# Patient Record
Sex: Female | Born: 1975 | Race: White | Hispanic: No | Marital: Married | Smoking: Current every day smoker
Health system: Southern US, Community
[De-identification: ages and names within clinical notes are randomized; demographics above are authoritative.]

## PROBLEM LIST (undated history)

## (undated) DIAGNOSIS — J449 Chronic obstructive pulmonary disease, unspecified: Secondary | ICD-10-CM

## (undated) DIAGNOSIS — J189 Pneumonia, unspecified organism: Secondary | ICD-10-CM

## (undated) DIAGNOSIS — K7460 Unspecified cirrhosis of liver: Secondary | ICD-10-CM

## (undated) DIAGNOSIS — S8990XA Unspecified injury of unspecified lower leg, initial encounter: Secondary | ICD-10-CM

## (undated) DIAGNOSIS — M1990 Unspecified osteoarthritis, unspecified site: Secondary | ICD-10-CM

## (undated) DIAGNOSIS — F419 Anxiety disorder, unspecified: Secondary | ICD-10-CM

## (undated) DIAGNOSIS — D509 Iron deficiency anemia, unspecified: Secondary | ICD-10-CM

## (undated) DIAGNOSIS — G600 Hereditary motor and sensory neuropathy: Secondary | ICD-10-CM

## (undated) DIAGNOSIS — K3182 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: Secondary | ICD-10-CM

## (undated) DIAGNOSIS — K759 Inflammatory liver disease, unspecified: Secondary | ICD-10-CM

## (undated) DIAGNOSIS — Z972 Presence of dental prosthetic device (complete) (partial): Secondary | ICD-10-CM

## (undated) DIAGNOSIS — K219 Gastro-esophageal reflux disease without esophagitis: Secondary | ICD-10-CM

## (undated) DIAGNOSIS — F1010 Alcohol abuse, uncomplicated: Secondary | ICD-10-CM

## (undated) DIAGNOSIS — D696 Thrombocytopenia, unspecified: Secondary | ICD-10-CM

## (undated) DIAGNOSIS — S3992XA Unspecified injury of lower back, initial encounter: Secondary | ICD-10-CM

## (undated) DIAGNOSIS — C539 Malignant neoplasm of cervix uteri, unspecified: Secondary | ICD-10-CM

## (undated) DIAGNOSIS — S88919A Complete traumatic amputation of unspecified lower leg, level unspecified, initial encounter: Secondary | ICD-10-CM

## (undated) DIAGNOSIS — Z8489 Family history of other specified conditions: Secondary | ICD-10-CM

## (undated) DIAGNOSIS — E876 Hypokalemia: Secondary | ICD-10-CM

## (undated) DIAGNOSIS — I10 Essential (primary) hypertension: Secondary | ICD-10-CM

## (undated) HISTORY — PX: BACK SURGERY: SHX140

## (undated) HISTORY — DX: 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: K31.82

## (undated) HISTORY — PX: LEG SURGERY: SHX1003

## (undated) HISTORY — DX: Unspecified injury of lower back, initial encounter: S39.92XA

## (undated) HISTORY — DX: Hereditary motor and sensory neuropathy: G60.0

## (undated) HISTORY — PX: JOINT REPLACEMENT: SHX530

## (undated) HISTORY — DX: Malignant neoplasm of cervix uteri, unspecified: C53.9

## (undated) HISTORY — DX: Unspecified injury of unspecified lower leg, initial encounter: S89.90XA

---

## 2008-11-21 DIAGNOSIS — Z823 Family history of stroke: Secondary | ICD-10-CM | POA: Insufficient documentation

## 2008-11-21 DIAGNOSIS — Z833 Family history of diabetes mellitus: Secondary | ICD-10-CM | POA: Insufficient documentation

## 2012-10-20 DIAGNOSIS — G988 Other disorders of nervous system: Secondary | ICD-10-CM | POA: Insufficient documentation

## 2013-05-10 DIAGNOSIS — R269 Unspecified abnormalities of gait and mobility: Secondary | ICD-10-CM | POA: Insufficient documentation

## 2013-06-08 HISTORY — PX: BACK SURGERY: SHX140

## 2013-08-28 DIAGNOSIS — M179 Osteoarthritis of knee, unspecified: Secondary | ICD-10-CM | POA: Insufficient documentation

## 2013-08-28 DIAGNOSIS — M1710 Unilateral primary osteoarthritis, unspecified knee: Secondary | ICD-10-CM | POA: Insufficient documentation

## 2014-04-28 DIAGNOSIS — S2241XA Multiple fractures of ribs, right side, initial encounter for closed fracture: Secondary | ICD-10-CM | POA: Insufficient documentation

## 2014-04-28 DIAGNOSIS — S32001A Stable burst fracture of unspecified lumbar vertebra, initial encounter for closed fracture: Secondary | ICD-10-CM | POA: Insufficient documentation

## 2014-04-28 DIAGNOSIS — S82201B Unspecified fracture of shaft of right tibia, initial encounter for open fracture type I or II: Secondary | ICD-10-CM | POA: Insufficient documentation

## 2014-04-28 DIAGNOSIS — F1210 Cannabis abuse, uncomplicated: Secondary | ICD-10-CM | POA: Insufficient documentation

## 2014-04-28 DIAGNOSIS — S2220XA Unspecified fracture of sternum, initial encounter for closed fracture: Secondary | ICD-10-CM | POA: Insufficient documentation

## 2014-05-07 DIAGNOSIS — T07XXXA Unspecified multiple injuries, initial encounter: Secondary | ICD-10-CM | POA: Insufficient documentation

## 2016-03-20 DIAGNOSIS — T847XXA Infection and inflammatory reaction due to other internal orthopedic prosthetic devices, implants and grafts, initial encounter: Secondary | ICD-10-CM | POA: Insufficient documentation

## 2016-05-01 DIAGNOSIS — T8484XA Pain due to internal orthopedic prosthetic devices, implants and grafts, initial encounter: Secondary | ICD-10-CM | POA: Insufficient documentation

## 2016-06-08 HISTORY — PX: BACK SURGERY: SHX140

## 2018-10-02 ENCOUNTER — Other Ambulatory Visit: Payer: Self-pay

## 2018-10-02 ENCOUNTER — Emergency Department
Admission: EM | Admit: 2018-10-02 | Discharge: 2018-10-02 | Disposition: A | Discharge status: Home / Self Care | Payer: Medicaid Other | Attending: Emergency Medicine | Admitting: Emergency Medicine

## 2018-10-02 ENCOUNTER — Encounter: Payer: Self-pay | Admitting: Emergency Medicine

## 2018-10-02 DIAGNOSIS — F1010 Alcohol abuse, uncomplicated: Secondary | ICD-10-CM | POA: Diagnosis not present

## 2018-10-02 DIAGNOSIS — Z789 Other specified health status: Secondary | ICD-10-CM

## 2018-10-02 DIAGNOSIS — Z7289 Other problems related to lifestyle: Secondary | ICD-10-CM

## 2018-10-02 DIAGNOSIS — F1090 Alcohol use, unspecified, uncomplicated: Secondary | ICD-10-CM

## 2018-10-02 DIAGNOSIS — F17200 Nicotine dependence, unspecified, uncomplicated: Secondary | ICD-10-CM | POA: Diagnosis not present

## 2018-10-02 DIAGNOSIS — R748 Abnormal levels of other serum enzymes: Secondary | ICD-10-CM | POA: Diagnosis not present

## 2018-10-02 DIAGNOSIS — I10 Essential (primary) hypertension: Secondary | ICD-10-CM | POA: Diagnosis not present

## 2018-10-02 DIAGNOSIS — R251 Tremor, unspecified: Secondary | ICD-10-CM | POA: Diagnosis present

## 2018-10-02 HISTORY — DX: Essential (primary) hypertension: I10

## 2018-10-02 LAB — CBC WITH DIFFERENTIAL/PLATELET
Abs Immature Granulocytes: 0.02 10*3/uL (ref 0.00–0.07)
Basophils Absolute: 0.1 10*3/uL (ref 0.0–0.1)
Basophils Relative: 1 %
Eosinophils Absolute: 0 10*3/uL (ref 0.0–0.5)
Eosinophils Relative: 0 %
HCT: 38.2 % (ref 36.0–46.0)
Hemoglobin: 13.2 g/dL (ref 12.0–15.0)
Immature Granulocytes: 0 %
Lymphocytes Relative: 33 %
Lymphs Abs: 1.6 10*3/uL (ref 0.7–4.0)
MCH: 33.1 pg (ref 26.0–34.0)
MCHC: 34.6 g/dL (ref 30.0–36.0)
MCV: 95.7 fL (ref 80.0–100.0)
Monocytes Absolute: 0.2 10*3/uL (ref 0.1–1.0)
Monocytes Relative: 5 %
Neutro Abs: 2.9 10*3/uL (ref 1.7–7.7)
Neutrophils Relative %: 61 %
Platelets: 93 10*3/uL — ABNORMAL LOW (ref 150–400)
RBC: 3.99 MIL/uL (ref 3.87–5.11)
RDW: 18.9 % — ABNORMAL HIGH (ref 11.5–15.5)
WBC: 4.9 10*3/uL (ref 4.0–10.5)
nRBC: 0 % (ref 0.0–0.2)

## 2018-10-02 LAB — URINALYSIS, COMPLETE (UACMP) WITH MICROSCOPIC
Bacteria, UA: NONE SEEN
Bilirubin Urine: NEGATIVE
Glucose, UA: NEGATIVE mg/dL
Ketones, ur: 5 mg/dL — AB
Leukocytes,Ua: NEGATIVE
Nitrite: NEGATIVE
Protein, ur: 100 mg/dL — AB
Specific Gravity, Urine: 1.017 (ref 1.005–1.030)
pH: 6 (ref 5.0–8.0)

## 2018-10-02 LAB — COMPREHENSIVE METABOLIC PANEL
ALT: 92 U/L — ABNORMAL HIGH (ref 0–44)
AST: 405 U/L — ABNORMAL HIGH (ref 15–41)
Albumin: 3.6 g/dL (ref 3.5–5.0)
Alkaline Phosphatase: 222 U/L — ABNORMAL HIGH (ref 38–126)
Anion gap: 16 — ABNORMAL HIGH (ref 5–15)
BUN: 6 mg/dL (ref 6–20)
CO2: 22 mmol/L (ref 22–32)
Calcium: 7.5 mg/dL — ABNORMAL LOW (ref 8.9–10.3)
Chloride: 97 mmol/L — ABNORMAL LOW (ref 98–111)
Creatinine, Ser: 0.33 mg/dL — ABNORMAL LOW (ref 0.44–1.00)
GFR calc Af Amer: >60 mL/min (ref >60)
GFR calc non Af Amer: >60 mL/min (ref >60)
Glucose, Bld: 117 mg/dL — ABNORMAL HIGH (ref 70–99)
Potassium: 3.7 mmol/L (ref 3.5–5.1)
Sodium: 135 mmol/L (ref 135–145)
Total Bilirubin: 1.9 mg/dL — ABNORMAL HIGH (ref 0.3–1.2)
Total Protein: 7.9 g/dL (ref 6.5–8.1)

## 2018-10-02 LAB — ETHANOL: Alcohol, Ethyl (B): 219 mg/dL — ABNORMAL HIGH (ref <10)

## 2018-10-02 LAB — URINE DRUG SCREEN, QUALITATIVE (ARMC ONLY)
Amphetamines, Ur Screen: NOT DETECTED
Barbiturates, Ur Screen: NOT DETECTED
Benzodiazepine, Ur Scrn: NOT DETECTED
Cannabinoid 50 Ng, Ur ~~LOC~~: NOT DETECTED
Cocaine Metabolite,Ur ~~LOC~~: NOT DETECTED
MDMA (Ecstasy)Ur Screen: NOT DETECTED
Methadone Scn, Ur: NOT DETECTED
Opiate, Ur Screen: POSITIVE — AB
Phencyclidine (PCP) Ur S: NOT DETECTED
Tricyclic, Ur Screen: NOT DETECTED

## 2018-10-02 LAB — POCT PREGNANCY, URINE: Preg Test, Ur: NEGATIVE

## 2018-10-02 LAB — GLUCOSE, CAPILLARY: Glucose-Capillary: 127 mg/dL — ABNORMAL HIGH (ref 70–99)

## 2018-10-02 MED ORDER — SODIUM CHLORIDE 0.9 % IV BOLUS
1000.0000 mL | Freq: Once | INTRAVENOUS | Status: AC
  Administered 2018-10-02: 1000 mL via INTRAVENOUS

## 2018-10-02 NOTE — ED Notes (Signed)
Lab called and stated red top had hemolyzed- redrew it and sent to lab

## 2018-10-02 NOTE — ED Notes (Signed)
Pt tremors have minimized

## 2018-10-02 NOTE — ED Triage Notes (Signed)
Pt to ED via POV c/o tremors that started this morning around 1130. Pt denies drug use. Pt states that she just "feels horrible". Pt states that she has hx/o HTN.

## 2018-10-02 NOTE — ED Provider Notes (Signed)
[facility] Emergency Department Provider Note  ____________________________________________   I have reviewed the triage vital signs and the nursing notes.   HISTORY  Chief Complaint Shaking   History limited by: Not Limited   HPI Rhonda Martin is a 43 y.o. female who presents to the emergency department today because of concern for uncontrollable shaking. The patient states that the shaking has happened a couple of times in the past but today was severe. She is unsure if it is related to anxiety. At one point she had been on a benzo for anxiety but took herself off of it given concern for addiction. She also states that she has been drinking roughly a pint of alcohol a day. She denies any recent illness or fever. At the time of my exam the patient feels like the shaking has improved.   Records reviewed. Per medical record review patient has a history of hypertension.   Past Medical History:  Diagnosis Date  . Hypertension     There are no active problems to display for this patient.   Past Surgical History:  Procedure Laterality Date  . BACK SURGERY      Prior to Admission medications   Not on File    Allergies Patient has no known allergies.  No family history on file.  Social History Social History   Tobacco Use  . Smoking status: Current Every Day Smoker  . Smokeless tobacco: Never Used  Substance Use Topics  . Alcohol use: Yes    Comment: 1 pint per day drinker. Last drink around 0300  . Drug use: Not Currently    Review of Systems Constitutional: No fever/chills Eyes: No visual changes. ENT: No sore throat. Cardiovascular: Denies chest pain. Respiratory: Denies shortness of breath. Gastrointestinal: No abdominal pain.  No nausea, no vomiting.  No diarrhea.  Genitourinary: Negative for dysuria. Musculoskeletal: Negative for back pain. Skin: Negative for rash. Neurological: Positive for generalized shaking.   ____________________________________________   PHYSICAL EXAM:  VITAL SIGNS: ED Triage Vitals  Enc Vitals Group     BP 10/02/18 1625 (!) 160/99     Pulse Rate 10/02/18 1625 (!) 122     Resp 10/02/18 1625 (!) 21     Temp --      Temp src --      SpO2 10/02/18 1625 98 %     Weight --      Height --      Head Circumference --      Peak Flow --      Pain Score 10/02/18 1622 0   Constitutional: Alert and oriented.  Eyes: Conjunctivae are normal.  ENT      Head: Normocephalic and atraumatic.      Nose: No congestion/rhinnorhea.      Mouth/Throat: Mucous membranes are moist.      Neck: No stridor. Hematological/Lymphatic/Immunilogical: No cervical lymphadenopathy. Cardiovascular: Normal rate, regular rhythm.  No murmurs, rubs, or gallops.  Respiratory: Normal respiratory effort without tachypnea nor retractions. Breath sounds are clear and equal bilaterally. No wheezes/rales/rhonchi. Gastrointestinal: Soft and non tender. No rebound. No guarding.  Genitourinary: Deferred Musculoskeletal: Normal range of motion in all extremities. No lower extremity edema. Neurologic:  Normal speech and language. No gross focal neurologic deficits are appreciated.  Skin:  Skin is warm, dry and intact. No rash noted. Psychiatric: Mood and affect are normal. Speech and behavior are normal. Patient exhibits appropriate insight and judgment.  ____________________________________________    LABS (pertinent positives/negatives)  POCT  urine negative CMP na 135, k 3.7, cl 97, cr 0.33, ca 7.5, ast 405, alt 92, alk phos 222, t bili 1.9 UDS positive opiate Upreg negative Ethanol 219 UA small hgb dipstick, 0-5 rbc and wbc CBC wbc 4.9, hgb 13.2, plt 93 ____________________________________________   EKG  None  ____________________________________________     RADIOLOGY  None  ____________________________________________   PROCEDURES  Procedures  ____________________________________________   INITIAL IMPRESSION / ASSESSMENT AND PLAN / ED COURSE  Pertinent labs & imaging results that were available during my care of the patient were reviewed by me and considered in my medical decision making (see chart for details).   Patient presented to the emergency department today because of concerns for full body shaking that started today although she has had similar episodes in the past.  By the time my exam there is no tremors or shaking.  She does state that she thinks might be related to anxiety and also states she has had a fairly significant alcohol intake recently.  Blood work does show elevated liver enzymes.  I did discussion with the patient about this.  I do have concern this is secondary to her alcohol use.  Did encourage cessation.  Discussed importance of GI follow-up.  Patient's heart rate initially was tachycardic although that improved during her stay emergency department and with IV fluids.  At this point I doubt significant neurologic process.  Will discharge.  ____________________________________________   FINAL CLINICAL IMPRESSION(S) / ED DIAGNOSES  Final diagnoses:  Shaking  Elevated liver enzymes     Note: This dictation was prepared with Dragon dictation. Any transcriptional errors that result from this process are unintentional     Nance Pear, MD 10/02/18 [id number]

## 2018-10-02 NOTE — Discharge Instructions (Addendum)
As we discussed it is important that you see the GI doctor to discuss your elevated liver values. Please stop using alcohol. Please seek medical attention for any high fevers, chest pain, shortness of breath, change in behavior, persistent vomiting, bloody stool or any other new or concerning symptoms.
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
## 2018-10-02 NOTE — ED Notes (Signed)
Assisted pt to void

## 2018-11-23 ENCOUNTER — Ambulatory Visit: Payer: Self-pay | Admitting: *Deleted

## 2018-11-23 NOTE — Telephone Encounter (Signed)
New pt appt scheduled.

## 2018-11-23 NOTE — Telephone Encounter (Signed)
New to the area, not established. Noticed rectal bleeding last night. Normal stool yesterday with no abdominal pain/N/V/fever. Hx diverticulitis and ulcers. Was under the care of Gastroenterologist in [place]. Endoscopy and Colonoscopy in 2011. Seeking to establish in this area for future healthcare. Transferred to [facility] for appointment. Denies any travels/no known exposures.  Reason for Disposition . MILD rectal bleeding (more than just a few drops or streaks)  Answer Assessment - Initial Assessment Questions 1. APPEARANCE of BLOOD: "What color is it?" "Is it passed separately, on the surface of the stool, or mixed in with the stool?"      Bright red blood on tissue after BM last night.  2. AMOUNT: "How much blood was passed?"      Fair amount on nice 3. FREQUENCY: "How many times has blood been passed with the stools?"      Once last night. Several times before over the last few months. 4. ONSET: "When was the blood first seen in the stools?" (Days or weeks)       5. DIARRHEA: "Is there also some diarrhea?" If so, ask: "How many diarrhea stools were passed in past 24 hours?"      Hard stool last night 6. CONSTIPATION: "Do you have constipation?" If so, "How bad is it?"    Mild constipation 7. RECURRENT SYMPTOMS: "Have you had blood in your stools before?" If so, ask: "When was the last time?" and "What happened that time?"      Yes, about one month ago 8. BLOOD THINNERS: "Do you take any blood thinners?" (e.g., Coumadin/warfarin, Pradaxa/dabigatran, aspirin)     no 9. OTHER SYMPTOMS: "Do you have any other symptoms?"  (e.g., abdominal pain, vomiting, dizziness, fever)     No abdominal pain, no n/v 10. PREGNANCY: "Is there any chance you are pregnant?" "When was your last menstrual period?"       no  Protocols used: RECTAL BLEEDING-A-AH

## 2018-11-30 ENCOUNTER — Other Ambulatory Visit: Payer: Self-pay

## 2018-11-30 ENCOUNTER — Encounter: Payer: Self-pay | Admitting: Family Medicine

## 2018-11-30 ENCOUNTER — Ambulatory Visit: Payer: Medicaid Other | Admitting: Family Medicine

## 2018-11-30 VITALS — BP 127/83 | HR 99 | Temp 98.5°F | Ht 61.5 in | Wt 118.0 lb

## 2018-11-30 DIAGNOSIS — Z8719 Personal history of other diseases of the digestive system: Secondary | ICD-10-CM

## 2018-11-30 DIAGNOSIS — Z7689 Persons encountering health services in other specified circumstances: Secondary | ICD-10-CM | POA: Diagnosis not present

## 2018-11-30 DIAGNOSIS — G600 Hereditary motor and sensory neuropathy: Secondary | ICD-10-CM

## 2018-11-30 DIAGNOSIS — R7989 Other specified abnormal findings of blood chemistry: Secondary | ICD-10-CM

## 2018-11-30 DIAGNOSIS — K921 Melena: Secondary | ICD-10-CM

## 2018-11-30 DIAGNOSIS — D696 Thrombocytopenia, unspecified: Secondary | ICD-10-CM

## 2018-11-30 DIAGNOSIS — Z8711 Personal history of peptic ulcer disease: Secondary | ICD-10-CM

## 2018-11-30 DIAGNOSIS — M25561 Pain in right knee: Secondary | ICD-10-CM

## 2018-11-30 DIAGNOSIS — R1030 Lower abdominal pain, unspecified: Secondary | ICD-10-CM

## 2018-11-30 DIAGNOSIS — G8929 Other chronic pain: Secondary | ICD-10-CM

## 2018-11-30 DIAGNOSIS — R945 Abnormal results of liver function studies: Secondary | ICD-10-CM

## 2018-11-30 MED ORDER — GABAPENTIN 300 MG PO CAPS: ORAL_CAPSULE | ORAL | 0 refills | Status: DC

## 2018-11-30 MED ORDER — PANTOPRAZOLE SODIUM 40 MG PO TBEC: 40.0000 mg | DELAYED_RELEASE_TABLET | Freq: Every day | ORAL | 3 refills | Status: DC

## 2018-11-30 MED ORDER — SUCRALFATE 1 G PO TABS: 1.0000 g | ORAL_TABLET | Freq: Three times a day (TID) | ORAL | 1 refills | Status: DC

## 2018-11-30 NOTE — Progress Notes (Signed)
BP 127/83   Pulse 99   Temp 98.5 F (36.9 C) (Oral)   Ht 5' 1.5" (1.562 m)   Wt 118 lb (53.5 kg)   SpO2 98%   BMI 21.93 kg/m    Subjective:    [name], female    DOB: 20-Jul-1975, 43 y.o.   MRN: [id number]  HPI: Rhonda Martin is a 43 y.o. female  Chief Complaint  Patient presents with  . Establish Care  . Abdominal Pain    lower abdomen. states has seen blood in stool    Here today to establish care.   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 Was followed by [facility] in [facility] her entire childhood into early adulthood. Takes aleve for pain relief daily. Has also broken her back in the past and has had surgery for this, seems to be fairly stable at this time.   States she needs a right knee replacement, unrepaired torn ACL in the past and severe OA. Has seen [facility] for this in the past but life events kept pushing surgery back. Currently in severe pain and falling often because of this.   Brings with her today some recent labs from provider in [place] which show that her platelets have been in the 70s for the past 6-12 months with last read being 10/2018. Also has elevated LFTs through that time. No known hx of liver disease. Denies hx of IV drug use. Used to drink about a 5th of liquor daily x about 2 years due to pain, still drinks but not quite that much.   Has been on gabapentin in the past which seemed to not help much. Willing to try it again as that was years ago.   Has been noticing bloody stools and lower abdominal throbbing pain worst when laying down at night x several days. This tends to happen episodically for her more often than not. States she frequently has N/V. Has had diverticulitis and gastric ulcers in the past. Also has chronic low platelets which has not been worked up. Denies rectal pain, fevers, known hemorrhoids, diarrhea, constipation. Has not been eating much but keeps down most of what she  does eat.   Relevant past medical, surgical, family and social history reviewed and updated as indicated. Interim medical history since our last visit reviewed. Allergies and medications reviewed and updated.  Review of Systems  Per HPI unless specifically indicated above     Objective:    BP 127/83   Pulse 99   Temp 98.5 F (36.9 C) (Oral)   Ht 5' 1.5" (1.562 m)   Wt 118 lb (53.5 kg)   SpO2 98%   BMI 21.93 kg/m   Wt Readings from Last 3 Encounters:  11/30/18 118 lb (53.5 kg)    Physical Exam Vitals signs and nursing note reviewed.  Constitutional:      Appearance: Normal appearance. She is not ill-appearing.  HENT:     Head: Atraumatic.  Eyes:     Extraocular Movements: Extraocular movements intact.     Conjunctiva/sclera: Conjunctivae normal.  Neck:     Musculoskeletal: Normal range of motion and neck supple.  Cardiovascular:     Rate and Rhythm: Normal rate and regular rhythm.     Heart sounds: Normal heart sounds.  Pulmonary:     Effort: Pulmonary effort is normal.     Breath sounds: Normal breath sounds.  Abdominal:     General: Bowel sounds are  normal. There is no distension.     Palpations: Abdomen is soft. There is no mass.     Tenderness: There is abdominal tenderness (mild sporadic ttp). There is no right CVA tenderness, left CVA tenderness, guarding or rebound.  Genitourinary:    Comments: Declines rectal exam today Musculoskeletal: Normal range of motion.  Skin:    General: Skin is warm and dry.  Neurological:     Mental Status: She is alert and oriented to person, place, and time.  Psychiatric:        Mood and Affect: Mood normal.        Thought Content: Thought content normal.        Judgment: Judgment normal.     Results for orders placed or performed in visit on 11/30/18  Microscopic Examination   URINE  Result Value Ref Range   WBC, UA 0-5 0 - 5 /hpf   RBC 0-2 0 - 2 /hpf   Epithelial Cells (non renal) 0-10 0 - 10 /hpf   Casts Present  None seen /lpf   Cast Type Granular casts (A) N/A   Mucus, UA Present Not Estab.   Bacteria, UA Few (A) None seen/Few  UA/M w/rflx Culture, Routine   Specimen: Urine   URINE  Result Value Ref Range   Specific Gravity, UA 1.025 1.005 - 1.030   pH, UA 6.0 5.0 - 7.5   Color, UA Yellow Yellow   Appearance Ur Clear Clear   Leukocytes,UA Negative Negative   Protein,UA 3+ (A) Negative/Trace   Glucose, UA Negative Negative   Ketones, UA 1+ (A) Negative   RBC, UA 1+ (A) Negative   Bilirubin, UA Negative Negative   Urobilinogen, Ur >8.0 (H) 0.2 - 1.0 mg/dL   Nitrite, UA Negative Negative   Microscopic Examination See below:   Comprehensive metabolic panel  Result Value Ref Range   Glucose 82 65 - 99 mg/dL   BUN 7 6 - 24 mg/dL   Creatinine, Ser 0.46 (L) 0.57 - 1.00 mg/dL   GFR calc non Af Amer 123 >59 mL/min/1.73   GFR calc Af Amer 142 >59 mL/min/1.73   BUN/Creatinine Ratio 15 9 - 23   Sodium 141 134 - 144 mmol/L   Potassium 3.6 3.5 - 5.2 mmol/L   Chloride 99 96 - 106 mmol/L   CO2 19 (L) 20 - 29 mmol/L   Calcium 8.0 (L) 8.7 - 10.2 mg/dL   Total Protein 7.6 6.0 - 8.5 g/dL   Albumin 4.1 3.8 - 4.8 g/dL   Globulin, Total 3.5 1.5 - 4.5 g/dL   Albumin/Globulin Ratio 1.2 1.2 - 2.2   Bilirubin Total 0.8 0.0 - 1.2 mg/dL   Alkaline Phosphatase 187 (H) 39 - 117 IU/L   AST 283 (H) 0 - 40 IU/L   ALT 61 (H) 0 - 32 IU/L  CBC with Differential/Platelet  Result Value Ref Range   WBC 4.6 3.4 - 10.8 x10E3/uL   RBC 3.72 (L) 3.77 - 5.28 x10E6/uL   Hemoglobin 12.6 11.1 - 15.9 g/dL   Hematocrit 35.8 34.0 - 46.6 %   MCV 96 79 - 97 fL   MCH 33.9 (H) 26.6 - 33.0 pg   MCHC 35.2 31.5 - 35.7 g/dL   RDW 16.2 (H) 11.7 - 15.4 %   Platelets 82 (LL) 150 - 450 x10E3/uL   Neutrophils 58 Not Estab. %   Lymphs 32 Not Estab. %   Monocytes 6 Not Estab. %   Eos 1 Not Estab. %  Basos 3 Not Estab. %   Neutrophils Absolute 2.7 1.4 - 7.0 x10E3/uL   Lymphocytes Absolute 1.5 0.7 - 3.1 x10E3/uL   Monocytes  Absolute 0.3 0.1 - 0.9 x10E3/uL   EOS (ABSOLUTE) 0.0 0.0 - 0.4 x10E3/uL   Basophils Absolute 0.1 0.0 - 0.2 x10E3/uL   Immature Granulocytes 0 Not Estab. %   Immature Grans (Abs) 0.0 0.0 - 0.1 x10E3/uL   Hematology Comments: Note:       Assessment & Plan:   Problem List Items Addressed This Visit      Nervous and Auditory   Charcot-Marie-Tooth disease    Will restart gabapentin, particularly as she is unable to take NSAIDs or tylenol currently d/t GI bleeding of unknown origin and significantly elevated LFTs currently. Declines Neurology referral      Relevant Medications   gabapentin (NEURONTIN) 300 MG capsule    Other Visit Diagnoses    Thrombocytopenia ([place])    -  Primary   Referral placed to hematology for ongoing thrombocytopenia   Relevant Orders   Ambulatory referral to Hematology   CBC With Differential/Platelet   Ambulatory referral to Gastroenterology   Encounter to establish care       Chronic pain of right knee       Referral placed to orthopedics to discuss moving forward with knee surgery due to her severe pain and limitations related to this. D/c aleve   Relevant Medications   gabapentin (NEURONTIN) 300 MG capsule   Other Relevant Orders   AMB referral to orthopedics   Elevated LFTs       Will recheck levels, avoid alcohol, tylenol, etc.    Relevant Orders   Comprehensive metabolic panel (Completed)   Ambulatory referral to Gastroenterology   Lower abdominal pain       Afebrile, exam fairly benign today. Will obtain labs and refer to GI given episodic ongoing nature. Strict ER precautions given if worsening   Relevant Orders   UA/M w/rflx Culture, Routine (Completed)   Hematochezia       D/c NSAIDs, refer urgently to GI for this and several other reasons   Relevant Orders   Ambulatory referral to Gastroenterology   History of gastric ulcer       Given pain and bleeding, will start carafate and protonix in case dealing with ulcers though sxs are seemingly  lower abdominal in nature       Follow up plan: Return for CPE.

## 2018-12-01 ENCOUNTER — Telehealth: Payer: Self-pay | Admitting: Family Medicine

## 2018-12-01 DIAGNOSIS — G600 Hereditary motor and sensory neuropathy: Secondary | ICD-10-CM | POA: Insufficient documentation

## 2018-12-01 LAB — UA/M W/RFLX CULTURE, ROUTINE
Bilirubin, UA: NEGATIVE
Glucose, UA: NEGATIVE
Leukocytes,UA: NEGATIVE
Nitrite, UA: NEGATIVE
Specific Gravity, UA: 1.025 (ref 1.005–1.030)
Urobilinogen, Ur: >8 mg/dL — ABNORMAL HIGH (ref 0.2–1.0)
pH, UA: 6 (ref 5.0–7.5)

## 2018-12-01 LAB — CBC WITH DIFFERENTIAL/PLATELET
Basophils Absolute: 0.1 10*3/uL (ref 0.0–0.2)
Basos: 3 %
EOS (ABSOLUTE): 0 10*3/uL (ref 0.0–0.4)
Eos: 1 %
Hematocrit: 35.8 % (ref 34.0–46.6)
Hemoglobin: 12.6 g/dL (ref 11.1–15.9)
Immature Grans (Abs): 0 10*3/uL (ref 0.0–0.1)
Immature Granulocytes: 0 %
Lymphocytes Absolute: 1.5 10*3/uL (ref 0.7–3.1)
Lymphs: 32 %
MCH: 33.9 pg — ABNORMAL HIGH (ref 26.6–33.0)
MCHC: 35.2 g/dL (ref 31.5–35.7)
MCV: 96 fL (ref 79–97)
Monocytes Absolute: 0.3 10*3/uL (ref 0.1–0.9)
Monocytes: 6 %
Neutrophils Absolute: 2.7 10*3/uL (ref 1.4–7.0)
Neutrophils: 58 %
Platelets: 82 10*3/uL — CL (ref 150–450)
RBC: 3.72 x10E6/uL — ABNORMAL LOW (ref 3.77–5.28)
RDW: 16.2 % — ABNORMAL HIGH (ref 11.7–15.4)
WBC: 4.6 10*3/uL (ref 3.4–10.8)

## 2018-12-01 LAB — MICROSCOPIC EXAMINATION

## 2018-12-01 LAB — COMPREHENSIVE METABOLIC PANEL
ALT: 61 IU/L — ABNORMAL HIGH (ref 0–32)
AST: 283 IU/L — ABNORMAL HIGH (ref 0–40)
Albumin/Globulin Ratio: 1.2 (ref 1.2–2.2)
Albumin: 4.1 g/dL (ref 3.8–4.8)
Alkaline Phosphatase: 187 IU/L — ABNORMAL HIGH (ref 39–117)
BUN/Creatinine Ratio: 15 (ref 9–23)
BUN: 7 mg/dL (ref 6–24)
Bilirubin Total: 0.8 mg/dL (ref 0.0–1.2)
CO2: 19 mmol/L — ABNORMAL LOW (ref 20–29)
Calcium: 8 mg/dL — ABNORMAL LOW (ref 8.7–10.2)
Chloride: 99 mmol/L (ref 96–106)
Creatinine, Ser: 0.46 mg/dL — ABNORMAL LOW (ref 0.57–1.00)
GFR calc Af Amer: 142 mL/min/{1.73_m2} (ref >59)
GFR calc non Af Amer: 123 mL/min/{1.73_m2} (ref >59)
Globulin, Total: 3.5 g/dL (ref 1.5–4.5)
Glucose: 82 mg/dL (ref 65–99)
Potassium: 3.6 mmol/L (ref 3.5–5.2)
Sodium: 141 mmol/L (ref 134–144)
Total Protein: 7.6 g/dL (ref 6.0–8.5)

## 2018-12-01 NOTE — Telephone Encounter (Signed)
Tried calling patient multiple times this morning, doesn't ring and voicemail box not set up. She has multiple significant lab abnormalities that are consistent with other recent labs (low platelets, elevated LFTs) and I've placed an urgent GI referral to evaluate her LFTs and her abdominal pain/bloody stools

## 2018-12-01 NOTE — Assessment & Plan Note (Addendum)
Will restart gabapentin, particularly as she is unable to take NSAIDs or tylenol currently d/t GI bleeding of unknown origin and significantly elevated LFTs currently. Declines Neurology referral

## 2018-12-01 NOTE — Telephone Encounter (Signed)
Attempted to reach pt and emergency contact again. Was able to leave VM on emergency contacts number this time.

## 2018-12-01 NOTE — Telephone Encounter (Signed)
Attempted to reach emergency contact as well. Someone answered line and d/c w/o speaking. Will attempt to reach again shortly.

## 2018-12-02 NOTE — Telephone Encounter (Signed)
Patient notified of results and notified of Gastro Appointment with Tammi Klippel at [facility] 06/30 at 3:00. Address and phone number given. Told patient to arrive around 2:30 for her appointment.  She verbalized understanding.  Routing to provider as FYI only.

## 2018-12-07 ENCOUNTER — Other Ambulatory Visit: Payer: Self-pay

## 2018-12-07 ENCOUNTER — Telehealth: Payer: Self-pay

## 2018-12-07 NOTE — Telephone Encounter (Signed)
[facility] called - referral was declined by provider. Please let me know if you'd like to send this elsewhere

## 2018-12-08 ENCOUNTER — Inpatient Hospital Stay: Payer: Medicaid Other | Attending: Oncology | Admitting: Oncology

## 2018-12-08 ENCOUNTER — Inpatient Hospital Stay: Payer: Medicaid Other

## 2018-12-08 ENCOUNTER — Other Ambulatory Visit: Payer: Self-pay

## 2018-12-08 ENCOUNTER — Encounter: Payer: Self-pay | Admitting: Oncology

## 2018-12-08 ENCOUNTER — Other Ambulatory Visit
Admission: RE | Admit: 2018-12-08 | Discharge: 2018-12-08 | Disposition: A | Discharge status: Home / Self Care | Payer: Medicaid Other | Source: Ambulatory Visit | Attending: Oncology | Admitting: Oncology

## 2018-12-08 DIAGNOSIS — R768 Other specified abnormal immunological findings in serum: Secondary | ICD-10-CM | POA: Insufficient documentation

## 2018-12-08 DIAGNOSIS — R740 Nonspecific elevation of levels of transaminase and lactic acid dehydrogenase [LDH]: Secondary | ICD-10-CM | POA: Diagnosis present

## 2018-12-08 DIAGNOSIS — R7401 Elevation of levels of liver transaminase levels: Secondary | ICD-10-CM

## 2018-12-08 DIAGNOSIS — R233 Spontaneous ecchymoses: Secondary | ICD-10-CM

## 2018-12-08 DIAGNOSIS — F17210 Nicotine dependence, cigarettes, uncomplicated: Secondary | ICD-10-CM | POA: Insufficient documentation

## 2018-12-08 DIAGNOSIS — B182 Chronic viral hepatitis C: Secondary | ICD-10-CM | POA: Insufficient documentation

## 2018-12-08 DIAGNOSIS — G600 Hereditary motor and sensory neuropathy: Secondary | ICD-10-CM | POA: Insufficient documentation

## 2018-12-08 DIAGNOSIS — K921 Melena: Secondary | ICD-10-CM | POA: Insufficient documentation

## 2018-12-08 DIAGNOSIS — D696 Thrombocytopenia, unspecified: Secondary | ICD-10-CM | POA: Diagnosis not present

## 2018-12-08 DIAGNOSIS — F1010 Alcohol abuse, uncomplicated: Secondary | ICD-10-CM | POA: Diagnosis not present

## 2018-12-08 DIAGNOSIS — R238 Other skin changes: Secondary | ICD-10-CM | POA: Insufficient documentation

## 2018-12-08 DIAGNOSIS — R5383 Other fatigue: Secondary | ICD-10-CM | POA: Insufficient documentation

## 2018-12-08 DIAGNOSIS — R1013 Epigastric pain: Secondary | ICD-10-CM

## 2018-12-08 DIAGNOSIS — E538 Deficiency of other specified B group vitamins: Secondary | ICD-10-CM | POA: Insufficient documentation

## 2018-12-08 DIAGNOSIS — Z720 Tobacco use: Secondary | ICD-10-CM

## 2018-12-08 LAB — CBC WITH DIFFERENTIAL/PLATELET
Abs Immature Granulocytes: 0.01 10*3/uL (ref 0.00–0.07)
Basophils Absolute: 0.1 10*3/uL (ref 0.0–0.1)
Basophils Relative: 2 %
Eosinophils Absolute: 0 10*3/uL (ref 0.0–0.5)
Eosinophils Relative: 1 %
HCT: 33 % — ABNORMAL LOW (ref 36.0–46.0)
Hemoglobin: 11.5 g/dL — ABNORMAL LOW (ref 12.0–15.0)
Immature Granulocytes: 0 %
Lymphocytes Relative: 36 %
Lymphs Abs: 1.2 10*3/uL (ref 0.7–4.0)
MCH: 34 pg (ref 26.0–34.0)
MCHC: 34.8 g/dL (ref 30.0–36.0)
MCV: 97.6 fL (ref 80.0–100.0)
Monocytes Absolute: 0.3 10*3/uL (ref 0.1–1.0)
Monocytes Relative: 10 %
Neutro Abs: 1.7 10*3/uL (ref 1.7–7.7)
Neutrophils Relative %: 51 %
Platelets: 62 10*3/uL — ABNORMAL LOW (ref 150–400)
RBC: 3.38 MIL/uL — ABNORMAL LOW (ref 3.87–5.11)
RDW: 19.4 % — ABNORMAL HIGH (ref 11.5–15.5)
WBC: 3.3 10*3/uL — ABNORMAL LOW (ref 4.0–10.5)
nRBC: 0 % (ref 0.0–0.2)

## 2018-12-08 LAB — PROTIME-INR
INR: 1 (ref 0.8–1.2)
Prothrombin Time: 13.5 seconds (ref 11.4–15.2)

## 2018-12-08 LAB — APTT: aPTT: 33 seconds (ref 24–36)

## 2018-12-08 LAB — FOLATE: Folate: 3.1 ng/mL — ABNORMAL LOW (ref >5.9)

## 2018-12-08 LAB — TSH: TSH: 1.947 u[IU]/mL (ref 0.350–4.500)

## 2018-12-08 LAB — TECHNOLOGIST SMEAR REVIEW

## 2018-12-08 LAB — VITAMIN B12: Vitamin B-12: 497 pg/mL (ref 180–914)

## 2018-12-08 LAB — IMMATURE PLATELET FRACTION: Immature Platelet Fraction: 8.4 % (ref 1.2–8.6)

## 2018-12-08 LAB — LACTATE DEHYDROGENASE: LDH: 313 U/L — ABNORMAL HIGH (ref 98–192)

## 2018-12-08 NOTE — Telephone Encounter (Signed)
Yes, please.

## 2018-12-08 NOTE — Progress Notes (Signed)
Called patient for Telehealth visit via [place].  Patient c/o diarrhea with blood in stoll from diverticulitis.

## 2018-12-08 NOTE — Progress Notes (Addendum)
Dear [name]  I had the pleasure seeing Rhonda Martin  for evaluation of thrombocytopenia. Attached is a copy of today's clinic visit note.    Thank you for this kind referral and the opportunity to involve me in participating in the care of this patient. Please do not hesitate to call me if any questions or concerns.    Earlie Server MD PhD    HEMATOLOGY-ONCOLOGY TeleHEALTH VISIT INITIAL CONSULTATION  I connected with Rhonda Martin on 12/08/18 at  9:30 AM EDT by video enabled telemedicine visit and verified that I am speaking with the correct person using two identifiers. I discussed the limitations, risks, security and privacy concerns of performing an evaluation and management service by telemedicine and the availability of in-person appointments. I also discussed with the patient that there may be a patient responsible charge related to this service. The patient expressed understanding and agreed to proceed.   Other persons participating in the visit and their role in the encounter:  Geraldine Solar, [place], check in patient     Patient's location: Home  Provider's location: Home office  Referring provider: Volney American,*  Chief Complaint: Initial consultation for thrombocytopenia evaluation  HISTORY OF PRESENT ILLNESS Rhonda Martin is a 43 y.o. female who was seen in consultation at the request of Volney American,* for evaluation of thrombocytopenia Patient recently moved from [place] to [facility].  SHe has a history of Charcot-Marie-Tooth disease She informs me that she was previously told that she low platelet counts before.  reports easy bruising.  Showed me a bruise on her thigh. Today she feels very tired and fatigued, feels like she is almost going to pass out.  Multiple complaints today.  Occasionally she sees bright red blood in the stool.  No fever, chills,.  Chronic pain, she takes Aleve or naproxen with some relief.  Also reports stomach pain, associated  with.  Acid reflux symptoms in the morning.  No exacerbating or alleviating factors. She drinks alcohol every day.  Hard liquor. Current every day smoker.  Extensive medical record review was performed  Review of Systems  Constitutional: Positive for fatigue. Negative for chills and fever.  HENT:   Negative for hearing loss and voice change.   Eyes: Negative for eye problems.  Respiratory: Negative for chest tightness and cough.   Cardiovascular: Negative for chest pain.  Gastrointestinal: Positive for abdominal pain. Negative for abdominal distention and blood in stool.  Endocrine: Negative for hot flashes.  Genitourinary: Negative for difficulty urinating and frequency.   Musculoskeletal: Negative for arthralgias.       Chronic diffuse body pain  Skin: Negative for itching and rash.  Neurological: Negative for extremity weakness.  Hematological: Negative for adenopathy. Bruises/bleeds easily.  Psychiatric/Behavioral: Negative for confusion.    Past Medical History:  Diagnosis Date  . Back injury   . Cervical cancer ([place])   . Charcot-Marie-Tooth disease   . Hypertension   . Leg injury    Past Surgical History:  Procedure Laterality Date  . BACK SURGERY    . LEG SURGERY      Family History  Problem Relation Age of Onset  . Diabetes Mother   . Hypertension Mother   . Cancer Father        unknown what kind of cancer   . Hypertension Sister   . Hypertension Brother   . Heart attack Brother     Social History   Socioeconomic History  . Marital status: Divorced  Spouse name: Not on file  . Number of children: Not on file  . Years of education: Not on file  . Highest education level: Not on file  Occupational History  . Not on file  Social Needs  . Financial resource strain: Not on file  . Food insecurity    Worry: Not on file    Inability: Not on file  . Transportation needs    Medical: Not on file    Non-medical: Not on file  Tobacco Use  . Smoking  status: Current Every Day Smoker    Packs/day: 0.25    Years: 30.00    Pack years: 7.50    Types: Cigarettes  . Smokeless tobacco: Never Used  Substance and Sexual Activity  . Alcohol use: Yes    Alcohol/week: 5.0 standard drinks    Types: 5 Shots of liquor per week    Comment: 1 pint per day drinker. Last drink around 0300  . Drug use: Not Currently  . Sexual activity: Not Currently    Birth control/protection: I.U.D.  Lifestyle  . Physical activity    Days per week: Not on file    Minutes per session: Not on file  . Stress: Not on file  Relationships  . Social Herbalist on phone: Not on file    Gets together: Not on file    Attends religious service: Not on file    Active member of club or organization: Not on file    Attends meetings of clubs or organizations: Not on file    Relationship status: Not on file  . Intimate partner violence    Fear of current or ex partner: Not on file    Emotionally abused: Not on file    Physically abused: Not on file    Forced sexual activity: Not on file  Other Topics Concern  . Not on file  Social History Narrative  . Not on file    Current Outpatient Medications on File Prior to Visit  Medication Sig Dispense Refill  . naproxen sodium (ALEVE) 220 MG tablet Take 220 mg by mouth.    . sucralfate (CARAFATE) 1 g tablet Take 1 tablet (1 g total) by mouth 4 (four) times daily -  with meals and at bedtime. 120 tablet 1  . pantoprazole (PROTONIX) 40 MG tablet Take 1 tablet (40 mg total) by mouth daily. (Patient not taking: Reported on 12/08/2018) 30 tablet 3   No current facility-administered medications on file prior to visit.     No Known Allergies     Observations/Objective: There were no vitals filed for this visit. There is no height or weight on file to calculate BMI.  Physical Exam  Constitutional: She is oriented to person, place, and time. No distress.  Neurological: She is alert and oriented to person, place, and  time.  Skin:  4 cm bruising right thigh.  Psychiatric: Affect normal.    I have personally reviewed below laboratory results.  CBC    Component Value Date/Time   WBC 4.6 11/30/2018 1651   WBC 4.9 10/02/2018 1636   RBC 3.72 (L) 11/30/2018 1651   RBC 3.99 10/02/2018 1636   HGB 12.6 11/30/2018 1651   HCT 35.8 11/30/2018 1651   PLT 82 (LL) 11/30/2018 1651   MCV 96 11/30/2018 1651   MCH 33.9 (H) 11/30/2018 1651   MCH 33.1 10/02/2018 1636   MCHC 35.2 11/30/2018 1651   MCHC 34.6 10/02/2018 1636   RDW 16.2 (H)  11/30/2018 1651   LYMPHSABS 1.5 11/30/2018 1651   MONOABS 0.2 10/02/2018 1636   EOSABS 0.0 11/30/2018 1651   BASOSABS 0.1 11/30/2018 1651    CMP     Component Value Date/Time   NA 141 11/30/2018 1651   K 3.6 11/30/2018 1651   CL 99 11/30/2018 1651   CO2 19 (L) 11/30/2018 1651   GLUCOSE 82 11/30/2018 1651   GLUCOSE 117 (H) 10/02/2018 1636   BUN 7 11/30/2018 1651   CREATININE 0.46 (L) 11/30/2018 1651   CALCIUM 8.0 (L) 11/30/2018 1651   PROT 7.6 11/30/2018 1651   ALBUMIN 4.1 11/30/2018 1651   AST 283 (H) 11/30/2018 1651   ALT 61 (H) 11/30/2018 1651   ALKPHOS 187 (H) 11/30/2018 1651   BILITOT 0.8 11/30/2018 1651   GFRNONAA 123 11/30/2018 1651   GFRAA 142 11/30/2018 1651     RADIOGRAPHIC STUDIES: I have personally reviewed the radiological images as listed and agreed with the findings in the report.  No results found.  Assessment and Plan: 1. Thrombocytopenia (HCC)   2. Epigastric pain   3. Tobacco abuse   4. Alcohol abuse   5. Transaminitis   6. Other fatigue   7. Easy bruising     Labs are reviewed and discussed with patient.   Also reviewed previous CT scanning done in February 2020 at outside facility. CBC reviewed thrombocytopenia For the work up of patient's thrombocytopenia, I recommend checking CBC;CMP, LDH; smear review, folate, Vitamin B12, hepatitis, HIV, flowcytometry   GERD/epigastric pain, suspect GERD/gastropathy secondary to NSAID use.   Advised patient to use over-the-counter Pepcid or Prilosec for symptoms.  She will need to establish care with GI in the future. Fatigue, check TSH Transaminitis, previous CT shows severe fatty liver disease.  Suspect underlying liver disease. Check hepatitis panel.  Future GI evaluation. Easy bruising, check coags. I had a lengthy discussion with patient that virtual visit evaluation is limited.  I am concerned that she is feeling very tired and fatigue,  easy bruising, complicated past medical history, alcohol abuse.  Recommend patient to go to emergency room for further evaluation. Patient declines.  And prefers to have outpatient lab work-up first. Alcohol cessation was also discussed with patient. Will schedule patient to have lab encounter and see me in office next week. # Patient follow-up with me in approximately 2 weeks to review the above results. Follow Up Instructions: 1 week to go over blood work results I discussed the assessment and treatment plan with the patient. The patient was provided an opportunity to ask questions and all were answered. The patient agreed with the plan and demonstrated an understanding of the instructions.  The patient was advised to call back or seek an in-person evaluation if the symptoms worsen or if the condition fails to improve as anticipated.  Cc Merrie Roof Vilma Meckel, MD 12/08/2018 10:39 AM

## 2018-12-10 LAB — HEPATITIS PANEL, ACUTE
HCV Ab: >11 s/co ratio — ABNORMAL HIGH (ref 0.0–0.9)
Hep A IgM: NEGATIVE
Hep B C IgM: POSITIVE — AB
Hepatitis B Surface Ag: NEGATIVE

## 2018-12-10 LAB — HIV ANTIBODY (ROUTINE TESTING W REFLEX): HIV Screen 4th Generation wRfx: NONREACTIVE

## 2018-12-13 LAB — COMP PANEL: LEUKEMIA/LYMPHOMA

## 2018-12-20 ENCOUNTER — Encounter: Payer: Self-pay | Admitting: Oncology

## 2018-12-20 ENCOUNTER — Other Ambulatory Visit: Payer: Self-pay

## 2018-12-20 ENCOUNTER — Inpatient Hospital Stay (HOSPITAL_BASED_OUTPATIENT_CLINIC_OR_DEPARTMENT_OTHER): Payer: Medicaid Other | Admitting: Oncology

## 2018-12-20 VITALS — BP 122/84 | HR 117 | Temp 99.2°F

## 2018-12-20 DIAGNOSIS — R768 Other specified abnormal immunological findings in serum: Secondary | ICD-10-CM

## 2018-12-20 DIAGNOSIS — E538 Deficiency of other specified B group vitamins: Secondary | ICD-10-CM | POA: Insufficient documentation

## 2018-12-20 DIAGNOSIS — F17210 Nicotine dependence, cigarettes, uncomplicated: Secondary | ICD-10-CM | POA: Diagnosis not present

## 2018-12-20 DIAGNOSIS — R238 Other skin changes: Secondary | ICD-10-CM

## 2018-12-20 DIAGNOSIS — B182 Chronic viral hepatitis C: Secondary | ICD-10-CM | POA: Diagnosis not present

## 2018-12-20 DIAGNOSIS — G600 Hereditary motor and sensory neuropathy: Secondary | ICD-10-CM

## 2018-12-20 DIAGNOSIS — R7401 Elevation of levels of liver transaminase levels: Secondary | ICD-10-CM | POA: Insufficient documentation

## 2018-12-20 DIAGNOSIS — K921 Melena: Secondary | ICD-10-CM

## 2018-12-20 DIAGNOSIS — R233 Spontaneous ecchymoses: Secondary | ICD-10-CM | POA: Insufficient documentation

## 2018-12-20 DIAGNOSIS — R531 Weakness: Secondary | ICD-10-CM | POA: Insufficient documentation

## 2018-12-20 DIAGNOSIS — F1010 Alcohol abuse, uncomplicated: Secondary | ICD-10-CM | POA: Diagnosis not present

## 2018-12-20 DIAGNOSIS — Z720 Tobacco use: Secondary | ICD-10-CM | POA: Insufficient documentation

## 2018-12-20 DIAGNOSIS — R740 Nonspecific elevation of levels of transaminase and lactic acid dehydrogenase [LDH]: Secondary | ICD-10-CM

## 2018-12-20 DIAGNOSIS — D696 Thrombocytopenia, unspecified: Secondary | ICD-10-CM | POA: Diagnosis present

## 2018-12-20 DIAGNOSIS — R5383 Other fatigue: Secondary | ICD-10-CM | POA: Diagnosis not present

## 2018-12-20 DIAGNOSIS — Z789 Other specified health status: Secondary | ICD-10-CM | POA: Insufficient documentation

## 2018-12-20 MED ORDER — FOLIC ACID 1 MG PO TABS: 1.0000 mg | ORAL_TABLET | Freq: Every day | ORAL | 0 refills | Status: DC

## 2018-12-20 NOTE — Progress Notes (Signed)
Patient reports feeling very lethargic and is bloody stools on occasion.  The last bloody stool was 2 days ago.  Explains that she now feels the worse she has ever felt her whole life.

## 2018-12-20 NOTE — Progress Notes (Signed)
Hematology/Oncology Follow Up Note [facility]  [phone] [phone]  Patient Care Team: Venita Lick, NP as PCP - General (Nurse Practitioner)   Name of the patient: Rhonda Martin  [id number]  1975/07/27   REASON FOR VISIT Follow up for thrombocytopenia  PERTINENT HEMATOLOGY HISTORY  # Patient  moved from [place] to [facility].  SHe has a history of Charcot-Marie-Tooth disease She informs me that she was previously told that she low platelet counts before.  reports easy bruising.  Showed me a bruise on her thigh. Today she feels very tired and fatigued, feels like she is almost going to pass out.  Multiple complaints today.  Occasionally she sees bright red blood in the stool.  No fever, chills,.  Chronic pain, she takes Aleve or naproxen with some relief.  Also reports stomach pain, associated with.  Acid reflux symptoms in the morning.  No exacerbating or alleviating factors. She drinks alcohol every day.  Hard liquor. Current every day smoker.  INTERVAL HISTORY 43 y.o. female presents for follow up of thrombocytopenia.  Fatigue: reports worsening fatigue. Chronic onset, perisistent, no aggravating or improving factors, no associated symptoms.  + easy bruising, no aggravating or alleviating factor.  Also seeing blood in stool, occasionally,  Also intermittent left upper quadrant pain,  Per patient's request, fiance Eustaquio Maize was called.  Feels lethargic, has been ongoing for weeks, not aggravating or alleviating factors. Sleeps all the time.  Continues to drink alcohol daily and smokes daily.   Chronic pain  And muscle weakness due to Charcot-Marie-Tooth disease Review of Systems  Constitutional: Positive for fatigue. Negative for appetite change, chills and fever.  HENT:   Negative for hearing loss and voice change.   Eyes: Negative for eye problems.  Respiratory: Negative for chest tightness and cough.    Cardiovascular: Negative for chest pain.  Gastrointestinal: Positive for blood in stool. Negative for abdominal distention and abdominal pain.  Endocrine: Negative for hot flashes.  Genitourinary: Negative for difficulty urinating and frequency.   Musculoskeletal: Negative for arthralgias.  Skin: Negative for itching and rash.  Neurological: Negative for extremity weakness.  Hematological: Negative for adenopathy. Bruises/bleeds easily.  Psychiatric/Behavioral: Negative for confusion.     No Known Allergies   Past Medical History:  Diagnosis Date  . Back injury   . Cervical cancer ([place])   . Charcot-Marie-Tooth disease   . Hypertension   . Leg injury      Past Surgical History:  Procedure Laterality Date  . BACK SURGERY    . LEG SURGERY      Social History   Socioeconomic History  . Marital status: Divorced    Spouse name: Not on file  . Number of children: Not on file  . Years of education: Not on file  . Highest education level: Not on file  Occupational History  . Not on file  Social Needs  . Financial resource strain: Not on file  . Food insecurity    Worry: Not on file    Inability: Not on file  . Transportation needs    Medical: Not on file    Non-medical: Not on file  Tobacco Use  . Smoking status: Current Every Day Smoker    Packs/day: 0.25    Years: 30.00    Pack years: 7.50    Types: Cigarettes  . Smokeless tobacco: Never Used  Substance and Sexual Activity  . Alcohol use: Yes    Alcohol/week: 5.0 standard drinks  Types: 5 Shots of liquor per week    Comment: 1 pint per day drinker. Last drink around 0300  . Drug use: Not Currently  . Sexual activity: Not Currently    Birth control/protection: I.U.D.  Lifestyle  . Physical activity    Days per week: Not on file    Minutes per session: Not on file  . Stress: Not on file  Relationships  . Social Herbalist on phone: Not on file    Gets together: Not on file    Attends  religious service: Not on file    Active member of club or organization: Not on file    Attends meetings of clubs or organizations: Not on file    Relationship status: Not on file  . Intimate partner violence    Fear of current or ex partner: Not on file    Emotionally abused: Not on file    Physically abused: Not on file    Forced sexual activity: Not on file  Other Topics Concern  . Not on file  Social History Narrative  . Not on file    Family History  Problem Relation Age of Onset  . Diabetes Mother   . Hypertension Mother   . Cancer Father        unknown what kind of cancer   . Hypertension Sister   . Hypertension Brother   . Heart attack Brother      Current Outpatient Medications:  .  pantoprazole (PROTONIX) 40 MG tablet, Take 1 tablet (40 mg total) by mouth daily., Disp: 30 tablet, Rfl: 3 .  sucralfate (CARAFATE) 1 g tablet, Take 1 tablet (1 g total) by mouth 4 (four) times daily -  with meals and at bedtime., Disp: 120 tablet, Rfl: 1 .  folic acid (FOLVITE) 1 MG tablet, Take 1 tablet (1 mg total) by mouth daily., Disp: 90 tablet, Rfl: 0 .  naproxen sodium (ALEVE) 220 MG tablet, Take 220 mg by mouth., Disp: , Rfl:   Physical exam:  Vitals:   12/20/18 1340  BP: 122/84  Pulse: (!) 117  Temp: 99.2 F (37.3 C)   Physical Exam Constitutional:      General: She is not in acute distress.    Appearance: She is ill-appearing.  HENT:     Head: Normocephalic and atraumatic.  Eyes:     General: No scleral icterus.    Pupils: Pupils are equal, round, and reactive to light.  Neck:     Musculoskeletal: Normal range of motion and neck supple.  Cardiovascular:     Rate and Rhythm: Normal rate and regular rhythm.     Heart sounds: Normal heart sounds.  Pulmonary:     Effort: Pulmonary effort is normal. No respiratory distress.     Breath sounds: No wheezing.  Abdominal:     General: Bowel sounds are normal. There is no distension.     Palpations: Abdomen is soft.  There is no mass.     Tenderness: There is no abdominal tenderness.  Musculoskeletal: Normal range of motion.        General: No deformity.  Skin:    General: Skin is warm and dry.     Findings: Bruising present. No erythema or rash.  Neurological:     General: No focal deficit present.     Mental Status: She is alert and oriented to person, place, and time.     Cranial Nerves: No cranial nerve deficit.  Coordination: Coordination normal.  Psychiatric:     Comments: Depressed. No suicidal ideation.      CMP Latest Ref Rng & Units 11/30/2018  Glucose 65 - 99 mg/dL 82  BUN 6 - 24 mg/dL 7  Creatinine 0.57 - 1.00 mg/dL 0.46(L)  Sodium 134 - 144 mmol/L 141  Potassium 3.5 - 5.2 mmol/L 3.6  Chloride 96 - 106 mmol/L 99  CO2 20 - 29 mmol/L 19(L)  Calcium 8.7 - 10.2 mg/dL 8.0(L)  Total Protein 6.0 - 8.5 g/dL 7.6  Total Bilirubin 0.0 - 1.2 mg/dL 0.8  Alkaline Phos 39 - 117 IU/L 187(H)  AST 0 - 40 IU/L 283(H)  ALT 0 - 32 IU/L 61(H)   CBC Latest Ref Rng & Units 12/08/2018  WBC 4.0 - 10.5 K/uL 3.3(L)  Hemoglobin 12.0 - 15.0 g/dL 11.5(L)  Hematocrit 36.0 - 46.0 % 33.0(L)  Platelets 150 - 400 K/uL 62(L)    No results found.   Assessment and plan Patient is a 43 y.o. female presents to discuss lab results.  1. Chronic hepatitis C without hepatic coma ([place])   2. Thrombocytopenia ([place])   3. Alcohol abuse   4. Transaminitis   5. Tobacco abuse   6. Easy bruising   7. Other fatigue   8. Folate deficiency   9. Hepatitis B core antibody positive   10. Blood in stool    Labs are reviewed and discussed with patient in the clinic, and also discussed with fiance Doug over the phone.  Thrombocytopenia may be due to chronic liver disease.  Hepatitis C antibody positive, Hepatitis B core antibody IgM positive.  Tansaminitis. Obtain Abdominal US.  Refer to GI for further evaluation. Discussed about avoiding NSAIDs  Easy bruising, Blood in stool due to thrombocytopenia, underlying liver  disease, possible cirrhosis/portal hypertension.  Hemoglobin 11.5. continue monitor.   Smoking cessation and alcohol cessation were discussed with patient.   Folate deficiency, recommend patient to start folic acid 1mg  daily. Rx sent to pharmacy.   Orders Placed This Encounter  Procedures  . US Abdomen Complete    Standing Status:   Future    Standing Expiration Date:   12/20/2019    Order Specific Question:   Reason for Exam (SYMPTOM  OR DIAGNOSIS REQUIRED)    Answer:   Hep C; Alcohol abuse    Order Specific Question:   Preferred imaging location?    Answer:   [facility]  . CBC with Differential/Platelet    Standing Status:   Future    Standing Expiration Date:   12/20/2019  . Ambulatory referral to Gastroenterology    Referral Priority:   Routine    Referral Type:   Consultation    Referral Reason:   Specialty Services Required    Referred to Provider:   Jonathon Bellows, MD    Number of Visits Requested:   1     follow up in 4 weeks.   We spent sufficient time to discuss many aspect of care, questions were answered to patient's satisfaction. Total face to face encounter time for this patient visit was 25 min. >50% of the time was  spent in counseling and coordination of care.    Earlie Server, MD, PhD Hematology Oncology [facility] 12/20/2018

## 2018-12-28 ENCOUNTER — Ambulatory Visit
Admission: RE | Admit: 2018-12-28 | Discharge: 2018-12-28 | Disposition: A | Discharge status: Home / Self Care | Payer: Medicaid Other | Source: Ambulatory Visit | Attending: Oncology | Admitting: Oncology

## 2018-12-28 ENCOUNTER — Other Ambulatory Visit: Payer: Self-pay

## 2018-12-28 DIAGNOSIS — B182 Chronic viral hepatitis C: Secondary | ICD-10-CM | POA: Diagnosis present

## 2019-01-12 ENCOUNTER — Other Ambulatory Visit: Payer: Self-pay

## 2019-01-12 ENCOUNTER — Emergency Department: Payer: Medicaid Other

## 2019-01-12 ENCOUNTER — Emergency Department
Admission: EM | Admit: 2019-01-12 | Discharge: 2019-01-12 | Disposition: A | Discharge status: Home / Self Care | Payer: Medicaid Other | Attending: Emergency Medicine | Admitting: Emergency Medicine

## 2019-01-12 DIAGNOSIS — I10 Essential (primary) hypertension: Secondary | ICD-10-CM | POA: Insufficient documentation

## 2019-01-12 DIAGNOSIS — Z8541 Personal history of malignant neoplasm of cervix uteri: Secondary | ICD-10-CM | POA: Diagnosis not present

## 2019-01-12 DIAGNOSIS — Y999 Unspecified external cause status: Secondary | ICD-10-CM | POA: Insufficient documentation

## 2019-01-12 DIAGNOSIS — F17210 Nicotine dependence, cigarettes, uncomplicated: Secondary | ICD-10-CM | POA: Insufficient documentation

## 2019-01-12 DIAGNOSIS — W010XXA Fall on same level from slipping, tripping and stumbling without subsequent striking against object, initial encounter: Secondary | ICD-10-CM | POA: Diagnosis not present

## 2019-01-12 DIAGNOSIS — S2241XA Multiple fractures of ribs, right side, initial encounter for closed fracture: Secondary | ICD-10-CM | POA: Diagnosis not present

## 2019-01-12 DIAGNOSIS — S4991XA Unspecified injury of right shoulder and upper arm, initial encounter: Secondary | ICD-10-CM | POA: Diagnosis present

## 2019-01-12 DIAGNOSIS — Y92013 Bedroom of single-family (private) house as the place of occurrence of the external cause: Secondary | ICD-10-CM | POA: Insufficient documentation

## 2019-01-12 DIAGNOSIS — Y9301 Activity, walking, marching and hiking: Secondary | ICD-10-CM | POA: Insufficient documentation

## 2019-01-12 MED ORDER — HYDROCODONE-ACETAMINOPHEN 5-325 MG PO TABS: 1.0000 | ORAL_TABLET | Freq: Four times a day (QID) | ORAL | 0 refills | Status: DC | PRN

## 2019-01-12 MED ORDER — HYDROCODONE-ACETAMINOPHEN 5-325 MG PO TABS
1.0000 | ORAL_TABLET | Freq: Once | ORAL | Status: AC
  Administered 2019-01-12: 1 via ORAL
  Filled 2019-01-12: qty 1
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
## 2019-01-12 NOTE — ED Triage Notes (Signed)
Right shoulder injury and pain after slip and fall x 2 nights ago. Pain with movement. Pt tearful, appears to be in pain. Pt alert and oriented X4, active, cooperative, pt in NAD. RR even and unlabored, color WNL.

## 2019-01-12 NOTE — ED Notes (Signed)
Right shoulder injury 2 nights ago when she fell at home.  She thought it was getting better,  But then she rotated it yest and then pain increased.  She is unable to move shoulder joint without severe pain now.  Good circulation distally.  Says only other injuty was bruise on her butt, but better now.  She says she does fall often as she is disabled.  She denies passing out.

## 2019-01-12 NOTE — Discharge Instructions (Signed)
Follow-up with your primary care provider if any continued problems or continued pain medication as needed.  Pain medication was sent to your pharmacy.  You cannot drive or operate machinery while taking this medication.  You can continue using ice to the area as needed for discomfort.  Continue taking deep breaths to avoid getting pneumonia.  Return to the emergency department if any severe worsening of your symptoms or urgent concerns.

## 2019-01-18 ENCOUNTER — Inpatient Hospital Stay: Payer: Medicaid Other

## 2019-01-18 ENCOUNTER — Inpatient Hospital Stay: Payer: Medicaid Other | Admitting: Oncology

## 2019-01-18 ENCOUNTER — Other Ambulatory Visit
Admission: RE | Admit: 2019-01-18 | Discharge: 2019-01-18 | Disposition: A | Discharge status: Home / Self Care | Payer: Medicaid Other | Source: Ambulatory Visit | Attending: Oncology | Admitting: Oncology

## 2019-01-18 DIAGNOSIS — K921 Melena: Secondary | ICD-10-CM | POA: Diagnosis present

## 2019-01-18 DIAGNOSIS — D696 Thrombocytopenia, unspecified: Secondary | ICD-10-CM | POA: Insufficient documentation

## 2019-01-18 DIAGNOSIS — B182 Chronic viral hepatitis C: Secondary | ICD-10-CM

## 2019-01-18 LAB — CBC WITH DIFFERENTIAL/PLATELET
Abs Immature Granulocytes: 0.01 10*3/uL (ref 0.00–0.07)
Basophils Absolute: 0.1 10*3/uL (ref 0.0–0.1)
Basophils Relative: 1 %
Eosinophils Absolute: 0.1 10*3/uL (ref 0.0–0.5)
Eosinophils Relative: 1 %
HCT: 33.1 % — ABNORMAL LOW (ref 36.0–46.0)
Hemoglobin: 10.7 g/dL — ABNORMAL LOW (ref 12.0–15.0)
Immature Granulocytes: 0 %
Lymphocytes Relative: 17 %
Lymphs Abs: 1.1 10*3/uL (ref 0.7–4.0)
MCH: 35.8 pg — ABNORMAL HIGH (ref 26.0–34.0)
MCHC: 32.3 g/dL (ref 30.0–36.0)
MCV: 110.7 fL — ABNORMAL HIGH (ref 80.0–100.0)
Monocytes Absolute: 0.5 10*3/uL (ref 0.1–1.0)
Monocytes Relative: 9 %
Neutro Abs: 4.4 10*3/uL (ref 1.7–7.7)
Neutrophils Relative %: 72 %
Platelets: 95 10*3/uL — ABNORMAL LOW (ref 150–400)
RBC: 2.99 MIL/uL — ABNORMAL LOW (ref 3.87–5.11)
RDW: 12.9 % (ref 11.5–15.5)
WBC: 6.2 10*3/uL (ref 4.0–10.5)
nRBC: 0 % (ref 0.0–0.2)

## 2019-01-18 LAB — IRON AND TIBC
Iron: 28 ug/dL (ref 28–170)
Saturation Ratios: 12 % (ref 10.4–31.8)
TIBC: 234 ug/dL — ABNORMAL LOW (ref 250–450)
UIBC: 206 ug/dL

## 2019-01-18 LAB — FERRITIN: Ferritin: 151 ng/mL (ref 11–307)

## 2019-01-19 ENCOUNTER — Other Ambulatory Visit: Payer: Self-pay

## 2019-01-19 ENCOUNTER — Ambulatory Visit (INDEPENDENT_AMBULATORY_CARE_PROVIDER_SITE_OTHER): Payer: Medicaid Other | Admitting: Gastroenterology

## 2019-01-19 VITALS — BP 112/78 | HR 142 | Temp 99.1°F | Ht 61.5 in | Wt 120.0 lb

## 2019-01-19 DIAGNOSIS — R945 Abnormal results of liver function studies: Secondary | ICD-10-CM

## 2019-01-19 DIAGNOSIS — R768 Other specified abnormal immunological findings in serum: Secondary | ICD-10-CM | POA: Diagnosis not present

## 2019-01-19 DIAGNOSIS — B182 Chronic viral hepatitis C: Secondary | ICD-10-CM

## 2019-01-19 DIAGNOSIS — R7989 Other specified abnormal findings of blood chemistry: Secondary | ICD-10-CM

## 2019-01-19 DIAGNOSIS — E46 Unspecified protein-calorie malnutrition: Secondary | ICD-10-CM

## 2019-01-19 DIAGNOSIS — K625 Hemorrhage of anus and rectum: Secondary | ICD-10-CM | POA: Diagnosis not present

## 2019-01-19 DIAGNOSIS — D696 Thrombocytopenia, unspecified: Secondary | ICD-10-CM

## 2019-01-19 DIAGNOSIS — F1029 Alcohol dependence with unspecified alcohol-induced disorder: Secondary | ICD-10-CM

## 2019-01-19 NOTE — Patient Instructions (Signed)
Liver Elastography Scheduled for Tuesday 03/26/19 Rhonda Martin arrive at 10:15am for 10:30 appt.  Do Not Eat or Drink anything after Midnight prior to imaging.  Please contact AA at [phone] or "help@[employer] .org" for help to quit alcohol.  Follow up with Dr. Vicente Males in 4 weeks.  Thank you for allowing [place] to help serve your healthcare needs.  [facility]

## 2019-01-19 NOTE — Progress Notes (Signed)
Jonathon Bellows MD, MRCP(U.K) [address]  [place]  [place], [place] [address]  Main: [phone]  Fax: [phone]   Gastroenterology Consultation  Referring Provider:     Earlie Server, MD Primary Care Physician:  Volney American, PA-C Primary Gastroenterologist:  Dr. Jonathon Bellows  Reason for Consultation:     Chronic hepatitis C        HPI:   Rhonda Martin is a 43 y.o. y/o female referred for consultation & management  By Dr. Tasia Catchings.  She has been seen by Dr. Tasia Catchings in the past for thrombocytopenia.  She has a history of Charcot-Marie-Tooth disease. History of alcohol consumption on a daily basis.  Review of recent labs demonstrates an elevated alkaline phosphatase, AST, ALT.platelets low at 62.  Ultrasound abdomen on 12/28/2018 demonstrates fatty infiltration of the liver.  Iron studies normal, B12 levels normal.  Folate levels are low.  INR 1.  Hepatitis B core IgM positive hepatitis C virus antibody positive  She says that she drinks 1 L of vodka every day.  She wakes up in the morning and have a drink.  She denies any recent illegal drug use but have done so in the past.  Denies any tattoos.  Denies any Armed forces logistics/support/administrative officer.  Denies any family history of liver disease.  Denies any herbal supplements.  No new medications.  Never been treated for hepatitis B or C.  Losing weight.  Does not eat much.  Recent rib fracture.  She has some rectal bleeding on and off when she has a bowel movement.  Last colonoscopy was in 2012.  And she says that she had no polyps at that point of time.  Past Medical History:  Diagnosis Date  . Back injury   . Cervical cancer ([place])   . Charcot-Marie-Tooth disease   . Hypertension   . Leg injury     Past Surgical History:  Procedure Laterality Date  . BACK SURGERY    . LEG SURGERY      Prior to Admission medications   Medication Sig Start Date End Date Taking? Authorizing Provider  folic acid (FOLVITE) 1 MG tablet Take 1 tablet (1 mg total) by mouth  daily. 12/20/18  Yes Earlie Server, MD  HYDROcodone-acetaminophen (NORCO/VICODIN) 5-325 MG tablet Take 1 tablet by mouth every 6 (six) hours as needed for moderate pain. 01/12/19  Yes Letitia Neri L, PA-C  pantoprazole (PROTONIX) 40 MG tablet Take 1 tablet (40 mg total) by mouth daily. 11/30/18  Yes Volney American, PA-C  sucralfate (CARAFATE) 1 g tablet Take 1 tablet (1 g total) by mouth 4 (four) times daily -  with meals and at bedtime. 11/30/18  Yes Volney American, PA-C  naproxen sodium (ALEVE) 220 MG tablet Take 220 mg by mouth.    [provider]    Family History  Problem Relation Age of Onset  . Diabetes Mother   . Hypertension Mother   . Cancer Father        unknown what kind of cancer   . Hypertension Sister   . Hypertension Brother   . Heart attack Brother      Social History   Tobacco Use  . Smoking status: Current Every Day Smoker    Packs/day: 0.25    Years: 30.00    Pack years: 7.50    Types: Cigarettes  . Smokeless tobacco: Never Used  Substance Use Topics  . Alcohol use: Yes    Alcohol/week: 5.0 standard drinks  Types: 5 Shots of liquor per week    Comment: 1 pint per day drinker. Last drink around 0300  . Drug use: Not Currently    Allergies as of 01/19/2019  . (No Known Allergies)    Review of Systems:    All systems reviewed and negative except where noted in HPI.   Physical Exam:  BP 112/78   Pulse (!) 142   Temp 99.1 F (37.3 C)   Ht 5' 1.5" (1.562 m)   Wt 120 lb (54.4 kg)   BMI 22.31 kg/m  No LMP recorded. (Menstrual status: IUD). Psych:  Alert and cooperative. Normal mood and affect.  Sitting in a wheelchair. General:   Alert, very thin and cachectic.  Pleasant and cooperative in NAD Head:  Normocephalic and atraumatic. Eyes:  Sclera clear, no icterus.   Conjunctiva pink. Ears:  Normal auditory acuity. Nose:  No deformity, discharge, or lesions. Mouth:  No deformity or lesions,oropharynx pink & moist. Neck:  Supple;  no masses or thyromegaly. Lungs: Multiple spider angiomas over the chest.  Respirations even and unlabored.  Clear throughout to auscultation.   No wheezes, crackles, or rhonchi. No acute distress. Heart:  Regular rate and rhythm; no murmurs, clicks, rubs, or gallops. Abdomen:  Normal bowel sounds.  No bruits.  Soft, non-tender and non-distended without masses, hepatosplenomegaly or hernias noted.  No guarding or rebound tenderness.    Neurologic:  Alert and oriented x3;  grossly normal neurologically. Skin: Multiple bruises over the skin. Lymph Nodes:  No significant cervical adenopathy. Psych:  Alert and cooperative. Normal mood and affect.  Imaging Studies: Dg Shoulder Right  Result Date: 01/12/2019 CLINICAL DATA:  RIGHT shoulder pain and injury after slipping in shower and falling 2 nights ago, pain with movement EXAM: RIGHT SHOULDER - 2+ VIEW COMPARISON:  None FINDINGS: Osseous demineralization. AC joint alignment normal. No glenohumeral fracture or dislocation is identified. Clavicle and scapula appear intact. Minimally displaced fractures of the lateral RIGHT third and fourth ribs. Age-indeterminate fractures of the lateral RIGHT fifth and questionably sixth ribs. IMPRESSION: Acute fractures of the lateral RIGHT third and fourth ribs. Age-indeterminate fractures of the lateral RIGHT fifth and sixth ribs. Electronically Signed   By: Lavonia Dana M.D.   On: 01/12/2019 10:33   US Abdomen Complete  Result Date: 12/28/2018 CLINICAL DATA:  Chronic hepatitis-C EXAM: ABDOMEN ULTRASOUND COMPLETE COMPARISON:  None. FINDINGS: Gallbladder: 5 mm mobile stone within the gallbladder. No wall thickening. Common bile duct: Diameter: Normal caliber, 4 mm Liver: Enlarged. Diffusely increased echotexture. No focal hepatic abnormality. Portal vein is patent on color Doppler imaging with normal direction of blood flow towards the liver. IVC: No abnormality visualized. Pancreas: Visualized portion unremarkable.  Spleen: Size and appearance within normal limits. Right Kidney: Length: 11.0 cm. Echogenicity within normal limits. No mass or hydronephrosis visualized. Left Kidney: Length: 11.2 cm. Echogenicity within normal limits. No mass or hydronephrosis visualized. Abdominal aorta: No aneurysm visualized. Other findings: None. IMPRESSION: Hepatomegaly. Diffusely increased echotexture compatible with fatty infiltration or intrinsic liver disease. No focal hepatic abnormality. Electronically Signed   By: Rolm Baptise M.D.   On: 12/28/2018 14:33    Assessment and Plan:   ALYSIANA ETHRIDGE is a 43 y.o. y/o female has been referred for chronic hepatitis C.  Recently being evaluated by Dr. Tasia Catchings for thrombocytopenia.  She has a very longstanding history of excess alcohol use including consuming alcohol first thing in the morning as an eye opener drink.  She has hepatitis C  antibody that is positive and hepatitis B core IgM that is also positive.  She has a lot of muscle wasting and mild nourishment probably due to excess alcohol use and poor nutritional intake.  She has features of chronic liver disease on examination.  It is possible that the thrombocytopenia might be secondary to to alcohol excess versus liver disease versus folic acid deficiency or a combination.  I was very straight with her that she needs to stop alcohol consumption today and seek help from alcoholic anonymous or inpatient rehab.  I also suggested her that she needs to drink some boost every day.  I informed her that we will check her labs for hepatitis B C, immune status to hepatitis A.  Autoimmune liver work-up.  The abnormal liver function tests are likely due to combination of excess alcohol consumption along with effects of hepatitis C and possibly even B.  I also suggested she get a colonoscopy for rectal bleeding but she is not ready at this point of time.  Advised to continue folic acid supplementation.  Follow up in 4 weeks  Dr Jonathon Bellows  MD,MRCP(U.K)

## 2019-01-20 ENCOUNTER — Other Ambulatory Visit: Payer: Self-pay

## 2019-01-20 ENCOUNTER — Encounter: Payer: Self-pay | Admitting: Oncology

## 2019-01-20 ENCOUNTER — Inpatient Hospital Stay (HOSPITAL_BASED_OUTPATIENT_CLINIC_OR_DEPARTMENT_OTHER): Payer: Medicaid Other | Admitting: Oncology

## 2019-01-20 ENCOUNTER — Inpatient Hospital Stay
Admission: EM | Admit: 2019-01-20 | Discharge: 2019-01-27 | DRG: 897 | Disposition: A | Discharge status: Home Health | Payer: Medicaid Other | Attending: Internal Medicine | Admitting: Internal Medicine

## 2019-01-20 ENCOUNTER — Inpatient Hospital Stay: Payer: Medicaid Other | Attending: Oncology

## 2019-01-20 VITALS — BP 112/81 | HR 153 | Temp 99.1°F | Resp 20 | Wt 120.0 lb

## 2019-01-20 DIAGNOSIS — R7401 Elevation of levels of liver transaminase levels: Secondary | ICD-10-CM

## 2019-01-20 DIAGNOSIS — F411 Generalized anxiety disorder: Secondary | ICD-10-CM | POA: Diagnosis present

## 2019-01-20 DIAGNOSIS — Z79899 Other long term (current) drug therapy: Secondary | ICD-10-CM

## 2019-01-20 DIAGNOSIS — D696 Thrombocytopenia, unspecified: Secondary | ICD-10-CM

## 2019-01-20 DIAGNOSIS — F10931 Alcohol use, unspecified with withdrawal delirium: Secondary | ICD-10-CM | POA: Diagnosis present

## 2019-01-20 DIAGNOSIS — Z8541 Personal history of malignant neoplasm of cervix uteri: Secondary | ICD-10-CM

## 2019-01-20 DIAGNOSIS — B182 Chronic viral hepatitis C: Secondary | ICD-10-CM

## 2019-01-20 DIAGNOSIS — W19XXXA Unspecified fall, initial encounter: Secondary | ICD-10-CM | POA: Diagnosis present

## 2019-01-20 DIAGNOSIS — Z20828 Contact with and (suspected) exposure to other viral communicable diseases: Secondary | ICD-10-CM | POA: Diagnosis present

## 2019-01-20 DIAGNOSIS — F418 Other specified anxiety disorders: Secondary | ICD-10-CM | POA: Diagnosis present

## 2019-01-20 DIAGNOSIS — F17210 Nicotine dependence, cigarettes, uncomplicated: Secondary | ICD-10-CM | POA: Diagnosis present

## 2019-01-20 DIAGNOSIS — G600 Hereditary motor and sensory neuropathy: Secondary | ICD-10-CM | POA: Diagnosis present

## 2019-01-20 DIAGNOSIS — I959 Hypotension, unspecified: Secondary | ICD-10-CM | POA: Diagnosis not present

## 2019-01-20 DIAGNOSIS — F10232 Alcohol dependence with withdrawal with perceptual disturbance: Principal | ICD-10-CM | POA: Diagnosis present

## 2019-01-20 DIAGNOSIS — E538 Deficiency of other specified B group vitamins: Secondary | ICD-10-CM | POA: Diagnosis present

## 2019-01-20 DIAGNOSIS — F1010 Alcohol abuse, uncomplicated: Secondary | ICD-10-CM

## 2019-01-20 DIAGNOSIS — Z720 Tobacco use: Secondary | ICD-10-CM

## 2019-01-20 DIAGNOSIS — F1024 Alcohol dependence with alcohol-induced mood disorder: Secondary | ICD-10-CM | POA: Diagnosis present

## 2019-01-20 DIAGNOSIS — R238 Other skin changes: Secondary | ICD-10-CM

## 2019-01-20 DIAGNOSIS — I10 Essential (primary) hypertension: Secondary | ICD-10-CM | POA: Diagnosis present

## 2019-01-20 DIAGNOSIS — R740 Nonspecific elevation of levels of transaminase and lactic acid dehydrogenase [LDH]: Secondary | ICD-10-CM

## 2019-01-20 DIAGNOSIS — K625 Hemorrhage of anus and rectum: Secondary | ICD-10-CM | POA: Diagnosis present

## 2019-01-20 DIAGNOSIS — D6959 Other secondary thrombocytopenia: Secondary | ICD-10-CM | POA: Diagnosis present

## 2019-01-20 DIAGNOSIS — F10239 Alcohol dependence with withdrawal, unspecified: Secondary | ICD-10-CM | POA: Diagnosis present

## 2019-01-20 DIAGNOSIS — Z765 Malingerer [conscious simulation]: Secondary | ICD-10-CM

## 2019-01-20 DIAGNOSIS — S2241XA Multiple fractures of ribs, right side, initial encounter for closed fracture: Secondary | ICD-10-CM | POA: Diagnosis present

## 2019-01-20 DIAGNOSIS — R233 Spontaneous ecchymoses: Secondary | ICD-10-CM

## 2019-01-20 DIAGNOSIS — K2920 Alcoholic gastritis without bleeding: Secondary | ICD-10-CM | POA: Diagnosis present

## 2019-01-20 DIAGNOSIS — Z833 Family history of diabetes mellitus: Secondary | ICD-10-CM | POA: Diagnosis not present

## 2019-01-20 DIAGNOSIS — F10932 Alcohol use, unspecified with withdrawal with perceptual disturbance: Secondary | ICD-10-CM

## 2019-01-20 DIAGNOSIS — G4700 Insomnia, unspecified: Secondary | ICD-10-CM | POA: Diagnosis present

## 2019-01-20 DIAGNOSIS — R000 Tachycardia, unspecified: Secondary | ICD-10-CM | POA: Diagnosis present

## 2019-01-20 DIAGNOSIS — E876 Hypokalemia: Secondary | ICD-10-CM | POA: Diagnosis present

## 2019-01-20 DIAGNOSIS — R17 Unspecified jaundice: Secondary | ICD-10-CM

## 2019-01-20 HISTORY — DX: Alcohol use, unspecified with withdrawal delirium: F10.931

## 2019-01-20 LAB — CBC WITH DIFFERENTIAL/PLATELET
Abs Immature Granulocytes: 0.01 10*3/uL (ref 0.00–0.07)
Basophils Absolute: 0.1 10*3/uL (ref 0.0–0.1)
Basophils Relative: 1 %
Eosinophils Absolute: 0 10*3/uL (ref 0.0–0.5)
Eosinophils Relative: 0 %
HCT: 30.1 % — ABNORMAL LOW (ref 36.0–46.0)
Hemoglobin: 9.9 g/dL — ABNORMAL LOW (ref 12.0–15.0)
Immature Granulocytes: 0 %
Lymphocytes Relative: 11 %
Lymphs Abs: 0.5 10*3/uL — ABNORMAL LOW (ref 0.7–4.0)
MCH: 36.1 pg — ABNORMAL HIGH (ref 26.0–34.0)
MCHC: 32.9 g/dL (ref 30.0–36.0)
MCV: 109.9 fL — ABNORMAL HIGH (ref 80.0–100.0)
Monocytes Absolute: 0.4 10*3/uL (ref 0.1–1.0)
Monocytes Relative: 8 %
Neutro Abs: 3.5 10*3/uL (ref 1.7–7.7)
Neutrophils Relative %: 80 %
Platelets: 57 10*3/uL — ABNORMAL LOW (ref 150–400)
RBC: 2.74 MIL/uL — ABNORMAL LOW (ref 3.87–5.11)
RDW: 12.9 % (ref 11.5–15.5)
WBC: 4.4 10*3/uL (ref 4.0–10.5)
nRBC: 0 % (ref 0.0–0.2)

## 2019-01-20 LAB — SARS CORONAVIRUS 2 (TAT 6-24 HRS): SARS Coronavirus 2: NEGATIVE

## 2019-01-20 LAB — CBC
HCT: 30.4 % — ABNORMAL LOW (ref 36.0–46.0)
Hemoglobin: 9.8 g/dL — ABNORMAL LOW (ref 12.0–15.0)
MCH: 35.9 pg — ABNORMAL HIGH (ref 26.0–34.0)
MCHC: 32.2 g/dL (ref 30.0–36.0)
MCV: 111.4 fL — ABNORMAL HIGH (ref 80.0–100.0)
Platelets: 67 10*3/uL — ABNORMAL LOW (ref 150–400)
RBC: 2.73 MIL/uL — ABNORMAL LOW (ref 3.87–5.11)
RDW: 12.8 % (ref 11.5–15.5)
WBC: 5.4 10*3/uL (ref 4.0–10.5)
nRBC: 0 % (ref 0.0–0.2)

## 2019-01-20 LAB — COMPREHENSIVE METABOLIC PANEL
ALT: 44 U/L (ref 0–44)
AST: 291 U/L — ABNORMAL HIGH (ref 15–41)
Albumin: 2.4 g/dL — ABNORMAL LOW (ref 3.5–5.0)
Alkaline Phosphatase: 220 U/L — ABNORMAL HIGH (ref 38–126)
Anion gap: 15 (ref 5–15)
BUN: <5 mg/dL — ABNORMAL LOW (ref 6–20)
CO2: 19 mmol/L — ABNORMAL LOW (ref 22–32)
Calcium: 7.7 mg/dL — ABNORMAL LOW (ref 8.9–10.3)
Chloride: 102 mmol/L (ref 98–111)
Creatinine, Ser: 0.33 mg/dL — ABNORMAL LOW (ref 0.44–1.00)
GFR calc Af Amer: >60 mL/min (ref >60)
GFR calc non Af Amer: >60 mL/min (ref >60)
Glucose, Bld: 198 mg/dL — ABNORMAL HIGH (ref 70–99)
Potassium: 3.3 mmol/L — ABNORMAL LOW (ref 3.5–5.1)
Sodium: 136 mmol/L (ref 135–145)
Total Bilirubin: 5.9 mg/dL — ABNORMAL HIGH (ref 0.3–1.2)
Total Protein: 7.1 g/dL (ref 6.5–8.1)

## 2019-01-20 LAB — PROTIME-INR
INR: 1.3 — ABNORMAL HIGH (ref 0.8–1.2)
Prothrombin Time: 16.2 seconds — ABNORMAL HIGH (ref 11.4–15.2)

## 2019-01-20 LAB — LIPASE, BLOOD: Lipase: 42 U/L (ref 11–51)

## 2019-01-20 MED ORDER — LORAZEPAM 2 MG/ML IJ SOLN
0.0000 mg | Freq: Two times a day (BID) | INTRAMUSCULAR | Status: AC
  Administered 2019-01-22 – 2019-01-23 (×3): 1 mg via INTRAVENOUS
  Filled 2019-01-20 (×3): qty 1

## 2019-01-20 MED ORDER — MAGNESIUM SULFATE IN D5W 1-5 GM/100ML-% IV SOLN
1.0000 g | Freq: Once | INTRAVENOUS | Status: AC
  Administered 2019-01-20: 1 g via INTRAVENOUS
  Filled 2019-01-20: qty 100

## 2019-01-20 MED ORDER — PANTOPRAZOLE SODIUM 40 MG PO TBEC
40.0000 mg | DELAYED_RELEASE_TABLET | Freq: Every day | ORAL | Status: DC
  Administered 2019-01-20 – 2019-01-27 (×8): 40 mg via ORAL
  Filled 2019-01-20 (×8): qty 1

## 2019-01-20 MED ORDER — POTASSIUM CHLORIDE 10 MEQ/100ML IV SOLN
10.0000 meq | Freq: Once | INTRAVENOUS | Status: AC
  Administered 2019-01-20: 10 meq via INTRAVENOUS
  Filled 2019-01-20: qty 100

## 2019-01-20 MED ORDER — THIAMINE HCL 100 MG/ML IJ SOLN
100.0000 mg | Freq: Every day | INTRAMUSCULAR | Status: DC
  Administered 2019-01-20: 100 mg via INTRAVENOUS
  Filled 2019-01-20: qty 2

## 2019-01-20 MED ORDER — LORAZEPAM 2 MG/ML IJ SOLN
1.0000 mg | Freq: Once | INTRAMUSCULAR | Status: AC
  Administered 2019-01-20: 1 mg via INTRAVENOUS
  Filled 2019-01-20: qty 1

## 2019-01-20 MED ORDER — VITAMIN B-1 100 MG PO TABS: 100.0000 mg | ORAL_TABLET | Freq: Every day | ORAL | Status: DC

## 2019-01-20 MED ORDER — SODIUM CHLORIDE 0.9% FLUSH: 3.0000 mL | INTRAVENOUS | Status: DC | PRN

## 2019-01-20 MED ORDER — POTASSIUM CHLORIDE CRYS ER 20 MEQ PO TBCR
20.0000 meq | EXTENDED_RELEASE_TABLET | Freq: Two times a day (BID) | ORAL | Status: DC
  Administered 2019-01-20 – 2019-01-27 (×15): 20 meq via ORAL
  Filled 2019-01-20 (×15): qty 1

## 2019-01-20 MED ORDER — KETOROLAC TROMETHAMINE 15 MG/ML IJ SOLN
15.0000 mg | Freq: Once | INTRAMUSCULAR | Status: AC
  Administered 2019-01-20: 15 mg via INTRAVENOUS
  Filled 2019-01-20: qty 1

## 2019-01-20 MED ORDER — LORAZEPAM 2 MG/ML IJ SOLN
0.0000 mg | Freq: Four times a day (QID) | INTRAMUSCULAR | Status: AC
  Administered 2019-01-20 (×2): 2 mg via INTRAVENOUS
  Administered 2019-01-21 – 2019-01-22 (×6): 1 mg via INTRAVENOUS
  Filled 2019-01-20 (×8): qty 1

## 2019-01-20 MED ORDER — FOLIC ACID 1 MG PO TABS: 1.0000 mg | ORAL_TABLET | Freq: Every day | ORAL | Status: DC

## 2019-01-20 MED ORDER — ADULT MULTIVITAMIN W/MINERALS CH: 1.0000 | ORAL_TABLET | Freq: Every day | ORAL | Status: DC

## 2019-01-20 MED ORDER — SUCRALFATE 1 G PO TABS
1.0000 g | ORAL_TABLET | Freq: Three times a day (TID) | ORAL | Status: DC
  Administered 2019-01-20 – 2019-01-27 (×28): 1 g via ORAL
  Filled 2019-01-20 (×29): qty 1

## 2019-01-20 MED ORDER — THIAMINE HCL 100 MG/ML IJ SOLN
Freq: Once | INTRAVENOUS | Status: AC
  Administered 2019-01-20: 11:00:00 via INTRAVENOUS
  Filled 2019-01-20: qty 1000

## 2019-01-20 MED ORDER — METOPROLOL TARTRATE 5 MG/5ML IV SOLN
5.0000 mg | Freq: Once | INTRAVENOUS | Status: AC
  Administered 2019-01-20: 5 mg via INTRAVENOUS
  Filled 2019-01-20 (×2): qty 5

## 2019-01-20 MED ORDER — ADULT MULTIVITAMIN W/MINERALS CH
1.0000 | ORAL_TABLET | Freq: Every day | ORAL | Status: DC
  Administered 2019-01-20 – 2019-01-27 (×8): 1 via ORAL
  Filled 2019-01-20 (×9): qty 1

## 2019-01-20 MED ORDER — FOLIC ACID 1 MG PO TABS
1.0000 mg | ORAL_TABLET | Freq: Every day | ORAL | Status: DC
  Administered 2019-01-20 – 2019-01-21 (×2): 1 mg via ORAL
  Filled 2019-01-20 (×4): qty 1

## 2019-01-20 MED ORDER — FOLIC ACID 1 MG PO TABS
1.0000 mg | ORAL_TABLET | Freq: Every day | ORAL | Status: DC
  Administered 2019-01-22 – 2019-01-27 (×6): 1 mg via ORAL
  Filled 2019-01-20 (×6): qty 1

## 2019-01-20 MED ORDER — SODIUM CHLORIDE 0.9 % IV BOLUS
1000.0000 mL | Freq: Once | INTRAVENOUS | Status: AC
  Administered 2019-01-20: 1000 mL via INTRAVENOUS

## 2019-01-20 MED ORDER — SODIUM CHLORIDE 0.9% FLUSH
3.0000 mL | Freq: Two times a day (BID) | INTRAVENOUS | Status: DC
  Administered 2019-01-20 – 2019-01-27 (×14): 3 mL via INTRAVENOUS

## 2019-01-20 MED ORDER — VITAMIN B-1 100 MG PO TABS
100.0000 mg | ORAL_TABLET | Freq: Every day | ORAL | Status: DC
  Administered 2019-01-21 – 2019-01-27 (×7): 100 mg via ORAL
  Filled 2019-01-20 (×8): qty 1

## 2019-01-20 MED ORDER — LORAZEPAM 2 MG/ML IJ SOLN: 1.0000 mg | Freq: Four times a day (QID) | INTRAMUSCULAR | Status: DC | PRN

## 2019-01-20 MED ORDER — ACETAMINOPHEN 325 MG PO TABS
650.0000 mg | ORAL_TABLET | Freq: Four times a day (QID) | ORAL | Status: DC | PRN
  Administered 2019-01-21: 650 mg via ORAL
  Filled 2019-01-20: qty 2

## 2019-01-20 MED ORDER — LORAZEPAM 1 MG PO TABS: 1.0000 mg | ORAL_TABLET | Freq: Four times a day (QID) | ORAL | Status: DC | PRN

## 2019-01-20 NOTE — H&P (Signed)
[facility] NAME: Rhonda Martin    MR#:  [id number]  DATE OF BIRTH:  [birth year]  DATE OF ADMISSION:  01/20/2019  PRIMARY CARE PHYSICIAN: Volney American, PA-C   REQUESTING/REFERRING PHYSICIAN:  CHIEF COMPLAINT:   Chief Complaint  Patient presents with  . Withdrawal    HISTORY OF PRESENT ILLNESS:   43 year old female with past medical history of chronic hepatitis C, CMT, hypertension, transaminitis, folate deficiency, EtOH abuse, tobacco abuse, rectal bleed and malnutrition presenting to the ED with EtOH withdrawal.  Patient reports onset of symptoms since this morning which appears to be worsening.  Patient states since stopping drinking last night, she feels extremely shaky, tremulous, feels like her heart is racing with nausea.  Patient also endorses episode of visual hallucination this morning.  Patient was seen by her oncologist today for thrombocytopenia and anemia, at the appointment she was noted to be shaking and unsteady on her feet. She complained of abdominal pain and "just not feeling right".  She was advised by her oncologist to go to the emergency room for further evaluation of tachycardia, and questionable alcohol withdrawal.  On arrival to the ED, she was afebrile with blood pressure 130/61 mm Hg and pulse rate 150 beats/min. There were no focal neurological deficits; she was alert and oriented x4, but appeared uncomfortable.  Initial labs revealed hemoglobin 9.9, platelets 57 potassium 3.3, glucose 198, AST 291, alk phos 220, total bilirubin 5.9, lipase normal.  Patient received IV Ativan in the ED with improvement in tremors noted.  She will be admitted under hospitalist service for further management and monitoring of alcohol withdrawal symptoms.  PAST MEDICAL HISTORY:   Past Medical History:  Diagnosis Date  . Back injury   . Cervical cancer ([place])   . Charcot-Marie-Tooth disease   . Hypertension   . Leg  injury     PAST SURGICAL HISTORY:   Past Surgical History:  Procedure Laterality Date  . BACK SURGERY    . LEG SURGERY      SOCIAL HISTORY:   Social History   Tobacco Use  . Smoking status: Current Every Day Smoker    Packs/day: 0.25    Years: 30.00    Pack years: 7.50    Types: Cigarettes  . Smokeless tobacco: Never Used  Substance Use Topics  . Alcohol use: Yes    Alcohol/week: 5.0 standard drinks    Types: 5 Shots of liquor per week    Comment: 1 pint per day drinker. Last drink around 0300    FAMILY HISTORY:   Family History  Problem Relation Age of Onset  . Diabetes Mother   . Hypertension Mother   . Cancer Father        unknown what kind of cancer   . Hypertension Sister   . Hypertension Brother   . Heart attack Brother     DRUG ALLERGIES:  No Known Allergies  REVIEW OF SYSTEMS:   Review of Systems  Constitutional: Positive for weight loss. Negative for chills, fever and malaise/fatigue.  HENT: Negative for congestion, hearing loss and sore throat.   Eyes: Negative for blurred vision and double vision.  Respiratory: Negative for cough, shortness of breath and wheezing.   Cardiovascular: Positive for palpitations. Negative for chest pain, orthopnea and leg swelling.  Gastrointestinal: Positive for abdominal pain, blood in stool and nausea. Negative for diarrhea and vomiting.  Genitourinary: Negative for dysuria and urgency.  Musculoskeletal: Positive for falls.  Negative for myalgias.  Skin: Negative for rash.  Neurological: Positive for tremors and weakness. Negative for dizziness, sensory change, speech change, focal weakness and headaches.  Endo/Heme/Allergies: Bruises/bleeds easily.  Psychiatric/Behavioral: Positive for hallucinations and substance abuse. Negative for depression. The patient is nervous/anxious and has insomnia.    MEDICATIONS AT HOME:   Prior to Admission medications   Medication Sig Start Date End Date Taking? Authorizing  Provider  folic acid (FOLVITE) 1 MG tablet Take 1 tablet (1 mg total) by mouth daily. 12/20/18   Earlie Server, MD  HYDROcodone-acetaminophen (NORCO/VICODIN) 5-325 MG tablet Take 1 tablet by mouth every 6 (six) hours as needed for moderate pain. Patient not taking: Reported on 01/20/2019 01/12/19   Letitia Neri L, PA-C  naproxen sodium (ALEVE) 220 MG tablet Take 220 mg by mouth.    [provider]  pantoprazole (PROTONIX) 40 MG tablet Take 1 tablet (40 mg total) by mouth daily. 11/30/18   Volney American, PA-C  sucralfate (CARAFATE) 1 g tablet Take 1 tablet (1 g total) by mouth 4 (four) times daily -  with meals and at bedtime. 11/30/18   Volney American, PA-C      VITAL SIGNS:  Blood pressure 118/79, pulse (!) 127, temperature 99.5 F (37.5 C), temperature source Oral, resp. rate 18, height _0  (1.575 m), weight 54.4 kg, SpO2 96 %.  PHYSICAL EXAMINATION:   Physical Exam  GENERAL:  43 y.o.-year-old patient lying in the bed restless, pale and cachetic looking EYES: Pupils equal, round, reactive to light and accommodation. No scleral icterus. Extraocular muscles intact.  HEENT: Head atraumatic, normocephalic. Oropharynx and nasopharynx clear.  NECK:  Supple, no jugular venous distention. No thyroid enlargement, no tenderness.  LUNGS: Normal breath sounds bilaterally, no wheezing, rales,rhonchi or crepitation. No use of accessory muscles of respiration.  CARDIOVASCULAR: S1, S2 normal. No murmurs, rubs, or gallops.  ABDOMEN: Soft, nontender, nondistended. Bowel sounds present. No organomegaly or mass.  EXTREMITIES: No pedal edema, cyanosis, or clubbing. No rash or lesions. + pedal pulses. Bilateral upper and lower extremities tremors. MUSCULOSKELETAL: Normal bulk, and power was 5+ grip and elbow, knee, and ankle flexion and extension bilaterally.  NEUROLOGIC:Alert and oriented x 3. CN 2-12 intact. Sensation to light touch and cold stimuli intact bilaterally. Unable to perform  Finger to nose due to tremors. DTR's (biceps, patellar, and achilles) 2+ and symmetric throughout. Gait not tested due to safety concern. PSYCHIATRIC: The patient is alert and oriented x 3.  SKIN: No obvious rash, lesion, or ulcer.   DATA REVIEWED:  LABORATORY PANEL:   CBC Recent Labs  Lab 01/20/19 1045  WBC 5.4  HGB 9.8*  HCT 30.4*  PLT 67*   ------------------------------------------------------------------------------------------------------------------  Chemistries  Recent Labs  Lab 01/20/19 1045  NA 136  K 3.3*  CL 102  CO2 19*  GLUCOSE 198*  BUN <5*  CREATININE 0.33*  CALCIUM 7.7*  AST 291*  ALT 44  ALKPHOS 220*  BILITOT 5.9*   ------------------------------------------------------------------------------------------------------------------  Cardiac Enzymes No results for input(s): TROPONINI in the last 168 hours. ------------------------------------------------------------------------------------------------------------------  RADIOLOGY:  No results found.  EKG:  EKG: unchanged from previous tracings, sinus tachycardia. Vent. rate 152 BPM PR interval * ms QRS duration 70 ms QT/QTc 330/524 ms P-R-T axes * 44 43  IMPRESSION AND PLAN:   43 y.o. female with past medical history of chronic hepatitis C, CMT, hypertension, transaminitis, folate deficiency, EtOH abuse, tobacco abuse, rectal bleed and malnutrition presenting to the ED with EtOH withdrawal.  1.  EtOH Withdrawal - Hx of Acohol abuse with  Evidence of End-Organ Damage  Average Drinks Per Day 1 L Vodka per day Last Drink Last night No Hx Seizures   Admit to medsurg unit with telemetry monitoring - Monitor CMP, INR, Daily BMP+Mg - Banana Bag x1 (1L NS or D5W, 169m thiamine, 155mfolate, 1amp/tab MVI, 3g magnesium sulfate) x 1 dose then switch to PO - Daily Thiamine, Folate, MVI once tolerating PO - Insomnia: Start Melatonin if no improvement may consider Doxepin 10 mg - SW consult for  cessation resources - PT/OT evaluation for mobility - CIWA +/- Standing Protocol   2. Chronic thrombocytopenia -likely due to alcohol usage - Follows with Dr. YuTasia Catchings No signs of bleeding we will continue to monitor  3. Hypokalemia -replete and recheck in a.m.+mag  4. Alcoholic gastritis -continue Protonix and Carafate  5. HTN -stable + Goal BP <130/80 -PRN hydralazine  6. Polysubstance abuse -Counseling provided -Social work referral  7. Intermittent rectal bleed - Following with GI Dr. KiBailey Mech Plan for colonoscopy if patient agreeable  8. Elevated Bilirubin  - Work up per GI   9. DVT prophylaxis - Hold anti-coagulation for thrombocytopenia (PLT <100k)   All the records are reviewed and case discussed with ED provider. Management plans discussed with the patient, family and they are in agreement.  CODE STATUS: FULL  TOTAL TIME TAKING CARE OF THIS PATIENT: 50 minutes.    on 01/20/2019 at 1:22 PM   ElRufina FalcoDNP, FNP-BC [facility] Nurse Practitioner Between 7am to 6pm - Pager - [phone] 6pm go to [email or web address] - password [email or web address]  Office  [phone]: Primary care physician; LaVolney AmericanPA-C

## 2019-01-20 NOTE — Progress Notes (Addendum)
Pt HR sustaining at 120-130's and complaining of pain 8/10. Notify prime and talked to [name] and ordered IV metropolol 5 mg once and Toradol 15 mg once. Will continue to monitor.  Uodate 2058: Pt HR at 110-115 after IV metroplolo. Will continue to monitor.  Update 0452: Pt HR sustaining at 120-130. Notify Prime and talked to angela and ordered 5 mg IV metropolol once. Will continue to monitor.

## 2019-01-20 NOTE — Progress Notes (Signed)
Hematology/Oncology Follow Up Note [facility]  Telephone:([phone] [phone]  Patient Care Team: Bradly Chris as PCP - General (Family Medicine)   Name of the patient: Rhonda Martin  [id number]  27-Aug-1975   REASON FOR VISIT Follow up for thrombocytopenia  PERTINENT HEMATOLOGY HISTORY  # Patient  moved from [place] to [facility].  SHe has a history of Charcot-Marie-Tooth disease She informs me that she was previously told that she low platelet counts before.  reports easy bruising.  Showed me a bruise on her thigh. Today she feels very tired and fatigued, feels like she is almost going to pass out.  Multiple complaints today.  Occasionally she sees bright red blood in the stool.  No fever, chills,.  Chronic pain, she takes Aleve or naproxen with some relief.  Also reports stomach pain, associated with.  Acid reflux symptoms in the morning.  No exacerbating or alleviating factors. She drinks alcohol every day.  Hard liquor. Current every day smoker.  # Chronic pain  And muscle weakness due to Charcot-Marie-Tooth disease and who a month  INTERVAL HISTORY 43 y.o. female presents for follow up of thrombocytopenia, anemia Patient reports feeling horrible today.  She is shaking, pain 8 out of 10 all over. She has a hand tremor. Had a fall yesterday and was the ER.  She had rib fractures. Reports nausea, no vomiting.  Not able to eat anything at home. In the clinic she has a heart rate of 153 in office today. Reports that she has been drinking a lot of alcohol recently.  Last drink was yesterday.  Review of Systems  Constitutional: Positive for fatigue. Negative for appetite change, chills and fever.  HENT:   Negative for hearing loss and voice change.   Eyes: Negative for eye problems.  Respiratory: Negative for chest tightness and cough.   Cardiovascular: Negative for chest pain.  Gastrointestinal: Positive for blood in stool.  Negative for abdominal distention and abdominal pain.  Endocrine: Negative for hot flashes.  Genitourinary: Negative for difficulty urinating and frequency.   Musculoskeletal: Negative for arthralgias.  Skin: Negative for itching and rash.  Neurological: Negative for extremity weakness.  Hematological: Negative for adenopathy. Bruises/bleeds easily.  Psychiatric/Behavioral: Negative for confusion.     No Known Allergies   Past Medical History:  Diagnosis Date  . Back injury   . Cervical cancer ([facility])   . Charcot-Marie-Tooth disease   . Hypertension   . Leg injury      Past Surgical History:  Procedure Laterality Date  . BACK SURGERY    . LEG SURGERY      Social History   Socioeconomic History  . Marital status: Divorced    Spouse name: Not on file  . Number of children: Not on file  . Years of education: Not on file  . Highest education level: Not on file  Occupational History  . Not on file  Social Needs  . Financial resource strain: Not on file  . Food insecurity    Worry: Not on file    Inability: Not on file  . Transportation needs    Medical: Not on file    Non-medical: Not on file  Tobacco Use  . Smoking status: Current Every Day Smoker    Packs/day: 0.25    Years: 30.00    Pack years: 7.50    Types: Cigarettes  . Smokeless tobacco: Never Used  Substance and Sexual Activity  . Alcohol use: Yes  Alcohol/week: 5.0 standard drinks    Types: 5 Shots of liquor per week    Comment: 1 pint per day drinker. Last drink around 0300  . Drug use: Not Currently  . Sexual activity: Not Currently    Birth control/protection: I.U.D.  Lifestyle  . Physical activity    Days per week: Not on file    Minutes per session: Not on file  . Stress: Not on file  Relationships  . Social Musician on phone: Not on file    Gets together: Not on file    Attends religious service: Not on file    Active member of club or organization: Not on file    Attends  meetings of clubs or organizations: Not on file    Relationship status: Not on file  . Intimate partner violence    Fear of current or ex partner: Not on file    Emotionally abused: Not on file    Physically abused: Not on file    Forced sexual activity: Not on file  Other Topics Concern  . Not on file  Social History Narrative  . Not on file    Family History  Problem Relation Age of Onset  . Diabetes Mother   . Hypertension Mother   . Cancer Father        unknown what kind of cancer   . Hypertension Sister   . Hypertension Brother   . Heart attack Brother     No current facility-administered medications for this visit.   Current Outpatient Medications:  .  folic acid (FOLVITE) 1 MG tablet, Take 1 tablet (1 mg total) by mouth daily., Disp: 90 tablet, Rfl: 0 .  naproxen sodium (ALEVE) 220 MG tablet, Take 220 mg by mouth., Disp: , Rfl:  .  pantoprazole (PROTONIX) 40 MG tablet, Take 1 tablet (40 mg total) by mouth daily., Disp: 30 tablet, Rfl: 3 .  sucralfate (CARAFATE) 1 g tablet, Take 1 tablet (1 g total) by mouth 4 (four) times daily -  with meals and at bedtime., Disp: 120 tablet, Rfl: 1 .  HYDROcodone-acetaminophen (NORCO/VICODIN) 5-325 MG tablet, Take 1 tablet by mouth every 6 (six) hours as needed for moderate pain. (Patient not taking: Reported on 01/20/2019), Disp: 20 tablet, Rfl: 0  Facility-Administered Medications Ordered in Other Visits:  .  folic acid (FOLVITE) tablet 1 mg, 1 mg, Oral, Daily, Sharyn Creamer, MD, 1 mg at 01/20/19 1113 .  LORazepam (ATIVAN) injection 0-4 mg, 0-4 mg, Intravenous, Q6H, 2 mg at 01/20/19 1105 **FOLLOWED BY** [START ON 01/22/2019] LORazepam (ATIVAN) injection 0-4 mg, 0-4 mg, Intravenous, Q12H, Quale, Mark, MD .  LORazepam (ATIVAN) tablet 1 mg, 1 mg, Oral, Q6H PRN **OR** LORazepam (ATIVAN) injection 1 mg, 1 mg, Intravenous, Q6H PRN, Sharyn Creamer, MD .  magnesium sulfate IVPB 1 g 100 mL, 1 g, Intravenous, Once, Sharyn Creamer, MD .  multivitamin with  minerals tablet 1 tablet, 1 tablet, Oral, Daily, Sharyn Creamer, MD, 1 tablet at 01/20/19 1114 .  potassium chloride 10 mEq in 100 mL IVPB, 10 mEq, Intravenous, Once, Sharyn Creamer, MD, Last Rate: 100 mL/hr at 01/20/19 1300, 10 mEq at 01/20/19 1300 .  thiamine (VITAMIN B-1) tablet 100 mg, 100 mg, Oral, Daily **OR** thiamine (B-1) injection 100 mg, 100 mg, Intravenous, Daily, Sharyn Creamer, MD, 100 mg at 01/20/19 1115  Physical exam:  Vitals:   01/20/19 1001  BP: 112/81  Pulse: (!) 153  Resp: 20  Temp: 99.1 F (37.3  C)  Weight: 120 lb (54.4 kg)   Physical Exam Constitutional:      General: She is in acute distress.     Appearance: She is ill-appearing.  HENT:     Head: Normocephalic and atraumatic.  Eyes:     General: No scleral icterus.    Pupils: Pupils are equal, round, and reactive to light.  Neck:     Musculoskeletal: Normal range of motion and neck supple.  Cardiovascular:     Rate and Rhythm: Regular rhythm. Tachycardia present.     Heart sounds: Normal heart sounds.  Pulmonary:     Effort: Pulmonary effort is normal. No respiratory distress.     Breath sounds: No wheezing.  Abdominal:     General: Bowel sounds are normal. There is no distension.     Palpations: Abdomen is soft. There is no mass.     Tenderness: There is no abdominal tenderness.  Musculoskeletal: Normal range of motion.        General: No deformity.  Skin:    General: Skin is warm and dry.     Findings: Bruising present. No erythema or rash ( ).  Neurological:     General: No focal deficit present.     Mental Status: She is alert and oriented to person, place, and time.     Cranial Nerves: No cranial nerve deficit.     Coordination: Coordination normal.     Comments: Hand tremor  Psychiatric:     Comments: Depressed. No suicidal ideation.      CMP Latest Ref Rng & Units 01/20/2019  Glucose 70 - 99 mg/dL 161(W)  BUN 6 - 20 mg/dL <9(U)  Creatinine 0.45 - 1.00 mg/dL 4.09(W)  Sodium 119 - 147 mmol/L  136  Potassium 3.5 - 5.1 mmol/L 3.3(L)  Chloride 98 - 111 mmol/L 102  CO2 22 - 32 mmol/L 19(L)  Calcium 8.9 - 10.3 mg/dL 7.7(L)  Total Protein 6.5 - 8.1 g/dL 7.1  Total Bilirubin 0.3 - 1.2 mg/dL 5.9(H)  Alkaline Phos 38 - 126 U/L 220(H)  AST 15 - 41 U/L 291(H)  ALT 0 - 44 U/L 44   CBC Latest Ref Rng & Units 01/20/2019  WBC 4.0 - 10.5 K/uL 5.4  Hemoglobin 12.0 - 15.0 g/dL 8.2(N)  Hematocrit 56.2 - 46.0 % 30.4(L)  Platelets 150 - 400 K/uL 67(L)    Dg Shoulder Right  Result Date: 01/12/2019 CLINICAL DATA:  RIGHT shoulder pain and injury after slipping in shower and falling 2 nights ago, pain with movement EXAM: RIGHT SHOULDER - 2+ VIEW COMPARISON:  None FINDINGS: Osseous demineralization. AC joint alignment normal. No glenohumeral fracture or dislocation is identified. Clavicle and scapula appear intact. Minimally displaced fractures of the lateral RIGHT third and fourth ribs. Age-indeterminate fractures of the lateral RIGHT fifth and questionably sixth ribs. IMPRESSION: Acute fractures of the lateral RIGHT third and fourth ribs. Age-indeterminate fractures of the lateral RIGHT fifth and sixth ribs. Electronically Signed   By: Ulyses Southward M.D.   On: 01/12/2019 10:33   US Abdomen Complete  Result Date: 12/28/2018 CLINICAL DATA:  Chronic hepatitis-C EXAM: ABDOMEN ULTRASOUND COMPLETE COMPARISON:  None. FINDINGS: Gallbladder: 5 mm mobile stone within the gallbladder. No wall thickening. Common bile duct: Diameter: Normal caliber, 4 mm Liver: Enlarged. Diffusely increased echotexture. No focal hepatic abnormality. Portal vein is patent on color Doppler imaging with normal direction of blood flow towards the liver. IVC: No abnormality visualized. Pancreas: Visualized portion unremarkable. Spleen: Size and appearance within normal  limits. Right Kidney: Length: 11.0 cm. Echogenicity within normal limits. No mass or hydronephrosis visualized. Left Kidney: Length: 11.2 cm. Echogenicity within normal limits.  No mass or hydronephrosis visualized. Abdominal aorta: No aneurysm visualized. Other findings: None. IMPRESSION: Hepatomegaly. Diffusely increased echotexture compatible with fatty infiltration or intrinsic liver disease. No focal hepatic abnormality. Electronically Signed   By: Charlett Nose M.D.   On: 12/28/2018 14:33     Assessment and plan Patient is a 43 y.o. female presents to discuss lab results.  1. Tachycardia   2. Thrombocytopenia ([facility]CC)   3. Chronic hepatitis C without hepatic coma ([facility])   4. Alcohol abuse   5. Transaminitis   6. Tobacco abuse   7. Easy bruising   8. Folate deficiency    Patient is in acute distress.  She is tachycardic, regular rhythm by physical examination. Hand tremor History of alcohol abuse and multiple other medical comorbidities. I had a discussion with patient. Labs are reviewed. Chronic anemia and thrombocytopenia, both slightly worsened. She claims that she is taking folic acid. I recommend patient to go to emergency room for further evaluation of tachycardia, questionable alcohol withdrawal. ER was called and I updated the triage nurse.  We spent sufficient time to discuss many aspect of care, questions were answered to patient's satisfaction. Total face to face encounter time for this patient visit was 25 min. >50% of the time was  spent in counseling and coordination of care.    Rickard Patience, MD, PhD Hematology Oncology [facility] 01/20/2019

## 2019-01-20 NOTE — ED Triage Notes (Signed)
Reports alcohol withdrawal symptoms that started today. Last drink was last night, drinking a fifth of liquor daily. Wants assistance with detox. Denies drug use. Denies SI or HI. Pt appears uncomfortable. Endorses dry heaving, none observed.

## 2019-01-20 NOTE — ED Provider Notes (Signed)
[facility] Emergency Department Provider Note   ____________________________________________   First MD Initiated Contact with Patient 01/20/19 1036     (approximate)  I have reviewed the triage vital signs and the nursing notes.   HISTORY  Chief Complaint Withdrawal    HPI Rhonda Martin is a 43 y.o. female here for alcohol withdrawal  Patient reports she is been a very heavy drinker for over 3 years now, she saw her gastroenterologist this week and he told her that she needed to stop.  This morning after she got up she made this decision that she needed to stop drinking and quit cold [place].  She normally drinks 1/5 of vodka daily for the last several years.  Denies history of seizures.  She is been through withdrawal in the past.  She has not had any recent illness.  No fevers no chills no exposure to COVID  Since stopping drinking she is felt extremely shaky, tremulousness, feels her heart racing some nausea.  She is also felt like she is not having hallucinations per se but feels like little things in her vision that does seem a little off this morning  She is not any pain.   Past Medical History:  Diagnosis Date  . Back injury   . Cervical cancer ([place])   . Charcot-Marie-Tooth disease   . Hypertension   . Leg injury     Patient Active Problem List   Diagnosis Date Noted  . Chronic hepatitis C without hepatic coma ([place]) 12/20/2018  . Thrombocytopenia ([place]) 12/20/2018  . Alcohol abuse 12/20/2018  . Transaminitis 12/20/2018  . Tobacco abuse 12/20/2018  . Easy bruising 12/20/2018  . Other fatigue 12/20/2018  . Folate deficiency 12/20/2018  . Hepatitis B core antibody negative 12/20/2018  . Charcot-Marie-Tooth disease     Past Surgical History:  Procedure Laterality Date  . BACK SURGERY    . LEG SURGERY      Prior to Admission medications   Medication Sig Start Date End Date Taking? Authorizing Provider  folic acid (FOLVITE) 1 MG  tablet Take 1 tablet (1 mg total) by mouth daily. 12/20/18   Earlie Server, MD  HYDROcodone-acetaminophen (NORCO/VICODIN) 5-325 MG tablet Take 1 tablet by mouth every 6 (six) hours as needed for moderate pain. Patient not taking: Reported on 01/20/2019 01/12/19   Letitia Neri L, PA-C  naproxen sodium (ALEVE) 220 MG tablet Take 220 mg by mouth.    [provider]  pantoprazole (PROTONIX) 40 MG tablet Take 1 tablet (40 mg total) by mouth daily. 11/30/18   Volney American, PA-C  sucralfate (CARAFATE) 1 g tablet Take 1 tablet (1 g total) by mouth 4 (four) times daily -  with meals and at bedtime. 11/30/18   Volney American, PA-C    Allergies Patient has no known allergies.  Family History  Problem Relation Age of Onset  . Diabetes Mother   . Hypertension Mother   . Cancer Father        unknown what kind of cancer   . Hypertension Sister   . Hypertension Brother   . Heart attack Brother     Social History Social History   Tobacco Use  . Smoking status: Current Every Day Smoker    Packs/day: 0.25    Years: 30.00    Pack years: 7.50    Types: Cigarettes  . Smokeless tobacco: Never Used  Substance Use Topics  . Alcohol use: Yes    Alcohol/week: 5.0 standard  drinks    Types: 5 Shots of liquor per week    Comment: 1 pint per day drinker. Last drink around 0300  . Drug use: Not Currently    Review of Systems Constitutional: No fever/chills but reports feeling very shaky and tremulous Eyes: No visual changes. ENT: No sore throat. Cardiovascular: Denies chest pain. Respiratory: Denies shortness of breath. Gastrointestinal: No abdominal pain.  Some nausea. Genitourinary: Negative for dysuria. Musculoskeletal: Negative for back pain. Skin: Negative for rash. Neurological: Negative for headaches, areas of focal weakness or numbness.  Very tremulous.  Feels like she is seeing things slightly    ____________________________________________   PHYSICAL EXAM:   VITAL SIGNS: ED Triage Vitals  Enc Vitals Group     BP 01/20/19 1030 130/61     Pulse Rate 01/20/19 1030 (!) 150     Resp 01/20/19 1030 20     Temp 01/20/19 1030 99.5 F (37.5 C)     Temp Source 01/20/19 1030 Oral     SpO2 01/20/19 1030 99 %     Weight 01/20/19 1030 120 lb (54.4 kg)     Height 01/20/19 1030 5\' 2"  (1.575 m)     Head Circumference --      Peak Flow --      Pain Score 01/20/19 1032 8     Pain Loc --      Pain Edu? --      Excl. in [place]? --     Constitutional: Alert and oriented.  Tremulous, appears mildly toxic or ill.  No acute distress though.  No seizure activity.  Fully alert and oriented. Eyes: Conjunctivae are normal. Head: Atraumatic. Nose: No congestion/rhinnorhea. Mouth/Throat: Mucous membranes are moist. Neck: No stridor.  Cardiovascular: Tachycardic rate, regular rhythm. Grossly normal heart sounds.  Good peripheral circulation. Respiratory: Normal respiratory effort.  No retractions. Lungs CTAB. Gastrointestinal: Soft and nontender. No distention. Musculoskeletal: No lower extremity tenderness nor edema. Neurologic:  Normal speech and language. No gross focal neurologic deficits are appreciated.  She exhibits high-frequency tremors in both hands bilaterally. Skin:  Skin is warm, dry and intact. No rash noted. Psychiatric: Mood and affect are normal. Speech and behavior are normal.  Denies any desire to harm herself or others, stopped drinking at the request of her gastroenterologist yesterday.  ____________________________________________   LABS (all labs ordered are listed, but only abnormal results are displayed)  Labs Reviewed  CBC - Abnormal; Notable for the following components:      Result Value   RBC 2.73 (*)    Hemoglobin 9.8 (*)    HCT 30.4 (*)    MCV 111.4 (*)    MCH 35.9 (*)    Platelets 67 (*)    All other components within normal limits  COMPREHENSIVE METABOLIC PANEL - Abnormal; Notable for the following components:   Potassium  3.3 (*)    CO2 19 (*)    Glucose, Bld 198 (*)    BUN <5 (*)    Creatinine, Ser 0.33 (*)    Calcium 7.7 (*)    Albumin 2.4 (*)    AST 291 (*)    Alkaline Phosphatase 220 (*)    Total Bilirubin 5.9 (*)    All other components within normal limits  SARS CORONAVIRUS 2  LIPASE, BLOOD  PROTIME-INR   ____________________________________________  EKG  Reviewed and interpreted by me at 1035 Heart rate 150 QRS 70 QTc 530 Sinus tachycardia, nonspecific T wave abnormality.  No evidence of acute ischemia ____________________________________________  RADIOLOGY  ____________________________________________   PROCEDURES  Procedure(s) performed: None  Procedures  Critical Care performed: Yes, see critical care note(s)  CRITICAL CARE Performed by: Delman Kitten   Total critical care time: 30 minutes  Critical care time was exclusive of separately billable procedures and treating other patients.  Critical care was necessary to treat or prevent imminent or life-threatening deterioration.  Critical care was time spent personally by me on the following activities: development of treatment plan with patient and/or surrogate as well as nursing, discussions with consultants, evaluation of patient's response to treatment, examination of patient, obtaining history from patient or surrogate, ordering and performing treatments and interventions, ordering and review of laboratory studies, ordering and review of radiographic studies, pulse oximetry and re-evaluation of patient's condition.  Acute alcohol withdrawal, requiring IV benzodiazepines.  Admission to the hospital.  Patient starting to develop hallucinations and her visual perception.  Still however alert and oriented.  High risk for significant withdrawal and worsening delirium tremens   ____________________________________________   INITIAL IMPRESSION / ASSESSMENT AND PLAN / ED COURSE  Pertinent labs & imaging results that were  available during my care of the patient were reviewed by me and considered in my medical decision making (see chart for details).   Acute symptoms of alcohol withdrawal.  And complicated by some perceptual disturbances of vision.  She is alert and oriented, she is in no acute distress.  We are treating her with [place], banana bag, IV hydration.  Lab work reviewed she demonstrates thrombocytopenia which is known for her.  Also demonstrates transaminitis with elevated T bili which based on her clinical history I suspect is due to her chronic alcohol abuse.  We will also add an INR to evaluate synthetic function     DIESHA ROSTAD was evaluated in Emergency Department on 01/20/2019 for the symptoms described in the history of present illness. She was evaluated in the context of the global COVID-19 pandemic, which necessitated consideration that the patient might be at risk for infection with the SARS-CoV-2 virus that causes COVID-19. Institutional protocols and algorithms that pertain to the evaluation of patients at risk for COVID-19 are in a state of rapid change based on information released by regulatory bodies including the CDC and federal and state organizations. These policies and algorithms were followed during the patient's care in the ED.  Patient denies any recent illness other than abruptly stopping her alcohol.  Denies any COVID risk factors.  Denies any symptoms consistent with COVID.  COVID test sent 24-hour  ----------------------------------------- 12:24 PM on 01/20/2019 -----------------------------------------  Patient tremulous improving, she is alert, still somewhat tremulous and modestly tachycardic.  Discussed with Benjamine Mola the hospitalist, will admit for treatment with alcohol withdrawal, further work-up and care under the hospitalist service. ____________________________________________   FINAL CLINICAL IMPRESSION(S) / ED DIAGNOSES  Final diagnoses:  Alcohol withdrawal  syndrome with perceptual disturbance (HCC)  Transaminitis  Elevated bilirubin        Note:  This document was prepared using Dragon voice recognition software and may include unintentional dictation errors       Delman Kitten, MD 01/20/19 1225

## 2019-01-20 NOTE — Progress Notes (Signed)
Patient states she feels horrible and feels like she is shaking.  Pain 8/10 in abdomen today and does not have any pain medication.  Had a recent fall and went to ER where diagnosed with 2 broken ribs and hurt right shoulder.  Patient reports she is not able to stand for a weight today but 120 lb is recorded in chart from yesterday.  Heart rate is 153 in office today.

## 2019-01-20 NOTE — ED Notes (Signed)
ED TO INPATIENT HANDOFF REPORT  ED Nurse Name and Phone #: Sidhant Helderman [phone]  S Name/Age/Gender Rhonda Martin 43 y.o. female Room/Bed: ED24A/ED24A  Code Status   Code Status: Full Code  Home/SNF/Other Home  Patient oriented to: self Is this baseline? Yes   Triage Complete: Triage complete  Chief Complaint brought by kc/withdrawal  Triage Note Reports alcohol withdrawal symptoms that started today. Last drink was last night, drinking a fifth of liquor daily. Wants assistance with detox. Denies drug use. Denies SI or HI. Pt appears uncomfortable. Endorses dry heaving, none observed.   Allergies No Known Allergies  Level of Care/Admitting Diagnosis ED Disposition    ED Disposition Condition [facility] Area: [place] [[id number]]  Level of Care: Med-Surg [16]  Covid Evaluation: Asymptomatic Screening Protocol (No Symptoms)  Diagnosis: Alcohol withdrawal syndrome [facility]) [[id number]]  Admitting Physician: Eula Flax  Attending Physician: Rufina Falco ACHIENG [phone]  Estimated length of stay: past midnight tomorrow  Certification:: I certify this patient will need inpatient services for at least 2 midnights  PT Class (Do Not Modify): Inpatient [101]  PT Acc Code (Do Not Modify): Private [1]       B Medical/Surgery History Past Medical History:  Diagnosis Date  . Back injury   . Cervical cancer ([place])   . Charcot-Marie-Tooth disease   . Hypertension   . Leg injury    Past Surgical History:  Procedure Laterality Date  . BACK SURGERY    . LEG SURGERY       A IV Location/Drains/Wounds Patient Lines/Drains/Airways Status   Active Line/Drains/Airways    Name:   Placement date:   Placement time:   Site:   Days:   Peripheral IV 01/20/19 Left Antecubital   01/20/19    1046    Antecubital   less than 1          Intake/Output Last 24 hours No intake or output data in the 24 hours ending 01/20/19  1404  Labs/Imaging Results for orders placed or performed during the hospital encounter of 01/20/19 (from the past 48 hour(s))  CBC     Status: Abnormal   Collection Time: 01/20/19 10:45 AM  Result Value Ref Range   WBC 5.4 4.0 - 10.5 K/uL   RBC 2.73 (L) 3.87 - 5.11 MIL/uL   Hemoglobin 9.8 (L) 12.0 - 15.0 g/dL   HCT 30.4 (L) 36.0 - 46.0 %   MCV 111.4 (H) 80.0 - 100.0 fL   MCH 35.9 (H) 26.0 - 34.0 pg   MCHC 32.2 30.0 - 36.0 g/dL   RDW 12.8 11.5 - 15.5 %   Platelets 67 (L) 150 - 400 K/uL    Comment: Immature Platelet Fraction may be clinically indicated, consider ordering this additional test [id number]    nRBC 0.0 0.0 - 0.2 %    Comment: Performed at [facility], [address] [place], [place] [address]  Comprehensive metabolic panel     Status: Abnormal   Collection Time: 01/20/19 10:45 AM  Result Value Ref Range   Sodium 136 135 - 145 mmol/L   Potassium 3.3 (L) 3.5 - 5.1 mmol/L   Chloride 102 98 - 111 mmol/L   CO2 19 (L) 22 - 32 mmol/L   Glucose, Bld 198 (H) 70 - 99 mg/dL   BUN <5 (L) 6 - 20 mg/dL   Creatinine, Ser 0.33 (L) 0.44 - 1.00 mg/dL   Calcium 7.7 (L) 8.9 - 10.3 mg/dL  Total Protein 7.1 6.5 - 8.1 g/dL   Albumin 2.4 (L) 3.5 - 5.0 g/dL   AST 291 (H) 15 - 41 U/L   ALT 44 0 - 44 U/L   Alkaline Phosphatase 220 (H) 38 - 126 U/L   Total Bilirubin 5.9 (H) 0.3 - 1.2 mg/dL   GFR calc non Af Amer >60 >60 mL/min   GFR calc Af Amer >60 >60 mL/min   Anion gap 15 5 - 15    Comment: Performed at [facility], [address] [place], [place] [address]  Lipase, blood     Status: None   Collection Time: 01/20/19 10:45 AM  Result Value Ref Range   Lipase 42 11 - 51 U/L    Comment: Performed at [facility], [address] [place],  [address]   No results found.  Pending Labs Unresulted Labs (From admission, onward)    Start     Ordered   01/20/19 1225  Protime-INR  Add-on,   AD     01/20/19 1224   01/20/19 1052  SARS CORONAVIRUS 2  Nasal Swab Aptima Multi Swab  (Asymptomatic/Tier 2)  Once,   STAT    Question Answer Comment  Is this test for diagnosis or screening Screening   Symptomatic for COVID-19 as defined by CDC No   Hospitalized for COVID-19 No   Admitted to ICU for COVID-19 No   Previously tested for COVID-19 No   Resident in a congregate (group) care setting Unknown   Employed in healthcare setting Unknown   Pregnant Unknown      01/20/19 1051          Vitals/Pain Today's Vitals   01/20/19 1124 01/20/19 1228 01/20/19 1230 01/20/19 1330  BP: (!) 143/102 94/66 118/79 130/74  Pulse: (!) 130 (!) 129 (!) 127   Resp: 16 20 18  (!) 27  Temp:      TempSrc:      SpO2: 98% 95% 96%   Weight:      Height:      PainSc:        Isolation Precautions No active isolations  Medications Medications  LORazepam (ATIVAN) tablet 1 mg (has no administration in time range)    Or  LORazepam (ATIVAN) injection 1 mg (has no administration in time range)  thiamine (VITAMIN B-1) tablet 100 mg ( Oral See Alternative 01/20/19 1115)    Or  thiamine (B-1) injection 100 mg (100 mg Intravenous Given 01/09/20 2248)  folic acid (FOLVITE) tablet 1 mg (1 mg Oral Given 01/20/19 1113)  multivitamin with minerals tablet 1 tablet (1 tablet Oral Given 01/20/19 1114)  LORazepam (ATIVAN) injection 0-4 mg (2 mg Intravenous Given 01/20/19 1105)    Followed by  LORazepam (ATIVAN) injection 0-4 mg (has no administration in time range)  magnesium sulfate IVPB 1 g 100 mL (1 g Intravenous New Bag/Given 01/20/19 1400)  pantoprazole (PROTONIX) EC tablet 40 mg (has no administration in time range)  sucralfate (CARAFATE) tablet 1 g (has no administration in time range)  folic acid (FOLVITE) tablet 1 mg (has no administration in time range)  sodium chloride 0.9 % 1,000 mL with thiamine 250 mg, folic acid 1 mg, multivitamins adult 10 mL infusion ( Intravenous New Bag/Given 01/20/19 1117)  LORazepam (ATIVAN) injection 1 mg (1 mg Intravenous Given  01/20/19 1226)  sodium chloride 0.9 % bolus 1,000 mL (0 mLs Intravenous Stopped 01/20/19 1355)  potassium chloride 10 mEq in 100 mL IVPB (0 mEq Intravenous Stopped 01/20/19  1356)    Mobility {Mobility: walks  Low fall risk   Focused Assessments Pt exhibiting withdrawal traits (trembling)   R Recommendations: See Admitting Provider Note  Report given to:   Additional Notes: none   ED TO INPATIENT HANDOFF REPORT  ED Nurse Name and Phone #: Mariadelcarmen Corella   S Name/Age/Gender Rhonda Martin 43 y.o. female Room/Bed: ED24A/ED24A  Code Status   Code Status: Full Code  Home/SNF/Other home Patient oriented to: self, place, time and situation Is this baseline? yes  Triage Complete: Triage complete  Chief Complaint brought by kc/withdrawal  Triage Note Reports alcohol withdrawal symptoms that started today. Last drink was last night, drinking a fifth of liquor daily. Wants assistance with detox. Denies drug use. Denies SI or HI. Pt appears uncomfortable. Endorses dry heaving, none observed.   Allergies No Known Allergies  Level of Care/Admitting Diagnosis ED Disposition    ED Disposition Condition [facility]: [place] [[id number]]  Level of Care: Med-Surg [16]  Covid Evaluation: Asymptomatic Screening Protocol (No Symptoms)  Diagnosis: Alcohol withdrawal syndrome [facility]) [[id number]]  Admitting Physician: Eula Flax  Attending Physician: Rufina Falco ACHIENG [phone]  Estimated length of stay: past midnight tomorrow  Certification:: I certify this patient will need inpatient services for at least 2 midnights  PT Class (Do Not Modify): Inpatient [101]  PT Acc Code (Do Not Modify): Private [1]       B Medical/Surgery History Past Medical History:  Diagnosis Date  . Back injury   . Cervical cancer ([place])   . Charcot-Marie-Tooth disease   . Hypertension   . Leg injury    Past Surgical History:  Procedure  Laterality Date  . BACK SURGERY    . LEG SURGERY       A IV Location/Drains/Wounds Patient Lines/Drains/Airways Status   Active Line/Drains/Airways    Name:   Placement date:   Placement time:   Site:   Days:   Peripheral IV 01/20/19 Left Antecubital   01/20/19    1046    Antecubital   less than 1          Intake/Output Last 24 hours No intake or output data in the 24 hours ending 01/20/19 1404  Labs/Imaging Results for orders placed or performed during the hospital encounter of 01/20/19 (from the past 48 hour(s))  CBC     Status: Abnormal   Collection Time: 01/20/19 10:45 AM  Result Value Ref Range   WBC 5.4 4.0 - 10.5 K/uL   RBC 2.73 (L) 3.87 - 5.11 MIL/uL   Hemoglobin 9.8 (L) 12.0 - 15.0 g/dL   HCT 30.4 (L) 36.0 - 46.0 %   MCV 111.4 (H) 80.0 - 100.0 fL   MCH 35.9 (H) 26.0 - 34.0 pg   MCHC 32.2 30.0 - 36.0 g/dL   RDW 12.8 11.5 - 15.5 %   Platelets 67 (L) 150 - 400 K/uL    Comment: Immature Platelet Fraction may be clinically indicated, consider ordering this additional test [id number]    nRBC 0.0 0.0 - 0.2 %    Comment: Performed at [facility], [address] [place], [place] [address]  Comprehensive metabolic panel     Status: Abnormal   Collection Time: 01/20/19 10:45 AM  Result Value Ref Range   Sodium 136 135 - 145 mmol/L   Potassium 3.3 (L) 3.5 - 5.1 mmol/L   Chloride 102 98 - 111 mmol/L   CO2 19 (L) 22 -  32 mmol/L   Glucose, Bld 198 (H) 70 - 99 mg/dL   BUN <5 (L) 6 - 20 mg/dL   Creatinine, Ser 0.33 (L) 0.44 - 1.00 mg/dL   Calcium 7.7 (L) 8.9 - 10.3 mg/dL   Total Protein 7.1 6.5 - 8.1 g/dL   Albumin 2.4 (L) 3.5 - 5.0 g/dL   AST 291 (H) 15 - 41 U/L   ALT 44 0 - 44 U/L   Alkaline Phosphatase 220 (H) 38 - 126 U/L   Total Bilirubin 5.9 (H) 0.3 - 1.2 mg/dL   GFR calc non Af Amer >60 >60 mL/min   GFR calc Af Amer >60 >60 mL/min   Anion gap 15 5 - 15    Comment: Performed at [facility], [address] [place], [place] [address]   Lipase, blood     Status: None   Collection Time: 01/20/19 10:45 AM  Result Value Ref Range   Lipase 42 11 - 51 U/L    Comment: Performed at [facility], [address] [place], [place] [address]   No results found.  Pending Labs Unresulted Labs (From admission, onward)    Start     Ordered   01/20/19 1225  Protime-INR  Add-on,   AD     01/20/19 1224   01/20/19 1052  SARS CORONAVIRUS 2 Nasal Swab Aptima Multi Swab  (Asymptomatic/Tier 2)  Once,   STAT    Question Answer Comment  Is this test for diagnosis or screening Screening   Symptomatic for COVID-19 as defined by CDC No   Hospitalized for COVID-19 No   Admitted to ICU for COVID-19 No   Previously tested for COVID-19 No   Resident in a congregate (group) care setting Unknown   Employed in healthcare setting Unknown   Pregnant Unknown      01/20/19 1051          Vitals/Pain Today's Vitals   01/20/19 1124 01/20/19 1228 01/20/19 1230 01/20/19 1330  BP: (!) 143/102 94/66 118/79 130/74  Pulse: (!) 130 (!) 129 (!) 127   Resp: 16 20 18  (!) 27  Temp:      TempSrc:      SpO2: 98% 95% 96%   Weight:      Height:      PainSc:        Isolation Precautions No active isolations  Medications Medications  LORazepam (ATIVAN) tablet 1 mg (has no administration in time range)    Or  LORazepam (ATIVAN) injection 1 mg (has no administration in time range)  thiamine (VITAMIN B-1) tablet 100 mg ( Oral See Alternative 01/20/19 1115)    Or  thiamine (B-1) injection 100 mg (100 mg Intravenous Given 7/49/44 9675)  folic acid (FOLVITE) tablet 1 mg (1 mg Oral Given 01/20/19 1113)  multivitamin with minerals tablet 1 tablet (1 tablet Oral Given 01/20/19 1114)  LORazepam (ATIVAN) injection 0-4 mg (2 mg Intravenous Given 01/20/19 1105)    Followed by  LORazepam (ATIVAN) injection 0-4 mg (has no administration in time range)  magnesium sulfate IVPB 1 g 100 mL (1 g Intravenous New Bag/Given 01/20/19 1400)  pantoprazole  (PROTONIX) EC tablet 40 mg (has no administration in time range)  sucralfate (CARAFATE) tablet 1 g (has no administration in time range)  folic acid (FOLVITE) tablet 1 mg (has no administration in time range)  sodium chloride 0.9 % 1,000 mL with thiamine 916 mg, folic acid 1 mg, multivitamins adult 10 mL infusion ( Intravenous New Bag/Given  01/20/19 1117)  LORazepam (ATIVAN) injection 1 mg (1 mg Intravenous Given 01/20/19 1226)  sodium chloride 0.9 % bolus 1,000 mL (0 mLs Intravenous Stopped 01/20/19 1355)  potassium chloride 10 mEq in 100 mL IVPB (0 mEq Intravenous Stopped 01/20/19 1356)    Mobility walks Low fall risk   Focused Assessments    R Recommendations: See Admitting Provider Note  Report given to:   Additional Notes:  None

## 2019-01-21 LAB — GLUCOSE, CAPILLARY: Glucose-Capillary: 129 mg/dL — ABNORMAL HIGH (ref 70–99)

## 2019-01-21 LAB — BASIC METABOLIC PANEL
Anion gap: 7 (ref 5–15)
BUN: <5 mg/dL — ABNORMAL LOW (ref 6–20)
CO2: 24 mmol/L (ref 22–32)
Calcium: 7.1 mg/dL — ABNORMAL LOW (ref 8.9–10.3)
Chloride: 105 mmol/L (ref 98–111)
Creatinine, Ser: <0.3 mg/dL — ABNORMAL LOW (ref 0.44–1.00)
Glucose, Bld: 89 mg/dL (ref 70–99)
Potassium: 3.5 mmol/L (ref 3.5–5.1)
Sodium: 136 mmol/L (ref 135–145)

## 2019-01-21 LAB — MAGNESIUM: Magnesium: 1.7 mg/dL (ref 1.7–2.4)

## 2019-01-21 MED ORDER — TRAMADOL HCL 50 MG PO TABS: 50.0000 mg | ORAL_TABLET | Freq: Four times a day (QID) | ORAL | Status: DC | PRN

## 2019-01-21 MED ORDER — METOPROLOL TARTRATE 25 MG PO TABS
25.0000 mg | ORAL_TABLET | Freq: Two times a day (BID) | ORAL | Status: DC
  Administered 2019-01-21 – 2019-01-26 (×10): 25 mg via ORAL
  Filled 2019-01-21 (×11): qty 1

## 2019-01-21 MED ORDER — METOPROLOL TARTRATE 5 MG/5ML IV SOLN
5.0000 mg | Freq: Once | INTRAVENOUS | Status: AC
  Administered 2019-01-21: 5 mg via INTRAVENOUS
  Filled 2019-01-21: qty 5

## 2019-01-21 MED ORDER — DIPHENOXYLATE-ATROPINE 2.5-0.025 MG PO TABS
1.0000 | ORAL_TABLET | Freq: Three times a day (TID) | ORAL | Status: DC | PRN
  Administered 2019-01-21 – 2019-01-26 (×2): 1 via ORAL
  Filled 2019-01-21 (×3): qty 1

## 2019-01-21 MED ORDER — ACETAMINOPHEN 325 MG PO TABS
650.0000 mg | ORAL_TABLET | Freq: Four times a day (QID) | ORAL | Status: DC | PRN
  Administered 2019-01-22 (×2): 650 mg via ORAL
  Filled 2019-01-21 (×2): qty 2

## 2019-01-21 MED ORDER — CHLORDIAZEPOXIDE HCL 25 MG PO CAPS
25.0000 mg | ORAL_CAPSULE | Freq: Three times a day (TID) | ORAL | Status: DC
  Administered 2019-01-21 – 2019-01-22 (×3): 25 mg via ORAL
  Filled 2019-01-21 (×3): qty 1

## 2019-01-21 MED ORDER — MAGNESIUM OXIDE 400 (241.3 MG) MG PO TABS
800.0000 mg | ORAL_TABLET | Freq: Once | ORAL | Status: AC
  Administered 2019-01-21: 800 mg via ORAL
  Filled 2019-01-21: qty 2

## 2019-01-21 MED ORDER — NICOTINE 14 MG/24HR TD PT24
14.0000 mg | MEDICATED_PATCH | Freq: Every day | TRANSDERMAL | Status: DC
  Administered 2019-01-21 – 2019-01-22 (×2): 14 mg via TRANSDERMAL
  Filled 2019-01-21 (×6): qty 1

## 2019-01-21 NOTE — Plan of Care (Signed)
  Problem: Education: Goal: Knowledge of General Education information will improve Description: Including pain rating scale, medication(s)/side effects and non-pharmacologic comfort measures Outcome: Progressing   Problem: Pain Managment: Goal: General experience of comfort will improve Outcome: Progressing   Problem: Safety: Goal: Ability to remain free from injury will improve Outcome: Progressing   

## 2019-01-21 NOTE — TOC Initial Note (Signed)
Transition of Care [facility]) - Initial/Assessment Note    Patient Details  Name: Rhonda Martin MRN: [id number] Date of Birth: Jan 15, 1976  Transition of Care [facility]) CM/SW Contact:    Latanya Maudlin, RN Phone Number: 01/21/2019, 10:49 AM  Clinical Narrative:  [facility] team consulted to assist with alcohol abuse resources. Patient had reported drinking a fifth of hard liquor daily. She is currently going through withdrawals and is under the CIWA protocol. She lives at home with her boyfriend, Nathaneil Canary. She reports she is not the victim of any abuse and reports they are both "bad drinkers". I have provided several resources for alcohol abuse. Patient is undecided if she wants to go to an actual facility or seek treatment a different way.Otherwise, patient has no further TOC team needs. She is independent with activities of daily living, uses no DME. Has a PCP and still drives.                Expected Discharge Plan: Home/Self Care Barriers to Discharge: Continued Medical Work up   Patient Goals and CMS Choice Patient states their goals for this hospitalization and ongoing recovery are:: to get better and go somewhere CMS Medicare.gov Compare Post Acute Care list provided to:: Patient Choice offered to / list presented to : Patient  Expected Discharge Plan and Services Expected Discharge Plan: Home/Self Care In-house Referral: Clinical Social Work Discharge Planning Services: CM Consult   Living arrangements for the past 2 months: Mobile Home Expected Discharge Date: 01/25/19                                    Prior Living Arrangements/Services Living arrangements for the past 2 months: Mobile Home Lives with:: Significant Other Patient language and need for interpreter reviewed:: Yes Do you feel safe going back to the place where you live?: Yes      Need for Family Participation in Patient Care: No (Comment)     Criminal Activity/Legal Involvement Pertinent to Current  Situation/Hospitalization: No - Comment as needed  Activities of Daily Living Home Assistive Devices/Equipment: None ADL Screening (condition at time of admission) Patient's cognitive ability adequate to safely complete daily activities?: Yes Is the patient deaf or have difficulty hearing?: No Does the patient have difficulty seeing, even when wearing glasses/contacts?: No Does the patient have difficulty concentrating, remembering, or making decisions?: No Patient able to express need for assistance with ADLs?: Yes Does the patient have difficulty dressing or bathing?: No Independently performs ADLs?: Yes (appropriate for developmental age) Does the patient have difficulty walking or climbing stairs?: No Weakness of Legs: Both Weakness of Arms/Hands: Both  Permission Sought/Granted Permission sought to share information with : Case Manager                Emotional Assessment Appearance:: Appears older than stated age, Disheveled Attitude/Demeanor/Rapport: Unable to Assess Affect (typically observed): Flat Orientation: : Oriented to Self, Oriented to Place, Oriented to  Time, Oriented to Situation, Fluctuating Orientation (Suspected and/or reported Sundowners)      Admission diagnosis:  Transaminitis [R74.0] Elevated bilirubin [R17] Alcohol withdrawal syndrome with perceptual disturbance ([place]) [F10.232] Patient Active Problem List   Diagnosis Date Noted  . Alcohol withdrawal syndrome ([place]) 01/20/2019  . Chronic hepatitis C without hepatic coma ([place]) 12/20/2018  . Thrombocytopenia ([place]) 12/20/2018  . Alcohol abuse 12/20/2018  . Transaminitis 12/20/2018  . Tobacco abuse 12/20/2018  . Easy bruising 12/20/2018  .  Other fatigue 12/20/2018  . Folate deficiency 12/20/2018  . Hepatitis B core antibody negative 12/20/2018  . Charcot-Marie-Tooth disease    PCP:  Volney American, PA-C Pharmacy:   [id number] - [place], [place] [address] Phone: [phone] Fax: [phone]     Social Determinants of Health (SDOH) Interventions    Readmission Risk Interventions Readmission Risk Prevention Plan 01/21/2019  Post Dischage Appt Complete  Medication Screening Complete  Transportation Screening Complete

## 2019-01-21 NOTE — Progress Notes (Signed)
[place] at [place] NAME: Rhonda Martin    MR#:  [id number]  DATE OF BIRTH:  20-Jun-1975  SUBJECTIVE:  CHIEF COMPLAINT:   Chief Complaint  Patient presents with  . Withdrawal    right-sided chest pain.  Feels weak.  REVIEW OF SYSTEMS:    Review of Systems  Constitutional: Positive for malaise/fatigue. Negative for chills and fever.  HENT: Negative for sore throat.   Eyes: Negative for blurred vision, double vision and pain.  Respiratory: Negative for cough, hemoptysis, shortness of breath and wheezing.   Cardiovascular: Negative for chest pain, palpitations, orthopnea and leg swelling.  Gastrointestinal: Negative for abdominal pain, constipation, diarrhea, heartburn, nausea and vomiting.  Genitourinary: Negative for dysuria and hematuria.  Musculoskeletal: Negative for back pain and joint pain.  Skin: Negative for rash.  Neurological: Positive for dizziness and tremors. Negative for sensory change, speech change, focal weakness and headaches.  Endo/Heme/Allergies: Does not bruise/bleed easily.  Psychiatric/Behavioral: Negative for depression. The patient is not nervous/anxious.     DRUG ALLERGIES:  No Known Allergies  VITALS:  Blood pressure 112/87, pulse 95, temperature 98.9 F (37.2 C), temperature source Oral, resp. rate (!) 21, height 5\' 2"  (1.575 m), weight 57 kg, SpO2 96 %.  PHYSICAL EXAMINATION:   Physical Exam  GENERAL:  43 y.o.-year-old patient lying in the bed. EYES: Pupils equal, round, reactive to light and accommodation. No scleral icterus. Extraocular muscles intact.  HEENT: Head atraumatic, normocephalic. Oropharynx and nasopharynx clear.  NECK:  Supple, no jugular venous distention. No thyroid enlargement, no tenderness.  LUNGS: Normal breath sounds bilaterally, no wheezing, rales, rhonchi. No use of accessory muscles of respiration.  CARDIOVASCULAR: S1, S2 , tachycardia ABDOMEN: Soft, nontender, nondistended.  Bowel sounds present. No organomegaly or mass.  EXTREMITIES: No cyanosis, clubbing or edema b/l.    NEUROLOGIC: Cranial nerves II through XII are intact. No focal Motor or sensory deficits b/l.   Tremors PSYCHIATRIC: The patient is alert and oriented x 3.  Anxious SKIN: No obvious rash, lesion, or ulcer.   LABORATORY PANEL:   CBC Recent Labs  Lab 01/20/19 1045  WBC 5.4  HGB 9.8*  HCT 30.4*  PLT 67*   ------------------------------------------------------------------------------------------------------------------ Chemistries  Recent Labs  Lab 01/20/19 1045 01/21/19 0710  NA 136 136  K 3.3* 3.5  CL 102 105  CO2 19* 24  GLUCOSE 198* 89  BUN <5* <5*  CREATININE 0.33* <0.30*  CALCIUM 7.7* 7.1*  MG  --  1.7  AST 291*  --   ALT 44  --   ALKPHOS 220*  --   BILITOT 5.9*  --    ------------------------------------------------------------------------------------------------------------------  Cardiac Enzymes No results for input(s): TROPONINI in the last 168 hours. ------------------------------------------------------------------------------------------------------------------  RADIOLOGY:  No results found.   ASSESSMENT AND PLAN:   43 y.o. female with past medical history of chronic hepatitis C, CMT, hypertension, transaminitis, folate deficiency, EtOH abuse, tobacco abuse, rectal bleed and malnutrition presenting to the ED with EtOH withdrawal.  * Sinus tachycardia Due to alcohol withdrawal Patient given stat doses of IV metoprolol twice.  Some improvement.  Will start scheduled oral metoprolol twice daily.  * Alcohol abuse and withdrawal Average Drinks Per Day 1 L Vodka per day No Hx Seizures   On CIWA protocol Daily Thiamine, Folate - SW consult for cessation resources - PT/OT evaluation for mobility  * Chronic thrombocytopenia - likely due to alcohol usage - Follows with Dr. Tasia Catchings - No signs of bleeding we  will continue to monitor  *Recent diagnosis of  right rib fractures.  Added tramadol as needed.  Incentive spirometer.  * Hypokalemia  Replaced  * Alcoholic gastritis -continue Protonix and Carafate  * HTN Start metoprolol  * Polysubstance abuse -Counseling provided -Social work referral  * Intermittent rectal bleed - Following with GI Dr. Vicente Males - OP f/u -We will consult GI if significant drop in hemoglobin  * Elevated Bilirubin due to alcohol - Monitor  * DVT prophylaxis - Hold anti-coagulation due to  thrombocytopenia (PLT <100k)  All the records are reviewed and case discussed with Care Management/Social Worker Management plans discussed with the patient, family and they are in agreement.  CODE STATUS: FULL CODE  TOTAL TIME TAKING CARE OF THIS PATIENT: 35 minutes.   POSSIBLE D/C IN  DAYS, DEPENDING ON CLINICAL CONDITION.  [name] M.D on 01/21/2019 at 12:48 PM  Between 7am to 6pm - Pager - [phone]  After 6pm go to [email or web address] - password [email or web address] [facility]  Office  [phone]  CC: Primary care physician; Volney American, PA-C  Note: This dictation was prepared with Dragon dictation along with smaller phrase technology. Any transcriptional errors that result from this process are unintentional.

## 2019-01-21 NOTE — Progress Notes (Signed)
Patient states she feels much better and wants to go home.  She had gathered her belonging and seemed ready to go. States her fiance was in the parking lot to take her home. Explained the risks of going home at this point. She disagreed to everything I said.  Contacted Dr. Darvin Neighbours who came up to see her immediately.  He explained that he wanted to see her heart rate come down before she went home and if it improved he would discharge her tomorrow. He order Tramadol prn for pain, a Nicotine patch and Librium. I moved the patient to room 245 to be closer to the nursing station and for a more reliable bed alarm.

## 2019-01-21 NOTE — Progress Notes (Addendum)
PT Cancellation Note  Patient Details Name: Rhonda Martin MRN: [id number] DOB: 1976-01-07   Cancelled Treatment:    Reason Eval/Treat Not Completed: Patient's level of consciousness;Pain limiting ability to participate. Chart reviewed, RN consulted. Pt found AMB in room ad lib to holler out into hallway for some Neosporin, then prior to author entering room, pt somehow got back into bed over 4-raised bed rails. Pt found with 3 leads pulled off (2 others not visualized), smears of feces on heel of left sock, inside of gown, and floor tile. Also noted a penny-sized bright red blood stain on gown, origins unclear. Pt somewhat anxious and writhing a bit in bed, c/o pain and sleeplessness. Author able to establish a bit of history regarding ADL independence and CMT involvement of BUE which has been severe for >20 years, but pt's ability to respond in a coherent way quickly begins to dissipate. Noted CMT deficits to Bilat ankles, but unable to obtain detail.   RN made aware of the following:  1. code brown situation, pt in need of assistance 2. Leads not attached 3. Baseline difficulty with BUE grip strength and fine motor coordination 4. Pt request for Neosporin and pain meds.   Will attempt PT evaluation at later date/time once patient's AMS is no longer interfering with her ability to participate.   4:23 PM, 01/21/19 Etta Grandchild, PT, DPT Physical Therapist - [facility]  [phone] ([place])    Tyler C 01/21/2019, 4:13 PM

## 2019-01-22 MED ORDER — SODIUM CHLORIDE 0.9 % IV BOLUS
500.0000 mL | Freq: Once | INTRAVENOUS | Status: AC
  Administered 2019-01-22: 500 mL via INTRAVENOUS

## 2019-01-22 MED ORDER — CHLORDIAZEPOXIDE HCL 25 MG PO CAPS
25.0000 mg | ORAL_CAPSULE | Freq: Four times a day (QID) | ORAL | Status: DC
  Administered 2019-01-22 – 2019-01-26 (×17): 25 mg via ORAL
  Filled 2019-01-22 (×17): qty 1

## 2019-01-22 NOTE — Progress Notes (Signed)
PT Cancellation Note  Patient Details Name: Rhonda Martin MRN: [id number] DOB: 01/22/1976   Cancelled Treatment:    Reason Eval/Treat Not Completed: Other (comment). Evaluation attempted, pt very lethargic, sleeping in bed. Arousable and politely declines PT reporting she has returned back to baseline, has been ambulatory in room and has no needs. Wishes to remain in bed this date. Communicated with RN to determine skillable needs. Per RN, they haven't witnessed pt OOB and request therapy re-attempt next date. Will make last attempt tomorrow for mobility efforts.   Ginger Leeth 01/22/2019, 2:20 PM Greggory Stallion, PT, DPT [phone]

## 2019-01-22 NOTE — Progress Notes (Signed)
[place] at [place] NAME: Rhonda Martin    MR#:  [id number]  DATE OF BIRTH:  26-Feb-1976  SUBJECTIVE:  CHIEF COMPLAINT:   Chief Complaint  Patient presents with  . Withdrawal   Right-sided chest pain is improved.  Continues to have shaking and tachycardia.  Able to ambulate.  REVIEW OF SYSTEMS:    Review of Systems  Constitutional: Positive for malaise/fatigue. Negative for chills and fever.  HENT: Negative for sore throat.   Eyes: Negative for blurred vision, double vision and pain.  Respiratory: Negative for cough, hemoptysis, shortness of breath and wheezing.   Cardiovascular: Negative for chest pain, palpitations, orthopnea and leg swelling.  Gastrointestinal: Negative for abdominal pain, constipation, diarrhea, heartburn, nausea and vomiting.  Genitourinary: Negative for dysuria and hematuria.  Musculoskeletal: Negative for back pain and joint pain.  Skin: Negative for rash.  Neurological: Positive for dizziness and tremors. Negative for sensory change, speech change, focal weakness and headaches.  Endo/Heme/Allergies: Does not bruise/bleed easily.  Psychiatric/Behavioral: Negative for depression. The patient is not nervous/anxious.     DRUG ALLERGIES:  No Known Allergies  VITALS:  Blood pressure 107/71, pulse (!) 108, temperature 99.5 F (37.5 C), temperature source Oral, resp. rate 18, height 5\' 2"  (1.575 m), weight 53.5 kg, SpO2 97 %.  PHYSICAL EXAMINATION:   Physical Exam  GENERAL:  43 y.o.-year-old patient lying in the bed. EYES: Pupils equal, round, reactive to light and accommodation. No scleral icterus. Extraocular muscles intact.  HEENT: Head atraumatic, normocephalic. Oropharynx and nasopharynx clear.  NECK:  Supple, no jugular venous distention. No thyroid enlargement, no tenderness.  LUNGS: Normal breath sounds bilaterally, no wheezing, rales, rhonchi. No use of accessory muscles of respiration.  CARDIOVASCULAR:  S1, S2 , tachycardia ABDOMEN: Soft, nontender, nondistended. Bowel sounds present. No organomegaly or mass.  EXTREMITIES: No cyanosis, clubbing or edema b/l.    NEUROLOGIC: Cranial nerves II through XII are intact. No focal Motor or sensory deficits b/l.   Tremors PSYCHIATRIC: The patient is alert and oriented x 3.  Anxious SKIN: No obvious rash, lesion, or ulcer.   LABORATORY PANEL:   CBC Recent Labs  Lab 01/20/19 1045  WBC 5.4  HGB 9.8*  HCT 30.4*  PLT 67*   ------------------------------------------------------------------------------------------------------------------ Chemistries  Recent Labs  Lab 01/20/19 1045 01/21/19 0710  NA 136 136  K 3.3* 3.5  CL 102 105  CO2 19* 24  GLUCOSE 198* 89  BUN <5* <5*  CREATININE 0.33* <0.30*  CALCIUM 7.7* 7.1*  MG  --  1.7  AST 291*  --   ALT 44  --   ALKPHOS 220*  --   BILITOT 5.9*  --    ------------------------------------------------------------------------------------------------------------------  Cardiac Enzymes No results for input(s): TROPONINI in the last 168 hours. ------------------------------------------------------------------------------------------------------------------  RADIOLOGY:  No results found.   ASSESSMENT AND PLAN:   43 y.o. female with past medical history of chronic hepatitis C, CMT, hypertension, transaminitis, folate deficiency, EtOH abuse, tobacco abuse, rectal bleed and malnutrition presenting to the ED with EtOH withdrawal.  * Sinus tachycardia Due to alcohol withdrawal On scheduled metoprolol. We will bolus normal saline to see if that helps.  * Alcohol abuse and withdrawal-still having withdrawals. Average Drinks Per Day 1 L Vodka per day No Hx Seizures   On CIWA protocol Daily Thiamine, Folate - SW consult for cessation resources - PT/OT evaluation for mobility Added Librium yesterday.  Will increase dose.  * Chronic thrombocytopenia - likely due to alcohol  usage - Follows  with Dr. Tasia Catchings - No signs of bleeding we will continue to monitor  *Recent diagnosis of right rib fractures.  Added tramadol as needed.  Incentive spirometer.  * Hypokalemia  Replaced  * Alcoholic gastritis -continue Protonix and Carafate  * HTN Start metoprolol  * Polysubstance abuse -Counseling provided -Social work referral  * Intermittent rectal bleed - Following with GI Dr. Vicente Males - OP f/u -We will consult GI if significant drop in hemoglobin  * Elevated Bilirubin due to alcohol - Monitor  * DVT prophylaxis - Hold anti-coagulation due to  thrombocytopenia (PLT <100k)  All the records are reviewed and case discussed with Care Management/Social Worker Management plans discussed with the patient, family and they are in agreement.  CODE STATUS: FULL CODE  TOTAL  TIME TAKING CARE OF THIS PATIENT: 35 minutes.   POSSIBLE D/C IN  DAYS, DEPENDING ON CLINICAL CONDITION.  Rhonda Martin M.D on 01/22/2019 at 11:26 AM  Between 7am to 6pm - Pager - [phone]  After 6pm go to [email or web address] - password [email or web address] [place] [facility]  Office  [phone]  CC: Primary care physician; Volney American, PA-C  Note: This dictation was prepared with Dragon dictation along with smaller phrase technology. Any transcriptional errors that result from this process are unintentional.

## 2019-01-22 NOTE — Plan of Care (Signed)
  Problem: Education: Goal: Knowledge of General Education information will improve Description: Including pain rating scale, medication(s)/side effects and non-pharmacologic comfort measures Outcome: Progressing   Problem: Health Behavior/Discharge Planning: Goal: Ability to manage health-related needs will improve Outcome: Progressing   Problem: Clinical Measurements: Goal: Ability to maintain clinical measurements within normal limits will improve Outcome: Not Progressing Note: Patient's RBC's are only 2.7 today. Will continue to monitor lab values. Wenda Low [facility]

## 2019-01-23 ENCOUNTER — Telehealth: Payer: Self-pay | Admitting: Gastroenterology

## 2019-01-23 DIAGNOSIS — F10232 Alcohol dependence with withdrawal with perceptual disturbance: Principal | ICD-10-CM

## 2019-01-23 DIAGNOSIS — F411 Generalized anxiety disorder: Secondary | ICD-10-CM | POA: Diagnosis present

## 2019-01-23 DIAGNOSIS — F418 Other specified anxiety disorders: Secondary | ICD-10-CM

## 2019-01-23 MED ORDER — PAROXETINE HCL ER 12.5 MG PO TB24
12.5000 mg | ORAL_TABLET | Freq: Every day | ORAL | Status: DC
  Administered 2019-01-23 – 2019-01-27 (×5): 12.5 mg via ORAL
  Filled 2019-01-23 (×6): qty 1

## 2019-01-23 MED ORDER — LORAZEPAM 2 MG/ML IJ SOLN
1.0000 mg | Freq: Four times a day (QID) | INTRAMUSCULAR | Status: DC | PRN
  Administered 2019-01-23 – 2019-01-25 (×5): 1 mg via INTRAVENOUS
  Filled 2019-01-23 (×6): qty 1

## 2019-01-23 MED ORDER — SODIUM CHLORIDE 0.9 % IV BOLUS
1000.0000 mL | Freq: Once | INTRAVENOUS | Status: AC
  Administered 2019-01-23: 1000 mL via INTRAVENOUS

## 2019-01-23 NOTE — Evaluation (Signed)
Physical Therapy Evaluation Patient Details Name: Rhonda Martin MRN: [id number] DOB: Jul 11, 1975 Today's Date: 01/23/2019   History of Present Illness  43 y.o. female with past medical history of chronic hepatitis C, CMT, hypertension, transaminitis, folate deficiency, EtOH abuse, tobacco abuse, rectal bleed, right shoulder pain and acute 3-4th rib fx and old 5-6th rib fx due to fall on 01/12/2019 and malnutrition presenting to the ED with EtOH withdrawal on 01/20/2019  Clinical Impression  Prior to hospital admission, pt was modified independent with cane for ambulation.  Pt lives with her fiance in a one level house and 6 steps to entry with B sides of railings.  Currently pt is supervision with all mobilities assessed today. Vc's provided for hand and foot placement with RW but pt able to stand without holding on RW. No LOB noted during the 3 feet transfer from EOB to sitting on recliner without AD. Pt refused the further PT assessment for ambualtion tolerance due to fatigue and stomach pain. Will further assess the ambulation tolerance when pt's stomach pain and fatigue improved.   Vital monitored by dinamap as BP99/68 HR 96 in supine; BP105/76 HR 86 in sitting, and BP106/72 and HR 82 (Telemetery as 115 HR). No c/o of dizziness or light-headed but states high anxiety with stomach pain as 8/10 constantly. Nurse communicated with stomach pain and vital status. Pt would benefit from skilled PT to address noted impairments and functional limitations (see below for any additional details).  Upon hospital discharge, recommend pt discharge to [facility] due to limited ambulation tolerance to improve strength, muscle endurance for functional mobility. Will also updated the POC once ambulation tolerance can be assessed in later session.      Follow Up Recommendations Home health PT    Equipment Recommendations  None recommended by PT    Recommendations for Other Services       Precautions / Restrictions  Precautions Precautions: Fall Restrictions Weight Bearing Restrictions: No      Mobility  Bed Mobility Overal bed mobility: Modified Independent             General bed mobility comments: increase time and effort  Transfers Overall transfer level: Needs assistance Equipment used: Rolling walker (2 wheeled);None Transfers: Sit to/from Stand Sit to Stand: Supervision         General transfer comment: vc's provided for hand placement with RW.  Ambulation/Gait Ambulation/Gait assistance: Supervision Gait Distance (Feet): 3 Feet Assistive device: Rolling walker (2 wheeled);None   Gait velocity: decrease   General Gait Details: vc's offered for hand and foot placement with RW; no sign of LOB; pt refused using RW once stand up; and able to ambulate 3 feet safely to sit on recliner.  Stairs            Wheelchair Mobility    Modified Rankin (Stroke Patients Only)       Balance Overall balance assessment: Modified Independent                                           Pertinent Vitals/Pain Pain Assessment: 0-10 Pain Score: 8  Pain Location: Stomach areas and pt sates she is under high anxiety Pain Descriptors / Indicators: Aching Pain Intervention(s): Monitored during session;Limited activity within patient's tolerance;Repositioned    Home Living Family/patient expects to be discharged to:: Private residence Living Arrangements: Spouse/significant other Available Help at Discharge: Family Type of  Home: House Home Access: Stairs to enter Entrance Stairs-Rails: Can reach both Entrance Stairs-Number of Steps: 6 Home Layout: One level Home Equipment: Botines - 2 wheels      Prior Function Level of Independence: Independent with assistive device(s)         Comments: Ambulation with cane as baseline     Hand Dominance        Extremity/Trunk Assessment   Upper Extremity Assessment Upper Extremity Assessment:  Overall WFL for tasks assessed(at least 3/3 with L shoudler flexion, BUE elbow flexion/extenion, and moderate hand grip strength. R shoudler 3-/5 due to pain at piror fall.)    Lower Extremity Assessment Lower Extremity Assessment: Overall WFL for tasks assessed(at least 3+/5 with BLE hip flexion, knee extension/flexion, and L dorsiflexion/plantar flexion. R foot 3-/5 with dorsiflexion/plantar flexion secondary to CMT as baseline)       Communication   Communication: No difficulties  Cognition Arousal/Alertness: Awake/alert Behavior During Therapy: WFL for tasks assessed/performed;Anxious;Impulsive Overall Cognitive Status: Within Functional Limits for tasks assessed                                        General Comments      Exercises Total Joint Exercises Ankle Circles/Pumps: AROM;Strengthening;Both;10 reps;Supine Long Arc Quad: AROM;Strengthening;Both;10 reps;Seated   Assessment/Plan    PT Assessment Patient needs continued PT services  PT Problem List Decreased strength;Decreased range of motion;Decreased activity tolerance;Decreased balance;Decreased mobility       PT Treatment Interventions Gait training;Stair training;Functional mobility training;Therapeutic activities;Therapeutic exercise;Balance training;DME instruction    PT Goals (Current goals can be found in the Care Plan section)  Acute Rehab PT Goals Patient Stated Goal: to go home PT Goal Formulation: With patient Time For Goal Achievement: 02/06/19 Potential to Achieve Goals: Good    Frequency Min 2X/week   Barriers to discharge        Co-evaluation               AM-PAC PT "6 Clicks" Mobility  Outcome Measure Help needed turning from your back to your side while in a flat bed without using bedrails?: A Little Help needed moving from lying on your back to sitting on the side of a flat bed without using bedrails?: A Little Help needed moving to and from a bed to a chair  (including a wheelchair)?: A Little Help needed standing up from a chair using your arms (e.g., wheelchair or bedside chair)?: A Little Help needed to walk in hospital room?: A Little Help needed climbing 3-5 steps with a railing? : A Little 6 Click Score: 18    End of Session Equipment Utilized During Treatment: Gait belt Activity Tolerance: Patient limited by fatigue;Patient limited by pain Patient left: in chair;with chair alarm set(pt's feet are not elevated due to pt's request) Nurse Communication: Mobility status;Precautions;Weight bearing status PT Visit Diagnosis: Repeated falls (R29.6);History of falling (Z91.81);Muscle weakness (generalized) (M62.81);Pain;Difficulty in walking, not elsewhere classified (R26.2)    Time: 5809-9833 PT Time Calculation (min) (ACUTE ONLY): 32 min   Charges:                Sherrilyn Rist, SPT 01/23/2019, 12:31 PM
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
## 2019-01-23 NOTE — Progress Notes (Signed)
[facility] [place] NAME: Rhonda Martin    MR#:  [id number]  DATE OF BIRTH:  30-Jan-1976  SUBJECTIVE:  CHIEF COMPLAINT:   Chief Complaint  Patient presents with  . Withdrawal   Sitting in a chair today.  Tearful.  Continues to have right-sided chest pain.  Worked with physical therapy and did well.  REVIEW OF SYSTEMS:    Review of Systems  Constitutional: Positive for malaise/fatigue. Negative for chills and fever.  HENT: Negative for sore throat.   Eyes: Negative for blurred vision, double vision and pain.  Respiratory: Negative for cough, hemoptysis, shortness of breath and wheezing.   Cardiovascular: Negative for chest pain, palpitations, orthopnea and leg swelling.  Gastrointestinal: Negative for abdominal pain, constipation, diarrhea, heartburn, nausea and vomiting.  Genitourinary: Negative for dysuria and hematuria.  Musculoskeletal: Negative for back pain and joint pain.  Skin: Negative for rash.  Neurological: Positive for dizziness and tremors. Negative for sensory change, speech change, focal weakness and headaches.  Endo/Heme/Allergies: Does not bruise/bleed easily.  Psychiatric/Behavioral: Negative for depression. The patient is not nervous/anxious.     DRUG ALLERGIES:  No Known Allergies  VITALS:  Blood pressure 110/65, pulse (!) 112, temperature (!) 97.5 F (36.4 C), resp. rate 19, height 5\' 2"  (1.575 m), weight 53.7 kg, SpO2 97 %.  PHYSICAL EXAMINATION:   Physical Exam  GENERAL:  43 y.o.-year-old patient sitting in a chair EYES: Pupils equal, round, reactive to light and accommodation. No scleral icterus. Extraocular muscles intact.  HEENT: Head atraumatic, normocephalic. Oropharynx and nasopharynx clear.  NECK:  Supple, no jugular venous distention. No thyroid enlargement, no tenderness.  LUNGS: Normal breath sounds bilaterally, no wheezing, rales, rhonchi. No use of accessory muscles of respiration.   CARDIOVASCULAR: S1, S2 , tachycardia ABDOMEN: Soft, nontender, nondistended. Bowel sounds present. No organomegaly or mass.  EXTREMITIES: No cyanosis, clubbing or edema b/l.    NEUROLOGIC: Cranial nerves II through XII are intact. No focal Motor or sensory deficits b/l.   Tremors PSYCHIATRIC: The patient is alert and oriented x 3.  Anxious SKIN: No obvious rash, lesion, or ulcer.   LABORATORY PANEL:   CBC Recent Labs  Lab 01/20/19 1045  WBC 5.4  HGB 9.8*  HCT 30.4*  PLT 67*   ------------------------------------------------------------------------------------------------------------------ Chemistries  Recent Labs  Lab 01/20/19 1045 01/21/19 0710  NA 136 136  K 3.3* 3.5  CL 102 105  CO2 19* 24  GLUCOSE 198* 89  BUN <5* <5*  CREATININE 0.33* <0.30*  CALCIUM 7.7* 7.1*  MG  --  1.7  AST 291*  --   ALT 44  --   ALKPHOS 220*  --   BILITOT 5.9*  --    ------------------------------------------------------------------------------------------------------------------  Cardiac Enzymes No results for input(s): TROPONINI in the last 168 hours. ------------------------------------------------------------------------------------------------------------------  RADIOLOGY:  No results found.   ASSESSMENT AND PLAN:   43 y.o. female with past medical history of chronic hepatitis C, CMT, hypertension, transaminitis, folate deficiency, EtOH abuse, tobacco abuse, rectal bleed and malnutrition presenting to the ED with EtOH withdrawal.  * Sinus tachycardia Due to alcohol withdrawal On scheduled metoprolol.  * Alcohol abuse and withdrawal-still having withdrawals. Average Drinks Per Day 1 L Vodka per day No Hx Seizures   On CIWA protocol Daily Thiamine, Folate - SW consult for cessation resources - PT/OT evaluation for mobility On Librium 25 mg 4 times daily.  *Anxiety/depression Consult psychiatry for further input.  Message sent in epic.  * Chronic  thrombocytopenia -  likely due to alcohol usage - Follows with Dr. Tasia Catchings - No signs of bleeding we will continue to monitor  *Recent diagnosis of right rib fractures.  Added tramadol as needed.  Incentive spirometer.  * Hypokalemia  Replaced  * Alcoholic gastritis -continue Protonix and Carafate  * HTN Start metoprolol  * Polysubstance abuse -Counseling provided -Social work referral  * Intermittent rectal bleed - Following with GI Dr. Vicente Males - OP f/u -We will consult GI if significant drop in hemoglobin  * Elevated Bilirubin due to alcohol - Monitor  * DVT prophylaxis - Hold anti-coagulation due to  thrombocytopenia (PLT <100k)  All the records are reviewed and case discussed with Care Management/Social Worker Management plans discussed with the patient, family and they are in agreement.  CODE STATUS: FULL CODE  TOTAL  TIME TAKING CARE OF THIS PATIENT: 35 minutes.   POSSIBLE D/C IN  1-2 DAYS, DEPENDING ON CLINICAL CONDITION.  Leia Alf Rhonda Martin M.D on 01/23/2019 at 1:15 PM  Between 7am to 6pm - Pager - [phone]  After 6pm go to [email or web address] - password [email or web address] [facility]  Office  [phone]  CC: Primary care physician; Volney American, PA-C  Note: This dictation was prepared with Dragon dictation along with smaller phrase technology. Any transcriptional errors that result from this process are unintentional.

## 2019-01-23 NOTE — Progress Notes (Signed)
Rhonda Martin refuses to have the bed alarm on when she is in bed.  She turn the bed alarm off when it alarms.  Although she is unsteady on her feet - she will not let us know when she needs to get up.

## 2019-01-23 NOTE — Telephone Encounter (Signed)
[facility]  U/S  Dep. Left vm stating pt is scheduled for U/S tomorrow but is currently admitted

## 2019-01-24 ENCOUNTER — Ambulatory Visit: Payer: Medicaid Other

## 2019-01-24 DIAGNOSIS — F10239 Alcohol dependence with withdrawal, unspecified: Secondary | ICD-10-CM

## 2019-01-24 LAB — BASIC METABOLIC PANEL
Anion gap: 4 — ABNORMAL LOW (ref 5–15)
BUN: 7 mg/dL (ref 6–20)
CO2: 22 mmol/L (ref 22–32)
Calcium: 7.2 mg/dL — ABNORMAL LOW (ref 8.9–10.3)
Chloride: 108 mmol/L (ref 98–111)
Creatinine, Ser: <0.3 mg/dL — ABNORMAL LOW (ref 0.44–1.00)
Glucose, Bld: 91 mg/dL (ref 70–99)
Potassium: 3.7 mmol/L (ref 3.5–5.1)
Sodium: 134 mmol/L — ABNORMAL LOW (ref 135–145)

## 2019-01-24 LAB — HEPATITIS A ANTIBODY, TOTAL: hep A Total Ab: POSITIVE — AB

## 2019-01-24 LAB — IRON,TIBC AND FERRITIN PANEL
Ferritin: 197 ng/mL — ABNORMAL HIGH (ref 15–150)
Iron Saturation: 16 % (ref 15–55)
Iron: 30 ug/dL (ref 27–159)
Total Iron Binding Capacity: 192 ug/dL — ABNORMAL LOW (ref 250–450)
UIBC: 162 ug/dL (ref 131–425)

## 2019-01-24 LAB — HEPATITIS B SURFACE ANTIBODY,QUALITATIVE: Hep B Surface Ab, Qual: REACTIVE

## 2019-01-24 LAB — HEPATITIS B DNA, ULTRAQUANTITATIVE, PCR: HBV DNA SERPL PCR-ACNC: <10 IU/mL

## 2019-01-24 LAB — HEPATITIS C GENOTYPE

## 2019-01-24 LAB — IMMUNOGLOBULINS A/E/G/M, SERUM
IgA/Immunoglobulin A, Serum: 754 mg/dL — ABNORMAL HIGH (ref 87–352)
IgE (Immunoglobulin E), Serum: 78 IU/mL (ref 6–495)
IgG (Immunoglobin G), Serum: 1888 mg/dL — ABNORMAL HIGH (ref 586–1602)
IgM (Immunoglobulin M), Srm: 636 mg/dL — ABNORMAL HIGH (ref 26–217)

## 2019-01-24 LAB — MITOCHONDRIAL/SMOOTH MUSCLE AB PNL
Mitochondrial Ab: <20 Units (ref 0.0–20.0)
Smooth Muscle Ab: 7 Units (ref 0–19)

## 2019-01-24 LAB — HIV ANTIBODY (ROUTINE TESTING W REFLEX): HIV Screen 4th Generation wRfx: NONREACTIVE

## 2019-01-24 LAB — CELIAC DISEASE AB SCREEN W/RFX
Antigliadin Abs, IgA: 12 units (ref 0–19)
Transglutaminase IgA: <2 U/mL (ref 0–3)

## 2019-01-24 LAB — ALPHA-1-ANTITRYPSIN: A-1 Antitrypsin: 270 mg/dL — ABNORMAL HIGH (ref 101–187)

## 2019-01-24 LAB — HEPATITIS B SURFACE ANTIGEN: Hepatitis B Surface Ag: NEGATIVE

## 2019-01-24 LAB — MAGNESIUM: Magnesium: 1.8 mg/dL (ref 1.7–2.4)

## 2019-01-24 LAB — CK: Total CK: 55 U/L (ref 32–182)

## 2019-01-24 LAB — ANTI-MICROSOMAL ANTIBODY LIVER / KIDNEY: LKM1 Ab: 1.4 Units (ref 0.0–20.0)

## 2019-01-24 LAB — HEPATITIS B E ANTIBODY: Hep B E Ab: POSITIVE — AB

## 2019-01-24 LAB — HEPATITIS B E ANTIGEN: Hep B E Ag: NEGATIVE

## 2019-01-24 LAB — ANA: Anti Nuclear Antibody (ANA): NEGATIVE

## 2019-01-24 LAB — CERULOPLASMIN: Ceruloplasmin: 30.8 mg/dL (ref 19.0–39.0)

## 2019-01-24 LAB — HEPATITIS B CORE ANTIBODY, TOTAL: Hep B Core Total Ab: POSITIVE — AB

## 2019-01-24 MED ORDER — IPRATROPIUM-ALBUTEROL 0.5-2.5 (3) MG/3ML IN SOLN
RESPIRATORY_TRACT | Status: AC
  Filled 2019-01-24: qty 6

## 2019-01-24 NOTE — Consult Note (Signed)
Patient is seen and examined.  Patient is a 43 year old female with a past psychiatric history significant for alcohol dependence and a past medical history significant for chronic hepatitis C, hypertension, transaminitis, folate deficiency, tobacco abuse, GI bleed, right shoulder pain, and acute rib fracture who originally presented to the [facility] emergency department on 8/14.  Patient was originally seen in psychiatric consultation on 01/23/2019.  The patient had been admitted with alcohol detox with complications.  She admitted that she had been drinking 1/5 of vodka a day for the last 2 to 3 years.  She was unable to recall any amount of time of sobriety.  She had already previously been given Ativan detox protocol, and there was a recommendation for rehabilitation stay after detox.  The patient had also stated that she had had Paxil as an adolescent, and that it is been effective for her.  She also has been on multiple antidepressant and antianxiety medicines in the past without success.  Patient is seen and examined.  Patient stated she does not feel well today.  She stated she has a severe headache.  She denied gross alcohol withdrawal symptoms.  She stated that she had a history of severe anxiety that was "paralyzing".  She denied any suicidal or homicidal ideation.   Laboratories: New laboratories today revealed a mildly low sodium at 134, creatinine at less than 0.30.  Calcium was slightly low at 7.2.  Her lipase was slightly elevated at 42.  From 8/14 her AST was 291 and her ALT 44.  It should be noted on 8/14 her MCV was 111.4.  Platelets were low at 67,000, also consistent with long-term alcohol dependence.  Her hepatitis panel from 8/13 revealed positive hepatitis A, hepatitis B E antigen positive hepatitis B surface antibody reactive.  Vital signs: Temperature is 98.9, pulse is 95, blood pressure is 106/52. She is alert and oriented x3.  Polite, pleasant and  appropriately dressed.  Poor eye contact.  Mild psychomotor agitation.  She complained of a headache as well as significant anxiety.  She admitted to helplessness, hopelessness and worthlessness.  No evidence of psychosis.  She denied suicidal ideation or homicidal ideation.  Problem list and plan: 1.  Alcohol dependence and alcohol withdrawal, 2.  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  Substance-induced mood disorder. Patient is seen and examined.  Patient is a 43 year old female with the above-stated past psychiatric history who is seen in follow-up consultation.  Her vital signs are stable.  She continues on lorazepam 1 mg IV every 6 hours as needed anxiety, folic acid 1 mg p.o. daily, Paxil CR 12.5 mg p.o. daily, thiamine 100 mg p.o. daily.  I would recommend changing her lorazepam to p.o. versus IV.  I would change the parameters of the lorazepam to a CIWA greater than 10.  She has basically been on the Paxil only 1 to 2 days, and I would recommend no change in that currently.  No other changes in the recommendations at this point.

## 2019-01-24 NOTE — Progress Notes (Signed)
PT Cancellation Note  Patient Details Name: Rhonda Martin MRN: [id number] DOB: 1976-02-21   Cancelled Treatment:    Reason Eval/Treat Not Completed: Patient declined, no reason specified PT attempted this AM at 0858 and pt sitting on bed. Pt refused PT session and wanted to continue her breakfast. Pt states would prefer PT session in the PM due to her current fatigue. Will re-attempt PT at later time/date when able.    Sherrilyn Rist 01/24/2019, 9:02 AM

## 2019-01-24 NOTE — Progress Notes (Signed)
[place] at [place] NAME: Rhonda Martin    MR#:  [id number]  DATE OF BIRTH:  11-Nov-1975  SUBJECTIVE:  CHIEF COMPLAINT:   Chief Complaint  Patient presents with  . Withdrawal   Complains of stomach pain, asking for Ativan.  REVIEW OF SYSTEMS:    Review of Systems  Constitutional: Positive for malaise/fatigue. Negative for chills and fever.  HENT: Negative for sore throat.   Eyes: Negative for blurred vision, double vision and pain.  Respiratory: Negative for cough, hemoptysis, shortness of breath and wheezing.   Cardiovascular: Negative for chest pain, palpitations, orthopnea and leg swelling.  Gastrointestinal: Negative for abdominal pain, constipation, diarrhea, heartburn, nausea and vomiting.  Genitourinary: Negative for dysuria and hematuria.  Musculoskeletal: Negative for back pain and joint pain.  Skin: Negative for rash.  Neurological: Positive for tremors. Negative for dizziness, sensory change, speech change, focal weakness and headaches.  Endo/Heme/Allergies: Does not bruise/bleed easily.  Psychiatric/Behavioral: Negative for depression. The patient is not nervous/anxious.     DRUG ALLERGIES:  No Known Allergies  VITALS:  Blood pressure 107/61, pulse 92, temperature 98.9 F (37.2 C), temperature source Oral, resp. rate 16, height 5\' 2"  (1.575 m), weight 57.2 kg, SpO2 98 %.  PHYSICAL EXAMINATION:   Physical Exam  GENERAL:  43 y.o.-year-old patient sitting in a chair EYES: Pupils equal, round, reactive to light  No scleral icterus. Extraocular muscles intact.  HEENT: Head atraumatic, normocephalic. Oropharynx and nasopharynx clear.  NECK:  Supple, no jugular venous distention. No thyroid enlargement, no tenderness.  LUNGS: Normal breath sounds bilaterally, no wheezing, rales, rhonchi. No use of accessory muscles of respiration.  CARDIOVASCULAR: S1, S2 , tachycardia ABDOMEN: Soft, nontender, nondistended. Bowel sounds  present. No organomegaly or mass.  EXTREMITIES: No cyanosis, clubbing or edema b/l.    NEUROLOGIC: Cranial nerves II through XII are intact. No focal Motor or sensory deficits b/l.   Tremors PSYCHIATRIC: The patient is alert and oriented x 3.  Anxious SKIN: No obvious rash, lesion, or ulcer.   LABORATORY PANEL:   CBC Recent Labs  Lab 01/20/19 1045  WBC 5.4  HGB 9.8*  HCT 30.4*  PLT 67*   ------------------------------------------------------------------------------------------------------------------ Chemistries  Recent Labs  Lab 01/20/19 1045  01/24/19 0420  NA 136   < > 134*  K 3.3*   < > 3.7  CL 102   < > 108  CO2 19*   < > 22  GLUCOSE 198*   < > 91  BUN <5*   < > 7  CREATININE 0.33*   < > <0.30*  CALCIUM 7.7*   < > 7.2*  MG  --    < > 1.8  AST 291*  --   --   ALT 44  --   --   ALKPHOS 220*  --   --   BILITOT 5.9*  --   --    < > = values in this interval not displayed.   ------------------------------------------------------------------------------------------------------------------  Cardiac Enzymes No results for input(s): TROPONINI in the last 168 hours. ------------------------------------------------------------------------------------------------------------------  RADIOLOGY:  No results found.   ASSESSMENT AND PLAN:   43 y.o. female with past medical history of chronic hepatitis C, CMT, hypertension, transaminitis, folate deficiency, EtOH abuse, tobacco abuse, rectal bleed and malnutrition presenting to the ED with EtOH withdrawal.  * Sinus tachycardia Due to alcohol withdrawal On scheduled metoprolol.  Tachycardia resolved.  * Alcohol abuse and withdrawal-still having withdrawals. Average Drinks Per Day 1 L  Vodka per day No Hx Seizures   On CIWA protocol Daily Thiamine, Folate, psychiatriy following, patient on IV Ativan every 6 hours 1 mg as needed for anxiety - SW consult for cessation resources -    *Anxiety/depression Continue Paxil*   Psychiatry following, continue Paxil   - Chronic thrombocytopenia, follows with Dr. Tasia Catchings - No signs of bleeding we will continue to monitor  *Recent diagnosis of right rib fractures.  Added tramadol as needed.  Incentive spirometer.  * Hypokalemia  Replaced, potassium improved.  * Alcoholic gastritis -continue Protonix and Carafate  * HTN Start edmetoprolol  * Polysubstance abuse -Counseling provided -Social work referral  * Intermittent rectal bleed - Following with GI Dr. Vicente Males - OP f/u -We will consult GI if significant drop in hemoglobin  * Elevated Bilirubin due to alcohol - Monitor Alcohol dependence, alcohol withdrawal, anxiety, substance-induced mood disorder, psychiatry is following.   * DVT prophylaxis - Hold anti-coagulation due to  thrombocytopenia (PLT <100k)  All the records are reviewed and case discussed with Care Management/Social Worker Management plans discussed with the patient, family and they are in agreement.  CODE STATUS: FULL CODE  TOTAL  TIME TAKING CARE OF THIS PATIENT: 35 minutes.  More than 50% time spent in counseling, coordination of care POSSIBLE D/C IN  1-2 DAYS, DEPENDING ON CLINICAL CONDITION.  Epifanio Lesches M.D on 01/24/2019 at 3:22 PM  Between 7am to 6pm - Pager - [phone]  After 6pm go to [email or web address] - password [email or web address] [facility]  Office  [phone]  CC: Primary care physician; Volney American, PA-C  Note: This dictation was prepared with Dragon dictation along with smaller phrase technology. Any transcriptional errors that result from this process are unintentional.

## 2019-01-25 LAB — COMPREHENSIVE METABOLIC PANEL
ALT: 23 U/L (ref 0–44)
AST: 83 U/L — ABNORMAL HIGH (ref 15–41)
Albumin: 1.9 g/dL — ABNORMAL LOW (ref 3.5–5.0)
Alkaline Phosphatase: 134 U/L — ABNORMAL HIGH (ref 38–126)
Anion gap: 6 (ref 5–15)
BUN: <5 mg/dL — ABNORMAL LOW (ref 6–20)
CO2: 24 mmol/L (ref 22–32)
Calcium: 7.5 mg/dL — ABNORMAL LOW (ref 8.9–10.3)
Chloride: 105 mmol/L (ref 98–111)
Creatinine, Ser: <0.3 mg/dL — ABNORMAL LOW (ref 0.44–1.00)
Glucose, Bld: 85 mg/dL (ref 70–99)
Potassium: 3.6 mmol/L (ref 3.5–5.1)
Sodium: 135 mmol/L (ref 135–145)
Total Bilirubin: 4.4 mg/dL — ABNORMAL HIGH (ref 0.3–1.2)
Total Protein: 5.9 g/dL — ABNORMAL LOW (ref 6.5–8.1)

## 2019-01-25 LAB — CBC
HCT: 26.6 % — ABNORMAL LOW (ref 36.0–46.0)
Hemoglobin: 8.5 g/dL — ABNORMAL LOW (ref 12.0–15.0)
MCH: 35.7 pg — ABNORMAL HIGH (ref 26.0–34.0)
MCHC: 32 g/dL (ref 30.0–36.0)
MCV: 111.8 fL — ABNORMAL HIGH (ref 80.0–100.0)
Platelets: 117 10*3/uL — ABNORMAL LOW (ref 150–400)
RBC: 2.38 MIL/uL — ABNORMAL LOW (ref 3.87–5.11)
RDW: 13.6 % (ref 11.5–15.5)
WBC: 4 10*3/uL (ref 4.0–10.5)
nRBC: 0 % (ref 0.0–0.2)

## 2019-01-25 LAB — MAGNESIUM: Magnesium: 1.8 mg/dL (ref 1.7–2.4)

## 2019-01-25 MED ORDER — LORAZEPAM 1 MG PO TABS
1.0000 mg | ORAL_TABLET | Freq: Four times a day (QID) | ORAL | Status: DC | PRN
  Administered 2019-01-25 – 2019-01-27 (×5): 1 mg via ORAL
  Filled 2019-01-25 (×5): qty 1

## 2019-01-25 MED ORDER — ENOXAPARIN SODIUM 40 MG/0.4ML ~~LOC~~ SOLN
40.0000 mg | SUBCUTANEOUS | Status: DC
  Administered 2019-01-25 – 2019-01-26 (×2): 40 mg via SUBCUTANEOUS
  Filled 2019-01-25 (×2): qty 0.4

## 2019-01-25 MED ORDER — MAGNESIUM SULFATE 2 GM/50ML IV SOLN
2.0000 g | Freq: Once | INTRAVENOUS | Status: AC
  Administered 2019-01-25: 2 g via INTRAVENOUS
  Filled 2019-01-25: qty 50

## 2019-01-25 NOTE — Progress Notes (Signed)
[place] at [place] NAME: Rhonda Martin    MR#:  [id number]  DATE OF BIRTH:  09-01-1975  SUBJECTIVE:  CHIEF COMPLAINT:   Chief Complaint  Patient presents with  . Withdrawal   Patient still has tremors, denies any hallucinations.  REVIEW OF SYSTEMS:    Review of Systems  Constitutional: Positive for malaise/fatigue. Negative for chills and fever.  HENT: Negative for sore throat.   Eyes: Negative for blurred vision, double vision and pain.  Respiratory: Negative for cough, hemoptysis, shortness of breath and wheezing.   Cardiovascular: Negative for chest pain, palpitations, orthopnea and leg swelling.  Gastrointestinal: Negative for abdominal pain, constipation, diarrhea, heartburn, nausea and vomiting.  Genitourinary: Negative for dysuria and hematuria.  Musculoskeletal: Negative for back pain and joint pain.  Skin: Negative for rash.  Neurological: Positive for tremors. Negative for dizziness, sensory change, speech change, focal weakness and headaches.  Endo/Heme/Allergies: Does not bruise/bleed easily.  Psychiatric/Behavioral: Negative for depression. The patient is not nervous/anxious.     DRUG ALLERGIES:  No Known Allergies  VITALS:  Blood pressure 97/61, pulse 80, temperature 97.9 F (36.6 C), temperature source Oral, resp. rate 18, height 5\' 2"  (1.575 m), weight 55.7 kg, SpO2 100 %.  PHYSICAL EXAMINATION:   Physical Exam  GENERAL:  43 y.o.-year-old patient sitting in a chair EYES: Pupils equal, round, reactive to light  No scleral icterus. Extraocular muscles intact.  HEENT: Head atraumatic, normocephalic. Oropharynx and nasopharynx clear.  NECK:  Supple, no jugular venous distention. No thyroid enlargement, no tenderness.  LUNGS: Normal breath sounds bilaterally, no wheezing, rales, rhonchi. No use of accessory muscles of respiration.  CARDIOVASCULAR: S1, S2 , tachycardia ABDOMEN: Soft, nontender, nondistended. Bowel  sounds present. No organomegaly or mass.  EXTREMITIES: No cyanosis, clubbing or edema b/l.    NEUROLOGIC: Cranial nerves II through XII are intact. No focal Motor or sensory deficits b/l.   Tremors PSYCHIATRIC: The patient is alert and oriented x 3.  Anxious SKIN: No obvious rash, lesion, or ulcer.   LABORATORY PANEL:   CBC Recent Labs  Lab 01/25/19 0835  WBC 4.0  HGB 8.5*  HCT 26.6*  PLT 117*   ------------------------------------------------------------------------------------------------------------------ Chemistries  Recent Labs  Lab 01/25/19 0835  NA 135  K 3.6  CL 105  CO2 24  GLUCOSE 85  BUN <5*  CREATININE <0.30*  CALCIUM 7.5*  MG 1.8  AST 83*  ALT 23  ALKPHOS 134*  BILITOT 4.4*   ------------------------------------------------------------------------------------------------------------------  Cardiac Enzymes No results for input(s): TROPONINI in the last 168 hours. ------------------------------------------------------------------------------------------------------------------  RADIOLOGY:  No results found.   ASSESSMENT AND PLAN:   43 y.o. female with past medical history of chronic hepatitis C, CMT, hypertension, transaminitis, folate deficiency, EtOH abuse, tobacco abuse, rectal bleed and malnutrition presenting to the ED with EtOH withdrawal.  * Sinus tachycardia Due to alcohol withdrawal On scheduled metoprolol.  Tachycardia resolved.  * Alcohol abuse and withdrawal-still having withdrawals. Average Drinks Per Day 1 L Vodka per day No Hx Seizures   On CIWA protocol, changed to p.o. Ativan today. Daily Thiamine, Folate, psychiatriy following, - SW consult for cessation resources -    *Anxiety/depression Continue Paxil*  Psychiatry following, continue Paxil   - Chronic thrombocytopenia, follows with Dr. Tasia Catchings - No signs of bleeding we will continue to monitor, platelets stable around 117 today.  *Recent diagnosis of right rib fractures.   Added tramadol as needed.  Incentive spirometer.  * Hypokalemia  Replaced, potassium  improved. Hypomagnesemia, corrected magnesium. * Alcoholic gastritis -continue Protonix and Carafate  * HTN Start edmetoprolol  * Polysubstance abuse -Counseling provided -Social work referral  * Intermittent rectal bleed - Following with GI Dr. Vicente Males - OP f/u -We will consult GI if significant drop in hemoglobin  * Elevated Bilirubin due to alcohol - Monitor, Alcohol dependence, alcohol withdrawal, anxiety, substance-induced mood disorder, psychiatry is following.   * DVT prophylaxis -As platelet count improved start Lovenox.  All the records are reviewed and case discussed with Care Management/Social Worker Management plans discussed with the patient, family and they are in agreement.  CODE STATUS: FULL CODE  TOTAL  TIME TAKING CARE OF THIS PATIENT: 35 minutes.  More than 50% time spent in counseling, coordination of care POSSIBLE D/C IN  1-2 DAYS, DEPENDING ON CLINICAL CONDITION.  Epifanio Lesches M.D on 01/25/2019 at 12:43 PM  Between 7am to 6pm - Pager - [phone]  After 6pm go to [email or web address] - password [email or web address] [facility]  Office  [phone]  CC: Primary care physician; Volney American, PA-C  Note: This dictation was prepared with Dragon dictation along with smaller phrase technology. Any transcriptional errors that result from this process are unintentional.

## 2019-01-25 NOTE — Progress Notes (Signed)
PT Cancellation Note  Patient Details Name: Rhonda Martin MRN: [id number] DOB: [birth year]   Cancelled Treatment:    Reason Eval/Treat Not Completed: Patient declined, no reason specified.  Upon PT arrival to pt's room, pt resting in bed.  Pt refusing PT d/t reporting recently getting very painful "shots" to her R arm (9/10 pain) and did not want to get OOB (nurse notified of pt's pain complaints).  Therapist encouraged pt to participate in LE ex's in bed but pt also refusing ex's and just wanted to rest.  Will re-attempt PT treatment at a late date/time.  Leitha Bleak, PT 01/25/19, 2:28 PM [phone]

## 2019-01-25 NOTE — Consult Note (Signed)
[facility] Consult   Reason for Consult:  Alcohol abuse Referring Physician:  Hospitalist Patient Identification: Rhonda [place] MRN:  [id number] Principal Diagnosis: <principal problem not specified> Diagnosis:  Active Problems:   Alcohol withdrawal syndrome ([place])   Generalized anxiety disorder   Anxious depression   Total Time spent with patient: 15 minutes  Subjective:   Rhonda [place] is a 43 y.o. female patient reports that she is doing okay today except for her severe pain.  She denies any suicidal or homicidal ideations and denies any hallucinations.  Patient denies any medication side effects.  Patient states that she would like the nurse to come and see her to assist her with some medication for pain at this time.Marland Kitchen  HPI:  43 year old female with a past psychiatric history significant for alcohol dependence and a past medical history significant for chronic hepatitis C, hypertension, transaminitis, folate deficiency, tobacco abuse, GI bleed, right shoulder pain, and acute rib fracture who originally presented to the [facility] emergency department on 8/14.  Patient is seen by this provider face-to-face.  Patient seems restless in bed today and is complaining of severe pain and is requesting for the nurse to come in and see her.  She does deny any suicidal or homicidal ideations and denies any hallucinations today.  She states that she is not having any severe withdrawal symptoms either.  Patient continues to be focused on her pain.  She states that she does have some depression but states that the medications she thinks will help her and denied any side effects from the Paxil today.  Patient does not provide extensive information at this time and states that is due to her pain that she is having.  I did notify the secretary on the unit about the patient's request for the nurse to come and see her.  We will continue to follow up with patient for  progression at this time the patient does not meet inpatient criteria.  Past Psychiatric History: alcohol dependence and anxiety  Risk to Self:   Risk to Others:   Prior Inpatient Therapy:   Prior Outpatient Therapy:    Past Medical History:  Past Medical History:  Diagnosis Date  . Back injury   . Cervical cancer ([place])   . Charcot-Marie-Tooth disease   . Hypertension   . Leg injury     Past Surgical History:  Procedure Laterality Date  . BACK SURGERY    . LEG SURGERY     Family History:  Family History  Problem Relation Age of Onset  . Diabetes Mother   . Hypertension Mother   . Cancer Father        unknown what kind of cancer   . Hypertension Sister   . Hypertension Brother   . Heart attack Brother    Family Psychiatric  History: None reported Social History:  Social History   Substance and Sexual Activity  Alcohol Use Yes  . Alcohol/week: 5.0 standard drinks  . Types: 5 Shots of liquor per week   Comment: 1 pint per day drinker. Last drink around 0300     Social History   Substance and Sexual Activity  Drug Use Not Currently    Social History   Socioeconomic History  . Marital status: Divorced    Spouse name: Not on file  . Number of children: Not on file  . Years of education: Not on file  . Highest education level: Not on file  Occupational History  .  Not on file  Social Needs  . Financial resource strain: Not on file  . Food insecurity    Worry: Not on file    Inability: Not on file  . Transportation needs    Medical: Not on file    Non-medical: Not on file  Tobacco Use  . Smoking status: Current Every Day Smoker    Packs/day: 0.25    Years: 30.00    Pack years: 7.50    Types: Cigarettes  . Smokeless tobacco: Never Used  Substance and Sexual Activity  . Alcohol use: Yes    Alcohol/week: 5.0 standard drinks    Types: 5 Shots of liquor per week    Comment: 1 pint per day drinker. Last drink around 0300  . Drug use: Not Currently  .  Sexual activity: Not Currently    Birth control/protection: I.U.D.  Lifestyle  . Physical activity    Days per week: Not on file    Minutes per session: Not on file  . Stress: Not on file  Relationships  . Social Herbalist on phone: Not on file    Gets together: Not on file    Attends religious service: Not on file    Active member of club or organization: Not on file    Attends meetings of clubs or organizations: Not on file    Relationship status: Not on file  Other Topics Concern  . Not on file  Social History Narrative  . Not on file   Additional Social History:    Allergies:  No Known Allergies  Labs:  Results for orders placed or performed during the hospital encounter of 01/20/19 (from the past 48 hour(s))  Basic metabolic panel     Status: Abnormal   Collection Time: 01/24/19  4:20 AM  Result Value Ref Range   Sodium 134 (L) 135 - 145 mmol/L   Potassium 3.7 3.5 - 5.1 mmol/L   Chloride 108 98 - 111 mmol/L   CO2 22 22 - 32 mmol/L   Glucose, Bld 91 70 - 99 mg/dL   BUN 7 6 - 20 mg/dL   Creatinine, Ser <0.30 (L) 0.44 - 1.00 mg/dL   Calcium 7.2 (L) 8.9 - 10.3 mg/dL   GFR calc non Af Amer NOT CALCULATED >60 mL/min   GFR calc Af Amer NOT CALCULATED >60 mL/min   Anion gap 4 (L) 5 - 15    Comment: Performed at [facility], [address] [place], [place] [address]  Magnesium     Status: None   Collection Time: 01/24/19  4:20 AM  Result Value Ref Range   Magnesium 1.8 1.7 - 2.4 mg/dL    Comment: Performed at [facility], [address] [place], [place] [address]  CBC     Status: Abnormal   Collection Time: 01/25/19  8:35 AM  Result Value Ref Range   WBC 4.0 4.0 - 10.5 K/uL   RBC 2.38 (L) 3.87 - 5.11 MIL/uL   Hemoglobin 8.5 (L) 12.0 - 15.0 g/dL   HCT 26.6 (L) 36.0 - 46.0 %   MCV 111.8 (H) 80.0 - 100.0 fL   MCH 35.7 (H) 26.0 - 34.0 pg   MCHC 32.0 30.0 - 36.0 g/dL   RDW 13.6 11.5 - 15.5 %   Platelets 117 (L) 150 - 400 K/uL   nRBC  0.0 0.0 - 0.2 %    Comment: Performed at [facility], [address], [place], [place] [address]  Comprehensive metabolic panel     Status: Abnormal   Collection Time: 01/25/19  8:35 AM  Result Value Ref Range   Sodium 135 135 - 145 mmol/L   Potassium 3.6 3.5 - 5.1 mmol/L   Chloride 105 98 - 111 mmol/L   CO2 24 22 - 32 mmol/L   Glucose, Bld 85 70 - 99 mg/dL   BUN <5 (L) 6 - 20 mg/dL   Creatinine, Ser <0.30 (L) 0.44 - 1.00 mg/dL   Calcium 7.5 (L) 8.9 - 10.3 mg/dL   Total Protein 5.9 (L) 6.5 - 8.1 g/dL   Albumin 1.9 (L) 3.5 - 5.0 g/dL   AST 83 (H) 15 - 41 U/L   ALT 23 0 - 44 U/L   Alkaline Phosphatase 134 (H) 38 - 126 U/L   Total Bilirubin 4.4 (H) 0.3 - 1.2 mg/dL   GFR calc non Af Amer NOT CALCULATED >60 mL/min   GFR calc Af Amer NOT CALCULATED >60 mL/min   Anion gap 6 5 - 15    Comment: Performed at [facility], [address] [place], [place] [address]  Magnesium     Status: None   Collection Time: 01/25/19  8:35 AM  Result Value Ref Range   Magnesium 1.8 1.7 - 2.4 mg/dL    Comment: Performed at [facility], [address] [place],  [address]    Current Facility-Administered Medications  Medication Dose Route Frequency Provider Last Rate Last Dose  . acetaminophen (TYLENOL) tablet 650 mg  650 mg Oral Q6H PRN Hillary Bow, MD   650 mg at 01/22/19 2002  . chlordiazePOXIDE (LIBRIUM) capsule 25 mg  25 mg Oral QID Hillary Bow, MD   25 mg at 01/25/19 0836  . diphenoxylate-atropine (LOMOTIL) 2.5-0.025 MG per tablet 1 tablet  1 tablet Oral TID PRN Hillary Bow, MD   1 tablet at 01/21/19 1851  . enoxaparin (LOVENOX) injection 40 mg  40 mg Subcutaneous Q24H Epifanio Lesches, MD      . folic acid (FOLVITE) tablet 1 mg  1 mg Oral Daily Lang Snow, NP   1 mg at 01/25/19 0835  . LORazepam (ATIVAN) tablet 1 mg  1 mg Oral Q6H PRN Epifanio Lesches, MD      . metoprolol tartrate (LOPRESSOR) tablet 25 mg  25 mg Oral BID Hillary Bow, MD   25 mg at 01/25/19 0835  . multivitamin with minerals tablet 1 tablet  1 tablet Oral Daily Delman Kitten, MD   1 tablet at 01/25/19 0836  . nicotine (NICODERM CQ - dosed in mg/24 hours) patch 14 mg  14 mg Transdermal Daily Hillary Bow, MD   14 mg at 01/22/19 0911  . pantoprazole (PROTONIX) EC tablet 40 mg  40 mg Oral Daily Lang Snow, NP   40 mg at 01/25/19 0835  . PARoxetine (PAXIL-CR) 24 hr tablet 12.5 mg  12.5 mg Oral Daily Patrecia Pour, NP   12.5 mg at 01/25/19 0835  . potassium chloride SA (K-DUR) CR tablet 20 mEq  20 mEq Oral BID Lang Snow, NP   20 mEq at 01/25/19 0835  . sodium chloride flush (NS) 0.9 % injection 3 mL  3 mL Intravenous Q12H Lang Snow, NP   3 mL at 01/25/19 0837  . sodium chloride flush (NS) 0.9 % injection 3 mL  3 mL Intravenous PRN Lang Snow, NP      . sucralfate (CARAFATE) tablet 1 g  1 g Oral TID  WC & HS Lang Snow, NP   1 g at 01/25/19 1117  . thiamine (VITAMIN B-1) tablet 100 mg  100 mg Oral Daily Delman Kitten, MD   100 mg at 01/25/19 4680   Or  . thiamine (B-1) injection 100 mg  100 mg Intravenous Daily Delman Kitten, MD   100 mg at 01/20/19 1115  . traMADol (ULTRAM) tablet 50 mg  50 mg Oral Q6H PRN Hillary Bow, MD        Musculoskeletal: Strength & Muscle Tone: within normal limits Gait & Station: PAtient remained in bed during evaluation Patient leans: N/A  Psychiatric Specialty Exam: Physical Exam  Nursing note and vitals reviewed. Constitutional: She is oriented to person, place, and time. She appears well-developed and well-nourished.  Cardiovascular: Normal rate.  Respiratory: Effort normal.  Musculoskeletal: Normal range of motion.  Neurological: She is alert and oriented to person, place, and time.    Review of Systems  Constitutional: Negative.   HENT: Negative.   Eyes: Negative.   Respiratory: Negative.   Cardiovascular: Negative.   Gastrointestinal: Negative.    Genitourinary: Negative.   Musculoskeletal: Positive for myalgias.  Skin: Negative.   Neurological: Negative.   Endo/Heme/Allergies: Negative.   Psychiatric/Behavioral: Positive for depression and substance abuse. Negative for suicidal ideas.    Blood pressure 97/61, pulse 80, temperature 97.9 F (36.6 C), temperature source Oral, resp. rate 18, height 5\' 2"  (1.575 m), weight 55.7 kg, SpO2 100 %.Body mass index is 22.48 kg/m.  General Appearance: Disheveled  Eye Contact:  Fair  Speech:  Clear and Coherent and Normal Rate  Volume:  Decreased  Mood:  Anxious  Affect:  Congruent  Thought Process:  Linear and Descriptions of Associations: Intact  Orientation:  Full (Time, Place, and Person)  Thought Content:  WDL  Suicidal Thoughts:  No  Homicidal Thoughts:  No  Memory:  Immediate;   Good Recent;   Good Remote;   Good  Judgement:  Fair  Insight:  Fair  Psychomotor Activity:  Normal  Concentration:  Concentration: Fair  Recall:  [place] of Knowledge:  Fair  Language:  Good  Akathisia:  No  Handed:  Right  AIMS (if indicated):     Assets:  Communication Skills Desire for Improvement Financial Resources/Insurance Housing Social Support Transportation  ADL's:  Intact  Cognition:  WNL  Sleep:        Treatment Plan Summary: Daily contact with patient to assess and evaluate symptoms and progress in treatment and Medication management  Disposition: No evidence of imminent risk to self or others at present.   Patient does not meet criteria for psychiatric inpatient admission.  [place], FNP 01/25/2019 2:45 PM

## 2019-01-25 NOTE — Progress Notes (Signed)
     Rhonda [place] was admitted to the Hospital on 01/20/2019 and should be excused from court date on August 20.  2020.     Epifanio Lesches M.D on 01/25/2019,at 12:42 PM

## 2019-01-26 LAB — MAGNESIUM: Magnesium: 1.8 mg/dL (ref 1.7–2.4)

## 2019-01-26 MED ORDER — METOPROLOL TARTRATE 25 MG PO TABS
12.5000 mg | ORAL_TABLET | Freq: Two times a day (BID) | ORAL | Status: DC
  Administered 2019-01-27: 12.5 mg via ORAL
  Filled 2019-01-26 (×2): qty 1

## 2019-01-26 MED ORDER — CHLORDIAZEPOXIDE HCL 25 MG PO CAPS: 25.0000 mg | ORAL_CAPSULE | Freq: Four times a day (QID) | ORAL | Status: DC | PRN

## 2019-01-26 NOTE — Progress Notes (Addendum)
[facility] [place] NAME: Rhonda [place]    MR#:  [id number]  DATE OF BIRTH:  [birth year]  SUBJECTIVE: Slightly hypotensive, patient was sitting and eating breakfast when I saw her this morning.  Says that she does not feel GERD, complains of rib pain.  CHIEF COMPLAINT:   Chief Complaint  Patient presents with  . Withdrawal   Patient still has tremors, denies any hallucinations.  REVIEW OF SYSTEMS:    Review of Systems  Constitutional: Positive for malaise/fatigue. Negative for chills and fever.  HENT: Negative for sore throat.   Eyes: Negative for blurred vision, double vision and pain.  Respiratory: Negative for cough, hemoptysis, shortness of breath and wheezing.   Cardiovascular: Negative for chest pain, palpitations, orthopnea and leg swelling.  Gastrointestinal: Negative for abdominal pain, constipation, diarrhea, heartburn, nausea and vomiting.  Genitourinary: Negative for dysuria and hematuria.  Musculoskeletal: Negative for back pain and joint pain.  Skin: Negative for rash.  Neurological: Positive for tremors. Negative for dizziness, sensory change, speech change, focal weakness and headaches.  Endo/Heme/Allergies: Does not bruise/bleed easily.  Psychiatric/Behavioral: Negative for depression. The patient is not nervous/anxious.     DRUG ALLERGIES:  No Known Allergies  VITALS:  Blood pressure 102/64, pulse 91, temperature 98.7 F (37.1 C), temperature source Oral, resp. rate 15, height 5\' 2"  (1.575 m), weight 55.6 kg, SpO2 97 %.  PHYSICAL EXAMINATION:   Physical Exam  GENERAL:  43 y.o.-year-old patient sitting on side of the bed this morning, appears chronically ill.  EYES: Pupils equal, round, reactive to light  No scleral icterus. Extraocular muscles intact.  HEENT: Head atraumatic, normocephalic. Oropharynx and nasopharynx clear.  NECK:  Supple, no jugular venous distention. No thyroid enlargement, no tenderness.  LUNGS:  Normal breath sounds bilaterally, no wheezing, rales, rhonchi. No use of accessory muscles of respiration.  CARDIOVASCULAR: S1, S2 , regular. ABDOMEN: Soft, nontender, nondistended. Bowel sounds present. No organomegaly or mass.  EXTREMITIES: No cyanosis, clubbing or edema b/l.    NEUROLOGIC: Cranial nerves II through XII are intact. No focal Motor or sensory deficits b/l.   Tremors PSYCHIATRIC: The patient is alert and oriented x 3.  Anxious SKIN: No obvious rash, lesion, or ulcer.   LABORATORY PANEL:   CBC Recent Labs  Lab 01/25/19 0835  WBC 4.0  HGB 8.5*  HCT 26.6*  PLT 117*   ------------------------------------------------------------------------------------------------------------------ Chemistries  Recent Labs  Lab 01/25/19 0835  NA 135  K 3.6  CL 105  CO2 24  GLUCOSE 85  BUN <5*  CREATININE <0.30*  CALCIUM 7.5*  MG 1.8  AST 83*  ALT 23  ALKPHOS 134*  BILITOT 4.4*   ------------------------------------------------------------------------------------------------------------------  Cardiac Enzymes No results for input(s): TROPONINI in the last 168 hours. ------------------------------------------------------------------------------------------------------------------  RADIOLOGY:  No results found.   ASSESSMENT AND PLAN:   43 y.o. female with past medical history of chronic hepatitis C, CMT, hypertension, transaminitis, folate deficiency, EtOH abuse, tobacco abuse, rectal bleed and malnutrition presenting to the ED with EtOH withdrawal.  * Sinus tachycardia Due to alcohol withdrawal  resolved, continue low-dose metoprolol for now. * Alcohol abuse and withdrawal-, followed by  psychiatric, patient is on Librium, her CIWA score is only 2.    Average Drinks Per Day 1 L Vodka per day No Hx Seizures   Daily Thiamine, Folate, psychiatriy following, - SW consult for cessation resources -    *Anxiety/depression Continue Paxil*  Psychiatry following,  continue Paxil   - Chronic  thrombocytopenia, follows with Dr. Tasia Catchings - No signs of bleeding we will continue to monitor, platelets stable around 117 today.  *Recent diagnosis of right rib fractures.  Added tramadol as needed.  Continue Tylenol, avoid narcotics secondary to drug abuse history, continue incentive spirometer, did not work with physical therapy, hopefully we can arrange home health physical therapy and discharge her tomorrow.  * Hypokalemia  Replaced, potassium improved. Hypomagnesemia, corrected magnesium. * Alcoholic gastritis -continue Protonix and Carafate  * HTN Start edmetoprolol  * Polysubstance abuse -Counseling provided -Social work referral  * Intermittent rectal bleed - Following with GI Dr. Vicente Males - OP f/u -We will consult GI if significant drop in hemoglobin  * Elevated Bilirubin due to alcohol - Monitor, Alcohol dependence, alcohol withdrawal, anxiety, substance-induced mood disorder, psychiatry is following.   * DVT prophylaxis -As platelet count improved start Lovenox. Slight hypotension today, decrease dose of metoprolol, see how she does. All the records are reviewed and case discussed with Care Management/Social Worker Management plans discussed with the patient, family and they are in agreement.  CODE STATUS: FULL CODE  TOTAL  TIME TAKING CARE OF THIS PATIENT: 35 minutes.  More than 50% time spent in counseling, coordination of care POSSIBLE D/C IN  1-2 DAYS, DEPENDING ON CLINICAL CONDITION.  Epifanio Lesches M.D on 01/26/2019 at 1:34 PM  Between 7am to 6pm - Pager - [phone]  After 6pm go to [email or web address] - password [email or web address] [facility]  Office  [phone]  CC: Primary care physician; Volney American, PA-C  Note: This dictation was prepared with Dragon dictation along with smaller phrase technology. Any transcriptional errors that result from this process are unintentional.

## 2019-01-26 NOTE — Progress Notes (Signed)
PT Cancellation Note  Patient Details Name: ANSHIKA PETHTEL MRN: [id number] DOB: 08-Sep-1975   Cancelled Treatment:    Reason Eval/Treat Not Completed: Patient declined, no reason specified.  Pt resting in bed upon PT arrival.  Pt refusing any PT at this time d/t not feeling good; nurse notified.  Will re-attempt PT treatment at a later date/time.  Leitha Bleak, PT 01/26/19, 9:01 AM [phone]

## 2019-01-26 NOTE — Consult Note (Signed)
[facility] Consult   Reason for Consult:  Alcohol abuse Referring Physician:  Hospitalist Patient Identification: Rhonda [place] MRN:  [id number] Principal Diagnosis: <principal problem not specified> Diagnosis:  Active Problems:   Alcohol withdrawal syndrome ([place])   Generalized anxiety disorder   Anxious depression   Total Time spent with patient: 15 minutes  Subjective:   Rhonda [place] is a 43 y.o. female patient states that she is doing better today.  She denies any severe withdrawal symptoms.  She does report having severe anxiety.  Patient reports that there is some minor depression.  Patient denies any medication side effects.  Patient reports that she has been eating and sleeping a little bit better.  Patient denies any suicidal or homicidal ideations and denies any hallucinations.  HPI:  43 year old female with a past psychiatric history significant for alcohol dependence and a past medical history significant for chronic hepatitis C, hypertension, transaminitis, folate deficiency, tobacco abuse, GI bleed, right shoulder pain, and acute rib fracture who originally presented to the [facility] emergency department on 8/14.  01/26/2019: Patient is seen by this provider face-to-face.  Patient is much more calmer today and is pleasant and cooperative during evaluation.  Patient is lying in bed and does not appear to be in any distress however is reporting that she has severe anxiety.  She states that she is not having any withdrawal symptoms today.  She states that she just does not feel good in general.  After speaking with nurse patient has been continued complaining of increased anxiety but does not present with any anxious features and is lying calmly in the bed.  Suspect the patient may be having some drug-seeking behaviors to get more benzodiazepines.  I would recommend an change the Librium to 25 mg p.o. 4 times daily as needed for a CIWA greater  than 10 and recommend to monitor the use of the Ativan and to start tapering it off.  Currently the patient is on Ativan 1 mg p.o. 4 times daily as needed for anxiety and would recommend to go to either 1 mg 3 times daily as needed or to 0.5 mg 4 times daily as needed and then continue tapering.  Patient will continue denying any suicidal or homicidal ideations.  Patient continues to not meet inpatient criteria.  01/25/2019:Patient is seen by this provider face-to-face.  Patient seems restless in bed today and is complaining of severe pain and is requesting for the nurse to come in and see her.  She does deny any suicidal or homicidal ideations and denies any hallucinations today.  She states that she is not having any severe withdrawal symptoms either.  Patient continues to be focused on her pain.  She states that she does have some depression but states that the medications she thinks will help her and denied any side effects from the Paxil today.  Patient does not provide extensive information at this time and states that is due to her pain that she is having.  I did notify the secretary on the unit about the patient's request for the nurse to come and see her.  We will continue to follow up with patient for progression at this time the patient does not meet inpatient criteria.  Past Psychiatric History: alcohol dependence and anxiety  Risk to Self:   Risk to Others:   Prior Inpatient Therapy:   Prior Outpatient Therapy:    Past Medical History:  Past Medical History:  Diagnosis Date  .  Back injury   . Cervical cancer ([place])   . Charcot-Marie-Tooth disease   . Hypertension   . Leg injury     Past Surgical History:  Procedure Laterality Date  . BACK SURGERY    . LEG SURGERY     Family History:  Family History  Problem Relation Age of Onset  . Diabetes Mother   . Hypertension Mother   . Cancer Father        unknown what kind of cancer   . Hypertension Sister   . Hypertension Brother    . Heart attack Brother    Family Psychiatric  History: None reported Social History:  Social History   Substance and Sexual Activity  Alcohol Use Yes  . Alcohol/week: 5.0 standard drinks  . Types: 5 Shots of liquor per week   Comment: 1 pint per day drinker. Last drink around 0300     Social History   Substance and Sexual Activity  Drug Use Not Currently    Social History   Socioeconomic History  . Marital status: Divorced    Spouse name: Not on file  . Number of children: Not on file  . Years of education: Not on file  . Highest education level: Not on file  Occupational History  . Not on file  Social Needs  . Financial resource strain: Not on file  . Food insecurity    Worry: Not on file    Inability: Not on file  . Transportation needs    Medical: Not on file    Non-medical: Not on file  Tobacco Use  . Smoking status: Current Every Day Smoker    Packs/day: 0.25    Years: 30.00    Pack years: 7.50    Types: Cigarettes  . Smokeless tobacco: Never Used  Substance and Sexual Activity  . Alcohol use: Yes    Alcohol/week: 5.0 standard drinks    Types: 5 Shots of liquor per week    Comment: 1 pint per day drinker. Last drink around 0300  . Drug use: Not Currently  . Sexual activity: Not Currently    Birth control/protection: I.U.D.  Lifestyle  . Physical activity    Days per week: Not on file    Minutes per session: Not on file  . Stress: Not on file  Relationships  . Social Herbalist on phone: Not on file    Gets together: Not on file    Attends religious service: Not on file    Active member of club or organization: Not on file    Attends meetings of clubs or organizations: Not on file    Relationship status: Not on file  Other Topics Concern  . Not on file  Social History Narrative  . Not on file   Additional Social History:    Allergies:  No Known Allergies  Labs:  Results for orders placed or performed during the hospital  encounter of 01/20/19 (from the past 48 hour(s))  CBC     Status: Abnormal   Collection Time: 01/25/19  8:35 AM  Result Value Ref Range   WBC 4.0 4.0 - 10.5 K/uL   RBC 2.38 (L) 3.87 - 5.11 MIL/uL   Hemoglobin 8.5 (L) 12.0 - 15.0 g/dL   HCT 26.6 (L) 36.0 - 46.0 %   MCV 111.8 (H) 80.0 - 100.0 fL   MCH 35.7 (H) 26.0 - 34.0 pg   MCHC 32.0 30.0 - 36.0 g/dL   RDW 13.6 11.5 -  15.5 %   Platelets 117 (L) 150 - 400 K/uL   nRBC 0.0 0.0 - 0.2 %    Comment: Performed at [facility], [address] [place], [place] [address]  Comprehensive metabolic panel     Status: Abnormal   Collection Time: 01/25/19  8:35 AM  Result Value Ref Range   Sodium 135 135 - 145 mmol/L   Potassium 3.6 3.5 - 5.1 mmol/L   Chloride 105 98 - 111 mmol/L   CO2 24 22 - 32 mmol/L   Glucose, Bld 85 70 - 99 mg/dL   BUN <5 (L) 6 - 20 mg/dL   Creatinine, Ser <0.30 (L) 0.44 - 1.00 mg/dL   Calcium 7.5 (L) 8.9 - 10.3 mg/dL   Total Protein 5.9 (L) 6.5 - 8.1 g/dL   Albumin 1.9 (L) 3.5 - 5.0 g/dL   AST 83 (H) 15 - 41 U/L   ALT 23 0 - 44 U/L   Alkaline Phosphatase 134 (H) 38 - 126 U/L   Total Bilirubin 4.4 (H) 0.3 - 1.2 mg/dL   GFR calc non Af Amer NOT CALCULATED >60 mL/min   GFR calc Af Amer NOT CALCULATED >60 mL/min   Anion gap 6 5 - 15    Comment: Performed at [facility], [address], [place], [place] [address]  Magnesium     Status: None   Collection Time: 01/25/19  8:35 AM  Result Value Ref Range   Magnesium 1.8 1.7 - 2.4 mg/dL    Comment: Performed at [facility], [address], [place], [place] [address]  Magnesium     Status: None   Collection Time: 01/26/19  3:07 PM  Result Value Ref Range   Magnesium 1.8 1.7 - 2.4 mg/dL    Comment: Performed at [facility], [address] [place], [place] [address]    Current Facility-Administered Medications  Medication Dose Route Frequency Provider Last Rate Last Dose  . acetaminophen (TYLENOL) tablet 650 mg  650 mg Oral Q6H  PRN Hillary Bow, MD   650 mg at 01/22/19 2002  . chlordiazePOXIDE (LIBRIUM) capsule 25 mg  25 mg Oral QID PRN Rozell Theiler, Lowry Ram, FNP      . diphenoxylate-atropine (LOMOTIL) 2.5-0.025 MG per tablet 1 tablet  1 tablet Oral TID PRN Hillary Bow, MD   1 tablet at 01/21/19 1851  . enoxaparin (LOVENOX) injection 40 mg  40 mg Subcutaneous Q24H Epifanio Lesches, MD   40 mg at 01/25/19 2223  . folic acid (FOLVITE) tablet 1 mg  1 mg Oral Daily Lang Snow, NP   1 mg at 01/26/19 0955  . LORazepam (ATIVAN) tablet 1 mg  1 mg Oral Q6H PRN Epifanio Lesches, MD   1 mg at 01/26/19 1221  . metoprolol tartrate (LOPRESSOR) tablet 12.5 mg  12.5 mg Oral BID Epifanio Lesches, MD      . multivitamin with minerals tablet 1 tablet  1 tablet Oral Daily Delman Kitten, MD   1 tablet at 01/26/19 0955  . nicotine (NICODERM CQ - dosed in mg/24 hours) patch 14 mg  14 mg Transdermal Daily Hillary Bow, MD   14 mg at 01/22/19 0911  . pantoprazole (PROTONIX) EC tablet 40 mg  40 mg Oral Daily Lang Snow, NP   40 mg at 01/26/19 0955  . PARoxetine (PAXIL-CR) 24 hr tablet 12.5 mg  12.5 mg Oral Daily Patrecia Pour, NP   12.5 mg at 01/26/19 0955  . potassium chloride SA (K-DUR)  CR tablet 20 mEq  20 mEq Oral BID Lang Snow, NP   20 mEq at 01/26/19 0955  . sodium chloride flush (NS) 0.9 % injection 3 mL  3 mL Intravenous Q12H Lang Snow, NP   3 mL at 01/26/19 0956  . sodium chloride flush (NS) 0.9 % injection 3 mL  3 mL Intravenous PRN Lang Snow, NP      . sucralfate (CARAFATE) tablet 1 g  1 g Oral TID WC & HS Lang Snow, NP   1 g at 01/26/19 1219  . thiamine (VITAMIN B-1) tablet 100 mg  100 mg Oral Daily Delman Kitten, MD   100 mg at 01/26/19 5053   Or  . thiamine (B-1) injection 100 mg  100 mg Intravenous Daily Delman Kitten, MD   100 mg at 01/20/19 1115  . traMADol (ULTRAM) tablet 50 mg  50 mg Oral Q6H PRN Hillary Bow, MD         Musculoskeletal: Strength & Muscle Tone: within normal limits Gait & Station: Patient remained in bed during evaluation Patient leans: N/A  Psychiatric Specialty Exam: Physical Exam  Nursing note and vitals reviewed. Constitutional: She is oriented to person, place, and time. She appears well-developed and well-nourished.  Cardiovascular: Normal rate.  Respiratory: Effort normal.  Musculoskeletal: Normal range of motion.  Neurological: She is alert and oriented to person, place, and time.    Review of Systems  Constitutional: Negative.   HENT: Negative.   Eyes: Negative.   Respiratory: Negative.   Cardiovascular: Negative.   Gastrointestinal: Negative.   Genitourinary: Negative.   Musculoskeletal: Positive for myalgias.  Skin: Negative.   Neurological: Negative.   Endo/Heme/Allergies: Negative.   Psychiatric/Behavioral: Positive for depression and substance abuse. Negative for suicidal ideas.    Blood pressure 102/64, pulse 91, temperature 98.7 F (37.1 C), temperature source Oral, resp. rate 15, height 5\' 2"  (1.575 m), weight 55.6 kg, SpO2 97 %.Body mass index is 22.41 kg/m.  General Appearance: Disheveled  Eye Contact:  Fair  Speech:  Clear and Coherent and Normal Rate  Volume:  Decreased  Mood:  Anxious and Depressed  Affect:  Congruent  Thought Process:  Linear and Descriptions of Associations: Intact  Orientation:  Full (Time, Place, and Person)  Thought Content:  WDL  Suicidal Thoughts:  No  Homicidal Thoughts:  No  Memory:  Immediate;   Good Recent;   Good Remote;   Good  Judgement:  Fair  Insight:  Fair  Psychomotor Activity:  Normal  Concentration:  Concentration: Fair  Recall:  [place] of Knowledge:  Fair  Language:  Good  Akathisia:  No  Handed:  Right  AIMS (if indicated):     Assets:  Communication Skills Desire for Improvement Financial Resources/Insurance Housing Social Support Transportation  ADL's:  Intact  Cognition:  WNL   Sleep:        Treatment Plan Summary: Daily contact with patient to assess and evaluate symptoms and progress in treatment and Medication management  Change Librium to 25 mg PO QID PRN for CIWA >10 Monitor Ativan use for anxiety as patient shows some drug seeking behavior.  Would also recommend to change the Ativan 1 mg 4 times daily as needed to 0.5 mg 4 times daily as needed or 1 mg 3 times daily as needed and then continue tapering from either dose  Disposition: No evidence of imminent risk to self or others at present.   Patient does not meet criteria for  psychiatric inpatient admission.  [place], FNP 01/26/2019 4:55 PM

## 2019-01-26 NOTE — Progress Notes (Signed)
PT Cancellation Note  Patient Details Name: Rhonda [place] MRN: [id number] DOB: 1976/01/29   Cancelled Treatment:    Reason Eval/Treat Not Completed: Pain limiting ability to participate.  Pt reporting having too much rib pain to participate in physical therapy (OOB mobility or even LE ex's in bed); nurse notified who reports pt has been ambulating to bathroom with staff assist.  Will re-attempt PT session at a later date/time.  Leitha Bleak, PT 01/26/19, 11:57 AM [phone]

## 2019-01-26 NOTE — Progress Notes (Signed)
Patient refused bed alarm. Ambulated to the bathroom with out any assistance. Educated on the importance of it but still refused. Will continue to monitor.

## 2019-01-27 LAB — BASIC METABOLIC PANEL
Anion gap: 3 — ABNORMAL LOW (ref 5–15)
BUN: 5 mg/dL — ABNORMAL LOW (ref 6–20)
CO2: 26 mmol/L (ref 22–32)
Calcium: 7.9 mg/dL — ABNORMAL LOW (ref 8.9–10.3)
Chloride: 106 mmol/L (ref 98–111)
Creatinine, Ser: <0.3 mg/dL — ABNORMAL LOW (ref 0.44–1.00)
Glucose, Bld: 89 mg/dL (ref 70–99)
Potassium: 3.5 mmol/L (ref 3.5–5.1)
Sodium: 135 mmol/L (ref 135–145)

## 2019-01-27 LAB — CBC
HCT: 28.8 % — ABNORMAL LOW (ref 36.0–46.0)
Hemoglobin: 8.8 g/dL — ABNORMAL LOW (ref 12.0–15.0)
MCH: 36.2 pg — ABNORMAL HIGH (ref 26.0–34.0)
MCHC: 30.6 g/dL (ref 30.0–36.0)
MCV: 118.5 fL — ABNORMAL HIGH (ref 80.0–100.0)
Platelets: 160 10*3/uL (ref 150–400)
RBC: 2.43 MIL/uL — ABNORMAL LOW (ref 3.87–5.11)
RDW: 13.7 % (ref 11.5–15.5)
WBC: 4.1 10*3/uL (ref 4.0–10.5)
nRBC: 0 % (ref 0.0–0.2)

## 2019-01-27 MED ORDER — ADULT MULTIVITAMIN W/MINERALS CH: 1.0000 | ORAL_TABLET | Freq: Every day | ORAL | 0 refills | Status: DC

## 2019-01-27 MED ORDER — LORAZEPAM 0.5 MG PO TABS: 0.5000 mg | ORAL_TABLET | Freq: Three times a day (TID) | ORAL | 0 refills | Status: DC | PRN

## 2019-01-27 MED ORDER — PAROXETINE HCL ER 12.5 MG PO TB24: 12.5000 mg | ORAL_TABLET | Freq: Every day | ORAL | 0 refills | Status: DC

## 2019-01-27 MED ORDER — TRAMADOL HCL 50 MG PO TABS: 50.0000 mg | ORAL_TABLET | Freq: Four times a day (QID) | ORAL | 0 refills | Status: DC | PRN

## 2019-01-27 MED ORDER — CHLORDIAZEPOXIDE HCL 25 MG PO CAPS: 25.0000 mg | ORAL_CAPSULE | Freq: Four times a day (QID) | ORAL | 0 refills | Status: DC | PRN

## 2019-01-27 MED ORDER — THIAMINE HCL 100 MG PO TABS: 100.0000 mg | ORAL_TABLET | Freq: Every day | ORAL | 0 refills | Status: DC

## 2019-01-27 NOTE — TOC Transition Note (Signed)
Transition of Care [facility]) - CM/SW Discharge Note   Patient Details  Name: Rhonda [place] MRN: [id number] Date of Birth: May 10, 1976  [facility]) CM/SW Contact:  Sincere Berlanga, Lenice Llamas Phone Number: [phone]  01/27/2019, 4:12 PM   Clinical Narrative: Clinical Social Worker (CSW) attempted to meet with patient to discuss D/C plan however she already discharged home. CSW contacted patient via telephone and explained home health PT services. Patient refused home health PT and reported that she does not need it. CSW explained to patient to contact her PCP about home health orders if she changes her mind. Patient verbalized her understanding and reported no other needs or concerns. Please reconsult if future social work needs arise. CSW signing off.       Final next level of care: Home/Self Care Barriers to Discharge: Continued Medical Work up   Patient Goals and CMS Choice Patient states their goals for this hospitalization and ongoing recovery are:: to get better and go somewhere CMS Medicare.gov Compare Post Acute Care list provided to:: Patient Choice offered to / list presented to : Patient  Discharge Placement                       Discharge Plan and Services In-house Referral: Clinical Social Work Discharge Planning Services: CM Consult                                 Social Determinants of Health (SDOH) Interventions     Readmission Risk Interventions Readmission Risk Prevention Plan 01/21/2019  Post Dischage Appt Complete  Medication Screening Complete  Transportation Screening Complete

## 2019-01-27 NOTE — Progress Notes (Signed)
Pt is being discharged home.  Discharge papers given and explained to pt.  Pt verbalized understanding.  Meds and f/u appointments reviewed with pt.  Rx given.  Awaiting transportation.

## 2019-01-27 NOTE — Progress Notes (Signed)
PT Cancellation Note  Patient Details Name: KASSY PONTECORVO MRN: [id number] DOB: 1976-01-03   Cancelled Treatment:    Reason Eval/Treat Not Completed: Patient declined, no reason specified.  Pt resting in bed upon PT arrival.  Pt declining any physical therapy (walking or ex's) and reports that she has been doing ex's on her own in bed and will walk when she discharges today.  Nurse reports pt has been walking to bathroom.  Educated pt on importance of ambulation but pt reports she does better on her own and does not want to work with physical therapy.  Nurse notified.  Pt refused PT Tuesday, Wednesday, Thursday, and Friday (today).  D/t pt refusing PT multiple days in a row, will discontinue current PT order: nurse notified.  Please re-consult PT if needed and also if pt is willing to work with physical therapy.  Leitha Bleak, PT 01/27/19, 10:14 AM [phone]

## 2019-01-27 NOTE — TOC Progression Note (Signed)
Transition of Care [facility]) - Progression Note    Patient Details  Name: Rhonda [place] MRN: [id number] Date of Birth: Oct 04, 1975  [facility] CM/SW Contact  Ross Ludwig, [place] Phone Number: 01/27/2019, 9:32 AM  Clinical Narrative:     CSW was informed that patient is not interested in home health and has not been participating in therapy.  CSW will check again with patient at a later time.   Expected Discharge Plan: Home/Self Care Barriers to Discharge: Continued Medical Work up  Expected Discharge Plan and Services Expected Discharge Plan: Home/Self Care In-house Referral: Clinical Social Work Discharge Planning Services: CM Consult   Living arrangements for the past 2 months: Mobile Home Expected Discharge Date: 01/25/19                                     Social Determinants of Health (SDOH) Interventions    Readmission Risk Interventions Readmission Risk Prevention Plan 01/21/2019  Post Dischage Appt Complete  Medication Screening Complete  Transportation Screening Complete

## 2019-01-27 NOTE — Progress Notes (Signed)
Stable for discharge, patient has hypotension but asymptomatic.  Discontinue metoprolol at discharge.  Patient is eager to go home.  Can continue Librium for withdrawal symptoms of tremors, agitation as needed.  Will discontinue Ativan at discharge.  Patient has chronic hep C, hypertension, folate deficiency.  But patient blood pressure is low here so discontinue metoprolol.  Follow with PCP as an outpatient.  Refused to work with physical therapy yesterday, seen by physical therapy 4 days ago and recommended home health physical therapy.  We will arrange that.

## 2019-01-27 NOTE — Discharge Summary (Signed)
Rhonda Martin, is a 43 y.o. female  DOB Jul 13, 1975  MRN [id number].  Admission date:  01/20/2019  Admitting Physician  Lang Snow, NP  Discharge Date:  01/27/2019   Primary MD  Volney American, PA-C  Recommendations for primary care physician for things to follow:   Follow-up with PCP in 1 week    Admission Diagnosis  Transaminitis [R74.0] Elevated bilirubin [R17] Alcohol withdrawal syndrome with perceptual disturbance ([place]) [F10.232]   Discharge Diagnosis  Transaminitis [R74.0] Elevated bilirubin [R17] Alcohol withdrawal syndrome with perceptual disturbance (HCC) [F10.232]    Active Problems:   Alcohol withdrawal syndrome ([facility])   Generalized anxiety disorder   Anxious depression      Past Medical History:  Diagnosis Date  . Back injury   . Cervical cancer ([place])   . Charcot-Marie-Tooth disease   . Hypertension   . Leg injury     Past Surgical History:  Procedure Laterality Date  . BACK SURGERY    . LEG SURGERY         History of present illness and  Hospital Course:     Kindly see H&P for history of present illness and admission details, please review complete Labs, Consult reports and Test reports for all details in brief  HPI  from the history and physical done on the day of admission 43 year old female with history of hepatitis C, alcohol dependence, history of folate deficiency admitted on January 19, 2013 for alcohol withdrawal.   Hospital Course  #1 EtOH withdrawal, patient has alcohol abuse, average drinks per days 1 L of vodka, drink was previous night of admission, admitted to medical unit with telemetry monitoring, received IV thiamine, folic acid, CIWA protocol followed, including IV Ativan.  And finished withdrawal from alcohol, seen by psychiatry, started on Librium,  patient can use Librium as outpatient 25 mg 4 times daily as needed for alcohol withdrawal symptoms at discharge.  #2,.  Anxiety, depression, continue Paxil at discharge. 3.  Chronic thrombocytopenia, stable, can follow with Dr. Tasia Catchings as an outpatient. 4.  Recent history of fall with rib fractures, uses tramadol. patient has narcotic overuse disorder along with benzo which has resolved.  Patient is to see psychiatry as an outpatient.  Patient also need to follow-up with drug rehab centers to help herself from this. Her for tachycardia improved with metoprolol, discontinue metoprolol secondary to hypotension but patient asymptomatic.  No need of metoprolol at discharge.  Discharge Condition: stable   Follow UP      Discharge Instructions  and  Discharge Medications      Allergies as of 01/27/2019   No Known Allergies     Medication List    TAKE these medications   chlordiazePOXIDE 25 MG capsule Commonly known as: LIBRIUM Take 1 capsule (25 mg total) by mouth 4 (four) times daily as needed for withdrawal (for CIWA >10).   folic acid 1 MG tablet Commonly known as: FOLVITE Take 1 tablet (1 mg total) by mouth daily.   multivitamin with minerals Tabs tablet Take 1 tablet by mouth daily.   naproxen sodium 220 MG tablet Commonly known as: ALEVE Take 220 mg by mouth daily as needed.   pantoprazole 40 MG tablet Commonly known as: PROTONIX Take 1 tablet (40 mg total) by mouth daily.   PARoxetine 12.5 MG 24 hr tablet Commonly known as: PAXIL-CR Take 1 tablet (12.5 mg total) by mouth daily.   sucralfate 1 g tablet Commonly known as: Carafate Take 1 tablet (1  g total) by mouth 4 (four) times daily -  with meals and at bedtime.   thiamine 100 MG tablet Take 1 tablet (100 mg total) by mouth daily.   traMADol 50 MG tablet Commonly known as: ULTRAM Take 1 tablet (50 mg total) by mouth every 6 (six) hours as needed for moderate pain.         Diet and Activity  recommendation: See Discharge Instructions above   Consults obtained -psychiatry   Major procedures and Radiology Reports - PLEASE review detailed and final reports for all details, in brief -      Dg Shoulder Right  Result Date: 01/12/2019 CLINICAL DATA:  RIGHT shoulder pain and injury after slipping in shower and falling 2 nights ago, pain with movement EXAM: RIGHT SHOULDER - 2+ VIEW COMPARISON:  None FINDINGS: Osseous demineralization. AC joint alignment normal. No glenohumeral fracture or dislocation is identified. Clavicle and scapula appear intact. Minimally displaced fractures of the lateral RIGHT third and fourth ribs. Age-indeterminate fractures of the lateral RIGHT fifth and questionably sixth ribs. IMPRESSION: Acute fractures of the lateral RIGHT third and fourth ribs. Age-indeterminate fractures of the lateral RIGHT fifth and sixth ribs. Electronically Signed   By: Lavonia Dana M.D.   On: 01/12/2019 10:33    Micro Results     Recent Results (from the past 240 hour(s))  SARS CORONAVIRUS 2 Nasal Swab Aptima Multi Swab     Status: None   Collection Time: 01/20/19 10:52 AM   Specimen: Aptima Multi Swab; Nasal Swab  Result Value Ref Range Status   SARS Coronavirus 2 NEGATIVE NEGATIVE Final    Comment: (NOTE) SARS-CoV-2 target nucleic acids are NOT DETECTED. The SARS-CoV-2 RNA is generally detectable in upper and lower respiratory specimens during the acute phase of infection. Negative results do not preclude SARS-CoV-2 infection, do not rule out co-infections with other pathogens, and should not be used as the sole basis for treatment or other patient management decisions. Negative results must be combined with clinical observations, patient history, and epidemiological information. The expected result is Negative. Fact Sheet for Patients: SugarRoll.be Fact Sheet for Healthcare Providers: https://www.woods-mathews.com/ This test is  not yet approved or cleared by the [place] FDA and  has been authorized for detection and/or diagnosis of SARS-CoV-2 by FDA under an Emergency Use Authorization (EUA). This EUA will remain  in effect (meaning this test can be used) for the duration of the COVID-19 declaration under Section 56 4(b)(1) of the Act, 21 U.S.C. section 360bbb-3(b)(1), unless the authorization is terminated or revoked sooner. Performed at [facility], [place] [address], [place], [place] [address]        Today   Subjective:   Rhonda Martin today has no headache,no chest abdominal pain,no new weakness tingling or numbness, feels much better wants to go home today.   Objective:   Blood pressure (!) 98/46, pulse 98, temperature 98.8 F (37.1 C), temperature source Oral, resp. rate 18, height 5\' 2"  (1.575 m), weight 54.8 kg, SpO2 100 %.   Intake/Output Summary (Last 24 hours) at 01/27/2019 1542 Last data filed at 01/27/2019 1022 Gross per 24 hour  Intake 720 ml  Output 1 ml  Net 719 ml    Exam Awake Alert, Oriented x 3, No new F.N deficits, Normal affect [place].AT,PERRAL Supple Neck,No JVD, No cervical lymphadenopathy appriciated.  Symmetrical Chest wall movement, Good air movement bilaterally, CTAB RRR,No Gallops,Rubs or new Murmurs, No Parasternal Heave +ve B.Sounds, Abd Soft, Non tender, No organomegaly  appriciated, No rebound -guarding or rigidity. No Cyanosis, Clubbing or edema, No new Rash or bruise  Data Review   CBC w Diff:  Lab Results  Component Value Date   WBC 4.1 01/27/2019   HGB 8.8 (L) 01/27/2019   HGB 12.6 11/30/2018   HCT 28.8 (L) 01/27/2019   HCT 35.8 11/30/2018   PLT 160 01/27/2019   PLT 82 (LL) 11/30/2018   LYMPHOPCT 11 01/20/2019   MONOPCT 8 01/20/2019   EOSPCT 0 01/20/2019   BASOPCT 1 01/20/2019    CMP:  Lab Results  Component Value Date   NA 135 01/27/2019   NA 141 11/30/2018   K 3.5 01/27/2019   CL 106 01/27/2019   CO2 26 01/27/2019   BUN 5 (L)  01/27/2019   BUN 7 11/30/2018   CREATININE <0.30 (L) 01/27/2019   PROT 5.9 (L) 01/25/2019   PROT 7.6 11/30/2018   ALBUMIN 1.9 (L) 01/25/2019   ALBUMIN 4.1 11/30/2018   BILITOT 4.4 (H) 01/25/2019   BILITOT 0.8 11/30/2018   ALKPHOS 134 (H) 01/25/2019   AST 83 (H) 01/25/2019   ALT 23 01/25/2019  .   Total Time in preparing paper work, data evaluation and todays exam - 51 minutes  Epifanio Lesches M.D on 01/27/2019 at 3:42 PM    Note: This dictation was prepared with Dragon dictation along with smaller phrase technology. Any transcriptional errors that result from this process are unintentional.

## 2019-01-30 ENCOUNTER — Ambulatory Visit: Payer: Medicaid Other | Admitting: Gastroenterology

## 2019-01-31 ENCOUNTER — Telehealth: Payer: Self-pay

## 2019-01-31 NOTE — Telephone Encounter (Signed)
-----   Message from Jonathon Bellows, MD sent at 01/22/2019 12:01 PM EDT ----- Sherald Hess inform likely has had prior hepatitis B that the body has cleared.   Needs to stop drinking alcohol for atleast 6 months before we consider treatment for Hep C.  C/c Volney American, PA-C   Dr Jonathon Bellows MD,MRCP [facility]) Gastroenterology/Hepatology Pager: [phone]

## 2019-01-31 NOTE — Telephone Encounter (Signed)
Called pt to inform her of lab results and to reschedule her U/S appointment as pt was admitted the day of her appointment.  Unable to contact, no VM set up Unable to contact pt spouse, but was able to LVM to return call

## 2019-02-01 NOTE — Telephone Encounter (Signed)
Pt left vm returning a call °

## 2019-02-01 NOTE — Telephone Encounter (Signed)
Spoke with pt and informed her of lab results and Dr. Georgeann Oppenheim instructions. We were also able to reschedule pt ultrasound appointment.

## 2019-02-06 ENCOUNTER — Ambulatory Visit
Admission: RE | Admit: 2019-02-06 | Discharge: 2019-02-06 | Disposition: A | Discharge status: Home / Self Care | Payer: Medicaid Other | Source: Ambulatory Visit | Attending: Gastroenterology | Admitting: Gastroenterology

## 2019-02-06 ENCOUNTER — Other Ambulatory Visit: Payer: Self-pay | Admitting: *Deleted

## 2019-02-06 ENCOUNTER — Encounter: Payer: Self-pay | Admitting: Emergency Medicine

## 2019-02-06 ENCOUNTER — Inpatient Hospital Stay: Payer: Medicaid Other

## 2019-02-06 ENCOUNTER — Inpatient Hospital Stay
Admission: EM | Admit: 2019-02-06 | Discharge: 2019-02-08 | DRG: 434 | Disposition: A | Discharge status: Home / Self Care | Payer: Medicaid Other | Attending: Internal Medicine | Admitting: Internal Medicine

## 2019-02-06 ENCOUNTER — Other Ambulatory Visit: Payer: Self-pay

## 2019-02-06 DIAGNOSIS — F17210 Nicotine dependence, cigarettes, uncomplicated: Secondary | ICD-10-CM | POA: Diagnosis present

## 2019-02-06 DIAGNOSIS — K7030 Alcoholic cirrhosis of liver without ascites: Principal | ICD-10-CM | POA: Diagnosis present

## 2019-02-06 DIAGNOSIS — Z79899 Other long term (current) drug therapy: Secondary | ICD-10-CM

## 2019-02-06 DIAGNOSIS — E8809 Other disorders of plasma-protein metabolism, not elsewhere classified: Secondary | ICD-10-CM | POA: Diagnosis present

## 2019-02-06 DIAGNOSIS — Z8249 Family history of ischemic heart disease and other diseases of the circulatory system: Secondary | ICD-10-CM

## 2019-02-06 DIAGNOSIS — K7460 Unspecified cirrhosis of liver: Secondary | ICD-10-CM

## 2019-02-06 DIAGNOSIS — G600 Hereditary motor and sensory neuropathy: Secondary | ICD-10-CM | POA: Diagnosis present

## 2019-02-06 DIAGNOSIS — R600 Localized edema: Secondary | ICD-10-CM | POA: Diagnosis not present

## 2019-02-06 DIAGNOSIS — Z8541 Personal history of malignant neoplasm of cervix uteri: Secondary | ICD-10-CM

## 2019-02-06 DIAGNOSIS — F411 Generalized anxiety disorder: Secondary | ICD-10-CM | POA: Diagnosis present

## 2019-02-06 DIAGNOSIS — R945 Abnormal results of liver function studies: Secondary | ICD-10-CM

## 2019-02-06 DIAGNOSIS — R7989 Other specified abnormal findings of blood chemistry: Secondary | ICD-10-CM

## 2019-02-06 DIAGNOSIS — F1020 Alcohol dependence, uncomplicated: Secondary | ICD-10-CM | POA: Diagnosis present

## 2019-02-06 DIAGNOSIS — K219 Gastro-esophageal reflux disease without esophagitis: Secondary | ICD-10-CM | POA: Diagnosis present

## 2019-02-06 DIAGNOSIS — Z833 Family history of diabetes mellitus: Secondary | ICD-10-CM | POA: Diagnosis not present

## 2019-02-06 DIAGNOSIS — B182 Chronic viral hepatitis C: Secondary | ICD-10-CM | POA: Diagnosis present

## 2019-02-06 DIAGNOSIS — D696 Thrombocytopenia, unspecified: Secondary | ICD-10-CM

## 2019-02-06 DIAGNOSIS — I10 Essential (primary) hypertension: Secondary | ICD-10-CM | POA: Diagnosis present

## 2019-02-06 DIAGNOSIS — Z20828 Contact with and (suspected) exposure to other viral communicable diseases: Secondary | ICD-10-CM | POA: Diagnosis present

## 2019-02-06 HISTORY — DX: Unspecified cirrhosis of liver: K74.60

## 2019-02-06 HISTORY — DX: Inflammatory liver disease, unspecified: K75.9

## 2019-02-06 LAB — HEPATIC FUNCTION PANEL
ALT: 25 U/L (ref 0–44)
AST: 86 U/L — ABNORMAL HIGH (ref 15–41)
Albumin: 2.5 g/dL — ABNORMAL LOW (ref 3.5–5.0)
Alkaline Phosphatase: 107 U/L (ref 38–126)
Bilirubin, Direct: 1.1 mg/dL — ABNORMAL HIGH (ref 0.0–0.2)
Indirect Bilirubin: 1.1 mg/dL — ABNORMAL HIGH (ref 0.3–0.9)
Total Bilirubin: 2.2 mg/dL — ABNORMAL HIGH (ref 0.3–1.2)
Total Protein: 6.8 g/dL (ref 6.5–8.1)

## 2019-02-06 LAB — URINALYSIS, COMPLETE (UACMP) WITH MICROSCOPIC
Bilirubin Urine: NEGATIVE
Glucose, UA: NEGATIVE mg/dL
Hgb urine dipstick: NEGATIVE
Ketones, ur: NEGATIVE mg/dL
Leukocytes,Ua: NEGATIVE
Nitrite: NEGATIVE
Protein, ur: NEGATIVE mg/dL
Specific Gravity, Urine: 1.006 (ref 1.005–1.030)
pH: 6 (ref 5.0–8.0)

## 2019-02-06 LAB — URINE DRUG SCREEN, QUALITATIVE (ARMC ONLY)
Amphetamines, Ur Screen: NOT DETECTED
Barbiturates, Ur Screen: NOT DETECTED
Benzodiazepine, Ur Scrn: POSITIVE — AB
Cannabinoid 50 Ng, Ur ~~LOC~~: NOT DETECTED
Cocaine Metabolite,Ur ~~LOC~~: NOT DETECTED
MDMA (Ecstasy)Ur Screen: NOT DETECTED
Methadone Scn, Ur: NOT DETECTED
Opiate, Ur Screen: POSITIVE — AB
Phencyclidine (PCP) Ur S: NOT DETECTED
Tricyclic, Ur Screen: NOT DETECTED

## 2019-02-06 LAB — CBC
HCT: 31.6 % — ABNORMAL LOW (ref 36.0–46.0)
Hemoglobin: 10 g/dL — ABNORMAL LOW (ref 12.0–15.0)
MCH: 32.8 pg (ref 26.0–34.0)
MCHC: 31.6 g/dL (ref 30.0–36.0)
MCV: 103.6 fL — ABNORMAL HIGH (ref 80.0–100.0)
Platelets: 285 10*3/uL (ref 150–400)
RBC: 3.05 MIL/uL — ABNORMAL LOW (ref 3.87–5.11)
RDW: 16.3 % — ABNORMAL HIGH (ref 11.5–15.5)
WBC: 6.4 10*3/uL (ref 4.0–10.5)
nRBC: 0 % (ref 0.0–0.2)

## 2019-02-06 LAB — BASIC METABOLIC PANEL
Anion gap: 7 (ref 5–15)
BUN: 6 mg/dL (ref 6–20)
CO2: 25 mmol/L (ref 22–32)
Calcium: 8.1 mg/dL — ABNORMAL LOW (ref 8.9–10.3)
Chloride: 103 mmol/L (ref 98–111)
Creatinine, Ser: 0.38 mg/dL — ABNORMAL LOW (ref 0.44–1.00)
GFR calc Af Amer: >60 mL/min (ref >60)
GFR calc non Af Amer: >60 mL/min (ref >60)
Glucose, Bld: 89 mg/dL (ref 70–99)
Potassium: 4 mmol/L (ref 3.5–5.1)
Sodium: 135 mmol/L (ref 135–145)

## 2019-02-06 LAB — GLUCOSE, CAPILLARY: Glucose-Capillary: 82 mg/dL (ref 70–99)

## 2019-02-06 LAB — ETHANOL: Alcohol, Ethyl (B): <10 mg/dL (ref <10)

## 2019-02-06 MED ORDER — LORAZEPAM 0.5 MG PO TABS
0.5000 mg | ORAL_TABLET | Freq: Three times a day (TID) | ORAL | Status: DC | PRN
  Administered 2019-02-07 (×2): 0.5 mg via ORAL
  Filled 2019-02-06 (×2): qty 1

## 2019-02-06 MED ORDER — FOLIC ACID 1 MG PO TABS
1.0000 mg | ORAL_TABLET | Freq: Every day | ORAL | Status: DC
  Administered 2019-02-06 – 2019-02-08 (×3): 1 mg via ORAL
  Filled 2019-02-06 (×3): qty 1

## 2019-02-06 MED ORDER — TRAMADOL HCL 50 MG PO TABS
50.0000 mg | ORAL_TABLET | Freq: Four times a day (QID) | ORAL | Status: DC | PRN
  Administered 2019-02-06 – 2019-02-07 (×3): 50 mg via ORAL
  Filled 2019-02-06 (×3): qty 1

## 2019-02-06 MED ORDER — FUROSEMIDE 10 MG/ML IJ SOLN
20.0000 mg | Freq: Two times a day (BID) | INTRAMUSCULAR | Status: DC
  Administered 2019-02-07: 20 mg via INTRAVENOUS
  Filled 2019-02-06 (×2): qty 2

## 2019-02-06 MED ORDER — DOCUSATE SODIUM 100 MG PO CAPS: 100.0000 mg | ORAL_CAPSULE | Freq: Two times a day (BID) | ORAL | Status: DC | PRN

## 2019-02-06 MED ORDER — PAROXETINE HCL ER 12.5 MG PO TB24
12.5000 mg | ORAL_TABLET | Freq: Every day | ORAL | Status: DC
  Administered 2019-02-06 – 2019-02-08 (×3): 12.5 mg via ORAL
  Filled 2019-02-06 (×3): qty 1

## 2019-02-06 MED ORDER — SUCRALFATE 1 G PO TABS
1.0000 g | ORAL_TABLET | Freq: Three times a day (TID) | ORAL | Status: DC
  Administered 2019-02-06 – 2019-02-08 (×7): 1 g via ORAL
  Filled 2019-02-06 (×7): qty 1

## 2019-02-06 MED ORDER — HEPARIN SODIUM (PORCINE) 5000 UNIT/ML IJ SOLN
5000.0000 [IU] | Freq: Three times a day (TID) | INTRAMUSCULAR | Status: DC
  Administered 2019-02-06 – 2019-02-07 (×4): 5000 [IU] via SUBCUTANEOUS
  Filled 2019-02-06 (×5): qty 1

## 2019-02-06 MED ORDER — VITAMIN B-1 100 MG PO TABS
100.0000 mg | ORAL_TABLET | Freq: Every day | ORAL | Status: DC
  Administered 2019-02-06 – 2019-02-08 (×3): 100 mg via ORAL
  Filled 2019-02-06 (×3): qty 1

## 2019-02-06 MED ORDER — ADULT MULTIVITAMIN W/MINERALS CH
1.0000 | ORAL_TABLET | Freq: Every day | ORAL | Status: DC
  Administered 2019-02-06 – 2019-02-08 (×3): 1 via ORAL
  Filled 2019-02-06 (×3): qty 1

## 2019-02-06 MED ORDER — CHLORDIAZEPOXIDE HCL 25 MG PO CAPS: 25.0000 mg | ORAL_CAPSULE | Freq: Four times a day (QID) | ORAL | Status: DC | PRN

## 2019-02-06 MED ORDER — PANTOPRAZOLE SODIUM 40 MG PO TBEC
40.0000 mg | DELAYED_RELEASE_TABLET | Freq: Every day | ORAL | Status: DC
  Administered 2019-02-06 – 2019-02-08 (×3): 40 mg via ORAL
  Filled 2019-02-06 (×3): qty 1

## 2019-02-06 MED ORDER — MORPHINE SULFATE (PF) 2 MG/ML IV SOLN: 2.0000 mg | INTRAVENOUS | Status: DC | PRN

## 2019-02-06 NOTE — ED Notes (Signed)
While performing EKG pt very lethargic. Per pt's friend he is unsure if pt took any extra medications this am. Pt denies any extra medications. Pt does have hx of alcoholism. Friend states pt has not had anything to drink since being released from hospital.  First RN informed of situation and pt given bed at this time. RN Lorrie at bedside and MD Corky Downs

## 2019-02-06 NOTE — ED Notes (Signed)
ED TO INPATIENT HANDOFF REPORT  ED Nurse Name and Phone #: Lorrie & Judson Roch [phone]  S Name/Age/Gender Rhonda Martin 43 y.o. female Room/Bed: ED04A/ED04A  Code Status   Code Status: Prior  Home/SNF/Other Home Patient oriented to: self, place, time and situation Is this baseline? No , Pt extremely lethargic, but will wake up and answer orientation questions appropriately   Triage Complete: Triage complete  Chief Complaint feet swollen  Triage Note Patient with significant swelling to bilateral legs. States she was on her feet a lot on Saturday and not sure if that is what caused it. History of multiple surgeries on back and legs.    Allergies No Known Allergies  Level of Care/Admitting Diagnosis ED Disposition    ED Disposition Condition [facility]: [place] [100120]  Level of Care: Med-Surg [16]  Covid Evaluation: Asymptomatic Screening Protocol (No Symptoms)  Diagnosis: Bilateral leg edema [id number]  Admitting Physician: Vaughan Basta [id number]  Attending Physician: Vaughan Basta [phone]  Estimated length of stay: past midnight tomorrow  Certification:: I certify this patient will need inpatient services for at least 2 midnights  PT Class (Do Not Modify): Inpatient [101]  PT Acc Code (Do Not Modify): Private [1]       B Medical/Surgery History Past Medical History:  Diagnosis Date  . Back injury   . Cervical cancer ([place])   . Charcot-Marie-Tooth disease   . Hepatitis   . Hypertension   . Leg injury   . Liver cirrhosis [facility])    Past Surgical History:  Procedure Laterality Date  . BACK SURGERY    . LEG SURGERY       A IV Location/Drains/Wounds Patient Lines/Drains/Airways Status   Active Line/Drains/Airways    Name:   Placement date:   Placement time:   Site:   Days:   Peripheral IV 02/06/19 Right Antecubital   02/06/19    1442    Antecubital   less than 1          Intake/Output Last 24  hours No intake or output data in the 24 hours ending 02/06/19 1528  Labs/Imaging Results for orders placed or performed during the hospital encounter of 02/06/19 (from the past 48 hour(s))  Glucose, capillary     Status: None   Collection Time: 02/06/19 11:05 AM  Result Value Ref Range   Glucose-Capillary 82 70 - 99 mg/dL  Basic metabolic panel     Status: Abnormal   Collection Time: 02/06/19 11:07 AM  Result Value Ref Range   Sodium 135 135 - 145 mmol/L   Potassium 4.0 3.5 - 5.1 mmol/L   Chloride 103 98 - 111 mmol/L   CO2 25 22 - 32 mmol/L   Glucose, Bld 89 70 - 99 mg/dL   BUN 6 6 - 20 mg/dL   Creatinine, Ser 0.38 (L) 0.44 - 1.00 mg/dL   Calcium 8.1 (L) 8.9 - 10.3 mg/dL   GFR calc non Af Amer >60 >60 mL/min   GFR calc Af Amer >60 >60 mL/min   Anion gap 7 5 - 15    Comment: Performed at [facility], [address] [place], [place] [address]  CBC     Status: Abnormal   Collection Time: 02/06/19 11:07 AM  Result Value Ref Range   WBC 6.4 4.0 - 10.5 K/uL   RBC 3.05 (L) 3.87 - 5.11 MIL/uL   Hemoglobin 10.0 (L) 12.0 - 15.0 g/dL   HCT 31.6 (L) 36.0 -  46.0 %   MCV 103.6 (H) 80.0 - 100.0 fL   MCH 32.8 26.0 - 34.0 pg   MCHC 31.6 30.0 - 36.0 g/dL   RDW 16.3 (H) 11.5 - 15.5 %   Platelets 285 150 - 400 K/uL   nRBC 0.0 0.0 - 0.2 %    Comment: Performed at [facility], [address] [place], [place] [address]  Urinalysis, Complete w Microscopic     Status: Abnormal   Collection Time: 02/06/19  1:06 PM  Result Value Ref Range   Color, Urine YELLOW (A) YELLOW   APPearance CLEAR (A) CLEAR   Specific Gravity, Urine 1.006 1.005 - 1.030   pH 6.0 5.0 - 8.0   Glucose, UA NEGATIVE NEGATIVE mg/dL   Hgb urine dipstick NEGATIVE NEGATIVE   Bilirubin Urine NEGATIVE NEGATIVE   Ketones, ur NEGATIVE NEGATIVE mg/dL   Protein, ur NEGATIVE NEGATIVE mg/dL   Nitrite NEGATIVE NEGATIVE   Leukocytes,Ua NEGATIVE NEGATIVE   RBC / HPF 0-5 0 - 5 RBC/hpf   WBC, UA 0-5 0 - 5 WBC/hpf    Bacteria, UA RARE (A) NONE SEEN   Squamous Epithelial / LPF 0-5 0 - 5    Comment: Performed at [facility], [address] [place], [place] [address]  Ethanol     Status: None   Collection Time: 02/06/19  1:38 PM  Result Value Ref Range   Alcohol, Ethyl (B) <10 <10 mg/dL    Comment: (NOTE) Lowest detectable limit for serum alcohol is 10 mg/dL. For medical purposes only. Performed at [facility], [address] [place], [place] [address]    [place] Elastography Liver  Result Date: 02/06/2019 CLINICAL DATA:  Concern for cirrhosis, hepatitis-C. EXAM: US LIVER ELASTOGRAPHY TECHNIQUE: Ultrasound elastography evaluation of the liver was performed. A region of interest was placed in the right lobe of the liver. Following application of a compressive sonographic pulse, shear waves were detected in the adjacent hepatic tissue and the shear wave velocity was calculated. Multiple assessments were performed at the selected site. Median shear wave velocity is correlated to a Metavir fibrosis score. COMPARISON:  None. FINDINGS: Liver: Increased liver echogenicity. No focal lesion. No biliary duct dilatation. Portal vein is patent on color Doppler imaging with normal direction of blood flow towards the liver. ULTRASOUND HEPATIC ELASTOGRAPHY Device: Siemens Helix VTQ Patient position: Supine Transducer: 5C1 Number of measurements: 10 Hepatic segment:  8 Median velocity:   4.4 m/sec IQR: 0.29 IQR/Median velocity ratio: 0.07 Corresponding Metavir fibrosis score:  Some F3 + F4 Risk of fibrosis: High Limitations of exam: None Please note that abnormal shear wave velocities may also be identified in clinical settings other than with hepatic fibrosis, such as: acute hepatitis, elevated right heart and central venous pressures including use of beta blockers, veno-occlusive disease (Budd-Chiari), infiltrative processes such as mastocytosis/amyloidosis/infiltrative tumor, extrahepatic cholestasis, in  the post-prandial state, and liver transplantation. Correlation with patient history, laboratory data, and clinical condition recommended. IMPRESSION: Liver: Increased liver echogenicity.  No focal lesion identified Elastography: Median hepatic shear wave velocity is calculated at 4.4 m/sec. Corresponding Metavir fibrosis score is Some F3 + F4. Risk of fibrosis is High. Follow-up: Follow up advised Electronically Signed   By: Suzy Bouchard M.D.   On: 02/06/2019 11:29    Pending Labs Unresulted Labs (From admission, onward)    Start     Ordered   02/07/19 0500  Protime-INR  Tomorrow morning,   STAT     02/06/19 1507  02/06/19 1432  Hepatic function panel  Add-on,   AD     02/06/19 1431   02/06/19 1316  SARS CORONAVIRUS 2 (TAT 6-24 HRS) Nasopharyngeal Nasopharyngeal Swab  (Asymptomatic/Tier 2 Patients Labs)  Once,   STAT    Question Answer Comment  Is this test for diagnosis or screening Screening   Symptomatic for COVID-19 as defined by CDC No   Hospitalized for COVID-19 No   Admitted to ICU for COVID-19 No   Previously tested for COVID-19 No   Resident in a congregate (group) care setting No   Employed in healthcare setting No   Pregnant No      02/06/19 1315   02/06/19 1315  Urine Drug Screen, Qualitative ([place] only)  Once,   STAT     02/06/19 1314   Signed and Held  Basic metabolic panel  Tomorrow morning,   R     Signed and Held   Signed and Held  CBC  Tomorrow morning,   R     Signed and Held   Signed and Held  CBC  (heparin)  Once,   R    Comments: Baseline for heparin therapy IF NOT ALREADY DRAWN.  Notify MD if PLT < 100 K.    Signed and Held   Signed and Held  Creatinine, serum  (heparin)  Once,   R    Comments: Baseline for heparin therapy IF NOT ALREADY DRAWN.    Signed and Held          Vitals/Pain Today's Vitals   02/06/19 1218 02/06/19 1306 02/06/19 1400 02/06/19 1519  BP: (!) 87/63 109/71 110/75 106/66  Pulse:  85  85  Resp: 12 12 12 12   Temp:       TempSrc:      SpO2: 93% 95%  95%  Weight:      Height:      PainSc:    9     Isolation Precautions No active isolations  Medications Medications  furosemide (LASIX) injection 20 mg (has no administration in time range)  morphine 2 MG/ML injection 2 mg (has no administration in time range)    Mobility Walks ( pt currently unsteady on feet alone due to lethargy)  Moderate fall risk   Focused Assessments ,.   R Recommendations: See Admitting Provider Note  Report given to:   Additional Notes:  Family member concerned pt may have taken additional medication

## 2019-02-06 NOTE — ED Notes (Addendum)
Pt very lethargic and limbs somewhat limb when pt was attempting to get up. Pt able to bare weight and move with assistance but when sitting on toilet pt was leaning so far forward without assistance she would have fell into floor.

## 2019-02-06 NOTE — Progress Notes (Signed)
Family Meeting Note  Advance Directive:yes  Today a meeting took place with the Patient.  The following clinical team members were present during this meeting:MD  The following were discussed:Patient's diagnosis: Liver cirrhosis, hepatitis C, Htn, leg edema, Patient's progosis: Unable to determine and Goals for treatment: Full Code  Additional follow-up to be provided: PMD  Time spent during discussion:20 minutes  Vaughan Basta, MD

## 2019-02-06 NOTE — ED Triage Notes (Signed)
Patient with significant swelling to bilateral legs. States she was on her feet a lot on Saturday and not sure if that is what caused it. History of multiple surgeries on back and legs.

## 2019-02-06 NOTE — ED Provider Notes (Signed)
[facility] Emergency Department Provider Note   ____________________________________________    I have reviewed the triage vital signs and the nursing notes.   HISTORY  Chief Complaint Leg Swelling     HPI Rhonda Martin is a 43 y.o. female with a history of alcohol abuse, reported cirrhosis who presents today with complaints of severe lower extremity swelling.  She reports swelling has developed over the last several days and is quite painful.  She does not take anything for this but reports compliance with her medications.  She is never had this before.  Denies abdominal pain.  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 Authorizing Provider  chlordiazePOXIDE (LIBRIUM) 25 MG capsule Take 1 capsule (25 mg total) by mouth 4 (four) times daily as needed for withdrawal (for CIWA >10). 01/27/19   Epifanio Lesches, MD  folic acid (FOLVITE) 1 MG tablet Take 1 tablet (1 mg total) by mouth daily. 12/20/18   Earlie Server, MD  Multiple Vitamin (MULTIVITAMIN WITH MINERALS) TABS tablet Take 1 tablet by mouth daily. 01/27/19   Epifanio Lesches, MD  naproxen  sodium (ALEVE) 220 MG tablet Take 220 mg by mouth daily as needed.     [provider]  pantoprazole (PROTONIX) 40 MG tablet Take 1 tablet (40 mg total) by mouth daily. 11/30/18   Volney American, PA-C  PARoxetine (PAXIL-CR) 12.5 MG 24 hr tablet Take 1 tablet (12.5 mg total) by mouth daily. 01/27/19   Epifanio Lesches, MD  sucralfate (CARAFATE) 1 g tablet Take 1 tablet (1 g total) by mouth 4 (four) times daily -  with meals and at bedtime. 11/30/18   Volney American, PA-C  thiamine 100 MG tablet Take 1 tablet (100 mg total) by mouth daily. 01/27/19   Epifanio Lesches, MD  traMADol (ULTRAM) 50 MG tablet Take 1 tablet (50 mg total) by mouth every 6 (six) hours as needed for moderate pain. 01/27/19   Epifanio Lesches, MD     Allergies Patient has no known allergies.  Family History  Problem Relation Age of Onset   Diabetes Mother    Hypertension Mother    Cancer Father        unknown what kind of cancer    Hypertension Sister    Hypertension Brother    Heart attack Brother     Social History Social History   Tobacco Use   Smoking status: Current Every Day Smoker    Packs/day: 0.25    Years: 30.00    Pack years: 7.50    Types: Cigarettes   Smokeless tobacco: Never Used  Substance Use Topics  Alcohol use: Yes    Alcohol/week: 5.0 standard drinks    Types: 5 Shots of liquor per week    Comment: 1 pint per day drinker. Last drink around 0300   Drug use: Not Currently    Review of Systems  Constitutional: No fever/chills Eyes: No visual changes.  ENT: No sore throat. Cardiovascular: Denies chest pain. Respiratory: Denies shortness of breath. Gastrointestinal: No abdominal pain.  No nausea, no vomiting.   Genitourinary: Negative for dysuria. Musculoskeletal: As above Skin: Negative for rash. Neurological: Negative for headaches or weakness   ____________________________________________   PHYSICAL EXAM:  VITAL SIGNS: ED Triage  Vitals  Enc Vitals Group     BP 02/06/19 1051 113/73     Pulse Rate 02/06/19 1051 93     Resp 02/06/19 1051 18     Temp 02/06/19 1051 98.7 F (37.1 C)     Temp Source 02/06/19 1051 Oral     SpO2 02/06/19 1051 98 %     Weight 02/06/19 1052 54.8 kg (120 lb 13 oz)     Height 02/06/19 1052 1.575 m (5\' 2" )     Head Circumference --      Peak Flow --      Pain Score 02/06/19 1052 9     Pain Loc --      Pain Edu? --      Excl. in [place]? --     Constitutional: Alert and oriented.  Eyes: Conjunctivae are normal.   Nose: No congestion/rhinnorhea. Mouth/Throat: Mucous membranes are moist.    Cardiovascular: Normal rate, regular rhythm. Grossly normal heart sounds.  Good peripheral circulation. Respiratory: Normal respiratory effort.  No retractions. Lungs CTAB. Gastrointestinal: Soft and nontender. No distention.  No CVA tenderness.  Musculoskeletal: 3+ lower extremity edema bilaterally Neurologic:  Normal speech and language. No gross focal neurologic deficits are appreciated.  Skin:  Skin is warm, dry and intact. No rash noted. Psychiatric: Mood and affect are normal. Speech and behavior are normal.  ____________________________________________   LABS (all labs ordered are listed, but only abnormal results are displayed)  Labs Reviewed  BASIC METABOLIC PANEL - Abnormal; Notable for the following components:      Result Value   Creatinine, Ser 0.38 (*)    Calcium 8.1 (*)    All other components within normal limits  CBC - Abnormal; Notable for the following components:   RBC 3.05 (*)    Hemoglobin 10.0 (*)    HCT 31.6 (*)    MCV 103.6 (*)    RDW 16.3 (*)    All other components within normal limits  URINALYSIS, COMPLETE (UACMP) WITH MICROSCOPIC - Abnormal; Notable for the following components:   Color, Urine YELLOW (*)    APPearance CLEAR (*)    Bacteria, UA RARE (*)    All other components within normal limits  SARS CORONAVIRUS 2 (TAT 6-24 HRS)  GLUCOSE, CAPILLARY    ETHANOL  URINE DRUG SCREEN, QUALITATIVE ([facility] ONLY)  HEPATIC FUNCTION PANEL  CBG MONITORING, ED   ____________________________________________  EKG   ____________________________________________  RADIOLOGY   ____________________________________________   PROCEDURES  Procedure(s) performed: yes  Angiocath insertion Performed by: Lavonia Drafts  Consent: Verbal consent obtained. Risks and benefits: risks, benefits and alternatives were discussed Time out: Immediately prior to procedure a "time out" was called to verify the correct patient, procedure, equipment, support staff and site/side marked as required.  Preparation: Patient was prepped and draped in the usual sterile fashion.  Vein Location: right AC  Ultrasound Guided  Gauge: 18  Normal blood return and flush without difficulty Patient tolerance: Patient tolerated the procedure well with no immediate complications.     Procedures   Critical Care performed: No ____________________________________________   INITIAL IMPRESSION / ASSESSMENT AND PLAN / ED COURSE  Pertinent labs & imaging results that were available during my care of the patient were reviewed by me and considered in my medical decision making (see chart for details).  Patient presents with complaint of bilateral lower extremity swelling and edema she has significant edema.  Initial blood pressures are somewhat soft although improved without intervention.  She will require diuresis and given her low blood pressures plan is to admit.      ____________________________________________   FINAL CLINICAL IMPRESSION(S) / ED DIAGNOSES  Final diagnoses:  Bilateral lower extremity edema  Cirrhosis of liver without ascites, unspecified hepatic cirrhosis type ([place])        Note:  This document was prepared using Dragon voice recognition software and may include unintentional dictation errors.   Lavonia Drafts, MD 02/06/19 1505

## 2019-02-06 NOTE — H&P (Signed)
[place] [facility] [place] NAME: Rhonda Martin    MR#:  [id number]  DATE OF BIRTH:  [birth year]  DATE OF ADMISSION:  02/06/2019  PRIMARY CARE PHYSICIAN: Volney American, PA-C   REQUESTING/REFERRING PHYSICIAN: Corky Downs  CHIEF COMPLAINT:   Chief Complaint  Patient presents with  . Leg Swelling    HISTORY OF PRESENT ILLNESS: Rhonda Martin  is a 43 y.o. female with a known history of cervical cancer, hepatitis C, liver cirrhosis, alcoholism-was admitted to hospital with alcohol withdrawal 2 weeks ago.  She had some old injury on both legs as a child and surgeries and loads in her legs. She claims since last admission she quit drinking alcohol.  Last few days her legs are getting swollen and it is painful that she is not able to walk much due to that. She denies any new injuries.  She denies any fever chills or abdominal swelling associated with that.  PAST MEDICAL HISTORY:   Past Medical History:  Diagnosis Date  . Back injury   . Cervical cancer ([place])   . Charcot-Marie-Tooth disease   . Hepatitis   . Hypertension   . Leg injury   . Liver cirrhosis ([place])     PAST SURGICAL HISTORY:  Past Surgical History:  Procedure Laterality Date  . BACK SURGERY    . LEG SURGERY      SOCIAL HISTORY:  Social History   Tobacco Use  . Smoking status: Current Every Day Smoker    Packs/day: 0.25    Years: 30.00    Pack years: 7.50    Types: Cigarettes  . Smokeless tobacco: Never Used  Substance Use Topics  . Alcohol use: Yes    Alcohol/week: 5.0 standard drinks    Types: 5 Shots of liquor per week    Comment: 1 pint per day drinker. Last drink around 0300    FAMILY HISTORY:  Family History  Problem Relation Age of Onset  . Diabetes Mother   . Hypertension Mother   . Cancer Father        unknown what kind of cancer   . Hypertension Sister   . Hypertension Brother   . Heart attack Brother     DRUG ALLERGIES: No Known Allergies  REVIEW OF  SYSTEMS:   CONSTITUTIONAL: No fever, have fatigue or weakness.  EYES: No blurred or double vision.  EARS, NOSE, AND THROAT: No tinnitus or ear pain.  RESPIRATORY: No cough, shortness of breath, wheezing or hemoptysis.  CARDIOVASCULAR: No chest pain, orthopnea, have bilateral leg edema.  GASTROINTESTINAL: No nausea, vomiting, diarrhea or abdominal pain.  GENITOURINARY: No dysuria, hematuria.  ENDOCRINE: No polyuria, nocturia,  HEMATOLOGY: No anemia, easy bruising or bleeding SKIN: No rash or lesion. MUSCULOSKELETAL: No joint pain or arthritis.   NEUROLOGIC: No tingling, numbness, weakness.  PSYCHIATRY: No anxiety or depression.   MEDICATIONS AT HOME:  Prior to Admission medications   Medication Sig Start Date End Date Taking? Authorizing Provider  chlordiazePOXIDE (LIBRIUM) 25 MG capsule Take 1 capsule (25 mg total) by mouth 4 (four) times daily as needed for withdrawal (for CIWA >10). 01/27/19  Yes Epifanio Lesches, MD  folic acid (FOLVITE) 1 MG tablet Take 1 tablet (1 mg total) by mouth daily. 12/20/18  Yes Earlie Server, MD  LORazepam (ATIVAN) 0.5 MG tablet Take 0.5 mg by mouth every 8 (eight) hours as needed for anxiety. 01/27/19  Yes [provider]  Multiple Vitamin (MULTIVITAMIN WITH MINERALS) TABS tablet Take 1  tablet by mouth daily. 01/27/19  Yes Epifanio Lesches, MD  pantoprazole (PROTONIX) 40 MG tablet Take 1 tablet (40 mg total) by mouth daily. 11/30/18  Yes Volney American, PA-C  PARoxetine (PAXIL-CR) 12.5 MG 24 hr tablet Take 1 tablet (12.5 mg total) by mouth daily. 01/27/19  Yes Epifanio Lesches, MD  thiamine 100 MG tablet Take 1 tablet (100 mg total) by mouth daily. 01/27/19  Yes Epifanio Lesches, MD  traMADol (ULTRAM) 50 MG tablet Take 1 tablet (50 mg total) by mouth every 6 (six) hours as needed for moderate pain. 01/27/19  Yes Epifanio Lesches, MD  naproxen sodium (ALEVE) 220 MG tablet Take 220 mg by mouth daily as needed.     [provider]  sucralfate (CARAFATE) 1 g tablet Take 1 tablet (1 g total) by mouth 4 (four) times daily -  with meals and at bedtime. Patient not taking: Reported on 02/06/2019 11/30/18   Volney American, PA-C      PHYSICAL EXAMINATION:   VITAL SIGNS: Blood pressure 106/66, pulse 85, temperature 98.7 F (37.1 C), temperature source Oral, resp. rate 12, height 5\' 2"  (1.575 m), weight 54.8 kg, SpO2 95 %.  GENERAL:  43 y.o.-year-old patient lying in the bed with acute distress and crying due to pain. EYES: Pupils equal, round, reactive to light and accommodation. No scleral icterus. Extraocular muscles intact.  HEENT: Head atraumatic, normocephalic. Oropharynx and nasopharynx clear.  NECK:  Supple, no jugular venous distention. No thyroid enlargement, no tenderness.  LUNGS: Normal breath sounds bilaterally, no wheezing, rales,rhonchi or crepitation. No use of accessory muscles of respiration.  CARDIOVASCULAR: S1, S2 normal. No murmurs, rubs, or gallops.  ABDOMEN: Soft, nontender, nondistended. Bowel sounds present. No organomegaly or mass.  EXTREMITIES: Lateral leg and feet pedal edema, no cyanosis, or clubbing.  Right leg has old surgical marks. Tenderness on both legs but no redness.  NEUROLOGIC: Cranial nerves II through XII are intact. Muscle strength 5/5 in all extremities.  Sensation intact. Gait not checked.  PSYCHIATRIC: The patient is alert and oriented x 3.  SKIN: No obvious rash, lesion, or ulcer.      LABORATORY PANEL:   CBC Recent Labs  Lab 02/06/19 1107  WBC 6.4  HGB 10.0*  HCT 31.6*  PLT 285  MCV 103.6*  MCH 32.8  MCHC 31.6  RDW 16.3*   ------------------------------------------------------------------------------------------------------------------  Chemistries  Recent Labs  Lab 02/06/19 1107  NA 135  K 4.0  CL 103  CO2 25  GLUCOSE 89  BUN 6  CREATININE 0.38*  CALCIUM 8.1*    ------------------------------------------------------------------------------------------------------------------ estimated creatinine clearance is 71.7 mL/min (A) (by C-G formula based on SCr of 0.38 mg/dL (L)). ------------------------------------------------------------------------------------------------------------------ No results for input(s): TSH, T4TOTAL, T3FREE, THYROIDAB in the last 72 hours.  Invalid input(s): FREET3   Coagulation profile No results for input(s): INR, PROTIME in the last 168 hours. ------------------------------------------------------------------------------------------------------------------- No results for input(s): DDIMER in the last 72 hours. -------------------------------------------------------------------------------------------------------------------  Cardiac Enzymes No results for input(s): CKMB, TROPONINI, MYOGLOBIN in the last 168 hours.  Invalid input(s): CK ------------------------------------------------------------------------------------------------------------------ Invalid input(s): POCBNP  ---------------------------------------------------------------------------------------------------------------  Urinalysis    Component Value Date/Time   COLORURINE YELLOW (A) 02/06/2019 1306   APPEARANCEUR CLEAR (A) 02/06/2019 1306   APPEARANCEUR Clear 11/30/2018 1616   LABSPEC 1.006 02/06/2019 1306   PHURINE 6.0 02/06/2019 1306   GLUCOSEU NEGATIVE 02/06/2019 1306   HGBUR NEGATIVE 02/06/2019 1306   BILIRUBINUR NEGATIVE 02/06/2019 1306   BILIRUBINUR Negative 11/30/2018 [place] 02/06/2019 1306  PROTEINUR NEGATIVE 02/06/2019 1306   NITRITE NEGATIVE 02/06/2019 1306   LEUKOCYTESUR NEGATIVE 02/06/2019 1306     RADIOLOGY: [place] Elastography Liver  Result Date: 02/06/2019 CLINICAL DATA:  Concern for cirrhosis, hepatitis-C. EXAM: US LIVER ELASTOGRAPHY TECHNIQUE: Ultrasound elastography evaluation of the liver was performed.  A region of interest was placed in the right lobe of the liver. Following application of a compressive sonographic pulse, shear waves were detected in the adjacent hepatic tissue and the shear wave velocity was calculated. Multiple assessments were performed at the selected site. Median shear wave velocity is correlated to a Metavir fibrosis score. COMPARISON:  None. FINDINGS: Liver: Increased liver echogenicity. No focal lesion. No biliary duct dilatation. Portal vein is patent on color Doppler imaging with normal direction of blood flow towards the liver. ULTRASOUND HEPATIC ELASTOGRAPHY Device: Siemens Helix VTQ Patient position: Supine Transducer: [id number] Number of measurements: 10 Hepatic segment:  8 Median velocity:   4.4 m/sec IQR: 0.29 IQR/Median velocity ratio: 0.07 Corresponding Metavir fibrosis score:  Some F3 + F4 Risk of fibrosis: High Limitations of exam: None Please note that abnormal shear wave velocities may also be identified in clinical settings other than with hepatic fibrosis, such as: acute hepatitis, elevated right heart and central venous pressures including use of beta blockers, veno-occlusive disease ([place]), infiltrative processes such as mastocytosis/amyloidosis/infiltrative tumor, extrahepatic cholestasis, in the post-prandial state, and liver transplantation. Correlation with patient history, laboratory data, and clinical condition recommended. IMPRESSION: Liver: Increased liver echogenicity.  No focal lesion identified Elastography: Median hepatic shear wave velocity is calculated at 4.4 m/sec. Corresponding Metavir fibrosis score is Some F3 + F4. Risk of fibrosis is High. Follow-up: Follow up advised Electronically Signed   By: Suzy Bouchard M.D.   On: 02/06/2019 11:29    EKG: Orders placed or performed during the hospital encounter of 02/06/19  . ED EKG  . ED EKG    IMPRESSION AND PLAN:  *Bilateral leg edema with pain No redness.  Currently does not appear to be  infected. She has old injuries on the right leg with surgical marks and some deformity in feet Denies any new injury. I will give IV Lasix and get ultrasound Doppler study to rule out DVT. Most likely this could be due to her liver cirrhosis. Pain management as needed.  *Liver cirrhosis Hepatitis C Continue to monitor Currently she is saying she quit drinking alcohol 2 weeks ago.  *Gastroesophageal reflux disease Continue pantoprazole.  *Active smoking Counseled to quit smoking for 4 minutes and offered nicotine patch.  All the records are reviewed and case discussed with ED provider. Management plans discussed with the patient, family and they are in agreement.  CODE STATUS: Full code Code Status History    Date Active Date Inactive Code Status Order ID Comments User Context   01/20/2019 [id number] 01/27/2019 1710 Full Code [id number]  Lang Snow, NP ED   Advance Care Planning Activity       TOTAL TIME TAKING CARE OF THIS PATIENT: 45 minutes.    Vaughan Basta M.D on 02/06/2019   Between 7am to 6pm - Pager - [phone]  After 6pm go to [email or web address] - password [email or web address] [facility]  Office  [phone]  CC: Primary care physician; Volney American, PA-C   Note: This dictation was prepared with Dragon dictation along with smaller phrase technology. Any transcriptional errors that result from this process are unintentional.

## 2019-02-06 NOTE — ED Notes (Signed)
Pt states she is not really having any pain at this time. Pt appears extremely lethargic. Pt friend reported pt may have taking medication. Pt denies taking any extra medication except what is prescribed to her.

## 2019-02-07 ENCOUNTER — Inpatient Hospital Stay
Admit: 2019-02-07 | Discharge: 2019-02-07 | Disposition: A | Discharge status: Home / Self Care | Payer: Medicaid Other | Attending: Internal Medicine | Admitting: Internal Medicine

## 2019-02-07 LAB — ECHOCARDIOGRAM COMPLETE
Height: 64 in
Weight: 2190.4 oz

## 2019-02-07 LAB — CBC
HCT: 26.4 % — ABNORMAL LOW (ref 36.0–46.0)
Hemoglobin: 8.2 g/dL — ABNORMAL LOW (ref 12.0–15.0)
MCH: 31.9 pg (ref 26.0–34.0)
MCHC: 31.1 g/dL (ref 30.0–36.0)
MCV: 102.7 fL — ABNORMAL HIGH (ref 80.0–100.0)
Platelets: 280 10*3/uL (ref 150–400)
RBC: 2.57 MIL/uL — ABNORMAL LOW (ref 3.87–5.11)
RDW: 16.4 % — ABNORMAL HIGH (ref 11.5–15.5)
WBC: 6.4 10*3/uL (ref 4.0–10.5)
nRBC: 0 % (ref 0.0–0.2)

## 2019-02-07 LAB — PROTIME-INR
INR: 1.4 — ABNORMAL HIGH (ref 0.8–1.2)
Prothrombin Time: 17.2 seconds — ABNORMAL HIGH (ref 11.4–15.2)

## 2019-02-07 LAB — SARS CORONAVIRUS 2 (TAT 6-24 HRS): SARS Coronavirus 2: NEGATIVE

## 2019-02-07 MED ORDER — NAPROXEN 250 MG PO TABS
250.0000 mg | ORAL_TABLET | Freq: Once | ORAL | Status: AC
  Administered 2019-02-07: 14:00:00 250 mg via ORAL
  Filled 2019-02-07: qty 1

## 2019-02-07 NOTE — Progress Notes (Addendum)
Patients blood pressure was 94/51 and had a scheduled lasix IV 20mg  for 0600. Messaged MD. Order from Dr. Sidney Ace to hold Lasix for the 0600 dose.

## 2019-02-07 NOTE — Progress Notes (Signed)
*  PRELIMINARY RESULTS* Echocardiogram 2D Echocardiogram has been performed.  Sherrie Sport 02/07/2019, 2:53 PM

## 2019-02-07 NOTE — Progress Notes (Signed)
[place] [facility] [place] NAME: Kytzia Hesterberg    MR#:  [id number]  DATE OF BIRTH:  03-15-1976  SUBJECTIVE:  CHIEF COMPLAINT:   Chief Complaint  Patient presents with   Leg Swelling   Right lower extremity swelling gradually improving.  Still has some edema on the left side but more on the right.  No fevers.  REVIEW OF SYSTEMS:  ROS CONSTITUTIONAL: No fever, have fatigue or weakness.  EYES: No blurred or double vision.  EARS, NOSE, AND THROAT: No tinnitus or ear pain.  RESPIRATORY: No cough, shortness of breath, wheezing or hemoptysis.  CARDIOVASCULAR: No chest pain, orthopnea, have bilateral leg edema.  GASTROINTESTINAL: No nausea, vomiting, diarrhea or abdominal pain.  GENITOURINARY: No dysuria, hematuria.  ENDOCRINE: No polyuria, nocturia,  HEMATOLOGY: No anemia, easy bruising or bleeding SKIN: No rash or lesion. MUSCULOSKELETAL: No joint pain or arthritis.   NEUROLOGIC: No tingling, numbness, weakness.  PSYCHIATRY: No anxiety or depression.   DRUG ALLERGIES:  No Known Allergies VITALS:  Blood pressure 107/66, pulse 95, temperature 99 F (37.2 C), temperature source Oral, resp. rate 18, height 5\' 4"  (1.626 m), weight 62.1 kg, SpO2 93 %. PHYSICAL EXAMINATION:  Physical Exam  GENERAL:  43 y.o.-year-old patient lying in the bed with acute distress and crying due to pain. EYES: Pupils equal, round, reactive to light and accommodation. No scleral icterus. Extraocular muscles intact.  HEENT: Head atraumatic, normocephalic. Oropharynx and nasopharynx clear.  NECK:  Supple, no jugular venous distention. No thyroid enlargement, no tenderness.  LUNGS: Normal breath sounds bilaterally, no wheezing, rales,rhonchi or crepitation. No use of accessory muscles of respiration.  CARDIOVASCULAR: S1, S2 normal. No murmurs, rubs, or gallops.  ABDOMEN: Soft, nontender, nondistended. Bowel sounds present. No organomegaly or mass.  EXTREMITIES:  Bilateral  lower extremity edema with right being greater than left.  no cyanosis, or clubbing.  Right leg has old surgical marks. Tenderness on both legs but no redness.  NEUROLOGIC: Generalized weakness.  Sensation intact. Gait not checked.  PSYCHIATRIC: The patient is alert and oriented x 3.  SKIN: No obvious rash, lesion, or ulcer.   LABORATORY PANEL:  Female CBC Recent Labs  Lab 02/07/19 0434  WBC 6.4  HGB 8.2*  HCT 26.4*  PLT 280   ------------------------------------------------------------------------------------------------------------------ Chemistries  Recent Labs  Lab 02/06/19 1107  NA 135  K 4.0  CL 103  CO2 25  GLUCOSE 89  BUN 6  CREATININE 0.38*  CALCIUM 8.1*  AST 86*  ALT 25  ALKPHOS 107  BILITOT 2.2*   RADIOLOGY:  US Venous Img Lower Bilateral  Result Date: 02/06/2019 CLINICAL DATA:  Bilateral lower extremity edema. EXAM: BILATERAL LOWER EXTREMITY VENOUS DOPPLER ULTRASOUND TECHNIQUE: Gray-scale sonography with graded compression, as well as color Doppler and duplex ultrasound were performed to evaluate the lower extremity deep venous systems from the level of the common femoral vein and including the common femoral, femoral, profunda femoral, popliteal and calf veins including the posterior tibial, peroneal and gastrocnemius veins when visible. The superficial great saphenous vein was also interrogated. Spectral Doppler was utilized to evaluate flow at rest and with distal augmentation maneuvers in the common femoral, femoral and popliteal veins. COMPARISON:  None. FINDINGS: RIGHT LOWER EXTREMITY Common Femoral Vein: No evidence of thrombus. Normal compressibility, respiratory phasicity and response to augmentation. Saphenofemoral Junction: No evidence of thrombus. Normal compressibility and flow on color Doppler imaging. Profunda Femoral Vein: No evidence of thrombus. Normal compressibility and flow on color  Doppler imaging. Femoral Vein: No evidence of thrombus. Normal  compressibility, respiratory phasicity and response to augmentation. Popliteal Vein: No evidence of thrombus. Normal compressibility, respiratory phasicity and response to augmentation. Calf Veins: No evidence of thrombus. Normal compressibility and flow on color Doppler imaging. Superficial Great Saphenous Vein: No evidence of thrombus. Normal compressibility. Venous Reflux:  None. Other Findings: No evidence of superficial thrombophlebitis. Irregular complex fluid present in the right popliteal fossa appears to communicate with the joint. Part of this fluid may be consistent with a Baker's cyst but there is a deep component likely communicating with the joint. Correlation suggested with any clinical evidence of right knee joint disease. LEFT LOWER EXTREMITY Common Femoral Vein: No evidence of thrombus. Normal compressibility, respiratory phasicity and response to augmentation. Saphenofemoral Junction: No evidence of thrombus. Normal compressibility and flow on color Doppler imaging. Profunda Femoral Vein: No evidence of thrombus. Normal compressibility and flow on color Doppler imaging. Femoral Vein: No evidence of thrombus. Normal compressibility, respiratory phasicity and response to augmentation. Popliteal Vein: No evidence of thrombus. Normal compressibility, respiratory phasicity and response to augmentation. Calf Veins: No evidence of thrombus. Normal compressibility and flow on color Doppler imaging. Superficial Great Saphenous Vein: No evidence of thrombus. Normal compressibility. Venous Reflux:  None. Other Findings: No evidence of superficial thrombophlebitis or abnormal fluid collection. IMPRESSION: 1. No evidence of deep venous thrombosis in either lower extremity. 2. Irregular complex fluid in the right popliteal fossa appears to likely communicate with the joint space of the right knee. Correlation suggested with clinical evidence of right knee joint disease. Electronically Signed   By: Aletta Edouard M.D.   On: 02/06/2019 16:44   ASSESSMENT AND PLAN:   *Bilateral leg edema with pain No redness.  Currently does not appear to be infected. She has old injuries on the right leg with surgical marks and some deformity in feet.  Reports prior history of torn acromioclavicular ligament. Denies any new injury. Patient placed on IV Lasix.  Venous Doppler ultrasound done with no DVT. 2D echocardiogram requested to evaluate cardiac function. Most likely this could be due to her liver cirrhosis. Pain management as needed.  *Liver cirrhosis Hepatitis C Continue to monitor Currently she is saying she quit drinking alcohol 2 weeks ago.  *Gastroesophageal reflux disease Continue pantoprazole.  *Active smoking Counseled to quit smoking for 4 minutes and offered nicotine patch  DVT prophylaxis; heparin   All the records are reviewed and case discussed with Care Management/Social Worker. Management plans discussed with the patient, family and they are in agreement.  CODE STATUS: Full Code  TOTAL TIME TAKING CARE OF THIS PATIENT: 37 minutes.   More than 50% of the time was spent in counseling/coordination of care: YES  POSSIBLE D/C IN 2 DAYS, DEPENDING ON CLINICAL CONDITION.   Marcelo Ickes M.D on 02/07/2019 at 1:48 PM  Between 7am to 6pm - Pager - [phone]  After 6pm go to [email or web address] - password [email or web address] [facility]  [employer]  Hospitalists  Office  [phone]  CC: Primary care physician; Volney American, PA-C  Note: This dictation was prepared with Dragon dictation along with smaller phrase technology. Any transcriptional errors that result from this process are unintentional.

## 2019-02-08 LAB — CBC
HCT: 26.6 % — ABNORMAL LOW (ref 36.0–46.0)
Hemoglobin: 8.6 g/dL — ABNORMAL LOW (ref 12.0–15.0)
MCH: 32.1 pg (ref 26.0–34.0)
MCHC: 32.3 g/dL (ref 30.0–36.0)
MCV: 99.3 fL (ref 80.0–100.0)
Platelets: 277 10*3/uL (ref 150–400)
RBC: 2.68 MIL/uL — ABNORMAL LOW (ref 3.87–5.11)
RDW: 16.3 % — ABNORMAL HIGH (ref 11.5–15.5)
WBC: 5.4 10*3/uL (ref 4.0–10.5)
nRBC: 0 % (ref 0.0–0.2)

## 2019-02-08 LAB — MAGNESIUM: Magnesium: 1.7 mg/dL (ref 1.7–2.4)

## 2019-02-08 LAB — BASIC METABOLIC PANEL
Anion gap: 7 (ref 5–15)
BUN: <5 mg/dL — ABNORMAL LOW (ref 6–20)
CO2: 25 mmol/L (ref 22–32)
Calcium: 7.6 mg/dL — ABNORMAL LOW (ref 8.9–10.3)
Chloride: 107 mmol/L (ref 98–111)
Creatinine, Ser: 0.35 mg/dL — ABNORMAL LOW (ref 0.44–1.00)
GFR calc Af Amer: >60 mL/min (ref >60)
GFR calc non Af Amer: >60 mL/min (ref >60)
Glucose, Bld: 84 mg/dL (ref 70–99)
Potassium: 3 mmol/L — ABNORMAL LOW (ref 3.5–5.1)
Sodium: 139 mmol/L (ref 135–145)

## 2019-02-08 MED ORDER — FUROSEMIDE 40 MG PO TABS: 40.0000 mg | ORAL_TABLET | Freq: Every day | ORAL | 0 refills | Status: DC

## 2019-02-08 MED ORDER — FUROSEMIDE 10 MG/ML IJ SOLN
20.0000 mg | Freq: Once | INTRAMUSCULAR | Status: DC
  Filled 2019-02-08: qty 2

## 2019-02-08 MED ORDER — POTASSIUM CHLORIDE CRYS ER 20 MEQ PO TBCR
40.0000 meq | EXTENDED_RELEASE_TABLET | Freq: Once | ORAL | Status: AC
  Administered 2019-02-08: 10:00:00 40 meq via ORAL
  Filled 2019-02-08: qty 2

## 2019-02-08 NOTE — Progress Notes (Addendum)
Patient refused to have her heparin injection and her Lasix this morning. Messaged MD and he said to have the Lasix rescheduled for later and her BP was too low as well it was 105/58.

## 2019-02-08 NOTE — Discharge Summary (Signed)
Rhonda [facility] at [place] NAME: Rhonda Martin    MR#:  [id number]  DATE OF BIRTH:  11-29-1975  DATE OF ADMISSION:  02/06/2019   ADMITTING PHYSICIAN: Vaughan Basta, MD  DATE OF DISCHARGE: 02/08/2019  PRIMARY CARE PHYSICIAN: Volney American, PA-C   ADMISSION DIAGNOSIS:  Bilateral leg edema [R60.0] Bilateral lower extremity edema [R60.0] Cirrhosis of liver without ascites, unspecified hepatic cirrhosis type ([place]) [K74.60] DISCHARGE DIAGNOSIS:  Principal Problem:   Bilateral leg edema  SECONDARY DIAGNOSIS:   Past Medical History:  Diagnosis Date   Back injury    Cervical cancer ([place])    Charcot-Marie-Tooth disease    Hepatitis    Hypertension    Leg injury    Liver cirrhosis [name])    HOSPITAL COURSE:   Chief complaint; lower extremity swelling  History of presenting complaint; Rhonda Martin  is a 43 y.o. female with a known history of cervical cancer, hepatitis C, liver cirrhosis, alcoholism-was admitted to hospital with alcohol withdrawal 2 weeks prior to this admission.  She had some old injury on both legs as a child and surgeries and in her legs.  Patient was admitted for further management of bilateral lower extremity edema.  Hospital course; *Bilateral leg edema with pain No redness. Currently does not appear to be infected.  Was diuresed with IV Lasix and significantly improved.  No fevers. She has old injuries on the right leg with surgical marks and some deformity in feet.  Reports prior history of torn acromioclavicular ligament.  Patient follows up with orthopedic physician as outpatient.  Already has an appointment as outpatient.  Venous Doppler ultrasound done with no DVT.  2D echocardiogram done with normal ejection fraction of 55 to 60%.  Patient reports she is able to ambulate independently.  Offered physical therapy consult but patient declined and stated she has lots of help at home.  Clinically and  hemodynamically stable.  Prescription for p.o. Lasix given.  Follow-up with primary care physician.   Bilateral lower extremity edema most likely due to underlying liver cirrhosis with hypoalbuminemia.    *Liver cirrhosis Hepatitis C Continue to monitor Currently she is saying she quit drinking alcohol 2 weeks ago.  *Gastroesophageal reflux disease Continue pantoprazole.  *Active smoking Counseled to quit smoking for 4 minutes and offered nicotine patch  DISCHARGE CONDITIONS:  Stable CONSULTS OBTAINED:   DRUG ALLERGIES:  No Known Allergies DISCHARGE MEDICATIONS:   Allergies as of 02/08/2019   No Known Allergies     Medication List    TAKE these medications   chlordiazePOXIDE 25 MG capsule Commonly known as: LIBRIUM Take 1 capsule (25 mg total) by mouth 4 (four) times daily as needed for withdrawal (for CIWA >10).   folic acid 1 MG tablet Commonly known as: FOLVITE Take 1 tablet (1 mg total) by mouth daily.   furosemide 40 MG tablet Commonly known as: Lasix Take 1 tablet (40 mg total) by mouth daily.   LORazepam 0.5 MG tablet Commonly known as: ATIVAN Take 0.5 mg by mouth every 8 (eight) hours as needed for anxiety.   multivitamin with minerals Tabs tablet Take 1 tablet by mouth daily.   naproxen sodium 220 MG tablet Commonly known as: ALEVE Take 220 mg by mouth daily as needed.   pantoprazole 40 MG tablet Commonly known as: PROTONIX Take 1 tablet (40 mg total) by mouth daily.   PARoxetine 12.5 MG 24 hr tablet Commonly known as: PAXIL-CR Take 1 tablet (12.5 mg  total) by mouth daily.   sucralfate 1 g tablet Commonly known as: Carafate Take 1 tablet (1 g total) by mouth 4 (four) times daily -  with meals and at bedtime.   thiamine 100 MG tablet Take 1 tablet (100 mg total) by mouth daily.   traMADol 50 MG tablet Commonly known as: ULTRAM Take 1 tablet (50 mg total) by mouth every 6 (six) hours as needed for moderate pain.        DISCHARGE  INSTRUCTIONS:   DIET:  Low-sodium diet DISCHARGE CONDITION:  Stable ACTIVITY:  Activity as tolerated OXYGEN:  Home Oxygen: No.  Oxygen Delivery: room air DISCHARGE LOCATION:  home   If you experience worsening of your admission symptoms, develop shortness of breath, life threatening emergency, suicidal or homicidal thoughts you must seek medical attention immediately by calling 911 or calling your MD immediately  if symptoms less severe.  You Must read complete instructions/literature along with all the possible adverse reactions/side effects for all the Medicines you take and that have been prescribed to you. Take any new Medicines after you have completely understood and accpet all the possible adverse reactions/side effects.   Please note  You were cared for by a hospitalist during your hospital stay. If you have any questions about your discharge medications or the care you received while you were in the hospital after you are discharged, you can call the unit and asked to speak with the hospitalist on call if the hospitalist that took care of you is not available. Once you are discharged, your primary care physician will handle any further medical issues. Please note that NO REFILLS for any discharge medications will be authorized once you are discharged, as it is imperative that you return to your primary care physician (or establish a relationship with a primary care physician if you do not have one) for your aftercare needs so that they can reassess your need for medications and monitor your lab values.    On the day of Discharge:  VITAL SIGNS:  Blood pressure 103/65, pulse 76, temperature 98.5 F (36.9 C), temperature source Oral, resp. rate 18, height 5\' 4"  (1.626 m), weight 62.1 kg, SpO2 94 %. PHYSICAL EXAMINATION:  GENERAL:  43 y.o.-year-old patient lying in the bed with no acute distress.  EYES: Pupils equal, round, reactive to light and accommodation. No scleral icterus.  Extraocular muscles intact.  HEENT: Head atraumatic, normocephalic. Oropharynx and nasopharynx clear.  NECK:  Supple, no jugular venous distention. No thyroid enlargement, no tenderness.  LUNGS: Normal breath sounds bilaterally, no wheezing, rales,rhonchi or crepitation. No use of accessory muscles of respiration.  CARDIOVASCULAR: S1, S2 normal. No murmurs, rubs, or gallops.  ABDOMEN: Soft, non-tender, non-distended. Bowel sounds present. No organomegaly or mass.  EXTREMITIES: Improvement in bilateral lower extremity edema.  No redness or tenderness over knee joint. NEUROLOGIC: Cranial nerves II through XII are intact. Muscle strength 5/5 in all extremities. Sensation intact. Gait not checked.  PSYCHIATRIC: The patient is alert and oriented x 3.  SKIN: No obvious rash, lesion, or ulcer.  DATA REVIEW:   CBC Recent Labs  Lab 02/08/19 0435  WBC 5.4  HGB 8.6*  HCT 26.6*  PLT 277    Chemistries  Recent Labs  Lab 02/06/19 1107 02/08/19 0435  NA 135 139  K 4.0 3.0*  CL 103 107  CO2 25 25  GLUCOSE 89 84  BUN 6 <5*  CREATININE 0.38* 0.35*  CALCIUM 8.1* 7.6*  MG  --  1.7  AST 86*  --   ALT 25  --   ALKPHOS 107  --   BILITOT 2.2*  --      Microbiology Results  Results for orders placed or performed during the hospital encounter of 02/06/19  SARS CORONAVIRUS 2 (TAT 6-24 HRS) Nasopharyngeal Nasopharyngeal Swab     Status: None   Collection Time: 02/06/19  2:23 PM   Specimen: Nasopharyngeal Swab  Result Value Ref Range Status   SARS Coronavirus 2 NEGATIVE NEGATIVE Final    Comment: (NOTE) SARS-CoV-2 target nucleic acids are NOT DETECTED. The SARS-CoV-2 RNA is generally detectable in upper and lower respiratory specimens during the acute phase of infection. Negative results do not preclude SARS-CoV-2 infection, do not rule out co-infections with other pathogens, and should not be used as the sole basis for treatment or other patient management decisions. Negative results  must be combined with clinical observations, patient history, and epidemiological information. The expected result is Negative. Fact Sheet for Patients: SugarRoll.be Fact Sheet for Healthcare Providers: https://www.woods-mathews.com/ This test is not yet approved or cleared by the [place] FDA and  has been authorized for detection and/or diagnosis of SARS-CoV-2 by FDA under an Emergency Use Authorization (EUA). This EUA will remain  in effect (meaning this test can be used) for the duration of the COVID-19 declaration under Section 56 4(b)(1) of the Act, 21 U.S.C. section 360bbb-3(b)(1), unless the authorization is terminated or revoked sooner. Performed at [facility], [place] [address], [place], [place] [address]     RADIOLOGY:  No results found.   Management plans discussed with the patient, family and they are in agreement.  CODE STATUS: Full Code   TOTAL TIME TAKING CARE OF THIS PATIENT: 37 minutes.    Hudson Majkowski M.D on 02/08/2019 at 1:57 PM  Between 7am to 6pm - Pager - [phone]  After 6pm go to [email or web address] - password [email or web address] [facility]  [employer] [place] Hospitalists  Office  [phone]  CC: Primary care physician; Volney American, PA-C   Note: This dictation was prepared with Dragon dictation along with smaller phrase technology. Any transcriptional errors that result from this process are unintentional.

## 2019-02-08 NOTE — Progress Notes (Signed)
Received MD order to discharge patient to home, reviewed home meds, prescriptions discharge instructions and follow up appointments with patient and patient verbalized understanding

## 2019-02-09 ENCOUNTER — Telehealth: Payer: Self-pay

## 2019-02-09 NOTE — Telephone Encounter (Signed)
I have made the 1st attempt to contact the patient or family member in charge, in order to follow up from recently being discharged from the hospital. I was unable to leave a message on voicemail but I will make another attempt at a different time.  

## 2019-02-09 NOTE — Telephone Encounter (Signed)
I have made the 2nd attempt to contact the patient or family member in charge, in order to follow up from recently being discharged from the hospital.  

## 2019-02-09 NOTE — Telephone Encounter (Signed)
Transition Care Management Follow-up Telephone Call  Date of discharge and from where: 02/08/2019 at [facility]   How have you been since you were released from the hospital? "feeling a heck of a lot of a lot better than I was,  at least can walk now"  Any questions or concerns? Yes  wanted to discuss medication at appt   Items Reviewed:  Did the pt receive and understand the discharge instructions provided? Yes   Medications obtained and verified? Yes   Any new allergies since your discharge? No   Dietary orders reviewed? Yes  Do you have support at home? Yes   Functional Questionnaire: (I = Independent and D = Dependent) ADLs: I   Bathing/Dressing- I   Meal Prep- i  Eating- i  Maintaining continence- I   Transferring/Ambulation- has walker if needed  Managing Meds- i  Follow up appointments reviewed:   PCP Hospital f/u appt confirmed? Yes  Scheduled to see Wynona Dove on 02/10/2019 @ [facility] f/u appt confirmed? Yes    Are transportation arrangements needed? No   If their condition worsens, is the pt aware to call PCP or go to the Emergency Dept.? Yes  Was the patient provided with contact information for the PCP's office or ED? Yes  Was to pt encouraged to call back with questions or concerns? Yes

## 2019-02-10 ENCOUNTER — Inpatient Hospital Stay: Payer: Medicaid Other | Admitting: Family Medicine

## 2019-02-16 ENCOUNTER — Ambulatory Visit: Payer: Medicaid Other | Admitting: Family Medicine

## 2019-02-16 ENCOUNTER — Encounter: Payer: Self-pay | Admitting: Family Medicine

## 2019-02-16 ENCOUNTER — Other Ambulatory Visit: Payer: Self-pay

## 2019-02-16 ENCOUNTER — Telehealth: Payer: Self-pay

## 2019-02-16 VITALS — BP 92/65 | HR 111 | Temp 98.5°F | Ht 61.5 in | Wt 128.0 lb

## 2019-02-16 DIAGNOSIS — R600 Localized edema: Secondary | ICD-10-CM

## 2019-02-16 DIAGNOSIS — I959 Hypotension, unspecified: Secondary | ICD-10-CM

## 2019-02-16 DIAGNOSIS — G600 Hereditary motor and sensory neuropathy: Secondary | ICD-10-CM | POA: Diagnosis not present

## 2019-02-16 DIAGNOSIS — F418 Other specified anxiety disorders: Secondary | ICD-10-CM

## 2019-02-16 DIAGNOSIS — M25561 Pain in right knee: Secondary | ICD-10-CM

## 2019-02-16 DIAGNOSIS — F1010 Alcohol abuse, uncomplicated: Secondary | ICD-10-CM

## 2019-02-16 DIAGNOSIS — B182 Chronic viral hepatitis C: Secondary | ICD-10-CM

## 2019-02-16 DIAGNOSIS — G8929 Other chronic pain: Secondary | ICD-10-CM

## 2019-02-16 MED ORDER — HYDROCODONE-ACETAMINOPHEN 5-325 MG PO TABS: 1.0000 | ORAL_TABLET | Freq: Four times a day (QID) | ORAL | 0 refills | Status: DC | PRN

## 2019-02-16 MED ORDER — CHLORTHALIDONE 25 MG PO TABS: 25.0000 mg | ORAL_TABLET | Freq: Every day | ORAL | 1 refills | Status: DC

## 2019-02-16 MED ORDER — HYDROXYZINE HCL 25 MG PO TABS: 25.0000 mg | ORAL_TABLET | Freq: Three times a day (TID) | ORAL | 1 refills | Status: DC | PRN

## 2019-02-16 NOTE — Telephone Encounter (Signed)
Called pt to inform her of U/S result and Dr. Georgeann Oppenheim recommendations.  Unable to contact, No VM set up Will send MyChart message

## 2019-02-16 NOTE — Telephone Encounter (Signed)
-----   Message from Jonathon Bellows, MD sent at 02/09/2019 12:22 PM EDT ----- Rhonda Martin   Please inform patient that based on the elastography result she probably has cirrhosis of the liver.  I strongly suggest her to completely stop drinking all alcohol as discussed at the last office visit and seek help from alcoholic anonymous.  I would also suggest we perform an upper endoscopy to screen for esophageal varices.  Please explained to her that bleeding from esophageal varices carries a 50% mortality.  If she would like to discuss this further please schedule an office visit or telephone visit.  C/c Volney American, PA-C   Dr Jonathon Bellows MD,MRCP [facility]) Gastroenterology/Hepatology Pager: [phone]

## 2019-02-16 NOTE — Progress Notes (Signed)
BP 92/65   Pulse (!) 111   Temp 98.5 F (36.9 C) (Oral)   Ht 5' 1.5" (1.562 m)   Wt 128 lb (58.1 kg)   SpO2 96%   BMI 23.79 kg/m    Subjective:    [name], female    DOB: 1975/07/08, 43 y.o.   MRN: [id number]  HPI: Rhonda Martin is a 43 y.o. female  Chief Complaint  Patient presents with  . Hospitalization Follow-up    pt would like to go back to ativan and librium   Here today for hospital f/u for b/l leg edema and liver cirrhosis. Recently diagnosed with hepatitis C with cirrhosis but has not yet been able to start treatment due to her alcoholism. Currently 3 weeks sober after admission for alcohol withdrawals, hoping to be able to get treated soon. Was diuresed with IV lasix during admission with some improvement. Normal echocardiogram and venous doppler u/s's of LEs during admission per reports reviewed on epic. 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 She was unable to get in with [facility] earlier this year due to an insurance issue and would like a new referral to be placed.   Did not start her gabapentin until end of August because she was concerned about stomach irritation. Currently taking tramadol and gabapentin which don't help much but causes significant diarrhea and abdominal pain.   PO lasix that she was discharged home on not helping with her LE edema. Has been watching diet, wearing ace bandages on legs, using soaks, ice, heat, massage and nothing is working. Pain medicine isn't helping, she can't sleep and cries often because of the pain.   Moods have been suffering with all of this pain. Trazodone, cymbalta, seroquel, zoloft, and many other medications in the past have not helped with her anxiety or depressed moods. Vistaril has helped in the past. Did get some ativan from hospital which helps at times but she is out and does  not necessarily want to continue on a controlled substance. Denies SI/HI.   Depression screen [facility] 2/9 11/30/2018  Decreased Interest 3  Down, Depressed, Hopeless 1  PHQ - 2 Score 4  Altered sleeping 3  Tired, decreased energy 3  Change in appetite 3  Feeling bad or failure about yourself  0  Trouble concentrating 1  Moving slowly or fidgety/restless 0  Suicidal thoughts 0  PHQ-9 Score 14   GAD 7 : Generalized Anxiety Score 11/30/2018  Nervous, Anxious, on Edge 3  Control/stop worrying 3  Worry too much - different things 3  Trouble relaxing 3  Restless 3  Easily annoyed or irritable 0  Afraid - awful might happen 3  Total GAD 7 Score 18   Relevant past medical, surgical, family and social history reviewed and updated as indicated. Interim medical history since our last visit reviewed. Allergies and medications reviewed and updated.  Review of Systems  Per HPI unless specifically indicated above     Objective:    BP 92/65   Pulse (!) 111   Temp 98.5 F (36.9 C) (Oral)   Ht 5' 1.5" (1.562 m)   Wt 128 lb (58.1 kg)   SpO2 96%   BMI 23.79 kg/m   Wt Readings from Last 3 Encounters:  02/16/19 128 lb (58.1 kg)  02/06/19 136 lb 14.4 oz (62.1 kg)  01/27/19 120 lb 13 oz (54.8 kg)    Physical Exam Vitals signs and nursing note reviewed.  Constitutional:      Appearance: She is ill-appearing.     Comments: Appears chronically ill and in pain  HENT:     Head: Atraumatic.  Eyes:     Extraocular Movements: Extraocular movements intact.     Conjunctiva/sclera: Conjunctivae normal.  Neck:     Musculoskeletal: Normal range of motion and neck supple.  Cardiovascular:     Rate and Rhythm: Normal rate and regular rhythm.     Heart sounds: Normal heart sounds.  Pulmonary:     Effort: Pulmonary effort is normal.     Breath sounds: Normal breath sounds.  Abdominal:     General: Bowel sounds are normal.  Musculoskeletal:     Comments: Antalgic gait B/l feet and into ankles  2+ pitting edema and mild erythema. Significantly ttp  Skin:    General: Skin is warm and dry.  Neurological:     Mental Status: She is alert. Mental status is at baseline.  Psychiatric:        Mood and Affect: Mood normal.        Thought Content: Thought content normal.        Judgment: Judgment normal.     Comments: tearful     Results for orders placed or performed during the hospital encounter of 02/06/19  SARS CORONAVIRUS 2 (TAT 6-24 HRS) Nasopharyngeal Nasopharyngeal Swab   Specimen: Nasopharyngeal Swab  Result Value Ref Range   SARS Coronavirus 2 NEGATIVE NEGATIVE  Basic metabolic panel  Result Value Ref Range   Sodium 135 135 - 145 mmol/L   Potassium 4.0 3.5 - 5.1 mmol/L   Chloride 103 98 - 111 mmol/L   CO2 25 22 - 32 mmol/L   Glucose, Bld 89 70 - 99 mg/dL   BUN 6 6 - 20 mg/dL   Creatinine, Ser 0.38 (L) 0.44 - 1.00 mg/dL   Calcium 8.1 (L) 8.9 - 10.3 mg/dL   GFR calc non Af Amer >60 >60 mL/min   GFR calc Af Amer >60 >60 mL/min   Anion gap 7 5 - 15  CBC  Result Value Ref Range   WBC 6.4 4.0 - 10.5 K/uL   RBC 3.05 (L) 3.87 - 5.11 MIL/uL   Hemoglobin 10.0 (L) 12.0 - 15.0 g/dL   HCT 31.6 (L) 36.0 - 46.0 %   MCV 103.6 (H) 80.0 - 100.0 fL   MCH 32.8 26.0 - 34.0 pg   MCHC 31.6 30.0 - 36.0 g/dL   RDW 16.3 (H) 11.5 - 15.5 %   Platelets 285 150 - 400 K/uL   nRBC 0.0 0.0 - 0.2 %  Urinalysis, Complete w Microscopic  Result Value Ref Range   Color, Urine YELLOW (A) YELLOW   APPearance CLEAR (A) CLEAR   Specific Gravity, Urine 1.006 1.005 - 1.030   pH 6.0 5.0 - 8.0   Glucose, UA NEGATIVE NEGATIVE mg/dL   Hgb urine dipstick NEGATIVE NEGATIVE   Bilirubin Urine NEGATIVE NEGATIVE   Ketones, ur NEGATIVE NEGATIVE mg/dL   Protein, ur NEGATIVE NEGATIVE mg/dL   Nitrite NEGATIVE NEGATIVE   Leukocytes,Ua NEGATIVE NEGATIVE   RBC / HPF 0-5 0 - 5 RBC/hpf   WBC, UA 0-5 0 - 5 WBC/hpf   Bacteria, UA RARE (A) NONE SEEN   Squamous Epithelial / LPF 0-5 0 - 5  Glucose, capillary   Result Value Ref Range   Glucose-Capillary 82 70 -  99 mg/dL  Ethanol  Result Value Ref Range   Alcohol, Ethyl (B) <10 <10 mg/dL  Urine Drug Screen, Qualitative ([facility] only)  Result Value Ref Range   Tricyclic, Ur Screen NONE DETECTED NONE DETECTED   Amphetamines, Ur Screen NONE DETECTED NONE DETECTED   MDMA (Ecstasy)Ur Screen NONE DETECTED NONE DETECTED   Cocaine Metabolite,Ur [place] NONE DETECTED NONE DETECTED   Opiate, Ur Screen POSITIVE (A) NONE DETECTED   Phencyclidine (PCP) Ur S NONE DETECTED NONE DETECTED   Cannabinoid 50 Ng, Ur [place] NONE DETECTED NONE DETECTED   Barbiturates, Ur Screen NONE DETECTED NONE DETECTED   Benzodiazepine, Ur Scrn POSITIVE (A) NONE DETECTED   Methadone Scn, Ur NONE DETECTED NONE DETECTED  Hepatic function panel  Result Value Ref Range   Total Protein 6.8 6.5 - 8.1 g/dL   Albumin 2.5 (L) 3.5 - 5.0 g/dL   AST 86 (H) 15 - 41 U/L   ALT 25 0 - 44 U/L   Alkaline Phosphatase 107 38 - 126 U/L   Total Bilirubin 2.2 (H) 0.3 - 1.2 mg/dL   Bilirubin, Direct 1.1 (H) 0.0 - 0.2 mg/dL   Indirect Bilirubin 1.1 (H) 0.3 - 0.9 mg/dL  Protime-INR  Result Value Ref Range   Prothrombin Time 17.2 (H) 11.4 - 15.2 seconds   INR 1.4 (H) 0.8 - 1.2  CBC  Result Value Ref Range   WBC 6.4 4.0 - 10.5 K/uL   RBC 2.57 (L) 3.87 - 5.11 MIL/uL   Hemoglobin 8.2 (L) 12.0 - 15.0 g/dL   HCT 26.4 (L) 36.0 - 46.0 %   MCV 102.7 (H) 80.0 - 100.0 fL   MCH 31.9 26.0 - 34.0 pg   MCHC 31.1 30.0 - 36.0 g/dL   RDW 16.4 (H) 11.5 - 15.5 %   Platelets 280 150 - 400 K/uL   nRBC 0.0 0.0 - 0.2 %  Basic metabolic panel  Result Value Ref Range   Sodium 139 135 - 145 mmol/L   Potassium 3.0 (L) 3.5 - 5.1 mmol/L   Chloride 107 98 - 111 mmol/L   CO2 25 22 - 32 mmol/L   Glucose, Bld 84 70 - 99 mg/dL   BUN <5 (L) 6 - 20 mg/dL   Creatinine, Ser 0.35 (L) 0.44 - 1.00 mg/dL   Calcium 7.6 (L) 8.9 - 10.3 mg/dL   GFR calc non Af Amer >60 >60 mL/min   GFR calc Af Amer >60 >60 mL/min   Anion gap 7 5 - 15   CBC  Result Value Ref Range   WBC 5.4 4.0 - 10.5 K/uL   RBC 2.68 (L) 3.87 - 5.11 MIL/uL   Hemoglobin 8.6 (L) 12.0 - 15.0 g/dL   HCT 26.6 (L) 36.0 - 46.0 %   MCV 99.3 80.0 - 100.0 fL   MCH 32.1 26.0 - 34.0 pg   MCHC 32.3 30.0 - 36.0 g/dL   RDW 16.3 (H) 11.5 - 15.5 %   Platelets 277 150 - 400 K/uL   nRBC 0.0 0.0 - 0.2 %  Magnesium  Result Value Ref Range   Magnesium 1.7 1.7 - 2.4 mg/dL  ECHOCARDIOGRAM COMPLETE  Result Value Ref Range   Weight 2,190.4 oz   Height 64 in   BP 107/66 mmHg      Assessment & Plan:   Problem List Items Addressed This Visit      Digestive   Chronic hepatitis C without hepatic coma ([place])    Following with GI, who is working with her  regarding future treatment once her alcoholism is in remission        Nervous and Auditory   Charcot-Marie-Tooth disease    Referral to pain management placed      Relevant Medications   gabapentin (NEURONTIN) 300 MG capsule   hydrOXYzine (ATARAX/VISTARIL) 25 MG tablet   Other Relevant Orders   Ambulatory referral to Pain Clinic     Other   Alcohol abuse    3 weeks sober per patient, congratulated success. Patient trying hard to stay sober so she can undergo tx for her hepatitis C      Anxious depression    Discussed dangers of Benzos when on pain medications, will give vistaril for prn use. Declines trying new preventatives as she's failed so many things in the past.       Relevant Medications   hydrOXYzine (ATARAX/VISTARIL) 25 MG tablet   Bilateral leg edema - Primary    Likely secondary to liver cirrhosis from alcoholism and hepatitis C. Will not increase lasix at the moment because her BPs are soft and she's having orthostatic sxs. Will recheck electrolytes and renal function today. Patient states she tolerated chlorthalidone much better than lasix during admission and wishes to switch back over to that. Compression, leg elevation, sodium control. Will place urgent referral to pain management for  discussion about pain control and cautiously prescribe hydrocodone small supply for prn use for severe pain to bridge her given this acute issue. Strict addiction and mis-use precautions reviewed. Controlled substance database reviewed      Relevant Orders   Ambulatory referral to Pain Clinic   Basic metabolic panel    Other Visit Diagnoses    Chronic pain of right knee       Re-placing referral to [place] [facility] per patient request for management   Relevant Medications   gabapentin (NEURONTIN) 300 MG capsule   HYDROcodone-acetaminophen (NORCO/VICODIN) 5-325 MG tablet   Other Relevant Orders   AMB referral to orthopedics   Hypotension, unspecified hypotension type       Will provide home BP monitor given soft BPs d/t diuretics. Push fluids, call if BPs too low or feeling dizzy   Relevant Medications   chlorthalidone (HYGROTON) 25 MG tablet      Greater than 45 minutes spent today in direct care and coordination with patient. Her fiance was present for visit as he is primary caregiver helping her with her chronic conditions.  Follow up plan: Return in about 2 weeks (around 03/02/2019) for edema f/u.

## 2019-02-17 NOTE — Assessment & Plan Note (Signed)
Referral to pain management placed.

## 2019-02-17 NOTE — Assessment & Plan Note (Signed)
3 weeks sober per patient, congratulated success. Patient trying hard to stay sober so she can undergo tx for her hepatitis C

## 2019-02-17 NOTE — Assessment & Plan Note (Addendum)
Likely secondary to liver cirrhosis from alcoholism and hepatitis C. Will not increase lasix at the moment because her BPs are soft and she's having orthostatic sxs. Will recheck electrolytes and renal function today. Patient states she tolerated chlorthalidone much better than lasix during admission and wishes to switch back over to that. Compression, leg elevation, sodium control. Will place urgent referral to pain management for discussion about pain control and cautiously prescribe hydrocodone small supply for prn use for severe pain to bridge her given this acute issue. Strict addiction and mis-use precautions reviewed. Controlled substance database reviewed

## 2019-02-17 NOTE — Assessment & Plan Note (Signed)
Discussed dangers of Benzos when on pain medications, will give vistaril for prn use. Declines trying new preventatives as she's failed so many things in the past.

## 2019-02-17 NOTE — Assessment & Plan Note (Signed)
Following with GI, who is working with her regarding future treatment once her alcoholism is in remission

## 2019-02-20 ENCOUNTER — Telehealth: Payer: Self-pay | Admitting: Family Medicine

## 2019-02-20 ENCOUNTER — Encounter: Payer: Self-pay | Admitting: Family Medicine

## 2019-02-20 NOTE — Telephone Encounter (Signed)
Routing to provider  

## 2019-02-20 NOTE — Telephone Encounter (Signed)
Please let her know that I did not prescribe the paxil, we had discussed and decided on the hydroxyzine which is what I had sent in. Is she taking that? The referral to Orthopedics has been re-submitted per our discussion as well and is awaiting approval. Both orthopedics and pain management should be reaching out very soon to her.   Visit Follow-Up Question  Georgina Peer, CMA routed conversation to You 2 hours ago (2:40 PM)    Gearlene, Deasy  You 2 hours ago (2:18 PM)     For some reason, when you set up my appointment referral for [place], they said that Medicaid denied my referral. I don't know if it was just because my Medicaid had not transferred up here yet, or if it was just cut backs with the system, but I desperately need another referral either sent to them or either a local doctor. Also the Medicaid has not approved the Paxil you prescribed me. They are saying that I need to have prior approval, but it has been quite since you did that prescription. I'm not sure what to do about it all honestly. No offense, but I wish that I didn't have to come see any doctors at all. If we could try something else so I can actually get some rest and have a chance to heal, that would be wonderful. Thank you so much for being a wonderful doctor at least. I can truthfully say that since I started going to [employer] as a child, I think you're the best. Have a good day hon.  Stanton Kidney

## 2019-02-20 NOTE — Telephone Encounter (Signed)
Copied from [place] [id number]. Topic: General - Other >> Feb 20, 2019  1:38 PM Keene Breath wrote: Reason for CRM: Patient called to ask the doctor to send her in some more pain medication until her referral goes through.  She stated that she is in a lot of pain and needs something as soon as possible.  CB# [phone]

## 2019-02-21 MED ORDER — HYDROCODONE-ACETAMINOPHEN 5-325 MG PO TABS: 1.0000 | ORAL_TABLET | Freq: Four times a day (QID) | ORAL | 0 refills | Status: DC | PRN

## 2019-02-21 NOTE — Telephone Encounter (Signed)
Patient notified

## 2019-02-21 NOTE — Telephone Encounter (Signed)
Medication refilled, will need to make this last until she can get in with Pain Management

## 2019-03-06 ENCOUNTER — Ambulatory Visit: Payer: Medicaid Other | Admitting: Family Medicine

## 2019-03-08 ENCOUNTER — Ambulatory Visit: Payer: Medicaid Other | Admitting: Family Medicine

## 2019-03-09 NOTE — Telephone Encounter (Signed)
Tried to call patient but voicemail is not set up. Sent mychart message  

## 2019-03-15 ENCOUNTER — Other Ambulatory Visit: Payer: Self-pay | Admitting: Orthopedic Surgery

## 2019-03-15 DIAGNOSIS — G8929 Other chronic pain: Secondary | ICD-10-CM

## 2019-03-15 DIAGNOSIS — M21061 Valgus deformity, not elsewhere classified, right knee: Secondary | ICD-10-CM

## 2019-03-15 DIAGNOSIS — M1711 Unilateral primary osteoarthritis, right knee: Secondary | ICD-10-CM

## 2019-03-22 ENCOUNTER — Ambulatory Visit: Payer: Medicaid Other

## 2019-03-24 ENCOUNTER — Other Ambulatory Visit: Payer: Self-pay

## 2019-03-24 ENCOUNTER — Ambulatory Visit
Admission: RE | Admit: 2019-03-24 | Discharge: 2019-03-24 | Disposition: A | Discharge status: Home / Self Care | Payer: Medicaid Other | Source: Ambulatory Visit | Attending: Orthopedic Surgery | Admitting: Orthopedic Surgery

## 2019-03-24 DIAGNOSIS — M25561 Pain in right knee: Secondary | ICD-10-CM | POA: Insufficient documentation

## 2019-03-24 DIAGNOSIS — G8929 Other chronic pain: Secondary | ICD-10-CM

## 2019-03-24 DIAGNOSIS — M21061 Valgus deformity, not elsewhere classified, right knee: Secondary | ICD-10-CM | POA: Diagnosis present

## 2019-03-24 DIAGNOSIS — M1711 Unilateral primary osteoarthritis, right knee: Secondary | ICD-10-CM | POA: Insufficient documentation

## 2019-03-28 ENCOUNTER — Other Ambulatory Visit: Payer: Self-pay

## 2019-03-28 ENCOUNTER — Encounter: Payer: Self-pay | Admitting: Family Medicine

## 2019-03-28 ENCOUNTER — Ambulatory Visit (INDEPENDENT_AMBULATORY_CARE_PROVIDER_SITE_OTHER): Payer: Medicaid Other | Admitting: Family Medicine

## 2019-03-28 DIAGNOSIS — R21 Rash and other nonspecific skin eruption: Secondary | ICD-10-CM | POA: Diagnosis not present

## 2019-03-28 DIAGNOSIS — G8929 Other chronic pain: Secondary | ICD-10-CM

## 2019-03-28 DIAGNOSIS — R600 Localized edema: Secondary | ICD-10-CM

## 2019-03-28 DIAGNOSIS — M25561 Pain in right knee: Secondary | ICD-10-CM

## 2019-03-28 MED ORDER — TRIAMCINOLONE ACETONIDE 0.1 % EX CREA: 1.0000 "application " | TOPICAL_CREAM | Freq: Two times a day (BID) | CUTANEOUS | 0 refills | Status: DC

## 2019-03-28 NOTE — Progress Notes (Signed)
There were no vitals taken for this visit.   Subjective:    [name], female    DOB: September 29, 1975, 44 y.o.   MRN: [id number]  HPI: Rhonda Martin is a 43 y.o. female  Chief Complaint  Patient presents with  . Leg Swelling  . Hypertension    . This visit was completed via telephone due to the restrictions of the COVID-19 pandemic. All issues as above were discussed and addressed. Physical exam was done as above through visual confirmation on telephone. If it was felt that the patient should be evaluated in the office, they were directed there. The patient verbally consented to this visit. . Location of the patient: home . Location of the provider: work . Those involved with this call:  . Provider: Merrie Roof, PA-C . CMA: Tiffany Reel, CMA . Front Desk/Registration: Jill Side  . Time spent on call: 25 minutes on the phone discussing health concerns. 10 minutes total spent in review of patient's record and preparation of their chart. I verified patient identity using two factors (patient name and date of birth). Patient consents verbally to being seen via telemedicine visit today.   Patient presenting virtually today to discuss several issues she's been having.   Appt was made to f/u on her LE edema she had during recent admission and after d/c with severe leg pain associated. Tolerating the medications well, taking faithfully. No further hypotensive episodes. Leg swelling much improved since but pain is still present. Was to be seeing Pain Management but states she's never gotten a call from anyone.   Working with [employer] regarding her right knee issues, awaiting knee replacement hopefully in the near future. 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   Getting attacked by bugs she thinks are coming from the woods near her house. She thinks they may be mites or fruit flies. Also got bitten by a snake yesterday. Spraying the  house, cleaning the house. Covered in itchy bites and snake bite to left knee that she thinks is doing ok. Has been using benadryl and cortisone creams and sprays without any relief.   Relevant past medical, surgical, family and social history reviewed and updated as indicated. Interim medical history since our last visit reviewed. Allergies and medications reviewed and updated.  Review of Systems  Per HPI unless specifically indicated above     Objective:    There were no vitals taken for this visit.  Wt Readings from Last 3 Encounters:  02/16/19 128 lb (58.1 kg)  02/06/19 136 lb 14.4 oz (62.1 kg)  01/27/19 120 lb 13 oz (54.8 kg)    Physical Exam  Unable to perform PE due to technical difficulties with video visit platform, unable to see patient  Results for orders placed or performed during the hospital encounter of 02/06/19  SARS CORONAVIRUS 2 (TAT 6-24 HRS) Nasopharyngeal Nasopharyngeal Swab   Specimen: Nasopharyngeal Swab  Result Value Ref Range   SARS Coronavirus 2 NEGATIVE NEGATIVE  Basic metabolic panel  Result Value Ref Range   Sodium 135 135 - 145 mmol/L   Potassium 4.0 3.5 - 5.1 mmol/L   Chloride 103 98 - 111 mmol/L   CO2 25 22 - 32 mmol/L   Glucose, Bld 89 70 - 99 mg/dL   BUN 6 6 - 20 mg/dL   Creatinine, Ser 0.38 (L) 0.44 - 1.00 mg/dL   Calcium 8.1 (L) 8.9 - 10.3 mg/dL  GFR calc non Af Amer >60 >60 mL/min   GFR calc Af Amer >60 >60 mL/min   Anion gap 7 5 - 15  CBC  Result Value Ref Range   WBC 6.4 4.0 - 10.5 K/uL   RBC 3.05 (L) 3.87 - 5.11 MIL/uL   Hemoglobin 10.0 (L) 12.0 - 15.0 g/dL   HCT 31.6 (L) 36.0 - 46.0 %   MCV 103.6 (H) 80.0 - 100.0 fL   MCH 32.8 26.0 - 34.0 pg   MCHC 31.6 30.0 - 36.0 g/dL   RDW 16.3 (H) 11.5 - 15.5 %   Platelets 285 150 - 400 K/uL   nRBC 0.0 0.0 - 0.2 %  Urinalysis, Complete w Microscopic  Result Value Ref Range   Color, Urine YELLOW (A) YELLOW   APPearance CLEAR (A) CLEAR   Specific Gravity, Urine 1.006 1.005 - 1.030    pH 6.0 5.0 - 8.0   Glucose, UA NEGATIVE NEGATIVE mg/dL   Hgb urine dipstick NEGATIVE NEGATIVE   Bilirubin Urine NEGATIVE NEGATIVE   Ketones, ur NEGATIVE NEGATIVE mg/dL   Protein, ur NEGATIVE NEGATIVE mg/dL   Nitrite NEGATIVE NEGATIVE   Leukocytes,Ua NEGATIVE NEGATIVE   RBC / HPF 0-5 0 - 5 RBC/hpf   WBC, UA 0-5 0 - 5 WBC/hpf   Bacteria, UA RARE (A) NONE SEEN   Squamous Epithelial / LPF 0-5 0 - 5  Glucose, capillary  Result Value Ref Range   Glucose-Capillary 82 70 - 99 mg/dL  Ethanol  Result Value Ref Range   Alcohol, Ethyl (B) <10 <10 mg/dL  Urine Drug Screen, Qualitative ([facility] only)  Result Value Ref Range   Tricyclic, Ur Screen NONE DETECTED NONE DETECTED   Amphetamines, Ur Screen NONE DETECTED NONE DETECTED   MDMA (Ecstasy)Ur Screen NONE DETECTED NONE DETECTED   Cocaine Metabolite,Ur [place] NONE DETECTED NONE DETECTED   Opiate, Ur Screen POSITIVE (A) NONE DETECTED   Phencyclidine (PCP) Ur S NONE DETECTED NONE DETECTED   Cannabinoid 50 Ng, Ur [place] NONE DETECTED NONE DETECTED   Barbiturates, Ur Screen NONE DETECTED NONE DETECTED   Benzodiazepine, Ur Scrn POSITIVE (A) NONE DETECTED   Methadone Scn, Ur NONE DETECTED NONE DETECTED  Hepatic function panel  Result Value Ref Range   Total Protein 6.8 6.5 - 8.1 g/dL   Albumin 2.5 (L) 3.5 - 5.0 g/dL   AST 86 (H) 15 - 41 U/L   ALT 25 0 - 44 U/L   Alkaline Phosphatase 107 38 - 126 U/L   Total Bilirubin 2.2 (H) 0.3 - 1.2 mg/dL   Bilirubin, Direct 1.1 (H) 0.0 - 0.2 mg/dL   Indirect Bilirubin 1.1 (H) 0.3 - 0.9 mg/dL  Protime-INR  Result Value Ref Range   Prothrombin Time 17.2 (H) 11.4 - 15.2 seconds   INR 1.4 (H) 0.8 - 1.2  CBC  Result Value Ref Range   WBC 6.4 4.0 - 10.5 K/uL   RBC 2.57 (L) 3.87 - 5.11 MIL/uL   Hemoglobin 8.2 (L) 12.0 - 15.0 g/dL   HCT 26.4 (L) 36.0 - 46.0 %   MCV 102.7 (H) 80.0 - 100.0 fL   MCH 31.9 26.0 - 34.0 pg   MCHC 31.1 30.0 - 36.0 g/dL   RDW 16.4 (H) 11.5 - 15.5 %   Platelets 280 150 - 400 K/uL   nRBC  0.0 0.0 - 0.2 %  Basic metabolic panel  Result Value Ref Range   Sodium 139 135 - 145 mmol/L   Potassium 3.0 (L) 3.5 - 5.1  mmol/L   Chloride 107 98 - 111 mmol/L   CO2 25 22 - 32 mmol/L   Glucose, Bld 84 70 - 99 mg/dL   BUN <5 (L) 6 - 20 mg/dL   Creatinine, Ser 0.35 (L) 0.44 - 1.00 mg/dL   Calcium 7.6 (L) 8.9 - 10.3 mg/dL   GFR calc non Af Amer >60 >60 mL/min   GFR calc Af Amer >60 >60 mL/min   Anion gap 7 5 - 15  CBC  Result Value Ref Range   WBC 5.4 4.0 - 10.5 K/uL   RBC 2.68 (L) 3.87 - 5.11 MIL/uL   Hemoglobin 8.6 (L) 12.0 - 15.0 g/dL   HCT 26.6 (L) 36.0 - 46.0 %   MCV 99.3 80.0 - 100.0 fL   MCH 32.1 26.0 - 34.0 pg   MCHC 32.3 30.0 - 36.0 g/dL   RDW 16.3 (H) 11.5 - 15.5 %   Platelets 277 150 - 400 K/uL   nRBC 0.0 0.0 - 0.2 %  Magnesium  Result Value Ref Range   Magnesium 1.7 1.7 - 2.4 mg/dL  ECHOCARDIOGRAM COMPLETE  Result Value Ref Range   Weight 2,190.4 oz   Height 64 in   BP 107/66 mmHg      Assessment & Plan:   Problem List Items Addressed This Visit      Other   Bilateral leg edema - Primary    Per patient, improved. Recommended in person visit tomorrow to check vitals, examine, and obtain labs. Patient agreeable and scheduled      Chronic pain of right knee    Working with [place] on her knee issues, awaiting surgery       Other Visit Diagnoses    Rash       Patient describing numerous types of pest issues, as well as a snake bite to knee. Scheduled for in person visit tomorrow to eval. Triamcinolone cream sent       Follow up plan: Return in about 1 day (around 03/29/2019) for In person visit .

## 2019-03-29 ENCOUNTER — Encounter: Payer: Self-pay | Admitting: Family Medicine

## 2019-03-29 ENCOUNTER — Ambulatory Visit (INDEPENDENT_AMBULATORY_CARE_PROVIDER_SITE_OTHER): Payer: Medicaid Other | Admitting: Family Medicine

## 2019-03-29 VITALS — BP 115/85 | HR 130 | Temp 98.7°F

## 2019-03-29 DIAGNOSIS — R600 Localized edema: Secondary | ICD-10-CM | POA: Diagnosis not present

## 2019-03-29 DIAGNOSIS — F418 Other specified anxiety disorders: Secondary | ICD-10-CM

## 2019-03-29 DIAGNOSIS — F5101 Primary insomnia: Secondary | ICD-10-CM

## 2019-03-29 DIAGNOSIS — R000 Tachycardia, unspecified: Secondary | ICD-10-CM | POA: Diagnosis not present

## 2019-03-29 DIAGNOSIS — R5383 Other fatigue: Secondary | ICD-10-CM

## 2019-03-29 DIAGNOSIS — D696 Thrombocytopenia, unspecified: Secondary | ICD-10-CM

## 2019-03-29 DIAGNOSIS — R21 Rash and other nonspecific skin eruption: Secondary | ICD-10-CM

## 2019-03-29 MED ORDER — NORTRIPTYLINE HCL 25 MG PO CAPS: 25.0000 mg | ORAL_CAPSULE | Freq: Every day | ORAL | 0 refills | Status: DC

## 2019-03-29 NOTE — Progress Notes (Signed)
BP 115/85   Pulse (!) 130   Temp 98.7 F (37.1 C) (Oral)   SpO2 97%    Subjective:    [name], female    DOB: [birth year], 43 y.o.   MRN: [id number]  HPI: Rhonda Martin is a 43 y.o. female  Chief Complaint  Patient presents with  . Animal Bite    pt states she was bit by a snake 2 days ago while she was sleeping   Patient here today for several concerns.   States she's covered in itchy bug bites she believes to be mites or fruit fly bites from her house. [place] not have pets inside. States she has had to pull fruit flies out of her skin at times. Using benadryl cream without much relief. Also notes a snake bit her left knee the other day while she was napping. No drainage, redness, warmth, or swelling to area. Keeping clean and dry.   Several months of sleep issues, often unable to fall asleep and when able to she is not getting quality sleep. Tried ambien in the past which did work ok. Seroquel caused weight gain. Trazodone caused nightmares. Does note she's very anxious, but hydroxyzine seems to help with that if she takes it several times daily.   Awaiting knee replacement with [facility] for right knee issues, which include torn ACL, MCL, and severe OA. In significant pain from this. Was denied by Pain Management clinic she was referred to. Currently taking lots of naproxen and gabapentin which seem to take the edge off slightly but she still wakes up screaming and crying from pain.   LE edema - stopped chlorthalidone over 2 weeks ago once leg swelling subsided, BPs running in normal range and no issues with swelling since.   Relevant past medical, surgical, family and social history reviewed and updated as indicated. Interim medical history since our last visit reviewed. Allergies and medications reviewed and updated.  Review of Systems  Per HPI unless specifically indicated above     Objective:    BP 115/85   Pulse (!) 130    Temp 98.7 F (37.1 C) (Oral)   SpO2 97%   Wt Readings from Last 3 Encounters:  02/16/19 128 lb (58.1 kg)  02/06/19 136 lb 14.4 oz (62.1 kg)  01/27/19 120 lb 13 oz (54.8 kg)    Physical Exam Vitals signs and nursing note reviewed.  Constitutional:      Appearance: Normal appearance. She is not ill-appearing.  HENT:     Head: Atraumatic.  Eyes:     Extraocular Movements: Extraocular movements intact.     Conjunctiva/sclera: Conjunctivae normal.  Neck:     Musculoskeletal: Normal range of motion and neck supple.  Cardiovascular:     Rate and Rhythm: Regular rhythm. Tachycardia present.  Pulmonary:     Effort: Pulmonary effort is normal.     Breath sounds: Normal breath sounds.  Musculoskeletal: Normal range of motion.  Skin:    General: Skin is warm and dry.     Findings: Rash (small erythematous maculopapular areas across body, appear to be flea bites or something similar) present.     Comments: Two small circular scabs on left knee, about 2-3 inches apart  Neurological:     Mental Status: She is alert and oriented to person, place, and time.  Psychiatric:        Mood and Affect: Mood normal.  Thought Content: Thought content normal.        Judgment: Judgment normal.     Results for orders placed or performed in visit on 03/29/19  Comprehensive metabolic panel  Result Value Ref Range   Glucose 135 (H) 65 - 99 mg/dL   BUN 4 (L) 6 - 24 mg/dL   Creatinine, Ser 0.47 (L) 0.57 - 1.00 mg/dL   GFR calc non Af Amer 121 >59 mL/min/1.73   GFR calc Af Amer 140 >59 mL/min/1.73   BUN/Creatinine Ratio 9 9 - 23   Sodium 136 134 - 144 mmol/L   Potassium 2.8 (L) 3.5 - 5.2 mmol/L   Chloride 96 96 - 106 mmol/L   CO2 25 20 - 29 mmol/L   Calcium 7.6 (L) 8.7 - 10.2 mg/dL   Total Protein 7.1 6.0 - 8.5 g/dL   Albumin 2.9 (L) 3.8 - 4.8 g/dL   Globulin, Total 4.2 1.5 - 4.5 g/dL   Albumin/Globulin Ratio 0.7 (L) 1.2 - 2.2   Bilirubin Total 2.8 (H) 0.0 - 1.2 mg/dL   Alkaline  Phosphatase 193 (H) 39 - 117 IU/L   AST 143 (H) 0 - 40 IU/L   ALT 27 0 - 32 IU/L  CBC with Differential/Platelet  Result Value Ref Range   WBC 4.8 3.4 - 10.8 x10E3/uL   RBC 3.14 (L) 3.77 - 5.28 x10E6/uL   Hemoglobin 8.4 (L) 11.1 - 15.9 g/dL   Hematocrit 25.8 (L) 34.0 - 46.6 %   MCV 82 79 - 97 fL   MCH 26.8 26.6 - 33.0 pg   MCHC 32.6 31.5 - 35.7 g/dL   RDW 20.7 (H) 11.7 - 15.4 %   Platelets 102 (L) 150 - 450 x10E3/uL   Neutrophils 68 Not Estab. %   Lymphs 20 Not Estab. %   Monocytes 10 Not Estab. %   Eos 1 Not Estab. %   Basos 1 Not Estab. %   Neutrophils Absolute 3.3 1.4 - 7.0 x10E3/uL   Lymphocytes Absolute 0.9 0.7 - 3.1 x10E3/uL   Monocytes Absolute 0.5 0.1 - 0.9 x10E3/uL   EOS (ABSOLUTE) 0.1 0.0 - 0.4 x10E3/uL   Basophils Absolute 0.1 0.0 - 0.2 x10E3/uL   Immature Granulocytes 0 Not Estab. %   Immature Grans (Abs) 0.0 0.0 - 0.1 x10E3/uL   Hematology Comments: Note:   TSH  Result Value Ref Range   TSH 1.660 0.450 - 4.500 uIU/mL      Assessment & Plan:   Problem List Items Addressed This Visit      Other   Thrombocytopenia ([place])   Relevant Orders   CBC with Differential/Platelet (Completed)   Anxious depression    Continue hydroxyzine prn, breathing exercises      Relevant Medications   nortriptyline (PAMELOR) 25 MG capsule   Bilateral leg edema - Primary    Resolved, will check labs       Relevant Orders   Comprehensive metabolic panel (Completed)   Insomnia    Will trial nortriptyline for sleep in addition to continued efforts toward better pain control which includes upcoming surgery for right knee issues and helping to facilitate a pain management consultation.       Tachycardia    Ongoing, with typical rates during OVs between 110-150 per record review. Has numerous comorbidities including uncontrolled anxiety, anemia, chronic pain, etc that could be exacerbating things but will refer to Cardiology for further evaluation. Will hold off starting any  medication until this consultation given her recent soft BPs on  other low dose BP medication (diuretics at that time).       Relevant Orders   Ambulatory referral to Cardiology    Other Visit Diagnoses    Fatigue, unspecified type       Relevant Orders   Comprehensive metabolic panel (Completed)   TSH (Completed)   Rash       Does appear from bug bites of some kind. Recommended an exterminator come out. Triamcinolone cream for comfort       Follow up plan: Return in about 4 weeks (around 04/26/2019) for Sleep f/u.

## 2019-03-30 LAB — CBC WITH DIFFERENTIAL/PLATELET
Basophils Absolute: 0.1 10*3/uL (ref 0.0–0.2)
Basos: 1 %
EOS (ABSOLUTE): 0.1 10*3/uL (ref 0.0–0.4)
Eos: 1 %
Hematocrit: 25.8 % — ABNORMAL LOW (ref 34.0–46.6)
Hemoglobin: 8.4 g/dL — ABNORMAL LOW (ref 11.1–15.9)
Immature Grans (Abs): 0 10*3/uL (ref 0.0–0.1)
Immature Granulocytes: 0 %
Lymphocytes Absolute: 0.9 10*3/uL (ref 0.7–3.1)
Lymphs: 20 %
MCH: 26.8 pg (ref 26.6–33.0)
MCHC: 32.6 g/dL (ref 31.5–35.7)
MCV: 82 fL (ref 79–97)
Monocytes Absolute: 0.5 10*3/uL (ref 0.1–0.9)
Monocytes: 10 %
Neutrophils Absolute: 3.3 10*3/uL (ref 1.4–7.0)
Neutrophils: 68 %
Platelets: 102 10*3/uL — ABNORMAL LOW (ref 150–450)
RBC: 3.14 x10E6/uL — ABNORMAL LOW (ref 3.77–5.28)
RDW: 20.7 % — ABNORMAL HIGH (ref 11.7–15.4)
WBC: 4.8 10*3/uL (ref 3.4–10.8)

## 2019-03-30 LAB — COMPREHENSIVE METABOLIC PANEL
ALT: 27 IU/L (ref 0–32)
AST: 143 IU/L — ABNORMAL HIGH (ref 0–40)
Albumin/Globulin Ratio: 0.7 — ABNORMAL LOW (ref 1.2–2.2)
Albumin: 2.9 g/dL — ABNORMAL LOW (ref 3.8–4.8)
Alkaline Phosphatase: 193 IU/L — ABNORMAL HIGH (ref 39–117)
BUN/Creatinine Ratio: 9 (ref 9–23)
BUN: 4 mg/dL — ABNORMAL LOW (ref 6–24)
Bilirubin Total: 2.8 mg/dL — ABNORMAL HIGH (ref 0.0–1.2)
CO2: 25 mmol/L (ref 20–29)
Calcium: 7.6 mg/dL — ABNORMAL LOW (ref 8.7–10.2)
Chloride: 96 mmol/L (ref 96–106)
Creatinine, Ser: 0.47 mg/dL — ABNORMAL LOW (ref 0.57–1.00)
GFR calc Af Amer: 140 mL/min/{1.73_m2} (ref >59)
GFR calc non Af Amer: 121 mL/min/{1.73_m2} (ref >59)
Globulin, Total: 4.2 g/dL (ref 1.5–4.5)
Glucose: 135 mg/dL — ABNORMAL HIGH (ref 65–99)
Potassium: 2.8 mmol/L — ABNORMAL LOW (ref 3.5–5.2)
Sodium: 136 mmol/L (ref 134–144)
Total Protein: 7.1 g/dL (ref 6.0–8.5)

## 2019-03-30 LAB — TSH: TSH: 1.66 u[IU]/mL (ref 0.450–4.500)

## 2019-03-31 ENCOUNTER — Telehealth: Payer: Self-pay | Admitting: Family Medicine

## 2019-03-31 ENCOUNTER — Ambulatory Visit: Payer: Medicaid Other | Admitting: Internal Medicine

## 2019-03-31 DIAGNOSIS — M25561 Pain in right knee: Secondary | ICD-10-CM | POA: Insufficient documentation

## 2019-03-31 DIAGNOSIS — G600 Hereditary motor and sensory neuropathy: Secondary | ICD-10-CM

## 2019-03-31 DIAGNOSIS — E876 Hypokalemia: Secondary | ICD-10-CM

## 2019-03-31 DIAGNOSIS — G8929 Other chronic pain: Secondary | ICD-10-CM

## 2019-03-31 MED ORDER — POTASSIUM CHLORIDE ER 10 MEQ PO TBCR: 20.0000 meq | EXTENDED_RELEASE_TABLET | Freq: Every day | ORAL | 0 refills | Status: DC

## 2019-03-31 MED ORDER — METOPROLOL SUCCINATE ER 25 MG PO TB24: 25.0000 mg | ORAL_TABLET | Freq: Every day | ORAL | 0 refills | Status: DC

## 2019-03-31 NOTE — Telephone Encounter (Signed)
Called pt to discuss lab results, her blood counts continue to be abnormal but she is following with Hematology. Potassium is down to 2.8 and patient states she hasn't taken her chlorthalidone in weeks so discussed continued d/c of it. She c/o worsening heart racing and Cardiology was unable to see her today due to an emergency so will send over some metoprolol over the weekend in addition to supplemental potassium at 20 meq daily. If labs not checked at Cardiology appt Thursday of next week discussed coming in for a lab draw at end of next week. Order placed for this in case needed.

## 2019-03-31 NOTE — Assessment & Plan Note (Signed)
Working with [employer] on her knee issues, awaiting surgery

## 2019-03-31 NOTE — Progress Notes (Deleted)
New Outpatient Visit Date: 03/31/2019  Referring Provider: Volney American, PA-C [address] [place],  [place] [address]  Chief Complaint: ***  HPI:  Rhonda Martin is a 43 y.o. female who is being seen today for the evaluation of tachycardia and chest pain at the request of Ms. Lane. She has a history of cirrhosis due to hepatitis C and alcohol abuse, hypertension, Charcot-Marie-Tooth disease, cervical cancer, and anxiety. ***  --------------------------------------------------------------------------------------------------  Cardiovascular History & Procedures: Cardiovascular Problems:  ***  Risk Factors:  ***  Cath/PCI:  ***  CV Surgery:  ***  EP Procedures and Devices:  ***  Non-Invasive Evaluation(s):  TTE (02/07/2019): Normal LV size and wall thickness.  LVEF 55-60% with normal diastolic function.  Normal RV size and function.  Mild aortic regurgitation.  Recent CV Pertinent Labs: Lab Results  Component Value Date   INR 1.4 (H) 02/07/2019   K 2.8 (L) 03/29/2019   MG 1.7 02/08/2019   BUN 4 (L) 03/29/2019   CREATININE 0.47 (L) 03/29/2019    --------------------------------------------------------------------------------------------------  Past Medical History:  Diagnosis Date  . Back injury   . Cervical cancer ([place])   . Charcot-Marie-Tooth disease   . Hepatitis   . Hypertension   . Leg injury   . Liver cirrhosis [facility])     Past Surgical History:  Procedure Laterality Date  . BACK SURGERY    . LEG SURGERY      No outpatient medications have been marked as taking for the 03/31/19 encounter (Appointment) with Bracken Moffa, Harrell Gave, MD.    Allergies: Patient has no known allergies.  Social History   Tobacco Use  . Smoking status: Current Every Day Smoker    Packs/day: 0.25    Years: 30.00    Pack years: 7.50    Types: Cigarettes  . Smokeless tobacco: Never Used  Substance Use Topics  . Alcohol use: Yes    Alcohol/week: 5.0 standard drinks     Types: 5 Shots of liquor per week    Comment: 1 pint per day drinker. Last drink around 0300  . Drug use: Not Currently    Family History  Problem Relation Age of Onset  . Diabetes Mother   . Hypertension Mother   . Cancer Father        unknown what kind of cancer   . Hypertension Sister   . Hypertension Brother   . Heart attack Brother     Review of Systems: A 12-system review of systems was performed and was negative except as noted in the HPI.  --------------------------------------------------------------------------------------------------  Physical Exam: There were no vitals taken for this visit.  General:  *** HEENT: No conjunctival pallor or scleral icterus. Moist mucous membranes. OP clear. Neck: Supple without lymphadenopathy, thyromegaly, JVD, or HJR. No carotid bruit. Lungs: Normal work of breathing. Clear to auscultation bilaterally without wheezes or crackles. Heart: Regular rate and rhythm without murmurs, rubs, or gallops. Non-displaced PMI. Abd: Bowel sounds present. Soft, NT/ND without hepatosplenomegaly Ext: No lower extremity edema. Radial, PT, and DP pulses are 2+ bilaterally Skin: Warm and dry without rash. Neuro: CNIII-XII intact. Strength and fine-touch sensation intact in upper and lower extremities bilaterally. Psych: Normal mood and affect.  EKG:  ***  Lab Results  Component Value Date   WBC 4.8 03/29/2019   HGB 8.4 (L) 03/29/2019   HCT 25.8 (L) 03/29/2019   MCV 82 03/29/2019   PLT 102 (L) 03/29/2019    Lab Results  Component Value Date   NA 136  03/29/2019   K 2.8 (L) 03/29/2019   CL 96 03/29/2019   CO2 25 03/29/2019   BUN 4 (L) 03/29/2019   CREATININE 0.47 (L) 03/29/2019   GLUCOSE 135 (H) 03/29/2019   ALT 27 03/29/2019    No results found for: CHOL, HDL, LDLCALC, LDLDIRECT, TRIG, CHOLHDL   --------------------------------------------------------------------------------------------------  ASSESSMENT AND PLAN: ***   Nelva Bush, MD 03/31/2019 12:58 PM

## 2019-03-31 NOTE — Assessment & Plan Note (Signed)
Per patient, improved. Recommended in person visit tomorrow to check vitals, examine, and obtain labs. Patient agreeable and scheduled

## 2019-03-31 NOTE — Telephone Encounter (Signed)
New urgent referral placed - please re-route elsewhere  Copied from [place] [id number]. Topic: Referral - Status >> Mar 31, 2019 10:51 AM Lennox Solders wrote: Reason for CRM: pt is calling and was denied an appt with preferred pain management spine care. Pt would like another referral to pain management. Pt is in a lot of pain

## 2019-04-03 DIAGNOSIS — G4700 Insomnia, unspecified: Secondary | ICD-10-CM | POA: Insufficient documentation

## 2019-04-03 DIAGNOSIS — R000 Tachycardia, unspecified: Secondary | ICD-10-CM | POA: Insufficient documentation

## 2019-04-03 NOTE — Assessment & Plan Note (Signed)
Continue hydroxyzine prn, breathing exercises

## 2019-04-03 NOTE — Assessment & Plan Note (Signed)
Resolved, will check labs

## 2019-04-03 NOTE — Assessment & Plan Note (Signed)
Ongoing, with typical rates during OVs between 110-150 per record review. Has numerous comorbidities including uncontrolled anxiety, anemia, chronic pain, etc that could be exacerbating things but will refer to Cardiology for further evaluation. Will hold off starting any medication until this consultation given her recent soft BPs on other low dose BP medication (diuretics at that time).

## 2019-04-03 NOTE — Assessment & Plan Note (Signed)
Will trial nortriptyline for sleep in addition to continued efforts toward better pain control which includes upcoming surgery for right knee issues and helping to facilitate a pain management consultation.

## 2019-04-06 ENCOUNTER — Encounter: Payer: Self-pay | Admitting: Internal Medicine

## 2019-04-06 ENCOUNTER — Other Ambulatory Visit
Admission: RE | Admit: 2019-04-06 | Discharge: 2019-04-06 | Disposition: A | Discharge status: Home / Self Care | Payer: Medicaid Other | Source: Ambulatory Visit | Attending: Internal Medicine | Admitting: Internal Medicine

## 2019-04-06 ENCOUNTER — Other Ambulatory Visit: Payer: Self-pay

## 2019-04-06 ENCOUNTER — Ambulatory Visit (INDEPENDENT_AMBULATORY_CARE_PROVIDER_SITE_OTHER): Payer: Medicaid Other | Admitting: Internal Medicine

## 2019-04-06 VITALS — BP 118/80 | HR 94 | Ht 62.0 in | Wt 126.2 lb

## 2019-04-06 DIAGNOSIS — R000 Tachycardia, unspecified: Secondary | ICD-10-CM | POA: Diagnosis present

## 2019-04-06 DIAGNOSIS — K7460 Unspecified cirrhosis of liver: Secondary | ICD-10-CM | POA: Diagnosis not present

## 2019-04-06 DIAGNOSIS — K625 Hemorrhage of anus and rectum: Secondary | ICD-10-CM | POA: Diagnosis not present

## 2019-04-06 DIAGNOSIS — K921 Melena: Secondary | ICD-10-CM | POA: Diagnosis not present

## 2019-04-06 LAB — BASIC METABOLIC PANEL
Anion gap: 9 (ref 5–15)
BUN: <5 mg/dL — ABNORMAL LOW (ref 6–20)
CO2: 26 mmol/L (ref 22–32)
Calcium: 8.1 mg/dL — ABNORMAL LOW (ref 8.9–10.3)
Chloride: 102 mmol/L (ref 98–111)
Creatinine, Ser: 0.36 mg/dL — ABNORMAL LOW (ref 0.44–1.00)
GFR calc Af Amer: >60 mL/min (ref >60)
GFR calc non Af Amer: >60 mL/min (ref >60)
Glucose, Bld: 104 mg/dL — ABNORMAL HIGH (ref 70–99)
Potassium: 4.3 mmol/L (ref 3.5–5.1)
Sodium: 137 mmol/L (ref 135–145)

## 2019-04-06 LAB — MAGNESIUM: Magnesium: 1.7 mg/dL (ref 1.7–2.4)

## 2019-04-06 LAB — CBC
HCT: 28.3 % — ABNORMAL LOW (ref 36.0–46.0)
Hemoglobin: 8.9 g/dL — ABNORMAL LOW (ref 12.0–15.0)
MCH: 26.8 pg (ref 26.0–34.0)
MCHC: 31.4 g/dL (ref 30.0–36.0)
MCV: 85.2 fL (ref 80.0–100.0)
Platelets: 85 10*3/uL — ABNORMAL LOW (ref 150–400)
RBC: 3.32 MIL/uL — ABNORMAL LOW (ref 3.87–5.11)
RDW: 26.9 % — ABNORMAL HIGH (ref 11.5–15.5)
WBC: 4.5 10*3/uL (ref 4.0–10.5)
nRBC: 0 % (ref 0.0–0.2)

## 2019-04-06 MED ORDER — BLOOD PRESSURE MONITOR MISC: 0 refills | Status: DC

## 2019-04-06 NOTE — Progress Notes (Signed)
New Outpatient Visit Date: 04/06/2019  Referring Provider: Particia Nearing, PA-C [address] [place],  [place] [address]  Chief Complaint: Elevated heart rates and chest pain  HPI:  Ms. Bedrick is a 43 y.o. female who is being seen today for the evaluation of tachycardia and chest pain at the request of Ms. Lane. She has a history of cirrhosis due to hepatitis C and alcohol abuse, hypertension, Charcot-Marie-Tooth disease, cervical cancer, and anxiety.  Ms. Engelberg reports elevated heart rates "for a while," up to 150 bpm at rest.  She intermittently feels as though her heart is skipper a beat.  She also reports intermittent sharp/stabbing chest pain that seems to accompany particularly high heart rates.  The pain comes and goes, lasting a few seconds at a time.  It is substernal without radiation.  She was recently started on metoprolol by Ms. Lane with significant improvement in her heart rates, palpitations, and chest pain.  However, she feels like the metoprolol has made her feel sluggish and slightly lightheaded.  However, these side effects seem to be improving as she remains on the medication.  Ms. Kitchell denies a history of prior heart disease.  Echocardiogram was performed during hospitalization for edema in 02/2019.  She has chronic pain in her legs and back.  She also has chronic anemia and liver disease.  She was just recently told that she has hepatitis.  She consumes at least 5 shots of vodka per day, noting that this is a significant reduction from prior drinking.  Ms. Mehlhorn was admitted in late August for leg edema.  Echo was unrevealing other than mild aortic regurgitation.  She was found to have cirrhosis.  She was discharged on furosemide but recently stopped this when metoprolol was started.  She has gained 6 pounds over the last week, though she does not have edema.  She endorses mild abdominal distention, however.  She denies orthopnea and PND.  She notes a few episodes of  bright red blood per rectum yesterday.  --------------------------------------------------------------------------------------------------  Cardiovascular History & Procedures: Cardiovascular Problems:  Chest pain  Tachycardia  Risk Factors:  None  Cath/PCI:  None  CV Surgery:  None  EP Procedures and Devices:  None  Non-Invasive Evaluation(s):  TTE (02/07/2019): Normal LV size and wall thickness.  LVEF 55-60% with normal diastolic function.  Normal RV size and function.  Mild aortic regurgitation.  Recent CV Pertinent Labs: Lab Results  Component Value Date   INR 1.4 (H) 02/07/2019   K 2.8 (L) 03/29/2019   MG 1.7 02/08/2019   BUN 4 (L) 03/29/2019   CREATININE 0.47 (L) 03/29/2019    --------------------------------------------------------------------------------------------------  Past Medical History:  Diagnosis Date  . Back injury   . Cervical cancer ([facility])   . Charcot-Marie-Tooth disease   . Hepatitis   . Hypertension   . Leg injury   . Liver cirrhosis [facility])     Past Surgical History:  Procedure Laterality Date  . BACK SURGERY    . LEG SURGERY      Current Meds  Medication Sig  . folic acid (FOLVITE) 1 MG tablet Take 1 tablet (1 mg total) by mouth daily.  . hydrOXYzine (ATARAX/VISTARIL) 25 MG tablet Take 1 tablet (25 mg total) by mouth 3 (three) times daily as needed.  . metoprolol succinate (TOPROL-XL) 25 MG 24 hr tablet Take 1 tablet (25 mg total) by mouth daily.  . Multiple Vitamin (MULTIVITAMIN WITH MINERALS) TABS tablet Take 1 tablet by mouth daily.  Marland Kitchen  naproxen sodium (ALEVE) 220 MG tablet Take 220 mg by mouth daily as needed.   . nortriptyline (PAMELOR) 25 MG capsule Take 1 capsule (25 mg total) by mouth at bedtime.  . pantoprazole (PROTONIX) 40 MG tablet Take 1 tablet (40 mg total) by mouth daily.  . potassium chloride (KLOR-CON) 10 MEQ tablet Take 2 tablets (20 mEq total) by mouth daily.  . sucralfate (CARAFATE) 1 g tablet Take 1 tablet (1  g total) by mouth 4 (four) times daily -  with meals and at bedtime.  . thiamine 100 MG tablet Take 1 tablet (100 mg total) by mouth daily.  Marland Kitchen triamcinolone cream (KENALOG) 0.1 % Apply 1 application topically 2 (two) times daily.    Allergies: Patient has no known allergies.  Social History   Tobacco Use  . Smoking status: Current Every Day Smoker    Packs/day: 0.50    Years: 30.00    Pack years: 15.00    Types: Cigarettes  . Smokeless tobacco: Never Used  Substance Use Topics  . Alcohol use: Yes    Alcohol/week: 5.0 standard drinks    Types: 5 Shots of liquor per week    Comment: 1 pint per day drinker. Last drink around 0300  . Drug use: Not Currently    Family History  Problem Relation Age of Onset  . Diabetes Mother   . Hypertension Mother   . Cancer Father        unknown what kind of cancer   . Hypertension Sister   . Hypertension Brother   . Heart attack Brother     Review of Systems: A 12-system review of systems was performed and was negative except as noted in the HPI.  --------------------------------------------------------------------------------------------------  Physical Exam: BP 118/80 (BP Location: Right Arm, Patient Position: Sitting, Cuff Size: Normal)   Pulse 94   Ht 5\' 2"  (1.575 m)   Wt 126 lb 4 oz (57.3 kg)   SpO2 98%   BMI 23.09 kg/m   General:  NAD. HEENT: Mild conjunctival pallor noted.  No significant scleral icteris. Moist mucous membranes. OP clear. Neck: Supple without lymphadenopathy, thyromegaly, JVD, or HJR. No carotid bruit. Lungs: Normal work of breathing. Clear to auscultation bilaterally without wheezes or crackles. Heart: Regular rate and rhythm without murmurs, rubs, or gallops. Non-displaced PMI. Abd: Bowel sounds present. Soft, NT/ND without hepatosplenomegaly Ext: No lower extremity edema. Radial, PT, and DP pulses are 2+ bilaterally Skin: Warm and dry without rash. Neuro: CNIII-XII intact. Strength and fine-touch  sensation intact in upper and lower extremities bilaterally. Psych: Normal mood and affect.  EKG:  NSR without significant abnormality.  Lab Results  Component Value Date   WBC 4.8 03/29/2019   HGB 8.4 (L) 03/29/2019   HCT 25.8 (L) 03/29/2019   MCV 82 03/29/2019   PLT 102 (L) 03/29/2019    Lab Results  Component Value Date   NA 136 03/29/2019   K 2.8 (L) 03/29/2019   CL 96 03/29/2019   CO2 25 03/29/2019   BUN 4 (L) 03/29/2019   CREATININE 0.47 (L) 03/29/2019   GLUCOSE 135 (H) 03/29/2019   ALT 27 03/29/2019    No results found for: CHOL, HDL, LDLCALC, LDLDIRECT, TRIG, CHOLHDL   --------------------------------------------------------------------------------------------------  ASSESSMENT AND PLAN: Sinus tachycardia: I suspect this is related to underlying medical illness, including cirrhosis, chronic anemia, and electrolyte deficiencies.  I think it is reasonable to continue with low-dose metoprolol, as it has improved her symptoms quite a bit.  I will repeat  a BMP, Mg level, and CBC today to reassess her electrolytes and hemoglobin.  I do not feel that ambulatory event monitoring is indicated at this time.  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  In encouraged Ms. Ohle to stop drinking.  She should continue to follow-up with GI.  No changes to her current diuretic regimen, though she will likely need to restart furosemide +/- spironolactone in the future.  I will defer this to hepatology.  Hematochezia: A few episodes were noted by the patient yesterday.  It is difficult to quantify the severity.  She has not had any further episodes today.  BP and heart rate are stable.  I will check a CBC today.  If hemoglobin has dropped further of Ms. Kilic has further episodes of rectal bleeding, she will need to go to the ED for further evaluation.  Follow-up: Return to clinic in 1 month.  Yvonne Kendall, MD 04/06/2019 2:13 PM

## 2019-04-06 NOTE — Patient Instructions (Signed)
If you have any more rectal bleeding or new/worsening symptoms, please go to the emergency room.   Medication Instructions:  Your physician recommends that you continue on your current medications as directed. Please refer to the Current Medication list given to you today.  *If you need a refill on your cardiac medications before your next appointment, please call your pharmacy*  Lab Work: Your physician recommends that you return for lab work in: TODAY - BMP, Mg, CBC no diff. - - Please go to the [facility]. You will check in at the front desk to the right as you walk into the atrium. Valet Parking is offered if needed. - No appointment needed. You may go any day between 7 am and 6 pm.   If you have labs (blood work) drawn today and your tests are completely normal, you will receive your results only by: Marland Kitchen MyChart Message (if you have MyChart) OR . A paper copy in the mail If you have any lab test that is abnormal or we need to change your treatment, we will call you to review the results.  Testing/Procedures: NONE  Follow-Up: At [facility], you and your health needs are our priority.  As part of our continuing mission to provide you with exceptional heart care, we have created designated Provider Care Teams.  These Care Teams include your primary Cardiologist (physician) and Advanced Practice Providers (APPs -  Physician Assistants and Nurse Practitioners) who all work together to provide you with the care you need, when you need it.  Your next appointment:   1 month.  The format for your next appointment:   In Person  Provider:    You may see DR Harrell Gave END or one of the following Advanced Practice Providers on your designated Care Team:    Murray Hodgkins, NP  Christell Faith, PA-C  Marrianne Mood, PA-C

## 2019-04-07 ENCOUNTER — Encounter: Payer: Self-pay | Admitting: Internal Medicine

## 2019-04-26 ENCOUNTER — Ambulatory Visit: Payer: Medicaid Other | Admitting: Family Medicine

## 2019-04-27 ENCOUNTER — Other Ambulatory Visit: Payer: Self-pay | Admitting: Family Medicine

## 2019-04-27 ENCOUNTER — Other Ambulatory Visit: Payer: Self-pay | Admitting: Oncology

## 2019-04-27 NOTE — Telephone Encounter (Signed)
Requested medication (s) are due for refill today: no  Requested medication (s) are on the active medication list:no  Last refill:  02/03/2019  Future visit scheduled: no  Notes to clinic:  Medication was discontinued    Requested Prescriptions  Pending Prescriptions Disp Refills   gabapentin (NEURONTIN) 300 MG capsule [Pharmacy Med Name: GABAPENTIN 300 MG CAPSULE] 90 capsule 0    Sig: TAKE 1-3 CAPSULES DAILY AS NEEDED     Neurology: Anticonvulsants - gabapentin Passed - 04/27/2019  9:31 AM      Passed - Valid encounter within last 12 months    Recent Outpatient Visits          4 weeks ago Bilateral leg edema   [facility] Volney American, [place]   1 month ago Bilateral leg edema   [place], [place], [place]   2 months ago Bilateral leg edema   [place], [place], [place]   4 months ago Thrombocytopenia [facility])   [place], [place], [place]      Future Appointments            In 2 weeks End, Harrell Gave, MD [facility], [id number]           Signed Prescriptions Disp Refills   metoprolol succinate (TOPROL-XL) 25 MG 24 hr tablet 30 tablet 0    Sig: TAKE 1 TABLET BY MOUTH EVERY DAY     Cardiovascular:  Beta Blockers Passed - 04/27/2019  9:31 AM      Passed - Last BP in normal range    BP Readings from Last 1 Encounters:  04/06/19 118/80         Passed - Last Heart Rate in normal range    Pulse Readings from Last 1 Encounters:  04/06/19 94         Passed - Valid encounter within last 6 months    Recent Outpatient Visits          4 weeks ago Bilateral leg edema   [place], [place]   1 month ago Bilateral leg edema   [place], [place]   2 months ago Bilateral leg edema   [place], [place]   4 months ago Thrombocytopenia [facility])   [place], [place], [place]      Future Appointments            In 2 weeks End, Harrell Gave, MD [facility], [place]

## 2019-04-28 ENCOUNTER — Other Ambulatory Visit: Payer: Self-pay | Admitting: Orthopedic Surgery

## 2019-05-01 ENCOUNTER — Other Ambulatory Visit: Payer: Self-pay

## 2019-05-01 ENCOUNTER — Encounter: Payer: Self-pay | Admitting: Family Medicine

## 2019-05-01 DIAGNOSIS — R933 Abnormal findings on diagnostic imaging of other parts of digestive tract: Secondary | ICD-10-CM

## 2019-05-01 DIAGNOSIS — R7989 Other specified abnormal findings of blood chemistry: Secondary | ICD-10-CM

## 2019-05-01 DIAGNOSIS — R945 Abnormal results of liver function studies: Secondary | ICD-10-CM

## 2019-05-01 NOTE — Telephone Encounter (Signed)
Spoke with pt and was able to schedule endoscopy procedure. Pt is aware she'll receive the procedure prep instructions via MyChart.

## 2019-05-02 ENCOUNTER — Ambulatory Visit: Payer: Medicaid Other | Admitting: Oncology

## 2019-05-02 ENCOUNTER — Other Ambulatory Visit: Payer: Medicaid Other

## 2019-05-08 ENCOUNTER — Other Ambulatory Visit
Admission: RE | Admit: 2019-05-08 | Discharge: 2019-05-08 | Disposition: A | Discharge status: Home / Self Care | Payer: Medicaid Other | Source: Ambulatory Visit | Attending: Gastroenterology | Admitting: Gastroenterology

## 2019-05-08 DIAGNOSIS — Z20828 Contact with and (suspected) exposure to other viral communicable diseases: Secondary | ICD-10-CM | POA: Insufficient documentation

## 2019-05-08 DIAGNOSIS — Z01812 Encounter for preprocedural laboratory examination: Secondary | ICD-10-CM | POA: Diagnosis present

## 2019-05-09 LAB — SARS CORONAVIRUS 2 (TAT 6-24 HRS): SARS Coronavirus 2: NEGATIVE

## 2019-05-10 ENCOUNTER — Other Ambulatory Visit: Payer: Self-pay

## 2019-05-10 ENCOUNTER — Encounter
Admission: RE | Admit: 2019-05-10 | Discharge: 2019-05-10 | Disposition: A | Discharge status: Home / Self Care | Payer: Medicaid Other | Source: Ambulatory Visit | Attending: Orthopedic Surgery | Admitting: Orthopedic Surgery

## 2019-05-10 DIAGNOSIS — Z01812 Encounter for preprocedural laboratory examination: Secondary | ICD-10-CM | POA: Diagnosis present

## 2019-05-10 HISTORY — DX: Family history of other specified conditions: Z84.89

## 2019-05-10 HISTORY — DX: Gastro-esophageal reflux disease without esophagitis: K21.9

## 2019-05-10 HISTORY — DX: Chronic obstructive pulmonary disease, unspecified: J44.9

## 2019-05-10 LAB — URINALYSIS, ROUTINE W REFLEX MICROSCOPIC
Bilirubin Urine: NEGATIVE
Glucose, UA: NEGATIVE mg/dL
Hgb urine dipstick: NEGATIVE
Ketones, ur: NEGATIVE mg/dL
Leukocytes,Ua: NEGATIVE
Nitrite: NEGATIVE
Protein, ur: NEGATIVE mg/dL
Specific Gravity, Urine: 1.013 (ref 1.005–1.030)
pH: 6 (ref 5.0–8.0)

## 2019-05-10 LAB — TYPE AND SCREEN
ABO/RH(D): A POS
Antibody Screen: NEGATIVE

## 2019-05-10 LAB — PROTIME-INR
INR: 1.4 — ABNORMAL HIGH (ref 0.8–1.2)
Prothrombin Time: 16.9 seconds — ABNORMAL HIGH (ref 11.4–15.2)

## 2019-05-10 LAB — CBC WITH DIFFERENTIAL/PLATELET
Abs Immature Granulocytes: 0.02 10*3/uL (ref 0.00–0.07)
Basophils Absolute: 0.1 10*3/uL (ref 0.0–0.1)
Basophils Relative: 1 %
Eosinophils Absolute: 0.2 10*3/uL (ref 0.0–0.5)
Eosinophils Relative: 2 %
HCT: 27.9 % — ABNORMAL LOW (ref 36.0–46.0)
Hemoglobin: 8.9 g/dL — ABNORMAL LOW (ref 12.0–15.0)
Immature Granulocytes: 0 %
Lymphocytes Relative: 12 %
Lymphs Abs: 0.9 10*3/uL (ref 0.7–4.0)
MCH: 25.3 pg — ABNORMAL LOW (ref 26.0–34.0)
MCHC: 31.9 g/dL (ref 30.0–36.0)
MCV: 79.3 fL — ABNORMAL LOW (ref 80.0–100.0)
Monocytes Absolute: 0.7 10*3/uL (ref 0.1–1.0)
Monocytes Relative: 9 %
Neutro Abs: 5.6 10*3/uL (ref 1.7–7.7)
Neutrophils Relative %: 76 %
Platelets: 225 10*3/uL (ref 150–400)
RBC: 3.52 MIL/uL — ABNORMAL LOW (ref 3.87–5.11)
RDW: 17.5 % — ABNORMAL HIGH (ref 11.5–15.5)
WBC: 7.5 10*3/uL (ref 4.0–10.5)
nRBC: 0 % (ref 0.0–0.2)

## 2019-05-10 LAB — SURGICAL PCR SCREEN
MRSA, PCR: NEGATIVE
Staphylococcus aureus: NEGATIVE

## 2019-05-10 LAB — APTT: aPTT: 41 seconds — ABNORMAL HIGH (ref 24–36)

## 2019-05-10 NOTE — Patient Instructions (Signed)
Your pre-surgery COVID swab is scheduled on: Friday 05/19/2019.  Drive up in front of [address] any time 8:00-10:30 am and remain in your vehicle.  Your surgery is scheduled on: Tuesday 05/23/2019 Report to Same Day Surgery 2nd floor [facility] Entrance-take elevator on left to 2nd floor.  Check in with surgery information desk.) To find out your arrival time, call [phone] 1:00-3:00 PM on Monday 05/22/2019  Remember: Instructions that are not followed completely may result in serious medical risk, up to and including death, or upon the discretion of your surgeon and anesthesiologist your surgery may need to be rescheduled.    __x__ 1. Do not eat food (including mints, candies, chewing gum) after midnight the night before your procedure. You may drink clear liquids up to 2 hours before you are scheduled to arrive at the hospital for your procedure.  Do not drink anything within 2 hours of your scheduled arrival to the hospital.  Approved clear liquids:  --Water or Apple juice without pulp  --Clear carbohydrate beverage such as Gatorade or Powerade  --Black Coffee or Clear Tea (No milk, no creamers, do not add anything to the coffee or tea)  Finish your provided Ensure clear carbohydrate drink 2 hours before your scheduled arrival time.    __x__ 2. No Alcohol for 24 hours before or after surgery.   __x__ 3. No Smoking or e-cigarettes for 24 hours before surgery.  Do not use any chewable tobacco products for at least 6 hours before surgery.   __x__ 4. Notify your doctor if there is any change in your medical condition (cold, fever, infections).   __x__ 5. On the morning of surgery brush your teeth with toothpaste and water.  You may rinse your mouth with mouthwash if you wish.  Do not swallow any toothpaste or mouthwash.  Please read over the following fact sheets that you were given:   [facility] Preparing for Surgery and/or MRSA Information    __x__ Use CHG  Soap as directed on instruction sheet.   Do not wear jewelry, make-up, hairpins, clips or nail polish on the day of surgery.  Do not wear lotions, powders, deodorant, or perfumes.   Do not shave below the face/neck 48 hours prior to surgery.   Do not bring valuables to the hospital.    [facility] is not responsible for any belongings or valuables.               Contacts, dentures or bridgework may not be worn into surgery.  For patients admitted to the hospital, discharge time is determined by your treatment team.  For patients discharged on the day of surgery, you will NOT be permitted to drive yourself home.  You must have a responsible adult with you for 24 hours after surgery.  __x__ Take these medicines on the morning of surgery with a SMALL SIP OF WATER:  1. Gabapentin (Neurontin)  2. Metoprolol (Toprol)  3. Pantoprazole (Protonix)  4. Oxycodone if needed  5. Hydroxyzine if needed  Skip your Chlorthalidone (Hygroton) and Sucralfate (Carafate) only on the morning of surgery.  You do not need to stop these medicines before.  ____ Follow recommendations from Cardiologist, Pulmonologist or PCP regarding stopping any blood thinners such as Aspirin, Coumadin, Plavix, Eliquis, Effient, Pradaxa, and Pletal.  __x__ STARTING TODAY: Do not take any Anti-inflammatories such as Advil, Ibuprofen, Motrin, Aleve, Naproxen, Naprosyn, BC/Goodies powders or aspirin products.   __x__ STARTING TODAY: Do not take any  over the counter supplements until after surgery.

## 2019-05-11 ENCOUNTER — Ambulatory Visit: Payer: Medicaid Other | Admitting: Anesthesiology

## 2019-05-11 ENCOUNTER — Encounter: Payer: Self-pay | Admitting: *Deleted

## 2019-05-11 ENCOUNTER — Encounter
Admission: RE | Disposition: A | Discharge status: Home / Self Care | Payer: Self-pay | Source: Ambulatory Visit | Attending: Gastroenterology

## 2019-05-11 ENCOUNTER — Ambulatory Visit
Admission: RE | Admit: 2019-05-11 | Discharge: 2019-05-11 | Disposition: A | Discharge status: Home / Self Care | Payer: Medicaid Other | Source: Ambulatory Visit | Attending: Gastroenterology | Admitting: Gastroenterology

## 2019-05-11 DIAGNOSIS — I8500 Esophageal varices without bleeding: Secondary | ICD-10-CM | POA: Diagnosis not present

## 2019-05-11 DIAGNOSIS — R7989 Other specified abnormal findings of blood chemistry: Secondary | ICD-10-CM

## 2019-05-11 DIAGNOSIS — R933 Abnormal findings on diagnostic imaging of other parts of digestive tract: Secondary | ICD-10-CM

## 2019-05-11 DIAGNOSIS — Z538 Procedure and treatment not carried out for other reasons: Secondary | ICD-10-CM | POA: Diagnosis not present

## 2019-05-11 DIAGNOSIS — R945 Abnormal results of liver function studies: Secondary | ICD-10-CM

## 2019-05-11 LAB — BASIC METABOLIC PANEL
Anion gap: 9 (ref 5–15)
BUN: 9 mg/dL (ref 6–20)
CO2: 31 mmol/L (ref 22–32)
Calcium: 8.3 mg/dL — ABNORMAL LOW (ref 8.9–10.3)
Chloride: 92 mmol/L — ABNORMAL LOW (ref 98–111)
Creatinine, Ser: 0.34 mg/dL — ABNORMAL LOW (ref 0.44–1.00)
GFR calc Af Amer: >60 mL/min (ref >60)
GFR calc non Af Amer: >60 mL/min (ref >60)
Glucose, Bld: 117 mg/dL — ABNORMAL HIGH (ref 70–99)
Potassium: 2.5 mmol/L — CL (ref 3.5–5.1)
Sodium: 132 mmol/L — ABNORMAL LOW (ref 135–145)

## 2019-05-11 LAB — URINE CULTURE: Culture: <10000 — AB

## 2019-05-11 LAB — POCT PREGNANCY, URINE: Preg Test, Ur: NEGATIVE

## 2019-05-11 SURGERY — ESOPHAGOGASTRODUODENOSCOPY (EGD) WITH PROPOFOL
Anesthesia: General

## 2019-05-11 MED ORDER — SODIUM CHLORIDE 0.9 % IV SOLN: INTRAVENOUS | Status: DC

## 2019-05-11 NOTE — Anesthesia Preprocedure Evaluation (Addendum)
Anesthesia Evaluation  Patient identified by MRN, date of birth, ID band Patient awake    Reviewed: Allergy & Precautions, H&P , NPO status , Patient's Chart, lab work & pertinent test results  Airway        Dental   Pulmonary COPD, Current Smoker and Patient abstained from smoking.,           Cardiovascular hypertension, + angina   Pt reports intermittent chest pain.  She is followed by cardiology for this.  No acute worsening.   Neuro/Psych PSYCHIATRIC DISORDERS Anxiety Depression    GI/Hepatic (+) Cirrhosis     substance abuse  alcohol use, Hepatitis -  Endo/Other  negative endocrine ROS  Renal/GU negative Renal ROS  negative genitourinary   Musculoskeletal   Abdominal   Peds  Hematology negative hematology ROS (+)   Anesthesia Other Findings Past Medical History: No date: Back injury No date: Cervical cancer (HCC) 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 No date: COPD (chronic obstructive pulmonary disease) (HCC) No date: Family history of adverse reaction to anesthesia     Comment:  PONV No date: GERD (gastroesophageal reflux disease) No date: Hepatitis No date: Hypertension No date: Leg injury No date: Liver cirrhosis ([place])  Past Surgical History: No date: BACK SURGERY No date: LEG SURGERY  BMI    Body Mass Index: 22.68 kg/m      Reproductive/Obstetrics negative OB ROS                           Anesthesia Physical Anesthesia Plan  ASA: IV  Anesthesia Plan:    Post-op Pain Management:    Induction:   PONV Risk Score and Plan:   Airway Management Planned:   Additional Equipment:   Intra-op Plan:   Post-operative Plan:   Informed Consent: I have reviewed the patients History and Physical, chart, labs and discussed the procedure including the risks, benefits and alternatives for the proposed anesthesia with the patient or authorized representative who has indicated  his/her understanding and acceptance.     Dental Advisory Given  Plan Discussed with: Anesthesiologist, CRNA and Surgeon  Anesthesia Plan Comments: (Pt went to PAT yesterday in preparation for upcoming knee replacement surgery, and potassium was noted to be 2.5 at that time.  Pt has not been taking her potassium supplementation and is likely chronically low.  Pt states her potassium prescription is filled at the pharmacy and she was planning to pick it up today.  She has a F/U appt with cardiology tomorrow.  Recommend that her EGD be rescheduled until her electrolytes have been normalized.  Will defer to her specialists regarding her electrolyte management.  Emphasized the need to pick up and take her potassium supplementation today and to F/U with cardiology tomorrow.  Pt expressed understanding and is in agreement with this plan.  KLF)        Anesthesia Quick Evaluation

## 2019-05-11 NOTE — H&P (Signed)
Jonathon Bellows, MD [address] [place], [place], [place], [address] [address], [place], [place], [place], [address] Phone: [phone]  Fax: [phone]  Primary Care Physician:  Volney American, PA-C   Pre-Procedure History & Physical: HPI:  Rhonda Martin is a 43 y.o. female is here for an endoscopy    Past Medical History:  Diagnosis Date   Back injury    Cervical cancer ([place])    Charcot-Marie-Tooth disease    COPD (chronic obstructive pulmonary disease) ([place])    Family history of adverse reaction to anesthesia    PONV   GERD (gastroesophageal reflux disease)    Hepatitis    Hypertension    Leg injury    Liver cirrhosis ([place])     Past Surgical History:  Procedure Laterality Date   BACK SURGERY     LEG SURGERY      Prior to Admission medications   Medication Sig Start Date End Date Taking? Authorizing Provider  Blood Pressure Monitor MISC For automatic blood pressure cuff. 04/06/19  Yes End, Harrell Gave, MD  chlorthalidone (HYGROTON) 25 MG tablet Take 25 mg by mouth daily.   Yes [provider]  folic acid (FOLVITE) 1 MG tablet TAKE 1 TABLET BY MOUTH EVERY DAY 04/27/19  Yes Earlie Server, MD  gabapentin (NEURONTIN) 300 MG capsule TAKE 1-3 CAPSULES DAILY AS NEEDED 04/27/19  Yes Volney American, PA-C  gabapentin (NEURONTIN) 300 MG capsule Take 300 mg by mouth 3 (three) times daily as needed (pain).   Yes [provider]  hydrOXYzine (ATARAX/VISTARIL) 25 MG tablet Take 1 tablet (25 mg total) by mouth 3 (three) times daily as needed. 02/16/19  Yes Volney American, PA-C  metoprolol succinate (TOPROL-XL) 25 MG 24 hr tablet TAKE 1 TABLET BY MOUTH EVERY DAY Patient taking differently: Take 25 mg by mouth daily.  04/27/19  Yes Volney American, PA-C  Multiple Vitamin (MULTIVITAMIN WITH MINERALS) TABS tablet Take 1 tablet by mouth daily. 01/27/19  Yes Epifanio Lesches, MD  nortriptyline (PAMELOR) 25 MG capsule Take  1 capsule (25 mg total) by mouth at bedtime. Patient taking differently: Take 25 mg by mouth at bedtime as needed for sleep.  03/29/19  Yes Volney American, PA-C  oxycodone (OXY-IR) 5 MG capsule Take 5 mg by mouth 3 (three) times daily as needed for pain.   Yes [provider]  pantoprazole (PROTONIX) 40 MG tablet Take 1 tablet (40 mg total) by mouth daily. 11/30/18  Yes Volney American, PA-C  potassium chloride (KLOR-CON) 10 MEQ tablet Take 2 tablets (20 mEq total) by mouth daily. 03/31/19  Yes Volney American, PA-C  sucralfate (CARAFATE) 1 g tablet Take 1 tablet (1 g total) by mouth 4 (four) times daily -  with meals and at bedtime. 11/30/18  Yes Volney American, PA-C  thiamine 100 MG tablet Take 1 tablet (100 mg total) by mouth daily. 01/27/19  Yes Epifanio Lesches, MD  triamcinolone cream (KENALOG) 0.1 % Apply 1 application topically 2 (two) times daily. Patient taking differently: Apply 1 application topically 2 (two) times daily as needed (pain).  03/28/19  Yes Volney American, PA-C    Allergies as of 05/01/2019   (No Known Allergies)    Family History  Problem Relation Age of Onset   Diabetes Mother    Hypertension Mother    Cancer Father        unknown what kind of cancer    Hypertension Sister  Hypertension Brother    Heart attack Brother 72    Social History   Socioeconomic History   Marital status: Significant Other    Spouse name: Not on file   Number of children: Not on file   Years of education: Not on file   Highest education level: Not on file  Occupational History   Not on file  Social Needs   Financial resource strain: Not hard at all   Food insecurity    Worry: Never true    Inability: Never true   Transportation needs    Medical: No    Non-medical: Not on file  Tobacco Use   Smoking status: Current Every Day Smoker    Packs/day: 1.00    Years: 30.00    Pack years: 30.00    Types:  Cigarettes   Smokeless tobacco: Never Used  Substance and Sexual Activity   Alcohol use: Yes    Alcohol/week: 10.0 standard drinks    Types: 10 Cans of beer per week   Drug use: Not Currently   Sexual activity: Not Currently    Birth control/protection: I.U.D.  Lifestyle   Physical activity    Days per week: 7 days    Minutes per session: 30 min   Stress: Very much  Relationships   Social connections    Talks on phone: More than three times a week    Gets together: Three times a week    Attends religious service: More than 4 times per year    Active member of club or organization: No    Attends meetings of clubs or organizations: Never    Relationship status: Living with partner   Intimate partner violence    Fear of current or ex partner: No    Emotionally abused: No    Physically abused: No    Forced sexual activity: No  Other Topics Concern   Not on file  Social History Narrative   Not on file    Review of Systems: See HPI, otherwise negative ROS  Physical Exam: BP (!) 92/56    Pulse 83    Temp 98.3 F (36.8 C) (Temporal)    Resp 16    Ht 5\' 2"  (1.575 m)    Wt 56.2 kg    SpO2 99%    BMI 22.68 kg/m  General:   Alert,  pleasant and cooperative in NAD Head:  Normocephalic and atraumatic. Neck:  Supple; no masses or thyromegaly. Lungs:  Clear throughout to auscultation, normal respiratory effort.    Heart:  +S1, +S2, Regular rate and rhythm, No edema. Abdomen:  Soft, nontender and nondistended. Normal bowel sounds, without guarding, and without rebound.   Neurologic:  Alert and  oriented x4;  grossly normal neurologically.  Impression/Plan: Rhonda Martin is here for an endoscopy  to be performed for  evaluation of esophageal varices    Risks, benefits, limitations, and alternatives regarding endoscopy have been reviewed with the patient.  Questions have been answered.  All parties agreeable.   Jonathon Bellows, MD  05/11/2019, 9:29 AM

## 2019-05-12 ENCOUNTER — Telehealth: Payer: Self-pay | Admitting: Internal Medicine

## 2019-05-12 ENCOUNTER — Inpatient Hospital Stay: Payer: Medicaid Other | Attending: Oncology

## 2019-05-12 ENCOUNTER — Telehealth: Payer: Medicaid Other | Admitting: Internal Medicine

## 2019-05-12 ENCOUNTER — Other Ambulatory Visit: Payer: Self-pay

## 2019-05-12 NOTE — Progress Notes (Unsigned)
{Choose 1 Note Type (Telehealth Visit or Telephone Visit):[phone]}   Date:  05/12/2019   ID:  GERMANY DODGEN, DOB 1975-10-05, MRN [id number]  {Patient Location:[id number]::"Home"} {Provider Location:[phone]::"Home"}  PCP:  Volney American, PA-C  Cardiologist:  No primary care provider on file. *** Electrophysiologist:  None   Evaluation Performed:  Follow-Up Visit  Chief Complaint:  ***  History of Present Illness:    Rhonda Martin is a 43 y.o. female with history of cirrhosis due to hepatitis C and alcohol abuse, hypertension, Charcot-Marie-Tooth disease, cervical cancer, and anxiety.  I met her in late October for evaluation of sinus tachycardia and atypical chest pain.  She had been started on low-dose metoprolol and felt like her palpitations and chest discomfort were improving.  I encouraged her to quit drinking and to follow-up with GI, particularly since she reported occasional hematochezia (hemoglobin was stable at our last visit).  She presented for EGD yesterday but did not undergo the procedure due to hypokalemia noted on preprocedure labs earlier this week.  The patient {does/does not:200015} have symptoms concerning for COVID-19 infection (fever, chills, cough, or new shortness of breath).    Past Medical History:  Diagnosis Date  . Back injury   . Cervical cancer ([place])   . Charcot-Marie-Tooth disease   . COPD (chronic obstructive pulmonary disease) ([place])   . Family history of adverse reaction to anesthesia    PONV  . GERD (gastroesophageal reflux disease)   . Hepatitis   . Hypertension   . Leg injury   . Liver cirrhosis [facility])    Past Surgical History:  Procedure Laterality Date  . BACK SURGERY    . LEG SURGERY       No outpatient medications have been marked as taking for the 05/12/19 encounter (Appointment) with Talha Iser, Harrell Gave, MD.     Allergies:   Patient has no known allergies.   Social History   Tobacco Use  . Smoking status: Current  Every Day Smoker    Packs/day: 1.00    Years: 30.00    Pack years: 30.00    Types: Cigarettes  . Smokeless tobacco: Never Used  Substance Use Topics  . Alcohol use: Yes    Alcohol/week: 10.0 standard drinks    Types: 10 Cans of beer per week  . Drug use: Not Currently     Family Hx: The patient's family history includes Cancer in her father; Diabetes in her mother; Heart attack (age of onset: 41) in her brother; Hypertension in her brother, mother, and sister.  ROS:   Please see the history of present illness.    *** All other systems reviewed and are negative.   Prior CV studies:   The following studies were reviewed today:  ***  Labs/Other Tests and Data Reviewed:    EKG:  {EKG/Telemetry Strips Reviewed:[id number]}  Recent Labs: 03/29/2019: ALT 27; TSH 1.660 04/06/2019: Magnesium 1.7 05/10/2019: BUN 9; Creatinine, Ser 0.34; Hemoglobin 8.9; Platelets 225; Potassium 2.5; Sodium 132   Recent Lipid Panel No results found for: CHOL, TRIG, HDL, CHOLHDL, LDLCALC, LDLDIRECT  Wt Readings from Last 3 Encounters:  05/11/19 124 lb (56.2 kg)  05/10/19 124 lb 8 oz (56.5 kg)  04/06/19 126 lb 4 oz (57.3 kg)     Objective:    Vital Signs:  There were no vitals taken for this visit.   {[facility] Virtual Exam (Optional):[phone]::"VITAL SIGNS:  reviewed"}  ASSESSMENT & PLAN:    1. ***  COVID-19 Education: The signs and symptoms of COVID-19  were discussed with the patient and how to seek care for testing (follow up with PCP or arrange E-visit).  ***The importance of social distancing was discussed today.  Time:   Today, I have spent *** minutes with the patient with telehealth technology discussing the above problems.     Medication Adjustments/Labs and Tests Ordered: Current medicines are reviewed at length with the patient today.  Concerns regarding medicines are outlined above.   Tests Ordered: No orders of the defined types were placed in this  encounter.   Medication Changes: No orders of the defined types were placed in this encounter.   Follow Up:  {F/U Format:[phone]} {follow up:15908}  Signed, Nelva Bush, MD  05/12/2019 4:04 PM    [facility]

## 2019-05-12 NOTE — Telephone Encounter (Signed)
Virtual Visit Pre-Appointment Phone Call  "(Name), I am calling you today to discuss your upcoming appointment. We are currently trying to limit exposure to the virus that causes COVID-19 by seeing patients at home rather than in the office."  1. "What is the BEST phone number to call the day of the visit?" - include this in appointment notes  2. Do you have or have access to (through a family member/friend) a smartphone with video capability that we can use for your visit?" a. If yes - list this number in appt notes as cell (if different from BEST phone #) and list the appointment type as a VIDEO visit in appointment notes b. If no - list the appointment type as a PHONE visit in appointment notes  3. Confirm consent - "In the setting of the current Covid19 crisis, you are scheduled for a (phone or video) visit with your provider on (date) at (time).  Just as we do with many in-office visits, in order for you to participate in this visit, we must obtain consent.  If you'd like, I can send this to your mychart (if signed up) or email for you to review.  Otherwise, I can obtain your verbal consent now.  All virtual visits are billed to your insurance company just like a normal visit would be.  By agreeing to a virtual visit, we'd like you to understand that the technology does not allow for your provider to perform an examination, and thus may limit your provider's ability to fully assess your condition. If your provider identifies any concerns that need to be evaluated in person, we will make arrangements to do so.  Finally, though the technology is pretty good, we cannot assure that it will always work on either your or our end, and in the setting of a video visit, we may have to convert it to a phone-only visit.  In either situation, we cannot ensure that we have a secure connection.  Are you willing to proceed?" STAFF: Did the patient verbally acknowledge consent to telehealth visit? Document  YES/NO here: YES  4. Advise patient to be prepared - "Two hours prior to your appointment, go ahead and check your blood pressure, pulse, oxygen saturation, and your weight (if you have the equipment to check those) and write them all down. When your visit starts, your provider will ask you for this information. If you have an Apple Watch or Kardia device, please plan to have heart rate information ready on the day of your appointment. Please have a pen and paper handy nearby the day of the visit as well."  5. Give patient instructions for MyChart download to smartphone OR Doximity/Doxy.me as below if video visit (depending on what platform provider is using)  6. Inform patient they will receive a phone call 15 minutes prior to their appointment time (may be from unknown caller ID) so they should be prepared to answer    TELEPHONE CALL NOTE  Rhonda Martin has been deemed a candidate for a follow-up tele-health visit to limit community exposure during the Covid-19 pandemic. I spoke with the patient via phone to ensure availability of phone/video source, confirm preferred email & phone number, and discuss instructions and expectations.  I reminded Rhonda Martin to be prepared with any vital sign and/or heart rhythm information that could potentially be obtained via home monitoring, at the time of her visit. I reminded Rhonda Martin to expect a phone call prior to  her visit.  Rhonda Martin 05/12/2019 3:50 PM   INSTRUCTIONS FOR DOWNLOADING THE MYCHART APP TO SMARTPHONE  - The patient must first make sure to have activated MyChart and know their login information - If Apple, go to [employer] and type in MyChart in the search bar and download the app. If Android, ask patient to go to [employer] and type in [place] in the search bar and download the app. The app is free but as with any other app downloads, their phone may require them to verify saved payment information or Apple/Android  password.  - The patient will need to then log into the app with their MyChart username and password, and select [facility] as their healthcare provider to link the account. When it is time for your visit, go to the MyChart app, find appointments, and click Begin Video Visit. Be sure to Select Allow for your device to access the Microphone and Camera for your visit. You will then be connected, and your provider will be with you shortly.  **If they have any issues connecting, or need assistance please contact MyChart service desk [phone])**  **If using a computer, in order to ensure the best quality for their visit they will need to use either of the following Internet Browsers: [employer], or Google Chrome**  IF USING DOXIMITY or DOXY.ME - The patient will receive a link just prior to their visit by text.     FULL LENGTH CONSENT FOR TELE-HEALTH VISIT   I hereby voluntarily request, consent and authorize [facility] and its employed or contracted physicians, physician assistants, nurse practitioners or other licensed health care professionals (the Practitioner), to provide me with telemedicine health care services (the Services") as deemed necessary by the treating Practitioner. I acknowledge and consent to receive the Services by the Practitioner via telemedicine. I understand that the telemedicine visit will involve communicating with the Practitioner through live audiovisual communication technology and the disclosure of certain medical information by electronic transmission. I acknowledge that I have been given the opportunity to request an in-person assessment or other available alternative prior to the telemedicine visit and am voluntarily participating in the telemedicine visit.  I understand that I have the right to withhold or withdraw my consent to the use of telemedicine in the course of my care at any time, without affecting my right to future care or treatment,  and that the Practitioner or I may terminate the telemedicine visit at any time. I understand that I have the right to inspect all information obtained and/or recorded in the course of the telemedicine visit and may receive copies of available information for a reasonable fee.  I understand that some of the potential risks of receiving the Services via telemedicine include:   Delay or interruption in medical evaluation due to technological equipment failure or disruption;  Information transmitted may not be sufficient (e.g. poor resolution of images) to allow for appropriate medical decision making by the Practitioner; and/or   In rare instances, security protocols could fail, causing a breach of personal health information.  Furthermore, I acknowledge that it is my responsibility to provide information about my medical history, conditions and care that is complete and accurate to the best of my ability. I acknowledge that Practitioner's advice, recommendations, and/or decision may be based on factors not within their control, such as incomplete or inaccurate data provided by me or distortions of diagnostic images or specimens that may result from electronic transmissions. I understand  that the practice of medicine is not an exact science and that Practitioner makes no warranties or guarantees regarding treatment outcomes. I acknowledge that I will receive a copy of this consent concurrently upon execution via email to the email address I last provided but may also request a printed copy by calling the office of [facility].    I understand that my insurance will be billed for this visit.   I have read or had this consent read to me.  I understand the contents of this consent, which adequately explains the benefits and risks of the Services being provided via telemedicine.   I have been provided ample opportunity to ask questions regarding this consent and the Services and have had my questions  answered to my satisfaction.  I give my informed consent for the services to be provided through the use of telemedicine in my medical care  By participating in this telemedicine visit I agree to the above.

## 2019-05-15 ENCOUNTER — Other Ambulatory Visit: Payer: Medicaid Other

## 2019-05-15 ENCOUNTER — Ambulatory Visit: Payer: Self-pay | Admitting: Oncology

## 2019-05-15 ENCOUNTER — Inpatient Hospital Stay (HOSPITAL_BASED_OUTPATIENT_CLINIC_OR_DEPARTMENT_OTHER): Payer: Medicaid Other | Admitting: Oncology

## 2019-05-15 ENCOUNTER — Encounter: Payer: Self-pay | Admitting: Oncology

## 2019-05-15 DIAGNOSIS — E538 Deficiency of other specified B group vitamins: Secondary | ICD-10-CM

## 2019-05-15 DIAGNOSIS — D696 Thrombocytopenia, unspecified: Secondary | ICD-10-CM

## 2019-05-15 DIAGNOSIS — F1010 Alcohol abuse, uncomplicated: Secondary | ICD-10-CM | POA: Diagnosis not present

## 2019-05-15 DIAGNOSIS — D649 Anemia, unspecified: Secondary | ICD-10-CM | POA: Diagnosis not present

## 2019-05-15 DIAGNOSIS — R238 Other skin changes: Secondary | ICD-10-CM

## 2019-05-15 DIAGNOSIS — R233 Spontaneous ecchymoses: Secondary | ICD-10-CM

## 2019-05-15 NOTE — Progress Notes (Deleted)
Cardiology Office Note    Date:  05/15/2019   ID:  Rhonda Martin, DOB [birth year], MRN [id number]  PCP:  Volney American, PA-C  Cardiologist:  Nelva Bush, MD  Electrophysiologist:  None   Chief Complaint: Follow-up  History of Present Illness:   Rhonda Martin is a 43 y.o. 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  Prior echo in 02/2019 for lower extremity edema showed an EF of 55 to [id number], normal diastolic function, normal RV systolic function and cavity size, mild aortic valve insufficiency.  Lower extremity ultrasound was negative for DVT.  Symptoms were felt to be likely secondary to the patient's underlying liver disease and hypoalbuminemia.  Patient was evaluated initially by cardiology on 04/06/2019 for sinus tachycardia with reported heart rates up to 150 bpm at rest along with intermittent sharp/stabbing chest pain that seemed to correlate with particularly high heart rates.  Pain would last for a few seconds at a time.  She had been started on metoprolol by PCP with significant improvement in tachypalpitations and chest pain.  Initially, she noted some fatigue and lightheadedness after starting metoprolol though this seemed to improve the longer she was on the medication.  Patient's sinus tachycardia were felt to be in the setting of the patient's underlying comorbid conditions including cirrhosis, chronic anemia, and electrolyte deficiencies.  It was recommended she continue low-dose metoprolol.  Cardiac monitoring was not indicated at that time.  ***   Labs: 05/2019 - INR 1.4, Hgb 8.9, PLT 225, potassium 2.5, sodium 132, BUN 9, serum creatinine 0.34 03/2019 - magnesium 1.7, TSH normal, albumin 2.9, alkaline phosphatase 193, AST 143, ALT 27  Past Medical History:  Diagnosis  Date  . Back injury   . Cervical cancer ([place])   . Charcot-Marie-Tooth disease   . COPD (chronic obstructive pulmonary disease) ([place])   . Family history of adverse reaction to anesthesia    PONV  . GERD (gastroesophageal reflux disease)   . Hepatitis   . Hypertension   . Leg injury   . Liver cirrhosis [facility])     Past Surgical History:  Procedure Laterality Date  . BACK SURGERY    . LEG SURGERY      Current Medications: No outpatient medications have been marked as taking for the 05/16/19 encounter (Appointment) with Rise Mu, PA-C.    Allergies:   Patient has no known allergies.   Social History   Socioeconomic History  . Marital status: Significant Other    Spouse name: Not on file  . Number of children: Not on file  . Years of education: Not on file  . Highest education level: Not on file  Occupational History  . Not on file  Social Needs  . Financial resource strain: Not hard at all  . Food insecurity    Worry: Never true    Inability: Never true  . Transportation needs    Medical: No    Non-medical: Not on file  Tobacco Use  . Smoking status: Current Every Day Smoker    Packs/day: 1.00    Years: 30.00    Pack years: 30.00    Types: Cigarettes  . Smokeless tobacco: Never Used  Substance and Sexual Activity  . Alcohol use: Yes    Alcohol/week: 10.0 standard drinks    Types: 10 Cans of beer per week  .  Drug use: Not Currently  . Sexual activity: Not Currently    Birth control/protection: I.U.D.  Lifestyle  . Physical activity    Days per week: 7 days    Minutes per session: 30 min  . Stress: Very much  Relationships  . Social connections    Talks on phone: More than three times a week    Gets together: Three times a week    Attends religious service: More than 4 times per year    Active member of club or organization: No    Attends meetings of clubs or organizations: Never    Relationship status: Living with partner  Other Topics Concern  .  Not on file  Social History Narrative  . Not on file     Family History:  The patient's family history includes Cancer in her father; Diabetes in her mother; Heart attack (age of onset: 96) in her brother; Hypertension in her brother, mother, and sister.  ROS:   ROS   EKGs/Labs/Other Studies Reviewed:    Studies reviewed were summarized above. The additional studies were reviewed today:  2D Echo 02/07/2019:  1. The left ventricle has normal systolic function, with an ejection fraction of 55-60%. The cavity size was normal. Left ventricular diastolic parameters were normal.  2. The right ventricle has normal systolic function. The cavity was normal. There is no increase in right ventricular wall thickness.  3. The mitral valve is grossly normal.  4. The aortic valve is tricuspid. Aortic valve regurgitation is mild by color flow Doppler.  5. The aorta is normal unless otherwise noted.  6. The interatrial septum was not assessed.   EKG:  EKG is ordered today.  The EKG ordered today demonstrates ***  Recent Labs: 03/29/2019: ALT 27; TSH 1.660 04/06/2019: Magnesium 1.7 05/10/2019: BUN 9; Creatinine, Ser 0.34; Hemoglobin 8.9; Platelets 225; Potassium 2.5; Sodium 132  Recent Lipid Panel No results found for: CHOL, TRIG, HDL, CHOLHDL, VLDL, LDLCALC, LDLDIRECT  PHYSICAL EXAM:    VS:  There were no vitals taken for this visit.  BMI: There is no height or weight on file to calculate BMI.  Physical Exam  Wt Readings from Last 3 Encounters:  05/11/19 124 lb (56.2 kg)  05/10/19 124 lb 8 oz (56.5 kg)  04/06/19 126 lb 4 oz (57.3 kg)     ASSESSMENT & PLAN:   1. ***  Disposition: F/u with Dr. Saunders Revel or an APP in ***.   Medication Adjustments/Labs and Tests Ordered: Current medicines are reviewed at length with the patient today.  Concerns regarding medicines are outlined above. Medication changes, Labs and Tests ordered today are summarized above and listed in the Patient Instructions  accessible in Encounters.   Signed, Christell Faith, PA-C 05/15/2019 7:21 AM     [facility] [address] [place], [place] [address] [phone]

## 2019-05-15 NOTE — Progress Notes (Signed)
Patient verified using two identifiers for virtual visit via telephone today.   Patient did not have labs drawn for this virtual visit but there is lab results, including CBC from 05/10/19.

## 2019-05-15 NOTE — Progress Notes (Signed)
HEMATOLOGY-ONCOLOGY TeleHEALTH VISIT PROGRESS NOTE  I connected with Rhonda Martin on 05/15/19 at 10:15 AM EST by video enabled telemedicine visit and verified that I am speaking with the correct person using two identifiers. I discussed the limitations, risks, security and privacy concerns of performing an evaluation and management service by telemedicine and the availability of in-person appointments. I also discussed with the patient that there may be a patient responsible charge related to this service. The patient expressed understanding and agreed to proceed.   Other persons participating in the visit and their role in the encounter:  None  Patient's location: Home  Provider's location: office Chief Complaint: Anemia and thrombocytopenia   INTERVAL HISTORY Rhonda Martin is a 43 y.o. female who has above history reviewed by me today presents for follow up visit for management of anemia and thrombocytopenia Problems and complaints are listed below:  I attempted to connect the patient for visual enabled telehealth visit via [place].  Due to the technical difficulties with video,  Patient was transitioned to audio only visit. Patient reports having severe knee pain and was seen by orthopedic physician Dr. Rudene Christians on 04/19/2019 was diagnosed with osteoarthritis of right knee, valgus deformity of the knee.  Knee arthroplasty was offered.  Patient had preoperative blood work done which showed severe hypokalemia with a potassium level 2.5.  Patient reports that she is now taking potassium pills and she plans to have potassium rechecked.  Patient has endoscopy planned on 05/11/2019.  Due to hypokalemia, procedure was canceled. Patient continues to drink alcohol daily.  Reports positive easy bruising.  No acute bleeding.  Review of Systems  Constitutional: Positive for fatigue. Negative for appetite change, chills and fever.  HENT:   Negative for hearing loss and voice change.   Eyes: Negative for  eye problems.  Respiratory: Negative for chest tightness and cough.   Cardiovascular: Negative for chest pain.  Gastrointestinal: Negative for abdominal distention, abdominal pain, blood in stool, nausea and vomiting.  Endocrine: Negative for hot flashes.  Genitourinary: Negative for difficulty urinating and frequency.   Musculoskeletal: Positive for arthralgias.  Skin: Negative for itching and rash.  Neurological: Negative for extremity weakness.  Hematological: Negative for adenopathy. Bruises/bleeds easily.  Psychiatric/Behavioral: Negative for confusion.    Past Medical History:  Diagnosis Date  . Back injury   . Cervical cancer ([place])   . Charcot-Marie-Tooth disease   . COPD (chronic obstructive pulmonary disease) ([place])   . Family history of adverse reaction to anesthesia    PONV  . GERD (gastroesophageal reflux disease)   . Hepatitis   . Hypertension   . Leg injury   . Liver cirrhosis [facility])    Past Surgical History:  Procedure Laterality Date  . BACK SURGERY    . LEG SURGERY      Family History  Problem Relation Age of Onset  . Diabetes Mother   . Hypertension Mother   . Cancer Father        unknown what kind of cancer   . Hypertension Sister   . Hypertension Brother   . Heart attack Brother 49    Social History   Socioeconomic History  . Marital status: Significant Other    Spouse name: Not on file  . Number of children: Not on file  . Years of education: Not on file  . Highest education level: Not on file  Occupational History  . Not on file  Social Needs  . Financial resource strain: Not hard at  all  . Food insecurity    Worry: Never true    Inability: Never true  . Transportation needs    Medical: No    Non-medical: Not on file  Tobacco Use  . Smoking status: Current Every Day Smoker    Packs/day: 1.00    Years: 30.00    Pack years: 30.00    Types: Cigarettes  . Smokeless tobacco: Never Used  Substance and Sexual Activity  . Alcohol use: Yes     Alcohol/week: 10.0 standard drinks    Types: 10 Cans of beer per week  . Drug use: Not Currently  . Sexual activity: Not Currently    Birth control/protection: I.U.D.  Lifestyle  . Physical activity    Days per week: 7 days    Minutes per session: 30 min  . Stress: Very much  Relationships  . Social connections    Talks on phone: More than three times a week    Gets together: Three times a week    Attends religious service: More than 4 times per year    Active member of club or organization: No    Attends meetings of clubs or organizations: Never    Relationship status: Living with partner  . Intimate partner violence    Fear of current or ex partner: No    Emotionally abused: No    Physically abused: No    Forced sexual activity: No  Other Topics Concern  . Not on file  Social History Narrative  . Not on file    Current Outpatient Medications on File Prior to Visit  Medication Sig Dispense Refill  . Blood Pressure Monitor MISC For automatic blood pressure cuff. 1 each 0  . chlorthalidone (HYGROTON) 25 MG tablet Take 25 mg by mouth daily.    . folic acid (FOLVITE) 1 MG tablet TAKE 1 TABLET BY MOUTH EVERY DAY 90 tablet 0  . gabapentin (NEURONTIN) 300 MG capsule Take 300 mg by mouth 3 (three) times daily as needed (pain).    . hydrOXYzine (ATARAX/VISTARIL) 25 MG tablet Take 1 tablet (25 mg total) by mouth 3 (three) times daily as needed. 90 tablet 1  . metoprolol succinate (TOPROL-XL) 25 MG 24 hr tablet TAKE 1 TABLET BY MOUTH EVERY DAY (Patient taking differently: Take 25 mg by mouth daily. ) 30 tablet 0  . Multiple Vitamin (MULTIVITAMIN WITH MINERALS) TABS tablet Take 1 tablet by mouth daily. 30 tablet 0  . oxycodone (OXY-IR) 5 MG capsule Take 5 mg by mouth 3 (three) times daily as needed for pain.    . pantoprazole (PROTONIX) 40 MG tablet Take 1 tablet (40 mg total) by mouth daily. 30 tablet 3  . potassium chloride (KLOR-CON) 10 MEQ tablet Take 2 tablets (20 mEq total) by  mouth daily. (Patient taking differently: Take 20 mEq by mouth 3 (three) times daily. ) 20 tablet 0  . sucralfate (CARAFATE) 1 g tablet Take 1 tablet (1 g total) by mouth 4 (four) times daily -  with meals and at bedtime. 120 tablet 1  . thiamine 100 MG tablet Take 1 tablet (100 mg total) by mouth daily. 30 tablet 0  . triamcinolone cream (KENALOG) 0.1 % Apply 1 application topically 2 (two) times daily. (Patient taking differently: Apply 1 application topically 2 (two) times daily as needed (pain). ) 30 g 0  . gabapentin (NEURONTIN) 300 MG capsule TAKE 1-3 CAPSULES DAILY AS NEEDED (Patient not taking: Reported on 05/15/2019) 90 capsule 0  . nortriptyline (PAMELOR)  25 MG capsule Take 1 capsule (25 mg total) by mouth at bedtime. (Patient not taking: Reported on 05/15/2019) 30 capsule 0   No current facility-administered medications on file prior to visit.     No Known Allergies     Observations/Objective: Today's Vitals   05/15/19 1033  PainSc: 0-No pain   There is no height or weight on file to calculate BMI.  Physical Exam  Constitutional: No distress.    CBC    Component Value Date/Time   WBC 7.5 05/10/2019 0921   RBC 3.52 (L) 05/10/2019 0921   HGB 8.9 (L) 05/10/2019 0921   HGB 8.4 (L) 03/29/2019 1616   HCT 27.9 (L) 05/10/2019 0921   HCT 25.8 (L) 03/29/2019 1616   PLT 225 05/10/2019 0921   PLT 102 (L) 03/29/2019 1616   MCV 79.3 (L) 05/10/2019 0921   MCV 82 03/29/2019 1616   MCH 25.3 (L) 05/10/2019 0921   MCHC 31.9 05/10/2019 0921   RDW 17.5 (H) 05/10/2019 0921   RDW 20.7 (H) 03/29/2019 1616   LYMPHSABS 0.9 05/10/2019 0921   LYMPHSABS 0.9 03/29/2019 1616   MONOABS 0.7 05/10/2019 0921   EOSABS 0.2 05/10/2019 0921   EOSABS 0.1 03/29/2019 1616   BASOSABS 0.1 05/10/2019 0921   BASOSABS 0.1 03/29/2019 1616    CMP     Component Value Date/Time   NA 132 (L) 05/10/2019 0921   NA 136 03/29/2019 1616   K 2.5 (LL) 05/10/2019 0921   CL 92 (L) 05/10/2019 0921   CO2 31  05/10/2019 0921   GLUCOSE 117 (H) 05/10/2019 0921   BUN 9 05/10/2019 0921   BUN 4 (L) 03/29/2019 1616   CREATININE 0.34 (L) 05/10/2019 0921   CALCIUM 8.3 (L) 05/10/2019 0921   PROT 7.1 03/29/2019 1616   ALBUMIN 2.9 (L) 03/29/2019 1616   AST 143 (H) 03/29/2019 1616   ALT 27 03/29/2019 1616   ALKPHOS 193 (H) 03/29/2019 1616   BILITOT 2.8 (H) 03/29/2019 1616   GFRNONAA >60 05/10/2019 0921   GFRAA >60 05/10/2019 0921     Assessment and Plan: 1. Thrombocytopenia ([place])   2. Anemia, unspecified type   3. Folate deficiency   4. Alcohol abuse   5. Easy bruising     Labs are reviewed and discussed with patient in details. Hypokalemia, follow-up with primary care provider and orthopedic surgery for repeating potassium.  Anemia, hemoglobin 8.9, MCV 79.3.  Microcytosis is a new problem for her.  She is previously macrocytic due to folate deficiency, has been on folic acid supplementation. Suspect ongoing GI blood loss due to cirrhosis/varices. Encourage patient to continue follow-up with gastroenterology and have gastroenterology work-up for potential blood loss/varice evaluation. Continue folic acid supplementation.  Alcohol cessation was discussed with patient again. Close monitor blood work. Recommend patient to follow-up with repeat blood work.  In 4 to 6 weeks   Follow Up Instructions: 4 to 6 weeks   I discussed the assessment and treatment plan with the patient. The patient was provided an opportunity to ask questions and all were answered. The patient agreed with the plan and demonstrated an understanding of the instructions.  The patient was advised to call back or seek an in-person evaluation if the symptoms worsen or if the condition fails to improve as anticipated.     Earlie Server, MD 05/15/2019 7:27 PM

## 2019-05-16 ENCOUNTER — Ambulatory Visit: Payer: Medicaid Other | Admitting: Physician Assistant

## 2019-05-19 ENCOUNTER — Other Ambulatory Visit: Admission: RE | Admit: 2019-05-19 | Payer: Medicaid Other | Source: Ambulatory Visit

## 2019-05-23 ENCOUNTER — Encounter: Admission: RE | Payer: Self-pay | Source: Ambulatory Visit

## 2019-05-23 ENCOUNTER — Inpatient Hospital Stay: Admission: RE | Admit: 2019-05-23 | Payer: Medicaid Other | Source: Ambulatory Visit | Admitting: Orthopedic Surgery

## 2019-05-23 SURGERY — ARTHROPLASTY, KNEE, TOTAL
Anesthesia: Choice | Site: Knee | Laterality: Right

## 2019-06-07 DIAGNOSIS — Z79899 Other long term (current) drug therapy: Secondary | ICD-10-CM | POA: Diagnosis not present

## 2019-06-13 ENCOUNTER — Telehealth: Payer: Self-pay | Admitting: Family Medicine

## 2019-06-13 ENCOUNTER — Telehealth: Payer: Self-pay

## 2019-06-13 MED ORDER — METOPROLOL SUCCINATE ER 25 MG PO TB24: 25.0000 mg | ORAL_TABLET | Freq: Every day | ORAL | 0 refills | Status: DC

## 2019-06-13 MED ORDER — CHLORTHALIDONE 25 MG PO TABS: 25.0000 mg | ORAL_TABLET | Freq: Every day | ORAL | 0 refills | Status: DC

## 2019-06-13 MED ORDER — HYDROXYZINE HCL 25 MG PO TABS: 25.0000 mg | ORAL_TABLET | Freq: Three times a day (TID) | ORAL | 1 refills | Status: DC | PRN

## 2019-06-13 MED ORDER — GABAPENTIN 300 MG PO CAPS: ORAL_CAPSULE | ORAL | 0 refills | Status: DC

## 2019-06-13 MED ORDER — SUCRALFATE 1 G PO TABS: 1.0000 g | ORAL_TABLET | Freq: Three times a day (TID) | ORAL | 1 refills | Status: DC

## 2019-06-13 NOTE — Telephone Encounter (Signed)
Pt stated that she will give Dr Tasia Catchings a call for her refill. While speaking with the pt she stated that she had fallen and would like to know if there is any way she could be worked in before the 14. Please advise.

## 2019-06-13 NOTE — Telephone Encounter (Signed)
Pt called requesting to reschedule her endoscopy procedure. Pt states her procedure was canceled last month due to her abnormally low potassium level. Pt states she'd like to proceed with the procedure and wants to know if she'll be okay to do so as she has been taking oral potassium. I explained that I will relay this information to Dr. Vicente Males for recommendations.

## 2019-06-13 NOTE — Telephone Encounter (Signed)
Refills sent, Dr. Tasia Catchings writes the folic acid so she will have to request a refill from that provider. She should not yet be due for that one anyway

## 2019-06-13 NOTE — Telephone Encounter (Signed)
Pt is here stating she needs refills on medication folic acid hydroxyzine chlorthalidone potassium metoprolol, gabapentin carafate, Pt has appointment on the 06/22/2019.Please advise.

## 2019-06-14 ENCOUNTER — Other Ambulatory Visit: Payer: Self-pay

## 2019-06-14 ENCOUNTER — Other Ambulatory Visit: Payer: Self-pay | Admitting: *Deleted

## 2019-06-14 ENCOUNTER — Other Ambulatory Visit: Payer: Self-pay | Admitting: Family Medicine

## 2019-06-14 DIAGNOSIS — R7989 Other specified abnormal findings of blood chemistry: Secondary | ICD-10-CM

## 2019-06-14 DIAGNOSIS — R945 Abnormal results of liver function studies: Secondary | ICD-10-CM

## 2019-06-14 DIAGNOSIS — R899 Unspecified abnormal finding in specimens from other organs, systems and tissues: Secondary | ICD-10-CM

## 2019-06-14 MED ORDER — FOLIC ACID 1 MG PO TABS: 1.0000 mg | ORAL_TABLET | Freq: Every day | ORAL | 0 refills | Status: DC

## 2019-06-14 NOTE — Telephone Encounter (Signed)
Check BMP and if ok proceed with scheduling

## 2019-06-14 NOTE — Telephone Encounter (Signed)
Requested medication (s) are due for refill today yes  Requested medication (s) are on the active medication list Yes  Future visit scheduled Yes  Next OV  06/16/19 Last Potassium 2.5 on 05/10/19   Requested Prescriptions  Pending Prescriptions Disp Refills   potassium chloride (KLOR-CON) 10 MEQ tablet [Pharmacy Med Name: POTASSIUM CL ER 10 MEQ TABLET] 45 tablet 0    Sig: TAKE 1 TABLET (10 MEQ TOTAL) BY MOUTH 3 (THREE) TIMES DAILY      Endocrinology:  Minerals - Potassium Supplementation Failed - 06/14/2019 10:13 AM      Failed - K in normal range and within 360 days    Potassium  Date Value Ref Range Status  05/10/2019 2.5 (LL) 3.5 - 5.1 mmol/L Final    Comment:    CRITICAL RESULT CALLED TO, READ BACK BY AND VERIFIED WITH: HEATHER DAVIS 05/10/2019 AT 1152 HS/DAS           Failed - Cr in normal range and within 360 days    Creatinine, Ser  Date Value Ref Range Status  05/10/2019 0.34 (L) 0.44 - 1.00 mg/dL Final          Passed - Valid encounter within last 12 months    Recent Outpatient Visits           2 months ago Bilateral leg edema   [facility] [place] [place], [place]   2 months ago Bilateral leg edema   [place], [place], [place]   3 months ago Bilateral leg edema   [place], [place]   6 months ago Thrombocytopenia [facility])   [place], [facility], [place]       Future Appointments             In 2 days [name], L[facility], [place], [place]   In 1 week [name], [facility] [place], [place], [facility]

## 2019-06-14 NOTE — Telephone Encounter (Signed)
Pt scheduled for Friday

## 2019-06-14 NOTE — Telephone Encounter (Signed)
Called pt to inform her of Dr. Georgeann Oppenheim recommendation.  Unable to contact, No VM set up Will send MyChart message

## 2019-06-14 NOTE — Telephone Encounter (Signed)
Absolutely, please try and find a sooner appt for her

## 2019-06-15 ENCOUNTER — Ambulatory Visit (INDEPENDENT_AMBULATORY_CARE_PROVIDER_SITE_OTHER): Payer: Medicaid Other | Admitting: Family Medicine

## 2019-06-15 ENCOUNTER — Other Ambulatory Visit: Payer: Self-pay

## 2019-06-15 ENCOUNTER — Encounter: Payer: Self-pay | Admitting: Family Medicine

## 2019-06-15 VITALS — BP 112/67 | HR 99 | Temp 99.3°F | Ht 62.0 in | Wt 127.0 lb

## 2019-06-15 DIAGNOSIS — E876 Hypokalemia: Secondary | ICD-10-CM | POA: Diagnosis not present

## 2019-06-15 DIAGNOSIS — F5101 Primary insomnia: Secondary | ICD-10-CM

## 2019-06-15 MED ORDER — ZOLPIDEM TARTRATE 5 MG PO TABS: 5.0000 mg | ORAL_TABLET | Freq: Every evening | ORAL | 2 refills | Status: DC | PRN

## 2019-06-15 MED ORDER — POTASSIUM CHLORIDE CRYS ER 20 MEQ PO TBCR: 20.0000 meq | EXTENDED_RELEASE_TABLET | Freq: Three times a day (TID) | ORAL | 0 refills | Status: DC

## 2019-06-15 MED ORDER — CHLORTHALIDONE 25 MG PO TABS: 25.0000 mg | ORAL_TABLET | Freq: Every day | ORAL | 1 refills | Status: DC

## 2019-06-15 NOTE — Progress Notes (Signed)
BP 112/67   Pulse 99   Temp 99.3 F (37.4 C) (Oral)   Ht 5\' 2"  (1.575 m)   Wt 127 lb (57.6 kg)   SpO2 95%   BMI 23.23 kg/m    Subjective:    [name], female    DOB: 22-Nov-1975, 44 y.o.   MRN: [id number]  HPI: Rhonda Martin is a 44 y.o. female  Chief Complaint  Patient presents with  . Insomnia    pt would like to try ambien instead of nortriptilyne    Patient here today for insomnia follow up. Nortriptyline made her too groggy, Lorrin Mais seemed to be the only thing that helped. Benadryl does help some but causes RLS. Has failed numerous other prescription agents in the past. Sleeping maybe 1-2 hours nightly.   Currently on 20 meq potassium daily, started running out a few days ago so cut back to 2 tabs daily and just one yesterday as she ran completely out. Significantly hyponatremic on last few lab checks.   Relevant past medical, surgical, family and social history reviewed and updated as indicated. Interim medical history since our last visit reviewed. Allergies and medications reviewed and updated.  Review of Systems  Per HPI unless specifically indicated above     Objective:    BP 112/67   Pulse 99   Temp 99.3 F (37.4 C) (Oral)   Ht 5\' 2"  (1.575 m)   Wt 127 lb (57.6 kg)   SpO2 95%   BMI 23.23 kg/m   Wt Readings from Last 3 Encounters:  06/15/19 127 lb (57.6 kg)  05/11/19 124 lb (56.2 kg)  05/10/19 124 lb 8 oz (56.5 kg)    Physical Exam Vitals and nursing note reviewed.  Constitutional:      Appearance: Normal appearance. She is not ill-appearing.     Comments: Appears chronically ill  HENT:     Head: Atraumatic.  Eyes:     Extraocular Movements: Extraocular movements intact.     Conjunctiva/sclera: Conjunctivae normal.  Cardiovascular:     Rate and Rhythm: Normal rate and regular rhythm.     Heart sounds: Normal heart sounds.  Pulmonary:     Effort: Pulmonary effort is normal.     Breath sounds: Normal breath sounds.   Musculoskeletal:        General: Normal range of motion.     Cervical back: Normal range of motion and neck supple.  Skin:    General: Skin is warm and dry.  Neurological:     Mental Status: She is alert and oriented to person, place, and time.  Psychiatric:        Mood and Affect: Mood normal.        Thought Content: Thought content normal.        Judgment: Judgment normal.     Results for orders placed or performed in visit on [id number]  Basic metabolic panel  Result Value Ref Range   Glucose 86 65 - 99 mg/dL   BUN 7 6 - 24 mg/dL   Creatinine, Ser 0.50 (L) 0.57 - 1.00 mg/dL   GFR calc non Af Amer 119 >59 mL/min/1.73   GFR calc Af Amer 137 >59 mL/min/1.73   BUN/Creatinine Ratio 14 9 - 23   Sodium 133 (L) 134 - 144 mmol/L   Potassium 2.7 (L) 3.5 - 5.2 mmol/L   Chloride 93 (L) 96 - 106 mmol/L   CO2 22 20 - 29 mmol/L   Calcium 8.3 (L)  8.7 - 10.2 mg/dL      Assessment & Plan:   Problem List Items Addressed This Visit      Other   Insomnia - Primary    Restart ambien prn, risks and precautions reviewed at length       Other Visit Diagnoses    Hypokalemia       Medication refilled, recheck labs today. May need to increase dose if still so hypokalemic. Continue working on increasing PO intake of potassium rich products   Relevant Orders   Basic metabolic panel (Completed)       Follow up plan: Return in about 3 months (around 09/13/2019) for Sleep.

## 2019-06-16 ENCOUNTER — Ambulatory Visit: Payer: Medicaid Other | Admitting: Family Medicine

## 2019-06-16 ENCOUNTER — Other Ambulatory Visit: Payer: Self-pay | Admitting: Family Medicine

## 2019-06-16 DIAGNOSIS — E876 Hypokalemia: Secondary | ICD-10-CM

## 2019-06-16 LAB — BASIC METABOLIC PANEL
BUN/Creatinine Ratio: 14 (ref 9–23)
BUN: 7 mg/dL (ref 6–24)
CO2: 22 mmol/L (ref 20–29)
Calcium: 8.3 mg/dL — ABNORMAL LOW (ref 8.7–10.2)
Chloride: 93 mmol/L — ABNORMAL LOW (ref 96–106)
Creatinine, Ser: 0.5 mg/dL — ABNORMAL LOW (ref 0.57–1.00)
GFR calc Af Amer: 137 mL/min/{1.73_m2} (ref >59)
GFR calc non Af Amer: 119 mL/min/{1.73_m2} (ref >59)
Glucose: 86 mg/dL (ref 65–99)
Potassium: 2.7 mmol/L — ABNORMAL LOW (ref 3.5–5.2)
Sodium: 133 mmol/L — ABNORMAL LOW (ref 134–144)

## 2019-06-21 DIAGNOSIS — I10 Essential (primary) hypertension: Secondary | ICD-10-CM | POA: Diagnosis not present

## 2019-06-21 NOTE — Assessment & Plan Note (Signed)
Restart ambien prn, risks and precautions reviewed at length

## 2019-06-22 ENCOUNTER — Ambulatory Visit: Payer: Medicaid Other | Admitting: Family Medicine

## 2019-06-22 ENCOUNTER — Telehealth: Payer: Self-pay

## 2019-06-22 ENCOUNTER — Other Ambulatory Visit: Payer: Self-pay

## 2019-06-22 NOTE — Telephone Encounter (Signed)
Attempted to schedule missed appt  No ans no vm

## 2019-06-23 ENCOUNTER — Other Ambulatory Visit: Payer: Self-pay

## 2019-06-23 ENCOUNTER — Inpatient Hospital Stay: Payer: Medicaid Other | Attending: Oncology

## 2019-06-23 DIAGNOSIS — D696 Thrombocytopenia, unspecified: Secondary | ICD-10-CM | POA: Diagnosis not present

## 2019-06-23 DIAGNOSIS — D500 Iron deficiency anemia secondary to blood loss (chronic): Secondary | ICD-10-CM | POA: Insufficient documentation

## 2019-06-23 DIAGNOSIS — E876 Hypokalemia: Secondary | ICD-10-CM | POA: Diagnosis not present

## 2019-06-23 LAB — CBC WITH DIFFERENTIAL/PLATELET
Abs Immature Granulocytes: 0.01 10*3/uL (ref 0.00–0.07)
Basophils Absolute: 0.1 10*3/uL (ref 0.0–0.1)
Basophils Relative: 1 %
Eosinophils Absolute: 0.1 10*3/uL (ref 0.0–0.5)
Eosinophils Relative: 3 %
HCT: 25.5 % — ABNORMAL LOW (ref 36.0–46.0)
Hemoglobin: 7.7 g/dL — ABNORMAL LOW (ref 12.0–15.0)
Immature Granulocytes: 0 %
Lymphocytes Relative: 17 %
Lymphs Abs: 0.8 10*3/uL (ref 0.7–4.0)
MCH: 23.1 pg — ABNORMAL LOW (ref 26.0–34.0)
MCHC: 30.2 g/dL (ref 30.0–36.0)
MCV: 76.6 fL — ABNORMAL LOW (ref 80.0–100.0)
Monocytes Absolute: 0.7 10*3/uL (ref 0.1–1.0)
Monocytes Relative: 15 %
Neutro Abs: 3.1 10*3/uL (ref 1.7–7.7)
Neutrophils Relative %: 64 %
Platelets: 165 10*3/uL (ref 150–400)
RBC: 3.33 MIL/uL — ABNORMAL LOW (ref 3.87–5.11)
RDW: 22.5 % — ABNORMAL HIGH (ref 11.5–15.5)
WBC: 4.9 10*3/uL (ref 4.0–10.5)
nRBC: 0 % (ref 0.0–0.2)

## 2019-06-23 LAB — IRON AND TIBC
Iron: 14 ug/dL — ABNORMAL LOW (ref 28–170)
Saturation Ratios: 3 % — ABNORMAL LOW (ref 10.4–31.8)
TIBC: 434 ug/dL (ref 250–450)
UIBC: 420 ug/dL

## 2019-06-23 LAB — FERRITIN: Ferritin: 8 ng/mL — ABNORMAL LOW (ref 11–307)

## 2019-06-26 ENCOUNTER — Other Ambulatory Visit: Payer: Self-pay

## 2019-06-26 ENCOUNTER — Encounter: Payer: Self-pay | Admitting: Oncology

## 2019-06-26 ENCOUNTER — Other Ambulatory Visit: Payer: Self-pay | Admitting: Oncology

## 2019-06-26 ENCOUNTER — Inpatient Hospital Stay (HOSPITAL_BASED_OUTPATIENT_CLINIC_OR_DEPARTMENT_OTHER): Payer: Medicaid Other | Admitting: Oncology

## 2019-06-26 DIAGNOSIS — K7469 Other cirrhosis of liver: Secondary | ICD-10-CM

## 2019-06-26 DIAGNOSIS — D509 Iron deficiency anemia, unspecified: Secondary | ICD-10-CM

## 2019-06-26 DIAGNOSIS — K921 Melena: Secondary | ICD-10-CM

## 2019-06-26 DIAGNOSIS — D500 Iron deficiency anemia secondary to blood loss (chronic): Secondary | ICD-10-CM

## 2019-06-26 DIAGNOSIS — E876 Hypokalemia: Secondary | ICD-10-CM

## 2019-06-26 DIAGNOSIS — D696 Thrombocytopenia, unspecified: Secondary | ICD-10-CM

## 2019-06-26 DIAGNOSIS — F1010 Alcohol abuse, uncomplicated: Secondary | ICD-10-CM | POA: Diagnosis not present

## 2019-06-26 DIAGNOSIS — D649 Anemia, unspecified: Secondary | ICD-10-CM

## 2019-06-26 HISTORY — DX: Iron deficiency anemia, unspecified: D50.9

## 2019-06-26 HISTORY — DX: Anemia, unspecified: D64.9

## 2019-06-26 NOTE — Progress Notes (Signed)
Patient verified using two identifiers for virtual visit via telephone today.  Patient does not offer any problems today.  

## 2019-06-26 NOTE — Progress Notes (Signed)
HEMATOLOGY-ONCOLOGY TeleHEALTH VISIT PROGRESS NOTE  I connected with Rhonda Martin on 06/26/19 at  2:15 PM EST by video enabled telemedicine visit and verified that I am speaking with the correct person using two identifiers. I discussed the limitations, risks, security and privacy concerns of performing an evaluation and management service by telemedicine and the availability of in-person appointments. I also discussed with the patient that there may be a patient responsible charge related to this service. The patient expressed understanding and agreed to proceed.   Other persons participating in the visit and their role in the encounter:  None  Patient's location: Home  Provider's location: office Chief Complaint: anemia, thrombocytopenia   INTERVAL HISTORY Rhonda Martin is a 44 y.o. female who has above history reviewed by me today presents for follow up visit for management of anemia, thrombocytopenia Problems and complaints are listed below:  Patient reports feeling very fatigued and tired.  Also reports" swollen abdomen".  Denies any pain today. She previously had endoscopy planned on 05/11/2019 however was canceled due to severe hypokalemia. She drinks alcohol daily.  Denies any acute bleeding.  She has seen blood in the stool previously.  Review of Systems  Constitutional: Positive for fatigue. Negative for appetite change, chills and fever.  HENT:   Negative for hearing loss and voice change.   Eyes: Negative for eye problems.  Respiratory: Negative for chest tightness and cough.   Cardiovascular: Negative for chest pain.  Gastrointestinal: Positive for abdominal distention and blood in stool. Negative for abdominal pain.  Endocrine: Negative for hot flashes.  Genitourinary: Negative for difficulty urinating and frequency.   Musculoskeletal: Negative for arthralgias.  Skin: Negative for itching and rash.  Neurological: Negative for extremity weakness.  Hematological: Negative  for adenopathy.  Psychiatric/Behavioral: Negative for confusion.    Past Medical History:  Diagnosis Date  . Back injury   . Cervical cancer ([place])   . Charcot-Marie-Tooth disease   . COPD (chronic obstructive pulmonary disease) ([place])   . Family history of adverse reaction to anesthesia    PONV  . GERD (gastroesophageal reflux disease)   . Hepatitis   . Hypertension   . IDA (iron deficiency anemia) 06/26/2019  . Leg injury   . Liver cirrhosis ([place])   . Symptomatic anemia 06/26/2019   Past Surgical History:  Procedure Laterality Date  . BACK SURGERY    . LEG SURGERY      Family History  Problem Relation Age of Onset  . Diabetes Mother   . Hypertension Mother   . Cancer Father        unknown what kind of cancer   . Hypertension Sister   . Hypertension Brother   . Heart attack Brother 78    Social History   Socioeconomic History  . Marital status: Significant Other    Spouse name: Not on file  . Number of children: Not on file  . Years of education: Not on file  . Highest education level: Not on file  Occupational History  . Not on file  Tobacco Use  . Smoking status: Current Every Day Smoker    Packs/day: 0.50    Years: 30.00    Pack years: 15.00    Types: Cigarettes  . Smokeless tobacco: Never Used  Substance and Sexual Activity  . Alcohol use: Yes    Alcohol/week: 10.0 standard drinks    Types: 10 Cans of beer per week  . Drug use: Not Currently  . Sexual activity: Not Currently  Birth control/protection: I.U.D.  Other Topics Concern  . Not on file  Social History Narrative  . Not on file   Social Determinants of Health   Financial Resource Strain: Low Risk   . Difficulty of Paying Living Expenses: Not hard at all  Food Insecurity: No Food Insecurity  . Worried About Charity fundraiser in the Last Year: Never true  . Ran Out of Food in the Last Year: Never true  Transportation Needs: Unknown  . Lack of Transportation (Medical): No  . Lack of  Transportation (Non-Medical): Not on file  Physical Activity: Sufficiently Active  . Days of Exercise per Week: 7 days  . Minutes of Exercise per Session: 30 min  Stress: Stress Concern Present  . Feeling of Stress : Very much  Social Connections: Slightly Isolated  . Frequency of Communication with Friends and Family: More than three times a week  . Frequency of Social Gatherings with Friends and Family: Three times a week  . Attends Religious Services: More than 4 times per year  . Active Member of Clubs or Organizations: No  . Attends Archivist Meetings: Never  . Marital Status: Living with partner  Intimate Partner Violence: Not At Risk  . Fear of Current or Ex-Partner: No  . Emotionally Abused: No  . Physically Abused: No  . Sexually Abused: No    Current Outpatient Medications on File Prior to Visit  Medication Sig Dispense Refill  . Blood Pressure Monitor MISC For automatic blood pressure cuff. 1 each 0  . chlorthalidone (HYGROTON) 25 MG tablet Take 1 tablet (25 mg total) by mouth daily. 90 tablet 1  . gabapentin (NEURONTIN) 300 MG capsule Take 1-3 capsules daily as needed 90 capsule 0  . hydrOXYzine (ATARAX/VISTARIL) 25 MG tablet Take 1 tablet (25 mg total) by mouth 3 (three) times daily as needed. 90 tablet 1  . metoprolol succinate (TOPROL-XL) 25 MG 24 hr tablet Take 1 tablet (25 mg total) by mouth daily. 30 tablet 0  . Multiple Vitamin (MULTIVITAMIN WITH MINERALS) TABS tablet Take 1 tablet by mouth daily. 30 tablet 0  . oxycodone (OXY-IR) 5 MG capsule Take 5 mg by mouth 3 (three) times daily as needed for pain.    . pantoprazole (PROTONIX) 40 MG tablet Take 1 tablet (40 mg total) by mouth daily. 30 tablet 3  . potassium chloride SA (KLOR-CON) 20 MEQ tablet Take 1 tablet (20 mEq total) by mouth 3 (three) times daily. 90 tablet 0  . sucralfate (CARAFATE) 1 g tablet Take 1 tablet (1 g total) by mouth 4 (four) times daily -  with meals and at bedtime. 120 tablet 1  .  triamcinolone cream (KENALOG) 0.1 % Apply 1 application topically 2 (two) times daily. (Patient taking differently: Apply 1 application topically 2 (two) times daily as needed (pain). ) 30 g 0  . zolpidem (AMBIEN) 5 MG tablet Take 1 tablet (5 mg total) by mouth at bedtime as needed for sleep. 30 tablet 2  . thiamine 100 MG tablet Take 1 tablet (100 mg total) by mouth daily. (Patient not taking: Reported on 06/26/2019) 30 tablet 0   No current facility-administered medications on file prior to visit.    No Known Allergies     Observations/Objective: Today's Vitals   06/26/19 1250  PainSc: 0-No pain   There is no height or weight on file to calculate BMI.  Physical Exam  Constitutional: She is oriented to person, place, and time. No distress.  Neurological: She is alert and oriented to person, place, and time.  Psychiatric: Mood normal.    CBC    Component Value Date/Time   WBC 4.9 06/23/2019 1314   RBC 3.33 (L) 06/23/2019 1314   HGB 7.7 (L) 06/23/2019 1314   HGB 8.4 (L) 03/29/2019 1616   HCT 25.5 (L) 06/23/2019 1314   HCT 25.8 (L) 03/29/2019 1616   PLT 165 06/23/2019 1314   PLT 102 (L) 03/29/2019 1616   MCV 76.6 (L) 06/23/2019 1314   MCV 82 03/29/2019 1616   MCH 23.1 (L) 06/23/2019 1314   MCHC 30.2 06/23/2019 1314   RDW 22.5 (H) 06/23/2019 1314   RDW 20.7 (H) 03/29/2019 1616   LYMPHSABS 0.8 06/23/2019 1314   LYMPHSABS 0.9 03/29/2019 1616   MONOABS 0.7 06/23/2019 1314   EOSABS 0.1 06/23/2019 1314   EOSABS 0.1 03/29/2019 1616   BASOSABS 0.1 06/23/2019 1314   BASOSABS 0.1 03/29/2019 1616    CMP     Component Value Date/Time   NA 133 (L) 06/15/2019 1703   K 2.7 (L) 06/15/2019 1703   CL 93 (L) 06/15/2019 1703   CO2 22 06/15/2019 1703   GLUCOSE 86 06/15/2019 1703   GLUCOSE 117 (H) 05/10/2019 0921   BUN 7 06/15/2019 1703   CREATININE 0.50 (L) 06/15/2019 1703   CALCIUM 8.3 (L) 06/15/2019 1703   PROT 7.1 03/29/2019 1616   ALBUMIN 2.9 (L) 03/29/2019 1616   AST 143 (H)  03/29/2019 1616   ALT 27 03/29/2019 1616   ALKPHOS 193 (H) 03/29/2019 1616   BILITOT 2.8 (H) 03/29/2019 1616   GFRNONAA 119 06/15/2019 1703   GFRAA 137 06/15/2019 1703     Assessment and Plan: 1. Iron deficiency anemia due to chronic blood loss   2. Alcohol abuse   3. Thrombocytopenia ([place])   4. Other cirrhosis of liver ([place])   5. Blood in stool   6. Hypokalemia     #Iron deficiency anemia, Labs are reviewed and discussed with patient. Acute on chronic anemia, she developed microcytosis with an MCV of 76.6, hemoglobin has dropped 1 g since last visit.  Currently at 7.7.Iron panel shows ferritin of 8,Iron saturation 3 She feels significantly more fatigued and tired. I discussed with patient about the rationale and side effects of 1 unit of PRBC transfusion and she agrees.-Type and screen.  Patient will have blood transfusion on 06/27/2019. Plan IV iron with Venofer 200mg  weekly x 4 doses. Allergy reactions/infusion reaction including anaphylactic reaction discussed with patient. Other side effects include but not limited to high blood pressure, skin rash, weight gain, leg swelling, etc. Patient voices understanding and willing to proceed.  #Etiology of iron deficiency anemia is likely secondary to GI blood loss.  Patient reports a history of seeing blood in stool, though not currently.  Patient has liver cirrhosis and has not had endoscopy/colonoscopy evaluation yet.  Previous scheduled endoscopy appointment was canceled due to severe hypokalemia. I discussed with gastroenterology Dr. Vicente Males via secure chat and the patient will be scheduled to see Dr. Vicente Males for endoscopy. Liver cirrhosis, to establish care with Dr. Vicente Males for further evaluation.  Discussed with patient about alcohol cessation.  #Hypokalemia, this has been a chronic issue for patient.  Most recent blood work on 06/15/2019 showed potassium level of 2.7.  Recommend patient to follow-up with primary care provider for potassium check  and continue oral potassium supplementation. Follow Up Instructions: 5 weeks   I discussed the assessment and treatment plan with the patient. The  patient was provided an opportunity to ask questions and all were answered. The patient agreed with the plan and demonstrated an understanding of the instructions.  The patient was advised to call back or seek an in-person evaluation if the symptoms worsen or if the condition fails to improve as anticipated.     Earlie Server, MD 06/26/2019 6:13 PM

## 2019-06-27 ENCOUNTER — Inpatient Hospital Stay: Payer: Medicaid Other

## 2019-06-27 ENCOUNTER — Other Ambulatory Visit: Payer: Self-pay

## 2019-06-27 DIAGNOSIS — D649 Anemia, unspecified: Secondary | ICD-10-CM

## 2019-06-27 DIAGNOSIS — D500 Iron deficiency anemia secondary to blood loss (chronic): Secondary | ICD-10-CM | POA: Diagnosis not present

## 2019-06-27 LAB — CBC WITH DIFFERENTIAL/PLATELET
Abs Immature Granulocytes: 0.01 10*3/uL (ref 0.00–0.07)
Basophils Absolute: 0.1 10*3/uL (ref 0.0–0.1)
Basophils Relative: 1 %
Eosinophils Absolute: 0.2 10*3/uL (ref 0.0–0.5)
Eosinophils Relative: 4 %
HCT: 24.8 % — ABNORMAL LOW (ref 36.0–46.0)
Hemoglobin: 7.3 g/dL — ABNORMAL LOW (ref 12.0–15.0)
Immature Granulocytes: 0 %
Lymphocytes Relative: 23 %
Lymphs Abs: 1 10*3/uL (ref 0.7–4.0)
MCH: 22.3 pg — ABNORMAL LOW (ref 26.0–34.0)
MCHC: 29.4 g/dL — ABNORMAL LOW (ref 30.0–36.0)
MCV: 75.8 fL — ABNORMAL LOW (ref 80.0–100.0)
Monocytes Absolute: 0.6 10*3/uL (ref 0.1–1.0)
Monocytes Relative: 14 %
Neutro Abs: 2.4 10*3/uL (ref 1.7–7.7)
Neutrophils Relative %: 58 %
Platelets: 180 10*3/uL (ref 150–400)
RBC: 3.27 MIL/uL — ABNORMAL LOW (ref 3.87–5.11)
RDW: 22.2 % — ABNORMAL HIGH (ref 11.5–15.5)
WBC: 4.2 10*3/uL (ref 4.0–10.5)
nRBC: 0 % (ref 0.0–0.2)

## 2019-06-27 MED ORDER — DIPHENHYDRAMINE HCL 25 MG PO CAPS
25.0000 mg | ORAL_CAPSULE | Freq: Once | ORAL | Status: AC
  Administered 2019-06-27: 25 mg via ORAL
  Filled 2019-06-27 (×2): qty 1

## 2019-06-27 MED ORDER — ACETAMINOPHEN 325 MG PO TABS
650.0000 mg | ORAL_TABLET | Freq: Once | ORAL | Status: AC
  Administered 2019-06-27: 11:00:00 650 mg via ORAL
  Filled 2019-06-27: qty 2

## 2019-06-27 MED ORDER — SODIUM CHLORIDE 0.9% IV SOLUTION
250.0000 mL | Freq: Once | INTRAVENOUS | Status: AC
  Administered 2019-06-27: 11:00:00 250 mL via INTRAVENOUS
  Filled 2019-06-27: qty 250

## 2019-06-28 LAB — BPAM RBC
Blood Product Expiration Date: 202102122359
ISSUE DATE / TIME: 202101191107
Unit Type and Rh: 6200

## 2019-06-28 LAB — TYPE AND SCREEN
ABO/RH(D): A POS
Antibody Screen: NEGATIVE
Unit division: 0

## 2019-06-29 LAB — PREPARE RBC (CROSSMATCH)

## 2019-06-30 ENCOUNTER — Other Ambulatory Visit: Payer: Self-pay

## 2019-06-30 ENCOUNTER — Inpatient Hospital Stay: Payer: Medicaid Other

## 2019-06-30 VITALS — BP 98/72 | HR 77 | Temp 97.7°F | Resp 18

## 2019-06-30 DIAGNOSIS — D649 Anemia, unspecified: Secondary | ICD-10-CM

## 2019-06-30 DIAGNOSIS — D500 Iron deficiency anemia secondary to blood loss (chronic): Secondary | ICD-10-CM

## 2019-06-30 MED ORDER — SODIUM CHLORIDE 0.9 % IV SOLN
Freq: Once | INTRAVENOUS | Status: AC
  Filled 2019-06-30: qty 250

## 2019-06-30 MED ORDER — IRON SUCROSE 20 MG/ML IV SOLN
200.0000 mg | Freq: Once | INTRAVENOUS | Status: AC
  Administered 2019-06-30: 200 mg via INTRAVENOUS
  Filled 2019-06-30: qty 10

## 2019-07-04 ENCOUNTER — Encounter: Payer: Self-pay | Admitting: Gastroenterology

## 2019-07-04 ENCOUNTER — Ambulatory Visit: Payer: Medicaid Other | Admitting: Gastroenterology

## 2019-07-05 NOTE — Telephone Encounter (Signed)
Attempted to call and schedule appointment, no vm set up

## 2019-07-06 ENCOUNTER — Encounter: Payer: Self-pay | Admitting: Oncology

## 2019-07-07 ENCOUNTER — Inpatient Hospital Stay: Payer: Medicaid Other

## 2019-07-07 ENCOUNTER — Telehealth: Payer: Self-pay | Admitting: Internal Medicine

## 2019-07-07 ENCOUNTER — Other Ambulatory Visit: Payer: Self-pay

## 2019-07-07 DIAGNOSIS — Z79899 Other long term (current) drug therapy: Secondary | ICD-10-CM | POA: Diagnosis not present

## 2019-07-07 MED ORDER — FERROUS SULFATE 325 (65 FE) MG PO TBEC: 325.0000 mg | DELAYED_RELEASE_TABLET | Freq: Two times a day (BID) | ORAL | 3 refills | Status: DC

## 2019-07-07 NOTE — Telephone Encounter (Signed)
Appt time pending patient approval via mychart.

## 2019-07-07 NOTE — Telephone Encounter (Signed)
Done..  2/5 and 07/21/19 appts cancelled.

## 2019-07-10 ENCOUNTER — Encounter: Payer: Self-pay | Admitting: Emergency Medicine

## 2019-07-10 ENCOUNTER — Other Ambulatory Visit: Payer: Self-pay

## 2019-07-10 ENCOUNTER — Inpatient Hospital Stay
Admission: EM | Admit: 2019-07-10 | Discharge: 2019-07-17 | DRG: 871 | Disposition: A | Discharge status: Home Health | Payer: Medicaid Other | Attending: Internal Medicine | Admitting: Internal Medicine

## 2019-07-10 ENCOUNTER — Telehealth (INDEPENDENT_AMBULATORY_CARE_PROVIDER_SITE_OTHER): Payer: Medicaid Other | Admitting: Internal Medicine

## 2019-07-10 ENCOUNTER — Emergency Department: Payer: Medicaid Other

## 2019-07-10 DIAGNOSIS — R1013 Epigastric pain: Secondary | ICD-10-CM | POA: Diagnosis not present

## 2019-07-10 DIAGNOSIS — Z8249 Family history of ischemic heart disease and other diseases of the circulatory system: Secondary | ICD-10-CM | POA: Diagnosis not present

## 2019-07-10 DIAGNOSIS — J189 Pneumonia, unspecified organism: Secondary | ICD-10-CM | POA: Insufficient documentation

## 2019-07-10 DIAGNOSIS — A419 Sepsis, unspecified organism: Secondary | ICD-10-CM | POA: Diagnosis present

## 2019-07-10 DIAGNOSIS — R600 Localized edema: Secondary | ICD-10-CM | POA: Diagnosis not present

## 2019-07-10 DIAGNOSIS — R079 Chest pain, unspecified: Secondary | ICD-10-CM

## 2019-07-10 DIAGNOSIS — I8510 Secondary esophageal varices without bleeding: Secondary | ICD-10-CM | POA: Diagnosis present

## 2019-07-10 DIAGNOSIS — Z20822 Contact with and (suspected) exposure to covid-19: Secondary | ICD-10-CM | POA: Diagnosis present

## 2019-07-10 DIAGNOSIS — E871 Hypo-osmolality and hyponatremia: Secondary | ICD-10-CM | POA: Diagnosis not present

## 2019-07-10 DIAGNOSIS — G8929 Other chronic pain: Secondary | ICD-10-CM | POA: Diagnosis present

## 2019-07-10 DIAGNOSIS — Z720 Tobacco use: Secondary | ICD-10-CM | POA: Diagnosis present

## 2019-07-10 DIAGNOSIS — D509 Iron deficiency anemia, unspecified: Secondary | ICD-10-CM | POA: Diagnosis present

## 2019-07-10 DIAGNOSIS — K7030 Alcoholic cirrhosis of liver without ascites: Secondary | ICD-10-CM | POA: Diagnosis present

## 2019-07-10 DIAGNOSIS — J440 Chronic obstructive pulmonary disease with acute lower respiratory infection: Secondary | ICD-10-CM | POA: Diagnosis present

## 2019-07-10 DIAGNOSIS — Z79899 Other long term (current) drug therapy: Secondary | ICD-10-CM

## 2019-07-10 DIAGNOSIS — D696 Thrombocytopenia, unspecified: Secondary | ICD-10-CM | POA: Diagnosis not present

## 2019-07-10 DIAGNOSIS — A4189 Other specified sepsis: Principal | ICD-10-CM | POA: Diagnosis present

## 2019-07-10 DIAGNOSIS — Z833 Family history of diabetes mellitus: Secondary | ICD-10-CM

## 2019-07-10 DIAGNOSIS — Z8541 Personal history of malignant neoplasm of cervix uteri: Secondary | ICD-10-CM | POA: Diagnosis not present

## 2019-07-10 DIAGNOSIS — I10 Essential (primary) hypertension: Secondary | ICD-10-CM | POA: Diagnosis present

## 2019-07-10 DIAGNOSIS — R109 Unspecified abdominal pain: Secondary | ICD-10-CM | POA: Diagnosis present

## 2019-07-10 DIAGNOSIS — Z5329 Procedure and treatment not carried out because of patient's decision for other reasons: Secondary | ICD-10-CM

## 2019-07-10 DIAGNOSIS — K7460 Unspecified cirrhosis of liver: Secondary | ICD-10-CM | POA: Diagnosis not present

## 2019-07-10 DIAGNOSIS — F1010 Alcohol abuse, uncomplicated: Secondary | ICD-10-CM | POA: Diagnosis present

## 2019-07-10 DIAGNOSIS — G600 Hereditary motor and sensory neuropathy: Secondary | ICD-10-CM | POA: Diagnosis not present

## 2019-07-10 DIAGNOSIS — D6959 Other secondary thrombocytopenia: Secondary | ICD-10-CM | POA: Diagnosis present

## 2019-07-10 DIAGNOSIS — F419 Anxiety disorder, unspecified: Secondary | ICD-10-CM | POA: Diagnosis present

## 2019-07-10 DIAGNOSIS — R945 Abnormal results of liver function studies: Secondary | ICD-10-CM | POA: Diagnosis not present

## 2019-07-10 DIAGNOSIS — J449 Chronic obstructive pulmonary disease, unspecified: Secondary | ICD-10-CM | POA: Diagnosis present

## 2019-07-10 DIAGNOSIS — K219 Gastro-esophageal reflux disease without esophagitis: Secondary | ICD-10-CM | POA: Diagnosis present

## 2019-07-10 DIAGNOSIS — F17210 Nicotine dependence, cigarettes, uncomplicated: Secondary | ICD-10-CM | POA: Diagnosis present

## 2019-07-10 DIAGNOSIS — F329 Major depressive disorder, single episode, unspecified: Secondary | ICD-10-CM | POA: Diagnosis not present

## 2019-07-10 DIAGNOSIS — J441 Chronic obstructive pulmonary disease with (acute) exacerbation: Secondary | ICD-10-CM | POA: Diagnosis present

## 2019-07-10 DIAGNOSIS — E876 Hypokalemia: Secondary | ICD-10-CM | POA: Diagnosis not present

## 2019-07-10 DIAGNOSIS — R918 Other nonspecific abnormal finding of lung field: Secondary | ICD-10-CM | POA: Diagnosis not present

## 2019-07-10 DIAGNOSIS — R4182 Altered mental status, unspecified: Secondary | ICD-10-CM | POA: Diagnosis not present

## 2019-07-10 DIAGNOSIS — R161 Splenomegaly, not elsewhere classified: Secondary | ICD-10-CM | POA: Diagnosis not present

## 2019-07-10 HISTORY — DX: Sepsis, unspecified organism: A41.9

## 2019-07-10 LAB — CBC
HCT: 33.4 % — ABNORMAL LOW (ref 36.0–46.0)
Hemoglobin: 10.5 g/dL — ABNORMAL LOW (ref 12.0–15.0)
MCH: 22.7 pg — ABNORMAL LOW (ref 26.0–34.0)
MCHC: 31.4 g/dL (ref 30.0–36.0)
MCV: 72.3 fL — ABNORMAL LOW (ref 80.0–100.0)
Platelets: 70 10*3/uL — ABNORMAL LOW (ref 150–400)
RBC: 4.62 MIL/uL (ref 3.87–5.11)
RDW: 24.8 % — ABNORMAL HIGH (ref 11.5–15.5)
WBC: 21.3 10*3/uL — ABNORMAL HIGH (ref 4.0–10.5)
nRBC: 0 % (ref 0.0–0.2)

## 2019-07-10 LAB — COMPREHENSIVE METABOLIC PANEL
ALT: 38 U/L (ref 0–44)
AST: 112 U/L — ABNORMAL HIGH (ref 15–41)
Albumin: 3.9 g/dL (ref 3.5–5.0)
Alkaline Phosphatase: 82 U/L (ref 38–126)
Anion gap: 16 — ABNORMAL HIGH (ref 5–15)
BUN: 25 mg/dL — ABNORMAL HIGH (ref 6–20)
CO2: 27 mmol/L (ref 22–32)
Calcium: 8.6 mg/dL — ABNORMAL LOW (ref 8.9–10.3)
Chloride: 82 mmol/L — ABNORMAL LOW (ref 98–111)
Creatinine, Ser: 0.93 mg/dL (ref 0.44–1.00)
GFR calc Af Amer: >60 mL/min (ref >60)
GFR calc non Af Amer: >60 mL/min (ref >60)
Glucose, Bld: 105 mg/dL — ABNORMAL HIGH (ref 70–99)
Potassium: 2.6 mmol/L — CL (ref 3.5–5.1)
Sodium: 125 mmol/L — ABNORMAL LOW (ref 135–145)
Total Bilirubin: 5.4 mg/dL — ABNORMAL HIGH (ref 0.3–1.2)
Total Protein: 8.2 g/dL — ABNORMAL HIGH (ref 6.5–8.1)

## 2019-07-10 LAB — URINE DRUG SCREEN, QUALITATIVE (ARMC ONLY)
Amphetamines, Ur Screen: NOT DETECTED
Barbiturates, Ur Screen: NOT DETECTED
Benzodiazepine, Ur Scrn: NOT DETECTED
Cannabinoid 50 Ng, Ur ~~LOC~~: POSITIVE — AB
Cocaine Metabolite,Ur ~~LOC~~: NOT DETECTED
MDMA (Ecstasy)Ur Screen: NOT DETECTED
Methadone Scn, Ur: NOT DETECTED
Opiate, Ur Screen: POSITIVE — AB
Phencyclidine (PCP) Ur S: NOT DETECTED
Tricyclic, Ur Screen: POSITIVE — AB

## 2019-07-10 LAB — PROTIME-INR
INR: 1.6 — ABNORMAL HIGH (ref 0.8–1.2)
Prothrombin Time: 18.7 seconds — ABNORMAL HIGH (ref 11.4–15.2)

## 2019-07-10 LAB — RESPIRATORY PANEL BY RT PCR (FLU A&B, COVID)
Influenza A by PCR: NEGATIVE
Influenza B by PCR: NEGATIVE
SARS Coronavirus 2 by RT PCR: NEGATIVE

## 2019-07-10 LAB — URINALYSIS, ROUTINE W REFLEX MICROSCOPIC
Bilirubin Urine: NEGATIVE
Glucose, UA: NEGATIVE mg/dL
Hgb urine dipstick: NEGATIVE
Ketones, ur: NEGATIVE mg/dL
Nitrite: NEGATIVE
Protein, ur: NEGATIVE mg/dL
Specific Gravity, Urine: 1.015 (ref 1.005–1.030)
pH: 5 (ref 5.0–8.0)

## 2019-07-10 LAB — TROPONIN I (HIGH SENSITIVITY)
Troponin I (High Sensitivity): 12 ng/L (ref <18)
Troponin I (High Sensitivity): 11 ng/L (ref <18)

## 2019-07-10 LAB — LACTIC ACID, PLASMA
Lactic Acid, Venous: 1.8 mmol/L (ref 0.5–1.9)
Lactic Acid, Venous: 2.1 mmol/L (ref 0.5–1.9)

## 2019-07-10 LAB — MAGNESIUM: Magnesium: 1.6 mg/dL — ABNORMAL LOW (ref 1.7–2.4)

## 2019-07-10 LAB — AMMONIA: Ammonia: 31 umol/L (ref 9–35)

## 2019-07-10 LAB — SALICYLATE LEVEL: Salicylate Lvl: <7 mg/dL — ABNORMAL LOW (ref 7.0–30.0)

## 2019-07-10 LAB — POCT PREGNANCY, URINE: Preg Test, Ur: NEGATIVE

## 2019-07-10 LAB — ETHANOL: Alcohol, Ethyl (B): <10 mg/dL (ref <10)

## 2019-07-10 LAB — APTT: aPTT: 45 seconds — ABNORMAL HIGH (ref 24–36)

## 2019-07-10 LAB — LIPASE, BLOOD: Lipase: 21 U/L (ref 11–51)

## 2019-07-10 LAB — ACETAMINOPHEN LEVEL: Acetaminophen (Tylenol), Serum: <10 ug/mL — ABNORMAL LOW (ref 10–30)

## 2019-07-10 LAB — PROCALCITONIN: Procalcitonin: 1.82 ng/mL

## 2019-07-10 MED ORDER — NICOTINE 21 MG/24HR TD PT24
21.0000 mg | MEDICATED_PATCH | Freq: Every day | TRANSDERMAL | Status: DC
  Administered 2019-07-11 – 2019-07-12 (×2): 21 mg via TRANSDERMAL
  Filled 2019-07-10 (×5): qty 1

## 2019-07-10 MED ORDER — ALBUTEROL SULFATE (2.5 MG/3ML) 0.083% IN NEBU: 2.5000 mg | INHALATION_SOLUTION | RESPIRATORY_TRACT | Status: DC | PRN

## 2019-07-10 MED ORDER — THIAMINE HCL 100 MG/ML IJ SOLN
100.0000 mg | Freq: Every day | INTRAMUSCULAR | Status: DC
  Administered 2019-07-14: 10:00:00 100 mg via INTRAVENOUS
  Filled 2019-07-10 (×2): qty 2

## 2019-07-10 MED ORDER — SODIUM CHLORIDE 0.9 % IV SOLN
500.0000 mg | INTRAVENOUS | Status: DC
  Administered 2019-07-10 – 2019-07-11 (×2): 500 mg via INTRAVENOUS
  Filled 2019-07-10 (×3): qty 500

## 2019-07-10 MED ORDER — SODIUM CHLORIDE 0.9 % IV SOLN: INTRAVENOUS | Status: DC

## 2019-07-10 MED ORDER — OXYCODONE HCL 5 MG PO TABS
5.0000 mg | ORAL_TABLET | Freq: Four times a day (QID) | ORAL | Status: DC | PRN
  Administered 2019-07-10 – 2019-07-17 (×20): 5 mg via ORAL
  Filled 2019-07-10 (×21): qty 1

## 2019-07-10 MED ORDER — ADULT MULTIVITAMIN W/MINERALS CH
1.0000 | ORAL_TABLET | Freq: Every day | ORAL | Status: DC
  Administered 2019-07-10 – 2019-07-17 (×8): 1 via ORAL
  Filled 2019-07-10 (×8): qty 1

## 2019-07-10 MED ORDER — POTASSIUM CHLORIDE 10 MEQ/100ML IV SOLN
10.0000 meq | INTRAVENOUS | Status: AC
  Administered 2019-07-10 (×4): 10 meq via INTRAVENOUS
  Filled 2019-07-10 (×4): qty 100

## 2019-07-10 MED ORDER — SODIUM CHLORIDE 0.9 % IV SOLN
2.0000 g | Freq: Once | INTRAVENOUS | Status: AC
  Administered 2019-07-10: 14:00:00 2 g via INTRAVENOUS
  Filled 2019-07-10: qty 2

## 2019-07-10 MED ORDER — LORAZEPAM 2 MG/ML IJ SOLN: 0.0000 mg | Freq: Two times a day (BID) | INTRAMUSCULAR | Status: AC

## 2019-07-10 MED ORDER — METRONIDAZOLE IN NACL 5-0.79 MG/ML-% IV SOLN
500.0000 mg | Freq: Once | INTRAVENOUS | Status: AC
  Administered 2019-07-10: 500 mg via INTRAVENOUS
  Filled 2019-07-10: qty 100

## 2019-07-10 MED ORDER — ZOLPIDEM TARTRATE 5 MG PO TABS: 5.0000 mg | ORAL_TABLET | Freq: Every evening | ORAL | Status: DC | PRN

## 2019-07-10 MED ORDER — GABAPENTIN 300 MG PO CAPS
300.0000 mg | ORAL_CAPSULE | Freq: Every day | ORAL | Status: DC | PRN
  Administered 2019-07-11: 600 mg via ORAL
  Filled 2019-07-10: qty 2

## 2019-07-10 MED ORDER — LORAZEPAM 2 MG/ML IJ SOLN: 1.0000 mg | INTRAMUSCULAR | Status: AC | PRN

## 2019-07-10 MED ORDER — LORAZEPAM 2 MG/ML IJ SOLN: 0.0000 mg | Freq: Four times a day (QID) | INTRAMUSCULAR | Status: AC

## 2019-07-10 MED ORDER — DIAZEPAM 5 MG PO TABS
5.0000 mg | ORAL_TABLET | Freq: Once | ORAL | Status: AC
  Administered 2019-07-10: 5 mg via ORAL
  Filled 2019-07-10: qty 1

## 2019-07-10 MED ORDER — VANCOMYCIN HCL IN DEXTROSE 1-5 GM/200ML-% IV SOLN
1000.0000 mg | Freq: Once | INTRAVENOUS | Status: AC
  Administered 2019-07-10: 15:00:00 1000 mg via INTRAVENOUS
  Filled 2019-07-10: qty 200

## 2019-07-10 MED ORDER — THIAMINE HCL 100 MG PO TABS
100.0000 mg | ORAL_TABLET | Freq: Every day | ORAL | Status: DC
  Administered 2019-07-10 – 2019-07-17 (×7): 100 mg via ORAL
  Filled 2019-07-10 (×8): qty 1

## 2019-07-10 MED ORDER — FOLIC ACID 1 MG PO TABS
1.0000 mg | ORAL_TABLET | Freq: Every day | ORAL | Status: DC
  Administered 2019-07-10 – 2019-07-17 (×8): 1 mg via ORAL
  Filled 2019-07-10 (×8): qty 1

## 2019-07-10 MED ORDER — DM-GUAIFENESIN ER 30-600 MG PO TB12
1.0000 | ORAL_TABLET | Freq: Two times a day (BID) | ORAL | Status: DC
  Administered 2019-07-10 – 2019-07-17 (×14): 1 via ORAL
  Filled 2019-07-10 (×16): qty 1

## 2019-07-10 MED ORDER — IOHEXOL 300 MG/ML  SOLN
75.0000 mL | Freq: Once | INTRAMUSCULAR | Status: AC | PRN
  Administered 2019-07-10: 14:00:00 75 mL via INTRAVENOUS

## 2019-07-10 MED ORDER — SODIUM CHLORIDE 0.9 % IV SOLN
1.0000 g | INTRAVENOUS | Status: DC
  Administered 2019-07-10 – 2019-07-12 (×3): 1 g via INTRAVENOUS
  Filled 2019-07-10: qty 1
  Filled 2019-07-10: qty 10
  Filled 2019-07-10: qty 1
  Filled 2019-07-10: qty 10

## 2019-07-10 MED ORDER — POTASSIUM CHLORIDE CRYS ER 20 MEQ PO TBCR
40.0000 meq | EXTENDED_RELEASE_TABLET | Freq: Once | ORAL | Status: AC
  Administered 2019-07-10: 15:00:00 40 meq via ORAL
  Filled 2019-07-10: qty 2

## 2019-07-10 MED ORDER — METOPROLOL SUCCINATE ER 25 MG PO TB24
25.0000 mg | ORAL_TABLET | Freq: Every day | ORAL | Status: DC
  Administered 2019-07-11 – 2019-07-17 (×7): 25 mg via ORAL
  Filled 2019-07-10 (×7): qty 1

## 2019-07-10 MED ORDER — HYDRALAZINE HCL 50 MG PO TABS: 25.0000 mg | ORAL_TABLET | Freq: Three times a day (TID) | ORAL | Status: DC | PRN

## 2019-07-10 MED ORDER — OXYCODONE HCL 5 MG PO TABS
5.0000 mg | ORAL_TABLET | Freq: Once | ORAL | Status: AC
  Administered 2019-07-10: 12:00:00 5 mg via ORAL
  Filled 2019-07-10: qty 1

## 2019-07-10 MED ORDER — PANTOPRAZOLE SODIUM 40 MG PO TBEC
40.0000 mg | DELAYED_RELEASE_TABLET | Freq: Every day | ORAL | Status: DC
  Administered 2019-07-10 – 2019-07-17 (×8): 40 mg via ORAL
  Filled 2019-07-10 (×8): qty 1

## 2019-07-10 MED ORDER — SUCRALFATE 1 G PO TABS
1.0000 g | ORAL_TABLET | Freq: Three times a day (TID) | ORAL | Status: DC
  Administered 2019-07-10 – 2019-07-17 (×26): 1 g via ORAL
  Filled 2019-07-10 (×26): qty 1

## 2019-07-10 MED ORDER — MORPHINE SULFATE (PF) 2 MG/ML IV SOLN
2.0000 mg | INTRAVENOUS | Status: DC | PRN
  Administered 2019-07-12 – 2019-07-17 (×15): 2 mg via INTRAVENOUS
  Filled 2019-07-10 (×17): qty 1

## 2019-07-10 MED ORDER — ADULT MULTIVITAMIN W/MINERALS CH: 1.0000 | ORAL_TABLET | Freq: Every day | ORAL | Status: DC

## 2019-07-10 MED ORDER — MAGNESIUM SULFATE 2 GM/50ML IV SOLN
2.0000 g | Freq: Once | INTRAVENOUS | Status: AC
  Administered 2019-07-10: 16:00:00 2 g via INTRAVENOUS
  Filled 2019-07-10: qty 50

## 2019-07-10 MED ORDER — LORAZEPAM 1 MG PO TABS
1.0000 mg | ORAL_TABLET | ORAL | Status: AC | PRN
  Administered 2019-07-13: 18:00:00 1 mg via ORAL
  Filled 2019-07-10: qty 1

## 2019-07-10 MED ORDER — FERROUS SULFATE 325 (65 FE) MG PO TABS
325.0000 mg | ORAL_TABLET | Freq: Two times a day (BID) | ORAL | Status: DC
  Administered 2019-07-10 – 2019-07-17 (×14): 325 mg via ORAL
  Filled 2019-07-10 (×14): qty 1

## 2019-07-10 MED ORDER — TRIAMCINOLONE ACETONIDE 0.1 % EX CREA
1.0000 "application " | TOPICAL_CREAM | Freq: Two times a day (BID) | CUTANEOUS | Status: DC | PRN
  Filled 2019-07-10: qty 15

## 2019-07-10 MED ORDER — ALPRAZOLAM 0.5 MG PO TABS
0.5000 mg | ORAL_TABLET | Freq: Three times a day (TID) | ORAL | Status: DC | PRN
  Administered 2019-07-10: 0.5 mg via ORAL
  Filled 2019-07-10: qty 1

## 2019-07-10 NOTE — ED Notes (Signed)
Pt given meal tray.

## 2019-07-10 NOTE — Progress Notes (Signed)
PHARMACY -  BRIEF ANTIBIOTIC NOTE   Pharmacy has received consult(s) for vancomycin and cefepime from an ED provider.  The patient's profile has been reviewed for ht/wt/allergies/indication/available labs.    One time order(s) placed for vanc 1 g + cefepime 2 g  Further antibiotics/pharmacy consults should be ordered by admitting physician if indicated.                       Thank you,  Tawnya Crook, PharmD 07/10/2019  1:25 PM

## 2019-07-10 NOTE — Progress Notes (Signed)
CODE SEPSIS - PHARMACY COMMUNICATION  **Broad Spectrum Antibiotics should be administered within 1 hour of Sepsis diagnosis**  Time Code Sepsis Called/Page Received: 1324  Antibiotics Ordered: vanc/cefepime/metronidazole  Time of 1st antibiotic administration: 1336  Additional action taken by pharmacy: NA  If necessary, Name of Provider/Nurse Contacted: NA    Tawnya Crook ,PharmD Clinical Pharmacist  07/10/2019  1:40 PM
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
## 2019-07-10 NOTE — ED Notes (Signed)
Pt's O2 currently showing 84%, Pt placed on 2L [place] at this time Current O2 is 94%. Pt resting in bed.

## 2019-07-10 NOTE — ED Notes (Signed)
Pt back from CT. Pt hooked back up to antibiotics and infusions restarted at this time
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
## 2019-07-10 NOTE — BH Assessment (Signed)
TTS consult was not completed. Patient to be admitted to the medical floor.

## 2019-07-10 NOTE — ED Notes (Signed)
Pt assisted to bathroom with x1 assist and cane. Pt desats to low 80s without oxygen. 2L [place] remains on pt and pt at 98%.

## 2019-07-10 NOTE — ED Notes (Signed)
Pt has bracelet on right arm. Unable to remove.

## 2019-07-10 NOTE — ED Notes (Addendum)
Pt in CT at this time. Will start antibiotics when she returns.
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
## 2019-07-10 NOTE — Progress Notes (Signed)
No show

## 2019-07-11 LAB — BASIC METABOLIC PANEL
Anion gap: 10 (ref 5–15)
BUN: 16 mg/dL (ref 6–20)
CO2: 27 mmol/L (ref 22–32)
Calcium: 7.7 mg/dL — ABNORMAL LOW (ref 8.9–10.3)
Chloride: 97 mmol/L — ABNORMAL LOW (ref 98–111)
Creatinine, Ser: 0.42 mg/dL — ABNORMAL LOW (ref 0.44–1.00)
GFR calc Af Amer: >60 mL/min (ref >60)
GFR calc non Af Amer: >60 mL/min (ref >60)
Glucose, Bld: 104 mg/dL — ABNORMAL HIGH (ref 70–99)
Potassium: 2.6 mmol/L — CL (ref 3.5–5.1)
Sodium: 134 mmol/L — ABNORMAL LOW (ref 135–145)

## 2019-07-11 LAB — CBC
HCT: 28.2 % — ABNORMAL LOW (ref 36.0–46.0)
Hemoglobin: 8.7 g/dL — ABNORMAL LOW (ref 12.0–15.0)
MCH: 23 pg — ABNORMAL LOW (ref 26.0–34.0)
MCHC: 30.9 g/dL (ref 30.0–36.0)
MCV: 74.6 fL — ABNORMAL LOW (ref 80.0–100.0)
Platelets: 57 10*3/uL — ABNORMAL LOW (ref 150–400)
RBC: 3.78 MIL/uL — ABNORMAL LOW (ref 3.87–5.11)
RDW: 25.4 % — ABNORMAL HIGH (ref 11.5–15.5)
WBC: 7.5 10*3/uL (ref 4.0–10.5)
nRBC: 0 % (ref 0.0–0.2)

## 2019-07-11 LAB — PROCALCITONIN: Procalcitonin: 1.16 ng/mL

## 2019-07-11 LAB — STREP PNEUMONIAE URINARY ANTIGEN: Strep Pneumo Urinary Antigen: NEGATIVE

## 2019-07-11 LAB — POTASSIUM: Potassium: 2.8 mmol/L — ABNORMAL LOW (ref 3.5–5.1)

## 2019-07-11 LAB — MAGNESIUM: Magnesium: 2.4 mg/dL (ref 1.7–2.4)

## 2019-07-11 MED ORDER — SODIUM CHLORIDE 0.9 % IV SOLN
INTRAVENOUS | Status: DC | PRN
  Administered 2019-07-11: 17:00:00 75 mL via INTRAVENOUS

## 2019-07-11 MED ORDER — DIAZEPAM 5 MG PO TABS
5.0000 mg | ORAL_TABLET | Freq: Four times a day (QID) | ORAL | Status: DC | PRN
  Administered 2019-07-11 – 2019-07-17 (×15): 5 mg via ORAL
  Filled 2019-07-11 (×15): qty 1

## 2019-07-11 MED ORDER — GABAPENTIN 300 MG PO CAPS
300.0000 mg | ORAL_CAPSULE | Freq: Every day | ORAL | Status: DC | PRN
  Administered 2019-07-12: 06:00:00 300 mg via ORAL
  Filled 2019-07-11: qty 1

## 2019-07-11 MED ORDER — POTASSIUM CHLORIDE CRYS ER 20 MEQ PO TBCR
40.0000 meq | EXTENDED_RELEASE_TABLET | Freq: Once | ORAL | Status: AC
  Administered 2019-07-11: 10:00:00 40 meq via ORAL
  Filled 2019-07-11: qty 2

## 2019-07-11 MED ORDER — POTASSIUM CHLORIDE IN NACL 20-0.9 MEQ/L-% IV SOLN
INTRAVENOUS | Status: DC
  Filled 2019-07-11 (×9): qty 1000

## 2019-07-11 NOTE — Progress Notes (Signed)
No Show

## 2019-07-11 NOTE — Progress Notes (Signed)
Subjective: Denies any fever.  Has dry cough.  Says she is very anxious.  Objective: Vital signs in last 24 hours: Temp:  [98.4 F (36.9 C)-100.1 F (37.8 C)] 100.1 F (37.8 C) (02/02 1156) Pulse Rate:  [87-104] 98 (02/02 1156) Resp:  [12-70] 16 (02/02 1156) BP: (91-112)/(45-77) 111/64 (02/02 1156) SpO2:  [85 %-97 %] 96 % (02/02 1156) Weight:  [58.5 kg] 58.5 kg (02/01 2141)  Intake/Output from previous day: 02/01 0701 - 02/02 0700 In: 2003.8 [P.O.:240; I.V.:1034.9] Out: 700 [Urine:700] Intake/Output this shift: Total I/O In: -  Out: 100 [Urine:100]   General: Not in acute distress HEENT:       Eyes: PERRL, EOMI, no scleral icterus.       ENT: No discharge from the ears and nose, no pharynx injection, no tonsillar enlargement.        Neck: No JVD, no bruit, no mass felt. Heme: No neck lymph node enlargement. Cardiac: S1/S2, RRR, No murmurs, No gallops or rubs. Respiratory:  No rales, wheezing, rhonchi or rubs. GI: Soft, nondistended, nontender mostly no rebound pain, no organomegaly, BS present. GU: No hematuria Ext: No pitting leg edema bilaterally. 2+DP/PT pulse bilaterally. Musculoskeletal: No joint deformities, No joint redness or warmth, no limitation of ROM in spin. Skin: No rashes.  Neuro: Alert, oriented X3, cranial nerves II-XII grossly intact, moves all extremities normally Psych:   Denies any suicidal or hemocidal ideation.  Denies also hallucination.  Has anxiety  Labs on Admission: I have personally reviewed following labs and imaging studies  Results for orders placed or performed during the hospital encounter of 07/10/19 (from the past 24 hour(s))  Lactic acid, plasma     Status: None   Collection Time: 07/10/19 12:37 PM  Result Value Ref Range   Lactic Acid, Venous 1.8 0.5 - 1.9 mmol/L  Magnesium     Status: Abnormal   Collection Time: 07/10/19 12:52 PM  Result Value Ref Range   Magnesium 1.6 (L) 1.7 - 2.4 mg/dL  Blood culture (routine x 2)      Status: None (Preliminary result)   Collection Time: 07/10/19 12:52 PM   Specimen: BLOOD  Result Value Ref Range   Specimen Description BLOOD RIGHT ANTECUBITAL    Special Requests      BOTTLES DRAWN AEROBIC AND ANAEROBIC Blood Culture adequate volume   Culture      NO GROWTH < 24 HOURS Performed at [facility], [address] [place], [place] [address]    Report Status PENDING   Procalcitonin - Baseline     Status: None   Collection Time: 07/10/19 12:52 PM  Result Value Ref Range   Procalcitonin 1.82 ng/mL  Troponin I (High Sensitivity)     Status: None   Collection Time: 07/10/19 12:52 PM  Result Value Ref Range   Troponin I (High Sensitivity) 11 <18 ng/L  Blood culture (routine x 2)     Status: None (Preliminary result)   Collection Time: 07/10/19 12:53 PM   Specimen: BLOOD  Result Value Ref Range   Specimen Description BLOOD BLOOD LEFT FOREARM    Special Requests      BOTTLES DRAWN AEROBIC AND ANAEROBIC Blood Culture adequate volume   Culture      NO GROWTH < 24 HOURS Performed at [facility], [address], [place], [place] [address]    Report Status PENDING   Urinalysis, Routine w reflex microscopic     Status: Abnormal   Collection Time: 07/10/19  1:55 PM  Result Value Ref Range   Color, Urine AMBER (A) YELLOW   APPearance HAZY (A) CLEAR   Specific Gravity, Urine 1.015 1.005 - 1.030   pH 5.0 5.0 - 8.0   Glucose, UA NEGATIVE NEGATIVE mg/dL   Hgb urine dipstick NEGATIVE NEGATIVE   Bilirubin Urine NEGATIVE NEGATIVE   Ketones, ur NEGATIVE NEGATIVE mg/dL   Protein, ur NEGATIVE NEGATIVE mg/dL   Nitrite NEGATIVE NEGATIVE   Leukocytes,Ua SMALL (A) NEGATIVE   RBC / HPF 0-5 0 - 5 RBC/hpf   WBC, UA 0-5 0 - 5 WBC/hpf   Bacteria, UA RARE (A) NONE SEEN   Squamous Epithelial / LPF 0-5 0 - 5   Mucus PRESENT    Hyaline Casts, UA PRESENT   Lactic acid, plasma     Status: Abnormal   Collection Time: 07/10/19  1:56 PM  Result Value Ref Range   Lactic  Acid, Venous 2.1 (HH) 0.5 - 1.9 mmol/L  Pregnancy, urine POC     Status: None   Collection Time: 07/10/19  2:20 PM  Result Value Ref Range   Preg Test, Ur NEGATIVE NEGATIVE  Protime-INR     Status: Abnormal   Collection Time: 07/10/19  2:43 PM  Result Value Ref Range   Prothrombin Time 18.7 (H) 11.4 - 15.2 seconds   INR 1.6 (H) 0.8 - 1.2  APTT     Status: Abnormal   Collection Time: 07/10/19  2:43 PM  Result Value Ref Range   aPTT 45 (H) 24 - 36 seconds  Lipase, blood     Status: None   Collection Time: 07/10/19  5:36 PM  Result Value Ref Range   Lipase 21 11 - 51 U/L  Ammonia     Status: None   Collection Time: 07/10/19  8:45 PM  Result Value Ref Range   Ammonia 31 9 - 35 umol/L  Strep pneumoniae urinary antigen     Status: None   Collection Time: 07/10/19 11:46 PM  Result Value Ref Range   Strep Pneumo Urinary Antigen NEGATIVE NEGATIVE  Procalcitonin     Status: None   Collection Time: 07/11/19  7:22 AM  Result Value Ref Range   Procalcitonin 1.16 ng/mL  Magnesium     Status: None   Collection Time: 07/11/19  7:22 AM  Result Value Ref Range   Magnesium 2.4 1.7 - 2.4 mg/dL  Basic metabolic panel     Status: Abnormal   Collection Time: 07/11/19  7:22 AM  Result Value Ref Range   Sodium 134 (L) 135 - 145 mmol/L   Potassium 2.6 (LL) 3.5 - 5.1 mmol/L   Chloride 97 (L) 98 - 111 mmol/L   CO2 27 22 - 32 mmol/L   Glucose, Bld 104 (H) 70 - 99 mg/dL   BUN 16 6 - 20 mg/dL   Creatinine, Ser 0.42 (L) 0.44 - 1.00 mg/dL   Calcium 7.7 (L) 8.9 - 10.3 mg/dL   GFR calc non Af Amer >60 >60 mL/min   GFR calc Af Amer >60 >60 mL/min   Anion gap 10 5 - 15  CBC     Status: Abnormal   Collection Time: 07/11/19  7:22 AM  Result Value Ref Range   WBC 7.5 4.0 - 10.5 K/uL   RBC 3.78 (L) 3.87 - 5.11 MIL/uL   Hemoglobin 8.7 (L) 12.0 - 15.0 g/dL   HCT 28.2 (L) 36.0 - 46.0 %   MCV 74.6 (L) 80.0 - 100.0 fL   MCH 23.0 (L) 26.0 -  34.0 pg   MCHC 30.9 30.0 - 36.0 g/dL   RDW 25.4 (H) 11.5 - 15.5 %    Platelets 57 (L) 150 - 400 K/uL   nRBC 0.0 0.0 - 0.2 %    Studies/Results: CT Head Wo Contrast  Result Date: 07/10/2019 CLINICAL DATA:  Altered mental status EXAM: CT HEAD WITHOUT CONTRAST TECHNIQUE: Contiguous axial images were obtained from the base of the skull through the vertex without intravenous contrast. COMPARISON:  None. FINDINGS: Brain: There is no acute intracranial hemorrhage, mass-effect, or edema. Gray-white differentiation is preserved. There is no extra-axial fluid collection. Ventricles and sulci are within normal limits in size and configuration. Vascular: Unremarkable. Skull: Calvarium is unremarkable. Sinuses/Orbits: No acute finding. Other: None. IMPRESSION: No acute intracranial abnormality. Electronically Signed   By: Macy Mis M.D.   On: 07/10/2019 14:37   CT ABDOMEN PELVIS W CONTRAST  Result Date: 07/10/2019 CLINICAL DATA:  Lower abdominal pain EXAM: CT ABDOMEN AND PELVIS WITH CONTRAST TECHNIQUE: Multidetector CT imaging of the abdomen and pelvis was performed using the standard protocol following bolus administration of intravenous contrast. CONTRAST:  26m OMNIPAQUE IOHEXOL 300 MG/ML  SOLN COMPARISON:  None. FINDINGS: Lower chest: Patchy ground-glass opacity in the visualized lower lungs. Hepatobiliary: Too small to characterize hypoattenuating lesion in the inferior right hepatic lobe. Gallbladder is mildly distended with a small dependent calculus. No wall thickening. No pericholecystic fluid. Pancreas: Unremarkable. Spleen: Mild splenomegaly with AP measurement of 14 cm. Adrenals/Urinary Tract: Kidneys, adrenals, bladder are unremarkable. Stomach/Bowel: Stomach is within normal limits. Bowel is normal in caliber. Normal appendix. Vascular/Lymphatic: Aortic atherosclerosis. Patent paraumbilical vein. No enlarged abdominal or pelvic lymph nodes. Reproductive: Uterus is unremarkable. IUD is present. No adnexal mass. Other: There is no ascites.  Abdominal wall is  unremarkable. Musculoskeletal: Age-indeterminate compression deformity of L1. Prominent Schmorl's node at the superior endplate of TY77and L3. Evidence of prior pedicle screws and posterior decompression. IMPRESSION: Patchy ground-glass opacities in the lower lungs, which may reflect atypical pneumonia including COVID-19. Suspected portal hypertension. Age-indeterminate compression deformity of L1. Electronically Signed   By: PMacy MisM.D.   On: 07/10/2019 14:27   DG Chest Portable 1 View  Result Date: 07/10/2019 CLINICAL DATA:  Leukocytosis, COPD, question COVID-19 EXAM: PORTABLE CHEST 1 VIEW COMPARISON:  Portable exam 1258 hours without priors for comparison FINDINGS: Normal heart size, mediastinal contours, and pulmonary vascularity. Diffuse hazy BILATERAL interstitial infiltrates consistent with multifocal pneumonia. No pleural effusion or pneumothorax. Bones unremarkable. IMPRESSION: Diffuse BILATERAL interstitial infiltrates consistent with multifocal pneumonia, question COVID-19. Electronically Signed   By: MLavonia DanaM.D.   On: 07/10/2019 13:12    Scheduled Meds: . dextromethorphan-guaiFENesin  1 tablet Oral BID  . ferrous sulfate  325 mg Oral BID  . folic acid  1 mg Oral Daily  . LORazepam  0-4 mg Intravenous Q6H   Followed by  . [START ON 07/12/2019] LORazepam  0-4 mg Intravenous Q12H  . metoprolol succinate  25 mg Oral Daily  . multivitamin with minerals  1 tablet Oral Daily  . nicotine  21 mg Transdermal Daily  . pantoprazole  40 mg Oral Daily  . sucralfate  1 g Oral TID WC & HS  . thiamine  100 mg Oral Daily   Or  . thiamine  100 mg Intravenous Daily   Continuous Infusions: . 0.9 % NaCl with KCl 20 mEq / L 100 mL/hr at 07/11/19 1023  . azithromycin Stopped (07/10/19 1947)  . cefTRIAXone (ROCEPHIN)  IV 1  g (07/10/19 2256)   PRN Meds:albuterol, diazepam, gabapentin, hydrALAZINE, LORazepam **OR** LORazepam, morphine injection, oxyCODONE, triamcinolone cream,  zolpidem  Assessment/Plan:  CAP (community acquired pneumonia)/bilateral multifocal pneumonia and sepsis:  Having mild cough but denied any fever so far as admission  -Rapid COVID-19 tested negative in the emergency -But chest x-ray showed bilateral multifocal infiltration.  CT of abdomen/pelvis also showed patchy groundglass in lower lobe, still concerning COVID-19 infection.  -This point PUI and need -Ordered Covid19 PCR in AM of 2-21   -On admission patient met criteria for sepsis with leukocytosis, tachycardia.  Lactic acid elevated 2.1.   Now hemodynamically stable -Continue IV Rocephin and azithromycin since admission (pt received one dose of IV Vancomycin, cefepime and flagyl in ED) -Patient currently requiring 2 L of supplemental oxygen via nasal cannula; gradually wean off to room air as possible -Continuous pulse ox - Mucinex for cough  - Bronchodilators - Urine legionella ending and S. pneumococcal antigen negative - Follow up blood culture x2 so far no growth, sputum culture yet to be sent -Continue IVF: 100 cc/h of NS  Thrombocytopenia ([place]): Likely due to liver cirrhosis.   -Admission platelet 70, and now 57; no obvious bleeding -Follow-up by CBC  Tobacco abuse and Alcohol abuse: So far no severe withdrawal sign but has anxiety which is chronic as per patient -Nicotine patch -CIWA protocol  IDA (iron deficiency anemia): Hgb 10.3, stable -Continue iron supplement  Abdominal pain:  Resolved now  - etiology is not clear.    Admission CT abdomen/pelvis is not impressive, no ascites, low suspicions for SBP -Supportive care -Lipase normal  Hyponatremia: Proved to 134 -Admission Sodium 125.  Likely due to decreased oral intake and continuation of diuretics (Lasix and Hygroton) -Hold diuretics -On IV normal saline as above -Follow-up of BMP  Hypokalemia: Severe hypokalemia K=2.6  on admission. -Despite repletion follow-up potassium came back 2.6 -Added with IV  fluid in addition to oral supplementation -Mag level normal now -Repeat labs  Chest pain:  No more chest pain as of today  -Admission troponin negative - chronic, very atypical, possibly due to pneumonia    -Supportive care, as needed morphine for pain  COPD (chronic obstructive pulmonary disease) ([place]): -Bronchodilators  Liver cirrhosis ([place]): No ascites on CT scan -Normal ammonia ammonia level; INR 1 to be 1.6, PTT 18.7 -No obvious bleeding at this point -Continue to monitor  HTN:  -Continue home medications: Metoprolol -hydralazine prn  Anxiety: -Psychiatry was consulted, Dr. Shelby Dubin -Recommended prn Valium 5 mg q6h -Follow further psych recs; appreciate psych recommendation  VT prophylaxis   LOS: 1 day   Aidel Davisson Izetta Dakin

## 2019-07-11 NOTE — Progress Notes (Signed)
CRITICAL VALUE ALERT  Critical Value:  K 2.6  Date & Time Notied:  07/11/2019  8:40 AM  RN notified: Linard Millers, RN (Via secure chat)

## 2019-07-12 DIAGNOSIS — R600 Localized edema: Secondary | ICD-10-CM

## 2019-07-12 DIAGNOSIS — D696 Thrombocytopenia, unspecified: Secondary | ICD-10-CM

## 2019-07-12 DIAGNOSIS — R945 Abnormal results of liver function studies: Secondary | ICD-10-CM

## 2019-07-12 DIAGNOSIS — E876 Hypokalemia: Secondary | ICD-10-CM

## 2019-07-12 DIAGNOSIS — K7460 Unspecified cirrhosis of liver: Secondary | ICD-10-CM

## 2019-07-12 DIAGNOSIS — Z8541 Personal history of malignant neoplasm of cervix uteri: Secondary | ICD-10-CM

## 2019-07-12 DIAGNOSIS — E871 Hypo-osmolality and hyponatremia: Secondary | ICD-10-CM

## 2019-07-12 DIAGNOSIS — F419 Anxiety disorder, unspecified: Secondary | ICD-10-CM

## 2019-07-12 DIAGNOSIS — F329 Major depressive disorder, single episode, unspecified: Secondary | ICD-10-CM

## 2019-07-12 DIAGNOSIS — F1010 Alcohol abuse, uncomplicated: Secondary | ICD-10-CM

## 2019-07-12 DIAGNOSIS — J189 Pneumonia, unspecified organism: Secondary | ICD-10-CM

## 2019-07-12 DIAGNOSIS — F17210 Nicotine dependence, cigarettes, uncomplicated: Secondary | ICD-10-CM

## 2019-07-12 DIAGNOSIS — R161 Splenomegaly, not elsewhere classified: Secondary | ICD-10-CM

## 2019-07-12 DIAGNOSIS — G600 Hereditary motor and sensory neuropathy: Secondary | ICD-10-CM

## 2019-07-12 LAB — BASIC METABOLIC PANEL
Anion gap: 11 (ref 5–15)
BUN: 7 mg/dL (ref 6–20)
CO2: 26 mmol/L (ref 22–32)
Calcium: 7.7 mg/dL — ABNORMAL LOW (ref 8.9–10.3)
Chloride: 97 mmol/L — ABNORMAL LOW (ref 98–111)
Creatinine, Ser: 0.38 mg/dL — ABNORMAL LOW (ref 0.44–1.00)
GFR calc Af Amer: >60 mL/min (ref >60)
GFR calc non Af Amer: >60 mL/min (ref >60)
Glucose, Bld: 93 mg/dL (ref 70–99)
Potassium: 3.3 mmol/L — ABNORMAL LOW (ref 3.5–5.1)
Sodium: 134 mmol/L — ABNORMAL LOW (ref 135–145)

## 2019-07-12 LAB — LEGIONELLA PNEUMOPHILA SEROGP 1 UR AG: L. pneumophila Serogp 1 Ur Ag: NEGATIVE

## 2019-07-12 LAB — PROCALCITONIN: Procalcitonin: 0.59 ng/mL

## 2019-07-12 LAB — HIV ANTIBODY (ROUTINE TESTING W REFLEX): HIV Screen 4th Generation wRfx: NONREACTIVE — AB

## 2019-07-12 MED ORDER — POTASSIUM CHLORIDE CRYS ER 20 MEQ PO TBCR
40.0000 meq | EXTENDED_RELEASE_TABLET | Freq: Once | ORAL | Status: AC
  Administered 2019-07-12: 13:00:00 40 meq via ORAL
  Filled 2019-07-12: qty 2

## 2019-07-12 MED ORDER — AZITHROMYCIN 250 MG PO TABS
500.0000 mg | ORAL_TABLET | Freq: Every day | ORAL | Status: DC
  Administered 2019-07-12 – 2019-07-13 (×2): 500 mg via ORAL
  Filled 2019-07-12 (×3): qty 2

## 2019-07-12 MED ORDER — CALCIUM GLUCONATE-NACL 1-0.675 GM/50ML-% IV SOLN
1.0000 g | Freq: Once | INTRAVENOUS | Status: AC
  Administered 2019-07-12: 1000 mg via INTRAVENOUS
  Filled 2019-07-12: qty 50

## 2019-07-12 NOTE — Consult Note (Addendum)
PHARMACY CONSULT NOTE - FOLLOW UP  Pharmacy Consult for Electrolyte Monitoring and Replacement   Recent Labs: Potassium (mmol/L)  Date Value  07/12/2019 3.3 (L)   Magnesium (mg/dL)  Date Value  07/11/2019 2.4   Calcium (mg/dL)  Date Value  07/12/2019 7.7 (L)   Albumin (g/dL)  Date Value  07/10/2019 3.9  03/29/2019 2.9 (L)   Sodium (mmol/L)  Date Value  07/12/2019 134 (L)  06/15/2019 133 (L)     Corrected Ca 7.8   Assessment: Rhonda Martin is a 44 y.o. female with medical history significant of hypertension, COPD, GERD, liver cirrhosis, hepatitis, iron deficiency anemia, cervical cancer, tobacco abuse, alcohol abuse, thrombocytopenia, who presents with anxiety, abdominal pain, shortness of breath. Pt is currently on NS w/ KCl 20 mEq.   Goal of Therapy:  WNL  Plan:  Will order KCl 40 mEq x 1 PO and Ca gluconate 1 g x 1 IV  Oswald Hillock ,PharmD, BCPS Clinical Pharmacist 07/12/2019 11:31 AM
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
## 2019-07-13 LAB — CBC
HCT: 28.8 % — ABNORMAL LOW (ref 36.0–46.0)
Hemoglobin: 9 g/dL — ABNORMAL LOW (ref 12.0–15.0)
MCH: 23.6 pg — ABNORMAL LOW (ref 26.0–34.0)
MCHC: 31.3 g/dL (ref 30.0–36.0)
MCV: 75.4 fL — ABNORMAL LOW (ref 80.0–100.0)
Platelets: 76 10*3/uL — ABNORMAL LOW (ref 150–400)
RBC: 3.82 MIL/uL — ABNORMAL LOW (ref 3.87–5.11)
RDW: 26.6 % — ABNORMAL HIGH (ref 11.5–15.5)
WBC: 5.2 10*3/uL (ref 4.0–10.5)
nRBC: 0 % (ref 0.0–0.2)

## 2019-07-13 LAB — BASIC METABOLIC PANEL
Anion gap: 7 (ref 5–15)
BUN: 6 mg/dL (ref 6–20)
CO2: 26 mmol/L (ref 22–32)
Calcium: 8.4 mg/dL — ABNORMAL LOW (ref 8.9–10.3)
Chloride: 100 mmol/L (ref 98–111)
Creatinine, Ser: 0.35 mg/dL — ABNORMAL LOW (ref 0.44–1.00)
GFR calc Af Amer: >60 mL/min (ref >60)
GFR calc non Af Amer: >60 mL/min (ref >60)
Glucose, Bld: 95 mg/dL (ref 70–99)
Potassium: 3.7 mmol/L (ref 3.5–5.1)
Sodium: 133 mmol/L — ABNORMAL LOW (ref 135–145)

## 2019-07-13 LAB — MAGNESIUM: Magnesium: 1.3 mg/dL — ABNORMAL LOW (ref 1.7–2.4)

## 2019-07-13 MED ORDER — HYDROMORPHONE HCL 1 MG/ML IJ SOLN
1.0000 mg | Freq: Once | INTRAMUSCULAR | Status: AC
  Administered 2019-07-13: 05:00:00 1 mg via INTRAVENOUS
  Filled 2019-07-13: qty 1

## 2019-07-13 MED ORDER — CEFUROXIME AXETIL 500 MG PO TABS
500.0000 mg | ORAL_TABLET | Freq: Two times a day (BID) | ORAL | Status: AC
  Administered 2019-07-13 – 2019-07-14 (×3): 500 mg via ORAL
  Filled 2019-07-13 (×3): qty 1

## 2019-07-13 MED ORDER — MAGNESIUM SULFATE 4 GM/100ML IV SOLN
4.0000 g | Freq: Once | INTRAVENOUS | Status: AC
  Administered 2019-07-13: 14:00:00 4 g via INTRAVENOUS
  Filled 2019-07-13: qty 100

## 2019-07-13 NOTE — Consult Note (Signed)
  Patient seen today. She still reports feeling sick and in pain. But she states that her anxiety has improved due to the use of the Valium medication which she feels has been helpful to her. She denies any other complaints at this time.  Psychiatry will continue to follow  No medication changes at this time

## 2019-07-13 NOTE — Progress Notes (Signed)
Nurse heard bang and upon entering pt room pt noted to be sitting on the floor in the doorway to the bathroom.  Pt stated she was going to the bathroom and had started to urinate and she slipped in the urine.  She denies dizziness or LOC.  She denies hitting her head or any injury.  A small abrasion and bruise was noted on pt left knee. Pt assisted to stand by staff and cleaned up and settled into bed.  Bed alarm on.  Pt aware alarm is in place and she has been asked to call for assistance. Purewick placed.

## 2019-07-13 NOTE — Consult Note (Signed)
PHARMACY CONSULT NOTE - FOLLOW UP  Pharmacy Consult for Electrolyte Monitoring and Replacement   Recent Labs: Potassium (mmol/L)  Date Value  07/13/2019 3.7   Magnesium (mg/dL)  Date Value  07/13/2019 1.3 (L)   Calcium (mg/dL)  Date Value  07/13/2019 8.4 (L)   Albumin (g/dL)  Date Value  07/10/2019 3.9  03/29/2019 2.9 (L)   Sodium (mmol/L)  Date Value  07/13/2019 133 (L)  06/15/2019 133 (L)     Corrected Ca 7.8   Assessment: Rhonda Martin is a 44 y.o. female with medical history significant of hypertension, COPD, GERD, liver cirrhosis, hepatitis, iron deficiency anemia, cervical cancer, tobacco abuse, alcohol abuse, thrombocytopenia, who presents with anxiety, abdominal pain, shortness of breath. Pt is currently on NS w/ KCl 20 mEq.   Goal of Therapy:  WNL  Plan:  Will order Mg 4 g IV x 1.   Will order BMP and Mg with AM labs.   Oswald Hillock ,PharmD, BCPS Clinical Pharmacist 07/13/2019 7:24 AM

## 2019-07-13 NOTE — Progress Notes (Signed)
ID Pt had a PCR test for sars cov2 and it was negative so airborne isolation not needed Pt is being treated as CAP- last day of antibiotic ID will sign off- call if needed

## 2019-07-13 NOTE — Progress Notes (Signed)
PT Cancellation Note  Patient Details Name: Rhonda Martin MRN: [id number] DOB: 1975-06-30   Cancelled Treatment:    Reason Eval/Treat Not Completed: Fatigue/lethargy limiting ability to participate;Pain limiting ability to participate(Chart reviewed, RN consulted. Pt refusing PT evaluation at this time. Wil attempt again at later date/time.)  11:25 AM, 07/13/19 Etta Grandchild, PT, DPT Physical Therapist - [facility]  [phone] ([place])    [place] 07/13/2019, 11:25 AM
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
## 2019-07-14 ENCOUNTER — Ambulatory Visit: Payer: Medicaid Other

## 2019-07-14 LAB — BASIC METABOLIC PANEL
Anion gap: 8 (ref 5–15)
BUN: 7 mg/dL (ref 6–20)
CO2: 23 mmol/L (ref 22–32)
Calcium: 8.1 mg/dL — ABNORMAL LOW (ref 8.9–10.3)
Chloride: 105 mmol/L (ref 98–111)
Creatinine, Ser: 0.37 mg/dL — ABNORMAL LOW (ref 0.44–1.00)
GFR calc Af Amer: >60 mL/min (ref >60)
GFR calc non Af Amer: >60 mL/min (ref >60)
Glucose, Bld: 132 mg/dL — ABNORMAL HIGH (ref 70–99)
Potassium: 4.4 mmol/L (ref 3.5–5.1)
Sodium: 136 mmol/L (ref 135–145)

## 2019-07-14 LAB — MAGNESIUM: Magnesium: 1.7 mg/dL (ref 1.7–2.4)

## 2019-07-14 MED ORDER — MAGNESIUM SULFATE 2 GM/50ML IV SOLN
2.0000 g | Freq: Once | INTRAVENOUS | Status: AC
  Administered 2019-07-14: 10:00:00 2 g via INTRAVENOUS
  Filled 2019-07-14: qty 50

## 2019-07-14 MED ORDER — SODIUM CHLORIDE 0.9 % IV SOLN: INTRAVENOUS | Status: DC
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
## 2019-07-14 NOTE — Consult Note (Signed)
PHARMACY CONSULT NOTE - FOLLOW UP  Pharmacy Consult for Electrolyte Monitoring and Replacement   Recent Labs: Potassium (mmol/L)  Date Value  07/14/2019 4.4   Magnesium (mg/dL)  Date Value  07/14/2019 1.7   Calcium (mg/dL)  Date Value  07/14/2019 8.1 (L)   Albumin (g/dL)  Date Value  07/10/2019 3.9  03/29/2019 2.9 (L)   Sodium (mmol/L)  Date Value  07/14/2019 136  06/15/2019 133 (L)    Corrected Ca 8.2   Assessment: Rhonda Martin is a 44 y.o. female with medical history significant of hypertension, COPD, GERD, liver cirrhosis, hepatitis, iron deficiency anemia, cervical cancer, tobacco abuse, alcohol abuse, thrombocytopenia, who presents with anxiety, abdominal pain, shortness of breath. Pt is currently on NS w/ KCl 20 mEq.   Goal of Therapy:  WNL  Plan:  Will switch fluids to NS without KCl. Will give Mg 2 g IV x 1.   Will order BMP and Mg with AM labs.   Oswald Hillock ,PharmD, BCPS Clinical Pharmacist 07/14/2019 7:39 AM
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
## 2019-07-14 NOTE — Progress Notes (Addendum)
OT Cancellation Note  Patient Details Name: Rhonda Martin MRN: [id number] DOB: Mar 03, 1976   Cancelled Treatment:    Reason Eval/Treat Not Completed: Patient declined, no reason specified;Other (comment) Thank you for the OT consult, order received and chart reviewed. OT attempted to see pt for evaluation this date. Upon arrival to pt room, pt appears to be sleeping but wakens to VCs. Pt politely declines to participate in OT eval this date stating, "I already worked too hard with physical therapy and took a bunch of medicine, so I don't think it would be a good idea". Will hold OT eval and re-attempt next date as available and pt medically appropriate for OT services.   Shara Blazing, M.S., OTR/L Ascom: [phone] 07/14/19, 2:00 PM

## 2019-07-15 LAB — BASIC METABOLIC PANEL
Anion gap: 8 (ref 5–15)
BUN: 6 mg/dL (ref 6–20)
CO2: 22 mmol/L (ref 22–32)
Calcium: 7.6 mg/dL — ABNORMAL LOW (ref 8.9–10.3)
Chloride: 106 mmol/L (ref 98–111)
Creatinine, Ser: 0.33 mg/dL — ABNORMAL LOW (ref 0.44–1.00)
GFR calc Af Amer: >60 mL/min (ref >60)
GFR calc non Af Amer: >60 mL/min (ref >60)
Glucose, Bld: 98 mg/dL (ref 70–99)
Potassium: 3.2 mmol/L — ABNORMAL LOW (ref 3.5–5.1)
Sodium: 136 mmol/L (ref 135–145)

## 2019-07-15 LAB — CULTURE, BLOOD (ROUTINE X 2)
Culture: NO GROWTH
Culture: NO GROWTH
Special Requests: ADEQUATE
Special Requests: ADEQUATE

## 2019-07-15 LAB — MAGNESIUM: Magnesium: 1.5 mg/dL — ABNORMAL LOW (ref 1.7–2.4)

## 2019-07-15 MED ORDER — MAGNESIUM SULFATE 2 GM/50ML IV SOLN
2.0000 g | Freq: Once | INTRAVENOUS | Status: AC
  Administered 2019-07-15: 09:00:00 2 g via INTRAVENOUS
  Filled 2019-07-15: qty 50

## 2019-07-15 MED ORDER — POTASSIUM CHLORIDE CRYS ER 20 MEQ PO TBCR
40.0000 meq | EXTENDED_RELEASE_TABLET | Freq: Once | ORAL | Status: AC
  Administered 2019-07-15: 09:00:00 40 meq via ORAL
  Filled 2019-07-15: qty 2

## 2019-07-15 MED ORDER — CALCIUM CARBONATE ANTACID 500 MG PO CHEW
400.0000 mg | CHEWABLE_TABLET | Freq: Three times a day (TID) | ORAL | Status: AC
  Administered 2019-07-15 (×3): 400 mg via ORAL
  Filled 2019-07-15 (×3): qty 2

## 2019-07-15 NOTE — Consult Note (Addendum)
PHARMACY CONSULT NOTE - FOLLOW UP  Pharmacy Consult for Electrolyte Monitoring and Replacement   Recent Labs: Potassium (mmol/L)  Date Value  07/15/2019 3.2 (L)   Magnesium (mg/dL)  Date Value  07/15/2019 1.5 (L)   Calcium (mg/dL)  Date Value  07/15/2019 7.6 (L)   Albumin (g/dL)  Date Value  07/10/2019 3.9  03/29/2019 2.9 (L)   Sodium (mmol/L)  Date Value  07/15/2019 136  06/15/2019 133 (L)    Corrected Ca 8.0  Assessment: Rhonda Martin is a 44 y.o. female with medical history significant of hypertension, COPD, GERD, liver cirrhosis, hepatitis, iron deficiency anemia, cervical cancer, tobacco abuse, alcohol abuse, thrombocytopenia, who presents with anxiety, abdominal pain, shortness of breath. Pt is currently on NS @ 140ml/hr.  Hyponatremia resolved. Goal of Therapy:  WNL  Plan:  Will give KCL 45meq x 1.   Will give Mg 2 g IV x 1.  Will give Calcium Carbonate 400mg  (elemental Ca) po tid x 1 day  Will order BMP and Mg with AM labs.   Lu Duffel ,PharmD, BCPS Clinical Pharmacist 07/15/2019 7:13 AM
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
## 2019-07-15 NOTE — Progress Notes (Signed)
OT Cancellation Note  Patient Details Name: Rhonda Martin MRN: [id number] DOB: [birth year]   Cancelled Treatment:    Reason Eval/Treat Not Completed: Medical issues which prohibited therapy  Upon arrival - nsg tech come out of room-  Report pt is running fever of 101 and freezing looking for more blankets- Covid test was negative - OT checked on pt - pt report " I am freezing and light headed and cannot do therapy" - will hold off OT eval - Will complete eval when pt appropriate   Nycholas Rayner OTR/L,CLT 07/15/2019, 11:43 AM

## 2019-07-16 LAB — CBC WITH DIFFERENTIAL/PLATELET
Abs Immature Granulocytes: 0.02 10*3/uL (ref 0.00–0.07)
Basophils Absolute: 0.1 10*3/uL (ref 0.0–0.1)
Basophils Relative: 2 %
Eosinophils Absolute: 0.2 10*3/uL (ref 0.0–0.5)
Eosinophils Relative: 5 %
HCT: 33 % — ABNORMAL LOW (ref 36.0–46.0)
Hemoglobin: 10.2 g/dL — ABNORMAL LOW (ref 12.0–15.0)
Immature Granulocytes: 1 %
Lymphocytes Relative: 23 %
Lymphs Abs: 0.8 10*3/uL (ref 0.7–4.0)
MCH: 24.1 pg — ABNORMAL LOW (ref 26.0–34.0)
MCHC: 30.9 g/dL (ref 30.0–36.0)
MCV: 77.8 fL — ABNORMAL LOW (ref 80.0–100.0)
Monocytes Absolute: 0.8 10*3/uL (ref 0.1–1.0)
Monocytes Relative: 22 %
Neutro Abs: 1.7 10*3/uL (ref 1.7–7.7)
Neutrophils Relative %: 47 %
Platelets: 136 10*3/uL — ABNORMAL LOW (ref 150–400)
RBC: 4.24 MIL/uL (ref 3.87–5.11)
RDW: 27.3 % — ABNORMAL HIGH (ref 11.5–15.5)
WBC: 3.6 10*3/uL — ABNORMAL LOW (ref 4.0–10.5)
nRBC: 0 % (ref 0.0–0.2)

## 2019-07-16 LAB — BASIC METABOLIC PANEL
Anion gap: 7 (ref 5–15)
BUN: 5 mg/dL — ABNORMAL LOW (ref 6–20)
CO2: 27 mmol/L (ref 22–32)
Calcium: 8.4 mg/dL — ABNORMAL LOW (ref 8.9–10.3)
Chloride: 103 mmol/L (ref 98–111)
Creatinine, Ser: 0.42 mg/dL — ABNORMAL LOW (ref 0.44–1.00)
GFR calc Af Amer: >60 mL/min (ref >60)
GFR calc non Af Amer: >60 mL/min (ref >60)
Glucose, Bld: 95 mg/dL (ref 70–99)
Potassium: 4.2 mmol/L (ref 3.5–5.1)
Sodium: 137 mmol/L (ref 135–145)

## 2019-07-16 LAB — MAGNESIUM: Magnesium: 1.7 mg/dL (ref 1.7–2.4)

## 2019-07-16 MED ORDER — MAGNESIUM SULFATE 2 GM/50ML IV SOLN
2.0000 g | Freq: Once | INTRAVENOUS | Status: AC
  Administered 2019-07-16: 09:00:00 2 g via INTRAVENOUS
  Filled 2019-07-16: qty 50
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
## 2019-07-16 NOTE — Consult Note (Signed)
PHARMACY CONSULT NOTE - FOLLOW UP  Pharmacy Consult for Electrolyte Monitoring and Replacement   Recent Labs: Potassium (mmol/L)  Date Value  07/16/2019 4.2   Magnesium (mg/dL)  Date Value  07/16/2019 1.7   Calcium (mg/dL)  Date Value  07/16/2019 8.4 (L)   Albumin (g/dL)  Date Value  07/10/2019 3.9  03/29/2019 2.9 (L)   Sodium (mmol/L)  Date Value  07/16/2019 137  06/15/2019 133 (L)   Corrected Ca 8.48  Assessment: Rhonda Martin is a 44 y.o. female with medical history significant of hypertension, COPD, GERD, liver cirrhosis, hepatitis, iron deficiency anemia, cervical cancer, tobacco abuse, alcohol abuse, thrombocytopenia, who presents with anxiety, abdominal pain, shortness of breath. Pt is currently on NS @ 163ml/hr.  Hyponatremia resolved.  Goal of Therapy:  Electrolytes WNL  Plan: .   Will give Mg 2 g IV x 1.   Will order BMP and Mg with AM labs.   Lu Duffel ,PharmD, BCPS Clinical Pharmacist 07/16/2019 7:08 AM

## 2019-07-16 NOTE — Social Work (Signed)
TOC SW: Contacted homeless shelter to find out about availability for patient.  Left voicemail, waiting for call back.

## 2019-07-17 DIAGNOSIS — Z720 Tobacco use: Secondary | ICD-10-CM

## 2019-07-17 MED ORDER — NICOTINE 21 MG/24HR TD PT24: 21.0000 mg | MEDICATED_PATCH | Freq: Every day | TRANSDERMAL | 0 refills | Status: DC

## 2019-07-17 NOTE — Progress Notes (Signed)
Pt requested pain medication. Pain assessment completed and PRN pain med given. Per patient she will be discharging today, requesting for bed alarm to be turned off so she can get dressed. Offered assistance to get dressed x2 but patient declined stating she could do it herself.
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
## 2019-07-17 NOTE — Plan of Care (Signed)

## 2019-07-17 NOTE — Clinical Social Work Note (Addendum)
CSW has contacted Rhonda Martin with [facility]. At this time they have no avalibility. They are at capacity. Pt will be unable to get [facility] because of no established residency.  Pt is d/c today. CSW spoke with pt at bedside. Pt states she is not interested in a shelter. Pt states she has a place to go however when asked pt did not disclose location. Pt states she just needs to be able to get out of bed and get dressed. Pt is asking for RN--RN notified. CSW asked if pt would need transportation to location--pt states she does not need transport. Pt did accept list of [facility] resources provided by [facility] at bedside table.  [place], [place]
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
## 2019-07-17 NOTE — Progress Notes (Signed)
Patient given discharge instructions. Verbalized understanding without any questions or concerns. Husband picking up. IV out.

## 2019-07-17 NOTE — Consult Note (Signed)
[facility] for Electrolyte Monitoring and Replacement   Recent Labs: Potassium (mmol/L)  Date Value  07/16/2019 4.2   Magnesium (mg/dL)  Date Value  07/16/2019 1.7   Calcium (mg/dL)  Date Value  07/16/2019 8.4 (L)   Albumin (g/dL)  Date Value  07/10/2019 3.9  03/29/2019 2.9 (L)   Sodium (mmol/L)  Date Value  07/16/2019 137  06/15/2019 133 (L)   Corrected Ca 8.48  Assessment: Rhonda Martin is a 44 y.o. female with medical history significant of hypertension, COPD, GERD, liver cirrhosis, hepatitis, iron deficiency anemia, cervical cancer, tobacco abuse, alcohol abuse, thrombocytopenia, who presents with anxiety, abdominal pain, shortness of breath. Hyponatremia resolved.  Goal of Therapy:  Electrolytes WNL  Plan: .   No labs ordered. No replacement needed at this time.   Will order BMP and Mg with AM labs.   Oswald Hillock ,PharmD, BCPS Clinical Pharmacist 07/17/2019 7:31 AM

## 2019-07-18 ENCOUNTER — Encounter: Payer: Self-pay | Admitting: Family Medicine

## 2019-07-18 ENCOUNTER — Ambulatory Visit (INDEPENDENT_AMBULATORY_CARE_PROVIDER_SITE_OTHER): Payer: Medicaid Other | Admitting: Family Medicine

## 2019-07-18 ENCOUNTER — Other Ambulatory Visit: Payer: Self-pay | Admitting: Family Medicine

## 2019-07-18 VITALS — BP 109/72 | HR 116 | Temp 99.6°F | Ht 62.0 in | Wt 126.0 lb

## 2019-07-18 DIAGNOSIS — E876 Hypokalemia: Secondary | ICD-10-CM | POA: Diagnosis not present

## 2019-07-18 DIAGNOSIS — F419 Anxiety disorder, unspecified: Secondary | ICD-10-CM

## 2019-07-18 DIAGNOSIS — G4700 Insomnia, unspecified: Secondary | ICD-10-CM | POA: Diagnosis not present

## 2019-07-18 DIAGNOSIS — J189 Pneumonia, unspecified organism: Secondary | ICD-10-CM | POA: Diagnosis not present

## 2019-07-18 DIAGNOSIS — F411 Generalized anxiety disorder: Secondary | ICD-10-CM | POA: Diagnosis not present

## 2019-07-18 DIAGNOSIS — F5101 Primary insomnia: Secondary | ICD-10-CM

## 2019-07-18 MED ORDER — SPIRONOLACTONE 25 MG PO TABS: 25.0000 mg | ORAL_TABLET | Freq: Every day | ORAL | 0 refills | Status: DC
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
## 2019-07-18 NOTE — Telephone Encounter (Signed)
Requested Prescriptions  Pending Prescriptions Disp Refills  . gabapentin (NEURONTIN) 300 MG capsule [Pharmacy Med Name: GABAPENTIN 300 MG CAPSULE] 90 capsule 0    Sig: TAKE 1-3 CAPSULES BY MOUTH DAILY AS NEEDED     Neurology: Anticonvulsants - gabapentin Passed - 07/18/2019 12:02 PM      Passed - Valid encounter within last 12 months    Recent Outpatient Visits          Today Hypokalemia   [facility] Volney American, [place]   1 month ago Primary insomnia   [facility] Merrie Roof [place], [place]   3 months ago Bilateral leg edema   [place], [place]   3 months ago Bilateral leg edema   [place], [place]   5 months ago Bilateral leg edema   [place], Lilia Argue, [place]      Future Appointments            In 1 week Gilford Rile, Martie Lee, NP [place], [place]   In 1 month [place], Lilia Argue, [place], [place]           . KLOR-CON M20 20 MEQ tablet [Pharmacy Med Name: KLOR-CON M20 TABLET] 90 tablet 0    Sig: TAKE 1 TABLET (20 MEQ TOTAL) BY MOUTH 3 (THREE) TIMES DAILY.     Endocrinology:  Minerals - Potassium Supplementation Failed - 07/18/2019 12:02 PM      Failed - Cr in normal range and within 360 days    Creatinine, Ser  Date Value Ref Range Status  07/16/2019 0.42 (L) 0.44 - 1.00 mg/dL Final         Passed - K in normal range and within 360 days    Potassium  Date Value Ref Range Status  07/16/2019 4.2 3.5 - 5.1 mmol/L Final         Passed - Valid encounter within last 12 months    Recent Outpatient Visits          Today Hypokalemia   [facility] Volney American, [place]   1 month ago Primary insomnia   [place], [place], [place]   3 months ago Bilateral leg edema   [place], [place]   3 months ago Bilateral leg edema    [place], [place]   5 months ago Bilateral leg edema   [place], Lilia Argue, [place]      Future Appointments            In 1 week Gilford Rile, Martie Lee, NP [place], [place]   In 1 month [place], Lilia Argue, [place], [place]

## 2019-07-19 ENCOUNTER — Telehealth: Payer: Self-pay

## 2019-07-19 NOTE — Telephone Encounter (Signed)
  Patient was recently discharged from the hospital on 07/17/2019  No TCM completed, patient does not qualify for TCM services due to insurance coverage/medicaid.  Follow up already completed 07/18/2019.

## 2019-07-20 ENCOUNTER — Other Ambulatory Visit: Payer: Self-pay | Admitting: Family Medicine

## 2019-07-20 NOTE — Telephone Encounter (Signed)
Requested Prescriptions  Pending Prescriptions Disp Refills  . metoprolol succinate (TOPROL-XL) 25 MG 24 hr tablet [Pharmacy Med Name: METOPROLOL SUCC ER 25 MG TAB] 60 tablet 0    Sig: TAKE 1 TABLET BY MOUTH EVERY DAY     Cardiovascular:  Beta Blockers Failed - 07/20/2019  2:55 PM      Failed - Last Heart Rate in normal range    Pulse Readings from Last 1 Encounters:  07/18/19 (!) 116         Passed - Last BP in normal range    BP Readings from Last 1 Encounters:  07/18/19 109/72         Passed - Valid encounter within last 6 months    Recent Outpatient Visits          2 days ago Hypokalemia   [facility] Volney American, [place]   1 month ago Primary insomnia   [place], [place], [place]   3 months ago Bilateral leg edema   [place], [place]   3 months ago Bilateral leg edema   [place], [place]   5 months ago Bilateral leg edema   [place], Lilia Argue, [place]      Future Appointments            In 1 week Gilford Rile, Martie Lee, NP [place], [place]   In 1 month [place], Lilia Argue, [place], [place]

## 2019-07-21 ENCOUNTER — Ambulatory Visit: Payer: Medicaid Other

## 2019-07-22 NOTE — ED Provider Notes (Signed)
EKG: prolong qtc, normal sinus rate of 95, no st elevation, twi lead V2    Rhonda Beatrice, MD 07/22/19 [phone]

## 2019-07-27 NOTE — Assessment & Plan Note (Signed)
Exacerbated by health issues and family issues. Refer to Psychiatry, social work. Continue hydroxyzine regimen in meantime

## 2019-07-27 NOTE — Assessment & Plan Note (Signed)
D/c chlorthalidone, start spironolactone due to continued low potassium. Continue supplementation, recheck levels in 2 weeks

## 2019-07-27 NOTE — Assessment & Plan Note (Signed)
Continue hydroxyzine regimen. Refer to Psychiatry

## 2019-07-27 NOTE — Assessment & Plan Note (Signed)
Resolving on abx, continue close monitoring

## 2019-07-31 ENCOUNTER — Ambulatory Visit: Payer: Medicaid Other | Admitting: Family
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
## 2019-08-02 ENCOUNTER — Inpatient Hospital Stay: Payer: Medicaid Other | Attending: Oncology

## 2019-08-03 ENCOUNTER — Telehealth: Payer: Self-pay

## 2019-08-03 NOTE — Telephone Encounter (Signed)
Contacted patient regarding MD/venofer appts tomorrow, but she did not get labwork done so appts will need to be rescheduled. Pt does not wish to reschedule venofer because she had a "severe reaction" last time. Reports that she is taking oral iron BID. Agreeable to doing a virtual visit the day after labs.

## 2019-08-04 ENCOUNTER — Inpatient Hospital Stay: Payer: Medicaid Other

## 2019-08-04 ENCOUNTER — Inpatient Hospital Stay: Payer: Medicaid Other | Admitting: Oncology

## 2019-08-04 DIAGNOSIS — Z79899 Other long term (current) drug therapy: Secondary | ICD-10-CM | POA: Diagnosis not present

## 2019-08-07 ENCOUNTER — Other Ambulatory Visit: Payer: Self-pay

## 2019-08-07 ENCOUNTER — Ambulatory Visit: Payer: Medicaid Other | Admitting: Family Medicine

## 2019-08-08 ENCOUNTER — Inpatient Hospital Stay: Payer: Medicaid Other | Attending: Oncology

## 2019-08-08 ENCOUNTER — Other Ambulatory Visit: Payer: Self-pay

## 2019-08-08 DIAGNOSIS — F17210 Nicotine dependence, cigarettes, uncomplicated: Secondary | ICD-10-CM | POA: Insufficient documentation

## 2019-08-08 DIAGNOSIS — D500 Iron deficiency anemia secondary to blood loss (chronic): Secondary | ICD-10-CM | POA: Insufficient documentation

## 2019-08-08 DIAGNOSIS — D696 Thrombocytopenia, unspecified: Secondary | ICD-10-CM | POA: Insufficient documentation

## 2019-08-08 DIAGNOSIS — K7469 Other cirrhosis of liver: Secondary | ICD-10-CM | POA: Insufficient documentation

## 2019-08-08 DIAGNOSIS — J449 Chronic obstructive pulmonary disease, unspecified: Secondary | ICD-10-CM | POA: Diagnosis not present

## 2019-08-08 DIAGNOSIS — Z79899 Other long term (current) drug therapy: Secondary | ICD-10-CM | POA: Insufficient documentation

## 2019-08-08 LAB — IRON AND TIBC
Iron: 47 ug/dL (ref 28–170)
Saturation Ratios: 12 % (ref 10.4–31.8)
TIBC: 409 ug/dL (ref 250–450)
UIBC: 362 ug/dL

## 2019-08-08 LAB — CBC WITH DIFFERENTIAL/PLATELET
Abs Immature Granulocytes: 0.02 10*3/uL (ref 0.00–0.07)
Basophils Absolute: 0.1 10*3/uL (ref 0.0–0.1)
Basophils Relative: 1 %
Eosinophils Absolute: 0.3 10*3/uL (ref 0.0–0.5)
Eosinophils Relative: 3 %
HCT: 35.6 % — ABNORMAL LOW (ref 36.0–46.0)
Hemoglobin: 11.4 g/dL — ABNORMAL LOW (ref 12.0–15.0)
Immature Granulocytes: 0 %
Lymphocytes Relative: 14 %
Lymphs Abs: 1.2 10*3/uL (ref 0.7–4.0)
MCH: 25.4 pg — ABNORMAL LOW (ref 26.0–34.0)
MCHC: 32 g/dL (ref 30.0–36.0)
MCV: 79.3 fL — ABNORMAL LOW (ref 80.0–100.0)
Monocytes Absolute: 0.7 10*3/uL (ref 0.1–1.0)
Monocytes Relative: 8 %
Neutro Abs: 6.2 10*3/uL (ref 1.7–7.7)
Neutrophils Relative %: 74 %
Platelets: 81 10*3/uL — ABNORMAL LOW (ref 150–400)
RBC: 4.49 MIL/uL (ref 3.87–5.11)
RDW: 27.9 % — ABNORMAL HIGH (ref 11.5–15.5)
WBC: 8.4 10*3/uL (ref 4.0–10.5)
nRBC: 0 % (ref 0.0–0.2)

## 2019-08-08 LAB — SAMPLE TO BLOOD BANK

## 2019-08-08 LAB — FERRITIN: Ferritin: 166 ng/mL (ref 11–307)

## 2019-08-09 ENCOUNTER — Inpatient Hospital Stay (HOSPITAL_BASED_OUTPATIENT_CLINIC_OR_DEPARTMENT_OTHER): Payer: Medicaid Other | Admitting: Oncology

## 2019-08-09 ENCOUNTER — Encounter: Payer: Self-pay | Admitting: Oncology

## 2019-08-09 DIAGNOSIS — D500 Iron deficiency anemia secondary to blood loss (chronic): Secondary | ICD-10-CM | POA: Diagnosis not present

## 2019-08-09 DIAGNOSIS — K7469 Other cirrhosis of liver: Secondary | ICD-10-CM | POA: Diagnosis not present

## 2019-08-09 DIAGNOSIS — D696 Thrombocytopenia, unspecified: Secondary | ICD-10-CM | POA: Diagnosis not present

## 2019-08-09 MED ORDER — FERROUS SULFATE 325 (65 FE) MG PO TBEC: 325.0000 mg | DELAYED_RELEASE_TABLET | Freq: Two times a day (BID) | ORAL | 5 refills | Status: DC

## 2019-08-09 NOTE — Progress Notes (Signed)
Patient verified using two identifiers for virtual visit via telephone today.  Patient offers no hematology concerns today.

## 2019-08-10 ENCOUNTER — Other Ambulatory Visit: Payer: Self-pay

## 2019-08-10 ENCOUNTER — Ambulatory Visit (INDEPENDENT_AMBULATORY_CARE_PROVIDER_SITE_OTHER): Payer: Medicaid Other | Admitting: Family Medicine

## 2019-08-10 ENCOUNTER — Encounter: Payer: Self-pay | Admitting: Family Medicine

## 2019-08-10 VITALS — BP 127/78 | HR 99 | Temp 99.0°F | Ht 62.0 in | Wt 128.0 lb

## 2019-08-10 DIAGNOSIS — E876 Hypokalemia: Secondary | ICD-10-CM | POA: Diagnosis not present

## 2019-08-10 DIAGNOSIS — I10 Essential (primary) hypertension: Secondary | ICD-10-CM

## 2019-08-10 DIAGNOSIS — R600 Localized edema: Secondary | ICD-10-CM | POA: Diagnosis not present

## 2019-08-10 MED ORDER — SPIRONOLACTONE 25 MG PO TABS: 25.0000 mg | ORAL_TABLET | Freq: Every day | ORAL | 1 refills | Status: DC

## 2019-08-10 MED ORDER — PANTOPRAZOLE SODIUM 40 MG PO TBEC: 40.0000 mg | DELAYED_RELEASE_TABLET | Freq: Every day | ORAL | 1 refills | Status: DC

## 2019-08-10 NOTE — Progress Notes (Signed)
BP 127/78   Pulse 99   Temp 99 F (37.2 C) (Oral)   Ht 5\' 2"  (1.575 m)   Wt 128 lb (58.1 kg)   SpO2 92%   BMI 23.41 kg/m    Subjective:    [name], female    DOB: 1975/10/13, 44 y.o.   MRN: [id number]  HPI: EMMILEE Martin is a 44 y.o. female  Chief Complaint  Patient presents with  . Hypertension  . Edema    right leg   Patient presenting today for HTN and b/l leg edema f/u since starting on spironolactone and d/c of chlorthalidone due to persistent hypokalemia despite high dose supplementation. Edema has significantly improved, and home BPs running around 120/70 range. Denies side effects, CP, SOB. Still taking potassium at 20 meq TID. Due for labs for recheck.   Relevant past medical, surgical, family and social history reviewed and updated as indicated. Interim medical history since our last visit reviewed. Allergies and medications reviewed and updated.  Review of Systems  Per HPI unless specifically indicated above     Objective:    BP 127/78   Pulse 99   Temp 99 F (37.2 C) (Oral)   Ht 5\' 2"  (1.575 m)   Wt 128 lb (58.1 kg)   SpO2 92%   BMI 23.41 kg/m   Wt Readings from Last 3 Encounters:  08/10/19 128 lb (58.1 kg)  07/18/19 126 lb (57.2 kg)  07/17/19 126 lb 8 oz (57.4 kg)    Physical Exam Vitals and nursing note reviewed.  Constitutional:      Appearance: She is not ill-appearing.     Comments: Appears chronically ill  HENT:     Head: Atraumatic.  Eyes:     Extraocular Movements: Extraocular movements intact.     Conjunctiva/sclera: Conjunctivae normal.  Cardiovascular:     Rate and Rhythm: Normal rate and regular rhythm.     Heart sounds: Normal heart sounds.  Pulmonary:     Effort: Pulmonary effort is normal.     Breath sounds: Normal breath sounds.  Musculoskeletal:        General: No swelling. Normal range of motion.     Cervical back: Normal range of motion and neck supple.  Skin:    General: Skin is warm and dry.   Findings: No erythema.  Neurological:     Mental Status: She is alert and oriented to person, place, and time.  Psychiatric:        Mood and Affect: Mood normal.        Thought Content: Thought content normal.        Judgment: Judgment normal.     Results for orders placed or performed in visit on 99991111  Basic metabolic panel  Result Value Ref Range   Glucose 91 65 - 99 mg/dL   BUN 9 6 - 24 mg/dL   Creatinine, Ser 0.55 (L) 0.57 - 1.00 mg/dL   GFR calc non Af Amer 115 >59 mL/min/1.73   GFR calc Af Amer 133 >59 mL/min/1.73   BUN/Creatinine Ratio 16 9 - 23   Sodium 139 134 - 144 mmol/L   Potassium 5.0 3.5 - 5.2 mmol/L   Chloride 103 96 - 106 mmol/L   CO2 20 20 - 29 mmol/L   Calcium 8.3 (L) 8.7 - 10.2 mg/dL      Assessment & Plan:   Problem List Items Addressed This Visit      Cardiovascular and Mediastinum  HTN (hypertension)    BPs stable and WNL, continue current regimen      Relevant Medications   spironolactone (ALDACTONE) 25 MG tablet     Other   Bilateral leg edema   Relevant Orders   Basic metabolic panel (Completed)   Hypokalemia - Primary    Recheck bmp, continue supplementation. Will adjust based on results      Relevant Orders   Basic metabolic panel (Completed)       Follow up plan: Return in about 3 months (around 11/10/2019) for HTN.

## 2019-08-11 LAB — BASIC METABOLIC PANEL
BUN/Creatinine Ratio: 16 (ref 9–23)
BUN: 9 mg/dL (ref 6–24)
CO2: 20 mmol/L (ref 20–29)
Calcium: 8.3 mg/dL — ABNORMAL LOW (ref 8.7–10.2)
Chloride: 103 mmol/L (ref 96–106)
Creatinine, Ser: 0.55 mg/dL — ABNORMAL LOW (ref 0.57–1.00)
GFR calc Af Amer: 133 mL/min/{1.73_m2} (ref >59)
GFR calc non Af Amer: 115 mL/min/{1.73_m2} (ref >59)
Glucose: 91 mg/dL (ref 65–99)
Potassium: 5 mmol/L (ref 3.5–5.2)
Sodium: 139 mmol/L (ref 134–144)

## 2019-08-11 MED ORDER — POTASSIUM CHLORIDE CRYS ER 20 MEQ PO TBCR: EXTENDED_RELEASE_TABLET | ORAL | 2 refills | Status: DC

## 2019-08-11 NOTE — Progress Notes (Signed)
HEMATOLOGY-ONCOLOGY TeleHEALTH VISIT PROGRESS NOTE  I connected with Rhonda Martin on 08/11/19 at  2:45 PM EST by video enabled telemedicine visit and verified that I am speaking with the correct person using two identifiers. I discussed the limitations, risks, security and privacy concerns of performing an evaluation and management service by telemedicine and the availability of in-person appointments. I also discussed with the patient that there may be a patient responsible charge related to this service. The patient expressed understanding and agreed to proceed.   Other persons participating in the visit and their role in the encounter:  None  Patient's location: Home  Provider's location: office Chief Complaint: anemia, thrombocytopenia   INTERVAL HISTORY Rhonda Martin is a 44 y.o. female who has above history reviewed by me today presents for follow up visit for management of anemia, thrombocytopenia Problems and complaints are listed below:  Patient reports feeling tired and fatigued. Patient was recently admitted from 07/10/2019-07/17/2019 due to sepsis secondary to community-acquired pneumonia.  She has finished her antibiotics course.    Review of Systems  Constitutional: Positive for fatigue. Negative for appetite change, chills and fever.  HENT:   Negative for hearing loss and voice change.   Eyes: Negative for eye problems.  Respiratory: Negative for chest tightness and cough.   Cardiovascular: Negative for chest pain.  Gastrointestinal: Negative for abdominal distention, abdominal pain and blood in stool.  Endocrine: Negative for hot flashes.  Genitourinary: Negative for difficulty urinating and frequency.   Musculoskeletal: Negative for arthralgias.  Skin: Negative for itching and rash.  Neurological: Negative for extremity weakness.  Hematological: Negative for adenopathy.  Psychiatric/Behavioral: Negative for confusion.    Past Medical History:  Diagnosis Date  . Back  injury   . Cervical cancer ([place])   . Charcot-Marie-Tooth disease   . COPD (chronic obstructive pulmonary disease) ([place])   . Family history of adverse reaction to anesthesia    PONV  . GERD (gastroesophageal reflux disease)   . Hepatitis   . Hypertension   . IDA (iron deficiency anemia) 06/26/2019  . Leg injury   . Liver cirrhosis ([place])   . Symptomatic anemia 06/26/2019   Past Surgical History:  Procedure Laterality Date  . BACK SURGERY    . LEG SURGERY      Family History  Problem Relation Age of Onset  . Diabetes Mother   . Hypertension Mother   . Cancer Father        unknown what kind of cancer   . Hypertension Sister   . Hypertension Brother   . Heart attack Brother 19    Social History   Socioeconomic History  . Marital status: Significant Other    Spouse name: Not on file  . Number of children: Not on file  . Years of education: Not on file  . Highest education level: Not on file  Occupational History  . Not on file  Tobacco Use  . Smoking status: Current Every Day Smoker    Packs/day: 0.50    Years: 30.00    Pack years: 15.00    Types: Cigarettes  . Smokeless tobacco: Never Used  Substance and Sexual Activity  . Alcohol use: Yes    Alcohol/week: 10.0 standard drinks    Types: 10 Cans of beer per week  . Drug use: Not Currently  . Sexual activity: Not Currently    Birth control/protection: I.U.D.  Other Topics Concern  . Not on file  Social History Narrative  . Not on  file   Social Determinants of Health   Financial Resource Strain: Low Risk   . Difficulty of Paying Living Expenses: Not hard at all  Food Insecurity: No Food Insecurity  . Worried About Charity fundraiser in the Last Year: Never true  . Ran Out of Food in the Last Year: Never true  Transportation Needs: Unknown  . Lack of Transportation (Medical): No  . Lack of Transportation (Non-Medical): Not on file  Physical Activity: Sufficiently Active  . Days of Exercise per Week: 7 days   . Minutes of Exercise per Session: 30 min  Stress: Stress Concern Present  . Feeling of Stress : Very much  Social Connections: Slightly Isolated  . Frequency of Communication with Friends and Family: More than three times a week  . Frequency of Social Gatherings with Friends and Family: Three times a week  . Attends Religious Services: More than 4 times per year  . Active Member of Clubs or Organizations: No  . Attends Archivist Meetings: Never  . Marital Status: Living with partner  Intimate Partner Violence: Not At Risk  . Fear of Current or Ex-Partner: No  . Emotionally Abused: No  . Physically Abused: No  . Sexually Abused: No    Current Outpatient Medications on File Prior to Visit  Medication Sig Dispense Refill  . Blood Pressure Monitor MISC For automatic blood pressure cuff. 1 each 0  . hydrOXYzine (ATARAX/VISTARIL) 25 MG tablet Take 1 tablet (25 mg total) by mouth 3 (three) times daily as needed. (Patient taking differently: Take 25 mg by mouth 3 (three) times daily as needed for anxiety or itching. ) 90 tablet 1  . metoprolol succinate (TOPROL-XL) 25 MG 24 hr tablet TAKE 1 TABLET BY MOUTH EVERY DAY 60 tablet 0  . Multiple Vitamin (MULTIVITAMIN WITH MINERALS) TABS tablet Take 1 tablet by mouth daily. 30 tablet 0  . oxyCODONE (OXY IR/ROXICODONE) 5 MG immediate release tablet Take 5 mg by mouth 4 (four) times daily as needed for moderate pain or severe pain.    Marland Kitchen sucralfate (CARAFATE) 1 g tablet Take 1 tablet (1 g total) by mouth 4 (four) times daily -  with meals and at bedtime. 120 tablet 1  . triamcinolone cream (KENALOG) 0.1 % Apply 1 application topically 2 (two) times daily. (Patient taking differently: Apply 1 application topically 2 (two) times daily as needed (skin irritation). ) 30 g 0  . zolpidem (AMBIEN) 5 MG tablet Take 1 tablet (5 mg total) by mouth at bedtime as needed for sleep. 30 tablet 2  . gabapentin (NEURONTIN) 300 MG capsule TAKE 1-3 CAPSULES BY  MOUTH DAILY AS NEEDED 90 capsule 0   No current facility-administered medications on file prior to visit.    No Known Allergies     Observations/Objective: Today's Vitals   08/09/19 1338  PainSc: 7    There is no height or weight on file to calculate BMI.  Physical Exam  Constitutional: She is oriented to person, place, and time. No distress.  Neurological: She is alert and oriented to person, place, and time.  Psychiatric: Mood normal.    CBC    Component Value Date/Time   WBC 8.4 08/08/2019 1353   RBC 4.49 08/08/2019 1353   HGB 11.4 (L) 08/08/2019 1353   HGB 8.4 (L) 03/29/2019 1616   HCT 35.6 (L) 08/08/2019 1353   HCT 25.8 (L) 03/29/2019 1616   PLT 81 (L) 08/08/2019 1353   PLT 102 (L) 03/29/2019 1616  MCV 79.3 (L) 08/08/2019 1353   MCV 82 03/29/2019 1616   MCH 25.4 (L) 08/08/2019 1353   MCHC 32.0 08/08/2019 1353   RDW 27.9 (H) 08/08/2019 1353   RDW 20.7 (H) 03/29/2019 1616   LYMPHSABS 1.2 08/08/2019 1353   LYMPHSABS 0.9 03/29/2019 1616   MONOABS 0.7 08/08/2019 1353   EOSABS 0.3 08/08/2019 1353   EOSABS 0.1 03/29/2019 1616   BASOSABS 0.1 08/08/2019 1353   BASOSABS 0.1 03/29/2019 1616    CMP     Component Value Date/Time   NA 139 08/10/2019 1612   K 5.0 08/10/2019 1612   CL 103 08/10/2019 1612   CO2 20 08/10/2019 1612   GLUCOSE 91 08/10/2019 1612   GLUCOSE 95 07/16/2019 0342   BUN 9 08/10/2019 1612   CREATININE 0.55 (L) 08/10/2019 1612   CALCIUM 8.3 (L) 08/10/2019 1612   PROT 8.2 (H) 07/10/2019 1122   PROT 7.1 03/29/2019 1616   ALBUMIN 3.9 07/10/2019 1122   ALBUMIN 2.9 (L) 03/29/2019 1616   AST 112 (H) 07/10/2019 1122   ALT 38 07/10/2019 1122   ALKPHOS 82 07/10/2019 1122   BILITOT 5.4 (H) 07/10/2019 1122   BILITOT 2.8 (H) 03/29/2019 1616   GFRNONAA 115 08/10/2019 1612   GFRAA 133 08/10/2019 1612     Assessment and Plan: 1. Iron deficiency anemia due to chronic blood loss   2. Thrombocytopenia ([place])   3. Other cirrhosis of liver ([place])      #Iron deficiency anemia, Labs reviewed and discussed with patient. Iron panel has improved.  Ferritin 166, saturation 12, hemoglobin 11.4, increased.  MCV 79.3. No need for IV iron treatments. Given that she may have chronic blood loss due to thrombocytopenia, cirrhosis, I recommend patient to take oral iron supplementation ferrous sulfate 325 mg twice daily.  Chronic thrombocytopenia, platelet count 81, Liver cirrhosis, avoid alcohol.  Patient has establish care with Dr. Vicente Males.  Endoscopy was aborted previously due to severe hyponatremia. Patient reports that she has an appointment with primary care provider for repeating sodium level. I encourage patient to call GI office if sodium level is normal to set up endoscopy work-up.  Follow Up Instructions: 6 months  I discussed the assessment and treatment plan with the patient. The patient was provided an opportunity to ask questions and all were answered. The patient agreed with the plan and demonstrated an understanding of the instructions.  The patient was advised to call back or seek an in-person evaluation if the symptoms worsen or if the condition fails to improve as anticipated.     Earlie Server, MD 08/11/2019 11:10 PM

## 2019-08-15 ENCOUNTER — Other Ambulatory Visit: Payer: Self-pay

## 2019-08-15 DIAGNOSIS — R945 Abnormal results of liver function studies: Secondary | ICD-10-CM

## 2019-08-15 DIAGNOSIS — R933 Abnormal findings on diagnostic imaging of other parts of digestive tract: Secondary | ICD-10-CM

## 2019-08-15 DIAGNOSIS — R7989 Other specified abnormal findings of blood chemistry: Secondary | ICD-10-CM

## 2019-08-15 DIAGNOSIS — B182 Chronic viral hepatitis C: Secondary | ICD-10-CM

## 2019-08-15 NOTE — Telephone Encounter (Signed)
Spoke with pt and was able to schedule upper endoscopy procedure.

## 2019-08-15 NOTE — Telephone Encounter (Signed)
yes

## 2019-08-18 ENCOUNTER — Other Ambulatory Visit
Admission: RE | Admit: 2019-08-18 | Discharge: 2019-08-18 | Disposition: A | Discharge status: Home / Self Care | Payer: Medicaid Other | Source: Ambulatory Visit | Attending: Gastroenterology | Admitting: Gastroenterology

## 2019-08-18 DIAGNOSIS — Z20822 Contact with and (suspected) exposure to covid-19: Secondary | ICD-10-CM | POA: Diagnosis not present

## 2019-08-18 DIAGNOSIS — Z01812 Encounter for preprocedural laboratory examination: Secondary | ICD-10-CM | POA: Diagnosis not present

## 2019-08-18 LAB — SARS CORONAVIRUS 2 (TAT 6-24 HRS): SARS Coronavirus 2: NEGATIVE

## 2019-08-21 DIAGNOSIS — M1711 Unilateral primary osteoarthritis, right knee: Secondary | ICD-10-CM | POA: Diagnosis not present

## 2019-08-21 DIAGNOSIS — M21061 Valgus deformity, not elsewhere classified, right knee: Secondary | ICD-10-CM | POA: Diagnosis not present

## 2019-08-21 NOTE — Assessment & Plan Note (Signed)
Recheck bmp, continue supplementation. Will adjust based on results

## 2019-08-21 NOTE — Assessment & Plan Note (Signed)
BPs stable and WNL, continue current regimen 

## 2019-08-22 ENCOUNTER — Encounter
Admission: EM | Disposition: A | Discharge status: Home Health | Payer: Self-pay | Source: Home / Self Care | Attending: Internal Medicine

## 2019-08-22 ENCOUNTER — Observation Stay: Payer: Medicaid Other

## 2019-08-22 ENCOUNTER — Ambulatory Visit: Admission: RE | Admit: 2019-08-22 | Payer: Medicaid Other | Source: Home / Self Care | Admitting: Gastroenterology

## 2019-08-22 ENCOUNTER — Emergency Department: Payer: Medicaid Other

## 2019-08-22 ENCOUNTER — Observation Stay: Payer: Medicaid Other | Admitting: Anesthesiology

## 2019-08-22 ENCOUNTER — Other Ambulatory Visit: Payer: Self-pay

## 2019-08-22 ENCOUNTER — Inpatient Hospital Stay
Admission: EM | Admit: 2019-08-22 | Discharge: 2019-09-01 | DRG: 871 | Disposition: A | Discharge status: Home Health | Payer: Medicaid Other | Attending: Internal Medicine | Admitting: Internal Medicine

## 2019-08-22 DIAGNOSIS — G92 Toxic encephalopathy: Secondary | ICD-10-CM | POA: Diagnosis not present

## 2019-08-22 DIAGNOSIS — A419 Sepsis, unspecified organism: Secondary | ICD-10-CM

## 2019-08-22 DIAGNOSIS — F1010 Alcohol abuse, uncomplicated: Secondary | ICD-10-CM | POA: Diagnosis not present

## 2019-08-22 DIAGNOSIS — J9601 Acute respiratory failure with hypoxia: Secondary | ICD-10-CM | POA: Diagnosis present

## 2019-08-22 DIAGNOSIS — J449 Chronic obstructive pulmonary disease, unspecified: Secondary | ICD-10-CM

## 2019-08-22 DIAGNOSIS — M1711 Unilateral primary osteoarthritis, right knee: Secondary | ICD-10-CM | POA: Diagnosis present

## 2019-08-22 DIAGNOSIS — Z981 Arthrodesis status: Secondary | ICD-10-CM

## 2019-08-22 DIAGNOSIS — R918 Other nonspecific abnormal finding of lung field: Secondary | ICD-10-CM | POA: Diagnosis not present

## 2019-08-22 DIAGNOSIS — F419 Anxiety disorder, unspecified: Secondary | ICD-10-CM | POA: Diagnosis present

## 2019-08-22 DIAGNOSIS — K7200 Acute and subacute hepatic failure without coma: Secondary | ICD-10-CM | POA: Diagnosis present

## 2019-08-22 DIAGNOSIS — F10231 Alcohol dependence with withdrawal delirium: Secondary | ICD-10-CM | POA: Diagnosis present

## 2019-08-22 DIAGNOSIS — R441 Visual hallucinations: Secondary | ICD-10-CM | POA: Diagnosis not present

## 2019-08-22 DIAGNOSIS — R443 Hallucinations, unspecified: Secondary | ICD-10-CM | POA: Diagnosis present

## 2019-08-22 DIAGNOSIS — I10 Essential (primary) hypertension: Secondary | ICD-10-CM | POA: Diagnosis not present

## 2019-08-22 DIAGNOSIS — D709 Neutropenia, unspecified: Secondary | ICD-10-CM | POA: Diagnosis present

## 2019-08-22 DIAGNOSIS — R0689 Other abnormalities of breathing: Secondary | ICD-10-CM | POA: Diagnosis not present

## 2019-08-22 DIAGNOSIS — Z8541 Personal history of malignant neoplasm of cervix uteri: Secondary | ICD-10-CM

## 2019-08-22 DIAGNOSIS — Z809 Family history of malignant neoplasm, unspecified: Secondary | ICD-10-CM

## 2019-08-22 DIAGNOSIS — Z79899 Other long term (current) drug therapy: Secondary | ICD-10-CM

## 2019-08-22 DIAGNOSIS — J181 Lobar pneumonia, unspecified organism: Secondary | ICD-10-CM | POA: Diagnosis present

## 2019-08-22 DIAGNOSIS — J441 Chronic obstructive pulmonary disease with (acute) exacerbation: Secondary | ICD-10-CM | POA: Diagnosis present

## 2019-08-22 DIAGNOSIS — K219 Gastro-esophageal reflux disease without esophagitis: Secondary | ICD-10-CM | POA: Diagnosis present

## 2019-08-22 DIAGNOSIS — R442 Other hallucinations: Secondary | ICD-10-CM | POA: Diagnosis not present

## 2019-08-22 DIAGNOSIS — F29 Unspecified psychosis not due to a substance or known physiological condition: Secondary | ICD-10-CM | POA: Diagnosis not present

## 2019-08-22 DIAGNOSIS — M25561 Pain in right knee: Secondary | ICD-10-CM

## 2019-08-22 DIAGNOSIS — R5081 Fever presenting with conditions classified elsewhere: Secondary | ICD-10-CM | POA: Diagnosis present

## 2019-08-22 DIAGNOSIS — B377 Candidal sepsis: Principal | ICD-10-CM | POA: Diagnosis present

## 2019-08-22 DIAGNOSIS — G9341 Metabolic encephalopathy: Secondary | ICD-10-CM | POA: Diagnosis present

## 2019-08-22 DIAGNOSIS — Z833 Family history of diabetes mellitus: Secondary | ICD-10-CM

## 2019-08-22 DIAGNOSIS — K7682 Hepatic encephalopathy: Secondary | ICD-10-CM | POA: Diagnosis present

## 2019-08-22 DIAGNOSIS — R52 Pain, unspecified: Secondary | ICD-10-CM

## 2019-08-22 DIAGNOSIS — D509 Iron deficiency anemia, unspecified: Secondary | ICD-10-CM | POA: Diagnosis present

## 2019-08-22 DIAGNOSIS — R509 Fever, unspecified: Secondary | ICD-10-CM

## 2019-08-22 DIAGNOSIS — F10229 Alcohol dependence with intoxication, unspecified: Secondary | ICD-10-CM | POA: Diagnosis present

## 2019-08-22 DIAGNOSIS — R000 Tachycardia, unspecified: Secondary | ICD-10-CM | POA: Diagnosis not present

## 2019-08-22 DIAGNOSIS — G4700 Insomnia, unspecified: Secondary | ICD-10-CM | POA: Diagnosis present

## 2019-08-22 DIAGNOSIS — K7290 Hepatic failure, unspecified without coma: Secondary | ICD-10-CM | POA: Diagnosis present

## 2019-08-22 DIAGNOSIS — F17210 Nicotine dependence, cigarettes, uncomplicated: Secondary | ICD-10-CM | POA: Diagnosis present

## 2019-08-22 DIAGNOSIS — K7460 Unspecified cirrhosis of liver: Secondary | ICD-10-CM | POA: Diagnosis present

## 2019-08-22 DIAGNOSIS — I959 Hypotension, unspecified: Secondary | ICD-10-CM

## 2019-08-22 DIAGNOSIS — E860 Dehydration: Secondary | ICD-10-CM

## 2019-08-22 DIAGNOSIS — R0602 Shortness of breath: Secondary | ICD-10-CM | POA: Diagnosis not present

## 2019-08-22 DIAGNOSIS — G600 Hereditary motor and sensory neuropathy: Secondary | ICD-10-CM | POA: Diagnosis present

## 2019-08-22 DIAGNOSIS — Z720 Tobacco use: Secondary | ICD-10-CM | POA: Diagnosis present

## 2019-08-22 DIAGNOSIS — J440 Chronic obstructive pulmonary disease with acute lower respiratory infection: Secondary | ICD-10-CM | POA: Diagnosis present

## 2019-08-22 DIAGNOSIS — Z8249 Family history of ischemic heart disease and other diseases of the circulatory system: Secondary | ICD-10-CM

## 2019-08-22 DIAGNOSIS — Z20822 Contact with and (suspected) exposure to covid-19: Secondary | ICD-10-CM | POA: Diagnosis present

## 2019-08-22 DIAGNOSIS — F10931 Alcohol use, unspecified with withdrawal delirium: Secondary | ICD-10-CM

## 2019-08-22 LAB — URINALYSIS, COMPLETE (UACMP) WITH MICROSCOPIC
Bacteria, UA: NONE SEEN
Bilirubin Urine: NEGATIVE
Glucose, UA: NEGATIVE mg/dL
Hgb urine dipstick: NEGATIVE
Ketones, ur: NEGATIVE mg/dL
Nitrite: NEGATIVE
Protein, ur: NEGATIVE mg/dL
Specific Gravity, Urine: 1.005 (ref 1.005–1.030)
pH: 6 (ref 5.0–8.0)

## 2019-08-22 LAB — CBC
HCT: 38.2 % (ref 36.0–46.0)
Hemoglobin: 12.1 g/dL (ref 12.0–15.0)
MCH: 27 pg (ref 26.0–34.0)
MCHC: 31.7 g/dL (ref 30.0–36.0)
MCV: 85.3 fL (ref 80.0–100.0)
Platelets: 55 10*3/uL — ABNORMAL LOW (ref 150–400)
RBC: 4.48 MIL/uL (ref 3.87–5.11)
RDW: 26.6 % — ABNORMAL HIGH (ref 11.5–15.5)
WBC: 5.4 10*3/uL (ref 4.0–10.5)
nRBC: 0 % (ref 0.0–0.2)

## 2019-08-22 LAB — URINE DRUG SCREEN, QUALITATIVE (ARMC ONLY)
Amphetamines, Ur Screen: NOT DETECTED
Barbiturates, Ur Screen: NOT DETECTED
Benzodiazepine, Ur Scrn: POSITIVE — AB
Cannabinoid 50 Ng, Ur ~~LOC~~: NOT DETECTED
Cocaine Metabolite,Ur ~~LOC~~: NOT DETECTED
MDMA (Ecstasy)Ur Screen: NOT DETECTED
Methadone Scn, Ur: NOT DETECTED
Opiate, Ur Screen: POSITIVE — AB
Phencyclidine (PCP) Ur S: NOT DETECTED
Tricyclic, Ur Screen: NOT DETECTED

## 2019-08-22 LAB — COMPREHENSIVE METABOLIC PANEL
ALT: 29 U/L (ref 0–44)
AST: 86 U/L — ABNORMAL HIGH (ref 15–41)
Albumin: 3.3 g/dL — ABNORMAL LOW (ref 3.5–5.0)
Alkaline Phosphatase: 92 U/L (ref 38–126)
Anion gap: 11 (ref 5–15)
BUN: 6 mg/dL (ref 6–20)
CO2: 19 mmol/L — ABNORMAL LOW (ref 22–32)
Calcium: 8.3 mg/dL — ABNORMAL LOW (ref 8.9–10.3)
Chloride: 105 mmol/L (ref 98–111)
Creatinine, Ser: <0.3 mg/dL — ABNORMAL LOW (ref 0.44–1.00)
Glucose, Bld: 135 mg/dL — ABNORMAL HIGH (ref 70–99)
Potassium: 4.6 mmol/L (ref 3.5–5.1)
Sodium: 135 mmol/L (ref 135–145)
Total Bilirubin: 2.9 mg/dL — ABNORMAL HIGH (ref 0.3–1.2)
Total Protein: 7.4 g/dL (ref 6.5–8.1)

## 2019-08-22 LAB — LACTIC ACID, PLASMA: Lactic Acid, Venous: 1.3 mmol/L (ref 0.5–1.9)

## 2019-08-22 LAB — POC SARS CORONAVIRUS 2 AG: SARS Coronavirus 2 Ag: NEGATIVE

## 2019-08-22 LAB — RESPIRATORY PANEL BY RT PCR (FLU A&B, COVID)
Influenza A by PCR: NEGATIVE
Influenza B by PCR: NEGATIVE
SARS Coronavirus 2 by RT PCR: NEGATIVE

## 2019-08-22 LAB — PROCALCITONIN: Procalcitonin: <0.1 ng/mL

## 2019-08-22 LAB — T4, FREE: Free T4: 1.06 ng/dL (ref 0.61–1.12)

## 2019-08-22 LAB — APTT: aPTT: 32 seconds (ref 24–36)

## 2019-08-22 LAB — PROTIME-INR
INR: 1.4 — ABNORMAL HIGH (ref 0.8–1.2)
Prothrombin Time: 16.8 seconds — ABNORMAL HIGH (ref 11.4–15.2)

## 2019-08-22 LAB — ETHANOL: Alcohol, Ethyl (B): <10 mg/dL (ref <10)

## 2019-08-22 LAB — AMMONIA: Ammonia: 55 umol/L — ABNORMAL HIGH (ref 9–35)

## 2019-08-22 LAB — LIPASE, BLOOD: Lipase: 27 U/L (ref 11–51)

## 2019-08-22 LAB — TSH: TSH: 0.62 u[IU]/mL (ref 0.350–4.500)

## 2019-08-22 SURGERY — ESOPHAGOGASTRODUODENOSCOPY (EGD) WITH PROPOFOL
Anesthesia: General

## 2019-08-22 MED ORDER — LORAZEPAM 2 MG/ML IJ SOLN
2.0000 mg | Freq: Once | INTRAMUSCULAR | Status: AC
  Administered 2019-08-22: 2 mg via INTRAVENOUS
  Filled 2019-08-22: qty 1

## 2019-08-22 MED ORDER — LORAZEPAM 2 MG/ML IJ SOLN
0.0000 mg | Freq: Four times a day (QID) | INTRAMUSCULAR | Status: DC
  Administered 2019-08-23: 1 mg via INTRAVENOUS
  Administered 2019-08-23: 2 mg via INTRAVENOUS
  Administered 2019-08-23: 1 mg via INTRAVENOUS
  Administered 2019-08-24 (×2): 2 mg via INTRAVENOUS
  Filled 2019-08-22 (×5): qty 1

## 2019-08-22 MED ORDER — SODIUM CHLORIDE 0.9 % IV SOLN
2.0000 g | Freq: Once | INTRAVENOUS | Status: AC
  Administered 2019-08-22: 2 g via INTRAVENOUS
  Filled 2019-08-22: qty 2

## 2019-08-22 MED ORDER — FOLIC ACID 1 MG PO TABS
1.0000 mg | ORAL_TABLET | Freq: Every day | ORAL | Status: DC
  Administered 2019-08-22 – 2019-09-01 (×11): 1 mg via ORAL
  Filled 2019-08-22 (×11): qty 1

## 2019-08-22 MED ORDER — ALBUTEROL SULFATE (2.5 MG/3ML) 0.083% IN NEBU: 2.5000 mg | INHALATION_SOLUTION | RESPIRATORY_TRACT | Status: DC | PRN

## 2019-08-22 MED ORDER — METRONIDAZOLE IN NACL 5-0.79 MG/ML-% IV SOLN
500.0000 mg | Freq: Once | INTRAVENOUS | Status: AC
  Administered 2019-08-22: 500 mg via INTRAVENOUS
  Filled 2019-08-22: qty 100

## 2019-08-22 MED ORDER — THIAMINE HCL 100 MG/ML IJ SOLN: 100.0000 mg | Freq: Every day | INTRAMUSCULAR | Status: DC

## 2019-08-22 MED ORDER — LORAZEPAM 2 MG/ML IJ SOLN: 0.0000 mg | Freq: Two times a day (BID) | INTRAMUSCULAR | Status: DC

## 2019-08-22 MED ORDER — THIAMINE HCL 100 MG PO TABS
100.0000 mg | ORAL_TABLET | Freq: Every day | ORAL | Status: DC
  Administered 2019-08-22 – 2019-08-23 (×2): 100 mg via ORAL
  Filled 2019-08-22 (×2): qty 1

## 2019-08-22 MED ORDER — GABAPENTIN 300 MG PO CAPS
300.0000 mg | ORAL_CAPSULE | Freq: Every day | ORAL | Status: DC
  Administered 2019-08-22: 300 mg via ORAL
  Filled 2019-08-22: qty 1

## 2019-08-22 MED ORDER — SODIUM CHLORIDE 0.9 % IV BOLUS (SEPSIS)
1000.0000 mL | Freq: Once | INTRAVENOUS | Status: AC
  Administered 2019-08-22: 1000 mL via INTRAVENOUS

## 2019-08-22 MED ORDER — ONDANSETRON HCL 4 MG/2ML IJ SOLN: 4.0000 mg | Freq: Three times a day (TID) | INTRAMUSCULAR | Status: DC | PRN

## 2019-08-22 MED ORDER — LACTULOSE ENEMA
300.0000 mL | Freq: Three times a day (TID) | ORAL | Status: DC
  Administered 2019-08-23 (×2): 300 mL via RECTAL
  Filled 2019-08-22 (×4): qty 300

## 2019-08-22 MED ORDER — LORAZEPAM 1 MG PO TABS
1.0000 mg | ORAL_TABLET | ORAL | Status: AC | PRN
  Administered 2019-08-22 – 2019-08-24 (×3): 1 mg via ORAL
  Administered 2019-08-24: 2 mg via ORAL
  Filled 2019-08-22: qty 2
  Filled 2019-08-22 (×3): qty 1

## 2019-08-22 MED ORDER — SODIUM CHLORIDE 0.9 % IV SOLN: INTRAVENOUS | Status: DC

## 2019-08-22 MED ORDER — LORAZEPAM 2 MG/ML IJ SOLN
1.0000 mg | INTRAMUSCULAR | Status: AC | PRN
  Administered 2019-08-25: 2 mg via INTRAVENOUS
  Filled 2019-08-22: qty 1

## 2019-08-22 MED ORDER — NICOTINE 21 MG/24HR TD PT24
21.0000 mg | MEDICATED_PATCH | Freq: Every day | TRANSDERMAL | Status: DC
  Administered 2019-08-24 – 2019-08-25 (×2): 21 mg via TRANSDERMAL
  Filled 2019-08-22 (×8): qty 1

## 2019-08-22 MED ORDER — OXYCODONE HCL 5 MG PO TABS
5.0000 mg | ORAL_TABLET | Freq: Four times a day (QID) | ORAL | Status: DC | PRN
  Administered 2019-08-22 – 2019-08-23 (×2): 5 mg via ORAL
  Filled 2019-08-22 (×3): qty 1

## 2019-08-22 MED ORDER — VANCOMYCIN HCL IN DEXTROSE 1-5 GM/200ML-% IV SOLN
1000.0000 mg | Freq: Once | INTRAVENOUS | Status: AC
  Administered 2019-08-22: 1000 mg via INTRAVENOUS
  Filled 2019-08-22: qty 200

## 2019-08-22 MED ORDER — SUCRALFATE 1 G PO TABS
1.0000 g | ORAL_TABLET | Freq: Three times a day (TID) | ORAL | Status: DC
  Administered 2019-08-22 – 2019-09-01 (×35): 1 g via ORAL
  Filled 2019-08-22 (×38): qty 1

## 2019-08-22 MED ORDER — FERROUS SULFATE 325 (65 FE) MG PO TABS
325.0000 mg | ORAL_TABLET | Freq: Two times a day (BID) | ORAL | Status: DC
  Administered 2019-08-22 – 2019-09-01 (×20): 325 mg via ORAL
  Filled 2019-08-22 (×20): qty 1

## 2019-08-22 MED ORDER — IOHEXOL 350 MG/ML SOLN
75.0000 mL | Freq: Once | INTRAVENOUS | Status: AC | PRN
  Administered 2019-08-22: 75 mL via INTRAVENOUS

## 2019-08-22 MED ORDER — ADULT MULTIVITAMIN W/MINERALS CH
1.0000 | ORAL_TABLET | Freq: Every day | ORAL | Status: DC
  Administered 2019-08-22 – 2019-09-01 (×11): 1 via ORAL
  Filled 2019-08-22 (×11): qty 1

## 2019-08-22 MED ORDER — PANTOPRAZOLE SODIUM 40 MG PO TBEC
40.0000 mg | DELAYED_RELEASE_TABLET | Freq: Every day | ORAL | Status: DC
  Administered 2019-08-22 – 2019-09-01 (×11): 40 mg via ORAL
  Filled 2019-08-22 (×11): qty 1

## 2019-08-22 MED ORDER — ADULT MULTIVITAMIN W/MINERALS CH: 1.0000 | ORAL_TABLET | Freq: Every day | ORAL | Status: DC

## 2019-08-22 NOTE — ED Notes (Signed)
Pt to Ct

## 2019-08-22 NOTE — Anesthesia Preprocedure Evaluation (Signed)
Anesthesia Evaluation  Patient identified by MRN, date of birth, ID band Patient awake    Reviewed: Allergy & Precautions, H&P , NPO status , Patient's Chart, lab work & pertinent test results  History of Anesthesia Complications Negative for: history of anesthetic complications  Airway        Dental   Pulmonary COPD, Current Smoker and Patient abstained from smoking.,           Cardiovascular hypertension, Pt. on medications + angina   Pt reports intermittent chest pain.  She is followed by cardiology for this.  No acute worsening.  TTE 2020: 1. The left ventricle has normal systolic function, with an ejection  fraction of 55-60%. The cavity size was normal. Left ventricular diastolic  parameters were normal.  2. The right ventricle has normal systolic function. The cavity was  normal. There is no increase in right ventricular wall thickness.  3. The mitral valve is grossly normal.  4. The aortic valve is tricuspid. Aortic valve regurgitation is mild by  color flow Doppler.  5. The aorta is normal unless otherwise noted.  6. The interatrial septum was not assessed.    Neuro/Psych PSYCHIATRIC DISORDERS Anxiety Depression    GI/Hepatic (+) Cirrhosis     substance abuse  alcohol use, Hepatitis -  Endo/Other  negative endocrine ROS  Renal/GU negative Renal ROS  negative genitourinary   Musculoskeletal   Abdominal   Peds  Hematology  (+) anemia , thrombocytopenia   Anesthesia Other Findings Past Medical History: No date: Back injury No date: Cervical cancer ([facility]) 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 No date: COPD (chronic obstructive pulmonary disease) ([facility]) No date: Family history of adverse reaction to anesthesia     Comment:  PONV No date: GERD (gastroesophageal reflux disease) No date: Hepatitis No date: Hypertension No date: Leg injury No date: Liver cirrhosis ([place])  Past Surgical History: No  date: BACK SURGERY No date: LEG SURGERY  BMI    Body Mass Index: 22.68 kg/m      Reproductive/Obstetrics negative OB ROS                             Anesthesia Physical  Anesthesia Plan  ASA: IV  Anesthesia Plan:    Post-op Pain Management:    Induction:   PONV Risk Score and Plan:   Airway Management Planned:   Additional Equipment:   Intra-op Plan:   Post-operative Plan:   Informed Consent: I have reviewed the patients History and Physical, chart, labs and discussed the procedure including the risks, benefits and alternatives for the proposed anesthesia with the patient or authorized representative who has indicated his/her understanding and acceptance.     Dental Advisory Given  Plan Discussed with: Anesthesiologist, CRNA and Surgeon  Anesthesia Plan Comments: (Pt went to PAT yesterday in preparation for upcoming knee replacement surgery, and potassium was noted to be 2.5 at that time.  Pt has not been taking her potassium supplementation and is likely chronically low.  Pt states her potassium prescription is filled at the pharmacy and she was planning to pick it up today.  She has a F/U appt with cardiology tomorrow.  Recommend that her EGD be rescheduled until her electrolytes have been normalized.  Will defer to her specialists regarding her electrolyte management.  Emphasized the need to pick up and take her potassium supplementation today and to F/U with cardiology tomorrow.  Pt expressed understanding and is in agreement with this  plan.  KLF)        Anesthesia Quick Evaluation
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
## 2019-08-22 NOTE — ED Notes (Signed)
Pt placed on 2L nasal cannula at this time per MD [facility] order.

## 2019-08-22 NOTE — Progress Notes (Signed)
PHARMACY -  BRIEF ANTIBIOTIC NOTE   Pharmacy has received consult(s) for vancomycin and cefepime from an ED provider.  The patient's profile has been reviewed for ht/wt/allergies/indication/available labs.    One time order(s) placed for vanc 1 g + cefepime 2 g  Further antibiotics/pharmacy consults should be ordered by admitting physician if indicated.                       Thank you,  Tawnya Crook, PharmD 08/22/2019  7:46 AM

## 2019-08-22 NOTE — ED Notes (Signed)
Taken to floor by transport team. Pt clean and dry upon leaving department. Pt taken with all of her belongings including clothes, jewelry and purse.

## 2019-08-22 NOTE — Sepsis Progress Note (Signed)
Code Sepsis monitoring discontinued due to cancellation.  

## 2019-08-22 NOTE — ED Notes (Signed)
Unable to given report at this time.

## 2019-08-22 NOTE — Progress Notes (Signed)
Admitted to room 208 around 6p. Answers questions, and oriented x4. C/O pain in right leg. 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 Sweaty. O2 2 l [place]. Cardiac monitor. Bed alarm on.
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
## 2019-08-22 NOTE — ED Notes (Signed)
Report to [place], Therapist, sports

## 2019-08-22 NOTE — Progress Notes (Signed)
CODE SEPSIS - PHARMACY COMMUNICATION  **Broad Spectrum Antibiotics should be administered within 1 hour of Sepsis diagnosis**  Time Code Sepsis Called/Page Received: [id number]  Antibiotics Ordered: vanc/cefepime/metronidazole  Time of 1st antibiotic administration: 0808  Additional action taken by pharmacy: NA  If necessary, Name of Provider/Nurse Contacted: NA    Tawnya Crook ,PharmD Clinical Pharmacist  08/22/2019  8:12 AM

## 2019-08-22 NOTE — ED Provider Notes (Signed)
[facility] Emergency Department Provider Note  ____________________________________________  Time seen: Approximately 8:27 AM  I have reviewed the triage vital signs and the nursing notes.   HISTORY  Chief Complaint Hallucinations    Level 5 Caveat: Portions of the History and Physical including HPI and review of systems are unable to be completely obtained due to patient confusion/hallucinations   HPI Rhonda Martin is a 44 y.o. female with a history of COPD GERD hypertension cirrhosis alcohol abuse who comes to the ED complaining of visual hallucinations seeing lots of people in the room who are stabbing her.  Denies auditory hallucinations.  Started last night.  Constant, no aggravating or alleviating factors.  Reports she normally is to drink heavily with vodka but has cut back recently drinking only a few beers a day.  She does have a history of alcohol withdrawal.  EMS report a fever of 100.6.  Patient denies chest pain shortness of breath belly pain vomiting diarrhea or dysuria.      Past Medical History:  Diagnosis Date  . Back injury   . Cervical cancer ([place])   . Charcot-Marie-Tooth disease   . COPD (chronic obstructive pulmonary disease) ([place])   . Family history of adverse reaction to anesthesia    PONV  . GERD (gastroesophageal reflux disease)   . Hepatitis   . Hypertension   . IDA (iron deficiency anemia) 06/26/2019  . Leg injury   . Liver cirrhosis ([place])   . Symptomatic anemia 06/26/2019     Patient Active Problem List   Diagnosis Date Noted  . Hallucination 08/22/2019  . Acute respiratory failure with hypoxia ([place]) 08/22/2019  . Sepsis ([place]) 07/10/2019  . CAP (community acquired pneumonia) 07/10/2019  . Abdominal pain 07/10/2019  . Hyponatremia 07/10/2019  . Hypokalemia 07/10/2019  . Anxiety 07/10/2019  . Chest pain 07/10/2019  . COPD (chronic obstructive pulmonary disease) ([place]) 07/10/2019  . HTN (hypertension) 07/10/2019   . Liver cirrhosis ([place])   . Symptomatic anemia 06/26/2019  . IDA (iron deficiency anemia) 06/26/2019  . Insomnia 04/03/2019  . Tachycardia 04/03/2019  . Chronic pain of right knee 03/31/2019  . Bilateral leg edema 02/06/2019  . Generalized anxiety disorder 01/23/2019  . 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 Alcohol withdrawal syndrome ([place]) 01/20/2019  . Chronic hepatitis C without hepatic coma ([place]) 12/20/2018  . Thrombocytopenia ([place]) 12/20/2018  . Alcohol abuse 12/20/2018  . Transaminitis 12/20/2018  . Tobacco abuse 12/20/2018  . Easy bruising 12/20/2018  . Other fatigue 12/20/2018  . Folate deficiency 12/20/2018  . Hepatitis B core antibody negative 12/20/2018  . Charcot-Marie-Tooth disease      Past Surgical History:  Procedure Laterality Date  . BACK SURGERY    . LEG SURGERY       Prior to Admission medications   Medication Sig Start Date End Date Taking? Authorizing Provider  ferrous sulfate 325 (65 FE) MG EC tablet Take 1 tablet (325 mg total) by mouth 2 (two) times daily. 08/09/19  Yes Earlie Server, MD  gabapentin (NEURONTIN) 300 MG capsule TAKE 1-3 CAPSULES BY MOUTH DAILY AS NEEDED 07/18/19  Yes Volney American, PA-C  hydrOXYzine (ATARAX/VISTARIL) 25 MG tablet Take 1 tablet (25 mg total) by mouth 3 (three) times daily as needed. Patient taking differently: Take 25 mg by mouth 3 (three) times daily as needed for anxiety or itching.  06/13/19  Yes Volney American, PA-C  metoprolol succinate (TOPROL-XL) 25 MG 24 hr tablet TAKE 1 TABLET BY MOUTH EVERY DAY  07/20/19  Yes Volney American, PA-C  Multiple Vitamin (MULTIVITAMIN WITH MINERALS) TABS tablet Take 1 tablet by mouth daily. 01/27/19  Yes Epifanio Lesches, MD  oxyCODONE (OXY IR/ROXICODONE) 5 MG immediate release tablet Take 5 mg by mouth 4 (four) times daily as needed for moderate pain or severe pain. 06/07/19  Yes [provider]  pantoprazole (PROTONIX) 40 MG tablet Take 1 tablet (40 mg total) by  mouth daily. 08/10/19  Yes Volney American, PA-C  potassium chloride SA (KLOR-CON M20) 20 MEQ tablet TAKE 1 TABLET (20 MEQ TOTAL) BY MOUTH 3 (THREE) TIMES DAILY. 08/11/19  Yes Volney American, PA-C  spironolactone (ALDACTONE) 25 MG tablet Take 1 tablet (25 mg total) by mouth daily. 08/10/19  Yes Volney American, PA-C  sucralfate (CARAFATE) 1 g tablet Take 1 tablet (1 g total) by mouth 4 (four) times daily -  with meals and at bedtime. 06/13/19  Yes Volney American, PA-C  zolpidem (AMBIEN) 5 MG tablet Take 1 tablet (5 mg total) by mouth at bedtime as needed for sleep. 06/15/19  Yes Volney American, PA-C  Blood Pressure Monitor MISC For automatic blood pressure cuff. 04/06/19   End, Harrell Gave, MD  triamcinolone cream (KENALOG) 0.1 % Apply 1 application topically 2 (two) times daily. Patient taking differently: Apply 1 application topically 2 (two) times daily as needed (skin irritation).  03/28/19   Volney American, PA-C     Allergies Patient has no known allergies.   Family History  Problem Relation Age of Onset  . Diabetes Mother   . Hypertension Mother   . Cancer Father        unknown what kind of cancer   . Hypertension Sister   . Hypertension Brother   . Heart attack Brother 33    Social History Social History   Tobacco Use  . Smoking status: Current Every Day Smoker    Packs/day: 0.50    Years: 30.00    Pack years: 15.00    Types: Cigarettes  . Smokeless tobacco: Never Used  Substance Use Topics  . Alcohol use: Yes    Alcohol/week: 10.0 standard drinks    Types: 10 Cans of beer per week  . Drug use: Not Currently    Review of Systems Level 5 Caveat: Portions of the History and Physical including HPI and review of systems are unable to be completely obtained due to patient being a poor historian   Constitutional:   No known fever.  ENT:   No rhinorrhea. Cardiovascular:   No chest pain or syncope. Respiratory:   No dyspnea or  cough. Gastrointestinal:   Negative for abdominal pain, vomiting and diarrhea.  Musculoskeletal:   Chronic right knee and ankle pain after prior injuries ____________________________________________   PHYSICAL EXAM:  VITAL SIGNS: ED Triage Vitals  Enc Vitals Group     BP 08/22/19 0719 137/83     Pulse Rate 08/22/19 0719 (!) 143     Resp 08/22/19 0719 (!) 22     Temp 08/22/19 0719 99.6 F (37.6 C)     Temp Source 08/22/19 0719 Oral     SpO2 08/22/19 0719 97 %     Weight 08/22/19 0720 127 lb 13.9 oz (58 kg)     Height 08/22/19 0720 5\' 2"  (1.575 m)     Head Circumference --      Peak Flow --      Pain Score 08/22/19 0720 8     Pain Loc --  Pain Edu? --      Excl. in [place]? --     Vital signs reviewed, nursing assessments reviewed.   Constitutional:   Alert and oriented to person and place.  Ill-appearing Eyes:   Conjunctivae are normal. EOMI. PERRL. ENT      Head:   Normocephalic and atraumatic.      Nose:   No congestion/rhinnorhea.       Mouth/Throat:   Dry mucous membranes, no pharyngeal erythema. No peritonsillar mass.       Neck:   No meningismus. Full ROM.  Thyroid nonpalpable Hematological/Lymphatic/Immunilogical:   No cervical lymphadenopathy. Cardiovascular:   Tachycardia heart rate 140. Symmetric bilateral radial and DP pulses.  No murmurs. Cap refill less than 2 seconds. Respiratory: Tachypnea with normal work of breathing.  No wheezes or crackles. Gastrointestinal:   Soft and nontender. Non distended. There is no CVA tenderness.  No rebound, rigidity, or guarding. Musculoskeletal:   Normal range of motion in all extremities.  Chronic appearing deformity of right knee with joint effusion.  No focal lower extremity tenderness.  Trace pitting edema bilateral lower extremities.  Symmetric calf circumference. Neurologic:   Normal speech, wandering tangential language.  Motor grossly intact. No acute focal neurologic deficits are appreciated.  Skin:    Skin is warm,  dry and intact. No rash noted.  No petechiae, purpura, or bullae.  No inflammatory skin changes, no fluctuance erythema crepitus.  No wounds.  ____________________________________________    LABS (pertinent positives/negatives) (all labs ordered are listed, but only abnormal results are displayed) Labs Reviewed  COMPREHENSIVE METABOLIC PANEL - Abnormal; Notable for the following components:      Result Value   CO2 19 (*)    Glucose, Bld 135 (*)    Creatinine, Ser <0.30 (*)    Calcium 8.3 (*)    Albumin 3.3 (*)    AST 86 (*)    Total Bilirubin 2.9 (*)    All other components within normal limits  CBC - Abnormal; Notable for the following components:   RDW 26.6 (*)    Platelets 55 (*)    All other components within normal limits  URINE DRUG SCREEN, QUALITATIVE ([facility] ONLY) - Abnormal; Notable for the following components:   Opiate, Ur Screen POSITIVE (*)    Benzodiazepine, Ur Scrn POSITIVE (*)    All other components within normal limits  PROTIME-INR - Abnormal; Notable for the following components:   Prothrombin Time 16.8 (*)    INR 1.4 (*)    All other components within normal limits  URINALYSIS, COMPLETE (UACMP) WITH MICROSCOPIC - Abnormal; Notable for the following components:   Color, Urine YELLOW (*)    APPearance CLEAR (*)    Leukocytes,Ua TRACE (*)    All other components within normal limits  CULTURE, BLOOD (ROUTINE X 2)  CULTURE, BLOOD (ROUTINE X 2)  URINE CULTURE  RESPIRATORY PANEL BY RT PCR (FLU A&B, COVID)  ETHANOL  LACTIC ACID, PLASMA  LIPASE, BLOOD  PROCALCITONIN  APTT  TSH  T4, FREE  POC SARS CORONAVIRUS 2 AG -  ED  POC SARS CORONAVIRUS 2 AG   ____________________________________________   EKG  Interpreted by me Sinus tachycardia rate 130.  Normal axis and intervals.  Normal QRS ST segments and T waves.  ____________________________________________    [id number]  DG Chest Port 1 View  Result Date: 08/22/2019 CLINICAL DATA:  Hallucinations.  EXAM: PORTABLE CHEST 1 VIEW COMPARISON:  The CT abdomen/pelvis 07/10/2019, chest radiograph 07/10/2019 FINDINGS: Heart size within  normal limits. Previously demonstrated bilateral airspace disease has essentially resolved as compared to prior exam 07/10/2019. Minimal ill-defined opacity within the bilateral lung bases may reflect atelectasis. No evidence of pleural effusion or pneumothorax. No acute bony abnormality. IMPRESSION: Previously demonstrated bilateral airspace disease has essentially resolved since prior exam 07/10/2019. Minimal ill-defined opacity within the bilateral lung bases has an appearance most suggestive of atelectasis. Electronically Signed   By: Kellie Simmering DO   On: 08/22/2019 08:38    ____________________________________________   PROCEDURES .Critical Care Performed by: Carrie Mew, MD Authorized by: Carrie Mew, MD   Critical care provider statement:    Critical care time (minutes):  35   Critical care time was exclusive of:  Separately billable procedures and treating other patients   Critical care was necessary to treat or prevent imminent or life-threatening deterioration of the following conditions:  Sepsis, shock and CNS failure or compromise   Critical care was time spent personally by me on the following activities:  Development of treatment plan with patient or surrogate, discussions with consultants, evaluation of patient's response to treatment, examination of patient, obtaining history from patient or surrogate, ordering and performing treatments and interventions, ordering and review of laboratory studies, ordering and review of radiographic studies, pulse oximetry, re-evaluation of patient's condition and review of old charts    ____________________________________________  DIFFERENTIAL DIAGNOSIS   Alcohol withdrawal, dehydration, sepsis  CLINICAL IMPRESSION / ASSESSMENT AND PLAN / ED COURSE  Medications ordered in the ED: Medications   ondansetron (ZOFRAN) injection 4 mg (has no administration in time range)  LORazepam (ATIVAN) tablet 1-4 mg (has no administration in time range)    Or  LORazepam (ATIVAN) injection 1-4 mg (has no administration in time range)  thiamine tablet 100 mg (has no administration in time range)    Or  thiamine (B-1) injection 100 mg (has no administration in time range)  folic acid (FOLVITE) tablet 1 mg (has no administration in time range)  multivitamin with minerals tablet 1 tablet (has no administration in time range)  LORazepam (ATIVAN) injection 0-4 mg (has no administration in time range)    Followed by  LORazepam (ATIVAN) injection 0-4 mg (has no administration in time range)  nicotine (NICODERM CQ - dosed in mg/24 hours) patch 21 mg (has no administration in time range)  ceFEPIme (MAXIPIME) 2 g in sodium chloride 0.9 % 100 mL IVPB (0 g Intravenous Stopped 08/22/19 0841)  metroNIDAZOLE (FLAGYL) IVPB 500 mg (0 mg Intravenous Stopped 08/22/19 0939)  vancomycin (VANCOCIN) IVPB 1000 mg/200 mL premix (0 mg Intravenous Stopped 08/22/19 0938)  sodium chloride 0.9 % bolus 1,000 mL (0 mLs Intravenous Stopped 08/22/19 0938)    And  sodium chloride 0.9 % bolus 1,000 mL (0 mLs Intravenous Stopped 08/22/19 0853)  LORazepam (ATIVAN) injection 2 mg (2 mg Intravenous Given 08/22/19 0815)    Pertinent labs & imaging results that were available during my care of the patient were reviewed by me and considered in my medical decision making (see chart for details).   DAIJANAE ANGELOPOULOS was evaluated in Emergency Department on 08/22/2019 for the symptoms described in the history of present illness. She was evaluated in the context of the global COVID-19 pandemic, which necessitated consideration that the patient might be at risk for infection with the SARS-CoV-2 virus that causes COVID-19. Institutional protocols and algorithms that pertain to the evaluation of patients at risk for COVID-19 are in a state of rapid change  based on information released by regulatory bodies  including the CDC and federal and state organizations. These policies and algorithms were followed during the patient's care in the ED.   Patient presents with visual hallucinations and disorientation, suspect due to delirium from alcohol withdrawal or sepsis.  Will give IV fluids, empiric antibiotics of vancomycin cefepime metronidazole, check lab panel.  We will also add on thyroid studies.  Clinical Course as of Aug 22 1302  Tue Aug 22, 2019  1008 Sepsis work-up is negative.  Normal chest x-ray and urinalysis.  Normal white blood cell count and lactate.  Negative procalcitonin.  Reassuring lab panel.  We will cancel code sepsis at this time.  Most likely alcohol withdrawal and polysubstance abuse related and dehydration.   [PS]  1233 Pt so2 88% while sleeping. Will start 2l [place]. Still tachycardic to 115 despite 2L IVF hydration. TFTs normal. Will admit for alc. Withdrawal with delirium   [PS]    Clinical Course User Index [PS] Carrie Mew, MD     ____________________________________________   FINAL CLINICAL IMPRESSION(S) / ED DIAGNOSES    Final diagnoses:  Alcohol withdrawal syndrome, with delirium ([place])  Dehydration     ED Discharge Orders    None      Portions of this note were generated with dragon dictation software. Dictation errors may occur despite best attempts at proofreading.   Carrie Mew, MD 08/22/19 1304

## 2019-08-22 NOTE — ED Notes (Signed)
Pt clean and dry. Awakens easy and assisted to swallow pills and drink water. Denies further needs currently.

## 2019-08-22 NOTE — ED Notes (Addendum)
purewick placed on patient by Deneise Lever, RN. Pt clean and dry at time of placement

## 2019-08-22 NOTE — ED Notes (Signed)
Pt clean and dry. Denies further needs. Asked to update family. This RN called emergency contact listed as mother with no answer, unable to leave voicemail.

## 2019-08-23 ENCOUNTER — Ambulatory Visit: Payer: Medicaid Other | Admitting: Licensed Clinical Social Worker

## 2019-08-23 ENCOUNTER — Observation Stay: Payer: Medicaid Other

## 2019-08-23 DIAGNOSIS — D709 Neutropenia, unspecified: Secondary | ICD-10-CM | POA: Diagnosis not present

## 2019-08-23 DIAGNOSIS — Z981 Arthrodesis status: Secondary | ICD-10-CM | POA: Diagnosis not present

## 2019-08-23 DIAGNOSIS — F419 Anxiety disorder, unspecified: Secondary | ICD-10-CM | POA: Diagnosis not present

## 2019-08-23 DIAGNOSIS — I959 Hypotension, unspecified: Secondary | ICD-10-CM

## 2019-08-23 DIAGNOSIS — D696 Thrombocytopenia, unspecified: Secondary | ICD-10-CM

## 2019-08-23 DIAGNOSIS — A419 Sepsis, unspecified organism: Secondary | ICD-10-CM | POA: Diagnosis not present

## 2019-08-23 DIAGNOSIS — R7881 Bacteremia: Secondary | ICD-10-CM | POA: Diagnosis not present

## 2019-08-23 DIAGNOSIS — K7200 Acute and subacute hepatic failure without coma: Secondary | ICD-10-CM

## 2019-08-23 DIAGNOSIS — K7682 Hepatic encephalopathy: Secondary | ICD-10-CM | POA: Diagnosis present

## 2019-08-23 DIAGNOSIS — G92 Toxic encephalopathy: Secondary | ICD-10-CM | POA: Diagnosis not present

## 2019-08-23 DIAGNOSIS — M25561 Pain in right knee: Secondary | ICD-10-CM

## 2019-08-23 DIAGNOSIS — I10 Essential (primary) hypertension: Secondary | ICD-10-CM | POA: Diagnosis present

## 2019-08-23 DIAGNOSIS — Z20822 Contact with and (suspected) exposure to covid-19: Secondary | ICD-10-CM | POA: Diagnosis present

## 2019-08-23 DIAGNOSIS — R443 Hallucinations, unspecified: Secondary | ICD-10-CM | POA: Diagnosis not present

## 2019-08-23 DIAGNOSIS — J9601 Acute respiratory failure with hypoxia: Secondary | ICD-10-CM | POA: Diagnosis not present

## 2019-08-23 DIAGNOSIS — S8991XA Unspecified injury of right lower leg, initial encounter: Secondary | ICD-10-CM | POA: Diagnosis not present

## 2019-08-23 DIAGNOSIS — R6520 Severe sepsis without septic shock: Secondary | ICD-10-CM | POA: Diagnosis not present

## 2019-08-23 DIAGNOSIS — K7290 Hepatic failure, unspecified without coma: Secondary | ICD-10-CM | POA: Diagnosis present

## 2019-08-23 DIAGNOSIS — R509 Fever, unspecified: Secondary | ICD-10-CM | POA: Diagnosis not present

## 2019-08-23 DIAGNOSIS — Z8541 Personal history of malignant neoplasm of cervix uteri: Secondary | ICD-10-CM | POA: Diagnosis not present

## 2019-08-23 DIAGNOSIS — D509 Iron deficiency anemia, unspecified: Secondary | ICD-10-CM | POA: Diagnosis present

## 2019-08-23 DIAGNOSIS — B377 Candidal sepsis: Secondary | ICD-10-CM | POA: Diagnosis not present

## 2019-08-23 DIAGNOSIS — Z809 Family history of malignant neoplasm, unspecified: Secondary | ICD-10-CM | POA: Diagnosis not present

## 2019-08-23 DIAGNOSIS — Z79899 Other long term (current) drug therapy: Secondary | ICD-10-CM | POA: Diagnosis not present

## 2019-08-23 DIAGNOSIS — R0602 Shortness of breath: Secondary | ICD-10-CM | POA: Diagnosis not present

## 2019-08-23 DIAGNOSIS — J440 Chronic obstructive pulmonary disease with acute lower respiratory infection: Secondary | ICD-10-CM | POA: Diagnosis present

## 2019-08-23 DIAGNOSIS — K7030 Alcoholic cirrhosis of liver without ascites: Secondary | ICD-10-CM | POA: Diagnosis not present

## 2019-08-23 DIAGNOSIS — Z8249 Family history of ischemic heart disease and other diseases of the circulatory system: Secondary | ICD-10-CM | POA: Diagnosis not present

## 2019-08-23 DIAGNOSIS — G4700 Insomnia, unspecified: Secondary | ICD-10-CM | POA: Diagnosis present

## 2019-08-23 DIAGNOSIS — G9341 Metabolic encephalopathy: Secondary | ICD-10-CM | POA: Diagnosis not present

## 2019-08-23 DIAGNOSIS — F17210 Nicotine dependence, cigarettes, uncomplicated: Secondary | ICD-10-CM | POA: Diagnosis present

## 2019-08-23 DIAGNOSIS — S99911A Unspecified injury of right ankle, initial encounter: Secondary | ICD-10-CM | POA: Diagnosis not present

## 2019-08-23 DIAGNOSIS — F10231 Alcohol dependence with withdrawal delirium: Secondary | ICD-10-CM | POA: Diagnosis present

## 2019-08-23 DIAGNOSIS — R5081 Fever presenting with conditions classified elsewhere: Secondary | ICD-10-CM | POA: Diagnosis not present

## 2019-08-23 DIAGNOSIS — K219 Gastro-esophageal reflux disease without esophagitis: Secondary | ICD-10-CM | POA: Diagnosis present

## 2019-08-23 DIAGNOSIS — R918 Other nonspecific abnormal finding of lung field: Secondary | ICD-10-CM | POA: Diagnosis not present

## 2019-08-23 DIAGNOSIS — M1711 Unilateral primary osteoarthritis, right knee: Secondary | ICD-10-CM | POA: Diagnosis not present

## 2019-08-23 DIAGNOSIS — K7460 Unspecified cirrhosis of liver: Secondary | ICD-10-CM | POA: Diagnosis not present

## 2019-08-23 DIAGNOSIS — Z833 Family history of diabetes mellitus: Secondary | ICD-10-CM | POA: Diagnosis not present

## 2019-08-23 DIAGNOSIS — E860 Dehydration: Secondary | ICD-10-CM | POA: Diagnosis present

## 2019-08-23 DIAGNOSIS — J181 Lobar pneumonia, unspecified organism: Secondary | ICD-10-CM | POA: Diagnosis present

## 2019-08-23 DIAGNOSIS — R441 Visual hallucinations: Secondary | ICD-10-CM | POA: Diagnosis not present

## 2019-08-23 DIAGNOSIS — F10229 Alcohol dependence with intoxication, unspecified: Secondary | ICD-10-CM | POA: Diagnosis present

## 2019-08-23 HISTORY — DX: Hepatic encephalopathy: K76.82

## 2019-08-23 LAB — TECHNOLOGIST SMEAR REVIEW

## 2019-08-23 LAB — CBC
HCT: 34.8 % — ABNORMAL LOW (ref 36.0–46.0)
Hemoglobin: 11.4 g/dL — ABNORMAL LOW (ref 12.0–15.0)
MCH: 27.5 pg (ref 26.0–34.0)
MCHC: 32.8 g/dL (ref 30.0–36.0)
MCV: 84.1 fL (ref 80.0–100.0)
Platelets: 50 10*3/uL — ABNORMAL LOW (ref 150–400)
RBC: 4.14 MIL/uL (ref 3.87–5.11)
RDW: 26.5 % — ABNORMAL HIGH (ref 11.5–15.5)
WBC: 2.7 10*3/uL — ABNORMAL LOW (ref 4.0–10.5)
nRBC: 0 % (ref 0.0–0.2)

## 2019-08-23 LAB — BASIC METABOLIC PANEL
Anion gap: 4 — ABNORMAL LOW (ref 5–15)
BUN: 8 mg/dL (ref 6–20)
CO2: 25 mmol/L (ref 22–32)
Calcium: 8 mg/dL — ABNORMAL LOW (ref 8.9–10.3)
Chloride: 113 mmol/L — ABNORMAL HIGH (ref 98–111)
Creatinine, Ser: 0.39 mg/dL — ABNORMAL LOW (ref 0.44–1.00)
GFR calc Af Amer: >60 mL/min (ref >60)
GFR calc non Af Amer: >60 mL/min (ref >60)
Glucose, Bld: 107 mg/dL — ABNORMAL HIGH (ref 70–99)
Potassium: 4 mmol/L (ref 3.5–5.1)
Sodium: 142 mmol/L (ref 135–145)

## 2019-08-23 LAB — GLUCOSE, CAPILLARY: Glucose-Capillary: 131 mg/dL — ABNORMAL HIGH (ref 70–99)

## 2019-08-23 LAB — URINE CULTURE: Culture: NO GROWTH

## 2019-08-23 LAB — PATHOLOGIST SMEAR REVIEW

## 2019-08-23 MED ORDER — SODIUM CHLORIDE 0.9 % IV SOLN
2.0000 g | Freq: Every day | INTRAVENOUS | Status: DC
  Administered 2019-08-23 – 2019-08-25 (×3): 2 g via INTRAVENOUS
  Filled 2019-08-23: qty 20
  Filled 2019-08-23: qty 2
  Filled 2019-08-23: qty 20
  Filled 2019-08-23: qty 2

## 2019-08-23 MED ORDER — OXYCODONE HCL 5 MG PO TABS
10.0000 mg | ORAL_TABLET | Freq: Four times a day (QID) | ORAL | Status: DC | PRN
  Administered 2019-08-23 – 2019-08-24 (×3): 10 mg via ORAL
  Filled 2019-08-23 (×3): qty 2

## 2019-08-23 MED ORDER — AZITHROMYCIN 250 MG PO TABS
250.0000 mg | ORAL_TABLET | Freq: Every day | ORAL | Status: DC
  Administered 2019-08-24 – 2019-08-25 (×2): 250 mg via ORAL
  Filled 2019-08-23 (×2): qty 1

## 2019-08-23 MED ORDER — THIAMINE HCL 100 MG/ML IJ SOLN
500.0000 mg | Freq: Three times a day (TID) | INTRAVENOUS | Status: DC
  Administered 2019-08-23 – 2019-08-27 (×12): 500 mg via INTRAVENOUS
  Filled 2019-08-23 (×15): qty 5

## 2019-08-23 MED ORDER — HYDROMORPHONE HCL 1 MG/ML IJ SOLN
0.5000 mg | Freq: Once | INTRAMUSCULAR | Status: AC
  Administered 2019-08-23: 0.5 mg via INTRAVENOUS
  Filled 2019-08-23: qty 0.5

## 2019-08-23 MED ORDER — LACTULOSE 10 GM/15ML PO SOLN
30.0000 g | Freq: Two times a day (BID) | ORAL | Status: DC
  Administered 2019-08-23 (×2): 30 g via ORAL
  Filled 2019-08-23 (×2): qty 60

## 2019-08-23 MED ORDER — AZITHROMYCIN 250 MG PO TABS
500.0000 mg | ORAL_TABLET | Freq: Every day | ORAL | Status: AC
  Administered 2019-08-23: 500 mg via ORAL
  Filled 2019-08-23: qty 2
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
## 2019-08-23 NOTE — Progress Notes (Signed)
PT Cancellation Note  Patient Details Name: Rhonda Martin MRN: [id number] DOB: 1975-11-23   Cancelled Treatment:    Reason Eval/Treat Not Completed: Fatigue/lethargy limiting ability to participate. Pt in bed upon entry, appears very lethargic, slurring words, trailing off, difficulty keeping eyes open for more than 1-2 seconds. RN reports pt recently given ativan d/t agitation. Will hold PT evaluation until patient is less impaired, more able to safely participate.    3:05 PM, 08/23/19 Etta Grandchild, PT, DPT Physical Therapist - [facility]  [phone] ([place])     [place] C 08/23/2019, 3:03 PM
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
## 2019-08-24 DIAGNOSIS — D509 Iron deficiency anemia, unspecified: Secondary | ICD-10-CM

## 2019-08-24 DIAGNOSIS — J181 Lobar pneumonia, unspecified organism: Secondary | ICD-10-CM

## 2019-08-24 DIAGNOSIS — G4700 Insomnia, unspecified: Secondary | ICD-10-CM

## 2019-08-24 LAB — CBC WITH DIFFERENTIAL/PLATELET
Abs Immature Granulocytes: 0.01 10*3/uL (ref 0.00–0.07)
Basophils Absolute: 0 10*3/uL (ref 0.0–0.1)
Basophils Relative: 2 %
Eosinophils Absolute: 0.1 10*3/uL (ref 0.0–0.5)
Eosinophils Relative: 5 %
HCT: 31 % — ABNORMAL LOW (ref 36.0–46.0)
Hemoglobin: 10.3 g/dL — ABNORMAL LOW (ref 12.0–15.0)
Immature Granulocytes: 0 %
Lymphocytes Relative: 37 %
Lymphs Abs: 0.9 10*3/uL (ref 0.7–4.0)
MCH: 27.8 pg (ref 26.0–34.0)
MCHC: 33.2 g/dL (ref 30.0–36.0)
MCV: 83.8 fL (ref 80.0–100.0)
Monocytes Absolute: 0.3 10*3/uL (ref 0.1–1.0)
Monocytes Relative: 12 %
Neutro Abs: 1.1 10*3/uL — ABNORMAL LOW (ref 1.7–7.7)
Neutrophils Relative %: 44 %
Platelets: 59 10*3/uL — ABNORMAL LOW (ref 150–400)
RBC: 3.7 MIL/uL — ABNORMAL LOW (ref 3.87–5.11)
RDW: 26.3 % — ABNORMAL HIGH (ref 11.5–15.5)
WBC: 2.5 10*3/uL — ABNORMAL LOW (ref 4.0–10.5)
nRBC: 0 % (ref 0.0–0.2)

## 2019-08-24 LAB — AMMONIA: Ammonia: 37 umol/L — ABNORMAL HIGH (ref 9–35)

## 2019-08-24 LAB — TROPONIN I (HIGH SENSITIVITY): Troponin I (High Sensitivity): 6 ng/L (ref <18)

## 2019-08-24 LAB — GLUCOSE, CAPILLARY: Glucose-Capillary: 92 mg/dL (ref 70–99)

## 2019-08-24 MED ORDER — TRAZODONE HCL 50 MG PO TABS
50.0000 mg | ORAL_TABLET | Freq: Every day | ORAL | Status: DC
  Administered 2019-08-24: 50 mg via ORAL
  Filled 2019-08-24: qty 1

## 2019-08-24 MED ORDER — OXYCODONE HCL 5 MG PO TABS
10.0000 mg | ORAL_TABLET | Freq: Four times a day (QID) | ORAL | Status: DC | PRN
  Administered 2019-08-24 – 2019-08-26 (×5): 10 mg via ORAL
  Filled 2019-08-24 (×5): qty 2

## 2019-08-24 MED ORDER — SODIUM CHLORIDE 0.9% FLUSH
10.0000 mL | Freq: Two times a day (BID) | INTRAVENOUS | Status: DC
  Administered 2019-08-24 – 2019-09-01 (×13): 10 mL

## 2019-08-24 MED ORDER — SODIUM CHLORIDE 0.9% FLUSH: 10.0000 mL | INTRAVENOUS | Status: DC | PRN

## 2019-08-24 NOTE — Progress Notes (Signed)
Will try immobilizer for right knee, may help alignment and stability

## 2019-08-24 NOTE — Progress Notes (Addendum)
Asking for pain medicine every hour. Explained that the pain medicine is 4 times a day as needed and she can't have it every hour. She says she goes to the pain clinic and they "give her 20 mg 5 times a day". Stating "I don't want to stay awake, I want to sleep". Encouraged her to sit in chair, watch tv, read, talk to chaplain, and her response... starts mumbling  low keyed incomprehensible words.

## 2019-08-24 NOTE — Progress Notes (Signed)
PT Cancellation Note  Patient Details Name: Rhonda Martin MRN: [id number] DOB: [birth year]   Cancelled Treatment:    Reason Eval/Treat Not Completed: Other (comment)(Chart reviewed, RN consulted. Pt reports a sleepless night, feels exhausted, refuses any mobiltiy at this time. Will attempt again at later date/time.)  11:02 AM, 08/24/19 Etta Grandchild, PT, DPT Physical Therapist - [facility]  [phone] ([place])    [place] [facility] 08/24/2019, 11:02 AM
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
## 2019-08-25 DIAGNOSIS — K7030 Alcoholic cirrhosis of liver without ascites: Secondary | ICD-10-CM

## 2019-08-25 LAB — CBC
HCT: 29.4 % — ABNORMAL LOW (ref 36.0–46.0)
Hemoglobin: 10 g/dL — ABNORMAL LOW (ref 12.0–15.0)
MCH: 28.5 pg (ref 26.0–34.0)
MCHC: 34 g/dL (ref 30.0–36.0)
MCV: 83.8 fL (ref 80.0–100.0)
Platelets: 63 10*3/uL — ABNORMAL LOW (ref 150–400)
RBC: 3.51 MIL/uL — ABNORMAL LOW (ref 3.87–5.11)
RDW: 26.1 % — ABNORMAL HIGH (ref 11.5–15.5)
WBC: 2.6 10*3/uL — ABNORMAL LOW (ref 4.0–10.5)
nRBC: 0 % (ref 0.0–0.2)

## 2019-08-25 LAB — GLUCOSE, CAPILLARY
Glucose-Capillary: 108 mg/dL — ABNORMAL HIGH (ref 70–99)
Glucose-Capillary: 126 mg/dL — ABNORMAL HIGH (ref 70–99)

## 2019-08-25 LAB — AMMONIA: Ammonia: 64 umol/L — ABNORMAL HIGH (ref 9–35)

## 2019-08-25 MED ORDER — IBUPROFEN 400 MG PO TABS
600.0000 mg | ORAL_TABLET | Freq: Once | ORAL | Status: AC
  Administered 2019-08-25: 600 mg via ORAL
  Filled 2019-08-25 (×2): qty 2

## 2019-08-25 MED ORDER — HALOPERIDOL LACTATE 5 MG/ML IJ SOLN
1.0000 mg | Freq: Four times a day (QID) | INTRAMUSCULAR | Status: DC | PRN
  Administered 2019-08-25: 1 mg via INTRAVENOUS
  Filled 2019-08-25: qty 1

## 2019-08-25 MED ORDER — LACTULOSE 10 GM/15ML PO SOLN
20.0000 g | Freq: Two times a day (BID) | ORAL | Status: DC
  Administered 2019-08-25 – 2019-08-27 (×5): 20 g via ORAL
  Filled 2019-08-25 (×6): qty 30

## 2019-08-25 MED ORDER — QUETIAPINE FUMARATE 25 MG PO TABS
25.0000 mg | ORAL_TABLET | Freq: Every day | ORAL | Status: DC
  Administered 2019-08-25 – 2019-08-31 (×7): 25 mg via ORAL
  Filled 2019-08-25 (×8): qty 1

## 2019-08-25 MED ORDER — LORAZEPAM 1 MG PO TABS
1.0000 mg | ORAL_TABLET | Freq: Four times a day (QID) | ORAL | Status: DC | PRN
  Administered 2019-08-25 – 2019-09-01 (×19): 1 mg via ORAL
  Filled 2019-08-25 (×19): qty 1

## 2019-08-25 MED ORDER — HALOPERIDOL 2 MG PO TABS
2.0000 mg | ORAL_TABLET | Freq: Four times a day (QID) | ORAL | Status: DC | PRN
  Administered 2019-08-25 – 2019-08-26 (×2): 2 mg via ORAL
  Filled 2019-08-25 (×4): qty 1

## 2019-08-25 NOTE — Progress Notes (Signed)
[name], female   DOB: [birth year], 44 y.o.   MRN: [id number] [facility] PROGRESS NOTE  TORINA THRUSH [id number] DOB: 09/26/1975 DOA: 08/22/2019 PCP: Volney American, PA-C  HPI/Subjective: The patient stated the people that brought her here have moved in next-door.  They are actually doing construction and a few rooms away.  The patient also yelled out to get away from her room and no one was there.  The patient states that she is short of breath and coughing.  She is very anxious.  Patient very difficult to focus today  Objective: Vitals:   08/25/19 0339 08/25/19 1236  BP: 123/75 (!) 105/56  Pulse: 100 (!) 109  Resp: 18 13  Temp: 99.3 F (37.4 C)   SpO2: 98%     Intake/Output Summary (Last 24 hours) at 08/25/2019 1418 Last data filed at 08/25/2019 1100 Gross per 24 hour  Intake 864.05 ml  Output 400 ml  Net 464.05 ml   Filed Weights   08/22/19 0720  Weight: 58 kg    ROS: Review of Systems  Unable to perform ROS: Acuity of condition  Respiratory: Positive for cough and shortness of breath.   Cardiovascular: Positive for chest pain.  Gastrointestinal: Positive for nausea. Negative for abdominal pain, constipation and vomiting.  Genitourinary: Negative for dysuria.  Musculoskeletal: Positive for joint pain.  Neurological: Negative for headaches.  Psychiatric/Behavioral: Positive for hallucinations. The patient is nervous/anxious and has insomnia.    Exam: Physical Exam  HENT:  Nose: No mucosal edema.  Mouth/Throat: No oropharyngeal exudate or posterior oropharyngeal edema.  Eyes: Conjunctivae and lids are normal.  Neck: Carotid bruit is not present.  Cardiovascular: S1 normal and S2 normal. Exam reveals no gallop.  No murmur heard. Respiratory: No respiratory distress. She has no wheezes. She has no rhonchi. She has no rales.  GI: Soft. Bowel sounds are normal. There is no abdominal tenderness.  Musculoskeletal:     Right knee:  Swelling present. Decreased range of motion.     Right ankle: No swelling.     Left ankle: No swelling.  Lymphadenopathy:    She has no cervical adenopathy.  Neurological: She is alert. No cranial nerve deficit.  Skin: Skin is warm. No rash noted. Nails show no clubbing.  Psychiatric: Thought content is paranoid.      Data Reviewed: Basic Metabolic Panel: Recent Labs  Lab 08/22/19 0705 08/23/19 0450  NA 135 142  K 4.6 4.0  CL 105 113*  CO2 19* 25  GLUCOSE 135* 107*  BUN 6 8  CREATININE <0.30* 0.39*  CALCIUM 8.3* 8.0*   Liver Function Tests: Recent Labs  Lab 08/22/19 0705  AST 86*  ALT 29  ALKPHOS 92  BILITOT 2.9*  PROT 7.4  ALBUMIN 3.3*   Recent Labs  Lab 08/22/19 0753  LIPASE 27   Recent Labs  Lab 08/22/19 1455 08/24/19 0847 08/25/19 0525  AMMONIA 55* 37* 64*   CBC: Recent Labs  Lab 08/22/19 0705 08/23/19 0450 08/24/19 0644 08/25/19 0525  WBC 5.4 2.7* 2.5* 2.6*  NEUTROABS  --   --  1.1*  --   HGB 12.1 11.4* 10.3* 10.0*  HCT 38.2 34.8* 31.0* 29.4*  MCV 85.3 84.1 83.8 83.8  PLT 55* 50* 59* 63*    CBG: Recent Labs  Lab 08/23/19 0759 08/24/19 0745 08/25/19 0754 08/25/19 1144  GLUCAP 131* 92 108* 126*    Recent Results (from the past 240 hour(s))  SARS CORONAVIRUS 2 (TAT 6-24  HRS) Nasopharyngeal Nasopharyngeal Swab     Status: None   Collection Time: 08/18/19 11:51 AM   Specimen: Nasopharyngeal Swab  Result Value Ref Range Status   SARS Coronavirus 2 NEGATIVE NEGATIVE Final    Comment: (NOTE) SARS-CoV-2 target nucleic acids are NOT DETECTED. The SARS-CoV-2 RNA is generally detectable in upper and lower respiratory specimens during the acute phase of infection. Negative results do not preclude SARS-CoV-2 infection, do not rule out co-infections with other pathogens, and should not be used as the sole basis for treatment or other patient management decisions. Negative results must be combined with clinical observations, patient  history, and epidemiological information. The expected result is Negative. Fact Sheet for Patients: SugarRoll.be Fact Sheet for Healthcare Providers: https://www.woods-mathews.com/ This test is not yet approved or cleared by the [place] FDA and  has been authorized for detection and/or diagnosis of SARS-CoV-2 by FDA under an Emergency Use Authorization (EUA). This EUA will remain  in effect (meaning this test can be used) for the duration of the COVID-19 declaration under Section 56 4(b)(1) of the Act, 21 U.S.C. section 360bbb-3(b)(1), unless the authorization is terminated or revoked sooner. Performed at [facility], [place] [address] [place], [place] [address]   Blood Culture (routine x 2)     Status: None (Preliminary result)   Collection Time: 08/22/19  7:50 AM   Specimen: BLOOD  Result Value Ref Range Status   Specimen Description BLOOD LEFT ANTECUBITAL  Final   Special Requests   Final    BOTTLES DRAWN AEROBIC AND ANAEROBIC Blood Culture adequate volume   Culture   Final    NO GROWTH 3 DAYS Performed at [facility], [address], [place], [place] [address]    Report Status PENDING  Incomplete  Urine culture     Status: None   Collection Time: 08/22/19  7:50 AM   Specimen: In/Out Cath Urine  Result Value Ref Range Status   Specimen Description   Final    IN/OUT CATH URINE Performed at [facility], [address] [place], [place] [address]    Special Requests   Final    NONE Performed at [facility], [address] [place], [place] [address]    Culture   Final    NO GROWTH Performed at [facility], [place] [address], [place], [place] [address]    Report Status 08/23/2019 FINAL  Final  Blood Culture (routine x 2)     Status: None (Preliminary result)   Collection Time: 08/22/19  7:53 AM   Specimen: BLOOD  Result Value Ref Range Status   Specimen Description BLOOD LEFT HAND   Final   Special Requests   Final    BOTTLES DRAWN AEROBIC AND ANAEROBIC Blood Culture adequate volume   Culture   Final    NO GROWTH 3 DAYS Performed at [facility], [address] [place], [place] [address]    Report Status PENDING  Incomplete  Respiratory Panel by RT PCR (Flu A&B, Covid) - Nasopharyngeal Swab     Status: None   Collection Time: 08/22/19 11:11 AM   Specimen: Nasopharyngeal Swab  Result Value Ref Range Status   SARS Coronavirus 2 by RT PCR NEGATIVE NEGATIVE Final    Comment: (NOTE) SARS-CoV-2 target nucleic acids are NOT DETECTED. The SARS-CoV-2 RNA is generally detectable in upper respiratoy specimens during the acute phase of infection. The lowest concentration of SARS-CoV-2 viral copies this assay can detect is 131 copies/mL.  A negative result does not preclude SARS-Cov-2 infection and should not be used as the sole basis for treatment or other patient management decisions. A negative result may occur with  improper specimen collection/handling, submission of specimen other than nasopharyngeal swab, presence of viral mutation(s) within the areas targeted by this assay, and inadequate number of viral copies (<131 copies/mL). A negative result must be combined with clinical observations, patient history, and epidemiological information. The expected result is Negative. Fact Sheet for Patients:  PinkCheek.be Fact Sheet for Healthcare Providers:  GravelBags.it This test is not yet ap proved or cleared by the [place] FDA and  has been authorized for detection and/or diagnosis of SARS-CoV-2 by FDA under an Emergency Use Authorization (EUA). This EUA will remain  in effect (meaning this test can be used) for the duration of the COVID-19 declaration under Section 564(b)(1) of the Act, 21 U.S.C. section 360bbb-3(b)(1), unless the authorization is terminated or revoked sooner.    Influenza A by  PCR NEGATIVE NEGATIVE Final   Influenza B by PCR NEGATIVE NEGATIVE Final    Comment: (NOTE) The Xpert Xpress SARS-CoV-2/FLU/RSV assay is intended as an aid in  the diagnosis of influenza from Nasopharyngeal swab specimens and  should not be used as a sole basis for treatment. Nasal washings and  aspirates are unacceptable for Xpert Xpress SARS-CoV-2/FLU/RSV  testing. Fact Sheet for Patients: PinkCheek.be Fact Sheet for Healthcare Providers: GravelBags.it This test is not yet approved or cleared by the [place] FDA and  has been authorized for detection and/or diagnosis of SARS-CoV-2 by  FDA under an Emergency Use Authorization (EUA). This EUA will remain  in effect (meaning this test can be used) for the duration of the  Covid-19 declaration under Section 564(b)(1) of the Act, 21  U.S.C. section 360bbb-3(b)(1), unless the authorization is  terminated or revoked. Performed at [facility], [address] [place], [place] [address]      Scheduled Meds: . azithromycin  250 mg Oral Daily  . ferrous sulfate  325 mg Oral BID  . folic acid  1 mg Oral Daily  . lactulose  20 g Oral BID  . multivitamin with minerals  1 tablet Oral Daily  . nicotine  21 mg Transdermal Daily  . pantoprazole  40 mg Oral Daily  . QUEtiapine  25 mg Oral QHS  . sodium chloride flush  10-40 mL Intracatheter Q12H  . sucralfate  1 g Oral TID WC & HS   Continuous Infusions: . cefTRIAXone (ROCEPHIN)  IV 2 g (08/25/19 1034)  . thiamine injection 500 mg (08/25/19 [id number])    Assessment/Plan:  1. Hallucinations here in the hospital.  Likely combination of pain meds and drinking alcohol.  Continue high-dose thiamine just in case this is Wernicke's encephalopathy. Start IV Haldol and Seroquel at night to get the patient sleeping.  Psychiatric consultation.  Possibility of hepatic encephalopathy.  Ammonia level little higher this morning will  restart lactulose. 2. Liver cirrhosis with thrombocytopenia.  No schistocytes seen on peripheral smear.  Platelet count starting to come up a little bit.  The further away from alcohol the better. 3. Right knee and ankle pain, worse after a fall.  Right knee showing soft tissue swelling concerning for hematoma or contusion.  Appreciate orthopedic consultation.  Advised physical therapy with knee brace.  Need to see the patient walk prior to disposition.  Pain medication every 6 hours as needed only. 4. Alcohol abuse.  Currently no signs of withdrawal.  Patient  on alcohol withdrawal protocol. 5. Iron deficiency anemia.  On oral iron. 6. Acute hypoxic respiratory failure.  Patient tapered off oxygen.  Possible lobar pneumonia.  Continue empiric antibiotic of Rocephin and Zithromax. 7. Relative hypotension.  Holding cardiac meds at this time.  Discontinue IV fluids. 8. Anxiety and insomnia.  Trial of Seroquel at night.  Psychiatric consultation for hallucinations  Code Status:     Code Status Orders  (From admission, onward)         Start     Ordered   08/22/19 1456  Full code  Continuous     08/22/19 1457        Code Status History    Date Active Date Inactive Code Status Order ID Comments User Context   07/10/2019 1934 07/17/2019 1851 Full Code [id number]  Ivor Costa, MD ED   02/06/2019 1815 02/08/2019 1805 Full Code [id number]  Vaughan Basta, MD Inpatient   01/20/2019 1321 01/27/2019 1710 Full Code [id number]  Lang Snow, NP ED   Advance Care Planning Activity     Family Communication: Spoke with husband Nathaneil Canary on the phone [phone]. Disposition Plan: With Hallucination today, will need more time in the hospital.  Will get psychiatric consult.  Trial of Haldol and Seroquel at night to get the patient sleeping.  Acute delirium will have to clear prior to any disposition.  Likely will need at least a few more days in the hospital  Consultants:  Orthopedic  surgery  Psychiatry  Antibiotics:  Rocephin  Zithromax  Time spent: 26 minutes  [place]

## 2019-08-25 NOTE — Progress Notes (Signed)
PT Cancellation Note  Patient Details Name: Rhonda Martin MRN: [id number] DOB: 30-Mar-1976   Cancelled Treatment:    Reason Eval/Treat Not Completed: Medical issues which prohibited therapy;Patient's level of consciousness. Pt in bed, very anxious, intermittent crying, asking for ativan. C/o sleeplessness/pain. Very loud oscillating multi-tool 2 rooms over for construction is now being incorporated in patient's confabulation narrative. "I know they're doing that on purpose, I know they are! Those are my new neighbors. They trying to get me." Dicussed with attending. RN notified of medication request from patient. Will return again later in day to attempt to evaluate once again.   9:43 AM, 08/25/19 Etta Grandchild, PT, DPT Physical Therapist - [facility]  [phone] ([place])    [place] C 08/25/2019, 9:41 AM

## 2019-08-25 NOTE — Plan of Care (Signed)

## 2019-08-25 NOTE — Consult Note (Signed)
[facility] Psychiatry Consult   Reason for Consult: Visual hallucinations Referring Physician: Dr. Leslye Peer Patient Identification: Rhonda Martin MRN:  [id number] Principal Diagnosis: Hallucination Diagnosis:  Principal Problem:   Hallucination Active Problems:   Alcohol abuse   Tobacco abuse   Acute pain of right knee   IDA (iron deficiency anemia)   Anxiety   COPD (chronic obstructive pulmonary disease) (HCC)   Liver cirrhosis (HCC)   HTN (hypertension)   Acute respiratory failure with hypoxia ([place])   Acute metabolic encephalopathy   Acute hepatic encephalopathy   Hepatic encephalopathy (HCC)   Hypotension   Lobar pneumonia ([place])   Total Time spent with patient: 45 minutes  Subjective:   Rhonda Martin is a 44 y.o. female patient who presents with altered mental status.  HPI: Patient is a 44 year old female with a history of liver cirrhosis who presents with AMS.  Per report patient believes people are stabbing her in her home.  This was thought to be a hallucination by her immediate family.  Upon coming to the hospital patient continued to endorse some symptoms of hallucinations as well as exhibiting extreme anxiety.  Psychiatry was consulted due to the visual hallucinations.  Patient was calm and cooperative upon evaluation although appeared somewhat sedated.  She denied any prior history of major psychiatric issues but does acknowledge a history of anxiety in the past.  Patient is also endorses continued drinking despite her liver cirrhosis.  She states that she was drinking about a third of a gallon of vodka per day but had brought herself down to that much in several days.  It was explained to patient that this is an excessive amount of alcohol to which the body can develop tolerance and cause potential withdrawal symptoms after sustaining use.  Patient in acknowledgment.  Currently patient is denying any suicidal or homicidal ideation.  She denying any psychotic symptoms  and none are evident.  She does still have believes that the psychotic symptoms she was experiencing in the past several days were still true but was able to be reeducated as to what could have caused this.  Patient was able to state at the end of it "I think my mind is playing tricks on me".  Patient is however thankful for the progress she has made in the past couple days.    Past Psychiatric History: Patient reports history of anxiety.  Denies any prior suicide attempts.  Denies any prior psychiatric hospitalizations.  Denies any prior trials of antipsychotic medication or any prior episodes of psychosis.  Risk to Self:  No Risk to Others:  No Prior Inpatient Therapy:  No Prior Outpatient Therapy:  No  Past Medical History:  Past Medical History:  Diagnosis Date  . Back injury   . Cervical cancer ([place])   . Charcot-Marie-Tooth disease   . COPD (chronic obstructive pulmonary disease) ([place])   . Family history of adverse reaction to anesthesia    PONV  . GERD (gastroesophageal reflux disease)   . Hepatitis   . Hypertension   . IDA (iron deficiency anemia) 06/26/2019  . Leg injury   . Liver cirrhosis ([place])   . Symptomatic anemia 06/26/2019    Past Surgical History:  Procedure Laterality Date  . BACK SURGERY    . LEG SURGERY     Family History:  Family History  Problem Relation Age of Onset  . Diabetes Mother   . Hypertension Mother   . Cancer Father  unknown what kind of cancer   . Hypertension Sister   . Hypertension Brother   . Heart attack Brother 79   Family Psychiatric  History: Denies Social History:  Social History   Substance and Sexual Activity  Alcohol Use Yes  . Alcohol/week: 10.0 standard drinks  . Types: 10 Cans of beer per week     Social History   Substance and Sexual Activity  Drug Use Not Currently    Social History   Socioeconomic History  . Marital status: Significant Other    Spouse name: Not on file  . Number of children: Not on  file  . Years of education: Not on file  . Highest education level: Not on file  Occupational History  . Not on file  Tobacco Use  . Smoking status: Current Every Day Smoker    Packs/day: 0.50    Years: 30.00    Pack years: 15.00    Types: Cigarettes  . Smokeless tobacco: Never Used  Substance and Sexual Activity  . Alcohol use: Yes    Alcohol/week: 10.0 standard drinks    Types: 10 Cans of beer per week  . Drug use: Not Currently  . Sexual activity: Not Currently    Birth control/protection: I.U.D.  Other Topics Concern  . Not on file  Social History Narrative  . Not on file   Social Determinants of Health   Financial Resource Strain: Low Risk   . Difficulty of Paying Living Expenses: Not hard at all  Food Insecurity: No Food Insecurity  . Worried About Charity fundraiser in the Last Year: Never true  . Ran Out of Food in the Last Year: Never true  Transportation Needs: Unknown  . Lack of Transportation (Medical): No  . Lack of Transportation (Non-Medical): Not on file  Physical Activity: Sufficiently Active  . Days of Exercise per Week: 7 days  . Minutes of Exercise per Session: 30 min  Stress: Stress Concern Present  . Feeling of Stress : Very much  Social Connections: Slightly Isolated  . Frequency of Communication with Friends and Family: More than three times a week  . Frequency of Social Gatherings with Friends and Family: Three times a week  . Attends Religious Services: More than 4 times per year  . Active Member of Clubs or Organizations: No  . Attends Archivist Meetings: Never  . Marital Status: Living with partner   Additional Social History:    Allergies:  No Known Allergies  Labs:  Results for orders placed or performed during the hospital encounter of 08/22/19 (from the past 48 hour(s))  CBC with Differential/Platelet     Status: Abnormal   Collection Time: 08/24/19  6:44 AM  Result Value Ref Range   WBC 2.5 (L) 4.0 - 10.5 K/uL    RBC 3.70 (L) 3.87 - 5.11 MIL/uL   Hemoglobin 10.3 (L) 12.0 - 15.0 g/dL   HCT 31.0 (L) 36.0 - 46.0 %   MCV 83.8 80.0 - 100.0 fL   MCH 27.8 26.0 - 34.0 pg   MCHC 33.2 30.0 - 36.0 g/dL   RDW 26.3 (H) 11.5 - 15.5 %   Platelets 59 (L) 150 - 400 K/uL    Comment: Immature Platelet Fraction may be clinically indicated, consider ordering this additional test [id number]    nRBC 0.0 0.0 - 0.2 %   Neutrophils Relative % 44 %   Neutro Abs 1.1 (L) 1.7 - 7.7 K/uL   Lymphocytes Relative 37 %  Lymphs Abs 0.9 0.7 - 4.0 K/uL   Monocytes Relative 12 %   Monocytes Absolute 0.3 0.1 - 1.0 K/uL   Eosinophils Relative 5 %   Eosinophils Absolute 0.1 0.0 - 0.5 K/uL   Basophils Relative 2 %   Basophils Absolute 0.0 0.0 - 0.1 K/uL   Immature Granulocytes 0 %   Abs Immature Granulocytes 0.01 0.00 - 0.07 K/uL    Comment: Performed at [facility], [address] [place], [place] [address]  Glucose, capillary     Status: None   Collection Time: 08/24/19  7:45 AM  Result Value Ref Range   Glucose-Capillary 92 70 - 99 mg/dL    Comment: Glucose reference range applies only to samples taken after fasting for at least 8 hours.  Ammonia     Status: Abnormal   Collection Time: 08/24/19  8:47 AM  Result Value Ref Range   Ammonia 37 (H) 9 - 35 umol/L    Comment: Performed at [facility], [place], [place] [address]  Troponin I (High Sensitivity)     Status: None   Collection Time: 08/24/19  3:55 PM  Result Value Ref Range   Troponin I (High Sensitivity) 6 <18 ng/L    Comment: (NOTE) Elevated high sensitivity troponin I (hsTnI) values and significant  changes across serial measurements may suggest ACS but many other  chronic and acute conditions are known to elevate hsTnI results.  Refer to the "Links" section for chest pain algorithms and additional  guidance. Performed at [facility], [address] [place], [place] [address]   CBC     Status: Abnormal    Collection Time: 08/25/19  5:25 AM  Result Value Ref Range   WBC 2.6 (L) 4.0 - 10.5 K/uL   RBC 3.51 (L) 3.87 - 5.11 MIL/uL   Hemoglobin 10.0 (L) 12.0 - 15.0 g/dL   HCT 29.4 (L) 36.0 - 46.0 %   MCV 83.8 80.0 - 100.0 fL   MCH 28.5 26.0 - 34.0 pg   MCHC 34.0 30.0 - 36.0 g/dL   RDW 26.1 (H) 11.5 - 15.5 %   Platelets 63 (L) 150 - 400 K/uL    Comment: Immature Platelet Fraction may be clinically indicated, consider ordering this additional test [id number] CONSISTENT WITH PREVIOUS RESULT    nRBC 0.0 0.0 - 0.2 %    Comment: Performed at [facility], [address] [place], [place] [address]  Ammonia     Status: Abnormal   Collection Time: 08/25/19  5:25 AM  Result Value Ref Range   Ammonia 64 (H) 9 - 35 umol/L    Comment: Performed at [facility], [address] [place],  [address]  Glucose, capillary     Status: Abnormal   Collection Time: 08/25/19  7:54 AM  Result Value Ref Range   Glucose-Capillary 108 (H) 70 - 99 mg/dL    Comment: Glucose reference range applies only to samples taken after fasting for at least 8 hours.   Comment 1 Notify RN   Glucose, capillary     Status: Abnormal   Collection Time: 08/25/19 11:44 AM  Result Value Ref Range   Glucose-Capillary 126 (H) 70 - 99 mg/dL    Comment: Glucose reference range applies only to samples taken after fasting for at least 8 hours.   Comment 1 Notify RN     Current Facility-Administered Medications  Medication Dose Route Frequency Provider Last Rate Last Admin  . albuterol (PROVENTIL) (  2.5 MG/3ML) 0.083% nebulizer solution 2.5 mg  2.5 mg Nebulization Q4H PRN Ivor Costa, MD      . azithromycin [facility]) tablet 250 mg  250 mg Oral Daily Loletha Grayer, MD   250 mg at 08/25/19 1020  . cefTRIAXone (ROCEPHIN) 2 g in sodium chloride 0.9 % 100 mL IVPB  2 g Intravenous Daily Loletha Grayer, MD   Stopped at 08/25/19 1104  . ferrous sulfate tablet 325 mg  325 mg Oral BID Ivor Costa, MD   325 mg at  08/25/19 1020  . folic acid (FOLVITE) tablet 1 mg  1 mg Oral Daily Ivor Costa, MD   1 mg at 08/25/19 1020  . haloperidol lactate (HALDOL) injection 1 mg  1 mg Intravenous Q6H PRN Loletha Grayer, MD   1 mg at 08/25/19 1021  . ibuprofen (ADVIL) tablet 600 mg  600 mg Oral Once Loletha Grayer, MD      . lactulose (CHRONULAC) 10 GM/15ML solution 20 g  20 g Oral BID Loletha Grayer, MD   20 g at 08/25/19 1021  . multivitamin with minerals tablet 1 tablet  1 tablet Oral Daily Ivor Costa, MD   1 tablet at 08/25/19 1021  . nicotine (NICODERM CQ - dosed in mg/24 hours) patch 21 mg  21 mg Transdermal Daily Ivor Costa, MD   21 mg at 08/25/19 1030  . ondansetron (ZOFRAN) injection 4 mg  4 mg Intravenous Q8H PRN Ivor Costa, MD      . oxyCODONE (Oxy IR/ROXICODONE) immediate release tablet 10 mg  10 mg Oral Q6H PRN Loletha Grayer, MD   10 mg at 08/25/19 0616  . pantoprazole (PROTONIX) EC tablet 40 mg  40 mg Oral Daily Ivor Costa, MD   40 mg at 08/25/19 1021  . QUEtiapine (SEROQUEL) tablet 25 mg  25 mg Oral QHS Wieting, Richard, MD      . sodium chloride flush (NS) 0.9 % injection 10-40 mL  10-40 mL Intracatheter Q12H Leslye Peer, Richard, MD   10 mL at 08/25/19 1034  . sodium chloride flush (NS) 0.9 % injection 10-40 mL  10-40 mL Intracatheter PRN Wieting, Richard, MD      . sucralfate (CARAFATE) tablet 1 g  1 g Oral TID WC & HS Ivor Costa, MD   1 g at 08/25/19 1020  . thiamine 500mg  in normal saline (53ml) IVPB  500 mg Intravenous Q8H Wieting, Richard, MD 100 mL/hr at 08/25/19 1624 500 mg at 08/25/19 1624    Musculoskeletal: Strength & Muscle Tone: within normal limits Gait & Station: normal Patient leans: N/A  Psychiatric Specialty Exam: Physical Exam  Review of Systems  Blood pressure (!) 102/52, pulse (!) 108, temperature 99.9 F (37.7 C), temperature source Oral, resp. rate 16, height 5\' 2"  (1.575 m), weight 58 kg, SpO2 92 %.Body mass index is 23.39 kg/m.  General Appearance: Casual  Eye  Contact:  Fair  Speech:  Slow and Slurred  Volume:  Decreased  Mood:  Euthymic  Affect:  Appropriate  Thought Process:  Coherent  Orientation:  Full (Time, Place, and Person)  Thought Content:  Logical  Suicidal Thoughts:  No  Homicidal Thoughts:  No  Memory:  Recent;   Fair  Judgement:  Intact  Insight:  Fair  Psychomotor Activity:  Normal  Concentration:  Concentration: Fair  Recall:  [employer] of Knowledge:  Fair  Language:  Fair  Akathisia:  No  Handed:  Right  AIMS (if indicated):     Assets:  Communication  Skills Desire for Improvement Resilience Social Support  ADL's:  Impaired  Cognition:  WNL  Sleep:        Treatment Plan Summary: 44 year old patient presenting with altered mental status.  Patient does seem to be making some improvements as currently not exhibiting any hallucinations or other psychotic-like symptoms.  From chart review however patient was experiencing concerning symptoms of visual hallucinations and paranoia.  It is likely that the symptoms are caused by a multifactorial delirium.  Contributing factors include alcohol withdrawal, hepatic encephalopathy, electrolyte disturbances, and were further complicated by nonbenzodiazepine hypnotic use.  Plan: 1 mg Ativan p.o. as needed every 6 hours for anxiety 2.5 mg p.o. Haldol p.o. as needed every 6 hours for agitation  Continue to search and eliminate causes of delirium  Disposition: No evidence of imminent risk to self or others at present.   Patient does not meet criteria for psychiatric inpatient admission. Supportive therapy provided about ongoing stressors.  Dixie Dials, MD 08/25/2019 5:09 PM

## 2019-08-25 NOTE — Evaluation (Signed)
Physical Therapy Evaluation Patient Details Name: Rhonda Martin MRN: [id number] DOB: Nov 08, 1975 Today's Date: 08/25/2019   History of Present Illness  Rhonda Martin is a 82yoF who [facility] on 3/[facility] c severe pain in knee and ankle. Pt has Charcot knee and is s/p ankle fushion. Pt also having hallucinations upon arrival. Noted knee swelling, orthopedics following. PMH: Liver cirrhosis, ETOH abuse, Charcot knee, s/p Ankle fusion, hypertension, COPD, GERD, anxiety, iron deficiency anemia, cervical cancer, tobacco abuse, thrombocytopenia  Clinical Impression  Pt admitted with above diagnosis. Pt currently with functional limitations due to the deficits listed below (see "PT Problem List"). Pt is familiar to author from multiple prior admissions, also several prior attempts to evaluate this week. Upon entry, pt in bed, asleep but easily made awake and agreeable to participate. Pt received ativan and haldol this AM after onset panic attack, has been asleep all day thus far (also sleepless and anxious overnight), remains heavily impaired throughout session. Impaired state likely contributory to impulsivity, gross motor coordination deficits, and decreased safety awareness. Pt requires heavy cues for redirection at times for safety, sometimes unable to redirect. This occurred prior to ability to trial KI for OOB mobility, however orthopedics has asked that KI be trialed in gait to help with chronic valgus instability and buckling/pain. Pt reports to have 2 KI at home that she uses. Pt able to perform bed mobility and transfers with supervision to minGuard assist due to AMS as above. AMB in hall takes more regular minA for frequent loss of trunk stability and intermittent RW position correction. Pt is tachycardic in AMB consistently 130-131bpm, 110s-120s at rest. Functional mobility  assessment demonstrates increased effort/time requirements, poor tolerance, and need for physical assistance, whereas the patient  performed these at a higher level of independence PTA.Pt will benefit from skilled PT intervention to increase independence and safety with basic mobility in preparation for discharge to the venue listed below.       Follow Up Recommendations Home health PT    Equipment Recommendations  None recommended by PT    Recommendations for Other Services       Precautions / Restrictions Precautions Precautions: Fall Required Braces or Orthoses: Knee Immobilizer - Right Knee Immobilizer - Right: On when out of bed or walking Restrictions Weight Bearing Restrictions: No      Mobility  Bed Mobility Overal bed mobility: Modified Independent                Transfers Overall transfer level: Needs assistance Equipment used: Rolling walker (2 wheeled);None Transfers: Sit to/from Stand Sit to Stand: Supervision;Min guard         General transfer comment: heavily impaired from anxiolytics, impulsive, poor safety awarness.  Ambulation/Gait Ambulation/Gait assistance: Min guard;Min assist Gait Distance (Feet): 120 Feet Assistive device: Rolling walker (2 wheeled) Gait Pattern/deviations: Ataxic;Antalgic     General Gait Details: distracted, impulsive, needs navigation assist back to room, instability with turns. Unable to assist with KI rior to impulsively moving to EOB.  Stairs            Wheelchair Mobility    Modified Rankin (Stroke Patients Only)       Balance Overall balance assessment: History of Falls;Needs assistance         Standing balance support: Bilateral upper extremity supported;During functional activity Standing balance-Leahy Scale: Poor Standing balance comment: naerly falls into floor with transfer to [facility]  Pertinent Vitals/Pain Pain Assessment: No/denies pain    Home Living Family/patient expects to be discharged to:: Private residence Living Arrangements: Spouse/significant  other;Children Available Help at Discharge: Family;Available 24 hours/day Type of Home: House Home Access: Stairs to enter Entrance Stairs-Rails: Psychiatric nurse of Steps: 4 Home Layout: One level Home Equipment: Walker - 2 wheels;Cane - single point;Wheelchair - manual;Shower seat      Prior Function Level of Independence: Needs assistance   Gait / Transfers Assistance Needed: household AMB distances for past year; SPC for most AMB, fallls weekly due to bad knee OA  ADL's / Homemaking Assistance Needed: Pt. reports independence with ADLs.  Comments: Ambulation with cane as baseline     Hand Dominance   Dominant Hand: Right    Extremity/Trunk Assessment                Communication   Communication: No difficulties  Cognition Arousal/Alertness: Suspect due to medications Behavior During Therapy: Impulsive;Anxious;Restless Overall Cognitive Status: Impaired/Different from baseline                                 General Comments: given haldol and ativan in AM due GAD      General Comments      Exercises     Assessment/Plan    PT Assessment Patient needs continued PT services  PT Problem List Decreased strength;Decreased range of motion;Decreased activity tolerance;Decreased balance;Decreased mobility;Decreased safety awareness;Decreased knowledge of precautions       PT Treatment Interventions DME instruction;Balance training;Gait training;Stair training;Functional mobility training;Therapeutic activities;Therapeutic exercise;Patient/family education    PT Goals (Current goals can be found in the Care Plan section)  Acute Rehab PT Goals PT Goal Formulation: Patient unable to participate in goal setting Time For Goal Achievement: 09/08/19    Frequency Min 2X/week   Barriers to discharge        Co-evaluation               AM-PAC PT "6 Clicks" Mobility  Outcome Measure Help needed turning from your back to your  side while in a flat bed without using bedrails?: None Help needed moving from lying on your back to sitting on the side of a flat bed without using bedrails?: A Little Help needed moving to and from a bed to a chair (including a wheelchair)?: A Little Help needed standing up from a chair using your arms (e.g., wheelchair or bedside chair)?: A Little Help needed to walk in hospital room?: A Little Help needed climbing 3-5 steps with a railing? : A Lot 6 Click Score: 18    End of Session Equipment Utilized During Treatment: Gait belt Activity Tolerance: Patient tolerated treatment well;No increased pain Patient left: in bed;with bed alarm set;with call bell/phone within reach Nurse Communication: Mobility status PT Visit Diagnosis: Unsteadiness on feet (R26.81);Other abnormalities of gait and mobility (R26.89);Repeated falls (R29.6);History of falling (Z91.81);Difficulty in walking, not elsewhere classified (R26.2)    Time: 1400-1420 PT Time Calculation (min) (ACUTE ONLY): 20 min   Charges:   PT Evaluation $PT Eval High Complexity: 1 High          3:01 PM, 08/25/19 Etta Grandchild, PT, DPT Physical Therapist - [facility]  [phone] ([place])   Ladarrion Telfair C 08/25/2019, 2:54 PM

## 2019-08-26 ENCOUNTER — Inpatient Hospital Stay: Payer: Medicaid Other

## 2019-08-26 DIAGNOSIS — F419 Anxiety disorder, unspecified: Secondary | ICD-10-CM

## 2019-08-26 DIAGNOSIS — D709 Neutropenia, unspecified: Secondary | ICD-10-CM

## 2019-08-26 DIAGNOSIS — R5081 Fever presenting with conditions classified elsewhere: Secondary | ICD-10-CM

## 2019-08-26 LAB — COMPREHENSIVE METABOLIC PANEL
ALT: 19 U/L (ref 0–44)
AST: 37 U/L (ref 15–41)
Albumin: 2.8 g/dL — ABNORMAL LOW (ref 3.5–5.0)
Alkaline Phosphatase: 66 U/L (ref 38–126)
Anion gap: 7 (ref 5–15)
BUN: 6 mg/dL (ref 6–20)
CO2: 23 mmol/L (ref 22–32)
Calcium: 8.2 mg/dL — ABNORMAL LOW (ref 8.9–10.3)
Chloride: 105 mmol/L (ref 98–111)
Creatinine, Ser: 0.44 mg/dL (ref 0.44–1.00)
GFR calc Af Amer: >60 mL/min (ref >60)
GFR calc non Af Amer: >60 mL/min (ref >60)
Glucose, Bld: 107 mg/dL — ABNORMAL HIGH (ref 70–99)
Potassium: 3.5 mmol/L (ref 3.5–5.1)
Sodium: 135 mmol/L (ref 135–145)
Total Bilirubin: 1.6 mg/dL — ABNORMAL HIGH (ref 0.3–1.2)
Total Protein: 6.3 g/dL — ABNORMAL LOW (ref 6.5–8.1)

## 2019-08-26 LAB — CBC WITH DIFFERENTIAL/PLATELET
Abs Immature Granulocytes: 0.01 10*3/uL (ref 0.00–0.07)
Basophils Absolute: 0 10*3/uL (ref 0.0–0.1)
Basophils Relative: 1 %
Eosinophils Absolute: 0 10*3/uL (ref 0.0–0.5)
Eosinophils Relative: 0 %
HCT: 31.5 % — ABNORMAL LOW (ref 36.0–46.0)
Hemoglobin: 10.4 g/dL — ABNORMAL LOW (ref 12.0–15.0)
Immature Granulocytes: 1 %
Lymphocytes Relative: 13 %
Lymphs Abs: 0.2 10*3/uL — ABNORMAL LOW (ref 0.7–4.0)
MCH: 28.3 pg (ref 26.0–34.0)
MCHC: 33 g/dL (ref 30.0–36.0)
MCV: 85.8 fL (ref 80.0–100.0)
Monocytes Absolute: 0.2 10*3/uL (ref 0.1–1.0)
Monocytes Relative: 11 %
Neutro Abs: 1.2 10*3/uL — ABNORMAL LOW (ref 1.7–7.7)
Neutrophils Relative %: 74 %
Platelets: 53 10*3/uL — ABNORMAL LOW (ref 150–400)
RBC: 3.67 MIL/uL — ABNORMAL LOW (ref 3.87–5.11)
RDW: 26 % — ABNORMAL HIGH (ref 11.5–15.5)
Smear Review: DECREASED
WBC: 1.7 10*3/uL — ABNORMAL LOW (ref 4.0–10.5)
nRBC: 0 % (ref 0.0–0.2)

## 2019-08-26 LAB — CBC
HCT: 30.6 % — ABNORMAL LOW (ref 36.0–46.0)
Hemoglobin: 10.2 g/dL — ABNORMAL LOW (ref 12.0–15.0)
MCH: 28.7 pg (ref 26.0–34.0)
MCHC: 33.3 g/dL (ref 30.0–36.0)
MCV: 86.2 fL (ref 80.0–100.0)
Platelets: 52 10*3/uL — ABNORMAL LOW (ref 150–400)
RBC: 3.55 MIL/uL — ABNORMAL LOW (ref 3.87–5.11)
RDW: 26.2 % — ABNORMAL HIGH (ref 11.5–15.5)
WBC: 1.8 10*3/uL — ABNORMAL LOW (ref 4.0–10.5)
nRBC: 0 % (ref 0.0–0.2)

## 2019-08-26 LAB — URINALYSIS, COMPLETE (UACMP) WITH MICROSCOPIC
Bilirubin Urine: NEGATIVE
Glucose, UA: NEGATIVE mg/dL
Ketones, ur: NEGATIVE mg/dL
Nitrite: NEGATIVE
Protein, ur: NEGATIVE mg/dL
Specific Gravity, Urine: 1.011 (ref 1.005–1.030)
pH: 6 (ref 5.0–8.0)

## 2019-08-26 LAB — PROCALCITONIN: Procalcitonin: <0.1 ng/mL

## 2019-08-26 LAB — LACTIC ACID, PLASMA
Lactic Acid, Venous: 1.1 mmol/L (ref 0.5–1.9)
Lactic Acid, Venous: 0.8 mmol/L (ref 0.5–1.9)

## 2019-08-26 LAB — GLUCOSE, CAPILLARY
Glucose-Capillary: 97 mg/dL (ref 70–99)
Glucose-Capillary: 86 mg/dL (ref 70–99)

## 2019-08-26 MED ORDER — RIFAXIMIN 550 MG PO TABS
550.0000 mg | ORAL_TABLET | Freq: Two times a day (BID) | ORAL | Status: DC
  Administered 2019-08-26 – 2019-09-01 (×12): 550 mg via ORAL
  Filled 2019-08-26 (×13): qty 1

## 2019-08-26 MED ORDER — OXYCODONE HCL 5 MG PO TABS
5.0000 mg | ORAL_TABLET | Freq: Four times a day (QID) | ORAL | Status: DC | PRN
  Administered 2019-08-26 – 2019-08-27 (×2): 5 mg via ORAL
  Filled 2019-08-26 (×2): qty 1

## 2019-08-26 MED ORDER — LACTATED RINGERS IV BOLUS
1000.0000 mL | Freq: Once | INTRAVENOUS | Status: AC
  Administered 2019-08-26: 1000 mL via INTRAVENOUS

## 2019-08-26 MED ORDER — SODIUM CHLORIDE 0.9 % IV SOLN
2.0000 g | Freq: Three times a day (TID) | INTRAVENOUS | Status: DC
  Administered 2019-08-26 – 2019-08-27 (×4): 2 g via INTRAVENOUS
  Filled 2019-08-26 (×6): qty 2

## 2019-08-26 MED ORDER — TBO-FILGRASTIM 300 MCG/0.5ML ~~LOC~~ SOSY
300.0000 ug | PREFILLED_SYRINGE | Freq: Once | SUBCUTANEOUS | Status: AC
  Administered 2019-08-26: 300 ug via SUBCUTANEOUS
  Filled 2019-08-26: qty 0.5

## 2019-08-26 MED ORDER — SODIUM CHLORIDE 0.9 % IV SOLN
INTRAVENOUS | Status: DC | PRN
  Administered 2019-08-26 – 2019-08-27 (×2): 30 mL via INTRAVENOUS
  Administered 2019-08-28: 50 mL via INTRAVENOUS
  Administered 2019-08-28: 30 mL via INTRAVENOUS
  Administered 2019-08-30 – 2019-08-31 (×2): 250 mL via INTRAVENOUS

## 2019-08-26 MED ORDER — IBUPROFEN 400 MG PO TABS
400.0000 mg | ORAL_TABLET | Freq: Four times a day (QID) | ORAL | Status: DC | PRN
  Administered 2019-08-26 – 2019-08-28 (×3): 400 mg via ORAL
  Filled 2019-08-26 (×5): qty 1

## 2019-08-26 MED ORDER — VANCOMYCIN HCL 1250 MG/250ML IV SOLN
1250.0000 mg | INTRAVENOUS | Status: DC
  Administered 2019-08-26 – 2019-08-27 (×2): 1250 mg via INTRAVENOUS
  Filled 2019-08-26 (×2): qty 250

## 2019-08-26 MED ORDER — VANCOMYCIN HCL 1250 MG/250ML IV SOLN
1250.0000 mg | Freq: Once | INTRAVENOUS | Status: DC
  Filled 2019-08-26: qty 250

## 2019-08-26 NOTE — Progress Notes (Signed)
Rhonda Martin has been tachycardic from the inception of shift. Also started with a low grade fever. Teped sponging given.Denied chest pain, nausea, vomiting, dizziness or lightheadedness. Ativan P.O given as per MAR. NP on call informed about situation, no treatment at this time. Will continue to monitor.

## 2019-08-26 NOTE — Progress Notes (Addendum)
[name], female   DOB: August 23, 1975, 44 y.o.   MRN: [id number] [facility] PROGRESS NOTE  Rhonda Martin [id number] DOB: [birth year] DOA: 08/22/2019 PCP: Volney American, PA-C  HPI/Subjective: Patient feeling miserable.  Having nausea, abdominal pain, cough and shortness of breath.  Still very anxious.  Also sleeping a lot.  Objective: Vitals:   08/26/19 0611 08/26/19 1234  BP:  113/67  Pulse:  (!) 119  Resp:  (!) 24  Temp: (!) 100.4 F (38 C) 99.1 F (37.3 C)  SpO2:  94%    Intake/Output Summary (Last 24 hours) at 08/26/2019 1403 Last data filed at 08/26/2019 0616 Gross per 24 hour  Intake 118 ml  Output 1100 ml  Net -982 ml   Filed Weights   08/22/19 0720  Weight: 58 kg    ROS: Review of Systems  Unable to perform ROS: Acuity of condition  Respiratory: Positive for cough and shortness of breath.   Cardiovascular: Negative for chest pain.  Gastrointestinal: Positive for abdominal pain and nausea.  Psychiatric/Behavioral: Positive for hallucinations. The patient is nervous/anxious.    Exam: Physical Exam  Constitutional: She appears lethargic.  HENT:  Nose: No mucosal edema.  Mouth/Throat: No oropharyngeal exudate or posterior oropharyngeal edema.  Eyes: Conjunctivae and lids are normal.  Neck: Carotid bruit is not present.  Cardiovascular: S1 normal and S2 normal. Exam reveals no gallop.  No murmur heard. Respiratory: No respiratory distress. She has decreased breath sounds in the right lower field and the left lower field. She has no wheezes. She has rhonchi in the right lower field and the left lower field. She has no rales.  GI: Soft. Bowel sounds are normal. There is abdominal tenderness.  Musculoskeletal:     Right knee: Swelling present. Decreased range of motion.     Right ankle: No swelling.     Left ankle: No swelling.  Lymphadenopathy:    She has no cervical adenopathy.  Neurological: She appears lethargic.  Skin: Skin is  warm. No rash noted. Nails show no clubbing.  Psychiatric: Her mood appears anxious.      Data Reviewed: Basic Metabolic Panel: Recent Labs  Lab 08/22/19 0705 08/23/19 0450 08/26/19 0538  NA 135 142 135  K 4.6 4.0 3.5  CL 105 113* 105  CO2 19* 25 23  GLUCOSE 135* 107* 107*  BUN 6 8 6   CREATININE <0.30* 0.39* 0.44  CALCIUM 8.3* 8.0* 8.2*   Liver Function Tests: Recent Labs  Lab 08/22/19 0705 08/26/19 0538  AST 86* 37  ALT 29 19  ALKPHOS 92 66  BILITOT 2.9* 1.6*  PROT 7.4 6.3*  ALBUMIN 3.3* 2.8*   Recent Labs  Lab 08/22/19 0753  LIPASE 27   Recent Labs  Lab 08/22/19 1455 08/24/19 0847 08/25/19 0525  AMMONIA 55* 37* 64*   CBC: Recent Labs  Lab 08/22/19 0705 08/23/19 0450 08/24/19 0644 08/25/19 0525 08/26/19 0538  WBC 5.4 2.7* 2.5* 2.6* 1.7*  NEUTROABS  --   --  1.1*  --  1.2*  HGB 12.1 11.4* 10.3* 10.0* 10.4*  HCT 38.2 34.8* 31.0* 29.4* 31.5*  MCV 85.3 84.1 83.8 83.8 85.8  PLT 55* 50* 59* 63* 53*    CBG: Recent Labs  Lab 08/24/19 0745 08/25/19 0754 08/25/19 1144 08/26/19 0823 08/26/19 1154  GLUCAP 92 108* 126* 97 86    Recent Results (from the past 240 hour(s))  SARS CORONAVIRUS 2 (TAT 6-24 HRS) Nasopharyngeal Nasopharyngeal Swab     Status:  None   Collection Time: 08/18/19 11:51 AM   Specimen: Nasopharyngeal Swab  Result Value Ref Range Status   SARS Coronavirus 2 NEGATIVE NEGATIVE Final    Comment: (NOTE) SARS-CoV-2 target nucleic acids are NOT DETECTED. The SARS-CoV-2 RNA is generally detectable in upper and lower respiratory specimens during the acute phase of infection. Negative results do not preclude SARS-CoV-2 infection, do not rule out co-infections with other pathogens, and should not be used as the sole basis for treatment or other patient management decisions. Negative results must be combined with clinical observations, patient history, and epidemiological information. The expected result is Negative. Fact Sheet for  Patients: SugarRoll.be Fact Sheet for Healthcare Providers: https://www.woods-mathews.com/ This test is not yet approved or cleared by the [place] FDA and  has been authorized for detection and/or diagnosis of SARS-CoV-2 by FDA under an Emergency Use Authorization (EUA). This EUA will remain  in effect (meaning this test can be used) for the duration of the COVID-19 declaration under Section 56 4(b)(1) of the Act, 21 U.S.C. section 360bbb-3(b)(1), unless the authorization is terminated or revoked sooner. Performed at [facility], [place] [address], [place], [place] [address]   Blood Culture (routine x 2)     Status: None (Preliminary result)   Collection Time: 08/22/19  7:50 AM   Specimen: BLOOD  Result Value Ref Range Status   Specimen Description BLOOD LEFT ANTECUBITAL  Final   Special Requests   Final    BOTTLES DRAWN AEROBIC AND ANAEROBIC Blood Culture adequate volume   Culture   Final    NO GROWTH 4 DAYS Performed at [facility], [address], [place], [place] [address]    Report Status PENDING  Incomplete  Urine culture     Status: None   Collection Time: 08/22/19  7:50 AM   Specimen: In/Out Cath Urine  Result Value Ref Range Status   Specimen Description   Final    IN/OUT CATH URINE Performed at [facility], [address], [place], [place] [address]    Special Requests   Final    NONE Performed at [facility], [address] [place], [place] [address]    Culture   Final    NO GROWTH Performed at [facility], [place] [address] [place], [place] [address]    Report Status 08/23/2019 FINAL  Final  Blood Culture (routine x 2)     Status: None (Preliminary result)   Collection Time: 08/22/19  7:53 AM   Specimen: BLOOD  Result Value Ref Range Status   Specimen Description BLOOD LEFT HAND  Final   Special Requests   Final    BOTTLES DRAWN AEROBIC AND ANAEROBIC Blood Culture  adequate volume   Culture   Final    NO GROWTH 4 DAYS Performed at [facility], [address], [place],  [address]    Report Status PENDING  Incomplete  Respiratory Panel by RT PCR (Flu A&B, Covid) - Nasopharyngeal Swab     Status: None   Collection Time: 08/22/19 11:11 AM   Specimen: Nasopharyngeal Swab  Result Value Ref Range Status   SARS Coronavirus 2 by RT PCR NEGATIVE NEGATIVE Final    Comment: (NOTE) SARS-CoV-2 target nucleic acids are NOT DETECTED. The SARS-CoV-2 RNA is generally detectable in upper respiratoy specimens during the acute phase of infection. The lowest concentration of SARS-CoV-2 viral copies this assay can detect is 131 copies/mL. A negative result does not preclude SARS-Cov-2 infection and  should not be used as the sole basis for treatment or other patient management decisions. A negative result may occur with  improper specimen collection/handling, submission of specimen other than nasopharyngeal swab, presence of viral mutation(s) within the areas targeted by this assay, and inadequate number of viral copies (<131 copies/mL). A negative result must be combined with clinical observations, patient history, and epidemiological information. The expected result is Negative. Fact Sheet for Patients:  PinkCheek.be Fact Sheet for Healthcare Providers:  GravelBags.it This test is not yet ap proved or cleared by the [place] FDA and  has been authorized for detection and/or diagnosis of SARS-CoV-2 by FDA under an Emergency Use Authorization (EUA). This EUA will remain  in effect (meaning this test can be used) for the duration of the COVID-19 declaration under Section 564(b)(1) of the Act, 21 U.S.C. section 360bbb-3(b)(1), unless the authorization is terminated or revoked sooner.    Influenza A by PCR NEGATIVE NEGATIVE Final   Influenza B by PCR NEGATIVE NEGATIVE Final    Comment:  (NOTE) The Xpert Xpress SARS-CoV-2/FLU/RSV assay is intended as an aid in  the diagnosis of influenza from Nasopharyngeal swab specimens and  should not be used as a sole basis for treatment. Nasal washings and  aspirates are unacceptable for Xpert Xpress SARS-CoV-2/FLU/RSV  testing. Fact Sheet for Patients: PinkCheek.be Fact Sheet for Healthcare Providers: GravelBags.it This test is not yet approved or cleared by the [place] FDA and  has been authorized for detection and/or diagnosis of SARS-CoV-2 by  FDA under an Emergency Use Authorization (EUA). This EUA will remain  in effect (meaning this test can be used) for the duration of the  Covid-19 declaration under Section 564(b)(1) of the Act, 21  U.S.C. section 360bbb-3(b)(1), unless the authorization is  terminated or revoked. Performed at [facility], [address] [place], [place] [address]   CULTURE, BLOOD (ROUTINE X 2) w Reflex to ID Panel     Status: None (Preliminary result)   Collection Time: 08/26/19  5:38 AM   Specimen: BLOOD  Result Value Ref Range Status   Specimen Description BLOOD LEFT FOREARM  Final   Special Requests   Final    BOTTLES DRAWN AEROBIC AND ANAEROBIC Blood Culture results may not be optimal due to an excessive volume of blood received in culture bottles   Culture   Final    NO GROWTH < 12 HOURS Performed at [facility], [address], [place], [place] [address]    Report Status PENDING  Incomplete  CULTURE, BLOOD (ROUTINE X 2) w Reflex to ID Panel     Status: None (Preliminary result)   Collection Time: 08/26/19  5:38 AM   Specimen: BLOOD  Result Value Ref Range Status   Specimen Description BLOOD RIGHT MIDLINE  Final   Special Requests   Final    BOTTLES DRAWN AEROBIC AND ANAEROBIC Blood Culture adequate volume   Culture   Final    NO GROWTH < 12 HOURS Performed at [facility], [address]  [place], [place] [address]    Report Status PENDING  Incomplete     Scheduled Meds: . ferrous sulfate  325 mg Oral BID  . folic acid  1 mg Oral Daily  . lactulose  20 g Oral BID  . multivitamin with minerals  1 tablet Oral Daily  . nicotine  21 mg Transdermal Daily  . pantoprazole  40 mg Oral Daily  . QUEtiapine  25 mg Oral QHS  .  sodium chloride flush  10-40 mL Intracatheter Q12H  . sucralfate  1 g Oral TID WC & HS  . Tbo-Filgrastim  300 mcg Subcutaneous Once   Continuous Infusions: . sodium chloride 30 mL (08/26/19 0915)  . ceFEPime (MAXIPIME) IV 2 g (08/26/19 0918)  . thiamine injection 500 mg (08/26/19 0459)  . vancomycin 1,250 mg (08/26/19 1006)    Assessment/Plan:  1. Neutropenic fever despite being on antibiotics already.  Get more aggressive with antibiotics vancomycin and cefepime.  Follow-up cultures ordered last night.  Urine analysis looks negative.  Repeat chest x-ray looked negative even though pneumonia previously on CT scan.  Will hold on Haldol at this time.  We will give Neupogen or equivalent to elevate white blood cell count 2. Hallucinations here in the hospital.  Likely combination of pain meds and drinking alcohol.  Continue high-dose thiamine just in case this is Wernicke's encephalopathy. Continue Seroquel at night to get the patient sleeping.  Psychiatric consultation appreciated possibility of hepatic encephalopathy.  Patient refused lactulose this morning.  We will try Xifaxan. 3. Liver cirrhosis with thrombocytopenia.  No schistocytes seen on peripheral smear.  Platelet count dropped down a little bit 4. Right knee and ankle pain, worse after a fall.  Right knee showing soft tissue swelling concerning for hematoma or contusion.  Appreciate orthopedic consultation.  Advised physical therapy with knee brace.  Need to see the patient walk prior to disposition.  Pain medication every 6 hours as needed only. 5. Alcohol abuse.  Psychiatrist wrote Ativan as  needed 6. Iron deficiency anemia.  On oral iron. 7. Acute hypoxic respiratory failure.  Patient tapered off oxygen.  8. Relative hypotension.  Holding cardiac meds at this time.  Discontinue IV fluids. 9. Anxiety and insomnia.  Trial of Seroquel at night.  Appreciate psychiatric consultation  Code Status:     Code Status Orders  (From admission, onward)         Start     Ordered   08/22/19 1456  Full code  Continuous     08/22/19 1457        Code Status History    Date Active Date Inactive Code Status Order ID Comments User Context   07/10/2019 1934 07/17/2019 1851 Full Code [id number]  Ivor Costa, MD ED   02/06/2019 1815 02/08/2019 1805 Full Code [id number]  Vaughan Basta, MD Inpatient   01/20/2019 1321 01/27/2019 1710 Full Code [id number]  Lang Snow, NP ED   Advance Care Planning Activity     Family Communication: Spoke with husband Nathaneil Canary on the phone [phone]. Disposition Plan: With high fever last night need to watch here in the hospital further.  Hold on Haldol at this point and get more aggressive with antibiotics.  Follow-up cultures.  Likely will need a few more days of IV antibiotics.  Consultants:  Orthopedic surgery  Psychiatry  Antibiotics:  Cefepime  Vancomycin  Time spent: 27 minutes  [place]

## 2019-08-26 NOTE — Progress Notes (Signed)
    BRIEF OVERNIGHT PROGRESS REPORT   SUBJECTIVE: Patient spiked a fever of 103.6, also noted to be tachycardic and restless. Per primary RN, no associated symptoms of sob, n/v, rigidity/stiff muscles, or diaphoresis.  OBJECTIVE: afebrile  Temp 103.6 with blood pressure 119/69 mm Hg and pulse rate 140 beats/min. There were no focal neurological deficits; she was alert &oriented x 2 but restless.  ASSESSMENT:43 y.o. female with medical history significant of alcohol abuse, hepatitis C, liver cirrhosis, hypertension, COPD, GERD, anxiety, iron deficiency anemia, cervical cancer, tobacco abuse, thrombocytopenia admitted with hallucination and confusion.  PLAN: 1. Sepsis - Patient meets SIRS criteria,Heart Rate 140 beats/minute, wbc,Temperature 103. 2.5 - Unclear if this is true sepsis or Haldol induced (fever,restlessness, tachycardia?) However given her thrombocytopenia and leukopenia will work up for sepsis. - Chest x-ray  - Covid negative - Repeat CBC, CMP, Procalcitonin, lactic acid - UA + Urine cultures  - Blood cultures  - Continue current abx pending work up above - IVFs and PRN bolus to keep MAP<44mmHg or SBP <67mmHg     Rufina Falco, DNP, CCRN, [facility] Nurse Practitioner

## 2019-08-26 NOTE — Consult Note (Signed)
Pharmacy Antibiotic Note  Rhonda Martin is a 44 y.o. female admitted on 08/22/2019 with sepsis/HCAP.    Pharmacy has been consulted for Cefepime/Vancomycin dosing.  Plan: Will start  Cefepime 2g q8h  Will start  Vancomycin 1250mg  mg IV Q 24 hrs.  Goal AUC 400-550. Expected AUC: 468 SCr used: 0.8 (actual 0.44)   Height: 5\' 2"  (157.5 cm) Weight: 127 lb 13.9 oz (58 kg) IBW/kg (Calculated) : 50.1  Temp (24hrs), Avg:100.6 F (38.1 C), Min:99.5 F (37.5 C), Max:103.6 F (39.8 C)  Recent Labs  Lab 08/22/19 0705 08/22/19 0750 08/23/19 0450 08/24/19 0644 08/25/19 0525 08/26/19 0527 08/26/19 0538  WBC 5.4  --  2.7* 2.5* 2.6*  --  1.7*  CREATININE <0.30*  --  0.39*  --   --   --  0.44  LATICACIDVEN  --  1.3  --   --   --  1.1  --     Estimated Creatinine Clearance: 71.7 mL/min (by C-G formula based on SCr of 0.44 mg/dL).    No Known Allergies  Antimicrobials this admission: Cefepime 3/16 x 1 Azithromycin 3/17 >> 3/19 Ceftriaxone 3/17 >> 3/19 Vancomycin 3/20 >> Cefepime 3/20 >>  Dose adjustments this admission: None  Microbiology results: 3/16 UCx NG final 3/16 BCx NG x 3 days 3/20 BCx: pending 3/16 COVID/FLU NEG  Thank you for allowing pharmacy to be a part of this patient's care.  Lu Duffel, PharmD, BCPS Clinical Pharmacist 08/26/2019 7:34 AM

## 2019-08-27 ENCOUNTER — Inpatient Hospital Stay
Admit: 2019-08-27 | Discharge: 2019-08-27 | Disposition: A | Discharge status: Home / Self Care | Payer: Medicaid Other | Attending: Internal Medicine | Admitting: Internal Medicine

## 2019-08-27 DIAGNOSIS — R6520 Severe sepsis without septic shock: Secondary | ICD-10-CM

## 2019-08-27 DIAGNOSIS — G9341 Metabolic encephalopathy: Secondary | ICD-10-CM

## 2019-08-27 DIAGNOSIS — B377 Candidal sepsis: Principal | ICD-10-CM

## 2019-08-27 DIAGNOSIS — K7460 Unspecified cirrhosis of liver: Secondary | ICD-10-CM

## 2019-08-27 LAB — AMMONIA: Ammonia: 55 umol/L — ABNORMAL HIGH (ref 9–35)

## 2019-08-27 LAB — PROCALCITONIN: Procalcitonin: 0.11 ng/mL

## 2019-08-27 LAB — GLUCOSE, CAPILLARY: Glucose-Capillary: 128 mg/dL — ABNORMAL HIGH (ref 70–99)

## 2019-08-27 LAB — BLOOD CULTURE ID PANEL (REFLEXED)
Acinetobacter baumannii: NOT DETECTED
Candida albicans: DETECTED — AB
Candida glabrata: DETECTED — AB
Candida krusei: NOT DETECTED
Candida parapsilosis: NOT DETECTED
Candida tropicalis: NOT DETECTED
Enterobacter cloacae complex: NOT DETECTED
Enterobacteriaceae species: NOT DETECTED
Enterococcus species: NOT DETECTED
Escherichia coli: NOT DETECTED
Haemophilus influenzae: NOT DETECTED
Klebsiella oxytoca: NOT DETECTED
Klebsiella pneumoniae: NOT DETECTED
Listeria monocytogenes: NOT DETECTED
Neisseria meningitidis: NOT DETECTED
Proteus species: NOT DETECTED
Pseudomonas aeruginosa: NOT DETECTED
Serratia marcescens: NOT DETECTED
Staphylococcus aureus (BCID): NOT DETECTED
Staphylococcus species: NOT DETECTED
Streptococcus agalactiae: NOT DETECTED
Streptococcus pneumoniae: NOT DETECTED
Streptococcus pyogenes: NOT DETECTED
Streptococcus species: NOT DETECTED

## 2019-08-27 LAB — BASIC METABOLIC PANEL
Anion gap: 8 (ref 5–15)
BUN: 11 mg/dL (ref 6–20)
CO2: 20 mmol/L — ABNORMAL LOW (ref 22–32)
Calcium: 8.1 mg/dL — ABNORMAL LOW (ref 8.9–10.3)
Chloride: 108 mmol/L (ref 98–111)
Creatinine, Ser: 0.46 mg/dL (ref 0.44–1.00)
GFR calc Af Amer: >60 mL/min (ref >60)
GFR calc non Af Amer: >60 mL/min (ref >60)
Glucose, Bld: 98 mg/dL (ref 70–99)
Potassium: 3.7 mmol/L (ref 3.5–5.1)
Sodium: 136 mmol/L (ref 135–145)

## 2019-08-27 LAB — ECHOCARDIOGRAM COMPLETE
Height: 62 in
Weight: 2045.87 oz

## 2019-08-27 LAB — CULTURE, BLOOD (ROUTINE X 2)
Culture: NO GROWTH
Culture: NO GROWTH
Special Requests: ADEQUATE
Special Requests: ADEQUATE

## 2019-08-27 MED ORDER — OXYCODONE HCL 5 MG PO TABS
10.0000 mg | ORAL_TABLET | Freq: Four times a day (QID) | ORAL | Status: DC | PRN
  Administered 2019-08-27 – 2019-09-01 (×18): 10 mg via ORAL
  Filled 2019-08-27 (×18): qty 2

## 2019-08-27 MED ORDER — THIAMINE HCL 100 MG/ML IJ SOLN
100.0000 mg | INTRAMUSCULAR | Status: DC
  Administered 2019-08-28: 100 mg via INTRAVENOUS
  Filled 2019-08-27: qty 2

## 2019-08-27 MED ORDER — SODIUM CHLORIDE 0.9 % IV SOLN
200.0000 mg | Freq: Once | INTRAVENOUS | Status: AC
  Administered 2019-08-27: 200 mg via INTRAVENOUS
  Filled 2019-08-27: qty 200

## 2019-08-27 MED ORDER — SODIUM CHLORIDE 0.9 % IV BOLUS
500.0000 mL | Freq: Once | INTRAVENOUS | Status: AC
  Administered 2019-08-27: 500 mL via INTRAVENOUS

## 2019-08-27 MED ORDER — SODIUM CHLORIDE 0.9 % IV SOLN
100.0000 mg | INTRAVENOUS | Status: DC
  Administered 2019-08-28 – 2019-09-01 (×5): 100 mg via INTRAVENOUS
  Filled 2019-08-27 (×6): qty 100

## 2019-08-27 NOTE — Progress Notes (Signed)
PHARMACY - PHYSICIAN COMMUNICATION CRITICAL VALUE ALERT - BLOOD CULTURE IDENTIFICATION (BCID)  Rhonda Martin is an 44 y.o. female who presented to [facility] on 08/22/2019 with a chief complaint of hallucination and confusion  Assessment:  Possible PNA with continued fevers  Name of physician (or Provider) Contacted: Dr Leslye Peer  Current antibiotics: vancomycin, cefepime  Changes to prescribed antibiotics recommended:  Eraxis 200 mg IV x1 then 100 mg IV q24h  Results for orders placed or performed during the hospital encounter of 08/22/19  Blood Culture ID Panel (Reflexed) (Collected: 08/26/2019  5:38 AM)  Result Value Ref Range   Enterococcus species NOT DETECTED NOT DETECTED   Listeria monocytogenes NOT DETECTED NOT DETECTED   Staphylococcus species NOT DETECTED NOT DETECTED   Staphylococcus aureus (BCID) NOT DETECTED NOT DETECTED   Streptococcus species NOT DETECTED NOT DETECTED   Streptococcus agalactiae NOT DETECTED NOT DETECTED   Streptococcus pneumoniae NOT DETECTED NOT DETECTED   Streptococcus pyogenes NOT DETECTED NOT DETECTED   Acinetobacter baumannii NOT DETECTED NOT DETECTED   Enterobacteriaceae species NOT DETECTED NOT DETECTED   Enterobacter cloacae complex NOT DETECTED NOT DETECTED   Escherichia coli NOT DETECTED NOT DETECTED   Klebsiella oxytoca NOT DETECTED NOT DETECTED   Klebsiella pneumoniae NOT DETECTED NOT DETECTED   Proteus species NOT DETECTED NOT DETECTED   Serratia marcescens NOT DETECTED NOT DETECTED   Haemophilus influenzae NOT DETECTED NOT DETECTED   Neisseria meningitidis NOT DETECTED NOT DETECTED   Pseudomonas aeruginosa NOT DETECTED NOT DETECTED   Candida albicans DETECTED (A) NOT DETECTED   Candida glabrata DETECTED (A) NOT DETECTED   Candida krusei NOT DETECTED NOT DETECTED   Candida parapsilosis NOT DETECTED NOT DETECTED   Candida tropicalis NOT DETECTED NOT DETECTED    [name] 08/27/2019  7:09 AM

## 2019-08-27 NOTE — Progress Notes (Signed)
Patient ID: WALIDA HEWEY, female   DOB: [birth year], 44 y.o.   MRN: [id number] [facility] PROGRESS NOTE  JIA LEOTTA [id number] DOB: 1976/04/04 DOA: 08/22/2019 PCP: Volney American, PA-C  HPI/Subjective: Patient more alert this morning.  States that she is in a lot of pain.  Her knee and ankle are hurting her.  She has abdominal pain.  She cannot get any relief.  Objective: Vitals:   08/27/19 0759 08/27/19 1200  BP: (!) 89/48 114/68  Pulse: (!) 101 (!) 101  Resp: 12   Temp: 98.8 F (37.1 C)   SpO2: 94%     Intake/Output Summary (Last 24 hours) at 08/27/2019 1410 Last data filed at 08/27/2019 0952 Gross per 24 hour  Intake 958.54 ml  Output 950 ml  Net 8.54 ml   Filed Weights   08/22/19 0720  Weight: 58 kg    ROS: Review of Systems  Constitutional: Positive for fever.  Eyes: Negative for blurred vision.  Respiratory: Positive for cough and shortness of breath.   Cardiovascular: Negative for chest pain.  Gastrointestinal: Positive for abdominal pain and nausea. Negative for vomiting.  Genitourinary: Negative for dysuria.  Musculoskeletal: Positive for joint pain.  Neurological: Negative for headaches.   Exam: Physical Exam  Constitutional: She is oriented to person, place, and time.  HENT:  Nose: No mucosal edema.  Mouth/Throat: No oropharyngeal exudate or posterior oropharyngeal edema.  Eyes: Conjunctivae and lids are normal.  Neck: Carotid bruit is not present.  Cardiovascular: S1 normal and S2 normal. Tachycardia present. Exam reveals no gallop.  No murmur heard. Pulses:      Dorsalis pedis pulses are 3+ on the left side.  Respiratory: No respiratory distress. She has decreased breath sounds in the right lower field and the left lower field. She has no wheezes. She has no rhonchi. She has no rales.  GI: Soft. Bowel sounds are normal. There is no abdominal tenderness.  Musculoskeletal:     Right ankle: No swelling.     Left ankle: No swelling.   Lymphadenopathy:    She has no cervical adenopathy.  Neurological: She is alert and oriented to person, place, and time. No cranial nerve deficit.  Skin: Skin is warm. No rash noted. Nails show no clubbing.  Psychiatric: Her mood appears anxious.      Data Reviewed: Basic Metabolic Panel: Recent Labs  Lab 08/22/19 0705 08/23/19 0450 08/26/19 0538 08/27/19 0640  NA 135 142 135 136  K 4.6 4.0 3.5 3.7  CL 105 113* 105 108  CO2 19* 25 23 20*  GLUCOSE 135* 107* 107* 98  BUN 6 8 6 11   CREATININE <0.30* 0.39* 0.44 0.46  CALCIUM 8.3* 8.0* 8.2* 8.1*   Liver Function Tests: Recent Labs  Lab 08/22/19 0705 08/26/19 0538  AST 86* 37  ALT 29 19  ALKPHOS 92 66  BILITOT 2.9* 1.6*  PROT 7.4 6.3*  ALBUMIN 3.3* 2.8*   Recent Labs  Lab 08/22/19 0753  LIPASE 27   Recent Labs  Lab 08/22/19 1455 08/24/19 0847 08/25/19 0525 08/27/19 0640  AMMONIA 55* 37* 64* 55*   CBC: Recent Labs  Lab 08/23/19 0450 08/24/19 0644 08/25/19 0525 08/26/19 0538 08/26/19 1524  WBC 2.7* 2.5* 2.6* 1.7* 1.8*  NEUTROABS  --  1.1*  --  1.2*  --   HGB 11.4* 10.3* 10.0* 10.4* 10.2*  HCT 34.8* 31.0* 29.4* 31.5* 30.6*  MCV 84.1 83.8 83.8 85.8 86.2  PLT 50* 59* 63* 53* 52*  CBG: Recent Labs  Lab 08/25/19 0754 08/25/19 1144 08/26/19 0823 08/26/19 1154 08/27/19 0741  GLUCAP 108* 126* 97 86 128*    Recent Results (from the past 240 hour(s))  SARS CORONAVIRUS 2 (TAT 6-24 HRS) Nasopharyngeal Nasopharyngeal Swab     Status: None   Collection Time: 08/18/19 11:51 AM   Specimen: Nasopharyngeal Swab  Result Value Ref Range Status   SARS Coronavirus 2 NEGATIVE NEGATIVE Final    Comment: (NOTE) SARS-CoV-2 target nucleic acids are NOT DETECTED. The SARS-CoV-2 RNA is generally detectable in upper and lower respiratory specimens during the acute phase of infection. Negative results do not preclude SARS-CoV-2 infection, do not rule out co-infections with other pathogens, and should not be used  as the sole basis for treatment or other patient management decisions. Negative results must be combined with clinical observations, patient history, and epidemiological information. The expected result is Negative. Fact Sheet for Patients: SugarRoll.be Fact Sheet for Healthcare Providers: https://www.woods-mathews.com/ This test is not yet approved or cleared by the [place] FDA and  has been authorized for detection and/or diagnosis of SARS-CoV-2 by FDA under an Emergency Use Authorization (EUA). This EUA will remain  in effect (meaning this test can be used) for the duration of the COVID-19 declaration under Section 56 4(b)(1) of the Act, 21 U.S.C. section 360bbb-3(b)(1), unless the authorization is terminated or revoked sooner. Performed at [facility], [place] [address] [place], [place] [address]   Blood Culture (routine x 2)     Status: None   Collection Time: 08/22/19  7:50 AM   Specimen: BLOOD  Result Value Ref Range Status   Specimen Description BLOOD LEFT ANTECUBITAL  Final   Special Requests   Final    BOTTLES DRAWN AEROBIC AND ANAEROBIC Blood Culture adequate volume   Culture   Final    NO GROWTH 5 DAYS Performed at [facility], [address], [place], [place] [address]    Report Status 08/27/2019 FINAL  Final  Urine culture     Status: None   Collection Time: 08/22/19  7:50 AM   Specimen: In/Out Cath Urine  Result Value Ref Range Status   Specimen Description   Final    IN/OUT CATH URINE Performed at [facility], [address], [place], [place] [address]    Special Requests   Final    NONE Performed at [facility], [address], [place], [place] [address]    Culture   Final    NO GROWTH Performed at [facility], [place] [address] [place], [place] [address]    Report Status 08/23/2019 FINAL  Final  Blood Culture (routine x 2)     Status: None   Collection Time:  08/22/19  7:53 AM   Specimen: BLOOD  Result Value Ref Range Status   Specimen Description BLOOD LEFT HAND  Final   Special Requests   Final    BOTTLES DRAWN AEROBIC AND ANAEROBIC Blood Culture adequate volume   Culture   Final    NO GROWTH 5 DAYS Performed at [facility], [address] [place], [place] [address]    Report Status 08/27/2019 FINAL  Final  Respiratory Panel by RT PCR (Flu A&B, Covid) - Nasopharyngeal Swab     Status: None   Collection Time: 08/22/19 11:11 AM   Specimen: Nasopharyngeal Swab  Result Value Ref Range Status   SARS Coronavirus 2 by RT PCR NEGATIVE NEGATIVE Final    Comment: (NOTE) SARS-CoV-2  target nucleic acids are NOT DETECTED. The SARS-CoV-2 RNA is generally detectable in upper respiratoy specimens during the acute phase of infection. The lowest concentration of SARS-CoV-2 viral copies this assay can detect is 131 copies/mL. A negative result does not preclude SARS-Cov-2 infection and should not be used as the sole basis for treatment or other patient management decisions. A negative result may occur with  improper specimen collection/handling, submission of specimen other than nasopharyngeal swab, presence of viral mutation(s) within the areas targeted by this assay, and inadequate number of viral copies (<131 copies/mL). A negative result must be combined with clinical observations, patient history, and epidemiological information. The expected result is Negative. Fact Sheet for Patients:  PinkCheek.be Fact Sheet for Healthcare Providers:  GravelBags.it This test is not yet ap proved or cleared by the [place] FDA and  has been authorized for detection and/or diagnosis of SARS-CoV-2 by FDA under an Emergency Use Authorization (EUA). This EUA will remain  in effect (meaning this test can be used) for the duration of the COVID-19 declaration under Section 564(b)(1) of the Act,  21 U.S.C. section 360bbb-3(b)(1), unless the authorization is terminated or revoked sooner.    Influenza A by PCR NEGATIVE NEGATIVE Final   Influenza B by PCR NEGATIVE NEGATIVE Final    Comment: (NOTE) The Xpert Xpress SARS-CoV-2/FLU/RSV assay is intended as an aid in  the diagnosis of influenza from Nasopharyngeal swab specimens and  should not be used as a sole basis for treatment. Nasal washings and  aspirates are unacceptable for Xpert Xpress SARS-CoV-2/FLU/RSV  testing. Fact Sheet for Patients: PinkCheek.be Fact Sheet for Healthcare Providers: GravelBags.it This test is not yet approved or cleared by the [place] FDA and  has been authorized for detection and/or diagnosis of SARS-CoV-2 by  FDA under an Emergency Use Authorization (EUA). This EUA will remain  in effect (meaning this test can be used) for the duration of the  Covid-19 declaration under Section 564(b)(1) of the Act, 21  U.S.C. section 360bbb-3(b)(1), unless the authorization is  terminated or revoked. Performed at [facility], [address] [place], [place] [address]   CULTURE, BLOOD (ROUTINE X 2) w Reflex to ID Panel     Status: None (Preliminary result)   Collection Time: 08/26/19  5:38 AM   Specimen: BLOOD  Result Value Ref Range Status   Specimen Description BLOOD LEFT FOREARM  Final   Special Requests   Final    BOTTLES DRAWN AEROBIC AND ANAEROBIC Blood Culture results may not be optimal due to an excessive volume of blood received in culture bottles   Culture   Final    NO GROWTH 1 DAY Performed at [facility], [address] [place], [place] [address]    Report Status PENDING  Incomplete  CULTURE, BLOOD (ROUTINE X 2) w Reflex to ID Panel     Status: None (Preliminary result)   Collection Time: 08/26/19  5:38 AM   Specimen: BLOOD  Result Value Ref Range Status   Specimen Description BLOOD RIGHT MIDLINE  Final    Special Requests   Final    BOTTLES DRAWN AEROBIC AND ANAEROBIC Blood Culture adequate volume   Culture  Setup Time   Final    Organism ID to follow YEAST IN BOTH AEROBIC AND ANAEROBIC BOTTLES CRITICAL RESULT CALLED TO, READ BACK BY AND VERIFIED WITH: MICHAEL SIMPSON AT [id number] 08/27/19.PMF Performed at [facility], [address], [place], [place] [address]    Culture YEAST  Final   Report Status PENDING  Incomplete  Blood Culture ID Panel (Reflexed)     Status: Abnormal   Collection Time: 08/26/19  5:38 AM  Result Value Ref Range Status   Enterococcus species NOT DETECTED NOT DETECTED Final   Listeria monocytogenes NOT DETECTED NOT DETECTED Final   Staphylococcus species NOT DETECTED NOT DETECTED Final   Staphylococcus aureus (BCID) NOT DETECTED NOT DETECTED Final   Streptococcus species NOT DETECTED NOT DETECTED Final   Streptococcus agalactiae NOT DETECTED NOT DETECTED Final   Streptococcus pneumoniae NOT DETECTED NOT DETECTED Final   Streptococcus pyogenes NOT DETECTED NOT DETECTED Final   Acinetobacter baumannii NOT DETECTED NOT DETECTED Final   Enterobacteriaceae species NOT DETECTED NOT DETECTED Final   Enterobacter cloacae complex NOT DETECTED NOT DETECTED Final   Escherichia coli NOT DETECTED NOT DETECTED Final   Klebsiella oxytoca NOT DETECTED NOT DETECTED Final   Klebsiella pneumoniae NOT DETECTED NOT DETECTED Final   Proteus species NOT DETECTED NOT DETECTED Final   Serratia marcescens NOT DETECTED NOT DETECTED Final   Haemophilus influenzae NOT DETECTED NOT DETECTED Final   Neisseria meningitidis NOT DETECTED NOT DETECTED Final   Pseudomonas aeruginosa NOT DETECTED NOT DETECTED Final   Candida albicans DETECTED (A) NOT DETECTED Final    Comment: CRITICAL RESULT CALLED TO, READ BACK BY AND VERIFIED WITH: MICHAEL SIMPSON AT [id number] 08/27/19.PMF    Candida glabrata DETECTED (A) NOT DETECTED Final    Comment: CRITICAL RESULT CALLED TO, READ BACK BY AND VERIFIED  WITH: MICHAEL SIMPSON AT [id number] 08/27/19.PMF    Candida krusei NOT DETECTED NOT DETECTED Final   Candida parapsilosis NOT DETECTED NOT DETECTED Final   Candida tropicalis NOT DETECTED NOT DETECTED Final    Comment: Performed at [facility], [address] [place], [place] [address]     Studies: DG Chest 1 View  Result Date: 08/26/2019 CLINICAL DATA:  Sepsis EXAM: CHEST  1 VIEW COMPARISON:  08/22/2019 FINDINGS: The heart size and mediastinal contours are within normal limits. Both lungs are clear. The visualized skeletal structures are unremarkable. IMPRESSION: No active disease. Electronically Signed   By: Ulyses Jarred M.D.   On: 08/26/2019 05:46    Scheduled Meds: . ferrous sulfate  325 mg Oral BID  . folic acid  1 mg Oral Daily  . lactulose  20 g Oral BID  . multivitamin with minerals  1 tablet Oral Daily  . nicotine  21 mg Transdermal Daily  . pantoprazole  40 mg Oral Daily  . QUEtiapine  25 mg Oral QHS  . rifaximin  550 mg Oral BID  . sodium chloride flush  10-40 mL Intracatheter Q12H  . sucralfate  1 g Oral TID WC & HS  . [START ON 08/28/2019] thiamine injection  100 mg Intravenous Q24H   Continuous Infusions: . sodium chloride 30 mL (08/27/19 0952)  . [START ON 08/28/2019] anidulafungin      Assessment/Plan:  1. Sepsis with Candida, with acute encephalopathy.  Eraxis started this morning after positive blood culture report.  We will get infectious disease consultation tomorrow.  Patient had a midline placed on the 18th.  Spoke with nursing staff to try to get another IV in prior to removing midline.  Echocardiogram ordered.  Of note, initial blood cultures on the 16th were negative. 2. Neutropenic fever despite being on antibiotics during the hospital course.  Given a dose of Granix yesterday.  Watch white blood cell count.  I stopped other antibiotics since Candida is growing in  the blood culture. 3. Hallucinations here in the hospital.  Wondering if this is  secondary to Candida bacteremia rather than other things that I have been treating.  Change high-dose IV thiamine over to daily thiamine.  Continue Xifaxan for possible hepatic encephalopathy 4. Liver cirrhosis with thrombocytopenia.  No schistocytes seen on peripheral smear.  Continue to monitor platelet count. 5. Right knee and ankle pain.  Appreciate orthopedic consultation.  Physical therapy to work with patient with knee brace.  Patient taking pain medications every 6 hours 6. Alcohol abuse.  As needed Ativan 7. Iron deficiency anemia on oral iron 8. Acute hypoxic respiratory failure.  Patient tapered off oxygen 9. Relative hypotension.  I did give a fluid bolus this morning. 10. Anxiety and insomnia.  Patient slept last night with Seroquel.  Code Status:     Code Status Orders  (From admission, onward)         Start     Ordered   08/22/19 1456  Full code  Continuous     08/22/19 1457        Code Status History    Date Active Date Inactive Code Status Order ID Comments User Context   07/10/2019 1934 07/17/2019 1851 Full Code [id number]  Ivor Costa, MD ED   02/06/2019 1815 02/08/2019 1805 Full Code [id number]  Vaughan Basta, MD Inpatient   01/20/2019 1321 01/27/2019 1710 Full Code [id number]  Lang Snow, NP ED   Advance Care Planning Activity     Family Communication: Spoke with husband Nathaneil Canary on the phone at [phone] Disposition Plan: With Candida bacteremia, will need infectious disease consultation and treatment for a few days in the hospital with IV Eraxis.  Patient may end up needing a TEE to determine length of therapy.   Antibiotics:  Eraxis  Time spent: 28 minutes  [place]

## 2019-08-27 NOTE — Consult Note (Signed)
    [place] for Infectious Disease   Reason for visit: alert for Candidemia in blood; CHAMP autoalert  Interval History: This is a 44 yo with liver cirrhosis and now Candida in blood.   Eraxis started. Has a midline. I recommend: - removal of the line  - TTE  - ID consult tomorrow.  Thayer Headings, MD

## 2019-08-28 LAB — GLUCOSE, CAPILLARY: Glucose-Capillary: 85 mg/dL (ref 70–99)

## 2019-08-28 LAB — PROCALCITONIN: Procalcitonin: <0.1 ng/mL

## 2019-08-28 MED ORDER — MOMETASONE FURO-FORMOTEROL FUM 100-5 MCG/ACT IN AERO
2.0000 | INHALATION_SPRAY | Freq: Two times a day (BID) | RESPIRATORY_TRACT | Status: DC
  Administered 2019-08-28 – 2019-09-01 (×8): 2 via RESPIRATORY_TRACT
  Filled 2019-08-28 (×2): qty 8.8

## 2019-08-28 MED ORDER — THIAMINE HCL 100 MG PO TABS
100.0000 mg | ORAL_TABLET | Freq: Every day | ORAL | Status: DC
  Administered 2019-08-29 – 2019-09-01 (×4): 100 mg via ORAL
  Filled 2019-08-28 (×4): qty 1

## 2019-08-28 MED ORDER — VITAMIN K1 10 MG/ML IJ SOLN
5.0000 mg | Freq: Once | INTRAVENOUS | Status: AC
  Administered 2019-08-28: 5 mg via INTRAVENOUS
  Filled 2019-08-28: qty 0.5

## 2019-08-28 NOTE — Progress Notes (Signed)
Physical Therapy Treatment Patient Details Name: Rhonda Martin MRN: [id number] DOB: 1976/01/21 Today's Date: 08/28/2019    History of Present Illness Rhonda Martin is a 20yoF who [facility] c severe pain in knee and ankle. Pt has Charcot knee and is s/p ankle fushion. Pt also having hallucinations upon arrival. Noted knee swelling, orthopedics following. PMH: Liver cirrhosis, ETOH abuse, Charcot knee, s/p Ankle fusion, hypertension, COPD, GERD, anxiety, iron deficiency anemia, cervical cancer, tobacco abuse, thrombocytopenia    PT Comments    Pt c/o increased knee pain today limiting activity and participation in therapy.  Unable to find KI in room but pt declines mobility.  She does do some self-directed exercises in bed with cues to increase reps and to stay on task.  Pt talking about "puppies everywhere" but is able to tell me where she is.  Stated puppies are in "the other room."  She slurs speech at times and at others it is clear and conversation is appropriate.  Pt declined further mobility and activity at this time.   Follow Up Recommendations  Home health PT;Supervision for mobility/OOB     Equipment Recommendations  None recommended by PT    Recommendations for Other Services       Precautions / Restrictions Precautions Precautions: Fall Required Braces or Orthoses: Knee Immobilizer - Right Knee Immobilizer - Right: On when out of bed or walking Restrictions Weight Bearing Restrictions: No    Mobility  Bed Mobility               General bed mobility comments: refused  Transfers                 General transfer comment: refused  Ambulation/Gait                 Stairs             Wheelchair Mobility    Modified Rankin (Stroke Patients Only)       Balance                                            Cognition Arousal/Alertness: Suspect due to medications Behavior During Therapy:  Impulsive;Anxious;Restless Overall Cognitive Status: Impaired/Different from baseline                                        Exercises Other Exercises Other Exercises: self directed supine LE AROM - heel slides, slr and ab/add x 10    General Comments        Pertinent Vitals/Pain Pain Assessment: 0-10 Pain Score: 8  Pain Location: Knee - L Pain Intervention(s): Limited activity within patient's tolerance;Monitored during session    Home Living                      Prior Function            PT Goals (current goals can now be found in the care plan section) Progress towards PT goals: Progressing toward goals    Frequency    Min 2X/week      PT Plan Current plan remains appropriate    Co-evaluation              AM-PAC PT "6 Clicks" Mobility   Outcome Measure  Help needed turning from your back to your side while in a flat bed without using bedrails?: None Help needed moving from lying on your back to sitting on the side of a flat bed without using bedrails?: A Little Help needed moving to and from a bed to a chair (including a wheelchair)?: A Little Help needed standing up from a chair using your arms (e.g., wheelchair or bedside chair)?: A Little Help needed to walk in hospital room?: A Little Help needed climbing 3-5 steps with a railing? : A Lot 6 Click Score: 18    End of Session Equipment Utilized During Treatment: Gait belt Activity Tolerance: Patient limited by pain;Other (comment) Patient left: in bed;with bed alarm set;with call bell/phone within reach Nurse Communication: Mobility status       Time: 1421-1436 PT Time Calculation (min) (ACUTE ONLY): 15 min  Charges:  $Therapeutic Exercise: 8-22 mins                     [name], PTA 08/28/19, 2:57 PM

## 2019-08-28 NOTE — TOC Initial Note (Signed)
Transition of Care [facility]) - Initial/Assessment Note    Patient Details  Name: Rhonda Martin MRN: [id number] Date of Birth: Jan 31, 1976  Transition of Care [facility]) CM/SW Contact:    Candie Chroman, LCSW Phone Number: 08/28/2019, 10:30 AM  Clinical Narrative:  CSW met with patient. No supports at bedside. CSW introduced role and explained that PT recommendations would be discussed. Patient agreeable to home health PT. Provided CMS scores for agencies that serve her zip code. She does not want "just any agency" due to a previous bad experience when she lived in [place]. Will call around to see who can take her. [facility] has already said they can accept her. Will follow up with patient once we have all offers. No further concerns. CSW encouraged patient to contact CSW as needed. CSW will continue to follow patient for support and facilitate return home when stable.                Expected Discharge Plan: [place] Barriers to Discharge: Continued Medical Work up   Patient Goals and CMS Choice   CMS Medicare.gov Compare Post Acute Care list provided to:: Patient    Expected Discharge Plan and Services Expected Discharge Plan: [place] Choice: [place] arrangements for the past 2 months: Single Family Home                                      Prior Living Arrangements/Services Living arrangements for the past 2 months: Single Family Home Lives with:: Spouse, Other (Comment)(and children according to therapy eval. Not sure if they are minors or adults.) Patient language and need for interpreter reviewed:: Yes Do you feel safe going back to the place where you live?: Yes      Need for Family Participation in Patient Care: Yes (Comment) Care giver support system in place?: Yes (comment)   Criminal Activity/Legal Involvement Pertinent to Current Situation/Hospitalization: No - Comment as needed  Activities of  Daily Living Home Assistive Devices/Equipment: Eyeglasses ADL Screening (condition at time of admission) Patient's cognitive ability adequate to safely complete daily activities?: No Is the patient deaf or have difficulty hearing?: No Does the patient have difficulty seeing, even when wearing glasses/contacts?: No Does the patient have difficulty concentrating, remembering, or making decisions?: Yes Patient able to express need for assistance with ADLs?: Yes Does the patient have difficulty dressing or bathing?: No Independently performs ADLs?: Yes (appropriate for developmental age) Does the patient have difficulty walking or climbing stairs?: No Weakness of Legs: None Weakness of Arms/Hands: None  Permission Sought/Granted Permission sought to share information with : Facility Art therapist granted to share information with : Yes, Verbal Permission Granted     Permission granted to share info w AGENCY: Home Health agencies        Emotional Assessment Appearance:: Appears stated age Attitude/Demeanor/Rapport: Engaged, Gracious Affect (typically observed): Accepting, Appropriate, Pleasant Orientation: : Oriented to Self, Oriented to Place, Oriented to  Time, Oriented to Situation Alcohol / Substance Use: Alcohol Use Psych Involvement: No (comment)  Admission diagnosis:  Dehydration [E86.0] Alcohol withdrawal syndrome, with delirium ([place]) [C78.938] Acute metabolic encephalopathy [B01.75] Hepatic encephalopathy ([place]) [K72.90] Patient Active Problem List   Diagnosis Date Noted  . Neutropenic fever ([place])   . Lobar pneumonia ([place])   . Hepatic encephalopathy ([place]) 08/23/2019  . Hypotension   .  Hallucination 08/22/2019  . Acute respiratory failure with hypoxia ([place]) 08/22/2019  . Acute metabolic encephalopathy 77/37/3668  . Acute hepatic encephalopathy 08/22/2019  . Sepsis ([place]) 07/10/2019  . CAP (community acquired pneumonia) 07/10/2019  . Abdominal pain  07/10/2019  . Hyponatremia 07/10/2019  . Hypokalemia 07/10/2019  . Anxiety 07/10/2019  . Chest pain 07/10/2019  . COPD (chronic obstructive pulmonary disease) ([place]) 07/10/2019  . HTN (hypertension) 07/10/2019  . Liver cirrhosis ([place])   . Symptomatic anemia 06/26/2019  . IDA (iron deficiency anemia) 06/26/2019  . Insomnia 04/03/2019  . Tachycardia 04/03/2019  . Acute pain of right knee 03/31/2019  . Bilateral leg edema 02/06/2019  . Generalized anxiety disorder 01/23/2019  . 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 Alcohol withdrawal syndrome ([place]) 01/20/2019  . Chronic hepatitis C without hepatic coma ([place]) 12/20/2018  . Thrombocytopenia ([place]) 12/20/2018  . Alcohol abuse 12/20/2018  . Transaminitis 12/20/2018  . Tobacco abuse 12/20/2018  . Easy bruising 12/20/2018  . Other fatigue 12/20/2018  . Folate deficiency 12/20/2018  . Hepatitis B core antibody negative 12/20/2018  . Charcot-Marie-Tooth disease    PCP:  Volney American, PA-C Pharmacy:   [id number] [place] [id number]: [phone]: [phone]    Social Determinants of Health (SDOH) Interventions    Readmission Risk Interventions Readmission Risk Prevention Plan 01/21/2019  Post Dischage Appt Complete  Medication Screening Complete  Transportation Screening Complete
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
## 2019-08-28 NOTE — Progress Notes (Signed)
[name], female   DOB: Oct 26, 1975, 44 y.o.   MRN: [id number] [facility] PROGRESS NOTE  Rhonda Martin [id number] DOB: 08/30/1975 DOA: 08/22/2019 PCP: Volney American, PA-C  HPI/Subjective: Patient states that she gets short of breath when she talks.  Asking for an inhaler.  Still short of breath.  Always having knee pain.  Had a lot of diarrhea with the lactulose last night.  Objective: Vitals:   08/28/19 0821 08/28/19 1326  BP: 107/61 107/60  Pulse: 74 92  Resp: 14 18  Temp: 99.2 F (37.3 C) 98.7 F (37.1 C)  SpO2: 95% (!) 82%    Filed Weights   08/22/19 0720  Weight: 58 kg    ROS: Review of Systems  Constitutional: Positive for fever.  Eyes: Negative for blurred vision.  Respiratory: Positive for cough and shortness of breath.   Cardiovascular: Negative for chest pain.  Gastrointestinal: Positive for abdominal pain, diarrhea and nausea. Negative for vomiting.  Genitourinary: Negative for dysuria.  Musculoskeletal: Positive for joint pain.  Neurological: Negative for headaches.   Exam: Physical Exam  Constitutional: She is oriented to person, place, and time.  HENT:  Nose: No mucosal edema.  Mouth/Throat: No oropharyngeal exudate or posterior oropharyngeal edema.  Eyes: Conjunctivae and lids are normal.  Neck: Carotid bruit is not present.  Cardiovascular: S1 normal and S2 normal. Tachycardia present. Exam reveals no gallop.  No murmur heard. Pulses:      Dorsalis pedis pulses are 3+ on the left side.  Respiratory: No respiratory distress. She has decreased breath sounds in the right lower field and the left lower field. She has no wheezes. She has no rhonchi. She has no rales.  GI: Soft. Bowel sounds are normal. There is no abdominal tenderness.  Musculoskeletal:     Right ankle: No swelling.     Left ankle: No swelling.  Lymphadenopathy:    She has no cervical adenopathy.  Neurological: She is alert and oriented to person,  place, and time. No cranial nerve deficit.  Skin: Skin is warm. No rash noted. Nails show no clubbing.  Psychiatric: Her mood appears anxious.      Data Reviewed: Basic Metabolic Panel: Recent Labs  Lab 08/22/19 0705 08/23/19 0450 08/26/19 0538 08/27/19 0640  NA 135 142 135 136  K 4.6 4.0 3.5 3.7  CL 105 113* 105 108  CO2 19* 25 23 20*  GLUCOSE 135* 107* 107* 98  BUN 6 8 6 11   CREATININE <0.30* 0.39* 0.44 0.46  CALCIUM 8.3* 8.0* 8.2* 8.1*   Liver Function Tests: Recent Labs  Lab 08/22/19 0705 08/26/19 0538  AST 86* 37  ALT 29 19  ALKPHOS 92 66  BILITOT 2.9* 1.6*  PROT 7.4 6.3*  ALBUMIN 3.3* 2.8*   Recent Labs  Lab 08/22/19 0753  LIPASE 27   Recent Labs  Lab 08/22/19 1455 08/24/19 0847 08/25/19 0525 08/27/19 0640  AMMONIA 55* 37* 64* 55*   CBC: Recent Labs  Lab 08/23/19 0450 08/24/19 0644 08/25/19 0525 08/26/19 0538 08/26/19 1524  WBC 2.7* 2.5* 2.6* 1.7* 1.8*  NEUTROABS  --  1.1*  --  1.2*  --   HGB 11.4* 10.3* 10.0* 10.4* 10.2*  HCT 34.8* 31.0* 29.4* 31.5* 30.6*  MCV 84.1 83.8 83.8 85.8 86.2  PLT 50* 59* 63* 53* 52*    CBG: Recent Labs  Lab 08/25/19 1144 08/26/19 0823 08/26/19 1154 08/27/19 0741 08/28/19 0802  GLUCAP 126* 97 86 128* 85    Recent  Results (from the past 240 hour(s))  Blood Culture (routine x 2)     Status: None   Collection Time: 08/22/19  7:50 AM   Specimen: BLOOD  Result Value Ref Range Status   Specimen Description BLOOD LEFT ANTECUBITAL  Final   Special Requests   Final    BOTTLES DRAWN AEROBIC AND ANAEROBIC Blood Culture adequate volume   Culture   Final    NO GROWTH 5 DAYS Performed at [facility], [address], [place], [place] [address]    Report Status 08/27/2019 FINAL  Final  Urine culture     Status: None   Collection Time: 08/22/19  7:50 AM   Specimen: In/Out Cath Urine  Result Value Ref Range Status   Specimen Description   Final    IN/OUT CATH URINE Performed at [facility], [address] [place], [place] [address]    Special Requests   Final    NONE Performed at [facility], [address] [place], [place] [address]    Culture   Final    NO GROWTH Performed at [facility], [place] [address], [place], [place] [address]    Report Status 08/23/2019 FINAL  Final  Blood Culture (routine x 2)     Status: None   Collection Time: 08/22/19  7:53 AM   Specimen: BLOOD  Result Value Ref Range Status   Specimen Description BLOOD LEFT HAND  Final   Special Requests   Final    BOTTLES DRAWN AEROBIC AND ANAEROBIC Blood Culture adequate volume   Culture   Final    NO GROWTH 5 DAYS Performed at [facility], [address] [place], [place] [address]    Report Status 08/27/2019 FINAL  Final  Respiratory Panel by RT PCR (Flu A&B, Covid) - Nasopharyngeal Swab     Status: None   Collection Time: 08/22/19 11:11 AM   Specimen: Nasopharyngeal Swab  Result Value Ref Range Status   SARS Coronavirus 2 by RT PCR NEGATIVE NEGATIVE Final    Comment: (NOTE) SARS-CoV-2 target nucleic acids are NOT DETECTED. The SARS-CoV-2 RNA is generally detectable in upper respiratoy specimens during the acute phase of infection. The lowest concentration of SARS-CoV-2 viral copies this assay can detect is 131 copies/mL. A negative result does not preclude SARS-Cov-2 infection and should not be used as the sole basis for treatment or other patient management decisions. A negative result may occur with  improper specimen collection/handling, submission of specimen other than nasopharyngeal swab, presence of viral mutation(s) within the areas targeted by this assay, and inadequate number of viral copies (<131 copies/mL). A negative result must be combined with clinical observations, patient history, and epidemiological information. The expected result is Negative. Fact Sheet for Patients:  PinkCheek.be Fact Sheet for Healthcare  Providers:  GravelBags.it This test is not yet ap proved or cleared by the [place] FDA and  has been authorized for detection and/or diagnosis of SARS-CoV-2 by FDA under an Emergency Use Authorization (EUA). This EUA will remain  in effect (meaning this test can be used) for the duration of the COVID-19 declaration under Section 564(b)(1) of the Act, 21 U.S.C. section 360bbb-3(b)(1), unless the authorization is terminated or revoked sooner.    Influenza A by PCR NEGATIVE NEGATIVE Final   Influenza B by PCR NEGATIVE NEGATIVE Final    Comment: (NOTE) The Xpert Xpress SARS-CoV-2/FLU/RSV assay is intended as an aid in  the diagnosis of influenza from Nasopharyngeal swab specimens  and  should not be used as a sole basis for treatment. Nasal washings and  aspirates are unacceptable for Xpert Xpress SARS-CoV-2/FLU/RSV  testing. Fact Sheet for Patients: PinkCheek.be Fact Sheet for Healthcare Providers: GravelBags.it This test is not yet approved or cleared by the [place] FDA and  has been authorized for detection and/or diagnosis of SARS-CoV-2 by  FDA under an Emergency Use Authorization (EUA). This EUA will remain  in effect (meaning this test can be used) for the duration of the  Covid-19 declaration under Section 564(b)(1) of the Act, 21  U.S.C. section 360bbb-3(b)(1), unless the authorization is  terminated or revoked. Performed at [facility], [address] [place], [place] [address]   CULTURE, BLOOD (ROUTINE X 2) w Reflex to ID Panel     Status: Abnormal (Preliminary result)   Collection Time: 08/26/19  5:38 AM   Specimen: BLOOD LEFT FOREARM  Result Value Ref Range Status   Specimen Description BLOOD LEFT FOREARM  Final   Special Requests   Final    BOTTLES DRAWN AEROBIC AND ANAEROBIC Blood Culture results may not be optimal due to an excessive volume of blood received in  culture bottles   Culture  Setup Time (A)  Final    YEAST AEROBIC BOTTLE ONLY CRITICAL VALUE NOTED.  VALUE IS CONSISTENT WITH PREVIOUSLY REPORTED AND CALLED VALUE. Performed at [facility], [address], [place], [place] [address]    Culture YEAST (A)  Final   Report Status PENDING  Incomplete  CULTURE, BLOOD (ROUTINE X 2) w Reflex to ID Panel     Status: None (Preliminary result)   Collection Time: 08/26/19  5:38 AM   Specimen: BLOOD  Result Value Ref Range Status   Specimen Description   Final    BLOOD RIGHT MIDLINE Performed at [facility], [address], [place], [place] [address]    Special Requests   Final    BOTTLES DRAWN AEROBIC AND ANAEROBIC Blood Culture adequate volume Performed at [facility], [address] [place], [place] [address]    Culture  Setup Time   Final    YEAST IN BOTH AEROBIC AND ANAEROBIC BOTTLES CRITICAL RESULT CALLED TO, READ BACK BY AND VERIFIED WITH: MICHAEL SIMPSON AT [id number] 08/27/19.PMF Performed at [facility], [place] [address] [place], [place] [address]    Culture YEAST  Final   Report Status PENDING  Incomplete  Blood Culture ID Panel (Reflexed)     Status: Abnormal   Collection Time: 08/26/19  5:38 AM  Result Value Ref Range Status   Enterococcus species NOT DETECTED NOT DETECTED Final   Listeria monocytogenes NOT DETECTED NOT DETECTED Final   Staphylococcus species NOT DETECTED NOT DETECTED Final   Staphylococcus aureus (BCID) NOT DETECTED NOT DETECTED Final   Streptococcus species NOT DETECTED NOT DETECTED Final   Streptococcus agalactiae NOT DETECTED NOT DETECTED Final   Streptococcus pneumoniae NOT DETECTED NOT DETECTED Final   Streptococcus pyogenes NOT DETECTED NOT DETECTED Final   Acinetobacter baumannii NOT DETECTED NOT DETECTED Final   Enterobacteriaceae species NOT DETECTED NOT DETECTED Final   Enterobacter cloacae complex NOT DETECTED NOT DETECTED Final   Escherichia coli NOT DETECTED  NOT DETECTED Final   Klebsiella oxytoca NOT DETECTED NOT DETECTED Final   Klebsiella pneumoniae NOT DETECTED NOT DETECTED Final   Proteus species NOT DETECTED NOT DETECTED Final   Serratia marcescens NOT DETECTED NOT DETECTED Final   Haemophilus influenzae NOT DETECTED NOT DETECTED Final  Neisseria meningitidis NOT DETECTED NOT DETECTED Final   Pseudomonas aeruginosa NOT DETECTED NOT DETECTED Final   Candida albicans DETECTED (A) NOT DETECTED Final    Comment: CRITICAL RESULT CALLED TO, READ BACK BY AND VERIFIED WITH: MICHAEL SIMPSON AT [id number] 08/27/19.PMF    Candida glabrata DETECTED (A) NOT DETECTED Final    Comment: CRITICAL RESULT CALLED TO, READ BACK BY AND VERIFIED WITH: MICHAEL SIMPSON AT [id number] 08/27/19.PMF    Candida krusei NOT DETECTED NOT DETECTED Final   Candida parapsilosis NOT DETECTED NOT DETECTED Final   Candida tropicalis NOT DETECTED NOT DETECTED Final    Comment: Performed at [facility], [address] [place], [place] [address]     Studies: ECHOCARDIOGRAM COMPLETE  Result Date: 08/27/2019    ECHOCARDIOGRAM REPORT   Patient Name:   DILYNN POLLNOW Date of Exam: 08/27/2019 Medical Rec #:  [id number]     Height:       62.0 in Accession #:    [id number]    Weight:       127.9 lb Date of Birth:  1975/07/14     BSA:          1.581 m Patient Age:    43 years      BP:           89/48 mmHg Patient Gender: F             HR:           102 bpm. Exam Location:  [facility] Procedure: 2D Echo Indications:     BACTEREMIA 790.7/R78.81  History:         Patient has prior history of Echocardiogram examinations, most                  recent 02/07/2019. Risk Factors:Current Smoker.  Sonographer:     Avanell Shackleton Referring Phys:  [id number] Loletha Grayer Diagnosing Phys: Yolonda Kida MD IMPRESSIONS  1. Left ventricular ejection fraction, by estimation, is 60 to 65%. The left ventricle has normal function. The left ventricle has no regional wall motion abnormalities. Left ventricular diastolic  parameters were normal.  2. Right ventricular systolic function is normal. The right ventricular size is normal. There is mildly elevated pulmonary artery systolic pressure.  3. The mitral valve is normal in structure. No evidence of mitral valve regurgitation.  4. The aortic valve is grossly normal. Aortic valve regurgitation is not visualized. FINDINGS  Left Ventricle: Left ventricular ejection fraction, by estimation, is 60 to 65%. The left ventricle has normal function. The left ventricle has no regional wall motion abnormalities. The left ventricular internal cavity size was normal in size. There is  no left ventricular hypertrophy. Left ventricular diastolic parameters were normal. Right Ventricle: The right ventricular size is normal. No increase in right ventricular wall thickness. Right ventricular systolic function is normal. There is mildly elevated pulmonary artery systolic pressure. The tricuspid regurgitant velocity is 2.71  m/s, and with an assumed right atrial pressure of 10 mmHg, the estimated right ventricular systolic pressure is [id number] mmHg. Left Atrium: Left atrial size was normal in size. Right Atrium: Right atrial size was normal in size. Pericardium: There is no evidence of pericardial effusion. Mitral Valve: The mitral valve is normal in structure. No evidence of mitral valve regurgitation. Tricuspid Valve: The tricuspid valve is normal in structure. Tricuspid valve regurgitation is not demonstrated. Aortic Valve: The aortic valve is grossly normal. Aortic valve regurgitation is not visualized. Pulmonic Valve: The pulmonic valve was  normal in structure. Pulmonic valve regurgitation is not visualized. Aorta: The aortic root is normal in size and structure. IAS/Shunts: No atrial level shunt detected by color flow Doppler.  LEFT VENTRICLE PLAX 2D LVIDd:         4.89 cm  Diastology LVIDs:         3.29 cm  LV e' lateral:   11.90 cm/s LV PW:         0.92 cm  LV E/e' lateral: 10.0 LV IVS:        0.74  cm LVOT diam:     1.80 cm LVOT Area:     2.54 cm  LEFT ATRIUM             Index       RIGHT ATRIUM           Index LA diam:        3.70 cm 2.34 cm/m  RA Area:     18.10 cm LA Vol (A2C):   76.0 ml 48.08 ml/m RA Volume:   51.30 ml  32.45 ml/m LA Vol (A4C):   60.6 ml 38.34 ml/m LA Biplane Vol: 68.5 ml 43.34 ml/m   AORTA Ao Root diam: 2.60 cm MITRAL VALVE                TRICUSPID VALVE MV Area (PHT): 6.54 cm     TR Peak grad:   29.4 mmHg MV Decel Time: 116 msec     TR Vmax:        271.00 cm/s MV E velocity: 119.00 cm/s MV A velocity: 123.00 cm/s  SHUNTS MV E/A ratio:  0.97         Systemic Diam: 1.80 cm Dwayne Prince Rome MD Electronically signed by Yolonda Kida MD Signature Date/Time: 08/27/2019/11:47:50 PM    Final     Scheduled Meds: . ferrous sulfate  325 mg Oral BID  . folic acid  1 mg Oral Daily  . multivitamin with minerals  1 tablet Oral Daily  . nicotine  21 mg Transdermal Daily  . pantoprazole  40 mg Oral Daily  . QUEtiapine  25 mg Oral QHS  . rifaximin  550 mg Oral BID  . sodium chloride flush  10-40 mL Intracatheter Q12H  . sucralfate  1 g Oral TID WC & HS  . [START ON 08/29/2019] thiamine  100 mg Oral Daily   Continuous Infusions: . sodium chloride 30 mL (08/28/19 1029)  . anidulafungin 100 mg (08/28/19 1033)    Assessment/Plan:  1. Sepsis with Candida, with acute encephalopathy.  Eraxis started yesterday after positive blood culture report. Case discussed with ID.  Patient had a midline placed on the 18th.  Midline removed. Echocardiogram did not show any vegitation.  Of note, initial blood cultures on the 16th were negative but the patient did not have fever at that time. 2. Neutropenic fever despite being on antibiotics during the hospital course.  Started on Eraxis and other antibiotics discontinued. 3. Hallucinations here in the hospital.  Wondering if this is secondary to Candida bacteremia rather than other things that I have been treating.  Changed high-dose IV  thiamine over to daily thiamine.  Continue Xifaxan for possible hepatic encephalopathy.  Lactulose held secondary to diarrhea.  Nurse noted some auditory hallucinations and I messaged psychiatry on that. 4. Liver cirrhosis with thrombocytopenia.  No schistocytes seen on peripheral smear.  Continue to monitor platelet count.  Check CBC tomorrow 5. Right knee and ankle pain.  Appreciate  orthopedic consultation.  Physical therapy to work with patient with knee brace.  Patient taking pain medications every 6 hours 6. Alcohol abuse.  As needed Ativan 7. Iron deficiency anemia on oral iron 8. Acute hypoxic respiratory failure.  Patient tapered off oxygen.  Last pulse ox was reading low so I will have the nurse check if that is real.  If it is low then can go back on oxygen.  Order Ruthe Mannan inhaler.  Patient has as needed nebulizer treatments. 9. Relative hypotension.   10. Anxiety and insomnia.  Patient slept last night with Seroquel. 11. Slightly elevated INR will give vitamin K.  Recheck INR tomorrow  Code Status:     Code Status Orders  (From admission, onward)         Start     Ordered   08/22/19 1456  Full code  Continuous     08/22/19 1457        Code Status History    Date Active Date Inactive Code Status Order ID Comments User Context   07/10/2019 1934 07/17/2019 1851 Full Code [id number]  Ivor Costa, MD ED   02/06/2019 1815 02/08/2019 1805 Full Code [id number]  Vaughan Basta, MD Inpatient   01/20/2019 1321 01/27/2019 1710 Full Code [id number]  Lang Snow, NP ED   Advance Care Planning Activity     Family Communication: Spoke with husband Nathaneil Canary on the phone at [phone] Disposition Plan: With Candida bacteremia, will need treatment for a few days in the hospital with IV Eraxis.  Infectious disease to see the patient today and give recommendations on length of therapy and whether it needs to be IV versus oral and whether TEE is needed or  not.  Antibiotics:  Eraxis  Time spent: 28 minutes  [place]

## 2019-08-29 LAB — GLUCOSE, CAPILLARY: Glucose-Capillary: 92 mg/dL (ref 70–99)

## 2019-08-29 LAB — CBC
HCT: 31.7 % — ABNORMAL LOW (ref 36.0–46.0)
Hemoglobin: 10.4 g/dL — ABNORMAL LOW (ref 12.0–15.0)
MCH: 28 pg (ref 26.0–34.0)
MCHC: 32.8 g/dL (ref 30.0–36.0)
MCV: 85.4 fL (ref 80.0–100.0)
Platelets: 88 10*3/uL — ABNORMAL LOW (ref 150–400)
RBC: 3.71 MIL/uL — ABNORMAL LOW (ref 3.87–5.11)
RDW: 24.9 % — ABNORMAL HIGH (ref 11.5–15.5)
WBC: 4.3 10*3/uL (ref 4.0–10.5)
nRBC: 0 % (ref 0.0–0.2)

## 2019-08-29 LAB — COMPREHENSIVE METABOLIC PANEL
ALT: 19 U/L (ref 0–44)
AST: 42 U/L — ABNORMAL HIGH (ref 15–41)
Albumin: 2.9 g/dL — ABNORMAL LOW (ref 3.5–5.0)
Alkaline Phosphatase: 76 U/L (ref 38–126)
Anion gap: 6 (ref 5–15)
BUN: <5 mg/dL — ABNORMAL LOW (ref 6–20)
CO2: 24 mmol/L (ref 22–32)
Calcium: 8.3 mg/dL — ABNORMAL LOW (ref 8.9–10.3)
Chloride: 110 mmol/L (ref 98–111)
Creatinine, Ser: 0.31 mg/dL — ABNORMAL LOW (ref 0.44–1.00)
GFR calc Af Amer: >60 mL/min (ref >60)
GFR calc non Af Amer: >60 mL/min (ref >60)
Glucose, Bld: 101 mg/dL — ABNORMAL HIGH (ref 70–99)
Potassium: 3.4 mmol/L — ABNORMAL LOW (ref 3.5–5.1)
Sodium: 140 mmol/L (ref 135–145)
Total Bilirubin: 1.2 mg/dL (ref 0.3–1.2)
Total Protein: 6.6 g/dL (ref 6.5–8.1)

## 2019-08-29 LAB — PROTIME-INR
INR: 1.3 — ABNORMAL HIGH (ref 0.8–1.2)
Prothrombin Time: 16 seconds — ABNORMAL HIGH (ref 11.4–15.2)

## 2019-08-29 LAB — AMMONIA: Ammonia: 38 umol/L — ABNORMAL HIGH (ref 9–35)

## 2019-08-29 MED ORDER — POTASSIUM CHLORIDE 20 MEQ PO PACK
20.0000 meq | PACK | Freq: Once | ORAL | Status: AC
  Administered 2019-08-29: 20 meq via ORAL
  Filled 2019-08-29: qty 1

## 2019-08-29 NOTE — Progress Notes (Signed)
[name], female   DOB: 20-Dec-1975, 44 y.o.   MRN: [id number] [facility] PROGRESS NOTE  MAZELLA MUTHIG [id number] DOB: 03-03-1976 DOA: 08/22/2019 PCP: Volney American, PA-C  HPI/Subjective: Patient feeling a little bit better today.  Breathing a little bit better today.  Still has right knee pain and ankle pain.  Objective: Vitals:   08/28/19 2137 08/29/19 0356  BP: 103/62 (!) 108/50  Pulse: 97 93  Resp: 18 16  Temp: 99.4 F (37.4 C) 100 F (37.8 C)  SpO2: 96% 98%    Intake/Output Summary (Last 24 hours) at 08/29/2019 1359 Last data filed at 08/29/2019 1031 Gross per 24 hour  Intake 178.95 ml  Output 250 ml  Net -71.05 ml   Filed Weights   08/22/19 0720  Weight: 58 kg    ROS: Review of Systems  Constitutional: Negative for chills and fever.  Eyes: Negative for blurred vision.  Respiratory: Positive for shortness of breath. Negative for cough.   Cardiovascular: Negative for chest pain.  Gastrointestinal: Positive for abdominal pain. Negative for constipation, diarrhea, nausea and vomiting.  Genitourinary: Negative for dysuria.  Musculoskeletal: Positive for joint pain.  Neurological: Negative for dizziness and headaches.  Psychiatric/Behavioral: The patient is nervous/anxious.    Exam: Physical Exam  HENT:  Nose: No mucosal edema.  Mouth/Throat: No oropharyngeal exudate or posterior oropharyngeal edema.  Eyes: Conjunctivae and lids are normal.  Cardiovascular: S1 normal and S2 normal. Exam reveals no gallop.  No murmur heard. Respiratory: No respiratory distress. She has decreased breath sounds in the right lower field and the left lower field. She has no wheezes. She has no rhonchi. She has no rales.  GI: Soft. Bowel sounds are normal. There is no abdominal tenderness.  Musculoskeletal:     Right knee: Swelling present. Decreased range of motion.     Right ankle: No swelling. Decreased range of motion.     Left ankle: No swelling.   Lymphadenopathy:    She has no cervical adenopathy.  Neurological: She is alert. No cranial nerve deficit.  Skin: Skin is warm. No rash noted. Nails show no clubbing.  Psychiatric:  Patient seems to be doing better with regards to her mental status.  Answers questions appropriately.      Data Reviewed: Basic Metabolic Panel: Recent Labs  Lab 08/23/19 0450 08/26/19 0538 08/27/19 0640 08/29/19 0535  NA 142 135 136 140  K 4.0 3.5 3.7 3.4*  CL 113* 105 108 110  CO2 25 23 20* 24  GLUCOSE 107* 107* 98 101*  BUN 8 6 11  <5*  CREATININE 0.39* 0.44 0.46 0.31*  CALCIUM 8.0* 8.2* 8.1* 8.3*   Liver Function Tests: Recent Labs  Lab 08/26/19 0538 08/29/19 0535  AST 37 42*  ALT 19 19  ALKPHOS 66 76  BILITOT 1.6* 1.2  PROT 6.3* 6.6  ALBUMIN 2.8* 2.9*    Recent Labs  Lab 08/22/19 1455 08/24/19 0847 08/25/19 0525 08/27/19 0640 08/29/19 0535  AMMONIA 55* 37* 64* 55* 38*   CBC: Recent Labs  Lab 08/24/19 0644 08/25/19 0525 08/26/19 0538 08/26/19 1524 08/29/19 0535  WBC 2.5* 2.6* 1.7* 1.8* 4.3  NEUTROABS 1.1*  --  1.2*  --   --   HGB 10.3* 10.0* 10.4* 10.2* 10.4*  HCT 31.0* 29.4* 31.5* 30.6* 31.7*  MCV 83.8 83.8 85.8 86.2 85.4  PLT 59* 63* 53* 52* 88*    CBG: Recent Labs  Lab 08/26/19 0823 08/26/19 1154 08/27/19 0741 08/28/19 0802 08/29/19 [id number]  GLUCAP 97 86 128* 85 92    Recent Results (from the past 240 hour(s))  Blood Culture (routine x 2)     Status: None   Collection Time: 08/22/19  7:50 AM   Specimen: BLOOD  Result Value Ref Range Status   Specimen Description BLOOD LEFT ANTECUBITAL  Final   Special Requests   Final    BOTTLES DRAWN AEROBIC AND ANAEROBIC Blood Culture adequate volume   Culture   Final    NO GROWTH 5 DAYS Performed at [facility], [address], [place], [place] [address]    Report Status 08/27/2019 FINAL  Final  Urine culture     Status: None   Collection Time: 08/22/19  7:50 AM   Specimen: In/Out Cath Urine   Result Value Ref Range Status   Specimen Description   Final    IN/OUT CATH URINE Performed at [facility], [address] [place], [place] [address]    Special Requests   Final    NONE Performed at [facility], [address] [place], [place] [address]    Culture   Final    NO GROWTH Performed at [facility], [place] [address], [place], [place] [address]    Report Status 08/23/2019 FINAL  Final  Blood Culture (routine x 2)     Status: None   Collection Time: 08/22/19  7:53 AM   Specimen: BLOOD  Result Value Ref Range Status   Specimen Description BLOOD LEFT HAND  Final   Special Requests   Final    BOTTLES DRAWN AEROBIC AND ANAEROBIC Blood Culture adequate volume   Culture   Final    NO GROWTH 5 DAYS Performed at [facility], [address] [place],  [address]    Report Status 08/27/2019 FINAL  Final  Respiratory Panel by RT PCR (Flu A&B, Covid) - Nasopharyngeal Swab     Status: None   Collection Time: 08/22/19 11:11 AM   Specimen: Nasopharyngeal Swab  Result Value Ref Range Status   SARS Coronavirus 2 by RT PCR NEGATIVE NEGATIVE Final    Comment: (NOTE) SARS-CoV-2 target nucleic acids are NOT DETECTED. The SARS-CoV-2 RNA is generally detectable in upper respiratoy specimens during the acute phase of infection. The lowest concentration of SARS-CoV-2 viral copies this assay can detect is 131 copies/mL. A negative result does not preclude SARS-Cov-2 infection and should not be used as the sole basis for treatment or other patient management decisions. A negative result may occur with  improper specimen collection/handling, submission of specimen other than nasopharyngeal swab, presence of viral mutation(s) within the areas targeted by this assay, and inadequate number of viral copies (<131 copies/mL). A negative result must be combined with clinical observations, patient history, and epidemiological information. The expected  result is Negative. Fact Sheet for Patients:  PinkCheek.be Fact Sheet for Healthcare Providers:  GravelBags.it This test is not yet ap proved or cleared by the [place] FDA and  has been authorized for detection and/or diagnosis of SARS-CoV-2 by FDA under an Emergency Use Authorization (EUA). This EUA will remain  in effect (meaning this test can be used) for the duration of the COVID-19 declaration under Section 564(b)(1) of the Act, 21 U.S.C. section 360bbb-3(b)(1), unless the authorization is terminated or revoked sooner.    Influenza A by PCR NEGATIVE NEGATIVE Final   Influenza B by PCR NEGATIVE NEGATIVE Final    Comment: (NOTE) The Xpert Xpress SARS-CoV-2/FLU/RSV assay is intended as an aid  in  the diagnosis of influenza from Nasopharyngeal swab specimens and  should not be used as a sole basis for treatment. Nasal washings and  aspirates are unacceptable for Xpert Xpress SARS-CoV-2/FLU/RSV  testing. Fact Sheet for Patients: PinkCheek.be Fact Sheet for Healthcare Providers: GravelBags.it This test is not yet approved or cleared by the [place] FDA and  has been authorized for detection and/or diagnosis of SARS-CoV-2 by  FDA under an Emergency Use Authorization (EUA). This EUA will remain  in effect (meaning this test can be used) for the duration of the  Covid-19 declaration under Section 564(b)(1) of the Act, 21  U.S.C. section 360bbb-3(b)(1), unless the authorization is  terminated or revoked. Performed at [facility], [address] [place], [place] [address]   CULTURE, BLOOD (ROUTINE X 2) w Reflex to ID Panel     Status: Abnormal (Preliminary result)   Collection Time: 08/26/19  5:38 AM   Specimen: BLOOD LEFT FOREARM  Result Value Ref Range Status   Specimen Description BLOOD LEFT FOREARM  Final   Special Requests   Final    BOTTLES  DRAWN AEROBIC AND ANAEROBIC Blood Culture results may not be optimal due to an excessive volume of blood received in culture bottles   Culture  Setup Time (A)  Final    YEAST AEROBIC BOTTLE ONLY CRITICAL VALUE NOTED.  VALUE IS CONSISTENT WITH PREVIOUSLY REPORTED AND CALLED VALUE. Performed at [facility], [address], [place], [place] [address]    Culture YEAST (A)  Final   Report Status PENDING  Incomplete  CULTURE, BLOOD (ROUTINE X 2) w Reflex to ID Panel     Status: Abnormal (Preliminary result)   Collection Time: 08/26/19  5:38 AM   Specimen: BLOOD  Result Value Ref Range Status   Specimen Description   Final    BLOOD RIGHT MIDLINE Performed at [facility], [address], [place], [place] [address]    Special Requests   Final    BOTTLES DRAWN AEROBIC AND ANAEROBIC Blood Culture adequate volume Performed at [facility], [address] [place], [place] [address]    Culture  Setup Time   Final    YEAST IN BOTH AEROBIC AND ANAEROBIC BOTTLES CRITICAL RESULT CALLED TO, READ BACK BY AND VERIFIED WITH: MICHAEL SIMPSON AT [id number] 08/27/19.PMF Performed at [facility], [place] [address] [place], [place] [address]    Culture CANDIDA ALBICANS CANDIDA GLABRATA  (A)  Final   Report Status PENDING  Incomplete  Blood Culture ID Panel (Reflexed)     Status: Abnormal   Collection Time: 08/26/19  5:38 AM  Result Value Ref Range Status   Enterococcus species NOT DETECTED NOT DETECTED Final   Listeria monocytogenes NOT DETECTED NOT DETECTED Final   Staphylococcus species NOT DETECTED NOT DETECTED Final   Staphylococcus aureus (BCID) NOT DETECTED NOT DETECTED Final   Streptococcus species NOT DETECTED NOT DETECTED Final   Streptococcus agalactiae NOT DETECTED NOT DETECTED Final   Streptococcus pneumoniae NOT DETECTED NOT DETECTED Final   Streptococcus pyogenes NOT DETECTED NOT DETECTED Final   Acinetobacter baumannii NOT DETECTED NOT DETECTED Final    Enterobacteriaceae species NOT DETECTED NOT DETECTED Final   Enterobacter cloacae complex NOT DETECTED NOT DETECTED Final   Escherichia coli NOT DETECTED NOT DETECTED Final   Klebsiella oxytoca NOT DETECTED NOT DETECTED Final   Klebsiella pneumoniae NOT DETECTED NOT DETECTED Final   Proteus species NOT DETECTED NOT DETECTED Final   Serratia marcescens NOT  DETECTED NOT DETECTED Final   Haemophilus influenzae NOT DETECTED NOT DETECTED Final   Neisseria meningitidis NOT DETECTED NOT DETECTED Final   Pseudomonas aeruginosa NOT DETECTED NOT DETECTED Final   Candida albicans DETECTED (A) NOT DETECTED Final    Comment: CRITICAL RESULT CALLED TO, READ BACK BY AND VERIFIED WITH: MICHAEL SIMPSON AT [id number] 08/27/19.PMF    Candida glabrata DETECTED (A) NOT DETECTED Final    Comment: CRITICAL RESULT CALLED TO, READ BACK BY AND VERIFIED WITH: MICHAEL SIMPSON AT [id number] 08/27/19.PMF    Candida krusei NOT DETECTED NOT DETECTED Final   Candida parapsilosis NOT DETECTED NOT DETECTED Final   Candida tropicalis NOT DETECTED NOT DETECTED Final    Comment: Performed at [facility], [address] [place], [place] [address]  CULTURE, BLOOD (ROUTINE X 2) w Reflex to ID Panel     Status: None (Preliminary result)   Collection Time: 08/28/19  3:05 PM   Specimen: BLOOD  Result Value Ref Range Status   Specimen Description BLOOD BLOOD RIGHT HAND  Final   Special Requests   Final    BOTTLES DRAWN AEROBIC AND ANAEROBIC Blood Culture adequate volume   Culture   Final    NO GROWTH < 24 HOURS Performed at [facility], [address] [place], [place] [address]    Report Status PENDING  Incomplete  CULTURE, BLOOD (ROUTINE X 2) w Reflex to ID Panel     Status: None (Preliminary result)   Collection Time: 08/28/19  3:05 PM   Specimen: BLOOD  Result Value Ref Range Status   Specimen Description BLOOD RIGHT ANTECUBITAL  Final   Special Requests   Final    BOTTLES DRAWN AEROBIC AND ANAEROBIC Blood  Culture adequate volume   Culture   Final    NO GROWTH < 24 HOURS Performed at [facility], [address] [place], [place] [address]    Report Status PENDING  Incomplete     Studies: ECHOCARDIOGRAM COMPLETE  Result Date: 08/27/2019    ECHOCARDIOGRAM REPORT   Patient Name:   DORCUS ALEJANDRE Date of Exam: 08/27/2019 Medical Rec #:  [id number]     Height:       62.0 in Accession #:    [id number]    Weight:       127.9 lb Date of Birth:  Dec 08, 1975     BSA:          1.581 m Patient Age:    57 years      BP:           89/48 mmHg Patient Gender: F             HR:           102 bpm. Exam Location:  [facility] Procedure: 2D Echo Indications:     BACTEREMIA 790.7/R78.81  History:         Patient has prior history of Echocardiogram examinations, most                  recent 02/07/2019. Risk Factors:Current Smoker.  Sonographer:     Avanell Shackleton Referring Phys:  [id number] Loletha Grayer Diagnosing Phys: Yolonda Kida MD IMPRESSIONS  1. Left ventricular ejection fraction, by estimation, is 60 to 65%. The left ventricle has normal function. The left ventricle has no regional wall motion abnormalities. Left ventricular diastolic parameters were normal.  2. Right ventricular systolic function is normal. The right ventricular size is normal. There is mildly elevated pulmonary artery systolic  pressure.  3. The mitral valve is normal in structure. No evidence of mitral valve regurgitation.  4. The aortic valve is grossly normal. Aortic valve regurgitation is not visualized. FINDINGS  Left Ventricle: Left ventricular ejection fraction, by estimation, is 60 to 65%. The left ventricle has normal function. The left ventricle has no regional wall motion abnormalities. The left ventricular internal cavity size was normal in size. There is  no left ventricular hypertrophy. Left ventricular diastolic parameters were normal. Right Ventricle: The right ventricular size is normal. No increase in right ventricular wall thickness.  Right ventricular systolic function is normal. There is mildly elevated pulmonary artery systolic pressure. The tricuspid regurgitant velocity is 2.71  m/s, and with an assumed right atrial pressure of 10 mmHg, the estimated right ventricular systolic pressure is [id number] mmHg. Left Atrium: Left atrial size was normal in size. Right Atrium: Right atrial size was normal in size. Pericardium: There is no evidence of pericardial effusion. Mitral Valve: The mitral valve is normal in structure. No evidence of mitral valve regurgitation. Tricuspid Valve: The tricuspid valve is normal in structure. Tricuspid valve regurgitation is not demonstrated. Aortic Valve: The aortic valve is grossly normal. Aortic valve regurgitation is not visualized. Pulmonic Valve: The pulmonic valve was normal in structure. Pulmonic valve regurgitation is not visualized. Aorta: The aortic root is normal in size and structure. IAS/Shunts: No atrial level shunt detected by color flow Doppler.  LEFT VENTRICLE PLAX 2D LVIDd:         4.89 cm  Diastology LVIDs:         3.29 cm  LV e' lateral:   11.90 cm/s LV PW:         0.92 cm  LV E/e' lateral: 10.0 LV IVS:        0.74 cm LVOT diam:     1.80 cm LVOT Area:     2.54 cm  LEFT ATRIUM             Index       RIGHT ATRIUM           Index LA diam:        3.70 cm 2.34 cm/m  RA Area:     18.10 cm LA Vol (A2C):   76.0 ml 48.08 ml/m RA Volume:   51.30 ml  32.45 ml/m LA Vol (A4C):   60.6 ml 38.34 ml/m LA Biplane Vol: 68.5 ml 43.34 ml/m   AORTA Ao Root diam: 2.60 cm MITRAL VALVE                TRICUSPID VALVE MV Area (PHT): 6.54 cm     TR Peak grad:   29.4 mmHg MV Decel Time: 116 msec     TR Vmax:        271.00 cm/s MV E velocity: 119.00 cm/s MV A velocity: 123.00 cm/s  SHUNTS MV E/A ratio:  0.97         Systemic Diam: 1.80 cm Dwayne Prince Rome MD Electronically signed by Yolonda Kida MD Signature Date/Time: 08/27/2019/11:47:50 PM    Final     Scheduled Meds: . ferrous sulfate  325 mg Oral BID  .  folic acid  1 mg Oral Daily  . mometasone-formoterol  2 puff Inhalation BID  . multivitamin with minerals  1 tablet Oral Daily  . nicotine  21 mg Transdermal Daily  . pantoprazole  40 mg Oral Daily  . QUEtiapine  25 mg Oral QHS  . rifaximin  550 mg Oral  BID  . sodium chloride flush  10-40 mL Intracatheter Q12H  . sucralfate  1 g Oral TID WC & HS  . thiamine  100 mg Oral Daily   Continuous Infusions: . sodium chloride Stopped (08/28/19 1508)  . anidulafungin 78 mL/hr at 08/29/19 1031    Assessment/Plan:  1. Sepsis with Candida with acute encephalopathy.  Continue Eraxis.  Repeat blood cultures so far negative for 24 hours.  If negative for 48 hours potentially can make a disposition.  Midline removed which could have been the source of infection.  Echocardiogram did not show any source of vegetation.  Of note, initial blood cultures on the 16th were negative. 2. Neutropenic fever.  White blood cell count has improved.  Other antibiotics that were given during the hospital course have been stopped and currently on Eraxis. 3. Hallucinations here in the hospital.  I am wondering if this is secondary to Candida sepsis.  I also gave high-dose thiamine for a few days.  We ended up treating for hepatic encephalopathy with borderline ammonia level with lactulose but that caused too much diarrhea so now she is on Xifaxan.  Could also be alcohol withdrawal. 4. Liver cirrhosis with thrombocytopenia and leukopenia.  No schistocytes were seen on the peripheral smear.  Platelet counts finally starting to come up to 88.  The further away from alcohol the better. 5. Right knee pain and ankle pain.  Appreciate orthopedic consultation.  Knee brace and physical therapy consultations appreciated. 6. Alcohol abuse.  Patient must stop alcohol. 7. Iron deficiency anemia on oral iron 8. Acute hypoxic respiratory failure.  This has resolved.  Patient off oxygen.  Patient on [facility] inhaler. 9. Relative hypotension.   Cardiac meds held. 10. Anxiety and insomnia.  Patient slept last night with Seroquel.  Code Status:     Code Status Orders  (From admission, onward)         Start     Ordered   08/22/19 1456  Full code  Continuous     08/22/19 1457        Code Status History    Date Active Date Inactive Code Status Order ID Comments User Context   07/10/2019 1934 07/17/2019 1851 Full Code [id number]  Ivor Costa, MD ED   02/06/2019 1815 02/08/2019 1805 Full Code [id number]  Vaughan Basta, MD Inpatient   01/20/2019 1321 01/27/2019 1710 Full Code [id number]  Lang Snow, NP ED   Advance Care Planning Activity     Family Communication: Spoke with husband Nathaneil Canary on the phone at [phone] Disposition Plan: Following up for repeat blood cultures, so far negative less than 24 hours.  Patient had Candida sepsis.  Patient on IV Eraxis.  Treatment length of therapy and course of therapy (IV versus p.o.) up to infectious disease doctor.  Hopefully if repeat cultures are negative in 48 hours can consider discharge home with home health.  Consultants:  Infectious disease  Orthopedic surgery  Psychiatry  Antibiotics:  Eraxis  Time spent: 28 minutes  [place]

## 2019-08-29 NOTE — Progress Notes (Signed)
[facility] INFECTIOUS DISE[facility] PROGRESS NOTE Date of Admission:  08/22/2019     ID: Rhonda Martin is a 44 y.o. female with candidemia Principal Problem:   Hallucination Active Problems:   Alcohol abuse   Tobacco abuse   Acute pain of right knee   IDA (iron deficiency anemia)   Anxiety   COPD (chronic obstructive pulmonary disease) (HCC)   Liver cirrhosis (HCC)   HTN (hypertension)   Acute respiratory failure with hypoxia (HCC)   Acute metabolic encephalopathy   Acute hepatic encephalopathy   Hepatic encephalopathy (HCC)   Hypotension   Lobar pneumonia (HCC)   Neutropenic fever (HCC)   Subjective: Reports feeling anxious today. Denies abd pain, nvd. No fevers, chills  ROS  Eleven systems are reviewed and negative except per hpi  Medications:  Antibiotics Given (last 72 hours)    Date/Time Action Medication Dose Rate   08/26/19 1517 New Bag/Given   ceFEPIme (MAXIPIME) 2 g in sodium chloride 0.9 % 100 mL IVPB 2 g 200 mL/hr   08/26/19 2218 Given   rifaximin (XIFAXAN) tablet 550 mg 550 mg    08/26/19 2336 New Bag/Given   ceFEPIme (MAXIPIME) 2 g in sodium chloride 0.9 % 100 mL IVPB 2 g 200 mL/hr   08/27/19 0550 New Bag/Given   ceFEPIme (MAXIPIME) 2 g in sodium chloride 0.9 % 100 mL IVPB 2 g 200 mL/hr   08/27/19 0631 New Bag/Given   vancomycin (VANCOREADY) IVPB 1250 mg/250 mL 1,250 mg 166.7 mL/hr   08/27/19 1201 Given   rifaximin (XIFAXAN) tablet 550 mg 550 mg    08/27/19 2133 Given   rifaximin (XIFAXAN) tablet 550 mg 550 mg    08/28/19 0918 Given   rifaximin (XIFAXAN) tablet 550 mg 550 mg    08/28/19 2042 Given   rifaximin (XIFAXAN) tablet 550 mg 550 mg    08/29/19 0909 Given   rifaximin (XIFAXAN) tablet 550 mg 550 mg      . ferrous sulfate  325 mg Oral BID  . folic acid  1 mg Oral Daily  . mometasone-formoterol  2 puff Inhalation BID  . multivitamin with minerals  1 tablet Oral Daily  . nicotine  21 mg Transdermal Daily  . pantoprazole  40 mg Oral Daily  .  QUEtiapine  25 mg Oral QHS  . rifaximin  550 mg Oral BID  . sodium chloride flush  10-40 mL Intracatheter Q12H  . sucralfate  1 g Oral TID WC & HS  . thiamine  100 mg Oral Daily    Objective: Vital signs in last 24 hours: Temp:  [99.4 F (37.4 C)-100 F (37.8 C)] 100 F (37.8 C) (03/23 0356) Pulse Rate:  [93-97] 93 (03/23 0356) Resp:  [16-18] 16 (03/23 0356) BP: (103-108)/(50-62) 108/50 (03/23 0356) SpO2:  [96 %-98 %] 98 % (03/23 0356) Constitutional:  Disheveled. anxious appearing and tearful  HENT: [place]/AT, PERRLA, no scleral icterus Mouth/Throat: Oropharynx is clear and dry . No oropharyngeal exudate.  Cardiovascular: Normal rate, regular rhythm and normal heart sounds. Pulmonary/Chest: Effort normal and breath sounds normal. No respiratory distress.  has no wheezes.  Neck = supple, no nuchal rigidity Abdominal: Soft. Bowel sounds are normal.  exhibits no distension. There is no tenderness.  Lymphadenopathy: no cervical adenopathy. No axillary adenopathy Neurological: mildly confused. Skin: Skin is warm and dry. No rash noted. No erythema.  Psychiatric: a normal mood and affect.  behavior is normal.   Lab Results Recent Labs    08/26/19 1524 08/27/19 0640  08/29/19 0535  WBC 1.8*  --  4.3  HGB 10.2*  --  10.4*  HCT 30.6*  --  31.7*  NA  --  136 140  K  --  3.7 3.4*  CL  --  108 110  CO2  --  20* 24  BUN  --  11 <5*  CREATININE  --  0.46 0.31*    Microbiology: Results for orders placed or performed during the hospital encounter of 08/22/19  Blood Culture (routine x 2)     Status: None   Collection Time: 08/22/19  7:50 AM   Specimen: BLOOD  Result Value Ref Range Status   Specimen Description BLOOD LEFT ANTECUBITAL  Final   Special Requests   Final    BOTTLES DRAWN AEROBIC AND ANAEROBIC Blood Culture adequate volume   Culture   Final    NO GROWTH 5 DAYS Performed at [facility], [address] [place], [place] [address]    Report Status 08/27/2019  FINAL  Final  Urine culture     Status: None   Collection Time: 08/22/19  7:50 AM   Specimen: In/Out Cath Urine  Result Value Ref Range Status   Specimen Description   Final    IN/OUT CATH URINE Performed at [facility], [address] [place], [place] [address]    Special Requests   Final    NONE Performed at [facility], [address], [place], [place] [address]    Culture   Final    NO GROWTH Performed at [facility], [place] [address], [place], [place] [address]    Report Status 08/23/2019 FINAL  Final  Blood Culture (routine x 2)     Status: None   Collection Time: 08/22/19  7:53 AM   Specimen: BLOOD  Result Value Ref Range Status   Specimen Description BLOOD LEFT HAND  Final   Special Requests   Final    BOTTLES DRAWN AEROBIC AND ANAEROBIC Blood Culture adequate volume   Culture   Final    NO GROWTH 5 DAYS Performed at [facility], [address] [place], [place] [address]    Report Status 08/27/2019 FINAL  Final  Respiratory Panel by RT PCR (Flu A&B, Covid) - Nasopharyngeal Swab     Status: None   Collection Time: 08/22/19 11:11 AM   Specimen: Nasopharyngeal Swab  Result Value Ref Range Status   SARS Coronavirus 2 by RT PCR NEGATIVE NEGATIVE Final    Comment: (NOTE) SARS-CoV-2 target nucleic acids are NOT DETECTED. The SARS-CoV-2 RNA is generally detectable in upper respiratoy specimens during the acute phase of infection. The lowest concentration of SARS-CoV-2 viral copies this assay can detect is 131 copies/mL. A negative result does not preclude SARS-Cov-2 infection and should not be used as the sole basis for treatment or other patient management decisions. A negative result may occur with  improper specimen collection/handling, submission of specimen other than nasopharyngeal swab, presence of viral mutation(s) within the areas targeted by this assay, and inadequate number of viral copies (<131 copies/mL). A negative  result must be combined with clinical observations, patient history, and epidemiological information. The expected result is Negative. Fact Sheet for Patients:  PinkCheek.be Fact Sheet for Healthcare Providers:  GravelBags.it This test is not yet ap proved or cleared by the [place] FDA and  has been authorized for detection and/or diagnosis of SARS-CoV-2 by FDA under an Emergency Use Authorization (EUA). This EUA will remain  in effect (  meaning this test can be used) for the duration of the COVID-19 declaration under Section 564(b)(1) of the Act, 21 U.S.C. section 360bbb-3(b)(1), unless the authorization is terminated or revoked sooner.    Influenza A by PCR NEGATIVE NEGATIVE Final   Influenza B by PCR NEGATIVE NEGATIVE Final    Comment: (NOTE) The Xpert Xpress SARS-CoV-2/FLU/RSV assay is intended as an aid in  the diagnosis of influenza from Nasopharyngeal swab specimens and  should not be used as a sole basis for treatment. Nasal washings and  aspirates are unacceptable for Xpert Xpress SARS-CoV-2/FLU/RSV  testing. Fact Sheet for Patients: PinkCheek.be Fact Sheet for Healthcare Providers: GravelBags.it This test is not yet approved or cleared by the [place] FDA and  has been authorized for detection and/or diagnosis of SARS-CoV-2 by  FDA under an Emergency Use Authorization (EUA). This EUA will remain  in effect (meaning this test can be used) for the duration of the  Covid-19 declaration under Section 564(b)(1) of the Act, 21  U.S.C. section 360bbb-3(b)(1), unless the authorization is  terminated or revoked. Performed at [facility], [address] [place], [place] [address]   CULTURE, BLOOD (ROUTINE X 2) w Reflex to ID Panel     Status: Abnormal (Preliminary result)   Collection Time: 08/26/19  5:38 AM   Specimen: BLOOD LEFT FOREARM   Result Value Ref Range Status   Specimen Description BLOOD LEFT FOREARM  Final   Special Requests   Final    BOTTLES DRAWN AEROBIC AND ANAEROBIC Blood Culture results may not be optimal due to an excessive volume of blood received in culture bottles   Culture  Setup Time (A)  Final    YEAST AEROBIC BOTTLE ONLY CRITICAL VALUE NOTED.  VALUE IS CONSISTENT WITH PREVIOUSLY REPORTED AND CALLED VALUE. Performed at [facility], [address], [place], [place] [address]    Culture YEAST (A)  Final   Report Status PENDING  Incomplete  CULTURE, BLOOD (ROUTINE X 2) w Reflex to ID Panel     Status: Abnormal (Preliminary result)   Collection Time: 08/26/19  5:38 AM   Specimen: BLOOD  Result Value Ref Range Status   Specimen Description   Final    BLOOD RIGHT MIDLINE Performed at [facility], [address], [place], [place] [address]    Special Requests   Final    BOTTLES DRAWN AEROBIC AND ANAEROBIC Blood Culture adequate volume Performed at [facility], [address] [place], [place] [address]    Culture  Setup Time   Final    YEAST IN BOTH AEROBIC AND ANAEROBIC BOTTLES CRITICAL RESULT CALLED TO, READ BACK BY AND VERIFIED WITH: MICHAEL SIMPSON AT [id number] 08/27/19.PMF Performed at [facility], [place] [address] [place],  [address]    Culture CANDIDA ALBICANS CANDIDA GLABRATA  (A)  Final   Report Status PENDING  Incomplete  Blood Culture ID Panel (Reflexed)     Status: Abnormal   Collection Time: 08/26/19  5:38 AM  Result Value Ref Range Status   Enterococcus species NOT DETECTED NOT DETECTED Final   Listeria monocytogenes NOT DETECTED NOT DETECTED Final   Staphylococcus species NOT DETECTED NOT DETECTED Final   Staphylococcus aureus (BCID) NOT DETECTED NOT DETECTED Final   Streptococcus species NOT DETECTED NOT DETECTED Final   Streptococcus agalactiae NOT DETECTED NOT DETECTED Final   Streptococcus pneumoniae NOT DETECTED NOT DETECTED Final    Streptococcus pyogenes NOT DETECTED NOT DETECTED Final   Acinetobacter baumannii  NOT DETECTED NOT DETECTED Final   Enterobacteriaceae species NOT DETECTED NOT DETECTED Final   Enterobacter cloacae complex NOT DETECTED NOT DETECTED Final   Escherichia coli NOT DETECTED NOT DETECTED Final   Klebsiella oxytoca NOT DETECTED NOT DETECTED Final   Klebsiella pneumoniae NOT DETECTED NOT DETECTED Final   Proteus species NOT DETECTED NOT DETECTED Final   Serratia marcescens NOT DETECTED NOT DETECTED Final   Haemophilus influenzae NOT DETECTED NOT DETECTED Final   Neisseria meningitidis NOT DETECTED NOT DETECTED Final   Pseudomonas aeruginosa NOT DETECTED NOT DETECTED Final   Candida albicans DETECTED (A) NOT DETECTED Final    Comment: CRITICAL RESULT CALLED TO, READ BACK BY AND VERIFIED WITH: MICHAEL SIMPSON AT [id number] 08/27/19.PMF    Candida glabrata DETECTED (A) NOT DETECTED Final    Comment: CRITICAL RESULT CALLED TO, READ BACK BY AND VERIFIED WITH: MICHAEL SIMPSON AT [id number] 08/27/19.PMF    Candida krusei NOT DETECTED NOT DETECTED Final   Candida parapsilosis NOT DETECTED NOT DETECTED Final   Candida tropicalis NOT DETECTED NOT DETECTED Final    Comment: Performed at [facility], [address] [place], [place] [address]  CULTURE, BLOOD (ROUTINE X 2) w Reflex to ID Panel     Status: None (Preliminary result)   Collection Time: 08/28/19  3:05 PM   Specimen: BLOOD  Result Value Ref Range Status   Specimen Description BLOOD BLOOD RIGHT HAND  Final   Special Requests   Final    BOTTLES DRAWN AEROBIC AND ANAEROBIC Blood Culture adequate volume   Culture   Final    NO GROWTH < 24 HOURS Performed at [facility], [address], [place], [place] [address]    Report Status PENDING  Incomplete  CULTURE, BLOOD (ROUTINE X 2) w Reflex to ID Panel     Status: None (Preliminary result)   Collection Time: 08/28/19  3:05 PM   Specimen: BLOOD  Result Value Ref Range Status    Specimen Description BLOOD RIGHT ANTECUBITAL  Final   Special Requests   Final    BOTTLES DRAWN AEROBIC AND ANAEROBIC Blood Culture adequate volume   Culture   Final    NO GROWTH < 24 HOURS Performed at [facility], [address] [place], [place] [address]    Report Status PENDING  Incomplete    Studies/Results: ECHOCARDIOGRAM COMPLETE  Result Date: 08/27/2019    ECHOCARDIOGRAM REPORT   Patient Name:   Rhonda Martin Date of Exam: 08/27/2019 Medical Rec #:  [id number]     Height:       62.0 in Accession #:    [id number]    Weight:       127.9 lb Date of Birth:  1975/07/02     BSA:          1.581 m Patient Age:    64 years      BP:           89/48 mmHg Patient Gender: F             HR:           102 bpm. Exam Location:  [facility] Procedure: 2D Echo Indications:     BACTEREMIA 790.7/R78.81  History:         Patient has prior history of Echocardiogram examinations, most                  recent 02/07/2019. Risk Factors:Current Smoker.  Sonographer:     Avanell Shackleton Referring Phys:  [place] Diagnosing Phys: Yolonda Kida MD IMPRESSIONS  1. Left ventricular ejection fraction, by estimation, is 60 to 65%. The left ventricle has normal function. The left ventricle has no regional wall motion abnormalities. Left ventricular diastolic parameters were normal.  2. Right ventricular systolic function is normal. The right ventricular size is normal. There is mildly elevated pulmonary artery systolic pressure.  3. The mitral valve is normal in structure. No evidence of mitral valve regurgitation.  4. The aortic valve is grossly normal. Aortic valve regurgitation is not visualized. FINDINGS  Left Ventricle: Left ventricular ejection fraction, by estimation, is 60 to 65%. The left ventricle has normal function. The left ventricle has no regional wall motion abnormalities. The left ventricular internal cavity size was normal in size. There is  no left ventricular hypertrophy. Left ventricular  diastolic parameters were normal. Right Ventricle: The right ventricular size is normal. No increase in right ventricular wall thickness. Right ventricular systolic function is normal. There is mildly elevated pulmonary artery systolic pressure. The tricuspid regurgitant velocity is 2.71  m/s, and with an assumed right atrial pressure of 10 mmHg, the estimated right ventricular systolic pressure is [id number] mmHg. Left Atrium: Left atrial size was normal in size. Right Atrium: Right atrial size was normal in size. Pericardium: There is no evidence of pericardial effusion. Mitral Valve: The mitral valve is normal in structure. No evidence of mitral valve regurgitation. Tricuspid Valve: The tricuspid valve is normal in structure. Tricuspid valve regurgitation is not demonstrated. Aortic Valve: The aortic valve is grossly normal. Aortic valve regurgitation is not visualized. Pulmonic Valve: The pulmonic valve was normal in structure. Pulmonic valve regurgitation is not visualized. Aorta: The aortic root is normal in size and structure. IAS/Shunts: No atrial level shunt detected by color flow Doppler.  LEFT VENTRICLE PLAX 2D LVIDd:         4.89 cm  Diastology LVIDs:         3.29 cm  LV e' lateral:   11.90 cm/s LV PW:         0.92 cm  LV E/e' lateral: 10.0 LV IVS:        0.74 cm LVOT diam:     1.80 cm LVOT Area:     2.54 cm  LEFT ATRIUM             Index       RIGHT ATRIUM           Index LA diam:        3.70 cm 2.34 cm/m  RA Area:     18.10 cm LA Vol (A2C):   76.0 ml 48.08 ml/m RA Volume:   51.30 ml  32.45 ml/m LA Vol (A4C):   60.6 ml 38.34 ml/m LA Biplane Vol: 68.5 ml 43.34 ml/m   AORTA Ao Root diam: 2.60 cm MITRAL VALVE                TRICUSPID VALVE MV Area (PHT): 6.54 cm     TR Peak grad:   29.4 mmHg MV Decel Time: 116 msec     TR Vmax:        271.00 cm/s MV E velocity: 119.00 cm/s MV A velocity: 123.00 cm/s  SHUNTS MV E/A ratio:  0.97         Systemic Diam: 1.80 cm Dwayne Prince Rome MD Electronically signed by  Yolonda Kida MD Signature Date/Time: 08/27/2019/11:47:50 PM    Final     Assessment/Plan: Rhonda Martin  is a 44 y.o. female with MMP including cirrhosis, TCP, COPD admit with AMS. She initially was AF with wbc 5, admit bcx neg. Midline placed and the next day had high fever and BCX + C glabrata and albicans. PICC removed, started eraxis. TTE neg. Also has pancytopenia.  Midline removed 3/21 and clinically stable. I doubt she had candidemia on admit -, likely line related and related to her neutropenia  3/23 Tmax 100. FU bcx ngtd  Recommendations Cont eraxis. Will need 14 days of treatment. If sensitive to fluc can be changed to oral once sensitivities back  Will send sensitivities on the Candida glabrata. Given acute candidemia and likely midline source and TCP will defer TEE as TTE negative.  Thank you very much for allowing me to participate in the care of this patient. Please call with questions  Leonel Ramsay   08/29/2019, 2:15 PM

## 2019-08-29 NOTE — Plan of Care (Signed)
Patient refused telemetry monitoring, patient states having leads on increases anxiety.  Anxiety meds given as prescribed. Will continue to montior.

## 2019-08-30 ENCOUNTER — Inpatient Hospital Stay: Payer: Self-pay

## 2019-08-30 HISTORY — PX: PICC LINE INSERTION: CATH118290

## 2019-08-30 LAB — GLUCOSE, CAPILLARY: Glucose-Capillary: 112 mg/dL — ABNORMAL HIGH (ref 70–99)

## 2019-08-30 MED ORDER — CHLORHEXIDINE GLUCONATE CLOTH 2 % EX PADS
6.0000 | MEDICATED_PAD | Freq: Every day | CUTANEOUS | Status: DC
  Administered 2019-08-30: 6 via TOPICAL

## 2019-08-30 MED ORDER — SODIUM CHLORIDE 0.9% FLUSH: 10.0000 mL | INTRAVENOUS | Status: DC | PRN

## 2019-08-30 NOTE — Progress Notes (Signed)
Infectious Disease Long Term IV Antibiotic Orders Rhonda Martin Aug 31, 1975  Diagnosis: Line associated candidemia  Culture results C glabrata and albicans  LABS Lab Results  Component Value Date   CREATININE 0.31 (L) 08/29/2019   Lab Results  Component Value Date   WBC 4.3 08/29/2019   HGB 10.4 (L) 08/29/2019   HCT 31.7 (L) 08/29/2019   MCV 85.4 08/29/2019   PLT 88 (L) 08/29/2019   No results found for: ESRSEDRATE, POCTSEDRATE No results found for: CRP  Allergies: No Known Allergies  Discharge antibiotics Caspofungin 50 mg q 24 hours    PICC Care per protocol Labs weekly while on IV antibiotics -FAX weekly labs to [phone] CBC w diff   Comprehensive met panel   Planned duration of antibiotics 2 weeks from neg culture  Stop date 4/4  Follow up clinic date - no need for ID fu May remove picc after last dose   Leonel Ramsay, MD

## 2019-08-30 NOTE — Progress Notes (Signed)
[facility] INF[facility] PROGRESS NOTE Date of Admission:  08/22/2019     ID: Rhonda Martin is a 44 y.o. female with candidemia Principal Problem:   Hallucination Active Problems:   Alcohol abuse   Tobacco abuse   Acute pain of right knee   IDA (iron deficiency anemia)   Anxiety   COPD (chronic obstructive pulmonary disease) (HCC)   Liver cirrhosis (HCC)   HTN (hypertension)   Acute respiratory failure with hypoxia (HCC)   Acute metabolic encephalopathy   Acute hepatic encephalopathy   Hepatic encephalopathy (HCC)   Hypotension   Lobar pneumonia (HCC)   Neutropenic fever (HCC)   Subjective: Reports feeling much better today. Up and walking today. Denies abd pain, nvd. No fevers, chills  ROS  Eleven systems are reviewed and negative except per hpi  Medications:  Antibiotics Given (last 72 hours)    Date/Time Action Medication Dose   08/27/19 2133 Given   rifaximin (XIFAXAN) tablet 550 mg 550 mg   08/28/19 0918 Given   rifaximin (XIFAXAN) tablet 550 mg 550 mg   08/28/19 2042 Given   rifaximin (XIFAXAN) tablet 550 mg 550 mg   08/29/19 0909 Given   rifaximin (XIFAXAN) tablet 550 mg 550 mg   08/29/19 2221 Given   rifaximin (XIFAXAN) tablet 550 mg 550 mg   08/30/19 0959 Given   rifaximin (XIFAXAN) tablet 550 mg 550 mg     . ferrous sulfate  325 mg Oral BID  . folic acid  1 mg Oral Daily  . mometasone-formoterol  2 puff Inhalation BID  . multivitamin with minerals  1 tablet Oral Daily  . nicotine  21 mg Transdermal Daily  . pantoprazole  40 mg Oral Daily  . QUEtiapine  25 mg Oral QHS  . rifaximin  550 mg Oral BID  . sodium chloride flush  10-40 mL Intracatheter Q12H  . sucralfate  1 g Oral TID WC & HS  . thiamine  100 mg Oral Daily    Objective: Vital signs in last 24 hours: Temp:  [99 F (37.2 C)] 99 F (37.2 C) (03/23 1924) Pulse Rate:  [75-90] 90 (03/24 0746) Resp:  [17] 17 (03/23 1924) BP: (104)/(61) 104/61 (03/23 1924) SpO2:  [97 %] 97 %  (03/23 1924) Constitutional:  Disheveled. anxious appearing and tearful  HENT: [place]/AT, PERRLA, no scleral icterus Mouth/Throat: Oropharynx is clear and dry . No oropharyngeal exudate.  Cardiovascular: Normal rate, regular rhythm and normal heart sounds. Pulmonary/Chest: Effort normal and breath sounds normal. No respiratory distress.  has no wheezes.  Neck = supple, no nuchal rigidity Abdominal: Soft. Bowel sounds are normal.  exhibits no distension. There is no tenderness.  Lymphadenopathy: no cervical adenopathy. No axillary adenopathy Neurological: mildly confused. Skin: Skin is warm and dry. No rash noted. No erythema.  Psychiatric: a normal mood and affect.  behavior is normal.   Lab Results Recent Labs    08/29/19 0535  WBC 4.3  HGB 10.4*  HCT 31.7*  NA 140  K 3.4*  CL 110  CO2 24  BUN <5*  CREATININE 0.31*    Microbiology: Results for orders placed or performed during the hospital encounter of 08/22/19  Blood Culture (routine x 2)     Status: None   Collection Time: 08/22/19  7:50 AM   Specimen: BLOOD  Result Value Ref Range Status   Specimen Description BLOOD LEFT ANTECUBITAL  Final   Special Requests   Final    BOTTLES DRAWN AEROBIC AND ANAEROBIC Blood  Culture adequate volume   Culture   Final    NO GROWTH 5 DAYS Performed at [facility], [address] [place], [place] [address]    Report Status 08/27/2019 FINAL  Final  Urine culture     Status: None   Collection Time: 08/22/19  7:50 AM   Specimen: In/Out Cath Urine  Result Value Ref Range Status   Specimen Description   Final    IN/OUT CATH URINE Performed at [facility], [address] [place], [place] [address]    Special Requests   Final    NONE Performed at [facility], [address] [place], [place] [address]    Culture   Final    NO GROWTH Performed at [facility], [place] [address] [place], [place] [address]    Report Status 08/23/2019 FINAL  Final   Blood Culture (routine x 2)     Status: None   Collection Time: 08/22/19  7:53 AM   Specimen: BLOOD  Result Value Ref Range Status   Specimen Description BLOOD LEFT HAND  Final   Special Requests   Final    BOTTLES DRAWN AEROBIC AND ANAEROBIC Blood Culture adequate volume   Culture   Final    NO GROWTH 5 DAYS Performed at [facility], [address] [place], [place] [address]    Report Status 08/27/2019 FINAL  Final  Respiratory Panel by RT PCR (Flu A&B, Covid) - Nasopharyngeal Swab     Status: None   Collection Time: 08/22/19 11:11 AM   Specimen: Nasopharyngeal Swab  Result Value Ref Range Status   SARS Coronavirus 2 by RT PCR NEGATIVE NEGATIVE Final    Comment: (NOTE) SARS-CoV-2 target nucleic acids are NOT DETECTED. The SARS-CoV-2 RNA is generally detectable in upper respiratoy specimens during the acute phase of infection. The lowest concentration of SARS-CoV-2 viral copies this assay can detect is 131 copies/mL. A negative result does not preclude SARS-Cov-2 infection and should not be used as the sole basis for treatment or other patient management decisions. A negative result may occur with  improper specimen collection/handling, submission of specimen other than nasopharyngeal swab, presence of viral mutation(s) within the areas targeted by this assay, and inadequate number of viral copies (<131 copies/mL). A negative result must be combined with clinical observations, patient history, and epidemiological information. The expected result is Negative. Fact Sheet for Patients:  PinkCheek.be Fact Sheet for Healthcare Providers:  GravelBags.it This test is not yet ap proved or cleared by the [place] FDA and  has been authorized for detection and/or diagnosis of SARS-CoV-2 by FDA under an Emergency Use Authorization (EUA). This EUA will remain  in effect (meaning this test can be used) for the  duration of the COVID-19 declaration under Section 564(b)(1) of the Act, 21 U.S.C. section 360bbb-3(b)(1), unless the authorization is terminated or revoked sooner.    Influenza A by PCR NEGATIVE NEGATIVE Final   Influenza B by PCR NEGATIVE NEGATIVE Final    Comment: (NOTE) The Xpert Xpress SARS-CoV-2/FLU/RSV assay is intended as an aid in  the diagnosis of influenza from Nasopharyngeal swab specimens and  should not be used as a sole basis for treatment. Nasal washings and  aspirates are unacceptable for Xpert Xpress SARS-CoV-2/FLU/RSV  testing. Fact Sheet for Patients: PinkCheek.be Fact Sheet for Healthcare Providers: GravelBags.it This test is not yet approved or cleared by the [place] FDA and  has been authorized for detection and/or diagnosis of SARS-CoV-2 by  FDA under an Emergency Use Authorization (EUA). This EUA will remain  in effect (meaning this test can be used) for the duration of the  Covid-19 declaration under Section 564(b)(1) of the Act, 21  U.S.C. section 360bbb-3(b)(1), unless the authorization is  terminated or revoked. Performed at [facility], [address] [place], [place] [address]   CULTURE, BLOOD (ROUTINE X 2) w Reflex to ID Panel     Status: Abnormal (Preliminary result)   Collection Time: 08/26/19  5:38 AM   Specimen: BLOOD LEFT FOREARM  Result Value Ref Range Status   Specimen Description BLOOD LEFT FOREARM  Final   Special Requests   Final    BOTTLES DRAWN AEROBIC AND ANAEROBIC Blood Culture results may not be optimal due to an excessive volume of blood received in culture bottles   Culture  Setup Time (A)  Final    YEAST AEROBIC BOTTLE ONLY CRITICAL VALUE NOTED.  VALUE IS CONSISTENT WITH PREVIOUSLY REPORTED AND CALLED VALUE. Performed at [facility], [address], [place], [place] [address]    Culture YEAST (A)  Final   Report Status PENDING  Incomplete   CULTURE, BLOOD (ROUTINE X 2) w Reflex to ID Panel     Status: Abnormal (Preliminary result)   Collection Time: 08/26/19  5:38 AM   Specimen: BLOOD  Result Value Ref Range Status   Specimen Description   Final    BLOOD RIGHT MIDLINE Performed at [facility], [address] [place], [place] [address]    Special Requests   Final    BOTTLES DRAWN AEROBIC AND ANAEROBIC Blood Culture adequate volume Performed at [facility], [address] [place], [place] [address]    Culture  Setup Time   Final    YEAST IN BOTH AEROBIC AND ANAEROBIC BOTTLES CRITICAL RESULT CALLED TO, READ BACK BY AND VERIFIED WITH: MICHAEL SIMPSON AT [id number] 08/27/19.PMF Performed at [facility], [place] [address] [place], [place] [address]    Culture CANDIDA ALBICANS CANDIDA GLABRATA  (A)  Final   Report Status PENDING  Incomplete  Blood Culture ID Panel (Reflexed)     Status: Abnormal   Collection Time: 08/26/19  5:38 AM  Result Value Ref Range Status   Enterococcus species NOT DETECTED NOT DETECTED Final   Listeria monocytogenes NOT DETECTED NOT DETECTED Final   Staphylococcus species NOT DETECTED NOT DETECTED Final   Staphylococcus aureus (BCID) NOT DETECTED NOT DETECTED Final   Streptococcus species NOT DETECTED NOT DETECTED Final   Streptococcus agalactiae NOT DETECTED NOT DETECTED Final   Streptococcus pneumoniae NOT DETECTED NOT DETECTED Final   Streptococcus pyogenes NOT DETECTED NOT DETECTED Final   Acinetobacter baumannii NOT DETECTED NOT DETECTED Final   Enterobacteriaceae species NOT DETECTED NOT DETECTED Final   Enterobacter cloacae complex NOT DETECTED NOT DETECTED Final   Escherichia coli NOT DETECTED NOT DETECTED Final   Klebsiella oxytoca NOT DETECTED NOT DETECTED Final   Klebsiella pneumoniae NOT DETECTED NOT DETECTED Final   Proteus species NOT DETECTED NOT DETECTED Final   Serratia marcescens NOT DETECTED NOT DETECTED Final   Haemophilus influenzae NOT DETECTED NOT  DETECTED Final   Neisseria meningitidis NOT DETECTED NOT DETECTED Final   Pseudomonas aeruginosa NOT DETECTED NOT DETECTED Final   Candida albicans DETECTED (A) NOT DETECTED Final    Comment: CRITICAL RESULT CALLED TO, READ BACK BY AND VERIFIED WITH: MICHAEL SIMPSON AT [id number] 08/27/19.PMF    Candida glabrata DETECTED (A) NOT DETECTED Final  Comment: CRITICAL RESULT CALLED TO, READ BACK BY AND VERIFIED WITH: MICHAEL SIMPSON AT [id number] 08/27/19.PMF    Candida krusei NOT DETECTED NOT DETECTED Final   Candida parapsilosis NOT DETECTED NOT DETECTED Final   Candida tropicalis NOT DETECTED NOT DETECTED Final    Comment: Performed at [facility], [address] [place], [place] [address]  CULTURE, BLOOD (ROUTINE X 2) w Reflex to ID Panel     Status: None (Preliminary result)   Collection Time: 08/28/19  3:05 PM   Specimen: BLOOD  Result Value Ref Range Status   Specimen Description BLOOD BLOOD RIGHT HAND  Final   Special Requests   Final    BOTTLES DRAWN AEROBIC AND ANAEROBIC Blood Culture adequate volume   Culture   Final    NO GROWTH 2 DAYS Performed at [facility], [address], [place], [place] [address]    Report Status PENDING  Incomplete  CULTURE, BLOOD (ROUTINE X 2) w Reflex to ID Panel     Status: None (Preliminary result)   Collection Time: 08/28/19  3:05 PM   Specimen: BLOOD  Result Value Ref Range Status   Specimen Description BLOOD RIGHT ANTECUBITAL  Final   Special Requests   Final    BOTTLES DRAWN AEROBIC AND ANAEROBIC Blood Culture adequate volume   Culture   Final    NO GROWTH 2 DAYS Performed at [facility], [address] [place], [place] [address]    Report Status PENDING  Incomplete    Studies/Results: No results found.  Assessment/Plan: SANVIKA FILIPPELLI is a 44 y.o. female with MMP including cirrhosis, TCP, COPD admit with AMS. She initially was AF with wbc 5, admit bcx neg. Midline placed and the next day had high fever and BCX  + C glabrata and albicans. Midline removed, started eraxis. TTE neg. Also has pancytopenia.  Midline removed 3/21 and clinically stable. I doubt she had candidemia on admit -, likely line related and related to her neutropenia  3/23 Tmax 100. FU bcx ngtd 3/24 - no fevers. Feels much better Fu bcx 3/22 NGTD  Recommendations Cont eraxis. Will need 14 days of treatment. Can place picc line if getting ready for dc.  Pending  sensitivities on the Candida glabrata.  If sensitive to fluc can be changed to oral once sensitivities back  Given acute candidemia and likely midline source and TCP will defer TEE as TTE negative.  Thank you very much for allowing me to participate in the care of this patient. Please call with questions  Leonel Ramsay   08/30/2019, 12:55 PM

## 2019-08-30 NOTE — Progress Notes (Signed)
Peripherally Inserted Central Catheter Placement  The IV Nurse has discussed with the patient and/or persons authorized to consent for the patient, the purpose of this procedure and the potential benefits and risks involved with this procedure.  The benefits include less needle sticks, lab draws from the catheter, and the patient may be discharged home with the catheter. Risks include, but not limited to, infection, bleeding, blood clot (thrombus formation), and puncture of an artery; nerve damage and irregular heartbeat and possibility to perform a PICC exchange if needed/ordered by physician.  Alternatives to this procedure were also discussed.  Bard Power PICC patient education guide, fact sheet on infection prevention and patient information card has been provided to patient /or left at bedside.    PICC Placement Documentation  PICC Single Lumen 08/30/19 PICC Right Brachial 37 cm 0 cm (Active)  Indication for Insertion or Continuance of Line Prolonged intravenous therapies 08/30/19 1730  Exposed Catheter (cm) 0 cm 08/30/19 1730  Site Assessment Clean;Dry;Intact 08/30/19 1730  Line Status Flushed;Saline locked;Blood return noted 08/30/19 1730  Dressing Type Transparent;Securing device 08/30/19 1730  Dressing Status Clean;Dry;Intact;Antimicrobial disc in place 08/30/19 1730  Dressing Intervention New dressing 08/30/19 1730  Dressing Change Due 09/06/19 08/30/19 1730       Enos Fling 08/30/2019, 5:54 PM
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
## 2019-08-30 NOTE — Progress Notes (Signed)
PICC line in place. Patient refused to allow me to remove her saline lock. She said she is worried something will happen to the PICC and she will have to be stuck .

## 2019-08-30 NOTE — Progress Notes (Signed)
PHARMACY CONSULT NOTE FOR:  OUTPATIENT  PARENTERAL ANTIBIOTIC THERAPY (OPAT)  Indication: C. Glabrata and albicans blood stream infection Regimen: caspofungin 50mg  IV q24h End date: 09/10/2019  IV antibiotic discharge orders are pended. To discharging provider:  please sign these orders via discharge navigator,  Select New Orders & click on the button choice - Manage This Unsigned Work.     Thank you for allowing pharmacy to be a part of this patient's care.  Doreene Eland, PharmD, BCPS.   Work Cell: [phone] 08/30/2019 4:25 PM

## 2019-08-30 NOTE — Progress Notes (Signed)
Physical Therapy Treatment Patient Details Name: [name] MRN: [id number] DOB: July 25, 1975 Today's Date: 08/30/2019    History of Present Illness Rhonda Martin is a 47yoF who comes to [facility] on 3/16 c severe pain in knee and ankle. Pt has Charcot knee and is s/p ankle fushion. Pt also having hallucinations upon arrival. Noted knee swelling, orthopedics following. PMH: Liver cirrhosis, ETOH abuse, Charcot knee, s/p Ankle fusion, hypertension, COPD, GERD, anxiety, iron deficiency anemia, cervical cancer, tobacco abuse, thrombocytopenia    PT Comments    KI remains missing in room.  Discussed with RN who obtained new brace for her.  It was fitted and donned prior to gait.  Bed mobility without assist.  She stands quickly and does not wait for gait belt to be donned, walker to be placed in reach or IV to be managed.  Asked pt to sit but she remained standing "I'm ok."  She is able to walk out of room but poor navigation around objects as she did not wait for path to be cleared, pushing them out of the way generally unsafely.  Once in hallway, gait improved and she is able to complete lap around nursing unit with min guard, RW and KI.  She stated KI does help with pain and stability.  She wanted to keep in on upon getting back to bed.  Stated she uses SPC at home but has walker and wheelchair if needed.  Encouraged her to use RW at all times for general safety and stability.  She voiced understanding but I am unsure if she will actually use it.    Of note, during gait she states there is a white cat staying with her in her room and it must have slipped out while we left the door open to walk.  She begins calling him as soon as we enter the room and stated she will "miss him" now that he is gone.     Follow Up Recommendations  Home health PT;Supervision for mobility/OOB     Equipment Recommendations  Other (comment)(has equipement needed.  encouarged to use RW)    Recommendations for Other Services        Precautions / Restrictions Precautions Precautions: Fall Required Braces or Orthoses: Knee Immobilizer - Right Knee Immobilizer - Right: On when out of bed or walking Restrictions Weight Bearing Restrictions: No    Mobility  Bed Mobility Overal bed mobility: Modified Independent                Transfers Overall transfer level: Needs assistance Equipment used: Rolling walker (2 wheeled);None Transfers: Sit to/from Stand Sit to Stand: Supervision;Min guard            Ambulation/Gait Ambulation/Gait assistance: Min guard Gait Distance (Feet): 200 Feet Assistive device: Rolling walker (2 wheeled) Gait Pattern/deviations: Step-through pattern;Step-to pattern;Decreased step length - right;Decreased step length - left;Ataxic Gait velocity: decreased   General Gait Details: impulsive in room around objects but does well in hallway, stated KI helps with pain   Stairs             Wheelchair Mobility    Modified Rankin (Stroke Patients Only)       Balance Overall balance assessment: History of Falls;Needs assistance Sitting-balance support: Feet supported Sitting balance-Leahy Scale: Good     Standing balance support: Bilateral upper extremity supported;During functional activity Standing balance-Leahy Scale: Fair Standing balance comment: no LOB or near falls today but remains impulive which affects balance.  Cognition Arousal/Alertness: Awake/alert Behavior During Therapy: Impulsive;Anxious;Restless Overall Cognitive Status: Within Functional Limits for tasks assessed                                 General Comments: remains impulsive but seems more orientated today and speach less slurred compared to last session.  She does think a white cat has been in her room and must have snuck out and begins calling for him upon return to room.      Exercises      General Comments         Pertinent Vitals/Pain Pain Assessment: Faces Faces Pain Scale: Hurts a little bit Pain Location: Knee - L Pain Descriptors / Indicators: Sore;Aching Pain Intervention(s): Limited activity within patient's tolerance;Monitored during session    Home Living                      Prior Function            PT Goals (current goals can now be found in the care plan section) Progress towards PT goals: Progressing toward goals    Frequency    Min 2X/week      PT Plan Current plan remains appropriate    Co-evaluation              AM-PAC PT "6 Clicks" Mobility   Outcome Measure  Help needed turning from your back to your side while in a flat bed without using bedrails?: None Help needed moving from lying on your back to sitting on the side of a flat bed without using bedrails?: A Little Help needed moving to and from a bed to a chair (including a wheelchair)?: A Little Help needed standing up from a chair using your arms (e.g., wheelchair or bedside chair)?: A Little Help needed to walk in hospital room?: A Little Help needed climbing 3-5 steps with a railing? : A Little 6 Click Score: 19    End of Session Equipment Utilized During Treatment: Gait belt Activity Tolerance: Patient tolerated treatment well Patient left: in bed;with bed alarm set;with call bell/phone within reach Nurse Communication: Mobility status       Time: [id number] PT Time Calculation (min) (ACUTE ONLY): 17 min  Charges:  $Gait Training: 8-22 mins                    [name], PTA 08/30/19, 11:38 AM

## 2019-08-31 ENCOUNTER — Other Ambulatory Visit: Payer: Self-pay | Admitting: Orthopedic Surgery

## 2019-08-31 LAB — BASIC METABOLIC PANEL
Anion gap: 6 (ref 5–15)
BUN: 6 mg/dL (ref 6–20)
CO2: 25 mmol/L (ref 22–32)
Calcium: 8.6 mg/dL — ABNORMAL LOW (ref 8.9–10.3)
Chloride: 108 mmol/L (ref 98–111)
Creatinine, Ser: 0.41 mg/dL — ABNORMAL LOW (ref 0.44–1.00)
GFR calc Af Amer: >60 mL/min (ref >60)
GFR calc non Af Amer: >60 mL/min (ref >60)
Glucose, Bld: 105 mg/dL — ABNORMAL HIGH (ref 70–99)
Potassium: 3.7 mmol/L (ref 3.5–5.1)
Sodium: 139 mmol/L (ref 135–145)

## 2019-08-31 LAB — CBC WITH DIFFERENTIAL/PLATELET
Abs Immature Granulocytes: 0.01 10*3/uL (ref 0.00–0.07)
Basophils Absolute: 0.1 10*3/uL (ref 0.0–0.1)
Basophils Relative: 1 %
Eosinophils Absolute: 0.1 10*3/uL (ref 0.0–0.5)
Eosinophils Relative: 2 %
HCT: 32.8 % — ABNORMAL LOW (ref 36.0–46.0)
Hemoglobin: 10.8 g/dL — ABNORMAL LOW (ref 12.0–15.0)
Immature Granulocytes: 0 %
Lymphocytes Relative: 22 %
Lymphs Abs: 1 10*3/uL (ref 0.7–4.0)
MCH: 27.8 pg (ref 26.0–34.0)
MCHC: 32.9 g/dL (ref 30.0–36.0)
MCV: 84.3 fL (ref 80.0–100.0)
Monocytes Absolute: 0.8 10*3/uL (ref 0.1–1.0)
Monocytes Relative: 17 %
Neutro Abs: 2.7 10*3/uL (ref 1.7–7.7)
Neutrophils Relative %: 58 %
Platelets: 107 10*3/uL — ABNORMAL LOW (ref 150–400)
RBC: 3.89 MIL/uL (ref 3.87–5.11)
RDW: 25.7 % — ABNORMAL HIGH (ref 11.5–15.5)
WBC: 4.6 10*3/uL (ref 4.0–10.5)
nRBC: 0 % (ref 0.0–0.2)

## 2019-08-31 LAB — CULTURE, BLOOD (ROUTINE X 2)

## 2019-08-31 LAB — GLUCOSE, CAPILLARY: Glucose-Capillary: 116 mg/dL — ABNORMAL HIGH (ref 70–99)

## 2019-08-31 MED ORDER — HYDROCERIN EX CREA
TOPICAL_CREAM | Freq: Two times a day (BID) | CUTANEOUS | Status: DC
  Filled 2019-08-31: qty 113

## 2019-08-31 NOTE — TOC Progression Note (Addendum)
Transition of Care [facility]) - Progression Note    Patient Details  Name: MALETA LERMA MRN: [id number] Date of Birth: 20-Nov-1975  Transition of Care [facility]) CM/SW [place], LCSW Phone Number: 08/31/2019, 9:28 AM  Clinical Narrative: Patient will discharge on IV Caspofungin until 4/4. Carolynn Sayers with [facility] is already aware. [facility] unable to accept patient now. [facility], [facility], and [facility] declined. Left messages for [facility], [facility], and [facility]. Pam will reach out to [facility] to go ahead and plan on them providing her nursing care in case no one can take her due to her Medicaid.   10:27 am: [facility], [facility], [facility], and [facility] unable to accept. [facility] does not take Medicaid. No answer from [facility] or [facility] yet.    1:46 pm: [facility] declined. Discussed case with MD. Will give patient information on outpatient PT if she is interested.  2:10 pm: Updated patient. Gave outpatient PT list. MD aware.  Expected Discharge Plan: [place] Barriers to Discharge: Continued Medical Work up  Expected Discharge Plan and Services Expected Discharge Plan: [place] Choice: [place] arrangements for the past 2 months: Single Family Home                                       Social Determinants of Health (SDOH) Interventions    Readmission Risk Interventions Readmission Risk Prevention Plan 01/21/2019  Post Dischage Appt Complete  Medication Screening Complete  Transportation Screening Complete
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
## 2019-09-01 ENCOUNTER — Other Ambulatory Visit: Payer: Self-pay | Admitting: Family Medicine

## 2019-09-01 DIAGNOSIS — Z79899 Other long term (current) drug therapy: Secondary | ICD-10-CM | POA: Diagnosis not present

## 2019-09-01 LAB — CBC WITH DIFFERENTIAL/PLATELET
Abs Immature Granulocytes: 0.01 10*3/uL (ref 0.00–0.07)
Basophils Absolute: 0.1 10*3/uL (ref 0.0–0.1)
Basophils Relative: 1 %
Eosinophils Absolute: 0.1 10*3/uL (ref 0.0–0.5)
Eosinophils Relative: 1 %
HCT: 33.3 % — ABNORMAL LOW (ref 36.0–46.0)
Hemoglobin: 10.8 g/dL — ABNORMAL LOW (ref 12.0–15.0)
Immature Granulocytes: 0 %
Lymphocytes Relative: 20 %
Lymphs Abs: 1.1 10*3/uL (ref 0.7–4.0)
MCH: 28 pg (ref 26.0–34.0)
MCHC: 32.4 g/dL (ref 30.0–36.0)
MCV: 86.3 fL (ref 80.0–100.0)
Monocytes Absolute: 0.9 10*3/uL (ref 0.1–1.0)
Monocytes Relative: 17 %
Neutro Abs: 3.2 10*3/uL (ref 1.7–7.7)
Neutrophils Relative %: 61 %
Platelets: 113 10*3/uL — ABNORMAL LOW (ref 150–400)
RBC: 3.86 MIL/uL — ABNORMAL LOW (ref 3.87–5.11)
RDW: 25.3 % — ABNORMAL HIGH (ref 11.5–15.5)
Smear Review: NORMAL
WBC: 5.3 10*3/uL (ref 4.0–10.5)
nRBC: 0 % (ref 0.0–0.2)

## 2019-09-01 LAB — BASIC METABOLIC PANEL
Anion gap: 7 (ref 5–15)
BUN: 5 mg/dL — ABNORMAL LOW (ref 6–20)
CO2: 27 mmol/L (ref 22–32)
Calcium: 8.8 mg/dL — ABNORMAL LOW (ref 8.9–10.3)
Chloride: 105 mmol/L (ref 98–111)
Creatinine, Ser: 0.5 mg/dL (ref 0.44–1.00)
GFR calc Af Amer: >60 mL/min (ref >60)
GFR calc non Af Amer: >60 mL/min (ref >60)
Glucose, Bld: 122 mg/dL — ABNORMAL HIGH (ref 70–99)
Potassium: 3.7 mmol/L (ref 3.5–5.1)
Sodium: 139 mmol/L (ref 135–145)

## 2019-09-01 LAB — GLUCOSE, CAPILLARY: Glucose-Capillary: 89 mg/dL (ref 70–99)

## 2019-09-01 MED ORDER — CASPOFUNGIN IV (FOR PTA / DISCHARGE USE ONLY): 50.0000 mg | INTRAVENOUS | 0 refills | Status: AC

## 2019-09-01 MED ORDER — RIFAXIMIN 550 MG PO TABS: 550.0000 mg | ORAL_TABLET | Freq: Two times a day (BID) | ORAL | 0 refills | Status: AC

## 2019-09-01 MED ORDER — NICOTINE 21 MG/24HR TD PT24: 21.0000 mg | MEDICATED_PATCH | Freq: Every day | TRANSDERMAL | 0 refills | Status: DC

## 2019-09-01 NOTE — Progress Notes (Signed)
Rhonda Martin to be D/C'd Home per MD order.  Discussed prescriptions and follow up appointments with the patient. Prescriptions given to patient, medication list explained in detail. Pt verbalized understanding.  Allergies as of 09/01/2019   No Known Allergies     Medication List    TAKE these medications   Blood Pressure Monitor Misc For automatic blood pressure cuff.   caspofungin  IVPB Commonly known as: CANCIDAS Inject 50 mg into the vein daily for 10 days. Indication: Candida glabrata and albicans blood stream infection Last Day of Therapy: 09/10/2019 Labs - Once weekly:  CBC/D and CMP   ferrous sulfate 325 (65 FE) MG EC tablet Take 1 tablet (325 mg total) by mouth 2 (two) times daily.   gabapentin 300 MG capsule Commonly known as: NEURONTIN TAKE 1-3 CAPSULES BY MOUTH DAILY AS NEEDED   hydrOXYzine 25 MG tablet Commonly known as: ATARAX/VISTARIL Take 1 tablet (25 mg total) by mouth 3 (three) times daily as needed. What changed: reasons to take this   metoprolol succinate 25 MG 24 hr tablet Commonly known as: TOPROL-XL TAKE 1 TABLET BY MOUTH EVERY DAY   multivitamin with minerals Tabs tablet Take 1 tablet by mouth daily.   nicotine 21 mg/24hr patch Commonly known as: NICODERM CQ - dosed in mg/24 hours Place 1 patch (21 mg total) onto the skin daily.   oxyCODONE 5 MG immediate release tablet Commonly known as: Oxy IR/ROXICODONE Take 5 mg by mouth 5 (five) times daily as needed for moderate pain or severe pain.   pantoprazole 40 MG tablet Commonly known as: PROTONIX Take 1 tablet (40 mg total) by mouth daily.   potassium chloride SA 20 MEQ tablet Commonly known as: Klor-Con M20 TAKE 1 TABLET (20 MEQ TOTAL) BY MOUTH 3 (THREE) TIMES DAILY.   rifaximin 550 MG Tabs tablet Commonly known as: XIFAXAN Take 1 tablet (550 mg total) by mouth 2 (two) times daily for 20 days.   spironolactone 25 MG tablet Commonly known as: Aldactone Take 1 tablet (25 mg total) by mouth  daily.   sucralfate 1 g tablet Commonly known as: Carafate Take 1 tablet (1 g total) by mouth 4 (four) times daily -  with meals and at bedtime.   triamcinolone cream 0.1 % Commonly known as: KENALOG Apply 1 application topically 2 (two) times daily. What changed:   when to take this  reasons to take this   zolpidem 5 MG tablet Commonly known as: AMBIEN Take 1 tablet (5 mg total) by mouth at bedtime as needed for sleep.            Home Infusion Instuctions  (From admission, onward)         Start     Ordered   09/01/19 0000  Home infusion instructions    Question:  Instructions  Answer:  Flushing of vascular access device: 0.9% NaCl pre/post medication administration and prn patency; Heparin 100 u/ml, 11ml for implanted ports and Heparin 10u/ml, 67ml for all other central venous catheters.   09/01/19 0837          Vitals:   09/01/19 0648 09/01/19 0845  BP: (!) 94/58 101/62  Pulse: 85 91  Resp:  20  Temp: 99.6 F (37.6 C) 98.7 F (37.1 C)  SpO2: 97% 95%    Skin clean, dry and intact without evidence of skin break down, no evidence of skin tears noted. PIV catheter discontinued intact. PICC left in place for IV home antibiotic administration. Site without signs  and symptoms of complications. Dressing and pressure applied. Pt denies pain at this time. No complaints noted.  An After Visit Summary was printed and given to the patient. Patient escorted via [place], and D/C home via private auto.  Rhonda Mandril, RN

## 2019-09-01 NOTE — TOC Transition Note (Signed)
Transition of Care [facility]) - CM/SW Discharge Note   Patient Details  Name: Rhonda Martin MRN: [id number] Date of Birth: 1976-01-18  Transition of Care [facility]) CM/SW Contact:  Candie Chroman, LCSW Phone Number: 09/01/2019, 8:56 AM   Clinical Narrative:  Patient has orders to discharge home today. [facility] representative is aware. Her dose will be at the hospital around 10:00 this morning. Nurse from [facility] will be at patient's house at 2:00. Patient said yesterday that her husband would pick her up today. If that falls through, will give her a cab voucher. She told CSW yesterday that she does have a key to get into her house. No further concerns. CSW signing off.  Final next level of care: Home w Home Health Services Barriers to Discharge: Barriers Resolved   Patient Goals and CMS Choice   CMS Medicare.gov Compare Post Acute Care list provided to:: Patient Choice offered to / list presented to : NA  Discharge Placement                    Patient and family notified of of transfer: 09/01/19  Discharge Plan and Services     Post Acute Care Choice: [place] Arranged: IV Antibiotics, RN [facility] Agency: Other - See comment([facility]/[place]) Date HH Agency Contacted: 09/01/19   Representative spoke with at [place]: Carolynn Sayers  Social Determinants of Health ([place]) Interventions     Readmission Risk Interventions Readmission Risk Prevention Plan 01/21/2019  Post Dischage Appt Complete  Medication Screening Complete  Transportation Screening Complete

## 2019-09-01 NOTE — Discharge Summary (Signed)
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 research purposes only by the assay's manufacturer.  The performance characteristics of this product have not been established.  Results should not be used as a diagnostic procedure without confirmation of the diagnosis by another medically established diagnostic product or procedure. Performed At: [facility] [place], [place] [id number] Rush Farmer MD [id number]    Fluconazole Islt MIC PENDING  Incomplete   Flucytosine MIC PENDING  Incomplete   Itraconazole MIC PENDING  Incomplete   Voriconazole MIC PENDING  Incomplete   Source   Final    ORG 2 CANDIDA GLABRATA BLOOD CULTURE SUSCEPTIBILITY    Comment: Performed at [facility], [place] [address] [place], [place] [address]  CULTURE, BLOOD (ROUTINE X 2) w Reflex to ID  Panel     Status: None (Preliminary result)   Collection Time: 08/28/19  3:05 PM   Specimen: BLOOD  Result Value Ref Range Status   Specimen Description BLOOD BLOOD RIGHT HAND  Final   Special Requests   Final    BOTTLES DRAWN AEROBIC AND ANAEROBIC Blood Culture adequate volume   Culture   Final    NO GROWTH 4 DAYS Performed at [facility], [address], [place], [place] [address]    Report Status PENDING  Incomplete  CULTURE, BLOOD (ROUTINE X 2) w Reflex to ID Panel     Status: None (Preliminary result)   Collection Time: 08/28/19  3:05 PM   Specimen: BLOOD  Result Value Ref Range Status   Specimen Description BLOOD RIGHT ANTECUBITAL  Final   Special Requests   Final    BOTTLES DRAWN AEROBIC AND ANAEROBIC Blood Culture adequate volume   Culture   Final    NO GROWTH 4 DAYS Performed at [facility], [address], [place], [place] [address]    Report Status PENDING  Incomplete  Culture, blood (Routine X 2) w Reflex to ID Panel     Status: None (Preliminary result)   Collection Time: 08/31/19  9:13 PM   Specimen: BLOOD  Result Value Ref Range Status   Specimen Description BLOOD LEFT ANTECUBITAL  Final   Special Requests   Final    BOTTLES DRAWN AEROBIC ONLY Blood Culture adequate volume   Culture   Final    NO GROWTH < 12 HOURS Performed at [facility], [address] [place], [place] [address]    Report Status PENDING  Incomplete  Culture, blood (Routine X 2) w Reflex to ID Panel     Status: None (Preliminary result)   Collection Time: 08/31/19  9:13 PM   Specimen: BLOOD  Result Value Ref Range Status   Specimen Description BLOOD BLOOD LEFT HAND  Final   Special Requests   Final    BOTTLES DRAWN AEROBIC ONLY Blood Culture adequate volume   Culture   Final    NO GROWTH < 12 HOURS Performed at [facility], [address] [place], [place] [address]    Report Status PENDING  Incomplete     Labs: BNP (last 3 results) No  results for input(s): BNP in the last 8760 hours. Basic Metabolic Panel: Recent Labs  Lab 08/26/19 0538 08/27/19 0640 08/29/19 0535 08/31/19 0500 09/01/19 0530  NA 135 136 140 139 139  K 3.5 3.7 3.4* 3.7  3.7  CL 105 108 110 108 105  CO2 23 20* 24 25 27   GLUCOSE 107* 98 101* 105* 122*  BUN 6 11 <5* 6 5*  CREATININE 0.44 0.46 0.31* 0.41* 0.50  CALCIUM 8.2* 8.1* 8.3* 8.6* 8.8*   Liver Function Tests: Recent Labs  Lab 08/26/19 0538 08/29/19 0535  AST 37 42*  ALT 19 19  ALKPHOS 66 76  BILITOT 1.6* 1.2  PROT 6.3* 6.6  ALBUMIN 2.8* 2.9*   No results for input(s): LIPASE, AMYLASE in the last 168 hours. Recent Labs  Lab 08/27/19 0640 08/29/19 0535  AMMONIA 55* 38*   CBC: Recent Labs  Lab 08/26/19 0538 08/26/19 1524 08/29/19 0535 08/31/19 0500 09/01/19 0530  WBC 1.7* 1.8* 4.3 4.6 5.3  NEUTROABS 1.2*  --   --  2.7 3.2  HGB 10.4* 10.2* 10.4* 10.8* 10.8*  HCT 31.5* 30.6* 31.7* 32.8* 33.3*  MCV 85.8 86.2 85.4 84.3 86.3  PLT 53* 52* 88* 107* 113*   Cardiac Enzymes: No results for input(s): CKTOTAL, CKMB, CKMBINDEX, TROPONINI in the last 168 hours. BNP: Invalid input(s): POCBNP CBG: Recent Labs  Lab 08/28/19 0802 08/29/19 0811 08/30/19 0735 08/31/19 0728 09/01/19 0751  GLUCAP 85 92 112* 116* 89   D-Dimer No results for input(s): DDIMER in the last 72 hours. Hgb A1c No results for input(s): HGBA1C in the last 72 hours. Lipid Profile No results for input(s): CHOL, HDL, LDLCALC, TRIG, CHOLHDL, LDLDIRECT in the last 72 hours. Thyroid function studies No results for input(s): TSH, T4TOTAL, T3FREE, THYROIDAB in the last 72 hours.  Invalid input(s): FREET3 Anemia work up No results for input(s): VITAMINB12, FOLATE, FERRITIN, TIBC, IRON, RETICCTPCT in the last 72 hours. Urinalysis    Component Value Date/Time   COLORURINE YELLOW (A) 08/26/2019 1237   APPEARANCEUR CLEAR (A) 08/26/2019 1237   APPEARANCEUR Clear 11/30/2018 1616   LABSPEC 1.011 08/26/2019 1237    PHURINE 6.0 08/26/2019 1237   GLUCOSEU NEGATIVE 08/26/2019 1237   HGBUR SMALL (A) 08/26/2019 1237   BILIRUBINUR NEGATIVE 08/26/2019 1237   BILIRUBINUR Negative 11/30/2018 1616   KETONESUR NEGATIVE 08/26/2019 1237   PROTEINUR NEGATIVE 08/26/2019 1237   NITRITE NEGATIVE 08/26/2019 1237   LEUKOCYTESUR TRACE (A) 08/26/2019 1237   Sepsis Labs Invalid input(s): PROCALCITONIN,  WBC,  LACTICIDVEN Microbiology Recent Results (from the past 240 hour(s))  CULTURE, BLOOD (ROUTINE X 2) w Reflex to ID Panel     Status: Abnormal   Collection Time: 08/26/19  5:38 AM   Specimen: BLOOD LEFT FOREARM  Result Value Ref Range Status   Specimen Description BLOOD LEFT FOREARM  Final   Special Requests   Final    BOTTLES DRAWN AEROBIC AND ANAEROBIC Blood Culture results may not be optimal due to an excessive volume of blood received in culture bottles Performed at [facility], [address] [place], [place] [address]    Culture  Setup Time (A)  Final    YEAST AEROBIC BOTTLE ONLY CRITICAL VALUE NOTED.  VALUE IS CONSISTENT WITH PREVIOUSLY REPORTED AND CALLED VALUE. Performed at [facility], [address] [place], [place] [address]    Culture CANDIDA GLABRATA (A)  Final   Report Status 08/31/2019 FINAL  Final  CULTURE, BLOOD (ROUTINE X 2) w Reflex to ID Panel     Status: Abnormal (Preliminary result)   Collection Time: 08/26/19  5:38 AM   Specimen: BLOOD  Result Value Ref Range Status   Specimen Description   Final    BLOOD RIGHT MIDLINE  Performed at [facility], [address], [place], [place] [address]    Special Requests   Final    BOTTLES DRAWN AEROBIC AND ANAEROBIC Blood Culture adequate volume Performed at [facility], [address] [place], [place] [address]    Culture  Setup Time   Final    YEAST IN BOTH AEROBIC AND ANAEROBIC BOTTLES CRITICAL RESULT CALLED TO, READ BACK BY AND VERIFIED WITH: MICHAEL SIMPSON AT [id number] 08/27/19.PMF     Culture (A)  Final    CANDIDA ALBICANS CANDIDA GLABRATA Sent to [place] for further susceptibility testing. Performed at [facility], [place] [address] [place], [place] [address]    Report Status PENDING  Incomplete  Blood Culture ID Panel (Reflexed)     Status: Abnormal   Collection Time: 08/26/19  5:38 AM  Result Value Ref Range Status   Enterococcus species NOT DETECTED NOT DETECTED Final   Listeria monocytogenes NOT DETECTED NOT DETECTED Final   Staphylococcus species NOT DETECTED NOT DETECTED Final   Staphylococcus aureus (BCID) NOT DETECTED NOT DETECTED Final   Streptococcus species NOT DETECTED NOT DETECTED Final   Streptococcus agalactiae NOT DETECTED NOT DETECTED Final   Streptococcus pneumoniae NOT DETECTED NOT DETECTED Final   Streptococcus pyogenes NOT DETECTED NOT DETECTED Final   Acinetobacter baumannii NOT DETECTED NOT DETECTED Final   Enterobacteriaceae species NOT DETECTED NOT DETECTED Final   Enterobacter cloacae complex NOT DETECTED NOT DETECTED Final   Escherichia coli NOT DETECTED NOT DETECTED Final   Klebsiella oxytoca NOT DETECTED NOT DETECTED Final   Klebsiella pneumoniae NOT DETECTED NOT DETECTED Final   Proteus species NOT DETECTED NOT DETECTED Final   Serratia marcescens NOT DETECTED NOT DETECTED Final   Haemophilus influenzae NOT DETECTED NOT DETECTED Final   Neisseria meningitidis NOT DETECTED NOT DETECTED Final   Pseudomonas aeruginosa NOT DETECTED NOT DETECTED Final   Candida albicans DETECTED (A) NOT DETECTED Final    Comment: CRITICAL RESULT CALLED TO, READ BACK BY AND VERIFIED WITH: MICHAEL SIMPSON AT [id number] 08/27/19.PMF    Candida glabrata DETECTED (A) NOT DETECTED Final    Comment: CRITICAL RESULT CALLED TO, READ BACK BY AND VERIFIED WITH: MICHAEL SIMPSON AT [id number] 08/27/19.PMF    Candida krusei NOT DETECTED NOT DETECTED Final   Candida parapsilosis NOT DETECTED NOT DETECTED Final   Candida tropicalis NOT DETECTED NOT DETECTED Final     Comment: Performed at [facility], [address] [place],  [address]  Antifungal AST 9 Drug Panel     Status: None (Preliminary result)   Collection Time: 08/26/19  5:38 AM  Result Value Ref Range Status   Organism ID, Yeast Preliminary report  Final    Comment: Specimen has been received and testing has been initiated.   Amphotericin B MIC PENDING  Incomplete   Please Note: Comment  Final    Comment: (NOTE) CLSI does not have established guidelines for interpretation of these organism-drug combinations. For research use only.    Anidulafungin MIC PENDING  Incomplete   Caspofungin MIC PENDING  Incomplete   Micafungin MIC PENDING  Incomplete   Posaconazole MIC PENDING  Incomplete   Please Note: Comment  Final    Comment: (NOTE) Results for this test are for research purposes only by the assay's manufacturer.  The performance characteristics of this product have not been established.  Results should not be used as a diagnostic procedure without confirmation of the diagnosis by another medically established diagnostic product or procedure. Performed At: [facility]  [facility] [place], [place] [id number] Rush Farmer MD [id number]    Fluconazole Islt MIC PENDING  Incomplete   Flucytosine MIC PENDING  Incomplete   Itraconazole MIC PENDING  Incomplete   Voriconazole MIC PENDING  Incomplete   Source   Final    ORG 1 CANDIDA ALBICANS BLOOD CULTURE SUSCEPTIBILITY    Comment: Performed at [facility], [place] [address], [place], [place] [address]  Antifungal AST 9 Drug Panel     Status: None (Preliminary result)   Collection Time: 08/26/19  5:38 AM  Result Value Ref Range Status   Organism ID, Yeast Preliminary report  Final    Comment: Specimen has been received and testing has been initiated.   Amphotericin B MIC PENDING  Incomplete   Please Note: Comment  Final    Comment: (NOTE) CLSI does not have established guidelines for interpretation  of these organism-drug combinations. For research use only.    Anidulafungin MIC PENDING  Incomplete   Caspofungin MIC PENDING  Incomplete   Micafungin MIC PENDING  Incomplete   Posaconazole MIC PENDING  Incomplete   Please Note: Comment  Final    Comment: (NOTE) Results for this test are for research purposes only by the assay's manufacturer.  The performance characteristics of this product have not been established.  Results should not be used as a diagnostic procedure without confirmation of the diagnosis by another medically established diagnostic product or procedure. Performed At: [facility] [place], [place] [id number] Rush Farmer MD [id number]    Fluconazole Islt MIC PENDING  Incomplete   Flucytosine MIC PENDING  Incomplete   Itraconazole MIC PENDING  Incomplete   Voriconazole MIC PENDING  Incomplete   Source   Final    ORG 2 CANDIDA GLABRATA BLOOD CULTURE SUSCEPTIBILITY    Comment: Performed at [facility], [place] [address], [place], [place] [address]  CULTURE, BLOOD (ROUTINE X 2) w Reflex to ID Panel     Status: None (Preliminary result)   Collection Time: 08/28/19  3:05 PM   Specimen: BLOOD  Result Value Ref Range Status   Specimen Description BLOOD BLOOD RIGHT HAND  Final   Special Requests   Final    BOTTLES DRAWN AEROBIC AND ANAEROBIC Blood Culture adequate volume   Culture   Final    NO GROWTH 4 DAYS Performed at [facility], [address] [place], [place] [address]    Report Status PENDING  Incomplete  CULTURE, BLOOD (ROUTINE X 2) w Reflex to ID Panel     Status: None (Preliminary result)   Collection Time: 08/28/19  3:05 PM   Specimen: BLOOD  Result Value Ref Range Status   Specimen Description BLOOD RIGHT ANTECUBITAL  Final   Special Requests   Final    BOTTLES DRAWN AEROBIC AND ANAEROBIC Blood Culture adequate volume   Culture   Final    NO GROWTH 4 DAYS Performed at [facility], [address], [place], [place] [address]    Report Status PENDING  Incomplete  Culture, blood (Routine X 2) w Reflex to ID Panel     Status: None (Preliminary result)   Collection Time: 08/31/19  9:13 PM   Specimen: BLOOD  Result Value Ref Range Status   Specimen Description BLOOD LEFT ANTECUBITAL  Final   Special Requests   Final    BOTTLES DRAWN AEROBIC ONLY Blood Culture adequate volume   Culture   Final    NO  GROWTH < 12 HOURS Performed at [facility], [address] [place], [place] [address]    Report Status PENDING  Incomplete  Culture, blood (Routine X 2) w Reflex to ID Panel     Status: None (Preliminary result)   Collection Time: 08/31/19  9:13 PM   Specimen: BLOOD  Result Value Ref Range Status   Specimen Description BLOOD BLOOD LEFT HAND  Final   Special Requests   Final    BOTTLES DRAWN AEROBIC ONLY Blood Culture adequate volume   Culture   Final    NO GROWTH < 12 HOURS Performed at [facility], [address], [place], [place] [address]    Report Status PENDING  Incomplete     Time coordinating discharge: Over 30 minutes  SIGNED:   Sidney Ace, MD  [facility] 09/01/2019, 12:24 PM Pager   If 7PM-7AM, please contact night-coverage

## 2019-09-02 DIAGNOSIS — J181 Lobar pneumonia, unspecified organism: Secondary | ICD-10-CM | POA: Diagnosis not present

## 2019-09-02 LAB — CULTURE, BLOOD (ROUTINE X 2)
Culture: NO GROWTH
Culture: NO GROWTH
Special Requests: ADEQUATE
Special Requests: ADEQUATE

## 2019-09-02 LAB — ANTIFUNGAL AST 9 DRUG PANEL
Amphotericin B MIC: 1
Amphotericin B MIC: 1
Fluconazole Islt MIC: 0.5
Fluconazole Islt MIC: 8
Flucytosine MIC: 0.06
Flucytosine MIC: 0.5
Itraconazole MIC: 0.12
Itraconazole MIC: 0.5
Posaconazole MIC: 0.06
Posaconazole MIC: 1
Voriconazole MIC: 0.25

## 2019-09-04 ENCOUNTER — Telehealth: Payer: Self-pay

## 2019-09-04 NOTE — Telephone Encounter (Signed)
  Patient was recently discharged from the hospital on 09/01/2019.   No TCM completed, patient does not qualify for TCM services due to insurance coverage/medicaid.   Per d/c summary patient to follow up within 1-2 weeks.

## 2019-09-05 ENCOUNTER — Other Ambulatory Visit
Admission: RE | Admit: 2019-09-05 | Discharge: 2019-09-05 | Disposition: A | Discharge status: Home / Self Care | Payer: Medicaid Other | Source: Ambulatory Visit | Attending: Infectious Diseases | Admitting: Infectious Diseases

## 2019-09-05 DIAGNOSIS — J449 Chronic obstructive pulmonary disease, unspecified: Secondary | ICD-10-CM | POA: Diagnosis not present

## 2019-09-05 DIAGNOSIS — K7030 Alcoholic cirrhosis of liver without ascites: Secondary | ICD-10-CM | POA: Diagnosis not present

## 2019-09-05 DIAGNOSIS — D509 Iron deficiency anemia, unspecified: Secondary | ICD-10-CM | POA: Diagnosis not present

## 2019-09-05 LAB — CULTURE, BLOOD (ROUTINE X 2)
Culture: NO GROWTH
Culture: NO GROWTH
Special Requests: ADEQUATE
Special Requests: ADEQUATE
Special Requests: ADEQUATE

## 2019-09-05 LAB — CBC WITH DIFFERENTIAL/PLATELET
Abs Immature Granulocytes: 0.02 10*3/uL (ref 0.00–0.07)
Basophils Absolute: 0.1 10*3/uL (ref 0.0–0.1)
Basophils Relative: 1 %
Eosinophils Absolute: 0.2 10*3/uL (ref 0.0–0.5)
Eosinophils Relative: 2 %
HCT: 31.5 % — ABNORMAL LOW (ref 36.0–46.0)
Hemoglobin: 10.7 g/dL — ABNORMAL LOW (ref 12.0–15.0)
Immature Granulocytes: 0 %
Lymphocytes Relative: 16 %
Lymphs Abs: 1.4 10*3/uL (ref 0.7–4.0)
MCH: 28.8 pg (ref 26.0–34.0)
MCHC: 34 g/dL (ref 30.0–36.0)
MCV: 84.7 fL (ref 80.0–100.0)
Monocytes Absolute: 0.7 10*3/uL (ref 0.1–1.0)
Monocytes Relative: 8 %
Neutro Abs: 6.2 10*3/uL (ref 1.7–7.7)
Neutrophils Relative %: 73 %
Platelets: 172 10*3/uL (ref 150–400)
RBC: 3.72 MIL/uL — ABNORMAL LOW (ref 3.87–5.11)
RDW: 24 % — ABNORMAL HIGH (ref 11.5–15.5)
Smear Review: NORMAL
WBC: 8.5 10*3/uL (ref 4.0–10.5)
nRBC: 0 % (ref 0.0–0.2)

## 2019-09-05 LAB — COMPREHENSIVE METABOLIC PANEL
ALT: 21 U/L (ref 0–44)
AST: 40 U/L (ref 15–41)
Albumin: 2.9 g/dL — ABNORMAL LOW (ref 3.5–5.0)
Alkaline Phosphatase: 85 U/L (ref 38–126)
Anion gap: 9 (ref 5–15)
BUN: 6 mg/dL (ref 6–20)
CO2: 23 mmol/L (ref 22–32)
Calcium: 8.5 mg/dL — ABNORMAL LOW (ref 8.9–10.3)
Chloride: 104 mmol/L (ref 98–111)
Creatinine, Ser: 0.52 mg/dL (ref 0.44–1.00)
GFR calc Af Amer: >60 mL/min (ref >60)
GFR calc non Af Amer: >60 mL/min (ref >60)
Glucose, Bld: 100 mg/dL — ABNORMAL HIGH (ref 70–99)
Potassium: 3.8 mmol/L (ref 3.5–5.1)
Sodium: 136 mmol/L (ref 135–145)
Total Bilirubin: 1.2 mg/dL (ref 0.3–1.2)
Total Protein: 6.9 g/dL (ref 6.5–8.1)

## 2019-09-06 ENCOUNTER — Encounter
Admit: 2019-09-06 | Discharge: 2019-09-06 | Disposition: A | Discharge status: Home / Self Care | Payer: Medicaid Other | Attending: Orthopedic Surgery | Admitting: Orthopedic Surgery

## 2019-09-06 ENCOUNTER — Other Ambulatory Visit: Payer: Self-pay

## 2019-09-06 HISTORY — DX: Anxiety disorder, unspecified: F41.9

## 2019-09-06 HISTORY — DX: Thrombocytopenia, unspecified: D69.6

## 2019-09-06 HISTORY — DX: Alcohol abuse, uncomplicated: F10.10

## 2019-09-06 HISTORY — DX: Iron deficiency anemia, unspecified: D50.9

## 2019-09-06 HISTORY — DX: Hypokalemia: E87.6

## 2019-09-06 HISTORY — DX: Pneumonia, unspecified organism: J18.9

## 2019-09-06 NOTE — Pre-Procedure Instructions (Signed)
Phone interview with patient completed. Dr. Ronelle Nigh (anesthesia) was contacted and a discussion of the patient's past and recent medical history including recent hospital admissions for sepsis. The patient is currently on IV and po antibiotics for the sepsis. Per Dr. Ronelle Nigh; the patient needs to be off of antibiotics for 2 weeks and will need medical clearance prior to having the total knee surgery that is scheduled. Dr. Theodore Demark surgical coordinator notified of this and will let Dr. Rudene Christians know.

## 2019-09-06 NOTE — Patient Instructions (Signed)
Your procedure is scheduled on: Report to Day Surgery on the 2nd floor of the [facility]. To find out your arrival time, please call [phone] between 1PM - 3PM on:  REMEMBER: Instructions that are not followed completely may result in serious medical risk, up to and including death; or upon the discretion of your surgeon and anesthesiologist your surgery may need to be rescheduled.  Do not eat food after midnight the night before surgery.  No gum chewing, lozengers or hard candies.  You may however, drink CLEAR liquids up to 2 hours before you are scheduled to arrive for your surgery. Do not drink anything within 2 hours of the start of your surgery.  Clear liquids include: - water  - apple juice without pulp - gatorade (not RED) - black coffee or tea (Do NOT add milk or creamers to the coffee or tea) Do NOT drink anything that is not on this list.  Type 1 and Type 2 diabetics should only drink water.  ENSURE PRE-SURGERY CARBOHYDRATE DRINK:  Complete drinking 2 hours prior to scheduled arrival time.  TAKE THESE MEDICATIONS THE MORNING OF SURGERY WITH A SIP OF WATER:  (take one the night before and one on the morning of surgery - helps to prevent nausea after surgery.)  Use inhalers on the day of surgery and bring to the hospital.  Stop Metformin and Janumet 2 days prior to surgery.  Take 1/2 of usual insulin dose the night before surgery and none on the morning of surgery.  Follow recommendations from Cardiologist, Pulmonologist or PCP regarding stopping Aspirin, Coumadin, Plavix, Eliquis, Pradaxa, or Pletal.  Stop Anti-inflammatories (NSAIDS) such as Advil, Aleve, Ibuprofen, Motrin, Naproxen, Naprosyn and Aspirin based products such as Excedrin, Goodys Powder, BC Powder. (May take Tylenol or Acetaminophen if needed.)  Stop ANY OVER THE COUNTER supplements until after surgery. (May continue Vitamin D, Vitamin B, and multivitamin.)  No Alcohol for 24 hours before or  after surgery.  No Smoking including e-cigarettes for 24 hours prior to surgery.  No chewable tobacco products for at least 6 hours prior to surgery.  No nicotine patches on the day of surgery.  On the morning of surgery brush your teeth with toothpaste and water, you may rinse your mouth with mouthwash if you wish. Do not swallow any toothpaste or mouthwash.  Do not wear jewelry, make-up, hairpins, clips or nail polish.  Do not wear lotions, powders, or perfumes.   Do not shave 48 hours prior to surgery.   Contact lenses, hearing aids and dentures may not be worn into surgery.  Do not bring valuables to the hospital, including drivers license, insurance or credit cards.  [facility] is not responsible for any belongings or valuables.   Use CHG Soap or wipes as directed on instruction sheet.  Total Shoulder Arthroplasty:  use Benzolyl Peroxide 5% Gel as directed on instruction sheet.  Fleets enema or Magnesium Citrate as directed.  Bring your C-PAP to the hospital with you in case you may have to spend the night.   Notify your doctor if there is any change in your medical condition (cold, fever, infection).  Wear comfortable clothing (specific to your surgery type) to the hospital.  Plan for stool softeners for home use.  If you are being admitted to the hospital overnight, leave your suitcase in the car. After surgery it may be brought to your room.  If you are being discharged the day of surgery, you will not be allowed to  drive home. You will need a responsible adult to drive you home and stay with you that night.   If you are taking public transportation, you will need to have a responsible adult with you. Please confirm with your physician that it is acceptable to use public transportation.   Please call [phone] if you have any questions about these instructions.  Visitation Policy:  Patients undergoing a surgery or procedure in a hospital may have one  family member or support person with them as long as that person is not COVID-19 positive or experiencing its symptoms. That person may remain in the waiting area during the procedure. Should the patient need to stay at the hospital during part of their recovery, the support person may visit during visiting hours; 7 am to 8 pm.  Children under 24 years of age may have both parents or legal guardians with them during their hospital stay.   Inpatient Visitation Update:  Two designated support people may visit a patient during visiting hours 7 am to 8 pm. It must be the same two designated people for the duration of the patient stay. The visitors may come and go during the day, and there is no switching out to have different visitors. A mask must be worn at all times, including in the patient room.

## 2019-09-09 DIAGNOSIS — J181 Lobar pneumonia, unspecified organism: Secondary | ICD-10-CM | POA: Diagnosis not present

## 2019-09-12 ENCOUNTER — Inpatient Hospital Stay: Admit: 2019-09-12 | Payer: Medicaid Other

## 2019-09-13 ENCOUNTER — Other Ambulatory Visit: Payer: Self-pay | Admitting: Family Medicine

## 2019-09-13 ENCOUNTER — Ambulatory Visit: Payer: Medicaid Other | Admitting: Family Medicine

## 2019-09-13 NOTE — Telephone Encounter (Signed)
Requested Prescriptions  Pending Prescriptions Disp Refills  . hydrOXYzine (ATARAX/VISTARIL) 25 MG tablet [Pharmacy Med Name: HYDROXYZINE HCL 25 MG TABLET] 90 tablet 1    Sig: TAKE 1 TABLET BY MOUTH THREE TIMES A DAY AS NEEDED     Ear, Nose, and Throat:  Antihistamines Passed - 09/13/2019  3:16 PM      Passed - Valid encounter within last 12 months    Recent Outpatient Visits          1 month ago Hypokalemia   [place], Lilia Argue, PA-C   1 month ago Community acquired pneumonia, unspecified laterality   [facility] [place], [place]   3 months ago Primary insomnia   [place], [place], [place]   5 months ago Bilateral leg edema   [place], [place]   5 months ago Bilateral leg edema   [place], Lilia Argue, [place]      Future Appointments            In 1 month [name], Lilia Argue, [place], [place]

## 2019-09-14 ENCOUNTER — Inpatient Hospital Stay: Admission: RE | Admit: 2019-09-14 | Payer: Medicaid Other | Source: Home / Self Care | Admitting: Orthopedic Surgery

## 2019-09-14 ENCOUNTER — Encounter: Admission: RE | Payer: Self-pay | Source: Home / Self Care

## 2019-09-14 SURGERY — ARTHROPLASTY, KNEE, TOTAL
Anesthesia: Choice | Site: Knee | Laterality: Right

## 2019-09-18 ENCOUNTER — Encounter: Payer: Self-pay | Admitting: Family Medicine

## 2019-09-18 ENCOUNTER — Other Ambulatory Visit: Payer: Self-pay | Admitting: Family Medicine

## 2019-09-18 NOTE — Telephone Encounter (Signed)
Requested Prescriptions  Pending Prescriptions Disp Refills  . gabapentin (NEURONTIN) 300 MG capsule [Pharmacy Med Name: GABAPENTIN 300 MG CAPSULE] 90 capsule 0    Sig: TAKE 1-3 CAPSULES BY MOUTH DAILY AS NEEDED     Neurology: Anticonvulsants - gabapentin Passed - 09/18/2019 12:08 PM      Passed - Valid encounter within last 12 months    Recent Outpatient Visits          1 month ago Hypokalemia   [facility] [place], [place]   2 months ago Community acquired pneumonia, unspecified laterality   [facility] [place], [place]   3 months ago Primary insomnia   [place], [place], [place]   5 months ago Bilateral leg edema   [place], [place]   5 months ago Bilateral leg edema   [place], Lilia Argue, [place]      Future Appointments            In 1 month [name], Lilia Argue, [place], [place]

## 2019-09-22 ENCOUNTER — Encounter: Payer: Self-pay | Admitting: Family Medicine

## 2019-09-22 ENCOUNTER — Ambulatory Visit: Payer: Medicaid Other | Admitting: Licensed Clinical Social Worker

## 2019-09-22 NOTE — Chronic Care Management (AMB) (Signed)
Visit Information  Goals Addressed    . "I have a lot going on physically and mentally" (pt-stated)       Current Barriers:  . Chronic Mental Health needs related to Alcohol Abuse, Generalized Anxiety Disorder, Depression . Financial constraints related to managing health care needs . Limited social support . ADL IADL limitations . Mental Health Concerns  . Substance abuse issues -  alcohol . Social Isolation . Limited access to caregiver . Inability to perform ADL's independently . Suicidal Ideation/Homicidal Ideation: No  Clinical Social Work Goal(s):  Marland Kitchen Over the next 120 days, patient will work with SW  bi-monthly  by telephone or in person to reduce or manage symptoms related to her mental health . Over the next 120 days, patient will demonstrate improved health management independence as evidenced by implementing appropriate self-care methods into her daily routine to combat triggers and improve overall health and well-being   Interventions: . Patient interviewed and appropriate assessments performed: brief mental health assessment . Patient has severe anxiety, hx of trauma and substance abuse. Patient shares that she is currently dealing with a lot of health issues which has been her primary focus/concern now. Patient reports needing assistance with managing health pain and physical health conditions.  . Patient interviewed and appropriate assessments performed . Provided patient with information about available substance abuse resources within the area. Marland Kitchen LCSW discussed coping skills for anxiety. SW used active and reflective listening, validated patient's feelings/concerns, and provided emotional support. LCSW provided self-care education to help manage her multiple health conditions and improve her mood.  . Discussed plans with patient for ongoing care management follow up and provided patient with direct contact information for care management team . Advised patient to schedule  surgery when able but patient reports that she has to do a round of antibiotics before the will also her to schedule this surgery. Nash Dimmer with RN Case Manager re: referral for nursing . Assisted patient/caregiver with obtaining information about health plan benefits . Provided education and assistance to client regarding Advanced Directives. . Encouraged patient to consider a mental health provider for long term follow up and therapy/counseling but patient declined at this time. She reports that she is going through too much physically to work on this right now.  . Brief CBT provided during session today. Patient was receptive to therapy and education provided.   Patient Self Care Activities:  . Attends all scheduled provider appointments . Calls provider office for new concerns or questions . Ability for insight  Patient Coping Strengths:  . Conservator, museum/gallery . Able to Communicate Effectively  Patient Self Care Deficits:  . Lacks social connections  Initial goal documentation     Ms. Radaker was given information about Care Management services today including:  1. Care Management services include personalized support from designated clinical staff supervised by her physician, including individualized plan of care and coordination with other care providers 2. 24/7 contact phone numbers for assistance for urgent and routine care needs. 3. The patient may stop CCM services at any time (effective at the end of the month) by phone call to the office staff.  Patient agreed to services and verbal consent obtained.   The care management team will reach out to the patient again over the next quarter.  Eula Fried, BSW, MSW, [facility] [email or web address] Phone: [phone]

## 2019-09-29 DIAGNOSIS — Z79899 Other long term (current) drug therapy: Secondary | ICD-10-CM | POA: Diagnosis not present

## 2019-10-03 DIAGNOSIS — B182 Chronic viral hepatitis C: Secondary | ICD-10-CM | POA: Diagnosis not present

## 2019-10-03 DIAGNOSIS — B377 Candidal sepsis: Secondary | ICD-10-CM | POA: Diagnosis not present

## 2019-10-04 ENCOUNTER — Encounter: Payer: Self-pay | Admitting: Family Medicine

## 2019-10-05 ENCOUNTER — Telehealth: Payer: Self-pay

## 2019-10-05 NOTE — Telephone Encounter (Signed)
Received surgical clearance for patient. Please call and schedule clearance appointment ASAP as surgery is scheduled for 10/26/19.

## 2019-10-06 ENCOUNTER — Other Ambulatory Visit: Payer: Self-pay | Admitting: Family Medicine

## 2019-10-06 NOTE — Telephone Encounter (Signed)
Routing to provider  

## 2019-10-06 NOTE — Telephone Encounter (Signed)
Requested medication (s) are due for refill today: yes  Requested medication (s) are on the active medication list: yes  Last refill:  09/01/2019  Future visit scheduled: yes  Notes to clinic:  this refill cannot be delegated    Requested Prescriptions  Pending Prescriptions Disp Refills   zolpidem (AMBIEN) 5 MG tablet [Pharmacy Med Name: ZOLPIDEM TARTRATE 5 MG TABLET] 30 tablet 2    Sig: Take 1 tablet (5 mg total) by mouth at bedtime as needed for sleep.      Not Delegated - Psychiatry:  Anxiolytics/Hypnotics Failed - 10/06/2019 12:25 PM      Failed - This refill cannot be delegated      Passed - Urine Drug Screen completed in last 360 days.      Passed - Valid encounter within last 6 months    Recent Outpatient Visits           1 month ago Hypokalemia   [facility] Merrie Roof [place], [place]   2 months ago Community acquired pneumonia, unspecified laterality   [facility] Merrie Roof [place], [place]   3 months ago Primary insomnia   [place], [place], [place]   6 months ago Bilateral leg edema   [facility], [place]   6 months ago Bilateral leg edema   [place], Lilia Argue, [place]       Future Appointments             In 3 days [name], Lilia Argue, [place], [place]   In 59 month [place], [place] Arg[place], [place], [place]

## 2019-10-09 ENCOUNTER — Ambulatory Visit (INDEPENDENT_AMBULATORY_CARE_PROVIDER_SITE_OTHER): Payer: Medicaid Other | Admitting: Family Medicine

## 2019-10-09 ENCOUNTER — Encounter: Payer: Self-pay | Admitting: Family Medicine

## 2019-10-09 ENCOUNTER — Other Ambulatory Visit: Payer: Self-pay

## 2019-10-09 VITALS — BP 125/84 | HR 113 | Temp 99.8°F | Wt 124.0 lb

## 2019-10-09 DIAGNOSIS — Z01818 Encounter for other preprocedural examination: Secondary | ICD-10-CM

## 2019-10-09 LAB — UA/M W/RFLX CULTURE, ROUTINE
Bilirubin, UA: NEGATIVE
Glucose, UA: NEGATIVE
Ketones, UA: NEGATIVE
Leukocytes,UA: NEGATIVE
Nitrite, UA: NEGATIVE
Protein,UA: NEGATIVE
RBC, UA: NEGATIVE
Specific Gravity, UA: 1.02 (ref 1.005–1.030)
Urobilinogen, Ur: 1 mg/dL (ref 0.2–1.0)
pH, UA: 6.5 (ref 5.0–7.5)

## 2019-10-09 NOTE — Progress Notes (Signed)
BP 125/84   Pulse (!) 113   Temp 99.8 F (37.7 C)   Wt 124 lb (56.2 kg)   SpO2 97%   BMI 22.68 kg/m    Subjective:    [name], female    DOB: 1975-10-30, 44 y.o.   MRN: [id number]  HPI: Rhonda Martin is a 44 y.o. female  Chief Complaint  Patient presents with  . surgical clearance   Patient presenting today for surgical clearance for upcoming right total knee arthroscopy scheduled tentatively for 10/26/19. Overall doing well, no new health concerns or recent medication changes. Denies CP, SOB, syncope, N/V/D. Last EKG done in ER 08/2019 which was stable from previous, revealing sinus tachycardia but no ST or T wave abnormalities. Does not take NSAIDs or other anticoagulant medications. Does have chronic anemia suspected to be from liver cirrhosis and has COPD controlled off inhalers.   Relevant past medical, surgical, family and social history reviewed and updated as indicated. Interim medical history since our last visit reviewed. Allergies and medications reviewed and updated.  Review of Systems  Per HPI unless specifically indicated above     Objective:    BP 125/84   Pulse (!) 113   Temp 99.8 F (37.7 C)   Wt 124 lb (56.2 kg)   SpO2 97%   BMI 22.68 kg/m   Wt Readings from Last 3 Encounters:  10/09/19 124 lb (56.2 kg)  09/06/19 125 lb (56.7 kg)  08/22/19 127 lb 13.9 oz (58 kg)    Physical Exam Vitals and nursing note reviewed.  Constitutional:      Appearance: Normal appearance. She is not ill-appearing.  HENT:     Head: Atraumatic.  Eyes:     Extraocular Movements: Extraocular movements intact.     Conjunctiva/sclera: Conjunctivae normal.  Cardiovascular:     Rate and Rhythm: Regular rhythm. Tachycardia present.     Heart sounds: Normal heart sounds.  Pulmonary:     Effort: Pulmonary effort is normal.     Breath sounds: Normal breath sounds.  Abdominal:     General: Bowel sounds are normal. There is no distension.     Palpations:  Abdomen is soft.     Tenderness: There is no abdominal tenderness. There is no guarding.  Musculoskeletal:     Cervical back: Normal range of motion and neck supple.     Comments: Antalgic gait due to severe right knee pain and edema  Skin:    General: Skin is warm and dry.  Neurological:     Mental Status: She is alert and oriented to person, place, and time.  Psychiatric:        Mood and Affect: Mood normal.        Thought Content: Thought content normal.        Judgment: Judgment normal.     Results for orders placed or performed during the hospital encounter of 09/05/19  Comprehensive metabolic panel  Result Value Ref Range   Sodium 136 135 - 145 mmol/L   Potassium 3.8 3.5 - 5.1 mmol/L   Chloride 104 98 - 111 mmol/L   CO2 23 22 - 32 mmol/L   Glucose, Bld 100 (H) 70 - 99 mg/dL   BUN 6 6 - 20 mg/dL   Creatinine, Ser 0.52 0.44 - 1.00 mg/dL   Calcium 8.5 (L) 8.9 - 10.3 mg/dL   Total Protein 6.9 6.5 - 8.1 g/dL   Albumin 2.9 (L) 3.5 - 5.0 g/dL   AST  40 15 - 41 U/L   ALT 21 0 - 44 U/L   Alkaline Phosphatase 85 38 - 126 U/L   Total Bilirubin 1.2 0.3 - 1.2 mg/dL   GFR calc non Af Amer >60 >60 mL/min   GFR calc Af Amer >60 >60 mL/min   Anion gap 9 5 - 15  CBC with Differential/Platelet  Result Value Ref Range   WBC 8.5 4.0 - 10.5 K/uL   RBC 3.72 (L) 3.87 - 5.11 MIL/uL   Hemoglobin 10.7 (L) 12.0 - 15.0 g/dL   HCT 31.5 (L) 36.0 - 46.0 %   MCV 84.7 80.0 - 100.0 fL   MCH 28.8 26.0 - 34.0 pg   MCHC 34.0 30.0 - 36.0 g/dL   RDW 24.0 (H) 11.5 - 15.5 %   Platelets 172 150 - 400 K/uL   nRBC 0.0 0.0 - 0.2 %   Neutrophils Relative % 73 %   Neutro Abs 6.2 1.7 - 7.7 K/uL   Lymphocytes Relative 16 %   Lymphs Abs 1.4 0.7 - 4.0 K/uL   Monocytes Relative 8 %   Monocytes Absolute 0.7 0.1 - 1.0 K/uL   Eosinophils Relative 2 %   Eosinophils Absolute 0.2 0.0 - 0.5 K/uL   Basophils Relative 1 %   Basophils Absolute 0.1 0.0 - 0.1 K/uL   WBC Morphology MORPHOLOGY UNREMARKABLE    Smear  Review Normal platelet morphology    Immature Granulocytes 0 %   Abs Immature Granulocytes 0.02 0.00 - 0.07 K/uL   Polychromasia PRESENT    Target Cells PRESENT    Spherocytes PRESENT    Ovalocytes PRESENT       Assessment & Plan:   Problem List Items Addressed This Visit    None    Visit Diagnoses    Pre-op evaluation    -  Primary   Exam and vitals stable. Stable for surgery pending lab results.    Relevant Orders   CBC with Differential/Platelet   Comprehensive metabolic panel   UA/M w/rflx Culture, Routine   HgB A1c       Follow up plan: No follow-ups on file.

## 2019-10-10 LAB — COMPREHENSIVE METABOLIC PANEL
ALT: 25 IU/L (ref 0–32)
AST: 61 IU/L — ABNORMAL HIGH (ref 0–40)
Albumin/Globulin Ratio: 1 — ABNORMAL LOW (ref 1.2–2.2)
Albumin: 3.6 g/dL — ABNORMAL LOW (ref 3.8–4.8)
Alkaline Phosphatase: 132 IU/L — ABNORMAL HIGH (ref 39–117)
BUN/Creatinine Ratio: 10 (ref 9–23)
BUN: 5 mg/dL — ABNORMAL LOW (ref 6–24)
Bilirubin Total: 1.2 mg/dL (ref 0.0–1.2)
CO2: 22 mmol/L (ref 20–29)
Calcium: 8.9 mg/dL (ref 8.7–10.2)
Chloride: 100 mmol/L (ref 96–106)
Creatinine, Ser: 0.5 mg/dL — ABNORMAL LOW (ref 0.57–1.00)
GFR calc Af Amer: 137 mL/min/{1.73_m2} (ref >59)
GFR calc non Af Amer: 119 mL/min/{1.73_m2} (ref >59)
Globulin, Total: 3.5 g/dL (ref 1.5–4.5)
Glucose: 133 mg/dL — ABNORMAL HIGH (ref 65–99)
Potassium: 3.8 mmol/L (ref 3.5–5.2)
Sodium: 136 mmol/L (ref 134–144)
Total Protein: 7.1 g/dL (ref 6.0–8.5)

## 2019-10-10 LAB — CBC WITH DIFFERENTIAL/PLATELET
Basophils Absolute: 0.1 10*3/uL (ref 0.0–0.2)
Basos: 1 %
EOS (ABSOLUTE): 0.1 10*3/uL (ref 0.0–0.4)
Eos: 2 %
Hematocrit: 31.7 % — ABNORMAL LOW (ref 34.0–46.6)
Hemoglobin: 10.9 g/dL — ABNORMAL LOW (ref 11.1–15.9)
Immature Grans (Abs): 0 10*3/uL (ref 0.0–0.1)
Immature Granulocytes: 0 %
Lymphocytes Absolute: 1 10*3/uL (ref 0.7–3.1)
Lymphs: 25 %
MCH: 29.4 pg (ref 26.6–33.0)
MCHC: 34.4 g/dL (ref 31.5–35.7)
MCV: 85 fL (ref 79–97)
Monocytes Absolute: 0.5 10*3/uL (ref 0.1–0.9)
Monocytes: 14 %
Neutrophils Absolute: 2.3 10*3/uL (ref 1.4–7.0)
Neutrophils: 58 %
Platelets: 120 10*3/uL — ABNORMAL LOW (ref 150–450)
RBC: 3.71 x10E6/uL — ABNORMAL LOW (ref 3.77–5.28)
RDW: 15.6 % — ABNORMAL HIGH (ref 11.7–15.4)
WBC: 3.9 10*3/uL (ref 3.4–10.8)

## 2019-10-10 LAB — HEMOGLOBIN A1C
Est. average glucose Bld gHb Est-mCnc: 85 mg/dL
Hgb A1c MFr Bld: 4.6 % — ABNORMAL LOW (ref 4.8–5.6)

## 2019-10-13 ENCOUNTER — Other Ambulatory Visit: Payer: Medicaid Other

## 2019-10-17 ENCOUNTER — Telehealth: Payer: Medicaid Other

## 2019-10-18 ENCOUNTER — Inpatient Hospital Stay: Admission: RE | Admit: 2019-10-18 | Payer: Medicaid Other | Source: Ambulatory Visit

## 2019-10-18 ENCOUNTER — Other Ambulatory Visit: Payer: Self-pay | Admitting: Orthopedic Surgery

## 2019-10-19 ENCOUNTER — Encounter
Admission: RE | Admit: 2019-10-19 | Discharge: 2019-10-19 | Disposition: A | Discharge status: Home / Self Care | Payer: Medicaid Other | Source: Ambulatory Visit | Attending: Orthopedic Surgery | Admitting: Orthopedic Surgery

## 2019-10-19 ENCOUNTER — Other Ambulatory Visit: Payer: Self-pay

## 2019-10-19 DIAGNOSIS — Z01812 Encounter for preprocedural laboratory examination: Secondary | ICD-10-CM | POA: Insufficient documentation

## 2019-10-19 NOTE — Patient Instructions (Signed)
Your procedure is scheduled on: Thursday Oct 26, 2019 Report to Day Surgery. To find out your arrival time please call [phone] between 1PM - 3PM on Wednesday Oct 25, 2019.  Remember: Instructions that are not followed completely may result in serious medical risk,  up to and including death, or upon the discretion of your surgeon and anesthesiologist your  surgery may need to be rescheduled.     _X__ 1. Do not eat food after midnight the night before your procedure.                 No gum chewing or hard candies. You may drink clear liquids up to 2 hours                 before you are scheduled to arrive for your surgery- DO not drink clear                 liquids within 2 hours of the start of your surgery.                 Clear Liquids include:  water, apple juice without pulp, clear Gatorade, G2 or                  Gatorade Zero (avoid Red/Purple/Blue), Black Coffee or Tea (Do not add                 anything to coffee or tea).  __X___2.   Complete the carbohydrate drink provided to you, 2 hours before arrival.  __X__2.  On the morning of surgery brush your teeth with toothpaste and water, you                may rinse your mouth with mouthwash if you wish.  Do not swallow any toothpaste of mouthwash.     _X__ 3.  No Alcohol for 24 hours before or after surgery.   _X__ 4.  Do Not Smoke or use e-cigarettes For 24 Hours Prior to Your Surgery.                 Do not use any chewable tobacco products for at least 6 hours prior to                 Surgery.  _X__  5.  Do not use any recreational drugs (marijuana, cocaine, heroin, ecstacy, MDMA or other)                For at least one week prior to your surgery.  Combination of these drugs with anesthesia                May have life threatening results.   __x__ 6.  Notify your doctor if there is any change in your medical condition      (cold, fever, infections).     Do not wear jewelry, make-up, hairpins,  clips or nail polish. Do not wear lotions, powders, or perfumes. You may wear deodorant. Do not shave 48 hours prior to surgery. Men may shave face and neck. Do not bring valuables to the hospital.    [facility] is not responsible for any belongings or valuables.  Contacts, dentures or bridgework may not be worn into surgery. Leave your suitcase in the car. After surgery it may be brought to your room. For patients admitted to the hospital, discharge time is determined by your treatment team.   Patients discharged the day of surgery will not be allowed  to drive home.   Make arrangements for someone to be with you for the first 24 hours of your Same Day Discharge.    Please read over the following fact sheets that you were given:   Total Joint Packet    __x__ Take these medicines the morning of surgery with A SIP OF WATER:    1. gabapentin (NEURONTIN) 300   2. metoprolol succinate (TOPROL-XL) 25 MG  3. pantoprazole (PROTONIX) 40 MG    __x__ Use CHG Soap as directed  __x__ Stop Anti-inflammatories such as Ibuprofen, Aleve, naproxen, aspirin and or BC powders.   __x__ Stop supplements until after surgery.    __x__ Do not start any herbal supplements before your surgery.

## 2019-10-23 ENCOUNTER — Other Ambulatory Visit: Payer: Self-pay

## 2019-10-23 ENCOUNTER — Inpatient Hospital Stay
Admission: EM | Admit: 2019-10-23 | Discharge: 2019-11-02 | DRG: 378 | Disposition: A | Discharge status: Home Health | Payer: Medicaid Other | Attending: Internal Medicine | Admitting: Internal Medicine

## 2019-10-23 ENCOUNTER — Inpatient Hospital Stay: Payer: Medicaid Other | Admitting: Anesthesiology

## 2019-10-23 ENCOUNTER — Emergency Department: Payer: Medicaid Other

## 2019-10-23 ENCOUNTER — Encounter
Admission: EM | Disposition: A | Discharge status: Home Health | Payer: Self-pay | Source: Home / Self Care | Attending: Internal Medicine

## 2019-10-23 DIAGNOSIS — M25562 Pain in left knee: Secondary | ICD-10-CM | POA: Diagnosis present

## 2019-10-23 DIAGNOSIS — D62 Acute posthemorrhagic anemia: Secondary | ICD-10-CM | POA: Diagnosis present

## 2019-10-23 DIAGNOSIS — K254 Chronic or unspecified gastric ulcer with hemorrhage: Secondary | ICD-10-CM | POA: Diagnosis not present

## 2019-10-23 DIAGNOSIS — R918 Other nonspecific abnormal finding of lung field: Secondary | ICD-10-CM | POA: Diagnosis not present

## 2019-10-23 DIAGNOSIS — N179 Acute kidney failure, unspecified: Secondary | ICD-10-CM | POA: Diagnosis not present

## 2019-10-23 DIAGNOSIS — K7689 Other specified diseases of liver: Secondary | ICD-10-CM | POA: Diagnosis not present

## 2019-10-23 DIAGNOSIS — K920 Hematemesis: Secondary | ICD-10-CM

## 2019-10-23 DIAGNOSIS — I8501 Esophageal varices with bleeding: Secondary | ICD-10-CM | POA: Diagnosis not present

## 2019-10-23 DIAGNOSIS — R0902 Hypoxemia: Secondary | ICD-10-CM | POA: Diagnosis not present

## 2019-10-23 DIAGNOSIS — R1084 Generalized abdominal pain: Secondary | ICD-10-CM

## 2019-10-23 DIAGNOSIS — G600 Hereditary motor and sensory neuropathy: Secondary | ICD-10-CM | POA: Diagnosis not present

## 2019-10-23 DIAGNOSIS — K8020 Calculus of gallbladder without cholecystitis without obstruction: Secondary | ICD-10-CM | POA: Diagnosis not present

## 2019-10-23 DIAGNOSIS — K7030 Alcoholic cirrhosis of liver without ascites: Secondary | ICD-10-CM | POA: Diagnosis not present

## 2019-10-23 DIAGNOSIS — K3182 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: Secondary | ICD-10-CM | POA: Diagnosis present

## 2019-10-23 DIAGNOSIS — K7031 Alcoholic cirrhosis of liver with ascites: Secondary | ICD-10-CM | POA: Diagnosis present

## 2019-10-23 DIAGNOSIS — Z20822 Contact with and (suspected) exposure to covid-19: Secondary | ICD-10-CM | POA: Diagnosis present

## 2019-10-23 DIAGNOSIS — K7040 Alcoholic hepatic failure without coma: Secondary | ICD-10-CM | POA: Diagnosis present

## 2019-10-23 DIAGNOSIS — D649 Anemia, unspecified: Secondary | ICD-10-CM | POA: Diagnosis not present

## 2019-10-23 DIAGNOSIS — I10 Essential (primary) hypertension: Secondary | ICD-10-CM | POA: Diagnosis present

## 2019-10-23 DIAGNOSIS — K766 Portal hypertension: Secondary | ICD-10-CM | POA: Diagnosis present

## 2019-10-23 DIAGNOSIS — Z8541 Personal history of malignant neoplasm of cervix uteri: Secondary | ICD-10-CM | POA: Diagnosis not present

## 2019-10-23 DIAGNOSIS — Z79899 Other long term (current) drug therapy: Secondary | ICD-10-CM | POA: Diagnosis not present

## 2019-10-23 DIAGNOSIS — K209 Esophagitis, unspecified without bleeding: Secondary | ICD-10-CM

## 2019-10-23 DIAGNOSIS — E872 Acidosis, unspecified: Secondary | ICD-10-CM

## 2019-10-23 DIAGNOSIS — K922 Gastrointestinal hemorrhage, unspecified: Secondary | ICD-10-CM | POA: Diagnosis not present

## 2019-10-23 DIAGNOSIS — Z8249 Family history of ischemic heart disease and other diseases of the circulatory system: Secondary | ICD-10-CM

## 2019-10-23 DIAGNOSIS — M6281 Muscle weakness (generalized): Secondary | ICD-10-CM | POA: Diagnosis not present

## 2019-10-23 DIAGNOSIS — K2101 Gastro-esophageal reflux disease with esophagitis, with bleeding: Secondary | ICD-10-CM | POA: Diagnosis not present

## 2019-10-23 DIAGNOSIS — Y901 Blood alcohol level of 20-39 mg/100 ml: Secondary | ICD-10-CM | POA: Diagnosis present

## 2019-10-23 DIAGNOSIS — M25561 Pain in right knee: Secondary | ICD-10-CM

## 2019-10-23 DIAGNOSIS — B182 Chronic viral hepatitis C: Secondary | ICD-10-CM | POA: Diagnosis present

## 2019-10-23 DIAGNOSIS — R000 Tachycardia, unspecified: Secondary | ICD-10-CM | POA: Diagnosis not present

## 2019-10-23 DIAGNOSIS — J449 Chronic obstructive pulmonary disease, unspecified: Secondary | ICD-10-CM | POA: Diagnosis not present

## 2019-10-23 DIAGNOSIS — F17210 Nicotine dependence, cigarettes, uncomplicated: Secondary | ICD-10-CM | POA: Diagnosis present

## 2019-10-23 DIAGNOSIS — R7989 Other specified abnormal findings of blood chemistry: Secondary | ICD-10-CM

## 2019-10-23 DIAGNOSIS — R17 Unspecified jaundice: Secondary | ICD-10-CM

## 2019-10-23 DIAGNOSIS — K2100 Gastro-esophageal reflux disease with esophagitis, without bleeding: Secondary | ICD-10-CM | POA: Diagnosis present

## 2019-10-23 DIAGNOSIS — D696 Thrombocytopenia, unspecified: Secondary | ICD-10-CM | POA: Diagnosis present

## 2019-10-23 DIAGNOSIS — Z833 Family history of diabetes mellitus: Secondary | ICD-10-CM | POA: Diagnosis not present

## 2019-10-23 DIAGNOSIS — I959 Hypotension, unspecified: Secondary | ICD-10-CM | POA: Diagnosis not present

## 2019-10-23 DIAGNOSIS — Z765 Malingerer [conscious simulation]: Secondary | ICD-10-CM | POA: Diagnosis not present

## 2019-10-23 DIAGNOSIS — F332 Major depressive disorder, recurrent severe without psychotic features: Secondary | ICD-10-CM | POA: Diagnosis present

## 2019-10-23 DIAGNOSIS — R0689 Other abnormalities of breathing: Secondary | ICD-10-CM | POA: Diagnosis not present

## 2019-10-23 DIAGNOSIS — F1010 Alcohol abuse, uncomplicated: Secondary | ICD-10-CM | POA: Diagnosis present

## 2019-10-23 DIAGNOSIS — K31811 Angiodysplasia of stomach and duodenum with bleeding: Secondary | ICD-10-CM | POA: Diagnosis not present

## 2019-10-23 DIAGNOSIS — F10931 Alcohol use, unspecified with withdrawal delirium: Secondary | ICD-10-CM | POA: Diagnosis present

## 2019-10-23 DIAGNOSIS — F418 Other specified anxiety disorders: Secondary | ICD-10-CM | POA: Diagnosis not present

## 2019-10-23 DIAGNOSIS — I8510 Secondary esophageal varices without bleeding: Secondary | ICD-10-CM | POA: Diagnosis present

## 2019-10-23 DIAGNOSIS — R059 Cough, unspecified: Secondary | ICD-10-CM

## 2019-10-23 DIAGNOSIS — B1920 Unspecified viral hepatitis C without hepatic coma: Secondary | ICD-10-CM

## 2019-10-23 DIAGNOSIS — R2689 Other abnormalities of gait and mobility: Secondary | ICD-10-CM | POA: Diagnosis not present

## 2019-10-23 DIAGNOSIS — K7460 Unspecified cirrhosis of liver: Secondary | ICD-10-CM | POA: Diagnosis present

## 2019-10-23 DIAGNOSIS — F411 Generalized anxiety disorder: Secondary | ICD-10-CM | POA: Diagnosis present

## 2019-10-23 DIAGNOSIS — F10239 Alcohol dependence with withdrawal, unspecified: Secondary | ICD-10-CM | POA: Diagnosis not present

## 2019-10-23 HISTORY — PX: ESOPHAGOGASTRODUODENOSCOPY (EGD) WITH PROPOFOL: SHX5813

## 2019-10-23 HISTORY — DX: Hematemesis: K92.0

## 2019-10-23 LAB — PREPARE RBC (CROSSMATCH)

## 2019-10-23 LAB — COMPREHENSIVE METABOLIC PANEL
ALT: 210 U/L — ABNORMAL HIGH (ref 0–44)
AST: 838 U/L — ABNORMAL HIGH (ref 15–41)
Albumin: 2.8 g/dL — ABNORMAL LOW (ref 3.5–5.0)
Alkaline Phosphatase: 73 U/L (ref 38–126)
Anion gap: 33 — ABNORMAL HIGH (ref 5–15)
BUN: 47 mg/dL — ABNORMAL HIGH (ref 6–20)
CO2: 23 mmol/L (ref 22–32)
Calcium: 8.9 mg/dL (ref 8.9–10.3)
Chloride: 77 mmol/L — ABNORMAL LOW (ref 98–111)
Creatinine, Ser: 1.61 mg/dL — ABNORMAL HIGH (ref 0.44–1.00)
GFR calc Af Amer: 45 mL/min — ABNORMAL LOW (ref >60)
GFR calc non Af Amer: 39 mL/min — ABNORMAL LOW (ref >60)
Glucose, Bld: 127 mg/dL — ABNORMAL HIGH (ref 70–99)
Potassium: 3.3 mmol/L — ABNORMAL LOW (ref 3.5–5.1)
Sodium: 133 mmol/L — ABNORMAL LOW (ref 135–145)
Total Bilirubin: 4.2 mg/dL — ABNORMAL HIGH (ref 0.3–1.2)
Total Protein: 6 g/dL — ABNORMAL LOW (ref 6.5–8.1)

## 2019-10-23 LAB — CBC
HCT: 17.9 % — ABNORMAL LOW (ref 36.0–46.0)
Hemoglobin: 6.4 g/dL — ABNORMAL LOW (ref 12.0–15.0)
MCH: 30 pg (ref 26.0–34.0)
MCHC: 35.8 g/dL (ref 30.0–36.0)
MCV: 84 fL (ref 80.0–100.0)
Platelets: 45 10*3/uL — ABNORMAL LOW (ref 150–400)
RBC: 2.13 MIL/uL — ABNORMAL LOW (ref 3.87–5.11)
RDW: 16.4 % — ABNORMAL HIGH (ref 11.5–15.5)
WBC: 8.3 10*3/uL (ref 4.0–10.5)
nRBC: 0 % (ref 0.0–0.2)

## 2019-10-23 LAB — PROTIME-INR
INR: 1.8 — ABNORMAL HIGH (ref 0.8–1.2)
Prothrombin Time: 20.3 seconds — ABNORMAL HIGH (ref 11.4–15.2)

## 2019-10-23 LAB — LACTIC ACID, PLASMA
Lactic Acid, Venous: >11 mmol/L (ref 0.5–1.9)
Lactic Acid, Venous: >11 mmol/L (ref 0.5–1.9)
Lactic Acid, Venous: 1.4 mmol/L (ref 0.5–1.9)
Lactic Acid, Venous: 1.3 mmol/L (ref 0.5–1.9)

## 2019-10-23 LAB — CBC WITH DIFFERENTIAL/PLATELET
Abs Immature Granulocytes: 0.1 10*3/uL — ABNORMAL HIGH (ref 0.00–0.07)
Basophils Absolute: 0 10*3/uL (ref 0.0–0.1)
Basophils Relative: 0 %
Eosinophils Absolute: 0 10*3/uL (ref 0.0–0.5)
Eosinophils Relative: 0 %
HCT: 17.9 % — ABNORMAL LOW (ref 36.0–46.0)
Hemoglobin: 6.1 g/dL — ABNORMAL LOW (ref 12.0–15.0)
Immature Granulocytes: 1 %
Lymphocytes Relative: 11 %
Lymphs Abs: 1.7 10*3/uL (ref 0.7–4.0)
MCH: 29.3 pg (ref 26.0–34.0)
MCHC: 34.1 g/dL (ref 30.0–36.0)
MCV: 86.1 fL (ref 80.0–100.0)
Monocytes Absolute: 1.4 10*3/uL — ABNORMAL HIGH (ref 0.1–1.0)
Monocytes Relative: 9 %
Neutro Abs: 13.1 10*3/uL — ABNORMAL HIGH (ref 1.7–7.7)
Neutrophils Relative %: 79 %
Platelets: 117 10*3/uL — ABNORMAL LOW (ref 150–400)
RBC: 2.08 MIL/uL — ABNORMAL LOW (ref 3.87–5.11)
RDW: 19 % — ABNORMAL HIGH (ref 11.5–15.5)
WBC: 16.3 10*3/uL — ABNORMAL HIGH (ref 4.0–10.5)
nRBC: 0 % (ref 0.0–0.2)

## 2019-10-23 LAB — AMMONIA: Ammonia: 19 umol/L (ref 9–35)

## 2019-10-23 LAB — MRSA PCR SCREENING: MRSA by PCR: NEGATIVE

## 2019-10-23 LAB — ETHANOL: Alcohol, Ethyl (B): 29 mg/dL — ABNORMAL HIGH (ref <10)

## 2019-10-23 LAB — SARS CORONAVIRUS 2 BY RT PCR (HOSPITAL ORDER, PERFORMED IN ~~LOC~~ HOSPITAL LAB): SARS Coronavirus 2: NEGATIVE

## 2019-10-23 LAB — LIPASE, BLOOD: Lipase: 34 U/L (ref 11–51)

## 2019-10-23 LAB — POCT PREGNANCY, URINE: Preg Test, Ur: NEGATIVE

## 2019-10-23 SURGERY — ESOPHAGOGASTRODUODENOSCOPY (EGD) WITH PROPOFOL
Anesthesia: General

## 2019-10-23 MED ORDER — LORAZEPAM 2 MG/ML IJ SOLN
0.0000 mg | Freq: Four times a day (QID) | INTRAMUSCULAR | Status: AC
  Administered 2019-10-23: 1 mg via INTRAVENOUS
  Administered 2019-10-23: 2 mg via INTRAVENOUS
  Administered 2019-10-23: 3 mg via INTRAVENOUS
  Administered 2019-10-24: 2 mg via INTRAVENOUS
  Administered 2019-10-24: 4 mg via INTRAVENOUS
  Administered 2019-10-24 (×2): 2 mg via INTRAVENOUS
  Administered 2019-10-24: 1 mg via INTRAVENOUS
  Filled 2019-10-23 (×4): qty 1
  Filled 2019-10-23: qty 2
  Filled 2019-10-23: qty 1
  Filled 2019-10-23: qty 2

## 2019-10-23 MED ORDER — THIAMINE HCL 100 MG/ML IJ SOLN
100.0000 mg | Freq: Every day | INTRAMUSCULAR | Status: DC
  Administered 2019-10-23 – 2019-10-30 (×8): 100 mg via INTRAVENOUS
  Filled 2019-10-23 (×8): qty 2

## 2019-10-23 MED ORDER — SODIUM CHLORIDE 0.9 % IV BOLUS
1000.0000 mL | Freq: Once | INTRAVENOUS | Status: AC
  Administered 2019-10-23: 1000 mL via INTRAVENOUS

## 2019-10-23 MED ORDER — MIDAZOLAM HCL 2 MG/2ML IJ SOLN
INTRAMUSCULAR | Status: AC
  Filled 2019-10-23: qty 2

## 2019-10-23 MED ORDER — SODIUM CHLORIDE 0.9 % IV BOLUS: Freq: Once | INTRAVENOUS | Status: AC

## 2019-10-23 MED ORDER — DEXAMETHASONE SODIUM PHOSPHATE 10 MG/ML IJ SOLN
INTRAMUSCULAR | Status: DC | PRN
  Administered 2019-10-23: 10 mg via INTRAVENOUS

## 2019-10-23 MED ORDER — PROPOFOL 10 MG/ML IV BOLUS
INTRAVENOUS | Status: AC
  Filled 2019-10-23: qty 20

## 2019-10-23 MED ORDER — MORPHINE SULFATE (PF) 2 MG/ML IV SOLN
2.0000 mg | INTRAVENOUS | Status: DC | PRN
  Administered 2019-10-23 – 2019-10-27 (×14): 2 mg via INTRAVENOUS
  Filled 2019-10-23 (×17): qty 1

## 2019-10-23 MED ORDER — SODIUM CHLORIDE 0.9 % IV SOLN
8.0000 mg/h | INTRAVENOUS | Status: AC
  Administered 2019-10-23 – 2019-10-25 (×8): 8 mg/h via INTRAVENOUS
  Filled 2019-10-23 (×9): qty 80

## 2019-10-23 MED ORDER — ROCURONIUM BROMIDE 100 MG/10ML IV SOLN
INTRAVENOUS | Status: DC | PRN
  Administered 2019-10-23: 25 mg via INTRAVENOUS

## 2019-10-23 MED ORDER — MIDAZOLAM HCL 2 MG/2ML IJ SOLN
INTRAMUSCULAR | Status: DC | PRN
  Administered 2019-10-23 (×2): 1 mg via INTRAVENOUS

## 2019-10-23 MED ORDER — DEXMEDETOMIDINE HCL IN NACL 400 MCG/100ML IV SOLN
0.2000 ug/kg/h | INTRAVENOUS | Status: DC
  Administered 2019-10-23: 0.7 ug/kg/h via INTRAVENOUS
  Administered 2019-10-23: 0.5 ug/kg/h via INTRAVENOUS
  Administered 2019-10-24 (×3): 0.7 ug/kg/h via INTRAVENOUS
  Administered 2019-10-25: 0.2 ug/kg/h via INTRAVENOUS
  Administered 2019-10-25: 0.4 ug/kg/h via INTRAVENOUS
  Administered 2019-10-25 (×2): 0.7 ug/kg/h via INTRAVENOUS
  Filled 2019-10-23 (×4): qty 100

## 2019-10-23 MED ORDER — ESMOLOL HCL 100 MG/10ML IV SOLN
INTRAVENOUS | Status: AC
  Filled 2019-10-23: qty 10

## 2019-10-23 MED ORDER — ONDANSETRON HCL 4 MG/2ML IJ SOLN
INTRAMUSCULAR | Status: DC | PRN
  Administered 2019-10-23: 4 mg via INTRAVENOUS

## 2019-10-23 MED ORDER — LORAZEPAM 2 MG/ML IJ SOLN
0.5000 mg | Freq: Once | INTRAMUSCULAR | Status: AC
  Administered 2019-10-23: 0.5 mg via INTRAVENOUS
  Filled 2019-10-23: qty 1

## 2019-10-23 MED ORDER — SODIUM CHLORIDE 0.9 % IV SOLN
10.0000 mL/h | Freq: Once | INTRAVENOUS | Status: AC
  Administered 2019-10-23: 10 mL/h via INTRAVENOUS

## 2019-10-23 MED ORDER — PHENYLEPHRINE HCL (PRESSORS) 10 MG/ML IV SOLN
INTRAVENOUS | Status: AC
  Filled 2019-10-23: qty 1

## 2019-10-23 MED ORDER — FENTANYL CITRATE (PF) 100 MCG/2ML IJ SOLN
INTRAMUSCULAR | Status: AC
  Filled 2019-10-23: qty 2

## 2019-10-23 MED ORDER — LIDOCAINE HCL (CARDIAC) PF 100 MG/5ML IV SOSY
PREFILLED_SYRINGE | INTRAVENOUS | Status: DC | PRN
  Administered 2019-10-23: 60 mg via INTRAVENOUS
  Administered 2019-10-23: 40 mg via INTRAVENOUS

## 2019-10-23 MED ORDER — ONDANSETRON HCL 4 MG/2ML IJ SOLN
4.0000 mg | Freq: Four times a day (QID) | INTRAMUSCULAR | Status: DC | PRN
  Administered 2019-10-27 – 2019-10-28 (×2): 4 mg via INTRAVENOUS
  Filled 2019-10-23 (×2): qty 2

## 2019-10-23 MED ORDER — GLYCOPYRROLATE 0.2 MG/ML IJ SOLN: INTRAMUSCULAR | Status: DC | PRN

## 2019-10-23 MED ORDER — SODIUM CHLORIDE 0.9 % IV SOLN
50.0000 ug/h | INTRAVENOUS | Status: DC
  Administered 2019-10-23 – 2019-10-24 (×4): 50 ug/h via INTRAVENOUS
  Filled 2019-10-23 (×7): qty 1

## 2019-10-23 MED ORDER — SODIUM CHLORIDE 0.9 % IV SOLN
80.0000 mg | Freq: Once | INTRAVENOUS | Status: AC
  Administered 2019-10-23: 80 mg via INTRAVENOUS
  Filled 2019-10-23: qty 80

## 2019-10-23 MED ORDER — ONDANSETRON HCL 4 MG PO TABS
4.0000 mg | ORAL_TABLET | Freq: Four times a day (QID) | ORAL | Status: DC | PRN
  Administered 2019-10-27: 22:00:00 4 mg via ORAL
  Filled 2019-10-23: qty 1

## 2019-10-23 MED ORDER — SUCCINYLCHOLINE CHLORIDE 20 MG/ML IJ SOLN
INTRAMUSCULAR | Status: DC | PRN
  Administered 2019-10-23: 8 mg via INTRAVENOUS

## 2019-10-23 MED ORDER — SODIUM CHLORIDE 0.9% IV SOLUTION: Freq: Once | INTRAVENOUS | Status: AC

## 2019-10-23 MED ORDER — OCTREOTIDE LOAD VIA INFUSION
50.0000 ug | Freq: Once | INTRAVENOUS | Status: AC
  Administered 2019-10-23: 50 ug via INTRAVENOUS
  Filled 2019-10-23: qty 25

## 2019-10-23 MED ORDER — CHLORHEXIDINE GLUCONATE CLOTH 2 % EX PADS
6.0000 | MEDICATED_PAD | Freq: Every day | CUTANEOUS | Status: DC
  Administered 2019-10-23 – 2019-11-02 (×6): 6 via TOPICAL

## 2019-10-23 MED ORDER — NICOTINE 21 MG/24HR TD PT24
21.0000 mg | MEDICATED_PATCH | Freq: Every day | TRANSDERMAL | Status: DC
  Administered 2019-10-23 – 2019-11-02 (×11): 21 mg via TRANSDERMAL
  Filled 2019-10-23 (×11): qty 1

## 2019-10-23 MED ORDER — ONDANSETRON HCL 4 MG/2ML IJ SOLN: 4.0000 mg | Freq: Once | INTRAMUSCULAR | Status: DC | PRN

## 2019-10-23 MED ORDER — PROPOFOL 10 MG/ML IV BOLUS
INTRAVENOUS | Status: DC | PRN
  Administered 2019-10-23: 70 mg via INTRAVENOUS

## 2019-10-23 MED ORDER — SODIUM CHLORIDE 0.9 % IV SOLN: INTRAVENOUS | Status: DC

## 2019-10-23 MED ORDER — LACTATED RINGERS IV SOLN: INTRAVENOUS | Status: DC | PRN

## 2019-10-23 MED ORDER — ONDANSETRON HCL 4 MG/2ML IJ SOLN
4.0000 mg | Freq: Once | INTRAMUSCULAR | Status: AC
  Administered 2019-10-23: 4 mg via INTRAVENOUS
  Filled 2019-10-23: qty 2

## 2019-10-23 NOTE — ED Notes (Signed)
Awaiting Sandostatin from pharmacy

## 2019-10-23 NOTE — Op Note (Signed)
[facility] Patient Name: Rhonda Martin Procedure Date: 10/23/2019 2:33 PM MRN: [id number] Account #: [id number] Date of Birth: 28-Nov-1975 Admit Type: Inpatient Age: 44 Room: [facility] ENDO ROOM 4 Gender: Female Note Status: Finalized Procedure:             Upper GI endoscopy Indications:           Hematemesis Providers:             Lucilla Lame MD, MD Medicines:             General Anesthesia Complications:         No immediate complications. Procedure:             Pre-Anesthesia Assessment:                        - Prior to the procedure, a History and Physical was                         performed, and patient medications and allergies were                         reviewed. The patient's tolerance of previous                         anesthesia was also reviewed. The risks and benefits                         of the procedure and the sedation options and risks                         were discussed with the patient. All questions were                         answered, and informed consent was obtained. Prior                         Anticoagulants: The patient has taken no previous                         anticoagulant or antiplatelet agents. ASA Grade                         Assessment: III - A patient with severe systemic                         disease. After reviewing the risks and benefits, the                         patient was deemed in satisfactory condition to                         undergo the procedure.                        After obtaining informed consent, the endoscope was                         passed under direct vision. Throughout the procedure,  the patient's blood pressure, pulse, and oxygen                         saturations were monitored continuously. The Endoscope                         was introduced through the mouth, and advanced to the                         second part of duodenum. The upper GI  endoscopy was                         accomplished without difficulty. The patient tolerated                         the procedure well. Findings:      LA Grade C (one or more mucosal breaks continuous between tops of 2 or       more mucosal folds, less than 75% circumference) esophagitis with no       bleeding was found in the distal esophagus.      A Dieulafoy lesion with oozing bleeding and no stigmata of recent       bleeding was found in the gastric body. For hemostasis, one hemostatic       clip was successfully placed (MR conditional). There was no bleeding at       the end of the procedure.      The examined duodenum was normal. Impression:            - LA Grade C esophagitis with no bleeding.                        - Dieulafoy lesion of stomach.                        - Normal examined duodenum.                        - No specimens collected. Recommendation:        - Return patient to hospital ward for ongoing care.                        - Clear liquid diet.                        - Continue present medications. Procedure Code(s):     --- Professional ---                        [phone], Esophagogastroduodenoscopy, flexible,                         transoral; with control of bleeding, any method Diagnosis Code(s):     --- Professional ---                        K92.0, Hematemesis                        K20.90, Esophagitis, unspecified without bleeding  K31.82, Dieulafoy lesion (hemorrhagic) of stomach and                         duodenum CPT copyright 2019 [employer]. All rights reserved. The codes documented in this report are preliminary and upon coder review may  be revised to meet current compliance requirements. Lucilla Lame MD, MD 10/23/2019 3:30:10 PM This report has been signed electronically. Number of Addenda: 0 Note Initiated On: 10/23/2019 2:33 PM Estimated Blood Loss:  Estimated blood loss: none.      [facility]

## 2019-10-23 NOTE — ED Notes (Signed)
Pharmacy contacted to send pantoprazole for continuous drip at this time

## 2019-10-23 NOTE — Transfer of Care (Signed)
Immediate Anesthesia Transfer of Care Note  Patient: Rhonda Martin  Procedure(s) Performed: ESOPHAGOGASTRODUODENOSCOPY (EGD) WITH PROPOFOL (N/A )  Patient Location: PACU  Anesthesia Type:General  Level of Consciousness: awake, drowsy and patient cooperative  Airway & Oxygen Therapy: Patient Spontanous Breathing  Post-op Assessment: Report given to RN and Post -op Vital signs reviewed and stable  Post vital signs: Reviewed and stable  Last Vitals:  Vitals Value Taken Time  BP 134/111 10/23/19 1540  Temp    Pulse 134 10/23/19 1544  Resp 15 10/23/19 1544  SpO2 94 % 10/23/19 1544  Vitals shown include unvalidated device data.  Last Pain:  Vitals:   10/23/19 1459  TempSrc: Temporal  PainSc: 10-Worst pain ever         Complications: No apparent anesthesia complications

## 2019-10-23 NOTE — ED Triage Notes (Signed)
Pt arrives from home via [facility] with complaints of bloody emesis.   Starting Friday pt was complaining of N/V and began to experience bloody emesis last night. With EMS pt had initial BP of [id number] systolic and it came down to 123456 systolic. Pt on arrival is pale and complaining of nausea and wanting water.
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
## 2019-10-23 NOTE — Progress Notes (Signed)
Spoke with RN Raquel Sarna about the patient's IV access. At this time the patient has 1 PIV and previously had 2 others that have infiltrated and occluded. I attempted twice with ultrasound and was unsuccessful. I recommended a central line to the RN and MD who was on the unit at the time. Patient has had a PICC line in the past.

## 2019-10-23 NOTE — Progress Notes (Signed)
Pt transferred to ICU 115. VSS. ST on the monitor. Alert and oriented X3. Disoriented to situation. Pt is able to move all extremities symmetrically. Scoring 7 on CIWA scale, Ativan administered per order. Due to void. rm air.
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
## 2019-10-23 NOTE — Anesthesia Postprocedure Evaluation (Signed)
Anesthesia Post Note  Patient: Rhonda Martin  Procedure(s) Performed: ESOPHAGOGASTRODUODENOSCOPY (EGD) WITH PROPOFOL (N/A )  Patient location during evaluation: PACU Anesthesia Type: General Level of consciousness: awake and alert Pain management: pain level controlled Vital Signs Assessment: post-procedure vital signs reviewed and stable Respiratory status: spontaneous breathing, nonlabored ventilation and respiratory function stable Cardiovascular status: blood pressure returned to baseline and stable Postop Assessment: no apparent nausea or vomiting Anesthetic complications: no     Last Vitals:  Vitals:   10/23/19 2145 10/23/19 2315  BP: 104/69   Pulse: (!) 128   Resp:    Temp: 37.1 C 37 C  SpO2:      Last Pain:  Vitals:   10/23/19 2315  TempSrc: Axillary  PainSc:                  Tera Mater

## 2019-10-23 NOTE — Anesthesia Procedure Notes (Signed)
Procedure Name: Intubation Performed by: Kelton Pillar, CRNA Pre-anesthesia Checklist: Patient identified, Emergency Drugs available, Patient being monitored and Suction available Patient Re-evaluated:Patient Re-evaluated prior to induction Oxygen Delivery Method: Circle system utilized Preoxygenation: Pre-oxygenation with 100% oxygen Induction Type: IV induction and Rapid sequence Laryngoscope Size: McGraph and 3 Grade View: Grade I Tube type: Oral Tube size: 6.5 mm Number of attempts: 1 Airway Equipment and Method: Stylet Placement Confirmation: ETT inserted through vocal cords under direct vision,  positive ETCO2,  breath sounds checked- equal and bilateral and CO2 detector Secured at: 21 cm Tube secured with: Tape Dental Injury: Teeth and Oropharynx as per pre-operative assessment

## 2019-10-23 NOTE — Anesthesia Preprocedure Evaluation (Signed)
Anesthesia Evaluation  Patient identified by MRN, date of birth, ID band Patient awake    Reviewed: Allergy & Precautions, NPO status , Patient's Chart, lab work & pertinent test results  History of Anesthesia Complications Negative for: history of anesthetic complications  Airway Mallampati: II  TM Distance: >3 FB Neck ROM: Full    Dental  (+) Poor Dentition   Pulmonary COPD, Current Smoker and Patient abstained from smoking.,    breath sounds clear to auscultation- rhonchi (-) wheezing      Cardiovascular hypertension, Pt. on medications (-) CAD, (-) Past MI, (-) Cardiac Stents and (-) CABG  Rhythm:Regular Rate:Normal - Systolic murmurs and - Diastolic murmurs    Neuro/Psych neg Seizures PSYCHIATRIC DISORDERS Anxiety Depression negative neurological ROS     GI/Hepatic GERD  ,(+) Cirrhosis       , Hepatitis -  Endo/Other  negative endocrine ROSneg diabetes  Renal/GU negative Renal ROS     Musculoskeletal negative musculoskeletal ROS (+)   Abdominal (+) - obese,   Peds  Hematology  (+) anemia ,   Anesthesia Other Findings Past Medical History: No date: Alcohol abuse No date: Anxiety No date: Back injury No date: Cervical cancer ([facility]) 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 No date: COPD (chronic obstructive pulmonary disease) ([facility]) No date: Family history of adverse reaction to anesthesia     Comment:  PONV No date: GERD (gastroesophageal reflux disease) No date: Hepatitis No date: Hypertension No date: Hypokalemia 06/26/2019: IDA (iron deficiency anemia) No date: Iron deficiency anemia No date: Leg injury No date: Liver cirrhosis ([place]) No date: Pneumonia 07/10/2019: Sepsis ([place]) 06/26/2019: Symptomatic anemia No date: Thrombocytopenia ([place])   Reproductive/Obstetrics                             Anesthesia Physical Anesthesia Plan  ASA: III  Anesthesia Plan: General    Post-op Pain Management:    Induction: Intravenous  PONV Risk Score and Plan: 1 and Ondansetron and Dexamethasone  Airway Management Planned: Oral ETT  Additional Equipment:   Intra-op Plan:   Post-operative Plan: Extubation in OR  Informed Consent: I have reviewed the patients History and Physical, chart, labs and discussed the procedure including the risks, benefits and alternatives for the proposed anesthesia with the patient or authorized representative who has indicated his/her understanding and acceptance.     Dental advisory given  Plan Discussed with: CRNA and Anesthesiologist  Anesthesia Plan Comments:         Anesthesia Quick Evaluation

## 2019-10-23 NOTE — ED Notes (Signed)
This rn monitoring pt for first 15 min of blood transfusion.

## 2019-10-23 NOTE — ED Notes (Signed)
Pt provided phone to call family.

## 2019-10-23 NOTE — Care Plan (Signed)
Rhonda Martin is a 44 yo female with medical history significant for alcoholic cirrhosis, alcohol abuse,  hepatitis C,, hypertension, COPD, GERD, anxiety, iron deficiency anemia, cervical cancer, tobacco abuse, thrombocytopeniaWho presented with several episodes of emesis of bright red blood over the past 2 days associated with epigastric pain.  On EMS evaluation patient was found to have a systolic blood pressure initially in the 80s and reportedly came down to the 40s.  In the ED patient had T-max 99, tachycardic to the 130s , BP 96/49.  Blood work notable for hemoglobin of 6.4, platelet 45, lactic acid greater than 11.  Patient is also found to have creatinine of 1.69. Chest x-ray was unremarkable Patient was given IV fluid bolus, started on maintenance IV fluids, IV octreotide, IV Protonix, blood transfusion and admitted under [facility] management.  GI was consulted given concern for hematemesis in patient with cirrhosis with high concern for esophageal varices  Hematemesis in patient with liver cirrhosis, high concern for esophageal varices.  No repeat episodes in hospital. -Currently n.p.o., GI consulted, plan for EGD -Continue IV octreotide, IV Protonix, IV fluids -Daily CBC  Acute on chronic anemia in setting of acute blood loss related to above.  Status post 2 units of packed red blood cells -Follow CBC  Severe lactic acidosis in setting of profound hypotension related to acute blood loss.  Blood pressures currently normotensive.  -continue to trend lactic acid -Continue IV fluids of   Elevated LFTs and hyperbilirubinemia.  AST 38, ALT 210 bili elevated at 4.2 suspect some hepatic shock liver given quite hypotensive episodes prior to hospital presentation in setting of significant blood loss.  May warrant further abdominal imaging after emergent EGD -Trend CMP  AKI, likely prerenal related to blood loss.  Creatinine 1.61. -Continue IV fluids -Repeat BMP, -avoid nephrotoxins, monitor  output  Alcoholic cirrhosis with history of ongoing alcohol abuse.  High concern for esophageal varices.  Ammonia levels within normal limits.  INR 1.8.  Last drink list a day prior to admission,ethanol on admission 29 -Monitor on CIWA  Acute on chronic thrombocytopenia.  Baseline thrombocytopenia related to ongoing cirrhosis -Daily CBC cytosis, suspect reactive in the setting of acute blood loss.  Remains afebrile. -Trend CBC  Awake, somnolent but easily arousable No acute deficits Tachycardic, no appreciable murmurs Abdomen soft, slightly tender in epigastric area without rebound tenderness or guarding No ascites No jaundice  Remainder per H&P by admitting physician

## 2019-10-23 NOTE — ED Notes (Signed)
Pt transported to endo at this time.  
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
## 2019-10-24 ENCOUNTER — Other Ambulatory Visit: Admission: RE | Admit: 2019-10-24 | Payer: Medicaid Other | Source: Ambulatory Visit

## 2019-10-24 ENCOUNTER — Inpatient Hospital Stay: Admission: RE | Admit: 2019-10-24 | Payer: Medicaid Other | Source: Ambulatory Visit

## 2019-10-24 DIAGNOSIS — R17 Unspecified jaundice: Secondary | ICD-10-CM

## 2019-10-24 DIAGNOSIS — R7989 Other specified abnormal findings of blood chemistry: Secondary | ICD-10-CM | POA: Diagnosis present

## 2019-10-24 DIAGNOSIS — I959 Hypotension, unspecified: Secondary | ICD-10-CM

## 2019-10-24 DIAGNOSIS — B182 Chronic viral hepatitis C: Secondary | ICD-10-CM

## 2019-10-24 DIAGNOSIS — D696 Thrombocytopenia, unspecified: Secondary | ICD-10-CM

## 2019-10-24 DIAGNOSIS — D649 Anemia, unspecified: Secondary | ICD-10-CM

## 2019-10-24 LAB — CBC
HCT: 23.6 % — ABNORMAL LOW (ref 36.0–46.0)
HCT: 21.1 % — ABNORMAL LOW (ref 36.0–46.0)
Hemoglobin: 8 g/dL — ABNORMAL LOW (ref 12.0–15.0)
Hemoglobin: 7 g/dL — ABNORMAL LOW (ref 12.0–15.0)
MCH: 29.7 pg (ref 26.0–34.0)
MCH: 29.3 pg (ref 26.0–34.0)
MCHC: 33.9 g/dL (ref 30.0–36.0)
MCHC: 33.2 g/dL (ref 30.0–36.0)
MCV: 87.7 fL (ref 80.0–100.0)
MCV: 88.3 fL (ref 80.0–100.0)
Platelets: 43 10*3/uL — ABNORMAL LOW (ref 150–400)
Platelets: 39 10*3/uL — ABNORMAL LOW (ref 150–400)
RBC: 2.69 MIL/uL — ABNORMAL LOW (ref 3.87–5.11)
RBC: 2.39 MIL/uL — ABNORMAL LOW (ref 3.87–5.11)
RDW: 16.3 % — ABNORMAL HIGH (ref 11.5–15.5)
RDW: 17.1 % — ABNORMAL HIGH (ref 11.5–15.5)
WBC: 3.7 10*3/uL — ABNORMAL LOW (ref 4.0–10.5)
WBC: 3.3 10*3/uL — ABNORMAL LOW (ref 4.0–10.5)
nRBC: 0 % (ref 0.0–0.2)
nRBC: 0 % (ref 0.0–0.2)

## 2019-10-24 LAB — MAGNESIUM: Magnesium: 1.9 mg/dL (ref 1.7–2.4)

## 2019-10-24 LAB — COMPREHENSIVE METABOLIC PANEL
ALT: 182 U/L — ABNORMAL HIGH (ref 0–44)
AST: 723 U/L — ABNORMAL HIGH (ref 15–41)
Albumin: 2.6 g/dL — ABNORMAL LOW (ref 3.5–5.0)
Alkaline Phosphatase: 58 U/L (ref 38–126)
Anion gap: 9 (ref 5–15)
BUN: 48 mg/dL — ABNORMAL HIGH (ref 6–20)
CO2: 30 mmol/L (ref 22–32)
Calcium: 6.6 mg/dL — ABNORMAL LOW (ref 8.9–10.3)
Chloride: 98 mmol/L (ref 98–111)
Creatinine, Ser: 0.88 mg/dL (ref 0.44–1.00)
GFR calc Af Amer: >60 mL/min (ref >60)
GFR calc non Af Amer: >60 mL/min (ref >60)
Glucose, Bld: 161 mg/dL — ABNORMAL HIGH (ref 70–99)
Potassium: 3.5 mmol/L (ref 3.5–5.1)
Sodium: 137 mmol/L (ref 135–145)
Total Bilirubin: 3.8 mg/dL — ABNORMAL HIGH (ref 0.3–1.2)
Total Protein: 5.4 g/dL — ABNORMAL LOW (ref 6.5–8.1)

## 2019-10-24 LAB — PHOSPHORUS: Phosphorus: 4.7 mg/dL — ABNORMAL HIGH (ref 2.5–4.6)

## 2019-10-24 LAB — PROTIME-INR
INR: 1.8 — ABNORMAL HIGH (ref 0.8–1.2)
Prothrombin Time: 20.2 seconds — ABNORMAL HIGH (ref 11.4–15.2)

## 2019-10-24 LAB — PREPARE RBC (CROSSMATCH)

## 2019-10-24 MED ORDER — SODIUM CHLORIDE 0.9% IV SOLUTION: Freq: Once | INTRAVENOUS | Status: DC

## 2019-10-24 MED ORDER — FOLIC ACID 1 MG PO TABS
1.0000 mg | ORAL_TABLET | Freq: Every day | ORAL | Status: DC
  Administered 2019-10-24 – 2019-11-02 (×10): 1 mg via ORAL
  Filled 2019-10-24 (×10): qty 1

## 2019-10-24 MED ORDER — SODIUM CHLORIDE 0.9 % IV BOLUS
500.0000 mL | Freq: Once | INTRAVENOUS | Status: AC
  Administered 2019-10-24: 500 mL via INTRAVENOUS

## 2019-10-24 MED ORDER — OXYCODONE HCL 5 MG PO TABS
5.0000 mg | ORAL_TABLET | Freq: Every day | ORAL | Status: DC | PRN
  Administered 2019-10-24 – 2019-10-28 (×12): 5 mg via ORAL
  Filled 2019-10-24 (×12): qty 1

## 2019-10-24 MED ORDER — LORAZEPAM 1 MG PO TABS
1.0000 mg | ORAL_TABLET | ORAL | Status: AC | PRN
  Administered 2019-10-25 – 2019-10-26 (×3): 1 mg via ORAL
  Filled 2019-10-24 (×3): qty 1

## 2019-10-24 MED ORDER — ADULT MULTIVITAMIN W/MINERALS CH
1.0000 | ORAL_TABLET | Freq: Every day | ORAL | Status: DC
  Administered 2019-10-24 – 2019-11-02 (×10): 1 via ORAL
  Filled 2019-10-24 (×10): qty 1

## 2019-10-24 MED ORDER — SODIUM CHLORIDE 0.9 % IV SOLN
2.0000 g | Freq: Every day | INTRAVENOUS | Status: DC
  Administered 2019-10-24 – 2019-10-27 (×4): 2 g via INTRAVENOUS
  Filled 2019-10-24 (×3): qty 2
  Filled 2019-10-24: qty 20
  Filled 2019-10-24: qty 2

## 2019-10-24 MED ORDER — LORAZEPAM 2 MG/ML IJ SOLN
1.0000 mg | INTRAMUSCULAR | Status: AC | PRN
  Administered 2019-10-25: 1 mg via INTRAVENOUS
  Administered 2019-10-25 – 2019-10-27 (×9): 2 mg via INTRAVENOUS
  Filled 2019-10-24 (×11): qty 1

## 2019-10-24 NOTE — Progress Notes (Signed)
Hgb 7, MD Nettey made aware and new orders received. RN also made MD aware that pt is complaining of pain but BP is too soft for pain medications. Bolus ordered. Per MD wean off precedex if able.

## 2019-10-24 NOTE — Progress Notes (Signed)
[facility]  PROGRESS NOTE  Rhonda Martin [id number] DOB: 05-Apr-1976 DOA: 10/23/2019 PCP: Volney American, PA-C Admit date - 10/23/2019   Admitting Physician Athena Masse, MD  Outpatient Primary MD for the patient is Volney American, PA-C  LOS - 1 Brief Narrative  Rhonda Martin is a 44 yo female with medical history significant for alcoholic cirrhosis, alcohol abuse,  hepatitis C,, hypertension, COPD, GERD, anxiety, iron deficiency anemia, cervical cancer, tobacco abuse, thrombocytopeniaWho presented with several episodes of emesis of bright red blood over the past 2 days associated with epigastric pain.  On EMS evaluation patient was found to have a systolic blood pressure initially in the 80s and reportedly came down to the 40s.  In the ED patient had T-max 99, tachycardic to the 130s , BP 96/49.  Blood work notable for hemoglobin of 6.4, platelet 45, lactic acid greater than 11.  Patient also found to have creatinine of 1.69. Chest x-ray was unremarkable  Patient was given IV fluid bolus, started on maintenance IV fluids, IV octreotide, IV Protonix, blood transfusion and admitted under [facility] management.  GI was consulted given concern for hematemesis in patient with cirrhosis with high concern for esophageal varices.  EGD with  Dieulafoy lesion oozing bleeding but no stigmata of recent bleeding, status post hemostatic clip, and grade C esophagitis with no bleeding and no evidence of esophageal varices.  Patient's octreotide was discontinued.  Patient received 2 units of packed red blood cells on admission.    Subjective  Overnight patient was started on Precedex drip for worsening agitation  A & P   Hematemesis in patient with liver cirrhosis secondary to esophagitis and Dieulafoy lesion. No esophageal varices on EGD, resolved.  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  .  No recurrent hematemesis during admission. -GI following, advance to  soft diet, discontinue octreotide - IV Protonix drip, IV fluids -Daily CBC  Acute on chronic anemia in setting of acute blood loss related to above.  Status post 2 units of packed red blood cells on admission 6.1, improved to 8 this a.m, now 7 with no recurrent hematemesis. -Transfuse to maintain goal hemoglobin greater than 8 -Follow CBC  Hypotension in setting of acute blood loss.  No longer has lactic acidosis.  SBP's in the 90s which is better but still needs improvement.  Likely also worsened while on Precedex drip. -We will give another 500 cc bolus -Needs to continue IV fluids-holding home Toprol  Severe lactic acidosis in setting of profound hypotension related to acute blood loss, resolved.  Lactic acid greater than 11 on admission, back within normal range after IV fluids -Continue IV fluids   Elevated LFTs and hyperbilirubinemia suspect multifactorial etiology. Combination of hepatic shock liver related to hypotensive episodes, alcohol hepatitis, hepatitis C, concern for potential HBV reactivation versus acute hep B infection  given hep B core IgM positive but hep B surface antigen negative -Check hep B E antigen, HBV DNA, HB surface antibody, or HCVRNA -Trend CMP -Right upper quadrant ultrasound  AKI, likely prerenal related to blood loss, resolved with IV fluids.  Creatinine 1.61, now back to baseline less than 1. -Monitor BMP, -avoid nephrotoxins, monitor output  Alcoholic cirrhosis with history of ongoing alcohol abuse, with signs of alcohol withdrawal.  Increased agitation and confusion overnight required Precedex drip.  No varices on EGD, Ammonia levels within normal limits.  INR 1.8.   -Monitor on CIWA schedule Ativan per protocol with as needed -currently requiring  Precedex drip -Daily INR  Acute on chronic thrombocytopenia.  Baseline thrombocytopenia (usually in the 110s) related to ongoing cirrhosis, currently much lower in the 40sno recurrent episodes of  bleeding since admission -Daily CBC   Leukocytosis, resolved Suspect reactive in the setting of acute blood loss.  Remains afebrile. -Monitor CBC    Family Communication  : None  Code Status : Full code  Disposition Plan  :  Patient is from home. Anticipated d/c date: 2 to 3 days. Barriers to d/c or necessity for inpatient status:  Needs IV fluids for hypotension, still closely monitor hemoglobin and requiring IV Protonix given recent upper GI bleed, close monitoring of LFTs were contributing etiology, currently requiring Precedex and Ativan due to active alcohol withdrawal requiring close monitoring in the stepdown unit Consults  : GI  Procedures  : EGD, 5/17  DVT Prophylaxis  :  SCDs  Lab Results  Component Value Date   PLT 39 (L) 10/24/2019    Diet :  Diet Order            DIET SOFT Room service appropriate? Yes; Fluid consistency: Thin  Diet effective now               Inpatient Medications Scheduled Meds: . sodium chloride   Intravenous Once  . Chlorhexidine Gluconate Cloth  6 each Topical Daily  . folic acid  1 mg Oral Daily  . LORazepam  0-4 mg Intravenous Q6H  . multivitamin with minerals  1 tablet Oral Daily  . nicotine  21 mg Transdermal Daily  . thiamine  100 mg Intravenous Daily   Continuous Infusions: . sodium chloride 100 mL/hr at 10/24/19 1600  . cefTRIAXone (ROCEPHIN)  IV Stopped (10/24/19 1131)  . dexmedetomidine (PRECEDEX) IV infusion 0.7 mcg/kg/hr (10/24/19 1600)  . pantoprozole (PROTONIX) infusion 8 mg/hr (10/24/19 1600)  . sodium chloride     PRN Meds:.LORazepam **OR** LORazepam, morphine injection, ondansetron **OR** ondansetron (ZOFRAN) IV, oxyCODONE  Antibiotics  :   Anti-infectives (From admission, onward)   Start     Dose/Rate Route Frequency Ordered Stop   10/24/19 1030  cefTRIAXone (ROCEPHIN) 2 g in sodium chloride 0.9 % 100 mL IVPB     2 g 200 mL/hr over 30 Minutes Intravenous Daily 10/24/19 1004 10/31/19 0959        Objective   Vitals:   10/24/19 1445 10/24/19 1500 10/24/19 1600 10/24/19 1630  BP: (!) 101/58 (!) 104/57 (!) 95/50   Pulse: 80 80 72   Resp: 20 (!) 22 16   Temp:    97.6 F (36.4 C)  TempSrc:    Oral  SpO2: 94% 94% 92%   Weight:      Height:        SpO2: 92 % O2 Flow Rate (L/min): 2 L/min  Wt Readings from Last 3 Encounters:  10/23/19 56.2 kg  10/19/19 56.7 kg  10/09/19 56.2 kg     Intake/Output Summary (Last 24 hours) at 10/24/2019 1727 Last data filed at 10/24/2019 1600 Gross per 24 hour  Intake 2703.16 ml  Output 1800 ml  Net 903.16 ml    Physical Exam:    Awake, somnolent but easily arousable No acute deficits Tachycardic, no appreciable murmurs Abdomen soft, slightly tender in epigastric area without rebound tenderness or guarding No ascites No jaundice Awake Alert, Oriented X 3, Normal affect No new F.N deficits,  [email or web address],    I have personally reviewed the following:   Data Reviewed:  CBC Recent Labs  Lab  10/23/19 0251 10/23/19 1416 10/24/19 0420 10/24/19 1637  WBC 16.3* 8.3 3.7* 3.3*  HGB 6.1* 6.4* 8.0* 7.0*  HCT 17.9* 17.9* 23.6* 21.1*  PLT 117* 45* 43* 39*  MCV 86.1 84.0 87.7 88.3  MCH 29.3 30.0 29.7 29.3  MCHC 34.1 35.8 33.9 33.2  RDW 19.0* 16.4* 16.3* 17.1*  LYMPHSABS 1.7  --   --   --   MONOABS 1.4*  --   --   --   EOSABS 0.0  --   --   --   BASOSABS 0.0  --   --   --     Chemistries  Recent Labs  Lab 10/23/19 0251 10/24/19 0420  NA 133* 137  K 3.3* 3.5  CL 77* 98  CO2 23 30  GLUCOSE 127* 161*  BUN 47* 48*  CREATININE 1.61* 0.88  CALCIUM 8.9 6.6*  MG  --  1.9  AST 838* 723*  ALT 210* 182*  ALKPHOS 73 58  BILITOT 4.2* 3.8*   ------------------------------------------------------------------------------------------------------------------ No results for input(s): CHOL, HDL, LDLCALC, TRIG, CHOLHDL, LDLDIRECT in the last 72 hours.  Lab Results  Component Value Date   HGBA1C 4.6 (L) 10/09/2019    ------------------------------------------------------------------------------------------------------------------ No results for input(s): TSH, T4TOTAL, T3FREE, THYROIDAB in the last 72 hours.  Invalid input(s): FREET3 ------------------------------------------------------------------------------------------------------------------ No results for input(s): VITAMINB12, FOLATE, FERRITIN, TIBC, IRON, RETICCTPCT in the last 72 hours.  Coagulation profile Recent Labs  Lab 10/23/19 0251  INR 1.8*    No results for input(s): DDIMER in the last 72 hours.  Cardiac Enzymes No results for input(s): CKMB, TROPONINI, MYOGLOBIN in the last 168 hours.  Invalid input(s): CK ------------------------------------------------------------------------------------------------------------------ No results found for: BNP  Micro Results Recent Results (from the past 240 hour(s))  MRSA PCR Screening     Status: None   Collection Time: 10/23/19  2:44 AM   Specimen: Nasal Mucosa; Nasopharyngeal  Result Value Ref Range Status   MRSA by PCR NEGATIVE NEGATIVE Final    Comment:        The GeneXpert MRSA Assay (FDA approved for NASAL specimens only), is one component of a comprehensive MRSA colonization surveillance program. It is not intended to diagnose MRSA infection nor to guide or monitor treatment for MRSA infections. Performed at [facility], [address] [place], [place] [address]   SARS Coronavirus 2 by RT PCR (hospital order, performed in [facility] hospital lab) Nasopharyngeal Nasopharyngeal Swab     Status: None   Collection Time: 10/23/19  2:53 AM   Specimen: Nasopharyngeal Swab  Result Value Ref Range Status   SARS Coronavirus 2 NEGATIVE NEGATIVE Final    Comment: (NOTE) SARS-CoV-2 target nucleic acids are NOT DETECTED. The SARS-CoV-2 RNA is generally detectable in upper and lower respiratory specimens during the acute phase of infection. The lowest concentration of  SARS-CoV-2 viral copies this assay can detect is 250 copies / mL. A negative result does not preclude SARS-CoV-2 infection and should not be used as the sole basis for treatment or other patient management decisions.  A negative result may occur with improper specimen collection / handling, submission of specimen other than nasopharyngeal swab, presence of viral mutation(s) within the areas targeted by this assay, and inadequate number of viral copies (<250 copies / mL). A negative result must be combined with clinical observations, patient history, and epidemiological information. Fact Sheet for Patients:   StrictlyIdeas.no Fact Sheet for Healthcare Providers: BankingDealers.co.za This test is not yet approved or cleared  by the [place]  FDA and has been authorized for detection and/or diagnosis of SARS-CoV-2 by FDA under an Emergency Use Authorization (EUA).  This EUA will remain in effect (meaning this test can be used) for the duration of the COVID-19 declaration under Section 564(b)(1) of the Act, 21 U.S.C. section 360bbb-3(b)(1), unless the authorization is terminated or revoked sooner. Performed at [facility], [address] [place], [place] [address]     Radiology Reports DG Chest [place] 1 View  Result Date: 10/23/2019 CLINICAL DATA:  Bloody emesis EXAM: PORTABLE CHEST 1 VIEW COMPARISON:  September 03, 2019 FINDINGS: The heart size and mediastinal contours are within normal limits. Both lungs are clear. The visualized skeletal structures are unremarkable. IMPRESSION: No active disease. Electronically Signed   By: Prudencio Pair M.D.   On: 10/23/2019 03:12     Time Spent in minutes  30     Desiree Hane M.D on 10/24/2019 at 5:27 PM  To page go to [email or web address] - password [facility]

## 2019-10-24 NOTE — Progress Notes (Signed)
Pt is anxious and continues to complain of pain all over her body throughout shift despite pain medication. Pt drifts in and out of sleep. BPs on soft side, 104/57 at 1500. This RN does not feel comfortable administering pain medications at this time due to soft BP and pt drifting in and out of sleep. Voiding via purewic-adequate output. 2 L [place] placed for O2 desaturation while sleeping. IV lines flushed and intact. Ativan administered prn for withdrawal symptoms. No signs of bleeding noted. Will continue to monitor.

## 2019-10-24 NOTE — Progress Notes (Signed)
Rhonda Lame, MD [facility]   [address], [place], [place] [address] Phone: [phone] Fax : [phone]   Subjective: This patient is tolerating a clear liquid diet today.  She had an EGD yesterday with a bleeding lesion in the stomach.  It was treated with hemoclips.  The patient has had no further signs of bleeding and has no complaints.  She denies any abdominal pain.   Objective: Vital signs in last 24 hours: Vitals:   10/24/19 1000 10/24/19 1100 10/24/19 1130 10/24/19 1200  BP: (!) 98/53 (!) 109/55  (!) 103/52  Pulse: 78 78  78  Resp: 15 15    Temp:   97.9 F (36.6 C)   TempSrc:   Oral   SpO2: 95% 94%    Weight:      Height:       Weight change: 1.814 kg  Intake/Output Summary (Last 24 hours) at 10/24/2019 1317 Last data filed at 10/24/2019 1101 Gross per 24 hour  Intake 2490.96 ml  Output 650 ml  Net 1840.96 ml     Exam: Heart:: Regular rate and rhythm, S1S2 present or without murmur or extra heart sounds Lungs: normal and clear to auscultation and percussion Abdomen: soft, nontender, normal bowel sounds   Lab Results: @LABTEST2 @ Micro Results: Recent Results (from the past 240 hour(s))  MRSA PCR Screening     Status: None   Collection Time: 10/23/19  2:44 AM   Specimen: Nasal Mucosa; Nasopharyngeal  Result Value Ref Range Status   MRSA by PCR NEGATIVE NEGATIVE Final    Comment:        The GeneXpert MRSA Assay (FDA approved for NASAL specimens only), is one component of a comprehensive MRSA colonization surveillance program. It is not intended to diagnose MRSA infection nor to guide or monitor treatment for MRSA infections. Performed at [facility], [address] [place], [place] [address]   SARS Coronavirus 2 by RT PCR (hospital order, performed in [facility] hospital lab) Nasopharyngeal Nasopharyngeal Swab     Status: None   Collection Time: 10/23/19  2:53 AM   Specimen: Nasopharyngeal Swab  Result Value Ref Range Status     SARS Coronavirus 2 NEGATIVE NEGATIVE Final    Comment: (NOTE) SARS-CoV-2 target nucleic acids are NOT DETECTED. The SARS-CoV-2 RNA is generally detectable in upper and lower respiratory specimens during the acute phase of infection. The lowest concentration of SARS-CoV-2 viral copies this assay can detect is 250 copies / mL. A negative result does not preclude SARS-CoV-2 infection and should not be used as the sole basis for treatment or other patient management decisions.  A negative result may occur with improper specimen collection / handling, submission of specimen other than nasopharyngeal swab, presence of viral mutation(s) within the areas targeted by this assay, and inadequate number of viral copies (<250 copies / mL). A negative result must be combined with clinical observations, patient history, and epidemiological information. Fact Sheet for Patients:   StrictlyIdeas.no Fact Sheet for Healthcare Providers: BankingDealers.co.za This test is not yet approved or cleared  by the [place] FDA and has been authorized for detection and/or diagnosis of SARS-CoV-2 by FDA under an Emergency Use Authorization (EUA).  This EUA will remain in effect (meaning this test can be used) for the duration of the COVID-19 declaration under Section 564(b)(1) of the Act, 21 U.S.C. section 360bbb-3(b)(1), unless the authorization is terminated or revoked sooner. Performed at [facility], [address], [place], [place] [address]  Studies/Results: DG Chest Port 1 View  Result Date: 10/23/2019 CLINICAL DATA:  Bloody emesis EXAM: PORTABLE CHEST 1 VIEW COMPARISON:  September 03, 2019 FINDINGS: The heart size and mediastinal contours are within normal limits. Both lungs are clear. The visualized skeletal structures are unremarkable. IMPRESSION: No active disease. Electronically Signed   By: Prudencio Pair M.D.   On: 10/23/2019 03:12    Medications: I have reviewed the patient's current medications. Scheduled Meds: . Chlorhexidine Gluconate Cloth  6 each Topical Daily  . folic acid  1 mg Oral Daily  . LORazepam  0-4 mg Intravenous Q6H  . multivitamin with minerals  1 tablet Oral Daily  . nicotine  21 mg Transdermal Daily  . thiamine  100 mg Intravenous Daily   Continuous Infusions: . sodium chloride Stopped (10/24/19 1101)  . cefTRIAXone (ROCEPHIN)  IV 2 g (10/24/19 1101)  . dexmedetomidine (PRECEDEX) IV infusion 0.7 mcg/kg/hr (10/24/19 1101)  . octreotide  (SANDOSTATIN)    IV infusion 50 mcg/hr (10/24/19 1101)  . 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  There is no sign of any further bleeding.  The patient would like her diet advanced and I will start her on a soft diet.   LOS: 1 day   Rhonda Martin 10/24/2019, 1:17 PM Pager [phone] 7am-5pm  Check AMION for 5pm -7am coverage and on weekends

## 2019-10-24 NOTE — Progress Notes (Signed)
RN walked into room and pt had purse contents in bed including a pack of cigarettes. RN explained that [place] is a tobacco free campus and that RN had to place cigarette pack in medication bin until discharge, or that family could take them home when they visit. Pt understands. Cigarettes placed in pt medication bin.

## 2019-10-24 NOTE — Progress Notes (Signed)
Initial Nutrition Assessment  DOCUMENTATION CODES:   Not applicable  INTERVENTION:  Once diet is advanced provide Ensure Enlive po BID, each supplement provides 350 kcal and 20 grams of protein. Patient prefers chocolate.  Discussed increased calorie/protein needs. Encouraged adequate intake at meals.  NUTRITION DIAGNOSIS:   Increased nutrient needs related to catabolic illness(COPD, cirrhosis) as evidenced by estimated needs.  GOAL:   Patient will meet greater than or equal to 90% of their needs  MONITOR:   PO intake, Supplement acceptance, Diet advancement, Labs, Weight trends, I & O's  REASON FOR ASSESSMENT:   Malnutrition Screening Tool    ASSESSMENT:   44 year old female with PMHx of HTN, Charcot-Marie-Tooth disease, hepatitis C, cirrhosis, COPD, GERD, cervical cancer, iron deficiency anemia, anxiety, EtOH use admitted with hematemesis, acute on chronic anemia.   -Patient underwent EGD on 5/17 which found LA Grade C esophagitis with no bleeding, Dieulafoy lesion of stomach. -Diet was advanced to clear liquids following procedure.  Met with patient at bedside. RN present in room during nutrition assessment. Patient reports that PTA she had a good appetite and intake at baseline. She did later endorse that N/V starting 5/14 did decrease her appetite and intake PTA. She reports typically eating 2 meals per day and reports she eats adequate protein at meals. She also drinks Ensure daily. Patient had just finished 100% of her clear liquid breakfast tray at time of RD assessment. She reports she tolerated well and denied any nausea or abdominal pain. Discussed increased calorie/protein needs. Patient is amenable to drinking Ensure once diet is advanced. Patient with Charcot knee and difficulty ambulating which explains severe muscle wasting found in lower extremities.  Patient reports that her UBW is 124 lbs. She denies any unintentional weight loss.   Medications reviewed and  include: Ativan, nicotine, thiamine 100 m daily, NS at 100 mL/hr, Precedex gtt, octreotide, Protonix.  Labs reviewed: BUN 48.  Patient does not meet criteria for malnutrition at this time.  NUTRITION - FOCUSED PHYSICAL EXAM:    Most Recent Value  Orbital Region  No depletion  Upper Arm Region  No depletion  Thoracic and Lumbar Region  No depletion  Buccal Region  No depletion  Temple Region  No depletion  Clavicle Bone Region  Mild depletion  Clavicle and Acromion Bone Region  No depletion  Scapular Bone Region  No depletion  Dorsal Hand  No depletion  Patellar Region  Severe depletion  Anterior Thigh Region  Severe depletion  Posterior Calf Region  Severe depletion  Edema (RD Assessment)  -- [non-pitting]  Hair  Reviewed  Eyes  Reviewed  Mouth  Reviewed  Skin  Reviewed  Nails  Reviewed     Diet Order:   Diet Order            Diet clear liquid Room service appropriate? Yes; Fluid consistency: Thin  Diet effective now             EDUCATION NEEDS:   Education needs have been addressed  Skin:  Skin Assessment: Reviewed RN Assessment  Last BM:  Unknown  Height:   Ht Readings from Last 1 Encounters:  10/23/19 5' 2" (1.575 m)   Weight:   Wt Readings from Last 1 Encounters:  10/23/19 56.2 kg   BMI:  Body mass index is 22.68 kg/m.  Estimated Nutritional Needs:   Kcal:  1600-1800  Protein:  80-90 grams  Fluid:  1.6-1.8 L/day  Jacklynn Barnacle, MS, RD, LDN Pager number available on  Amion

## 2019-10-25 ENCOUNTER — Encounter: Payer: Self-pay | Admitting: *Deleted

## 2019-10-25 ENCOUNTER — Inpatient Hospital Stay: Payer: Medicaid Other

## 2019-10-25 DIAGNOSIS — K922 Gastrointestinal hemorrhage, unspecified: Secondary | ICD-10-CM

## 2019-10-25 DIAGNOSIS — E872 Acidosis: Secondary | ICD-10-CM

## 2019-10-25 DIAGNOSIS — K254 Chronic or unspecified gastric ulcer with hemorrhage: Secondary | ICD-10-CM

## 2019-10-25 DIAGNOSIS — N179 Acute kidney failure, unspecified: Secondary | ICD-10-CM

## 2019-10-25 LAB — COMPREHENSIVE METABOLIC PANEL
ALT: 150 U/L — ABNORMAL HIGH (ref 0–44)
AST: 361 U/L — ABNORMAL HIGH (ref 15–41)
Albumin: 2.5 g/dL — ABNORMAL LOW (ref 3.5–5.0)
Alkaline Phosphatase: 57 U/L (ref 38–126)
Anion gap: 6 (ref 5–15)
BUN: 21 mg/dL — ABNORMAL HIGH (ref 6–20)
CO2: 26 mmol/L (ref 22–32)
Calcium: 6.6 mg/dL — ABNORMAL LOW (ref 8.9–10.3)
Chloride: 106 mmol/L (ref 98–111)
Creatinine, Ser: 0.51 mg/dL (ref 0.44–1.00)
GFR calc Af Amer: >60 mL/min (ref >60)
GFR calc non Af Amer: >60 mL/min (ref >60)
Glucose, Bld: 123 mg/dL — ABNORMAL HIGH (ref 70–99)
Potassium: 3.4 mmol/L — ABNORMAL LOW (ref 3.5–5.1)
Sodium: 138 mmol/L (ref 135–145)
Total Bilirubin: 3.1 mg/dL — ABNORMAL HIGH (ref 0.3–1.2)
Total Protein: 5.2 g/dL — ABNORMAL LOW (ref 6.5–8.1)

## 2019-10-25 LAB — GLUCOSE, CAPILLARY: Glucose-Capillary: 124 mg/dL — ABNORMAL HIGH (ref 70–99)

## 2019-10-25 LAB — BPAM RBC
Blood Product Expiration Date: 202105292359
Blood Product Expiration Date: 202105292359
Blood Product Expiration Date: 202106022359
Blood Product Expiration Date: 202106062359
ISSUE DATE / TIME: 202105170421
ISSUE DATE / TIME: 202105170929
ISSUE DATE / TIME: 202105172118
ISSUE DATE / TIME: 202105181954
Unit Type and Rh: 6200
Unit Type and Rh: 6200
Unit Type and Rh: 6200
Unit Type and Rh: 6200

## 2019-10-25 LAB — CBC
HCT: 25.9 % — ABNORMAL LOW (ref 36.0–46.0)
Hemoglobin: 9.3 g/dL — ABNORMAL LOW (ref 12.0–15.0)
MCH: 30.8 pg (ref 26.0–34.0)
MCHC: 35.9 g/dL (ref 30.0–36.0)
MCV: 85.8 fL (ref 80.0–100.0)
Platelets: 43 10*3/uL — ABNORMAL LOW (ref 150–400)
RBC: 3.02 MIL/uL — ABNORMAL LOW (ref 3.87–5.11)
RDW: 16.8 % — ABNORMAL HIGH (ref 11.5–15.5)
WBC: 3.9 10*3/uL — ABNORMAL LOW (ref 4.0–10.5)
nRBC: 0 % (ref 0.0–0.2)

## 2019-10-25 LAB — TYPE AND SCREEN
ABO/RH(D): A POS
Antibody Screen: NEGATIVE
Unit division: 0
Unit division: 0
Unit division: 0
Unit division: 0

## 2019-10-25 LAB — PROTIME-INR
INR: 1.7 — ABNORMAL HIGH (ref 0.8–1.2)
Prothrombin Time: 19 seconds — ABNORMAL HIGH (ref 11.4–15.2)

## 2019-10-25 LAB — HEPATITIS PANEL, ACUTE
HCV Ab: REACTIVE — AB
Hep A IgM: NONREACTIVE
Hep B C IgM: REACTIVE — AB
Hepatitis B Surface Ag: NONREACTIVE

## 2019-10-25 LAB — MAGNESIUM: Magnesium: 1.8 mg/dL (ref 1.7–2.4)
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
## 2019-10-25 NOTE — Progress Notes (Signed)
Pts vitals have remained WNL throughout shift. Pt ctill on precedex drip, tried to wean off twice unsuccesfully (pt became agitated, emotional and anxious) Pt has been given prn pain med twice. No longer NPO. Good urine output, No BM. Pt slept a lot throughout shift currently resting comfortably in bed. No complaints of pain currently.

## 2019-10-26 ENCOUNTER — Inpatient Hospital Stay: Admission: RE | Admit: 2019-10-26 | Payer: Medicaid Other | Source: Home / Self Care | Admitting: Orthopedic Surgery

## 2019-10-26 ENCOUNTER — Other Ambulatory Visit: Payer: Self-pay

## 2019-10-26 ENCOUNTER — Encounter: Admission: RE | Payer: Self-pay | Source: Home / Self Care

## 2019-10-26 DIAGNOSIS — R7989 Other specified abnormal findings of blood chemistry: Secondary | ICD-10-CM

## 2019-10-26 LAB — COMPREHENSIVE METABOLIC PANEL
ALT: 125 U/L — ABNORMAL HIGH (ref 0–44)
AST: 217 U/L — ABNORMAL HIGH (ref 15–41)
Albumin: 2.3 g/dL — ABNORMAL LOW (ref 3.5–5.0)
Alkaline Phosphatase: 69 U/L (ref 38–126)
Anion gap: 4 — ABNORMAL LOW (ref 5–15)
BUN: 13 mg/dL (ref 6–20)
CO2: 22 mmol/L (ref 22–32)
Calcium: 6.6 mg/dL — ABNORMAL LOW (ref 8.9–10.3)
Chloride: 108 mmol/L (ref 98–111)
Creatinine, Ser: 0.55 mg/dL (ref 0.44–1.00)
GFR calc Af Amer: >60 mL/min (ref >60)
GFR calc non Af Amer: >60 mL/min (ref >60)
Glucose, Bld: 121 mg/dL — ABNORMAL HIGH (ref 70–99)
Potassium: 3.5 mmol/L (ref 3.5–5.1)
Sodium: 134 mmol/L — ABNORMAL LOW (ref 135–145)
Total Bilirubin: 2.4 mg/dL — ABNORMAL HIGH (ref 0.3–1.2)
Total Protein: 5.1 g/dL — ABNORMAL LOW (ref 6.5–8.1)

## 2019-10-26 LAB — CBC
HCT: 28.5 % — ABNORMAL LOW (ref 36.0–46.0)
Hemoglobin: 9.6 g/dL — ABNORMAL LOW (ref 12.0–15.0)
MCH: 29.7 pg (ref 26.0–34.0)
MCHC: 33.7 g/dL (ref 30.0–36.0)
MCV: 88.2 fL (ref 80.0–100.0)
Platelets: 52 10*3/uL — ABNORMAL LOW (ref 150–400)
RBC: 3.23 MIL/uL — ABNORMAL LOW (ref 3.87–5.11)
RDW: 17.9 % — ABNORMAL HIGH (ref 11.5–15.5)
WBC: 3.8 10*3/uL — ABNORMAL LOW (ref 4.0–10.5)
nRBC: 0 % (ref 0.0–0.2)

## 2019-10-26 LAB — HEPATITIS B DNA, ULTRAQUANTITATIVE, PCR
HBV DNA SERPL PCR-ACNC: NOT DETECTED IU/mL
HBV DNA SERPL PCR-LOG IU: UNDETERMINED log10 IU/mL

## 2019-10-26 LAB — PROTIME-INR
INR: 1.6 — ABNORMAL HIGH (ref 0.8–1.2)
Prothrombin Time: 18.6 seconds — ABNORMAL HIGH (ref 11.4–15.2)

## 2019-10-26 LAB — HEPATITIS B SURFACE ANTIBODY, QUANTITATIVE: Hep B S AB Quant (Post): 38.2 m[IU]/mL (ref >9.9)

## 2019-10-26 LAB — PREPARE RBC (CROSSMATCH)

## 2019-10-26 LAB — HEPATITIS B E ANTIGEN: Hep B E Ag: NEGATIVE

## 2019-10-26 SURGERY — ARTHROPLASTY, KNEE, TOTAL
Anesthesia: Choice | Site: Knee | Laterality: Right

## 2019-10-26 MED ORDER — PNEUMOCOCCAL VAC POLYVALENT 25 MCG/0.5ML IJ INJ: 0.5000 mL | INJECTION | INTRAMUSCULAR | Status: DC

## 2019-10-26 MED ORDER — DOCUSATE SODIUM 100 MG PO CAPS
100.0000 mg | ORAL_CAPSULE | Freq: Two times a day (BID) | ORAL | Status: DC
  Administered 2019-10-28 – 2019-11-02 (×10): 100 mg via ORAL
  Filled 2019-10-26 (×12): qty 1

## 2019-10-26 MED ORDER — PANTOPRAZOLE SODIUM 40 MG PO TBEC
40.0000 mg | DELAYED_RELEASE_TABLET | Freq: Two times a day (BID) | ORAL | Status: DC
  Administered 2019-10-26 – 2019-11-02 (×15): 40 mg via ORAL
  Filled 2019-10-26 (×15): qty 1

## 2019-10-26 MED ORDER — BISACODYL 10 MG RE SUPP
10.0000 mg | Freq: Once | RECTAL | Status: AC
  Administered 2019-10-26: 10 mg via RECTAL
  Filled 2019-10-26: qty 1

## 2019-10-26 MED ORDER — LACTULOSE 10 GM/15ML PO SOLN
20.0000 g | Freq: Two times a day (BID) | ORAL | Status: DC
  Administered 2019-10-26 – 2019-11-01 (×4): 20 g via ORAL
  Filled 2019-10-26 (×10): qty 30

## 2019-10-26 MED ORDER — DICYCLOMINE HCL 20 MG PO TABS
20.0000 mg | ORAL_TABLET | Freq: Four times a day (QID) | ORAL | Status: DC | PRN
  Administered 2019-10-26 – 2019-10-29 (×3): 20 mg via ORAL
  Filled 2019-10-26 (×5): qty 1
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
## 2019-10-26 NOTE — Progress Notes (Signed)
Rhonda Lame, MD [facility]   [address], [place], [place] [address] Phone: [phone] Fax : [phone]   Subjective: The patient is presently having abdominal pain and she states that her abdomen feels hard but attributes it to eating too much this morning.  The patient was admitted with vomiting blood and her hemoglobin is stable at this point with her hemoglobin of 9.6 this morning.  She is having issues with alcohol withdrawal and her liver enzymes have been elevated.  The liver enzymes have started to come down. Her blood work showed her to have a hepatitis B surface antibody positive with a negative hepatitis B antigen but a positive hepatitis B core antibody IgM.  The patient surface antibody was nonreactive.  She also has a positive hepatitis C.   Objective: Vital signs in last 24 hours: Vitals:   10/26/19 0900 10/26/19 1000 10/26/19 1200 10/26/19 1300  BP: 120/69 104/72 (!) 142/85 121/63  Pulse: 93 97 97   Resp:      Temp:   98.4 F (36.9 C)   TempSrc:   Oral   SpO2: 98% 96% 96%   Weight:      Height:       Weight change:   Intake/Output Summary (Last 24 hours) at 10/26/2019 1329 Last data filed at 10/26/2019 0904 Gross per 24 hour  Intake 2890.75 ml  Output 1650 ml  Net 1240.75 ml     Exam: Heart:: Without any murmurs rubs or gallop Lungs: normal and clear to auscultation and percussion Abdomen: Soft mildly tender diffusely without rebound without guarding.   Lab Results: @LABTEST2 @ Micro Results: Recent Results (from the past 240 hour(s))  MRSA PCR Screening     Status: None   Collection Time: 10/23/19  2:44 AM   Specimen: Nasal Mucosa; Nasopharyngeal  Result Value Ref Range Status   MRSA by PCR NEGATIVE NEGATIVE Final    Comment:        The GeneXpert MRSA Assay (FDA approved for NASAL specimens only), is one component of a comprehensive MRSA colonization surveillance program. It is not intended to diagnose MRSA infection nor to guide  or monitor treatment for MRSA infections. Performed at [facility], [address] [place], [place] [address]   SARS Coronavirus 2 by RT PCR (hospital order, performed in [facility] hospital lab) Nasopharyngeal Nasopharyngeal Swab     Status: None   Collection Time: 10/23/19  2:53 AM   Specimen: Nasopharyngeal Swab  Result Value Ref Range Status   SARS Coronavirus 2 NEGATIVE NEGATIVE Final    Comment: (NOTE) SARS-CoV-2 target nucleic acids are NOT DETECTED. The SARS-CoV-2 RNA is generally detectable in upper and lower respiratory specimens during the acute phase of infection. The lowest concentration of SARS-CoV-2 viral copies this assay can detect is 250 copies / mL. A negative result does not preclude SARS-CoV-2 infection and should not be used as the sole basis for treatment or other patient management decisions.  A negative result may occur with improper specimen collection / handling, submission of specimen other than nasopharyngeal swab, presence of viral mutation(s) within the areas targeted by this assay, and inadequate number of viral copies (<250 copies / mL). A negative result must be combined with clinical observations, patient history, and epidemiological information. Fact Sheet for Patients:   StrictlyIdeas.no Fact Sheet for Healthcare Providers: BankingDealers.co.za This test is not yet approved or cleared  by the [place] FDA and has been authorized for detection and/or diagnosis of SARS-CoV-2 by FDA  under an Emergency Use Authorization (EUA).  This EUA will remain in effect (meaning this test can be used) for the duration of the COVID-19 declaration under Section 564(b)(1) of the Act, 21 U.S.C. section 360bbb-3(b)(1), unless the authorization is terminated or revoked sooner. Performed at [facility], [address], [place], [place] [address]    Studies/Results: US Abdomen Limited  RUQ  Result Date: 10/25/2019 CLINICAL DATA:  Elevated liver enzymes.  History of hepatitis-C EXAM: ULTRASOUND ABDOMEN LIMITED RIGHT UPPER QUADRANT COMPARISON:  February 06, 2019 FINDINGS: Gallbladder: Within the gallbladder, there are echogenic foci which move and shadow consistent with cholelithiasis. Largest gallstone measures 8 mm in length. No gallbladder wall thickening or pericholecystic fluid. No sonographic Murphy sign noted by sonographer. Common bile duct: Diameter: 5 mm. No intrahepatic or extrahepatic biliary duct dilatation. Liver: No focal lesion identified. Liver echogenicity is mildly coarsened and overall increased diffusely. Portal vein is patent on color Doppler imaging with normal direction of blood flow towards the liver. Other: None. IMPRESSION: 1. Cholelithiasis. No gallbladder wall thickening or pericholecystic fluid. 2. Liver echogenicity is somewhat coarsened and overall increased diffusely, an appearance indicative of hepatic steatosis with potential underlying parenchymal liver disease. While no focal liver lesions are evident on this study, it must be cautioned that the sensitivity of ultrasound for detection of focal liver lesions is diminished significantly in this circumstance. Electronically Signed   By: Lowella Grip III M.D.   On: 10/25/2019 10:12   Medications: I have reviewed the patient's current medications. Scheduled Meds: . sodium chloride   Intravenous Once  . Chlorhexidine Gluconate Cloth  6 each Topical Daily  . folic acid  1 mg Oral Daily  . multivitamin with minerals  1 tablet Oral Daily  . nicotine  21 mg Transdermal Daily  . pantoprazole  40 mg Oral BID  . [START ON 10/27/2019] pneumococcal 23 valent vaccine  0.5 mL Intramuscular Tomorrow-1000  . thiamine  100 mg Intravenous Daily   Continuous Infusions: . cefTRIAXone (ROCEPHIN)  IV 2 g (10/26/19 0930)  . 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  By definition reactivation would be a conversion from hepatitis B surface antigen negative to hepatitis B surface antigen positive or a detectable viral load after resolving hepatitis B.  The viral load is pending at this time. The patient should continue with conservative management and should avoid further alcohol abuse.   LOS: 3 days   Rhonda Martin 10/26/2019, 1:29 PM Pager [phone] 7am-5pm  Check AMION for 5pm -7am coverage and on weekends

## 2019-10-26 NOTE — Progress Notes (Signed)
PROGRESS NOTE    Rhonda Martin  [id number] DOB: 10-13-1975 DOA: 10/23/2019 PCP: Volney American, PA-C   Brief Narrative: [name] is a 44 yo female with medical history significant for alcoholic cirrhosis, alcohol abuse,hepatitis C,, hypertension, COPD, GERD, anxiety, iron deficiency anemia, cervical cancer, tobacco abuse, thrombocytopeniaWho presented with several episodes of emesis of bright red blood over the past 2 days associated with epigastric pain. On EMS evaluation patient was found to have a systolic blood pressure initially in the 80s and reportedly came down to the 40s.  Initially treated with IV fluid, IV octreotide and IV Protonix along with 2 units of PRBC. 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   No variceal GI bleed.  No recurrent hematemesis.  Subjective: Patient was complaining of abdominal crampy pain after eating breakfast.  No BM yet and asking for a stool softener.  Precedex infusion was turned off couple of hours ago, she seems little agitated.  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 No esophageal varices on EGD.  No recurrent bleeding. Hemoglobin at 9.6 today. GI is following-appreciate their recommendations. -We will discontinue Protonix infusion and start her on twice daily Protonix. -Continue monitoring CBC.  Abdominal pain.  Might be due to constipation. -Add regular bowel  regimen.  Acute on chronic anemia in setting of acute blood loss related to above. -Patient received 2 units of PRBCs.  Hemoglobin stable at 9.6 this morning. -Continue to monitor-transfuse if below 8.  Hypotension.  Most likely secondary to acute blood loss.  Resolved. -Continue to monitor.  History of alcohol abuse with concern for withdrawal.  Patient was started on Precedex infusion due to worsening agitation and confusion.  Per patient she does not drink regularly but her last drink was couple of days ago. Precedex was weaned off earlier this morning, patient seems little agitated. -Continue to monitor with CIWA protocol. -We will try to avoid Precedex if possible, Ativan protocol is in place, Precedex can be restarted if needed.  Alcoholic liver cirrhosis.  Child pugh class C, with a score of 11.  Elevated LFTs with hyperbilirubinemia most likely multifactorial with a combination of shock liver along with alcoholic hepatitis and hepatitis C.  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 Right upper quadrant ultrasound with cholelithiasis. -Continue supportive care.  Acute on chronic thrombocytopenia.  Platelet noted improving,  52 today. Baseline around 110.  No active bleeding. -Continue to monitor.  Concern of threatening environment at home.  See social worker's note for details. -Social workers are consulting adult and child protective services.  Bilateral knee pain.  Per patient she has an history of Charcot  Marvene tooth disease. Being managed by orthopedic as an outpatient. -Continue pain management.  Objective: Vitals:   10/26/19 1000 10/26/19 1200 10/26/19 1300 10/26/19 1400  BP: 104/72 (!) 142/85 121/63  136/61  Pulse: 97 97 (!) 109 (!) 104  Resp:   14 12  Temp:  98.4 F (36.9 C)  98.8 F (37.1 C)  TempSrc:  Oral  Oral  SpO2: 96% 96% 95% 93%  Weight:       Height:        Intake/Output Summary (Last 24 hours) at 10/26/2019 1508 Last data filed at 10/26/2019 [id number] Gross per 24 hour  Intake 2242.81 ml  Output 950 ml  Net 1292.81 ml   Filed Weights   10/23/19 0248 10/23/19 1459  Weight: 54.4 kg 56.2 kg    Examination:  General exam: Frail lady, appears calm and comfortable.  Respiratory system: Clear to auscultation. Respiratory effort normal. Cardiovascular system: S1 & S2 heard, RRR. No JVD, murmurs, rubs, Gastrointestinal system: Soft, mild diffuse tenderness, nondistended, bowel sounds positive. Central nervous system: Alert and oriented. No focal neurological deficits. Extremities: No edema, no cyanosis, pulses intact and symmetrical. Psychiatry: Judgement and insight appear normal.    DVT prophylaxis: SCDs. Code Status: Full Family Communication: Discussed with patient Disposition Plan:  Status is: Inpatient  Remains inpatient appropriate because:Inpatient level of care appropriate due to severity of illness   Dispo: The patient is from: Home              Anticipated d/c is to: To be determined              Anticipated d/c date is: 1-2 days.              Patient currently is not medically stable to d/c.  Patient does not feel safe at home.  Social worker are trying to contact APS.   Consultants:   GI  Procedures:  EGD-10/23/2019  Antimicrobials:  Ceftriaxone.  Data Reviewed: I have personally reviewed following labs and imaging studies  CBC: Recent Labs  Lab 10/23/19 0251 10/23/19 0251 10/23/19 1416 10/24/19 0420 10/24/19 1637 10/25/19 0439 10/26/19 0437  WBC 16.3*   < > 8.3 3.7* 3.3* 3.9* 3.8*  NEUTROABS 13.1*  --   --   --   --   --   --   HGB 6.1*   < > 6.4* 8.0* 7.0* 9.3* 9.6*  HCT 17.9*   < > 17.9* 23.6* 21.1* 25.9* 28.5*  MCV 86.1   < > 84.0 87.7 88.3 85.8 88.2  PLT 117*   < > 45* 43* 39* 43* 52*   < > = values in this interval not displayed.   Basic Metabolic Panel: Recent Labs  Lab  10/23/19 0251 10/24/19 0420 10/25/19 0439 10/26/19 0437  NA 133* 137 138 134*  K 3.3* 3.5 3.4* 3.5  CL 77* 98 106 108  CO2 23 30 26 22   GLUCOSE 127* 161* 123* 121*  BUN 47* 48* 21* 13  CREATININE 1.61* 0.88 0.51 0.55  CALCIUM 8.9 6.6* 6.6* 6.6*  MG  --  1.9 1.8  --   PHOS  --  4.7*  --   --    GFR: Estimated Creatinine Clearance: 71.7 mL/min (by C-G formula based on SCr of 0.55 mg/dL). Liver Function Tests: Recent Labs  Lab 10/23/19 0251 10/24/19 0420 10/25/19 0439 10/26/19 0437  AST 838* 723* 361* 217*  ALT 210* 182* 150* 125*  ALKPHOS 73 58 57 69  BILITOT 4.2* 3.8* 3.1* 2.4*  PROT 6.0* 5.4* 5.2* 5.1*  ALBUMIN 2.8* 2.6* 2.5* 2.3*   Recent Labs  Lab 10/23/19 0251  LIPASE 34   Recent Labs  Lab 10/23/19 0252  AMMONIA 19   Coagulation Profile: Recent Labs  Lab 10/23/19 0251 10/24/19 1819 10/25/19 0439 10/26/19 0437  INR 1.8* 1.8* 1.7* 1.6*   Cardiac Enzymes: No results for input(s): CKTOTAL, CKMB, CKMBINDEX, TROPONINI in the last 168 hours. BNP (last 3 results) No results for input(s): PROBNP in the last 8760 hours. HbA1C: No results for input(s): HGBA1C in the last 72 hours. CBG: Recent Labs  Lab 10/25/19 1946  GLUCAP 124*   Lipid Profile: No results for input(s): CHOL, HDL, LDLCALC, TRIG, CHOLHDL, LDLDIRECT in the last 72 hours. Thyroid Function Tests: No results for input(s): TSH, T4TOTAL, FREET4, T3FREE, THYROIDAB in the last 72 hours. Anemia Panel: No results for input(s): VITAMINB12, FOLATE, FERRITIN, TIBC, IRON, RETICCTPCT in the last 72 hours. Sepsis Labs: Recent Labs  Lab 10/23/19 0252 10/23/19 0431 10/23/19 1635 10/23/19 1756  LATICACIDVEN >11.0* >11.0* 1.4 1.3    Recent Results (from the past 240 hour(s))  MRSA PCR Screening     Status: None   Collection Time: 10/23/19  2:44 AM   Specimen: Nasal Mucosa; Nasopharyngeal  Result Value Ref Range Status   MRSA by PCR NEGATIVE NEGATIVE Final    Comment:        The GeneXpert MRSA  Assay (FDA approved for NASAL specimens only), is one component of a comprehensive MRSA colonization surveillance program. It is not intended to diagnose MRSA infection nor to guide or monitor treatment for MRSA infections. Performed at [facility], [address] [place], [place] [address]   SARS Coronavirus 2 by RT PCR (hospital order, performed in [facility] hospital lab) Nasopharyngeal Nasopharyngeal Swab     Status: None   Collection Time: 10/23/19  2:53 AM   Specimen: Nasopharyngeal Swab  Result Value Ref Range Status   SARS Coronavirus 2 NEGATIVE NEGATIVE Final    Comment: (NOTE) SARS-CoV-2 target nucleic acids are NOT DETECTED. The SARS-CoV-2 RNA is generally detectable in upper and lower respiratory specimens during the acute phase of infection. The lowest concentration of SARS-CoV-2 viral copies this assay can detect is 250 copies / mL. A negative result does not preclude SARS-CoV-2 infection and should not be used as the sole basis for treatment or other patient management decisions.  A negative result may occur with improper specimen collection / handling, submission of specimen other than nasopharyngeal swab, presence of viral mutation(s) within the areas targeted by this assay, and inadequate number of viral copies (<250 copies / mL). A negative result must be combined with clinical observations, patient history, and epidemiological information. Fact Sheet for Patients:   StrictlyIdeas.no Fact Sheet for Healthcare Providers: BankingDealers.co.za This test is not yet approved or cleared  by the [place] FDA and has been authorized for detection and/or diagnosis of SARS-CoV-2 by FDA under an Emergency Use Authorization (EUA).  This EUA will remain in effect (meaning this test can be used) for the duration of the COVID-19 declaration under Section 564(b)(1) of the Act, 21 U.S.C. section 360bbb-3(b)(1),  unless the authorization is terminated or revoked sooner. Performed at [facility], [address] [place], [place] [address]      Radiology Studies: US Abdomen Limited RUQ  Result Date: 10/25/2019 CLINICAL DATA:  Elevated liver enzymes.  History of hepatitis-C EXAM: ULTRASOUND ABDOMEN LIMITED RIGHT UPPER QUADRANT COMPARISON:  February 06, 2019 FINDINGS: Gallbladder: Within the gallbladder, there are echogenic foci which move and shadow consistent with cholelithiasis. Largest gallstone measures 8 mm in length. No gallbladder wall thickening or pericholecystic fluid. No sonographic Percell Miller  sign noted by sonographer. Common bile duct: Diameter: 5 mm. No intrahepatic or extrahepatic biliary duct dilatation. Liver: No focal lesion identified. Liver echogenicity is mildly coarsened and overall increased diffusely. Portal vein is patent on color Doppler imaging with normal direction of blood flow towards the liver. Other: None. IMPRESSION: 1. Cholelithiasis. No gallbladder wall thickening or pericholecystic fluid. 2. Liver echogenicity is somewhat coarsened and overall increased diffusely, an appearance indicative of hepatic steatosis with potential underlying parenchymal liver disease. While no focal liver lesions are evident on this study, it must be cautioned that the sensitivity of ultrasound for detection of focal liver lesions is diminished significantly in this circumstance. Electronically Signed   By: Lowella Grip III M.D.   On: 10/25/2019 10:12    Scheduled Meds: . sodium chloride   Intravenous Once  . Chlorhexidine Gluconate Cloth  6 each Topical Daily  . folic acid  1 mg Oral Daily  . multivitamin with minerals  1 tablet Oral Daily  . nicotine  21 mg Transdermal Daily  . pantoprazole  40 mg Oral BID  . [START ON 10/27/2019] pneumococcal 23 valent vaccine  0.5 mL Intramuscular Tomorrow-1000  . thiamine  100 mg Intravenous Daily   Continuous Infusions: . cefTRIAXone (ROCEPHIN)   IV 2 g (10/26/19 0930)  . dexmedetomidine (PRECEDEX) IV infusion Stopped (10/26/19 0820)     LOS: 3 days   Time spent: 40 minutes.Lorella Nimrod, MD [facility]  If 7PM-7AM, please contact night-coverage [email or web address]  10/26/2019, 3:08 PM   This record has been created using Systems analyst. Errors have been sought and corrected,but may not always be located. Such creation errors do not reflect on the standard of care.

## 2019-10-27 DIAGNOSIS — F10239 Alcohol dependence with withdrawal, unspecified: Secondary | ICD-10-CM

## 2019-10-27 LAB — COMPREHENSIVE METABOLIC PANEL
ALT: 94 U/L — ABNORMAL HIGH (ref 0–44)
AST: 134 U/L — ABNORMAL HIGH (ref 15–41)
Albumin: 2.2 g/dL — ABNORMAL LOW (ref 3.5–5.0)
Alkaline Phosphatase: 95 U/L (ref 38–126)
Anion gap: 5 (ref 5–15)
BUN: 8 mg/dL (ref 6–20)
CO2: 24 mmol/L (ref 22–32)
Calcium: 7 mg/dL — ABNORMAL LOW (ref 8.9–10.3)
Chloride: 107 mmol/L (ref 98–111)
Creatinine, Ser: <0.3 mg/dL — ABNORMAL LOW (ref 0.44–1.00)
Glucose, Bld: 125 mg/dL — ABNORMAL HIGH (ref 70–99)
Potassium: 3.1 mmol/L — ABNORMAL LOW (ref 3.5–5.1)
Sodium: 136 mmol/L (ref 135–145)
Total Bilirubin: 2.8 mg/dL — ABNORMAL HIGH (ref 0.3–1.2)
Total Protein: 5.1 g/dL — ABNORMAL LOW (ref 6.5–8.1)

## 2019-10-27 LAB — CBC
HCT: 26.3 % — ABNORMAL LOW (ref 36.0–46.0)
Hemoglobin: 8.8 g/dL — ABNORMAL LOW (ref 12.0–15.0)
MCH: 29.9 pg (ref 26.0–34.0)
MCHC: 33.5 g/dL (ref 30.0–36.0)
MCV: 89.5 fL (ref 80.0–100.0)
Platelets: 55 10*3/uL — ABNORMAL LOW (ref 150–400)
RBC: 2.94 MIL/uL — ABNORMAL LOW (ref 3.87–5.11)
RDW: 17.3 % — ABNORMAL HIGH (ref 11.5–15.5)
WBC: 5 10*3/uL (ref 4.0–10.5)
nRBC: 0 % (ref 0.0–0.2)

## 2019-10-27 LAB — HCV RNA BY PCR, QN RFX GENO
HCV RNA Qnt(log copy/mL): UNDETERMINED log10 IU/mL
HepC Qn: NOT DETECTED IU/mL

## 2019-10-27 LAB — MAGNESIUM: Magnesium: 1.6 mg/dL — ABNORMAL LOW (ref 1.7–2.4)

## 2019-10-27 LAB — PROTIME-INR
INR: 1.7 — ABNORMAL HIGH (ref 0.8–1.2)
Prothrombin Time: 19.3 seconds — ABNORMAL HIGH (ref 11.4–15.2)

## 2019-10-27 MED ORDER — KETOROLAC TROMETHAMINE 15 MG/ML IJ SOLN
15.0000 mg | Freq: Four times a day (QID) | INTRAMUSCULAR | Status: AC | PRN
  Administered 2019-10-28 – 2019-10-30 (×3): 15 mg via INTRAVENOUS
  Filled 2019-10-27 (×7): qty 1

## 2019-10-27 MED ORDER — MORPHINE SULFATE (PF) 2 MG/ML IV SOLN
2.0000 mg | INTRAVENOUS | Status: DC | PRN
  Administered 2019-10-29 – 2019-11-01 (×11): 2 mg via INTRAVENOUS
  Filled 2019-10-27 (×11): qty 1

## 2019-10-27 MED ORDER — POTASSIUM CHLORIDE CRYS ER 20 MEQ PO TBCR
40.0000 meq | EXTENDED_RELEASE_TABLET | ORAL | Status: AC
  Administered 2019-10-27 (×2): 40 meq via ORAL
  Filled 2019-10-27 (×2): qty 2

## 2019-10-27 NOTE — Progress Notes (Signed)
Pt requesting more pain medicine.  States her pain is unbearable.  Pt cannot hold her eyes open and her speech is slurred. She states she cannot stand the pain.  And insisting on needing more oxy or ativan.  I refused to medicate at this time.  Helped patient reposition, closed door and curtain at her request so she can sleep.

## 2019-10-27 NOTE — Progress Notes (Signed)
Rhonda Lame, MD [facility]   [address] [place], [place] [address] Phone: [phone] Fax : [phone]   Subjective: The patient is sitting up in bed semilethargic reporting that she needs more pain medication.  She reports that she has abdominal pain.  She has had no further sign of any GI bleeding.  The patient had a EGD with a clip placed on a bleeding vessel.   Objective: Vital signs in last 24 hours: Vitals:   10/27/19 1100 10/27/19 1200 10/27/19 1300 10/27/19 1405  BP: 124/62 136/63 (!) 152/98 111/72  Pulse:    (!) 109  Resp: 20 15 14 16   Temp:    99.5 F (37.5 C)  TempSrc:    Oral  SpO2:    95%  Weight:      Height:       Weight change:   Intake/Output Summary (Last 24 hours) at 10/27/2019 1426 Last data filed at 10/27/2019 1406 Gross per 24 hour  Intake 460 ml  Output 1375 ml  Net -915 ml     Exam: Heart:: Tachycardia S1S2 present or without murmur or extra heart sounds Lungs: normal and clear to auscultation and percussion Abdomen: soft, nontender, normal bowel sounds   Lab Results: @LABTEST2 @ Micro Results: Recent Results (from the past 240 hour(s))  MRSA PCR Screening     Status: None   Collection Time: 10/23/19  2:44 AM   Specimen: Nasal Mucosa; Nasopharyngeal  Result Value Ref Range Status   MRSA by PCR NEGATIVE NEGATIVE Final    Comment:        The GeneXpert MRSA Assay (FDA approved for NASAL specimens only), is one component of a comprehensive MRSA colonization surveillance program. It is not intended to diagnose MRSA infection nor to guide or monitor treatment for MRSA infections. Performed at [facility], [address] [place], [place] [address]   SARS Coronavirus 2 by RT PCR (hospital order, performed in [facility] hospital lab) Nasopharyngeal Nasopharyngeal Swab     Status: None   Collection Time: 10/23/19  2:53 AM   Specimen: Nasopharyngeal Swab  Result Value Ref Range Status   SARS Coronavirus 2 NEGATIVE  NEGATIVE Final    Comment: (NOTE) SARS-CoV-2 target nucleic acids are NOT DETECTED. The SARS-CoV-2 RNA is generally detectable in upper and lower respiratory specimens during the acute phase of infection. The lowest concentration of SARS-CoV-2 viral copies this assay can detect is 250 copies / mL. A negative result does not preclude SARS-CoV-2 infection and should not be used as the sole basis for treatment or other patient management decisions.  A negative result may occur with improper specimen collection / handling, submission of specimen other than nasopharyngeal swab, presence of viral mutation(s) within the areas targeted by this assay, and inadequate number of viral copies (<250 copies / mL). A negative result must be combined with clinical observations, patient history, and epidemiological information. Fact Sheet for Patients:   StrictlyIdeas.no Fact Sheet for Healthcare Providers: BankingDealers.co.za This test is not yet approved or cleared  by the [place] FDA and has been authorized for detection and/or diagnosis of SARS-CoV-2 by FDA under an Emergency Use Authorization (EUA).  This EUA will remain in effect (meaning this test can be used) for the duration of the COVID-19 declaration under Section 564(b)(1) of the Act, 21 U.S.C. section 360bbb-3(b)(1), unless the authorization is terminated or revoked sooner. Performed at [facility], [address], [place],  [address]    Studies/Results: No results found.  Medications: I have reviewed the patient's current medications. Scheduled Meds: . sodium chloride   Intravenous Once  . Chlorhexidine Gluconate Cloth  6 each Topical Daily  . docusate sodium  100 mg Oral BID  . folic acid  1 mg Oral Daily  . lactulose  20 g Oral BID  . multivitamin with minerals  1 tablet Oral Daily  . nicotine  21 mg Transdermal Daily  . pantoprazole  40 mg Oral BID  .  pneumococcal 23 valent vaccine  0.5 mL Intramuscular Tomorrow-1000  . thiamine  100 mg Intravenous Daily   Continuous Infusions: . 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  Patient has a long history of alcohol abuse and hepatitis C.  She also has thrombocytopenia and likely advanced liver disease.  The patient had a negative hepatitis B DNA indicating no chronic infection with hepatitis B.  Her hepatitis C was also not detected.  Nothing further to recommend from a GI point of view except for DVT prophylaxis and abstinence from alcohol.   LOS: 4 days   Rhonda Martin 10/27/2019, 2:26 PM Pager [phone] 7am-5pm  Check AMION for 5pm -7am coverage and on weekends

## 2019-10-27 NOTE — Progress Notes (Signed)
APS worker Rhonda Martin saw patient and will follow case.  Please call prior to patients dc home  [phone]

## 2019-10-27 NOTE — Progress Notes (Signed)
PROGRESS NOTE    Rhonda Martin  [id number] DOB: [birth year] DOA: 10/23/2019 PCP: Volney American, PA-C   Brief Narrative: Rhonda Martin is a 44 yo female with medical history significant for alcoholic cirrhosis, alcohol abuse,hepatitis C,, hypertension, COPD, GERD, anxiety, iron deficiency anemia, cervical cancer, tobacco abuse, thrombocytopeniaWho presented with several episodes of emesis of bright red blood over the past 2 days associated with epigastric pain. On EMS evaluation patient was found to have a systolic blood pressure initially in the 80s and reportedly came down to the 40s.  Initially treated with IV fluid, IV octreotide and IV Protonix along with 2 units of PRBC. 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   No variceal GI bleed.  No recurrent hematemesis.  Subjective: Patient continue to complain about pain in abdomen and limbs.  Per nursing staff she is asking more and more pain meds, concern for drug-seeking behavior.  Appears lethargic.  Whenever alone she seems comfortable.  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 No esophageal varices on EGD.  No recurrent bleeding. Hemoglobin at 8.8 today. GI is following-appreciate their recommendations. -Continue twice daily Protonix. -Continue monitoring CBC.  Abdominal pain.  Patient continued to complain about pain, had a good bowel movement yesterday. -Continue  with regular bowel regimen.  Acute on chronic anemia in setting of acute blood loss related to above. -Patient received 2 units of PRBCs.  Hemoglobin stable at 9.6 this morning. -Continue to monitor-transfuse if below 8.  Hypotension.  Most likely secondary to acute blood loss.  Resolved. -Continue to monitor.  History of alcohol abuse with concern for withdrawal.  Patient was started on Precedex infusion due to worsening agitation and confusion.  Per patient she does not drink regularly but her last drink was couple of days ago. Precedex was weaned off, patient seems little agitated continue to ask for more pain meds and Ativan. -Continue to monitor with CIWA protocol. -We will try to avoid Precedex if possible, Ativan protocol is in place, Precedex can be restarted if needed.  Alcoholic liver cirrhosis.  Child pugh class C, with a score of 11.  Elevated LFTs with hyperbilirubinemia most likely multifactorial with a combination of shock liver along with alcoholic hepatitis and hepatitis C.  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 Right upper quadrant ultrasound with cholelithiasis. -Continue supportive care.  Acute on chronic thrombocytopenia.  Platelet noted improving,  55 today. Baseline around 110.  No active bleeding. -Continue to monitor.  Concern of threatening environment at home.  See social worker's note for details. -Social workers are consulting adult and child protective services. -We will obtain psych consult for underlying psych issues.  Patient is showing drug-seeking behavior.  Bilateral knee pain. Being managed by orthopedic as an outpatient. -Continue pain management.  There is some concern of drug-seeking behavior as she  continued to ask for more pain meds and appears lethargic. -We will try Toradol before opioids.  Objective: Vitals:   10/27/19 1100  10/27/19 1200 10/27/19 1300 10/27/19 1405  BP: 124/62 136/63 (!) 152/98 111/72  Pulse:    (!) 109  Resp: 20 15 14 16   Temp:    99.5 F (37.5 C)  TempSrc:    Oral  SpO2:    95%  Weight:      Height:        Intake/Output Summary (Last 24 hours) at 10/27/2019 1406 Last data filed at 10/27/2019 1027 Gross per 24 hour  Intake 460 ml  Output 1375 ml  Net -915 ml   Filed Weights   10/23/19 0248 10/23/19 1459  Weight: 54.4 kg 56.2 kg    Examination:  General exam: Frail lady, appears calm and comfortable.  Respiratory system: Clear to auscultation. Respiratory effort normal. Cardiovascular system: S1 & S2 heard, RRR. No JVD, murmurs, rubs, Gastrointestinal system: Soft, mild diffuse tenderness, nondistended, bowel sounds positive. Central nervous system: Alert and oriented. No focal neurological deficits. Extremities: No edema, no cyanosis, pulses intact and symmetrical. Psychiatry: Judgement and insight appear normal.    DVT prophylaxis: SCDs. Code Status: Full Family Communication: Discussed with patient Disposition Plan:  Status is: Inpatient  Remains inpatient appropriate because:Inpatient level of care appropriate due to severity of illness   Dispo: The patient is from: Home              Anticipated d/c is to: To be determined              Anticipated d/c date is: 1-2 days.              Patient currently is not medically stable to d/c.  Patient does not feel safe at home.  Social worker are trying to contact APS.  Also involved psychiatry to determine underlying psych issues.  Appears to have drug-seeking behavior.   Consultants:   GI  Psychiatry  Procedures:  EGD-10/23/2019  Antimicrobials:  Ceftriaxone.  Data Reviewed: I have personally reviewed following labs and imaging studies  CBC: Recent Labs  Lab 10/23/19 0251 10/23/19 1416 10/24/19 0420 10/24/19 1637 10/25/19 0439 10/26/19 0437 10/27/19 0555  WBC 16.3*   < > 3.7* 3.3* 3.9* 3.8* 5.0   NEUTROABS 13.1*  --   --   --   --   --   --   HGB 6.1*   < > 8.0* 7.0* 9.3* 9.6* 8.8*  HCT 17.9*   < > 23.6* 21.1* 25.9* 28.5* 26.3*  MCV 86.1   < > 87.7 88.3 85.8 88.2 89.5  PLT 117*   < > 43* 39* 43* 52* 55*   < > = values in this interval not displayed.   Basic Metabolic Panel: Recent Labs  Lab 10/23/19 0251 10/24/19 0420 10/25/19 0439 10/26/19 0437 10/27/19 0555  NA 133* 137 138 134* 136  K 3.3* 3.5 3.4* 3.5 3.1*  CL 77* 98 106 108 107  CO2 23 30 26 22 24   GLUCOSE 127* 161* 123* 121* 125*  BUN 47* 48* 21* 13 8  CREATININE 1.61* 0.88 0.51 0.55 <0.30*  CALCIUM 8.9 6.6* 6.6* 6.6* 7.0*  MG  --  1.9 1.8  --  1.6*  PHOS  --  4.7*  --   --   --    GFR: CrCl cannot be calculated (This lab value cannot be used to calculate CrCl because it is not a number: <0.30). Liver Function  Tests: Recent Labs  Lab 10/23/19 0251 10/24/19 0420 10/25/19 0439 10/26/19 0437 10/27/19 0555  AST 838* 723* 361* 217* 134*  ALT 210* 182* 150* 125* 94*  ALKPHOS 73 58 57 69 95  BILITOT 4.2* 3.8* 3.1* 2.4* 2.8*  PROT 6.0* 5.4* 5.2* 5.1* 5.1*  ALBUMIN 2.8* 2.6* 2.5* 2.3* 2.2*   Recent Labs  Lab 10/23/19 0251  LIPASE 34   Recent Labs  Lab 10/23/19 0252  AMMONIA 19   Coagulation Profile: Recent Labs  Lab 10/23/19 0251 10/24/19 1819 10/25/19 0439 10/26/19 0437 10/27/19 0555  INR 1.8* 1.8* 1.7* 1.6* 1.7*   Cardiac Enzymes: No results for input(s): CKTOTAL, CKMB, CKMBINDEX, TROPONINI in the last 168 hours. BNP (last 3 results) No results for input(s): PROBNP in the last 8760 hours. HbA1C: No results for input(s): HGBA1C in the last 72 hours. CBG: Recent Labs  Lab 10/25/19 1946  GLUCAP 124*   Lipid Profile: No results for input(s): CHOL, HDL, LDLCALC, TRIG, CHOLHDL, LDLDIRECT in the last 72 hours. Thyroid Function Tests: No results for input(s): TSH, T4TOTAL, FREET4, T3FREE, THYROIDAB in the last 72 hours. Anemia Panel: No results for input(s): VITAMINB12, FOLATE,  FERRITIN, TIBC, IRON, RETICCTPCT in the last 72 hours. Sepsis Labs: Recent Labs  Lab 10/23/19 0252 10/23/19 0431 10/23/19 1635 10/23/19 1756  LATICACIDVEN >11.0* >11.0* 1.4 1.3    Recent Results (from the past 240 hour(s))  MRSA PCR Screening     Status: None   Collection Time: 10/23/19  2:44 AM   Specimen: Nasal Mucosa; Nasopharyngeal  Result Value Ref Range Status   MRSA by PCR NEGATIVE NEGATIVE Final    Comment:        The GeneXpert MRSA Assay (FDA approved for NASAL specimens only), is one component of a comprehensive MRSA colonization surveillance program. It is not intended to diagnose MRSA infection nor to guide or monitor treatment for MRSA infections. Performed at [facility], [address] [place], [place] [address]   SARS Coronavirus 2 by RT PCR (hospital order, performed in [facility] hospital lab) Nasopharyngeal Nasopharyngeal Swab     Status: None   Collection Time: 10/23/19  2:53 AM   Specimen: Nasopharyngeal Swab  Result Value Ref Range Status   SARS Coronavirus 2 NEGATIVE NEGATIVE Final    Comment: (NOTE) SARS-CoV-2 target nucleic acids are NOT DETECTED. The SARS-CoV-2 RNA is generally detectable in upper and lower respiratory specimens during the acute phase of infection. The lowest concentration of SARS-CoV-2 viral copies this assay can detect is 250 copies / mL. A negative result does not preclude SARS-CoV-2 infection and should not be used as the sole basis for treatment or other patient management decisions.  A negative result may occur with improper specimen collection / handling, submission of specimen other than nasopharyngeal swab, presence of viral mutation(s) within the areas targeted by this assay, and inadequate number of viral copies (<250 copies / mL). A negative result must be combined with clinical observations, patient history, and epidemiological information. Fact Sheet for Patients:    StrictlyIdeas.no Fact Sheet for Healthcare Providers: BankingDealers.co.za This test is not yet approved or cleared  by the [place] FDA and has been authorized for detection and/or diagnosis of SARS-CoV-2 by FDA under an Emergency Use Authorization (EUA).  This EUA will remain in effect (meaning this test can be used) for the duration of the COVID-19 declaration under Section 564(b)(1) of the Act, 21 U.S.C. section 360bbb-3(b)(1), unless the authorization is terminated or revoked sooner. Performed at  [facility], [address], [place], [place] [address]      Radiology Studies: No results found.  Scheduled Meds: . sodium chloride   Intravenous Once  . Chlorhexidine Gluconate Cloth  6 each Topical Daily  . docusate sodium  100 mg Oral BID  . folic acid  1 mg Oral Daily  . lactulose  20 g Oral BID  . multivitamin with minerals  1 tablet Oral Daily  . nicotine  21 mg Transdermal Daily  . pantoprazole  40 mg Oral BID  . pneumococcal 23 valent vaccine  0.5 mL Intramuscular Tomorrow-1000  . thiamine  100 mg Intravenous Daily   Continuous Infusions: . cefTRIAXone (ROCEPHIN)  IV 2 g (10/27/19 1353)     LOS: 4 days   Time spent: 40 minutes.Lorella Nimrod, MD [facility]  If 7PM-7AM, please contact night-coverage [email or web address]  10/27/2019, 2:06 PM   This record has been created using Systems analyst. Errors have been sought and corrected,but may not always be located. Such creation errors do not reflect on the standard of care.

## 2019-10-27 NOTE — TOC Progression Note (Addendum)
Transition of Care [facility]) - Progression Note    Patient Details  Name: Rhonda Martin MRN: [id number] Date of Birth: Jan 16, 1976  Transition of Care [facility]) CM/SW Rogers, LCSW Phone Number: 10/27/2019, 11:51 AM  Clinical Narrative:   CSW informed by Dr. Reesa Chew that patient will be medically ready for discharge in a day or so. CSW called [facility] DSS to follow-up about APS report. Was informed that APS Worker is Raymond Gurney [phone]).  CSW called Roni to follow-up. Left a voicemail requesting a return call.   CSW also asked Dr. Reesa Chew again about ordering a psych consult if she feels it is appropriate.      Barriers to Discharge: Continued Medical Work up  Expected Discharge Plan and Services         Living arrangements for the past 2 months: Single Family Home                                       Social Determinants of Health (SDOH) Interventions    Readmission Risk Interventions Readmission Risk Prevention Plan 10/25/2019 01/21/2019  Post Dischage Appt - Complete  Medication Screening - Complete  Transportation Screening Complete Complete  PCP or Specialist Appt within 3-5 Days Complete -  HRI or Home Care Consult Complete -  Medication Review (RN Care Manager) Complete -

## 2019-10-27 NOTE — Progress Notes (Signed)
Report called to [place] RN.  Pt awake alert and oriented.  C/o anxiety and asking for ativan that has been dc.  C/o nausea aftermeeting with APS.  zofran iv given.  Pt moved to room 113 via w/c.   Very weak. 1-2 + assist.

## 2019-10-28 ENCOUNTER — Inpatient Hospital Stay: Payer: Medicaid Other

## 2019-10-28 LAB — PROCALCITONIN: Procalcitonin: 2.78 ng/mL

## 2019-10-28 LAB — LACTATE DEHYDROGENASE: LDH: 204 U/L — ABNORMAL HIGH (ref 98–192)

## 2019-10-28 LAB — CBC WITH DIFFERENTIAL/PLATELET
Abs Immature Granulocytes: 0.01 10*3/uL (ref 0.00–0.07)
Basophils Absolute: 0 10*3/uL (ref 0.0–0.1)
Basophils Relative: 1 %
Eosinophils Absolute: 0 10*3/uL (ref 0.0–0.5)
Eosinophils Relative: 1 %
HCT: 29 % — ABNORMAL LOW (ref 36.0–46.0)
Hemoglobin: 9.4 g/dL — ABNORMAL LOW (ref 12.0–15.0)
Immature Granulocytes: 1 %
Lymphocytes Relative: 5 %
Lymphs Abs: 0.1 10*3/uL — ABNORMAL LOW (ref 0.7–4.0)
MCH: 29.4 pg (ref 26.0–34.0)
MCHC: 32.4 g/dL (ref 30.0–36.0)
MCV: 90.6 fL (ref 80.0–100.0)
Monocytes Absolute: 0 10*3/uL — ABNORMAL LOW (ref 0.1–1.0)
Monocytes Relative: 2 %
Neutro Abs: 0.9 10*3/uL — ABNORMAL LOW (ref 1.7–7.7)
Neutrophils Relative %: 90 %
Platelets: 52 10*3/uL — ABNORMAL LOW (ref 150–400)
RBC: 3.2 MIL/uL — ABNORMAL LOW (ref 3.87–5.11)
RDW: 17.2 % — ABNORMAL HIGH (ref 11.5–15.5)
Smear Review: NORMAL
WBC: 1 10*3/uL — CL (ref 4.0–10.5)
nRBC: 0 % (ref 0.0–0.2)

## 2019-10-28 LAB — CBC
HCT: 27.8 % — ABNORMAL LOW (ref 36.0–46.0)
Hemoglobin: 9.4 g/dL — ABNORMAL LOW (ref 12.0–15.0)
MCH: 29.6 pg (ref 26.0–34.0)
MCHC: 33.8 g/dL (ref 30.0–36.0)
MCV: 87.4 fL (ref 80.0–100.0)
Platelets: 51 10*3/uL — ABNORMAL LOW (ref 150–400)
RBC: 3.18 MIL/uL — ABNORMAL LOW (ref 3.87–5.11)
RDW: 17.5 % — ABNORMAL HIGH (ref 11.5–15.5)
WBC: 0.7 10*3/uL — CL (ref 4.0–10.5)
nRBC: 0 % (ref 0.0–0.2)

## 2019-10-28 LAB — URINALYSIS, COMPLETE (UACMP) WITH MICROSCOPIC
Bacteria, UA: NONE SEEN
Bilirubin Urine: NEGATIVE
Glucose, UA: NEGATIVE mg/dL
Hgb urine dipstick: NEGATIVE
Ketones, ur: NEGATIVE mg/dL
Nitrite: NEGATIVE
Protein, ur: NEGATIVE mg/dL
Specific Gravity, Urine: 1.009 (ref 1.005–1.030)
pH: 6 (ref 5.0–8.0)

## 2019-10-28 LAB — BASIC METABOLIC PANEL
Anion gap: 8 (ref 5–15)
BUN: 6 mg/dL (ref 6–20)
CO2: 21 mmol/L — ABNORMAL LOW (ref 22–32)
Calcium: 7.5 mg/dL — ABNORMAL LOW (ref 8.9–10.3)
Chloride: 105 mmol/L (ref 98–111)
Creatinine, Ser: 0.41 mg/dL — ABNORMAL LOW (ref 0.44–1.00)
GFR calc Af Amer: >60 mL/min (ref >60)
GFR calc non Af Amer: >60 mL/min (ref >60)
Glucose, Bld: 116 mg/dL — ABNORMAL HIGH (ref 70–99)
Potassium: 3.3 mmol/L — ABNORMAL LOW (ref 3.5–5.1)
Sodium: 134 mmol/L — ABNORMAL LOW (ref 135–145)

## 2019-10-28 LAB — PROTIME-INR
INR: 1.6 — ABNORMAL HIGH (ref 0.8–1.2)
Prothrombin Time: 18.3 seconds — ABNORMAL HIGH (ref 11.4–15.2)

## 2019-10-28 LAB — FIBRINOGEN: Fibrinogen: 213 mg/dL (ref 210–475)

## 2019-10-28 LAB — FIBRIN DERIVATIVES D-DIMER (ARMC ONLY): Fibrin derivatives D-dimer (ARMC): 5925.03 ng/mL (FEU) — ABNORMAL HIGH (ref 0.00–499.00)

## 2019-10-28 MED ORDER — PIPERACILLIN-TAZOBACTAM 3.375 G IVPB
3.3750 g | Freq: Three times a day (TID) | INTRAVENOUS | Status: AC
  Administered 2019-10-28 – 2019-11-01 (×14): 3.375 g via INTRAVENOUS
  Filled 2019-10-28 (×16): qty 50

## 2019-10-28 MED ORDER — IBUPROFEN 400 MG PO TABS
400.0000 mg | ORAL_TABLET | Freq: Once | ORAL | Status: AC
  Administered 2019-10-28: 08:00:00 400 mg via ORAL
  Filled 2019-10-28: qty 1

## 2019-10-28 MED ORDER — LACTATED RINGERS IV BOLUS
500.0000 mL | INTRAVENOUS | Status: AC
  Administered 2019-10-28: 500 mL via INTRAVENOUS

## 2019-10-28 MED ORDER — LACTATED RINGERS IV BOLUS
1000.0000 mL | INTRAVENOUS | Status: AC
  Administered 2019-10-28: 18:00:00 1000 mL via INTRAVENOUS

## 2019-10-28 MED ORDER — IOHEXOL 9 MG/ML PO SOLN
500.0000 mL | ORAL | Status: AC
  Administered 2019-10-28: 500 mL via ORAL

## 2019-10-28 MED ORDER — LACTATED RINGERS IV SOLN: INTRAVENOUS | Status: DC

## 2019-10-28 MED ORDER — SODIUM CHLORIDE 0.9 % IV SOLN
INTRAVENOUS | Status: DC | PRN
  Administered 2019-10-28 – 2019-10-30 (×4): 250 mL via INTRAVENOUS

## 2019-10-28 MED ORDER — OXYCODONE HCL 5 MG PO TABS
5.0000 mg | ORAL_TABLET | ORAL | Status: DC | PRN
  Administered 2019-10-28: 5 mg via ORAL
  Filled 2019-10-28: qty 1

## 2019-10-28 MED ORDER — LORAZEPAM 1 MG PO TABS
1.0000 mg | ORAL_TABLET | Freq: Two times a day (BID) | ORAL | Status: DC | PRN
  Administered 2019-10-28 – 2019-10-31 (×5): 1 mg via ORAL
  Filled 2019-10-28 (×6): qty 1

## 2019-10-28 MED ORDER — PIPERACILLIN-TAZOBACTAM 3.375 G IVPB 30 MIN: 3.3750 g | Freq: Once | INTRAVENOUS | Status: DC

## 2019-10-28 MED ORDER — MAGNESIUM SULFATE 2 GM/50ML IV SOLN
2.0000 g | Freq: Once | INTRAVENOUS | Status: AC
  Administered 2019-10-28: 10:00:00 2 g via INTRAVENOUS
  Filled 2019-10-28: qty 50

## 2019-10-28 NOTE — Progress Notes (Signed)
Pharmacy Antibiotic Note  Rhonda Martin is a 44 y.o. female admitted on 10/23/2019. Concern for febrile neutropenia. Pharmacy has been consulted for Zosyn dosing.  Plan: Zosyn 3.375 g IV q8h extended infusion  Height: 5\' 2"  (157.5 cm) Weight: 56.2 kg (124 lb) IBW/kg (Calculated) : 50.1  Temp (24hrs), Avg:100.1 F (37.8 C), Min:98.1 F (36.7 C), Max:102.3 F (39.1 C)  Recent Labs  Lab  0000 10/23/19 0252 10/23/19 0431 10/23/19 1416 10/23/19 1635 10/23/19 1756 10/24/19 0420 10/24/19 1637 10/25/19 0439 10/26/19 0437 10/27/19 0555 10/28/19 0628 10/28/19 0802 10/28/19 0803  WBC  --   --   --    < >  --   --  3.7*   < > 3.9* 3.8* 5.0 0.7*  --  1.0*  CREATININE   < >  --   --   --   --   --  0.88  --  0.51 0.55 <0.30*  --  0.41*  --   LATICACIDVEN  --  >11.0* >11.0*  --  1.4 1.3  --   --   --   --   --   --   --   --    < > = values in this interval not displayed.    Estimated Creatinine Clearance: 71.7 mL/min (A) (by C-G formula based on SCr of 0.41 mg/dL (L)).    No Known Allergies  Antimicrobials this admission: Zosyn 5/22 >>   Microbiology results: 5/22 BCx: pending 5/17 MRSA PCR: negative  Thank you for allowing pharmacy to be a part of this patient's care.  Tawnya Crook, PharmD 10/28/2019 12:04 PM

## 2019-10-28 NOTE — Progress Notes (Signed)
PROGRESS NOTE    Rhonda Martin  ZJQ:[id number] DOB: 04/28/1976 DOA: 10/23/2019 PCP: Volney American, PA-C   Brief Narrative: [name] is a 44 yo female with medical history significant for alcoholic cirrhosis, alcohol abuse,hepatitis C,, hypertension, COPD, GERD, anxiety, iron deficiency anemia, cervical cancer, tobacco abuse, thrombocytopeniaWho presented with several episodes of emesis of bright red blood over the past 2 days associated with epigastric pain. On EMS evaluation patient was found to have a systolic blood pressure initially in the 80s and reportedly came down to the 40s.  Initially treated with IV fluid, IV octreotide and IV Protonix along with 2 units of PRBC. 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   No variceal GI bleed.  No recurrent hematemesis.  Subjective: Patient continued to complain about abdominal pain.  She become febrile at 102 this morning.  CBC with ESR done drop in her leukocytes.  Nursing concern she was intermittently confused.  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  Can become febrile with a acute drop in her white cell count 0.7 from 5 yesterday.  Repeat CBC with white cell count of 1 and neutrophils of 0.9. Might be acute bone marrow suppression secondary to alcoholic cirrhosis. -LDH mildly elevated at 204. -Fibrinogen, D-dimer and haptoglobin pending. -CT abdomen with signs of colitis with some mesenteric edema. -Chest x-ray with left basilar  atelectasis/infiltrate. -Get blood cultures. -UA-we will add culture if needed. -Procalcitonin. -Switch ceftriaxone with Zosyn per neutropenic febrile illness protocol. -Daily CBCs with differential.  Hematemesis in patient with liver cirrhosis secondary to esophagitis and Dieulafoy lesion. No esophageal varices on EGD.  No recurrent bleeding. Hemoglobin at 9.4 today. GI is following-appreciate their recommendations. -Continue twice daily Protonix. -Continue monitoring CBC.  Acute on chronic anemia in setting of acute blood loss related to above. -Patient received 2 units of PRBCs.  Hemoglobin stable at 9.6 this morning. -Continue to monitor-transfuse if below 8.  Hypotension.  Most likely secondary to acute blood loss.  Resolved. -Continue to monitor.  History of alcohol abuse with concern for withdrawal.  Patient was started on Precedex infusion due to worsening agitation and confusion.  Per patient she does not drink regularly but her last drink was couple of days ago. Precedex was weaned off, patient seems little agitated continue to ask for more pain meds and Ativan. -Continue to monitor with CIWA protocol. -We will try to avoid Precedex if possible, Ativan protocol is in place, Precedex can be restarted if needed.  Alcoholic liver cirrhosis.  Child pugh class C, with a score of 11.  Elevated LFTs with hyperbilirubinemia most likely multifactorial with a combination of shock liver along with alcoholic hepatitis and hepatitis C.  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  Hepatitis B and C viral DNA is negative. Right upper quadrant ultrasound with cholelithiasis. -Continue supportive care.  Acute on chronic thrombocytopenia.  Platelet stable,  52 today. Baseline around 110.  No active bleeding. -Continue to monitor.  Concern of threatening environment at home.  See social  worker's note for details. -Social workers are consulting adult and child protective services. - psych consult for underlying psych issues-pending their evaluation.  Bilateral knee pain. Being managed by orthopedic as an outpatient. -Continue pain management.  There is some concern of drug-seeking behavior as she continued to ask for more pain meds and appears lethargic. -We will try Toradol before opioids.  Objective: Vitals:   10/28/19 0827 10/28/19 0854 10/28/19 1009 10/28/19 1100  BP: (!) 157/60 (!) 136/55 (!) 118/54 (!) 117/53  Pulse:  (!) 122    Resp: _0 Temp: (!) 102.3 F (39.1 C) 99.2 F (37.3 C) 99.7 F (37.6 C) 99.4 F (37.4 C)  TempSrc: Oral Oral Oral Oral  SpO2: 98% 98% 97% 97%  Weight:      Height:        Intake/Output Summary (Last 24 hours) at 10/28/2019 1217 Last data filed at 10/28/2019 0455 Gross per 24 hour  Intake 0 ml  Output 800 ml  Net -800 ml   Filed Weights   10/23/19 0248 10/23/19 1459  Weight: 54.4 kg 56.2 kg    Examination:  General exam: Frail lady, appears lethargic. Respiratory system: Clear to auscultation. Respiratory effort normal. Cardiovascular system: S1 & S2 heard, RRR. No JVD, murmurs, rubs, Gastrointestinal system: Soft, mild diffuse tenderness, mildly distended, bowel sounds positive. Central nervous system: Alert and oriented. No focal neurological deficits. Extremities: No edema, no cyanosis, pulses intact and symmetrical. Psychiatry: Judgement and insight appear normal.    DVT prophylaxis: SCDs. Code Status: Full Family Communication: Discussed with patient Disposition Plan:  Status is: Inpatient  Remains inpatient appropriate because:Inpatient level of care appropriate due to severity of illness   Dispo: The patient is from: Home              Anticipated d/c is to: To be determined              Anticipated d/c date is: 1-2 days.              Patient currently is not medically stable to d/c.  Patient  developed acute onset neutropenic fever, undergoing investigation at this time.  Patient does not feel safe at home.  Social worker are trying to contact APS.  Also involved psychiatry to determine underlying psych issues.   Consultants:   GI  Psychiatry  Procedures:  EGD-10/23/2019  Antimicrobials:  Zosyn  Data Reviewed: I have personally reviewed following labs and imaging studies  CBC: Recent Labs  Lab 10/23/19 0251 10/23/19 1416 10/25/19 0439 10/26/19 0437 10/27/19 0555 10/28/19 0628 10/28/19 0803  WBC 16.3*   < > 3.9* 3.8* 5.0 0.7* 1.0*  NEUTROABS 13.1*  --   --   --   --   --  0.9*  HGB 6.1*   < > 9.3* 9.6* 8.8* 9.4* 9.4*  HCT 17.9*   < > 25.9* 28.5* 26.3* 27.8* 29.0*  MCV 86.1   < > 85.8 88.2 89.5 87.4 90.6  PLT 117*   < > 43* 52* 55* 51* 52*   < > = values in this interval not displayed.   Basic Metabolic Panel: Recent Labs  Lab 10/24/19 0420 10/25/19 0439 10/26/19 0437 10/27/19 0555 10/28/19 0802  NA 137 138 134* 136 134*  K 3.5 3.4* 3.5 3.1* 3.3*  CL 98 106 108 107 105  CO2 30  _0 21*  GLUCOSE 161* 123* 121* 125* 116*  BUN 48* 21* _1 CREATININE 0.88 0.51 0.55 <0.30* 0.41*  CALCIUM 6.6* 6.6* 6.6* 7.0* 7.5*  MG 1.9 1.8  --  1.6*  --   PHOS 4.7*  --   --   --   --    GFR: Estimated Creatinine Clearance: 71.7 mL/min (A) (by C-G formula based on SCr of 0.41 mg/dL (L)). Liver Function Tests: Recent Labs  Lab 10/23/19 0251 10/24/19 0420 10/25/19 0439 10/26/19 0437 10/27/19 0555  AST 838* 723* 361* 217* 134*  ALT 210* 182* 150* 125* 94*  ALKPHOS 73 58 57 69 95  BILITOT 4.2* 3.8* 3.1* 2.4* 2.8*  PROT 6.0* 5.4* 5.2* 5.1* 5.1*  ALBUMIN 2.8* 2.6* 2.5* 2.3* 2.2*   Recent Labs  Lab 10/23/19 0251  LIPASE 34   Recent Labs  Lab 10/23/19 0252  AMMONIA 19   Coagulation Profile: Recent Labs  Lab 10/24/19 1819 10/25/19 0439 10/26/19 0437 10/27/19 0555 10/28/19 0628  INR 1.8* 1.7* 1.6* 1.7* 1.6*   Cardiac Enzymes: No results for  input(s): CKTOTAL, CKMB, CKMBINDEX, TROPONINI in the last 168 hours. BNP (last 3 results) No results for input(s): PROBNP in the last 8760 hours. HbA1C: No results for input(s): HGBA1C in the last 72 hours. CBG: Recent Labs  Lab 10/25/19 1946  GLUCAP 124*   Lipid Profile: No results for input(s): CHOL, HDL, LDLCALC, TRIG, CHOLHDL, LDLDIRECT in the last 72 hours. Thyroid Function Tests: No results for input(s): TSH, T4TOTAL, FREET4, T3FREE, THYROIDAB in the last 72 hours. Anemia Panel: No results for input(s): VITAMINB12, FOLATE, FERRITIN, TIBC, IRON, RETICCTPCT in the last 72 hours. Sepsis Labs: Recent Labs  Lab 10/23/19 0252 10/23/19 0431 10/23/19 1635 10/23/19 1756  LATICACIDVEN >11.0* >11.0* 1.4 1.3    Recent Results (from the past 240 hour(s))  MRSA PCR Screening     Status: None   Collection Time: 10/23/19  2:44 AM   Specimen: Nasal Mucosa; Nasopharyngeal  Result Value Ref Range Status   MRSA by PCR NEGATIVE NEGATIVE Final    Comment:        The GeneXpert MRSA Assay (FDA approved for NASAL specimens only), is one component of a comprehensive MRSA colonization surveillance program. It is not intended to diagnose MRSA infection nor to guide or monitor treatment for MRSA infections. Performed at [facility], [address] [place], [place] [address]   SARS Coronavirus 2 by RT PCR (hospital order, performed in [facility] hospital lab) Nasopharyngeal Nasopharyngeal Swab     Status: None   Collection Time: 10/23/19  2:53 AM   Specimen: Nasopharyngeal Swab  Result Value Ref Range Status   SARS Coronavirus 2 NEGATIVE NEGATIVE Final    Comment: (NOTE) SARS-CoV-2 target nucleic acids are NOT DETECTED. The SARS-CoV-2 RNA is generally detectable in upper and lower respiratory specimens during the acute phase of infection. The lowest concentration of SARS-CoV-2 viral copies this assay can detect is 250 copies / mL. A negative result does not preclude  SARS-CoV-2 infection and should not be used as the sole basis for treatment or other patient management decisions.  A negative result may occur with improper specimen collection / handling, submission of specimen other than nasopharyngeal swab, presence of viral mutation(s) within the areas targeted by this assay, and inadequate number of viral copies (<250 copies / mL). A negative result must be combined with clinical observations, patient history, and epidemiological information. Fact Sheet for Patients:  StrictlyIdeas.no Fact Sheet for Healthcare Providers: BankingDealers.co.za This test is not yet approved or cleared  by the [place] FDA and has been authorized for detection and/or diagnosis of SARS-CoV-2 by FDA under an Emergency Use Authorization (EUA).  This EUA will remain in effect (meaning this test can be used) for the duration of the COVID-19 declaration under Section 564(b)(1) of the Act, 21 U.S.C. section 360bbb-3(b)(1), unless the authorization is terminated or revoked sooner. Performed at [facility], [address] [place], [place] [address]      Radiology Studies: CT ABDOMEN PELVIS WO CONTRAST  Result Date: 10/28/2019 CLINICAL DATA:  44 year old female with a history of fever and abdominal pain EXAM: CT ABDOMEN AND PELVIS WITHOUT CONTRAST TECHNIQUE: Multidetector CT imaging of the abdomen and pelvis was performed following the standard protocol without IV contrast. COMPARISON:  10/25/2019, 07/10/2019 FINDINGS: Lower chest: Since the prior CT there has been resolution of the peripheral ground-glass opacities in the bases of the lungs. Scarring/atelectasis at the left lung base. Trace pleural fluid. Hepatobiliary: Nodular contour of liver parenchyma with enlargement of the caudate lobe. Hyperdense material within the dependent gallbladder. Gallbladder wall without thickening or local inflammatory changes.  Pancreas: Unremarkable, though incompletely characterized given presence of local inflammation in the absence of IV contrast. Spleen: Greatest diameter of the spleen measures at least 14.8 cm. Adrenals/Urinary Tract: - Right adrenal gland:  Unremarkable - Left adrenal gland: Unremarkable. - Right kidney: No hydronephrosis, nephrolithiasis, inflammation, or ureteral dilation. No focal lesion. - Left Kidney: No hydronephrosis, nephrolithiasis, inflammation, or ureteral dilation. No focal lesion. - Urinary Bladder: Partially distended. Stomach/Bowel: - Stomach: Unremarkable. - Small bowel: Enteric contrast has traversed only the proximal small bowel. Distal small bowel remains unopacified though relatively decompressed. - Appendix: Normal. - Colon: Circumferential wall thickening of the right colon and cecum. Mild stool burden. Vascular/Lymphatic: Mild atherosclerosis of the abdominal aorta. Although vascular structures unopacified, there is redemonstration of umbilical vein recanalization secondary to portal hypertension as well as portal-portal collaterals within the upper abdomen. Reproductive: IUD within the uterus.  Otherwise unremarkable adnexa. Other: Small-moderate volume ascites.  Mesenteric edema. Small lymph nodes in the bilateral inguinal region, unchanged from prior. Musculoskeletal: Redemonstration of compression fractures of T10, L1 without retropulsion of posterior endplate. Schmorl's node of L3 again demonstrated. Focal kyphotic deformity centered at the L1 level is again noted. Degenerative changes of the hips. No bony canal narrowing. IMPRESSION: The right colon is circumferentially thickened indicating colitis. Etiology uncertain, though portal colopathy is favored given the stigmata of cirrhosis and portal hypertension. Alternative differential diagnosis includes infectious and inflammatory. Redemonstration of cirrhosis, portal hypertension with portal collaterals of the upper abdomen,  splenomegaly, and small-moderate volume ascites with mesenteric edema. Cholelithiasis without evidence of acute cholecystitis. Additional ancillary findings as above. Electronically Signed   By: Corrie Mckusick D.O.   On: 10/28/2019 10:45   DG Chest 2 View  Result Date: 10/28/2019 CLINICAL DATA:  Cough EXAM: CHEST - 2 VIEW COMPARISON:  10/23/2019 FINDINGS: Low volume chest with band of indistinct opacity at the left base. Normal heart size and mediastinal contours. No Kerley lines. No pneumothorax IMPRESSION: Atelectasis or bronchopneumonia at the left base. Electronically Signed   By: Monte Fantasia M.D.   On: 10/28/2019 11:30    Scheduled Meds: . sodium chloride   Intravenous Once  . Chlorhexidine Gluconate Cloth  6 each Topical Daily  . docusate sodium  100 mg Oral BID  . folic acid  1 mg Oral Daily  .  lactulose  20 g Oral BID  . multivitamin with minerals  1 tablet Oral Daily  . nicotine  21 mg Transdermal Daily  . pantoprazole  40 mg Oral BID  . pneumococcal 23 valent vaccine  0.5 mL Intramuscular Tomorrow-1000  . thiamine  100 mg Intravenous Daily   Continuous Infusions: . lactated ringers 100 mL/hr at 10/28/19 1202  . piperacillin-tazobactam (ZOSYN)  IV 3.375 g (10/28/19 1019)     LOS: 5 days   Time spent: 50 minutes.Lorella Nimrod, MD [facility]  If 7PM-7AM, please contact night-coverage [email or web address]  10/28/2019, 12:17 PM   This record has been created using Systems analyst. Errors have been sought and corrected,but may not always be located. Such creation errors do not reflect on the standard of care.

## 2019-10-28 NOTE — Consult Note (Signed)
NP attempted to follow-up with patient regarding psychiatric consult however patient reports " I am in too much pain right now."  Patient continues to doze off during assessment.  Appears to be too sedated to participate with this assessment."  I just took something to help with his pain."  NP will attempt to follow-up with patient on 10/29/2019.  Patient's RN made aware aware of assessment attempt. Support, encouragement and reassurance was provided

## 2019-10-28 NOTE — Progress Notes (Signed)
   10/28/19 1600  Vitals  BP (!) 97/42  MAP (mmHg) (!) 57  ECG Heart Rate 99  Resp 15  MEWS Score  MEWS Temp 0  MEWS Systolic 1  MEWS Pulse 0  MEWS RR 0  MEWS LOC 1  MEWS Score 2  MEWS Score Color Yellow  Notified MD Amin via amion awaiting response

## 2019-10-28 NOTE — Progress Notes (Signed)
Plan of care reviewed with pt; medications discussed prior to administering; pain addressed with prn pain meds; pt did exp episode of Nausea address with zofran; safety precautions maintained; bed low and locked; call bell within reach.

## 2019-10-28 NOTE — Progress Notes (Addendum)
   10/28/19 0653  Vitals  Temp (!) 102.3 F (39.1 C)  Temp Source Oral  BP 135/63  MAP (mmHg) 85  BP Location Right Leg  BP Method Automatic  Patient Position (if appropriate) Lying  Pulse Rate (!) 131  Pulse Rate Source Monitor  Resp 20  Oxygen Therapy  SpO2 98 %  O2 Device Room Air  MEWS Score  MEWS Temp 2  MEWS Systolic 0  MEWS Pulse 3  MEWS RR 0  MEWS LOC 0  MEWS Score 5  MEWS Score Color Red  notified MD Amin via amion-change from yellow to red mews, temp 102.3 HR 130's. we do not have anyting ordered for fever. [place] paged MD for RN. See New orders.  [id number] secure chate MD Reesa Chew- pt appears pale and is asking" to be taking to hospital".  awaiting response  0818 lab called critical WBC 0.7 notified MD Reesa Chew via Shea Evans MD Reesa Chew at bedside see new orders.  1300 notified MD Amin via Amion " BP has been trending down currently 98/45 map 63, this morning it was 135/63. fluids are running at 164ml hour'' 1321 administered 500 ml bolus per verbal order.

## 2019-10-28 NOTE — Progress Notes (Addendum)
   10/28/19 1700  Vitals  BP (!) 92/46 (notified MD Amin)  MAP (mmHg) (!) 59  ECG Heart Rate 97  Resp 14  MEWS Score  MEWS Temp 0  MEWS Systolic 1  MEWS Pulse 0  MEWS RR 0  MEWS LOC 1  MEWS Score 2  MEWS Score Color Yellow  Notified MD awaiting response pt is a&o denise lightlessness  Verbal order to give 1L LR bolus over 1 hour. See mar

## 2019-10-28 NOTE — Progress Notes (Signed)
   10/28/19 1903  Vitals  Temp 98.8 F (37.1 C)  Temp Source Oral  BP (!) 96/37  MAP (mmHg) (!) 56  BP Location Right Arm  BP Method Automatic  Patient Position (if appropriate) Lying  Pulse Rate 100  Pulse Rate Source Monitor  ECG Heart Rate 100  Resp 17  Level of Consciousness  Level of Consciousness Alert  Oxygen Therapy  SpO2 98 %  O2 Device Room Air  MD Amin aware fluid bolus running

## 2019-10-28 NOTE — Progress Notes (Signed)
   10/28/19 0653  Assess: MEWS Score  Temp (!) 102.3 F (39.1 C)  BP 135/63  Pulse Rate (!) 131  Resp 20  SpO2 98 %  O2 Device Room Air  Assess: MEWS Score  MEWS Temp 2  MEWS Systolic 0  MEWS Pulse 3  MEWS RR 0  MEWS LOC 0  MEWS Score 5  MEWS Score Color Red  Assess: if the MEWS score is Yellow or Red  Were vital signs taken at a resting state? Yes  Focused Assessment Documented focused assessment  Early Detection of Sepsis Score *See Row Information* Low  MEWS guidelines implemented *See Row Information* Yes  Treat  MEWS Interventions Escalated (See documentation below)  Take Vital Signs  Increase Vital Sign Frequency  Red: Q 1hr X 4 then Q 4hr X 4, if remains red, continue Q 4hrs  Escalate  MEWS: Escalate Red: discuss with charge nurse/RN and provider, consider discussing with RRT  Notify: Charge Nurse/RN  Name of Charge Nurse/RN Notified Regino Schultze, RN  Date Charge Nurse/RN Notified 10/28/19  Time Charge Nurse/RN Notified 0654  Notify: Provider  Provider Name/Title Reesa Chew   Date Provider Notified 10/28/19  Time Provider Notified 0700  Notification Type  (amion)  Notification Reason Change in status  Response Other (Comment) (waiting for response from MD)  Date of Provider Response 10/28/19  Time of Provider Response [phone]  Document  Patient Outcome Other (Comment) (continue to monitor closely)   Continuing to await response from MD, paged 2nd time at 0727. Oncoming RN Lenna Sciara made aware of above. Red MEWS vital signs sheet placed on patient door. NT made aware of due vitals at 0800 and Red MEWS protocol in place for patient.   Madlyn Frankel, RN

## 2019-10-29 LAB — PROTIME-INR
INR: 1.9 — ABNORMAL HIGH (ref 0.8–1.2)
Prothrombin Time: 20.9 seconds — ABNORMAL HIGH (ref 11.4–15.2)

## 2019-10-29 LAB — CBC WITH DIFFERENTIAL/PLATELET
Abs Immature Granulocytes: 0.03 10*3/uL (ref 0.00–0.07)
Basophils Absolute: 0 10*3/uL (ref 0.0–0.1)
Basophils Relative: 0 %
Eosinophils Absolute: 0.1 10*3/uL (ref 0.0–0.5)
Eosinophils Relative: 2 %
HCT: 24.2 % — ABNORMAL LOW (ref 36.0–46.0)
Hemoglobin: 7.8 g/dL — ABNORMAL LOW (ref 12.0–15.0)
Immature Granulocytes: 1 %
Lymphocytes Relative: 13 %
Lymphs Abs: 0.7 10*3/uL (ref 0.7–4.0)
MCH: 29.3 pg (ref 26.0–34.0)
MCHC: 32.2 g/dL (ref 30.0–36.0)
MCV: 91 fL (ref 80.0–100.0)
Monocytes Absolute: 1.1 10*3/uL — ABNORMAL HIGH (ref 0.1–1.0)
Monocytes Relative: 23 %
Neutro Abs: 3 10*3/uL (ref 1.7–7.7)
Neutrophils Relative %: 61 %
Platelets: 59 10*3/uL — ABNORMAL LOW (ref 150–400)
RBC: 2.66 MIL/uL — ABNORMAL LOW (ref 3.87–5.11)
RDW: 17.2 % — ABNORMAL HIGH (ref 11.5–15.5)
WBC: 5 10*3/uL (ref 4.0–10.5)
nRBC: 0 % (ref 0.0–0.2)

## 2019-10-29 LAB — PROCALCITONIN: Procalcitonin: 3.27 ng/mL

## 2019-10-29 LAB — APTT: aPTT: 36 seconds (ref 24–36)

## 2019-10-29 LAB — BASIC METABOLIC PANEL
Anion gap: 8 (ref 5–15)
BUN: 6 mg/dL (ref 6–20)
CO2: 18 mmol/L — ABNORMAL LOW (ref 22–32)
Calcium: 6.9 mg/dL — ABNORMAL LOW (ref 8.9–10.3)
Chloride: 107 mmol/L (ref 98–111)
Creatinine, Ser: 0.34 mg/dL — ABNORMAL LOW (ref 0.44–1.00)
GFR calc Af Amer: >60 mL/min (ref >60)
GFR calc non Af Amer: >60 mL/min (ref >60)
Glucose, Bld: 100 mg/dL — ABNORMAL HIGH (ref 70–99)
Potassium: 4 mmol/L (ref 3.5–5.1)
Sodium: 133 mmol/L — ABNORMAL LOW (ref 135–145)

## 2019-10-29 LAB — HEPATIC FUNCTION PANEL
ALT: 62 U/L — ABNORMAL HIGH (ref 0–44)
AST: 76 U/L — ABNORMAL HIGH (ref 15–41)
Albumin: 2 g/dL — ABNORMAL LOW (ref 3.5–5.0)
Alkaline Phosphatase: 91 U/L (ref 38–126)
Bilirubin, Direct: 1.1 mg/dL — ABNORMAL HIGH (ref 0.0–0.2)
Indirect Bilirubin: 1.2 mg/dL — ABNORMAL HIGH (ref 0.3–0.9)
Total Bilirubin: 2.3 mg/dL — ABNORMAL HIGH (ref 0.3–1.2)
Total Protein: 4.7 g/dL — ABNORMAL LOW (ref 6.5–8.1)

## 2019-10-29 LAB — HAPTOGLOBIN: Haptoglobin: 23 mg/dL — ABNORMAL LOW (ref 42–296)

## 2019-10-29 MED ORDER — OXYCODONE HCL 5 MG PO TABS
5.0000 mg | ORAL_TABLET | Freq: Four times a day (QID) | ORAL | Status: DC | PRN
  Administered 2019-10-29 – 2019-11-02 (×12): 5 mg via ORAL
  Filled 2019-10-29 (×12): qty 1

## 2019-10-29 MED ORDER — IBUPROFEN 400 MG PO TABS
400.0000 mg | ORAL_TABLET | Freq: Four times a day (QID) | ORAL | Status: DC | PRN
  Administered 2019-10-29 – 2019-10-30 (×2): 400 mg via ORAL
  Filled 2019-10-29 (×2): qty 1

## 2019-10-29 NOTE — Progress Notes (Signed)
PROGRESS NOTE    Rhonda Martin  [id number] DOB: 1975/09/03 DOA: 10/23/2019 PCP: Rhonda American, PA-C   Brief Narrative: Rhonda Martin is a 44 yo female with medical history significant for alcoholic cirrhosis, alcohol abuse,hepatitis C,, hypertension, COPD, GERD, anxiety, iron deficiency anemia, cervical cancer, tobacco abuse, thrombocytopeniaWho presented with several episodes of emesis of bright red blood over the past 2 days associated with epigastric pain. On EMS evaluation patient was found to have a systolic blood pressure initially in the 80s and reportedly came down to the 40s.  Initially treated with IV fluid, IV octreotide and IV Protonix along with 2 units of PRBC. 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   No variceal GI bleed.  No recurrent hematemesis.  Subjective: Patient continued to complain about abdominal pain.  No more fever.  No nausea or vomiting.  Having BMs.  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  White cell count improved to 5 again today. -LDH mildly elevated at 204. -Fibrinogen,-213-within normal limit. -D-dimer-elevated at 5925 -haptoglobin pending. -CT abdomen with signs of colitis with some mesenteric edema. -Chest x-ray with left basilar atelectasis/infiltrate. -Get blood cultures-negative so far -UA-few leukocytes, not much concern for infection. -Procalcitonin-2.78>>3.27 -Continue Zosyn per possible intra-abdominal infection/colitis/SBP. -Daily CBCs  with differential.  Hematemesis in patient with liver cirrhosis secondary to esophagitis and Dieulafoy lesion. No esophageal varices on EGD.  No recurrent bleeding. Hemoglobin dropped to 7.8 today without any obvious bleeding. GI is following-appreciate their recommendations. -Continue twice daily Protonix. -Continue monitoring CBC. -Transfuse if below 7  Acute on chronic anemia in setting of acute blood loss related to above. -Patient received 2 units of PRBCs.  Hemoglobin dropped to 7.8 this morning. -Continue to monitor-transfuse if below 7.  Hypotension.  Most likely secondary to acute blood loss.  Resolved. -Continue to monitor.  History of alcohol abuse with concern for withdrawal.  Patient received Precedex infusion due to worsening agitation and confusion.  Per patient she does not drink regularly but her last drink was couple of days ago. Precedex was weaned off, patient seems little agitated continue to ask for more pain meds and Ativan. -Continue to monitor with CIWA protocol.  Alcoholic liver cirrhosis.  Child pugh class C, with a score of 11.  Elevated LFTs with hyperbilirubinemia most likely multifactorial with a combination of shock liver along with alcoholic hepatitis and hepatitis C.  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  Hepatitis B and C viral DNA is negative. Right upper quadrant ultrasound with cholelithiasis. -Continue supportive care.  Acute on chronic thrombocytopenia.  Platelet stable,  59 today. Baseline around 110.  No active bleeding. -Continue to monitor.  Concern of threatening environment at home.  See social worker's note for details. -Social workers are consulting adult and child protective services. - psych consult for underlying psych issues-pending their evaluation.  Bilateral knee pain. Being managed by  orthopedic as an  outpatient. -Continue pain management.  There is some concern of drug-seeking behavior as she continued to ask for more pain meds and appears lethargic. -We will try Toradol before opioids.  Objective: Vitals:   10/29/19 0736 10/29/19 0744 10/29/19 0813 10/29/19 1057  BP: (!) 150/71 111/63 (!) 107/55 109/61  Pulse: 66  (!) 104   Resp: 13  16   Temp: 98.3 F (36.8 C)  98.5 F (36.9 C)   TempSrc: Axillary  Oral   SpO2:   98%   Weight:      Height:        Intake/Output Summary (Last 24 hours) at 10/29/2019 1125 Last data filed at 10/29/2019 0900 Gross per 24 hour  Intake 2988.15 ml  Output 1450 ml  Net 1538.15 ml   Filed Weights   10/23/19 0248 10/23/19 1459  Weight: 54.4 kg 56.2 kg    Examination:  General exam: Frail lady, appears lethargic and anxious. Respiratory system: Clear to auscultation. Respiratory effort normal. Cardiovascular system: S1 & S2 heard, RRR. No JVD, murmurs, rubs, Gastrointestinal system: Soft, mild diffuse tenderness, mildly distended, bowel sounds positive. Central nervous system: Alert and oriented. No focal neurological deficits. Extremities: No edema, no cyanosis, pulses intact and symmetrical. Psychiatry: Judgement and insight appear normal.    DVT prophylaxis: SCDs. Code Status: Full Family Communication: Discussed with patient Disposition Plan:  Status is: Inpatient  Remains inpatient appropriate because:Inpatient level of care appropriate due to severity of illness   Dispo: The patient is from: Home              Anticipated d/c is to: To be determined              Anticipated d/c date is: 1-2 days.              Patient currently is not medically stable to d/c.  Patient developed acute onset neutropenic fever, undergoing investigation at this time.  Patient does not feel safe at home.  Social worker are trying to contact APS.  Also involved psychiatry to determine underlying psych issues.   Consultants:    GI  Psychiatry  Procedures:  EGD-10/23/2019  Antimicrobials:  Zosyn  Data Reviewed: I have personally reviewed following labs and imaging studies  CBC: Recent Labs  Lab 10/23/19 0251 10/23/19 1416 10/26/19 0437 10/27/19 0555 10/28/19 0628 10/28/19 0803 10/29/19 0606  WBC 16.3*   < > 3.8* 5.0 0.7* 1.0* 5.0  NEUTROABS 13.1*  --   --   --   --  0.9* 3.0  HGB 6.1*   < > 9.6* 8.8* 9.4* 9.4* 7.8*  HCT 17.9*   < > 28.5* 26.3* 27.8* 29.0* 24.2*  MCV 86.1   < > 88.2 89.5 87.4 90.6 91.0  PLT 117*   < > 52* 55* 51* 52* 59*   < > = values in this interval not displayed.   Basic Metabolic Panel: Recent Labs  Lab 10/24/19 0420 10/24/19 0420 10/25/19 0439 10/26/19 0437 10/27/19 0555 10/28/19 0802 10/29/19 0606  NA 137   < > 138 134* 136 134* 133*  K 3.5   < > 3.4* 3.5 3.1* 3.3* 4.0  CL 98   < > 106 108 107 105 107  CO2 30   < > 26 22 24  21* 18*  GLUCOSE 161*   < > 123* 121* 125* 116* 100*  BUN 48*   < > 21* 13 8 6 6   CREATININE 0.88   < > 0.51  0.55 <0.30* 0.41* 0.34*  CALCIUM 6.6*   < > 6.6* 6.6* 7.0* 7.5* 6.9*  MG 1.9  --  1.8  --  1.6*  --   --   PHOS 4.7*  --   --   --   --   --   --    < > = values in this interval not displayed.   GFR: Estimated Creatinine Clearance: 71.7 mL/min (A) (by C-G formula based on SCr of 0.34 mg/dL (L)). Liver Function Tests: Recent Labs  Lab 10/24/19 0420 10/25/19 0439 10/26/19 0437 10/27/19 0555 10/29/19 0606  AST 723* 361* 217* 134* 76*  ALT 182* 150* 125* 94* 62*  ALKPHOS 58 57 69 95 91  BILITOT 3.8* 3.1* 2.4* 2.8* 2.3*  PROT 5.4* 5.2* 5.1* 5.1* 4.7*  ALBUMIN 2.6* 2.5* 2.3* 2.2* 2.0*   Recent Labs  Lab 10/23/19 0251  LIPASE 34   Recent Labs  Lab 10/23/19 0252  AMMONIA 19   Coagulation Profile: Recent Labs  Lab 10/25/19 0439 10/26/19 0437 10/27/19 0555 10/28/19 0628 10/29/19 0606  INR 1.7* 1.6* 1.7* 1.6* 1.9*   Cardiac Enzymes: No results for input(s): CKTOTAL, CKMB, CKMBINDEX, TROPONINI in the last 168  hours. BNP (last 3 results) No results for input(s): PROBNP in the last 8760 hours. HbA1C: No results for input(s): HGBA1C in the last 72 hours. CBG: Recent Labs  Lab 10/25/19 1946  GLUCAP 124*   Lipid Profile: No results for input(s): CHOL, HDL, LDLCALC, TRIG, CHOLHDL, LDLDIRECT in the last 72 hours. Thyroid Function Tests: No results for input(s): TSH, T4TOTAL, FREET4, T3FREE, THYROIDAB in the last 72 hours. Anemia Panel: No results for input(s): VITAMINB12, FOLATE, FERRITIN, TIBC, IRON, RETICCTPCT in the last 72 hours. Sepsis Labs: Recent Labs  Lab 10/23/19 0252 10/23/19 0431 10/23/19 1635 10/23/19 1756 10/28/19 1131 10/29/19 0606  PROCALCITON  --   --   --   --  2.78 3.27  LATICACIDVEN >11.0* >11.0* 1.4 1.3  --   --     Recent Results (from the past 240 hour(s))  MRSA PCR Screening     Status: None   Collection Time: 10/23/19  2:44 AM   Specimen: Nasal Mucosa; Nasopharyngeal  Result Value Ref Range Status   MRSA by PCR NEGATIVE NEGATIVE Final    Comment:        The GeneXpert MRSA Assay (FDA approved for NASAL specimens only), is one component of a comprehensive MRSA colonization surveillance program. It is not intended to diagnose MRSA infection nor to guide or monitor treatment for MRSA infections. Performed at [facility], [address] [place], [place] [address]   SARS Coronavirus 2 by RT PCR (hospital order, performed in [facility] hospital lab) Nasopharyngeal Nasopharyngeal Swab     Status: None   Collection Time: 10/23/19  2:53 AM   Specimen: Nasopharyngeal Swab  Result Value Ref Range Status   SARS Coronavirus 2 NEGATIVE NEGATIVE Final    Comment: (NOTE) SARS-CoV-2 target nucleic acids are NOT DETECTED. The SARS-CoV-2 RNA is generally detectable in upper and lower respiratory specimens during the acute phase of infection. The lowest concentration of SARS-CoV-2 viral copies this assay can detect is 250 copies / mL. A negative result  does not preclude SARS-CoV-2 infection and should not be used as the sole basis for treatment or other patient management decisions.  A negative result may occur with improper specimen collection / handling, submission of specimen other than nasopharyngeal swab, presence of viral mutation(s) within the areas targeted  by this assay, and inadequate number of viral copies (<250 copies / mL). A negative result must be combined with clinical observations, patient history, and epidemiological information. Fact Sheet for Patients:   StrictlyIdeas.no Fact Sheet for Healthcare Providers: BankingDealers.co.za This test is not yet approved or cleared  by the [place] FDA and has been authorized for detection and/or diagnosis of SARS-CoV-2 by FDA under an Emergency Use Authorization (EUA).  This EUA will remain in effect (meaning this test can be used) for the duration of the COVID-19 declaration under Section 564(b)(1) of the Act, 21 U.S.C. section 360bbb-3(b)(1), unless the authorization is terminated or revoked sooner. Performed at [facility], [address], [place], [place] [address]   CULTURE, BLOOD (ROUTINE X 2) w Reflex to ID Panel     Status: None (Preliminary result)   Collection Time: 10/28/19  8:02 AM   Specimen: BLOOD  Result Value Ref Range Status   Specimen Description BLOOD RW  Final   Special Requests   Final    BOTTLES DRAWN AEROBIC AND ANAEROBIC Blood Culture adequate volume   Culture   Final    NO GROWTH < 24 HOURS Performed at [facility], [address] [place], [place] [address]    Report Status PENDING  Incomplete  CULTURE, BLOOD (ROUTINE X 2) w Reflex to ID Panel     Status: None (Preliminary result)   Collection Time: 10/28/19  8:02 AM   Specimen: BLOOD  Result Value Ref Range Status   Specimen Description BLOOD LW  Final   Special Requests   Final    BOTTLES DRAWN AEROBIC ONLY Blood  Culture results may not be optimal due to an inadequate volume of blood received in culture bottles   Culture   Final    NO GROWTH < 24 HOURS Performed at [facility], [address] [place],  [address]    Report Status PENDING  Incomplete     Radiology Studies: CT ABDOMEN PELVIS WO CONTRAST  Result Date: 10/28/2019 CLINICAL DATA:  44 year old female with a history of fever and abdominal pain EXAM: CT ABDOMEN AND PELVIS WITHOUT CONTRAST TECHNIQUE: Multidetector CT imaging of the abdomen and pelvis was performed following the standard protocol without IV contrast. COMPARISON:  10/25/2019, 07/10/2019 FINDINGS: Lower chest: Since the prior CT there has been resolution of the peripheral ground-glass opacities in the bases of the lungs. Scarring/atelectasis at the left lung base. Trace pleural fluid. Hepatobiliary: Nodular contour of liver parenchyma with enlargement of the caudate lobe. Hyperdense material within the dependent gallbladder. Gallbladder wall without thickening or local inflammatory changes. Pancreas: Unremarkable, though incompletely characterized given presence of local inflammation in the absence of IV contrast. Spleen: Greatest diameter of the spleen measures at least 14.8 cm. Adrenals/Urinary Tract: - Right adrenal gland:  Unremarkable - Left adrenal gland: Unremarkable. - Right kidney: No hydronephrosis, nephrolithiasis, inflammation, or ureteral dilation. No focal lesion. - Left Kidney: No hydronephrosis, nephrolithiasis, inflammation, or ureteral dilation. No focal lesion. - Urinary Bladder: Partially distended. Stomach/Bowel: - Stomach: Unremarkable. - Small bowel: Enteric contrast has traversed only the proximal small bowel. Distal small bowel remains unopacified though relatively decompressed. - Appendix: Normal. - Colon: Circumferential wall thickening of the right colon and cecum. Mild stool burden. Vascular/Lymphatic: Mild atherosclerosis of the abdominal aorta.  Although vascular structures unopacified, there is redemonstration of umbilical vein recanalization secondary to portal hypertension as well as portal-portal collaterals within the upper abdomen. Reproductive: IUD within the uterus.  Otherwise unremarkable adnexa.  Other: Small-moderate volume ascites.  Mesenteric edema. Small lymph nodes in the bilateral inguinal region, unchanged from prior. Musculoskeletal: Redemonstration of compression fractures of T10, L1 without retropulsion of posterior endplate. Schmorl's node of L3 again demonstrated. Focal kyphotic deformity centered at the L1 level is again noted. Degenerative changes of the hips. No bony canal narrowing. IMPRESSION: The right colon is circumferentially thickened indicating colitis. Etiology uncertain, though portal colopathy is favored given the stigmata of cirrhosis and portal hypertension. Alternative differential diagnosis includes infectious and inflammatory. Redemonstration of cirrhosis, portal hypertension with portal collaterals of the upper abdomen, splenomegaly, and small-moderate volume ascites with mesenteric edema. Cholelithiasis without evidence of acute cholecystitis. Additional ancillary findings as above. Electronically Signed   By: Corrie Mckusick D.O.   On: 10/28/2019 10:45   DG Chest 2 View  Result Date: 10/28/2019 CLINICAL DATA:  Cough EXAM: CHEST - 2 VIEW COMPARISON:  10/23/2019 FINDINGS: Low volume chest with band of indistinct opacity at the left base. Normal heart size and mediastinal contours. No Kerley lines. No pneumothorax IMPRESSION: Atelectasis or bronchopneumonia at the left base. Electronically Signed   By: Monte Fantasia M.D.   On: 10/28/2019 11:30    Scheduled Meds: . sodium chloride   Intravenous Once  . Chlorhexidine Gluconate Cloth  6 each Topical Daily  . docusate sodium  100 mg Oral BID  . folic acid  1 mg Oral Daily  . lactulose  20 g Oral BID  . multivitamin with minerals  1 tablet Oral Daily  .  nicotine  21 mg Transdermal Daily  . pantoprazole  40 mg Oral BID  . pneumococcal 23 valent vaccine  0.5 mL Intramuscular Tomorrow-1000  . thiamine  100 mg Intravenous Daily   Continuous Infusions: . sodium chloride Stopped (10/29/19 0554)  . lactated ringers 100 mL/hr at 10/29/19 1057  . piperacillin-tazobactam (ZOSYN)  IV 3.375 g (10/29/19 0554)     LOS: 6 days   Time spent: 45 minutes.Lorella Nimrod, MD [facility]  If 7PM-7AM, please contact night-coverage [email or web address]  10/29/2019, 11:25 AM   This record has been created using Systems analyst. Errors have been sought and corrected,but may not always be located. Such creation errors do not reflect on the standard of care.

## 2019-10-29 NOTE — Progress Notes (Signed)
   10/29/19 1200  Vitals  Temp (!) 100.4 F (38 C)  Temp Source Oral  BP (!) 101/56  MAP (mmHg) 70  BP Location Left Arm  BP Method Automatic  Patient Position (if appropriate) Lying  ECG Heart Rate (!) 112  Resp 19  Level of Consciousness  Level of Consciousness Alert  MEWS Score  MEWS Temp 0  MEWS Systolic 0  MEWS Pulse 2  MEWS RR 0  MEWS LOC 0  MEWS Score 2  MEWS Score Color Yellow  notified MD Amin of temp see new orders

## 2019-10-30 LAB — CBC WITH DIFFERENTIAL/PLATELET
Abs Immature Granulocytes: 0.01 10*3/uL (ref 0.00–0.07)
Basophils Absolute: 0 10*3/uL (ref 0.0–0.1)
Basophils Relative: 1 %
Eosinophils Absolute: 0 10*3/uL (ref 0.0–0.5)
Eosinophils Relative: 0 %
HCT: 25.9 % — ABNORMAL LOW (ref 36.0–46.0)
Hemoglobin: 8.7 g/dL — ABNORMAL LOW (ref 12.0–15.0)
Immature Granulocytes: 0 %
Lymphocytes Relative: 7 %
Lymphs Abs: 0.2 10*3/uL — ABNORMAL LOW (ref 0.7–4.0)
MCH: 29.5 pg (ref 26.0–34.0)
MCHC: 33.6 g/dL (ref 30.0–36.0)
MCV: 87.8 fL (ref 80.0–100.0)
Monocytes Absolute: 0.2 10*3/uL (ref 0.1–1.0)
Monocytes Relative: 8 %
Neutro Abs: 2 10*3/uL (ref 1.7–7.7)
Neutrophils Relative %: 84 %
Platelets: 70 10*3/uL — ABNORMAL LOW (ref 150–400)
RBC: 2.95 MIL/uL — ABNORMAL LOW (ref 3.87–5.11)
RDW: 17.5 % — ABNORMAL HIGH (ref 11.5–15.5)
Smear Review: NORMAL
WBC: 2.4 10*3/uL — ABNORMAL LOW (ref 4.0–10.5)
nRBC: 0 % (ref 0.0–0.2)

## 2019-10-30 LAB — COMPREHENSIVE METABOLIC PANEL
ALT: 50 U/L — ABNORMAL HIGH (ref 0–44)
AST: 47 U/L — ABNORMAL HIGH (ref 15–41)
Albumin: 2.1 g/dL — ABNORMAL LOW (ref 3.5–5.0)
Alkaline Phosphatase: 80 U/L (ref 38–126)
Anion gap: 6 (ref 5–15)
BUN: 7 mg/dL (ref 6–20)
CO2: 21 mmol/L — ABNORMAL LOW (ref 22–32)
Calcium: 7.2 mg/dL — ABNORMAL LOW (ref 8.9–10.3)
Chloride: 107 mmol/L (ref 98–111)
Creatinine, Ser: 0.43 mg/dL — ABNORMAL LOW (ref 0.44–1.00)
GFR calc Af Amer: >60 mL/min (ref >60)
GFR calc non Af Amer: >60 mL/min (ref >60)
Glucose, Bld: 90 mg/dL (ref 70–99)
Potassium: 3.5 mmol/L (ref 3.5–5.1)
Sodium: 134 mmol/L — ABNORMAL LOW (ref 135–145)
Total Bilirubin: 2.7 mg/dL — ABNORMAL HIGH (ref 0.3–1.2)
Total Protein: 5 g/dL — ABNORMAL LOW (ref 6.5–8.1)

## 2019-10-30 LAB — PATHOLOGIST SMEAR REVIEW

## 2019-10-30 LAB — PROTIME-INR
INR: 1.6 — ABNORMAL HIGH (ref 0.8–1.2)
Prothrombin Time: 18.3 seconds — ABNORMAL HIGH (ref 11.4–15.2)

## 2019-10-30 LAB — PROCALCITONIN: Procalcitonin: 2.07 ng/mL

## 2019-10-30 MED ORDER — ENSURE ENLIVE PO LIQD
237.0000 mL | Freq: Two times a day (BID) | ORAL | Status: DC
  Administered 2019-11-01 – 2019-11-02 (×3): 237 mL via ORAL

## 2019-10-30 MED ORDER — SPIRONOLACTONE 25 MG PO TABS
25.0000 mg | ORAL_TABLET | Freq: Every day | ORAL | Status: DC
  Administered 2019-10-30 – 2019-11-02 (×4): 25 mg via ORAL
  Filled 2019-10-30 (×4): qty 1

## 2019-10-30 NOTE — Progress Notes (Addendum)
A 10 ml syring with a pink tinted liquid and white substance was found in bed. It looks like a pill was dissolved in the liquid. Pt reports she does not know what it is. Notified MD Amin. MD to bedside. Pt consent to search of room and personal items,. Found 2 lighters. Explained importance of not taking home meds or putting things in IV's. Pt Denies any knowledge of syring. Room is cleared of any syringes. Tele sitter ordered. Sent down home meds that were recovered last night to pharmacy. Pt c/o pain in abd. MD sates to give her PO pain meds and not IV at this time.

## 2019-10-30 NOTE — Progress Notes (Signed)
PROGRESS NOTE    Rhonda Martin  [id number] DOB: [birth year] DOA: 10/23/2019 PCP: Volney American, PA-C   Brief Narrative: Rhonda Martin is a 44 yo female with medical history significant for alcoholic cirrhosis, alcohol abuse,hepatitis C,, hypertension, COPD, GERD, anxiety, iron deficiency anemia, cervical cancer, tobacco abuse, thrombocytopeniaWho presented with several episodes of emesis of bright red blood over the past 2 days associated with epigastric pain. On EMS evaluation patient was found to have a systolic blood pressure initially in the 80s and reportedly came down to the 40s.  Initially treated with IV fluid, IV octreotide and IV Protonix along with 2 units of PRBC. 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   No variceal GI bleed.  No recurrent hematemesis.  Subjective: Patient was sleeping comfortably when seen today.  Easily arousable but appears very drowsy.  Overnight a bottle of spironolactone and Ambien was found in her room.  Per patient these are her prescription medications and she was taking them while in the hospital.  Patient per nursing staff later after morning rounds that they found a syringe in her bed containing a pinkish colored liquid with some powder at the base, looks like some crushed medicine mixed with saline.      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  White cell count variable, 2.4 this morning -LDH mildly elevated at 204. -Fibrinogen,-213-within  normal limit. -D-dimer-elevated at 5925 -haptoglobin low at 23. -CT abdomen with signs of colitis with some mesenteric edema. -Chest x-ray with left basilar atelectasis/infiltrate. -Get blood cultures-negative so far -UA-few leukocytes, not much concern for infection. -Procalcitonin-2.78>>3.27>>2.07 -Continue Zosyn per possible intra-abdominal infection/colitis/SBP. -Daily CBCs with differential.  Hematemesis in patient with liver cirrhosis secondary to esophagitis and Dieulafoy lesion. No esophageal varices on EGD.  No recurrent bleeding. Hemoglobin improved to 8.7. GI is following-appreciate their recommendations. -Continue twice daily Protonix. -Continue monitoring CBC. -Transfuse if below 7  Unsupervised drug use.  Patient was found to use her prescription spironolactone and Ambien last night.  Admits using them regularly while in the hospital.  This morning found to have a syringe containing pinkish liquid, which seems like some powder mixed with saline. -We will do telemetry sitter to avoid any illicit drug use or over dose. -Psych was consulted few days ago-requested them again to see her. -Husband was visiting her regularly, might have to search belongings. -Suicide precautions. -Patient continued to ask for IV pain meds.  Acute on chronic anemia in setting of acute blood loss related to above. -Patient received 2 units of PRBCs.  Currently stable. -Continue to monitor-transfuse if below 7.  Hypotension.  Most likely secondary to acute blood loss.  Resolved. -Continue to monitor.  History of alcohol abuse with concern for withdrawal.  Patient received Precedex infusion due to worsening agitation and confusion.  Per patient she does not drink regularly but her last drink was couple of days ago. Precedex was weaned off, patient seems little agitated continue to ask for more pain meds and Ativan. -  Continue to monitor with CIWA protocol.  Alcoholic liver cirrhosis.  Child pugh  class C, with a score of 11.  Elevated LFTs with hyperbilirubinemia most likely multifactorial with a combination of shock liver along with alcoholic hepatitis and hepatitis C.  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  Hepatitis B and C viral DNA is negative. Right upper quadrant ultrasound with cholelithiasis. -Continue supportive care.  Acute on chronic thrombocytopenia.  Platelet stable,  59 today. Baseline around 110.  No active bleeding. -Continue to monitor.  Concern of threatening environment at home.  See social worker's note for details. -Social workers are consulting adult and child protective services. - psych consult for underlying psych issues-pending their evaluation.  Bilateral knee pain. Being managed by orthopedic as an outpatient. -Continue pain management.  There is some concern of drug-seeking behavior as she continued to ask for more pain meds and appears lethargic. -We will try Toradol before opioids.  Objective: Vitals:   10/30/19 0021 10/30/19 0300 10/30/19 0831 10/30/19 1200  BP: 119/76 (!) 167/88 (!) 104/55   Pulse: 94 (!) 128    Resp: 16 14 17    Temp: 98.8 F (37.1 C) 98.8 F (37.1 C) 98.8 F (37.1 C) 98.7 F (37.1 C)  TempSrc: Oral Oral Oral Oral  SpO2: 99% 98% 98% 98%  Weight:      Height:       No intake or output data in the 24 hours ending 10/30/19 1257 Filed Weights   10/23/19 0248 10/23/19 1459  Weight: 54.4 kg 56.2 kg    Examination:  General exam: Frail lady, appears lethargic but comfortable. Respiratory system: Clear to auscultation. Respiratory effort normal. Cardiovascular system: S1 & S2 heard, RRR. No JVD, murmurs, rubs, Gastrointestinal system: Soft, mild diffuse tenderness, mildly distended, bowel sounds positive. Central nervous system: Lethargic. No focal neurological deficits. Extremities: No edema, no cyanosis,  pulses intact and symmetrical. Psychiatry: Judgement and insight appear normal.    DVT prophylaxis: SCDs. Code Status: Full Family Communication: Discussed with patient Disposition Plan:  Status is: Inpatient  Remains inpatient appropriate because:Inpatient level of care appropriate due to severity of illness   Dispo: The patient is from: Home              Anticipated d/c is to: To be determined              Anticipated d/c date is: 1-2 days.              Patient currently is not medically stable to d/c.  Patient developed acute onset neutropenic fever, undergoing investigation at this time.  Patient does not feel safe at home.  Social worker are trying to contact APS.  Also involved psychiatry to determine underlying psych issues.   Consultants:   GI  Psychiatry  Procedures:  EGD-10/23/2019  Antimicrobials:  Zosyn  Data Reviewed: I have personally reviewed following labs and imaging studies  CBC: Recent Labs  Lab 10/27/19 0555 10/28/19 0628 10/28/19 0803 10/29/19 0606 10/30/19 0645  WBC 5.0 0.7* 1.0* 5.0 2.4*  NEUTROABS  --   --  0.9* 3.0 2.0  HGB 8.8* 9.4* 9.4* 7.8* 8.7*  HCT 26.3* 27.8* 29.0* 24.2* 25.9*  MCV 89.5 87.4 90.6 91.0 87.8  PLT 55* 51* 52* 59* 70*   Basic Metabolic Panel: Recent Labs  Lab 10/24/19 0420 10/24/19 0420 10/25/19 [id number] 10/25/19 0439 10/26/19 [id number]  10/27/19 0555 10/28/19 0802 10/29/19 0606 10/30/19 0645  NA 137   < > 138   < > 134* 136 134* 133* 134*  K 3.5   < > 3.4*   < > 3.5 3.1* 3.3* 4.0 3.5  CL 98   < > 106   < > 108 107 105 107 107  CO2 30   < > 26   < > 22 24 21* 18* 21*  GLUCOSE 161*   < > 123*   < > 121* 125* 116* 100* 90  BUN 48*   < > 21*   < > 13 8 6 6 7   CREATININE 0.88   < > 0.51   < > 0.55 <0.30* 0.41* 0.34* 0.43*  CALCIUM 6.6*   < > 6.6*   < > 6.6* 7.0* 7.5* 6.9* 7.2*  MG 1.9  --  1.8  --   --  1.6*  --   --   --   PHOS 4.7*  --   --   --   --   --   --   --   --    < > = values in this interval not displayed.    GFR: Estimated Creatinine Clearance: 71.7 mL/min (A) (by C-G formula based on SCr of 0.43 mg/dL (L)). Liver Function Tests: Recent Labs  Lab 10/25/19 0439 10/26/19 0437 10/27/19 0555 10/29/19 0606 10/30/19 0645  AST 361* 217* 134* 76* 47*  ALT 150* 125* 94* 62* 50*  ALKPHOS 57 69 95 91 80  BILITOT 3.1* 2.4* 2.8* 2.3* 2.7*  PROT 5.2* 5.1* 5.1* 4.7* 5.0*  ALBUMIN 2.5* 2.3* 2.2* 2.0* 2.1*   No results for input(s): LIPASE, AMYLASE in the last 168 hours. No results for input(s): AMMONIA in the last 168 hours. Coagulation Profile: Recent Labs  Lab 10/26/19 0437 10/27/19 0555 10/28/19 0628 10/29/19 0606 10/30/19 0645  INR 1.6* 1.7* 1.6* 1.9* 1.6*   Cardiac Enzymes: No results for input(s): CKTOTAL, CKMB, CKMBINDEX, TROPONINI in the last 168 hours. BNP (last 3 results) No results for input(s): PROBNP in the last 8760 hours. HbA1C: No results for input(s): HGBA1C in the last 72 hours. CBG: Recent Labs  Lab 10/25/19 1946  GLUCAP 124*   Lipid Profile: No results for input(s): CHOL, HDL, LDLCALC, TRIG, CHOLHDL, LDLDIRECT in the last 72 hours. Thyroid Function Tests: No results for input(s): TSH, T4TOTAL, FREET4, T3FREE, THYROIDAB in the last 72 hours. Anemia Panel: No results for input(s): VITAMINB12, FOLATE, FERRITIN, TIBC, IRON, RETICCTPCT in the last 72 hours. Sepsis Labs: Recent Labs  Lab 10/23/19 1635 10/23/19 1756 10/28/19 1131 10/29/19 0606 10/30/19 0645  PROCALCITON  --   --  2.78 3.27 2.07  LATICACIDVEN 1.4 1.3  --   --   --     Recent Results (from the past 240 hour(s))  MRSA PCR Screening     Status: None   Collection Time: 10/23/19  2:44 AM   Specimen: Nasal Mucosa; Nasopharyngeal  Result Value Ref Range Status   MRSA by PCR NEGATIVE NEGATIVE Final    Comment:        The GeneXpert MRSA Assay (FDA approved for NASAL specimens only), is one component of a comprehensive MRSA colonization surveillance program. It is not intended to diagnose MRSA  infection nor to guide or monitor treatment for MRSA infections. Performed at [facility], [address] [place], [place] [address]   SARS Coronavirus 2 by RT PCR (hospital order, performed in [facility] hospital lab)  Nasopharyngeal Nasopharyngeal Swab     Status: None   Collection Time: 10/23/19  2:53 AM   Specimen: Nasopharyngeal Swab  Result Value Ref Range Status   SARS Coronavirus 2 NEGATIVE NEGATIVE Final    Comment: (NOTE) SARS-CoV-2 target nucleic acids are NOT DETECTED. The SARS-CoV-2 RNA is generally detectable in upper and lower respiratory specimens during the acute phase of infection. The lowest concentration of SARS-CoV-2 viral copies this assay can detect is 250 copies / mL. A negative result does not preclude SARS-CoV-2 infection and should not be used as the sole basis for treatment or other patient management decisions.  A negative result may occur with improper specimen collection / handling, submission of specimen other than nasopharyngeal swab, presence of viral mutation(s) within the areas targeted by this assay, and inadequate number of viral copies (<250 copies / mL). A negative result must be combined with clinical observations, patient history, and epidemiological information. Fact Sheet for Patients:   StrictlyIdeas.no Fact Sheet for Healthcare Providers: BankingDealers.co.za This test is not yet approved or cleared  by the [place] FDA and has been authorized for detection and/or diagnosis of SARS-CoV-2 by FDA under an Emergency Use Authorization (EUA).  This EUA will remain in effect (meaning this test can be used) for the duration of the COVID-19 declaration under Section 564(b)(1) of the Act, 21 U.S.C. section 360bbb-3(b)(1), unless the authorization is terminated or revoked sooner. Performed at [facility], [address] [place], [place] [address]   CULTURE, BLOOD  (ROUTINE X 2) w Reflex to ID Panel     Status: None (Preliminary result)   Collection Time: 10/28/19  8:02 AM   Specimen: BLOOD  Result Value Ref Range Status   Specimen Description BLOOD RW  Final   Special Requests   Final    BOTTLES DRAWN AEROBIC AND ANAEROBIC Blood Culture adequate volume   Culture   Final    NO GROWTH 2 DAYS Performed at [facility], [address] [place], [place] [address]    Report Status PENDING  Incomplete  CULTURE, BLOOD (ROUTINE X 2) w Reflex to ID Panel     Status: None (Preliminary result)   Collection Time: 10/28/19  8:02 AM   Specimen: BLOOD  Result Value Ref Range Status   Specimen Description BLOOD LW  Final   Special Requests   Final    BOTTLES DRAWN AEROBIC ONLY Blood Culture results may not be optimal due to an inadequate volume of blood received in culture bottles   Culture   Final    NO GROWTH 2 DAYS Performed at [facility], [address], [place], [place] [address]    Report Status PENDING  Incomplete     Radiology Studies: No results found.  Scheduled Meds: . Chlorhexidine Gluconate Cloth  6 each Topical Daily  . docusate sodium  100 mg Oral BID  . folic acid  1 mg Oral Daily  . lactulose  20 g Oral BID  . multivitamin with minerals  1 tablet Oral Daily  . nicotine  21 mg Transdermal Daily  . pantoprazole  40 mg Oral BID  . pneumococcal 23 valent vaccine  0.5 mL Intramuscular Tomorrow-1000  . spironolactone  25 mg Oral Daily  . thiamine  100 mg Intravenous Daily   Continuous Infusions: . sodium chloride 250 mL (10/30/19 0431)  . lactated ringers 100 mL/hr at 10/29/19 1057  . piperacillin-tazobactam (ZOSYN)  IV 3.375 g (10/30/19 1226)     LOS: 7  days   Time spent: 45 minutes.Lorella Nimrod, MD [facility]  If 7PM-7AM, please contact night-coverage [email or web address]  10/30/2019, 12:57 PM   This record has been created using Systems analyst. Errors have been sought and  corrected,but may not always be located. Such creation errors do not reflect on the standard of care.

## 2019-10-30 NOTE — Progress Notes (Addendum)
After medicating patient with morphine for pain I found empty medication bottle on the counter-asked patient about medication-she admitted to taking spirolactone and ambien(not sure how much). Sharion Settler NP notified-ordered to search patient's room and remove any medications for patient safety.  Patient gave permission to search her belongings-NT and myself searched room, found 1 bottle of spirolactone and 1 ambien in the bed and 4 on the floor.  Pt agreed to let me lock her meds and 2 lighters up.  Will continue to monitor patient for any changes.

## 2019-10-30 NOTE — Progress Notes (Signed)
PALLIATIVE NOTE:  Consult received for goals of care discussion. Chart reviewed and updates received from RN.   Attempted to see patient and engage in goals of care discussions on 2 different attempts today. Patient initially requested to return later for discussion. When I returned later patient asleep but easily awaken after getting close to the bed with verbal stimuli and touch to her arm. She awakens and requested something for pain and that she was miserable. Support given and aware that I would notify her RN to see if medication was available.   Introduced myself and Palliative's role in her care while hospitalized. She verbalized understanding and expressed she was tired and not interested in discussions at this time due to pain and discomfort. She is requesting additional pain medication. However I explained I could not order additional medication at this time. She verbalized understanding and expressed maybe she would feel up to having a discussion tomorrow or later this week.   RN aware of patient's medication request and will administer oral medications when appropriate.   Detailed note and recommendations to follow once patient is willing to have goals of care discussion.   The PMT will re-attempt to engage in discussions with patient at a later date.   Thank you for your referral and allowing the PMT to assist in Mrs. Hoecker care.   Alda Lea, AGPCNP-BC Palliative Medicine Team  Phone: [phone] Pager: [phone] Amion: N. [place]   NO CHARGE

## 2019-10-30 NOTE — Progress Notes (Signed)
   10/30/19 1740  Vitals  Temp 98.5 F (36.9 C)  Temp Source Oral  BP (!) 94/44  MAP (mmHg) (!) 59  BP Location Right Arm  BP Method Manual  Patient Position (if appropriate) Lying  Pulse Rate 98  Pulse Rate Source Monitor  ECG Heart Rate 96  Resp 16  Level of Consciousness  Level of Consciousness Alert  Oxygen Therapy  SpO2 97 %  O2 Device Room Air  MEWS Score  MEWS Temp 0  MEWS Systolic 1  MEWS Pulse 0  MEWS RR 0  MEWS LOC 0  MEWS Score 1  MEWS Score Color Green  Provider Notification  Provider Name/Title [facility]  Date Provider Notified 10/30/19  Time Provider Notified 1743  Notification Type Page  Notification Reason Other (Comment) (Low BP )  notified MD of 1600 bp 77/44. Other VSS. Pt is asymptomatic see new orders

## 2019-10-31 LAB — CBC WITH DIFFERENTIAL/PLATELET
Abs Immature Granulocytes: 0.03 10*3/uL (ref 0.00–0.07)
Basophils Absolute: 0.1 10*3/uL (ref 0.0–0.1)
Basophils Relative: 1 %
Eosinophils Absolute: 0.1 10*3/uL (ref 0.0–0.5)
Eosinophils Relative: 2 %
HCT: 25.7 % — ABNORMAL LOW (ref 36.0–46.0)
Hemoglobin: 8.4 g/dL — ABNORMAL LOW (ref 12.0–15.0)
Immature Granulocytes: 1 %
Lymphocytes Relative: 20 %
Lymphs Abs: 0.9 10*3/uL (ref 0.7–4.0)
MCH: 29.1 pg (ref 26.0–34.0)
MCHC: 32.7 g/dL (ref 30.0–36.0)
MCV: 88.9 fL (ref 80.0–100.0)
Monocytes Absolute: 0.9 10*3/uL (ref 0.1–1.0)
Monocytes Relative: 20 %
Neutro Abs: 2.7 10*3/uL (ref 1.7–7.7)
Neutrophils Relative %: 56 %
Platelets: 96 10*3/uL — ABNORMAL LOW (ref 150–400)
RBC: 2.89 MIL/uL — ABNORMAL LOW (ref 3.87–5.11)
RDW: 17.6 % — ABNORMAL HIGH (ref 11.5–15.5)
WBC: 4.7 10*3/uL (ref 4.0–10.5)
nRBC: 0 % (ref 0.0–0.2)

## 2019-10-31 LAB — COMPREHENSIVE METABOLIC PANEL
ALT: 41 U/L (ref 0–44)
AST: 43 U/L — ABNORMAL HIGH (ref 15–41)
Albumin: 2 g/dL — ABNORMAL LOW (ref 3.5–5.0)
Alkaline Phosphatase: 79 U/L (ref 38–126)
Anion gap: 5 (ref 5–15)
BUN: 6 mg/dL (ref 6–20)
CO2: 23 mmol/L (ref 22–32)
Calcium: 7.4 mg/dL — ABNORMAL LOW (ref 8.9–10.3)
Chloride: 109 mmol/L (ref 98–111)
Creatinine, Ser: 0.4 mg/dL — ABNORMAL LOW (ref 0.44–1.00)
GFR calc Af Amer: >60 mL/min (ref >60)
GFR calc non Af Amer: >60 mL/min (ref >60)
Glucose, Bld: 103 mg/dL — ABNORMAL HIGH (ref 70–99)
Potassium: 3.6 mmol/L (ref 3.5–5.1)
Sodium: 137 mmol/L (ref 135–145)
Total Bilirubin: 1.9 mg/dL — ABNORMAL HIGH (ref 0.3–1.2)
Total Protein: 4.8 g/dL — ABNORMAL LOW (ref 6.5–8.1)

## 2019-10-31 LAB — PROTIME-INR
INR: 1.4 — ABNORMAL HIGH (ref 0.8–1.2)
Prothrombin Time: 16.8 seconds — ABNORMAL HIGH (ref 11.4–15.2)

## 2019-10-31 MED ORDER — THIAMINE HCL 100 MG PO TABS
100.0000 mg | ORAL_TABLET | Freq: Every day | ORAL | Status: DC
  Administered 2019-10-31 – 2019-11-02 (×3): 100 mg via ORAL
  Filled 2019-10-31 (×3): qty 1

## 2019-10-31 MED ORDER — MIDODRINE HCL 5 MG PO TABS
5.0000 mg | ORAL_TABLET | Freq: Two times a day (BID) | ORAL | Status: DC
  Administered 2019-10-31 – 2019-11-02 (×5): 5 mg via ORAL
  Filled 2019-10-31 (×5): qty 1

## 2019-10-31 NOTE — Progress Notes (Signed)
Pharmacy Antibiotic Note  Rhonda Martin is a 44 y.o. female admitted on 10/23/2019. Concern for febrile neutropenia. Pharmacy has been consulted for Zosyn dosing.  Patient previously on ceftriaxone. Today is day 8 of IV abx.   Plan: Day 4 Zosyn 3.375 g IV q8h extended infusion  No changes needed at this time.   Height: 5\' 2"  (157.5 cm) Weight: 56.2 kg (124 lb) IBW/kg (Calculated) : 50.1  Temp (24hrs), Avg:98.8 F (37.1 C), Min:98.2 F (36.8 C), Max:99.3 F (37.4 C)  Recent Labs  Lab 10/27/19 0555 10/27/19 0555 10/28/19 0628 10/28/19 0802 10/28/19 0803 10/29/19 0606 10/30/19 0645 10/31/19 0531  WBC 5.0   < > 0.7*  --  1.0* 5.0 2.4* 4.7  CREATININE <0.30*  --   --  0.41*  --  0.34* 0.43* 0.40*   < > = values in this interval not displayed.    Estimated Creatinine Clearance: 71.7 mL/min (A) (by C-G formula based on SCr of 0.4 mg/dL (L)).    No Known Allergies  Antimicrobials this admission: 5/18 Ceftriaxone>> 5/21  5/22 Zosyn  >>   Microbiology results: 5/22 BCx: pending 5/17 MRSA PCR: negative  Thank you for allowing pharmacy to be a part of this patient's care.  Pernell Dupre, PharmD, BCPS Clinical Pharmacist 10/31/2019 9:00 AM

## 2019-10-31 NOTE — Consult Note (Signed)
Rhonda Anne Fu MD  Psychiatry Consult  10/31/19  Attempted to spend some time with [name] but she was too nauseated and uncomfortable at this time. She asked me to return in am.    Chart reviewed  Attempted to Call Husband for further info and history .  Will keep following   She could benefit from SNRI for anxiety and depression ---long standing. Unclear if she would be open to any kind of day treatment and all, depends on her medical recovery on this as well.   [name] MD

## 2019-10-31 NOTE — Progress Notes (Signed)
PROGRESS NOTE    Rhonda Martin  [id number] DOB: Aug 18, 1975 DOA: 10/23/2019 PCP: Volney American, PA-C   Brief Narrative: [name] is a 44 yo female with medical history significant for alcoholic cirrhosis, alcohol abuse,hepatitis C,, hypertension, COPD, GERD, anxiety, iron deficiency anemia, cervical cancer, tobacco abuse, thrombocytopeniaWho presented with several episodes of emesis of bright red blood over the past 2 days associated with epigastric pain. On EMS evaluation patient was found to have a systolic blood pressure initially in the 80s and reportedly came down to the 40s.  Initially treated with IV fluid, IV octreotide and IV Protonix along with 2 units of PRBC. 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   No variceal GI bleed.  No recurrent hematemesis.  Subjective: Patient was sitting in the bed when seen today.  She was complaining of abdominal pain after eating her breakfast.  Per patient her pain improves after having a BM.  She was also experiencing some bowel urgency and was little hesitant to continue with lactulose.  Discussed about taking lactulose regularly and titrated towards 1-2 soft BMs daily to prevent any encephalopathy.  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  White cell count variable, 4.7 this morning -LDH mildly elevated at 204. -Fibrinogen,-213-within normal limit. -D-dimer-elevated at 5925 -haptoglobin low at 23. -Labs more consistent with mild hemolysis  secondary to liver disease. -CT abdomen with signs of colitis with some mesenteric edema. -Chest x-ray with left basilar atelectasis/infiltrate. -Get blood cultures-negative so far -UA-few leukocytes, not much concern for infection. -Procalcitonin-2.78>>3.27>>2.07 -Continue Zosyn for 1 more day, possible intra-abdominal infection/colitis/SBP. -Daily CBCs with differential.  Hematemesis in patient with liver cirrhosis secondary to esophagitis and Dieulafoy lesion. No esophageal varices on EGD.  No recurrent bleeding. Hemoglobin stable . GI-signed off. -Continue twice daily Protonix. -Continue monitoring CBC. -Transfuse if below 7  Unsupervised drug use.  Patient was found to use her prescription spironolactone and Ambien last night.  Admits using them regularly while in the hospital. Yesterday found to have a syringe containing pinkish liquid, which seems like some powder mixed with saline. -We will do telemetry sitter to avoid any illicit drug use or over dose. -Psych was consulted few days ago-requested them again to see her. -Husband was visiting her regularly, might have to search belongings. -Suicide precautions. -Patient continued to ask for IV pain meds.  Acute on chronic anemia in setting of acute blood loss related to above. -Patient received 2 units of PRBCs.  Currently stable. -Continue to monitor-transfuse if below 7.  Hypotension.  Patient is experiencing softer blood pressure in the afternoon.  Her home dose of spironolactone was restarted due to concern of ascites. -Start her on midodrine 5 mg twice daily. -Continue to monitor.  History of alcohol abuse with concern for withdrawal.  Patient received Precedex infusion due to worsening agitation and confusion.  Per patient she does not drink regularly but her last drink was couple of days ago. Precedex was weaned off, patient seems little agitated continue to  ask for more pain meds and Ativan. -Continue to monitor with  CIWA protocol.  Alcoholic liver cirrhosis.  Child pugh class C, with a score of 11.  Elevated LFTs with hyperbilirubinemia most likely multifactorial with a combination of shock liver along with alcoholic hepatitis and hepatitis C.  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  Hepatitis B and C viral DNA is negative. Right upper quadrant ultrasound with cholelithiasis. -Continue supportive care.  Acute on chronic thrombocytopenia.  Platelet started improving, 96 today. Baseline around 110.  No active bleeding. -Continue to monitor.  Concern of threatening environment at home.  See social worker's note for details. -Social workers are consulting adult and child protective services. - psych consult for underlying psych issues-pending their evaluation.  Bilateral knee pain. Being managed by orthopedic as an outpatient. -Continue pain management.  There is some concern of drug-seeking behavior as she continued to ask for more pain meds and appears lethargic. -We will try Toradol before opioids. -Avoid IV pain meds.  Objective: Vitals:   10/30/19 2029 10/30/19 2327 10/31/19 0339 10/31/19 0800  BP: (!) 101/53 (!) 96/57 106/60 123/79  Pulse: 95 97 87 92  Resp: 17 16 16 14   Temp: 98.2 F (36.8 C) 99.3 F (37.4 C) 99.2 F (37.3 C) 99 F (37.2 C)  TempSrc: Oral Oral Oral Oral  SpO2: 97% 97% 98%   Weight:      Height:        Intake/Output Summary (Last 24 hours) at 10/31/2019 1241 Last data filed at 10/31/2019 0420 Gross per 24 hour  Intake --  Output 1400 ml  Net -1400 ml   Filed Weights   10/23/19 0248 10/23/19 1459  Weight: 54.4 kg 56.2 kg    Examination:  General exam: Frail lady, appears lethargic but comfortable. Respiratory system: Clear to auscultation. Respiratory effort normal. Cardiovascular system: S1 & S2 heard, RRR. No JVD, murmurs, rubs, Gastrointestinal  system: Soft, mild diffuse tenderness, mildly distended, bowel sounds positive. Central nervous system: Alert and oriented, no focal neurological deficits. Extremities: No edema, no cyanosis, pulses intact and symmetrical. Psychiatry: Judgement and insight appear normal.    DVT prophylaxis: SCDs. Code Status: Full Family Communication: Discussed with patient Disposition Plan:  Status is: Inpatient  Remains inpatient appropriate because:Inpatient level of care appropriate due to severity of illness   Dispo: The patient is from: Home              Anticipated d/c is to: To be determined              Anticipated d/c date is: 1 days.              Patient currently is not medically stable to d/c.  Colitis seems improving, labs improving, should be able to go home tomorrow.  Patient does not feel safe at home.  Social worker are trying to contact APS.  Also involved psychiatry to determine underlying psych issues.   Consultants:   GI  Psychiatry  Procedures:  EGD-10/23/2019  Antimicrobials:  Zosyn  Data Reviewed: I have personally reviewed following labs and imaging studies  CBC: Recent Labs  Lab 10/28/19 0628 10/28/19 0803 10/29/19 0606 10/30/19 0645 10/31/19 0531  WBC 0.7* 1.0* 5.0 2.4* 4.7  NEUTROABS  --  0.9* 3.0 2.0 2.7  HGB 9.4* 9.4* 7.8* 8.7* 8.4*  HCT 27.8* 29.0* 24.2* 25.9* 25.7*  MCV  87.4 90.6 91.0 87.8 88.9  PLT 51* 52* 59* 70* 96*   Basic Metabolic Panel: Recent Labs  Lab 10/25/19 0439 10/26/19 0437 10/27/19 0555 10/28/19 0802 10/29/19 0606 10/30/19 0645 10/31/19 0531  NA 138   < > 136 134* 133* 134* 137  K 3.4*   < > 3.1* 3.3* 4.0 3.5 3.6  CL 106   < > 107 105 107 107 109  CO2 26   < > 24 21* 18* 21* 23  GLUCOSE 123*   < > 125* 116* 100* 90 103*  BUN 21*   < > 8 6 6 7 6   CREATININE 0.51   < > <0.30* 0.41* 0.34* 0.43* 0.40*  CALCIUM 6.6*   < > 7.0* 7.5* 6.9* 7.2* 7.4*  MG 1.8  --  1.6*  --   --   --   --    < > = values in this interval not  displayed.   GFR: Estimated Creatinine Clearance: 71.7 mL/min (A) (by C-G formula based on SCr of 0.4 mg/dL (L)). Liver Function Tests: Recent Labs  Lab 10/26/19 0437 10/27/19 0555 10/29/19 0606 10/30/19 0645 10/31/19 0531  AST 217* 134* 76* 47* 43*  ALT 125* 94* 62* 50* 41  ALKPHOS 69 95 91 80 79  BILITOT 2.4* 2.8* 2.3* 2.7* 1.9*  PROT 5.1* 5.1* 4.7* 5.0* 4.8*  ALBUMIN 2.3* 2.2* 2.0* 2.1* 2.0*   No results for input(s): LIPASE, AMYLASE in the last 168 hours. No results for input(s): AMMONIA in the last 168 hours. Coagulation Profile: Recent Labs  Lab 10/27/19 0555 10/28/19 0628 10/29/19 0606 10/30/19 0645 10/31/19 0531  INR 1.7* 1.6* 1.9* 1.6* 1.4*   Cardiac Enzymes: No results for input(s): CKTOTAL, CKMB, CKMBINDEX, TROPONINI in the last 168 hours. BNP (last 3 results) No results for input(s): PROBNP in the last 8760 hours. HbA1C: No results for input(s): HGBA1C in the last 72 hours. CBG: Recent Labs  Lab 10/25/19 1946  GLUCAP 124*   Lipid Profile: No results for input(s): CHOL, HDL, LDLCALC, TRIG, CHOLHDL, LDLDIRECT in the last 72 hours. Thyroid Function Tests: No results for input(s): TSH, T4TOTAL, FREET4, T3FREE, THYROIDAB in the last 72 hours. Anemia Panel: No results for input(s): VITAMINB12, FOLATE, FERRITIN, TIBC, IRON, RETICCTPCT in the last 72 hours. Sepsis Labs: Recent Labs  Lab 10/28/19 1131 10/29/19 0606 10/30/19 0645  PROCALCITON 2.78 3.27 2.07    Recent Results (from the past 240 hour(s))  MRSA PCR Screening     Status: None   Collection Time: 10/23/19  2:44 AM   Specimen: Nasal Mucosa; Nasopharyngeal  Result Value Ref Range Status   MRSA by PCR NEGATIVE NEGATIVE Final    Comment:        The GeneXpert MRSA Assay (FDA approved for NASAL specimens only), is one component of a comprehensive MRSA colonization surveillance program. It is not intended to diagnose MRSA infection nor to guide or monitor treatment for MRSA  infections. Performed at [facility], [address] [place], [place] [address]   SARS Coronavirus 2 by RT PCR (hospital order, performed in [facility] hospital lab) Nasopharyngeal Nasopharyngeal Swab     Status: None   Collection Time: 10/23/19  2:53 AM   Specimen: Nasopharyngeal Swab  Result Value Ref Range Status   SARS Coronavirus 2 NEGATIVE NEGATIVE Final    Comment: (NOTE) SARS-CoV-2 target nucleic acids are NOT DETECTED. The SARS-CoV-2 RNA is generally detectable in upper and lower respiratory specimens during the acute phase of infection. The  lowest concentration of SARS-CoV-2 viral copies this assay can detect is 250 copies / mL. A negative result does not preclude SARS-CoV-2 infection and should not be used as the sole basis for treatment or other patient management decisions.  A negative result may occur with improper specimen collection / handling, submission of specimen other than nasopharyngeal swab, presence of viral mutation(s) within the areas targeted by this assay, and inadequate number of viral copies (<250 copies / mL). A negative result must be combined with clinical observations, patient history, and epidemiological information. Fact Sheet for Patients:   StrictlyIdeas.no Fact Sheet for Healthcare Providers: BankingDealers.co.za This test is not yet approved or cleared  by the [place] FDA and has been authorized for detection and/or diagnosis of SARS-CoV-2 by FDA under an Emergency Use Authorization (EUA).  This EUA will remain in effect (meaning this test can be used) for the duration of the COVID-19 declaration under Section 564(b)(1) of the Act, 21 U.S.C. section 360bbb-3(b)(1), unless the authorization is terminated or revoked sooner. Performed at [facility], [address] [place], [place] [address]   CULTURE, BLOOD (ROUTINE X 2) w Reflex to ID Panel     Status: None  (Preliminary result)   Collection Time: 10/28/19  8:02 AM   Specimen: BLOOD  Result Value Ref Range Status   Specimen Description BLOOD RW  Final   Special Requests   Final    BOTTLES DRAWN AEROBIC AND ANAEROBIC Blood Culture adequate volume   Culture   Final    NO GROWTH 3 DAYS Performed at [facility], [address], [place], [place] [address]    Report Status PENDING  Incomplete  CULTURE, BLOOD (ROUTINE X 2) w Reflex to ID Panel     Status: None (Preliminary result)   Collection Time: 10/28/19  8:02 AM   Specimen: BLOOD  Result Value Ref Range Status   Specimen Description BLOOD LW  Final   Special Requests   Final    BOTTLES DRAWN AEROBIC ONLY Blood Culture results may not be optimal due to an inadequate volume of blood received in culture bottles   Culture   Final    NO GROWTH 3 DAYS Performed at [facility], [address] [place], [place] [address]    Report Status PENDING  Incomplete     Radiology Studies: No results found.  Scheduled Meds: . Chlorhexidine Gluconate Cloth  6 each Topical Daily  . docusate sodium  100 mg Oral BID  . feeding supplement (ENSURE ENLIVE)  237 mL Oral BID BM  . folic acid  1 mg Oral Daily  . lactulose  20 g Oral BID  . midodrine  5 mg Oral BID WC  . multivitamin with minerals  1 tablet Oral Daily  . nicotine  21 mg Transdermal Daily  . pantoprazole  40 mg Oral BID  . pneumococcal 23 valent vaccine  0.5 mL Intramuscular Tomorrow-1000  . spironolactone  25 mg Oral Daily  . thiamine  100 mg Oral Daily   Continuous Infusions: . sodium chloride 250 mL (10/30/19 0431)  . lactated ringers 100 mL/hr at 10/30/19 2040  . piperacillin-tazobactam (ZOSYN)  IV 3.375 g (10/31/19 0540)     LOS: 8 days   Time spent: 40 minutes.Lorella Nimrod, MD [facility]  If 7PM-7AM, please contact night-coverage [email or web address]  10/31/2019, 12:41 PM   This record has been created using Systems analyst.  Errors have been sought and corrected,but may not always  be located. Such creation errors do not reflect on the standard of care.

## 2019-10-31 NOTE — Progress Notes (Signed)
Nutrition Follow-up  DOCUMENTATION CODES:   Not applicable  INTERVENTION:  Continue Ensure Enlive po BID, each supplement provides 350 kcal and 20 grams of protein. Patient prefers chocolate.  NUTRITION DIAGNOSIS:   Increased nutrient needs related to catabolic illness(COPD, cirrhosis) as evidenced by estimated needs.  Ongoing.  GOAL:   Patient will meet greater than or equal to 90% of their needs  Progressing.  MONITOR:   PO intake, Supplement acceptance, Diet advancement, Labs, Weight trends, I & O's  REASON FOR ASSESSMENT:   Malnutrition Screening Tool    ASSESSMENT:   44 year old female with PMHx of HTN, Charcot-Marie-Tooth disease, hepatitis C, cirrhosis, COPD, GERD, cervical cancer, iron deficiency anemia, anxiety, EtOH use admitted with hematemesis, acute on chronic anemia.  Met with patient at bedside. She reports her appetite is much better now and that she is eating well at meals. She reports she is eating 75-100% of her meals. Limited documentation in chart. Patient reports she enjoys Ensure and would like to drink between meals. She reports she has not been brought a bottle yet today but according to chart patient refused her bottle of Ensure this morning. Encouraged patient to drink Ensure BID between meals.  Medications reviewed and include: Colace 480 mg BID, folic acid 1 mg daily, lactulose 20 grams BID, MVI daily, nicotine patch, Protonix, spironolactone, thiamine 100 mg daily, Zosyn.  Labs reviewed: Creatinine 0.4.  Diet Order:   Diet Order            DIET SOFT Room service appropriate? Yes; Fluid consistency: Thin  Diet effective now             EDUCATION NEEDS:   Education needs have been addressed  Skin:  Skin Assessment: Reviewed RN Assessment  Last BM:  10/31/2019 - large type 7  Height:   Ht Readings from Last 1 Encounters:  10/23/19 5' 2"  (1.575 m)   Weight:   Wt Readings from Last 1 Encounters:  10/23/19 56.2 kg   BMI:  Body  mass index is 22.68 kg/m.  Estimated Nutritional Needs:   Kcal:  1600-1800  Protein:  80-90 grams  Fluid:  1.6-1.8 L/day  Jacklynn Barnacle, MS, RD, LDN Pager number available on Amion

## 2019-10-31 NOTE — Plan of Care (Signed)
PMT note:  Consult noted. Per EMR, APS has been requested for evaluation as well as a psychiatric consult. The PMT will await these decisions: on if patient is deemed to have capacity, and if not, who the decision maker will be. Thank you.

## 2019-11-01 LAB — CBC WITH DIFFERENTIAL/PLATELET
Abs Immature Granulocytes: 0.03 10*3/uL (ref 0.00–0.07)
Basophils Absolute: 0.1 10*3/uL (ref 0.0–0.1)
Basophils Relative: 1 %
Eosinophils Absolute: 0.1 10*3/uL (ref 0.0–0.5)
Eosinophils Relative: 2 %
HCT: 27.5 % — ABNORMAL LOW (ref 36.0–46.0)
Hemoglobin: 9 g/dL — ABNORMAL LOW (ref 12.0–15.0)
Immature Granulocytes: 1 %
Lymphocytes Relative: 20 %
Lymphs Abs: 1.1 10*3/uL (ref 0.7–4.0)
MCH: 28.8 pg (ref 26.0–34.0)
MCHC: 32.7 g/dL (ref 30.0–36.0)
MCV: 87.9 fL (ref 80.0–100.0)
Monocytes Absolute: 0.8 10*3/uL (ref 0.1–1.0)
Monocytes Relative: 15 %
Neutro Abs: 3.4 10*3/uL (ref 1.7–7.7)
Neutrophils Relative %: 61 %
Platelets: 125 10*3/uL — ABNORMAL LOW (ref 150–400)
RBC: 3.13 MIL/uL — ABNORMAL LOW (ref 3.87–5.11)
RDW: 17.6 % — ABNORMAL HIGH (ref 11.5–15.5)
WBC: 5.5 10*3/uL (ref 4.0–10.5)
nRBC: 0 % (ref 0.0–0.2)

## 2019-11-01 LAB — PROTIME-INR
INR: 1.5 — ABNORMAL HIGH (ref 0.8–1.2)
Prothrombin Time: 17.4 seconds — ABNORMAL HIGH (ref 11.4–15.2)

## 2019-11-01 MED ORDER — DULOXETINE HCL 20 MG PO CPEP
20.0000 mg | ORAL_CAPSULE | Freq: Every day | ORAL | Status: DC
  Administered 2019-11-01 – 2019-11-02 (×2): 20 mg via ORAL
  Filled 2019-11-01 (×3): qty 1

## 2019-11-01 NOTE — Evaluation (Signed)
Physical Therapy Evaluation Patient Details Name: Rhonda Martin MRN: [id number] DOB: January 13, 1976 Today's Date: 11/01/2019   History of Present Illness  Pt is a 44 y.o. female presenting to hospital with nausea, vomiting (bloody emesis).  Pt admitted with hematemesis with severe anemia in setting of liver cirrhosis and chronic Hep C.  S/p EGD 5/17.  Pt noted with neutropenic fever, unsupervised drug use, and hypotension during hospitalization.  PMH includes alcohol abuse, Hep C, liver cirrhosis, htn, COPD, h/o back injury, cervical CA, Charcot-Marie-Tooth disease.  Clinical Impression  Prior to hospital admission, pt was modified independent ambulating with SPC vs RW depending on how she was feeling; lives with her husband in 1 level home with 4 STE with L railing.  Currently pt is modified independent with bed mobility and transfers to/from [facility].  Pt reporting recent fall and did not want to walk without her R LE brace (pt reports she came via EMS so she did not have it here but would ask her husband to bring it).  Pt reports she would be able to "bump" herself up on steps and use manual w/c in home if she was unable to walk.  Pt would benefit from skilled PT to address noted impairments and functional limitations (see below for any additional details).  Upon hospital discharge, pt would benefit from [facility].    Follow Up Recommendations Home health PT;Supervision for mobility/OOB    Equipment Recommendations  3in1 (PT)(pt has RW at home already)    Recommendations for Other Services       Precautions / Restrictions Precautions Precautions: Fall Precaution Comments: Protective precautions Required Braces or Orthoses: Other Brace Other Brace: Pt reports using R LE brace when walking Restrictions Weight Bearing Restrictions: No      Mobility  Bed Mobility Overal bed mobility: Modified Independent             General bed mobility comments: Semi-supine to/from sitting without any noted  difficulties  Transfers Overall transfer level: Modified independent Equipment used: None             General transfer comment: stand step turn bed to/from [facility] (x2 trials); no difficulties noted  Ambulation/Gait             General Gait Details: pt declined d/t recent fall and not having R LE brace here to use  Stairs            Wheelchair Mobility    Modified Rankin (Stroke Patients Only)       Balance Overall balance assessment: Needs assistance Sitting-balance support: No upper extremity supported;Feet supported Sitting balance-Leahy Scale: Normal Sitting balance - Comments: steady sitting reaching outside BOS   Standing balance support: During functional activity Standing balance-Leahy Scale: Good Standing balance comment: steady transfers with UE support                             Pertinent Vitals/Pain Pain Assessment: 0-10 Pain Location: chronic R knee and back pain Pain Descriptors / Indicators: Aching;Sore Pain Intervention(s): Limited activity within patient's tolerance;Monitored during session;Repositioned  Vitals (HR and O2 on room air) stable and WFL throughout treatment session.    Home Living Family/patient expects to be discharged to:: Private residence Living Arrangements: Spouse/significant other Available Help at Discharge: Family;Available 24 hours/day Type of Home: Mobile home Home Access: Stairs to enter Entrance Stairs-Rails: Left Entrance Stairs-Number of Steps: 4 Home Layout: One level Home Equipment: Clinical cytogeneticist - 2 wheels;Cane -  single point;Wheelchair - manual      Prior Function Level of Independence: Independent         Comments: Ambulates with cane vs RW depending on how she is feeling.  H/o multiple falls.     Hand Dominance        Extremity/Trunk Assessment   Upper Extremity Assessment Upper Extremity Assessment: Generalized weakness    Lower Extremity Assessment Lower Extremity  Assessment: Generalized weakness    Cervical / Trunk Assessment Cervical / Trunk Assessment: Normal  Communication   Communication: No difficulties  Cognition Arousal/Alertness: Awake/alert Behavior During Therapy: WFL for tasks assessed/performed Overall Cognitive Status: Within Functional Limits for tasks assessed                                        General Comments   Nursing cleared pt for participation in physical therapy.  Pt agreeable to PT session.    Exercises     Assessment/Plan    PT Assessment Patient needs continued PT services  PT Problem List Decreased strength;Decreased activity tolerance;Decreased balance;Decreased mobility;Pain       PT Treatment Interventions DME instruction;Gait training;Stair training;Functional mobility training;Therapeutic activities;Therapeutic exercise;Balance training;Patient/family education    PT Goals (Current goals can be found in the Care Plan section)  Acute Rehab PT Goals Patient Stated Goal: to go home PT Goal Formulation: With patient Time For Goal Achievement: 11/15/19 Potential to Achieve Goals: Good    Frequency Min 2X/week   Barriers to discharge        Co-evaluation               AM-PAC PT "6 Clicks" Mobility  Outcome Measure Help needed turning from your back to your side while in a flat bed without using bedrails?: None Help needed moving from lying on your back to sitting on the side of a flat bed without using bedrails?: None Help needed moving to and from a bed to a chair (including a wheelchair)?: None Help needed standing up from a chair using your arms (e.g., wheelchair or bedside chair)?: None Help needed to walk in hospital room?: A Little Help needed climbing 3-5 steps with a railing? : A Little 6 Click Score: 22    End of Session Equipment Utilized During Treatment: Gait belt Activity Tolerance: Patient tolerated treatment well Patient left: in bed;with call bell/phone  within reach;with bed alarm set Nurse Communication: Mobility status;Precautions;Other (comment)(pt needing R LE brace to walk) PT Visit Diagnosis: Other abnormalities of gait and mobility (R26.89);Muscle weakness (generalized) (M62.81);History of falling (Z91.81);Difficulty in walking, not elsewhere classified (R26.2)    Time: 1350-1410 PT Time Calculation (min) (ACUTE ONLY): 20 min   Charges:   PT Evaluation $PT Eval Low Complexity: 1 Low         Glenn Christo, PT 11/01/19, 4:51 PM
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
## 2019-11-01 NOTE — Significant Event (Signed)
Pt attempted to ambulate to door with cane and the cane slipped from under her causing her to fall to her knees. Tele Sitter active. Pt denies hitting her head or having any injuries. VSS not deviations from baseline at this time. MD notified, RN will continue to monitor.   11/01/19 1403  What Happened  Was fall witnessed? Yes  Who witnessed fall? Tele Sitter  Patients activity before fall ambulating-unassisted  Point of contact other (comment) (Knee)  Was patient injured? No  Follow Up  MD notified Lorella Nimrod, MD  Time MD notified [phone]  Family notified No - patient refusal  Additional tests No  Simple treatment Other (comment) (None Needed)  Progress note created (see row info) Yes  Adult Fall Risk Assessment  Risk Factor Category (scoring not indicated) Fall has occurred during this admission (document High fall risk)  Age 44  Fall History: Fall within 6 months prior to admission 0  Elimination; Bowel and/or Urine Incontinence 0  Elimination; Bowel and/or Urine Urgency/Frequency 0  Medications: includes PCA/Opiates, Anti-convulsants, Anti-hypertensives, Diuretics, Hypnotics, Laxatives, Sedatives, and Psychotropics 5  Patient Care Equipment 2  Mobility-Assistance 2  Mobility-Gait 2  Mobility-Sensory Deficit 0  Altered awareness of immediate physical environment 0  Impulsiveness 0  Lack of understanding of one's physical/cognitive limitations 0  Total Score 11  Patient Fall Risk Level High fall risk  Adult Fall Risk Interventions  Required Bundle Interventions *See Row Information* High fall risk - low, moderate, and high requirements implemented  Additional Interventions Camera surveillance (with patient/family notification & education);Use of appropriate toileting equipment (bedpan, BSC, etc.)  Screening for Fall Injury Risk (To be completed on HIGH fall risk patients) - Assessing Need for Low Bed  Risk For Fall Injury- Low Bed Criteria TeleSitter Camera in use  Will  Implement Low Bed and Floor Mats No - Criteria no longer met for low bed  Screening for Fall Injury Risk (To be completed on HIGH fall risk patients who do not meet crieteria for Low Bed) - Assessing Need for Floor Mats Only  Risk For Fall Injury- Criteria for Floor Mats None identified - No additional interventions needed  Will Implement Floor Mats Yes  Vitals  Temp 98.9 F (37.2 C)  Temp Source Oral  BP 119/66  MAP (mmHg) 83  BP Location Left Arm  BP Method Automatic  Patient Position (if appropriate) Lying  Pulse Rate (!) 102  Pulse Rate Source Monitor  ECG Heart Rate (!) 102  Resp 19  Neurological  Neuro (WDL) WDL  Level of Consciousness Alert  Orientation Level Oriented X4  Cognition Appropriate at baseline  Speech Clear  Pupil Assessment  No  Glasgow Coma Scale  Eye Opening 4  Best Verbal Response (NON-intubated) 5  Modified Verbal Response (INTUBATED) 5  Best Motor Response 6  Glasgow Coma Scale Score (!) 20  Musculoskeletal  Musculoskeletal (WDL) WDL  Integumentary  Integumentary (WDL) X  Skin Color Appropriate for ethnicity  Skin Condition Dry  Skin Integrity Intact  Skin Turgor Non-tenting

## 2019-11-01 NOTE — Progress Notes (Signed)
PROGRESS NOTE    Rhonda Martin  [id number] DOB: 08/31/1975 DOA: 10/23/2019 PCP: Volney American, PA-C   Brief Narrative: Rhonda Martin is a 44 yo female with medical history significant for alcoholic cirrhosis, alcohol abuse,hepatitis C,, hypertension, COPD, GERD, anxiety, iron deficiency anemia, cervical cancer, tobacco abuse, thrombocytopeniaWho presented with several episodes of emesis of bright red blood over the past 2 days associated with epigastric pain. On EMS evaluation patient was found to have a systolic blood pressure initially in the 80s and reportedly came down to the 40s.  Initially treated with IV fluid, IV octreotide and IV Protonix along with 2 units of PRBC. 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   No variceal GI bleed.  No recurrent hematemesis.  Subjective: Patient was feeling little better today. Some improvement in her belly pain. She was intermittently tearful again.She was agreeable to talk with Psych today.   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  White cell count variable, 5.5 this morning -LDH mildly elevated at 204. -Fibrinogen,-213-within normal limit. -D-dimer-elevated at 5925 -haptoglobin low at 23. -Labs more consistent with mild hemolysis secondary to liver disease. -CT abdomen with signs of colitis with some mesenteric edema. -Chest x-ray with left basilar atelectasis/infiltrate. -Get blood cultures-negative so far -UA-few leukocytes, not much  concern for infection. -Procalcitonin-2.78>>3.27>>2.07 - possible intra-abdominal infection/colitis/SBP. -Will complete her zocyn today. -Daily CBCs with differential.  Hematemesis in patient with liver cirrhosis secondary to esophagitis and Dieulafoy lesion. No esophageal varices on EGD.  No recurrent bleeding. Hemoglobin stable . GI-signed off. -Continue twice daily Protonix. -Continue monitoring CBC. -Transfuse if below 7  Unsupervised drug use.  Patient was found to use her prescription spironolactone and Ambien last night.  Admits using them regularly while in the hospital. Yesterday found to have a syringe containing pinkish liquid, which seems like some powder mixed with saline. -Continue telemetry sitter to avoid any illicit drug use or over dose. -Husband was visiting her regularly, might have to search belongings. - Psych saw her today and started her on cymbalta- need to follow up with them as out patient. -Suicide precautions. -Patient continued to ask for IV pain meds.  Acute on chronic anemia in setting of acute blood loss related to above.Hge is improving. -Patient received 2 units of PRBCs.  Currently stable. -Continue to monitor-transfuse if below 7.  Hypotension.  Patient was experiencing softer blood pressure in the afternoon.  Her home dose of spironolactone was restarted due to concern of ascites. -Continue midodrine 5 mg twice daily. -Continue to monitor.  History of alcohol abuse with concern for withdrawal.  Patient received Precedex infusion due to worsening agitation and confusion.  Per patient she does not drink regularly but her last drink was couple of days ago. Precedex was weaned off, patient seems little agitated continue to ask for more pain meds and Ativan. -Continue to monitor with CIWA protocol.  Alcoholic liver cirrhosis.  Child pugh class C, with a score of 11.  Elevated LFTs with hyperbilirubinemia  most likely multifactorial with a combination of  shock liver along with alcoholic hepatitis and hepatitis C.  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  Hepatitis B and C viral DNA is negative. Right upper quadrant ultrasound with cholelithiasis. -Continue supportive care.  Acute on chronic thrombocytopenia.  Platelet started improving, 125 today. Baseline around 110.  No active bleeding. -Continue to monitor.  Concern of threatening environment at home.  See social worker's note for details. -Social workers are consulting adult and child protective services. - psych consult for underlying psych issues-pending their evaluation.  Bilateral knee pain. Being managed by orthopedic as an outpatient. -Continue pain management.  There is some concern of drug-seeking behavior as she continued to ask for more pain meds and appears lethargic. -We will try Toradol before opioids. -Avoid IV pain meds.  Objective: Vitals:   11/01/19 1403 11/01/19 1500 11/01/19 2000 11/01/19 2034  BP: 119/66 (!) 111/58 132/71   Pulse: (!) 102     Resp: 19 15 14    Temp: 98.9 F (37.2 C)   98.8 F (37.1 C)  TempSrc: Oral   Oral  SpO2:      Weight:      Height:        Intake/Output Summary (Last 24 hours) at 11/01/2019 2147 Last data filed at 11/01/2019 0747 Gross per 24 hour  Intake 120 ml  Output 2100 ml  Net -1980 ml   Filed Weights   10/23/19 0248 10/23/19 1459  Weight: 54.4 kg 56.2 kg    Examination:  General exam: Frail lady, tearful but comfortable. Respiratory system: Clear to auscultation. Respiratory effort normal. Cardiovascular system: S1 & S2 heard, RRR. No JVD, murmurs, rubs, Gastrointestinal system: Soft, mild diffuse tenderness, mildly distended, bowel sounds positive. Central nervous system: Alert and oriented, no focal neurological deficits. Extremities: No edema, no cyanosis, pulses intact and symmetrical. Psychiatry:  Judgement and insight appear normal.    DVT prophylaxis: SCDs. Code Status: Full Family Communication: Discussed with patient Disposition Plan:  Status is: Inpatient  Remains inpatient appropriate because:Inpatient level of care appropriate due to severity of illness   Dispo: The patient is from: Home              Anticipated d/c is to: To be determined              Anticipated d/c date is: 1 days.              Patient currently is not medically stable to d/c.  Colitis seems improving, labs improving, should be able to go home tomorrow.  Patient does not feel safe at home.  Social worker are trying to contact APS.  Also involved psychiatry to determine underlying psych issues.   Consultants:   GI  Psychiatry  Procedures:  EGD-10/23/2019  Antimicrobials:  Zosyn  Data Reviewed: I have personally reviewed following labs and imaging studies  CBC: Recent Labs  Lab 10/28/19 0803 10/29/19 0606 10/30/19 0645 10/31/19 0531 11/01/19 0417  WBC 1.0* 5.0 2.4* 4.7 5.5  NEUTROABS 0.9* 3.0 2.0 2.7 3.4  HGB 9.4* 7.8* 8.7* 8.4* 9.0*  HCT 29.0* 24.2* 25.9* 25.7* 27.5*  MCV 90.6 91.0 87.8 88.9 87.9  PLT 52* 59* 70* 96* 0000000*   Basic Metabolic Panel: Recent Labs  Lab 10/27/19 0555 10/28/19 0802 10/29/19 0606 10/30/19 0645 10/31/19 0531  NA 136 134* 133* 134* 137  K  3.1* 3.3* 4.0 3.5 3.6  CL 107 105 107 107 109  CO2 24 21* 18* 21* 23  GLUCOSE 125* 116* 100* 90 103*  BUN 8 6 6 7 6   CREATININE <0.30* 0.41* 0.34* 0.43* 0.40*  CALCIUM 7.0* 7.5* 6.9* 7.2* 7.4*  MG 1.6*  --   --   --   --    GFR: Estimated Creatinine Clearance: 71.7 mL/min (A) (by C-G formula based on SCr of 0.4 mg/dL (L)). Liver Function Tests: Recent Labs  Lab 10/26/19 0437 10/27/19 0555 10/29/19 0606 10/30/19 0645 10/31/19 0531  AST 217* 134* 76* 47* 43*  ALT 125* 94* 62* 50* 41  ALKPHOS 69 95 91 80 79  BILITOT 2.4* 2.8* 2.3* 2.7* 1.9*  PROT 5.1* 5.1* 4.7* 5.0* 4.8*  ALBUMIN 2.3* 2.2* 2.0* 2.1*  2.0*   No results for input(s): LIPASE, AMYLASE in the last 168 hours. No results for input(s): AMMONIA in the last 168 hours. Coagulation Profile: Recent Labs  Lab 10/28/19 0628 10/29/19 0606 10/30/19 0645 10/31/19 0531 11/01/19 0417  INR 1.6* 1.9* 1.6* 1.4* 1.5*   Cardiac Enzymes: No results for input(s): CKTOTAL, CKMB, CKMBINDEX, TROPONINI in the last 168 hours. BNP (last 3 results) No results for input(s): PROBNP in the last 8760 hours. HbA1C: No results for input(s): HGBA1C in the last 72 hours. CBG: No results for input(s): GLUCAP in the last 168 hours. Lipid Profile: No results for input(s): CHOL, HDL, LDLCALC, TRIG, CHOLHDL, LDLDIRECT in the last 72 hours. Thyroid Function Tests: No results for input(s): TSH, T4TOTAL, FREET4, T3FREE, THYROIDAB in the last 72 hours. Anemia Panel: No results for input(s): VITAMINB12, FOLATE, FERRITIN, TIBC, IRON, RETICCTPCT in the last 72 hours. Sepsis Labs: Recent Labs  Lab 10/28/19 1131 10/29/19 0606 10/30/19 0645  PROCALCITON 2.78 3.27 2.07    Recent Results (from the past 240 hour(s))  MRSA PCR Screening     Status: None   Collection Time: 10/23/19  2:44 AM   Specimen: Nasal Mucosa; Nasopharyngeal  Result Value Ref Range Status   MRSA by PCR NEGATIVE NEGATIVE Final    Comment:        The GeneXpert MRSA Assay (FDA approved for NASAL specimens only), is one component of a comprehensive MRSA colonization surveillance program. It is not intended to diagnose MRSA infection nor to guide or monitor treatment for MRSA infections. Performed at [facility], [address] [place], [place] [address]   SARS Coronavirus 2 by RT PCR (hospital order, performed in [facility] hospital lab) Nasopharyngeal Nasopharyngeal Swab     Status: None   Collection Time: 10/23/19  2:53 AM   Specimen: Nasopharyngeal Swab  Result Value Ref Range Status   SARS Coronavirus 2 NEGATIVE NEGATIVE Final    Comment: (NOTE) SARS-CoV-2  target nucleic acids are NOT DETECTED. The SARS-CoV-2 RNA is generally detectable in upper and lower respiratory specimens during the acute phase of infection. The lowest concentration of SARS-CoV-2 viral copies this assay can detect is 250 copies / mL. A negative result does not preclude SARS-CoV-2 infection and should not be used as the sole basis for treatment or other patient management decisions.  A negative result may occur with improper specimen collection / handling, submission of specimen other than nasopharyngeal swab, presence of viral mutation(s) within the areas targeted by this assay, and inadequate number of viral copies (<250 copies / mL). A negative result must be combined with clinical observations, patient history, and epidemiological information. Fact Sheet for Patients:   StrictlyIdeas.no  Fact Sheet for Healthcare Providers: BankingDealers.co.za This test is not yet approved or cleared  by the [place] FDA and has been authorized for detection and/or diagnosis of SARS-CoV-2 by FDA under an Emergency Use Authorization (EUA).  This EUA will remain in effect (meaning this test can be used) for the duration of the COVID-19 declaration under Section 564(b)(1) of the Act, 21 U.S.C. section 360bbb-3(b)(1), unless the authorization is terminated or revoked sooner. Performed at [facility], [address] [place], [place] [address]   CULTURE, BLOOD (ROUTINE X 2) w Reflex to ID Panel     Status: None (Preliminary result)   Collection Time: 10/28/19  8:02 AM   Specimen: BLOOD  Result Value Ref Range Status   Specimen Description BLOOD RW  Final   Special Requests   Final    BOTTLES DRAWN AEROBIC AND ANAEROBIC Blood Culture adequate volume   Culture   Final    NO GROWTH 4 DAYS Performed at [facility], [address] [place], [place] [address]    Report Status PENDING  Incomplete  CULTURE, BLOOD  (ROUTINE X 2) w Reflex to ID Panel     Status: None (Preliminary result)   Collection Time: 10/28/19  8:02 AM   Specimen: BLOOD  Result Value Ref Range Status   Specimen Description BLOOD LW  Final   Special Requests   Final    BOTTLES DRAWN AEROBIC ONLY Blood Culture results may not be optimal due to an inadequate volume of blood received in culture bottles   Culture   Final    NO GROWTH 4 DAYS Performed at [facility], [address] [place], [place] [address]    Report Status PENDING  Incomplete     Radiology Studies: No results found.  Scheduled Meds: . Chlorhexidine Gluconate Cloth  6 each Topical Daily  . docusate sodium  100 mg Oral BID  . DULoxetine  20 mg Oral Daily  . feeding supplement (ENSURE ENLIVE)  237 mL Oral BID BM  . folic acid  1 mg Oral Daily  . lactulose  20 g Oral BID  . midodrine  5 mg Oral BID WC  . multivitamin with minerals  1 tablet Oral Daily  . nicotine  21 mg Transdermal Daily  . pantoprazole  40 mg Oral BID  . pneumococcal 23 valent vaccine  0.5 mL Intramuscular Tomorrow-1000  . spironolactone  25 mg Oral Daily  . thiamine  100 mg Oral Daily   Continuous Infusions: . sodium chloride 250 mL (10/30/19 0431)  . lactated ringers 100 mL/hr at 10/31/19 1713     LOS: 9 days   Time spent: 35 minutes.Lorella Nimrod, MD [facility]  If 7PM-7AM, please contact night-coverage [email or web address]  11/01/2019, 9:47 PM   This record has been created using Systems analyst. Errors have been sought and corrected,but may not always be located. Such creation errors do not reflect on the standard of care.

## 2019-11-02 DIAGNOSIS — K922 Gastrointestinal hemorrhage, unspecified: Secondary | ICD-10-CM

## 2019-11-02 DIAGNOSIS — G600 Hereditary motor and sensory neuropathy: Secondary | ICD-10-CM

## 2019-11-02 DIAGNOSIS — M25561 Pain in right knee: Secondary | ICD-10-CM

## 2019-11-02 DIAGNOSIS — R1084 Generalized abdominal pain: Secondary | ICD-10-CM

## 2019-11-02 LAB — CULTURE, BLOOD (ROUTINE X 2)
Culture: NO GROWTH
Culture: NO GROWTH
Special Requests: ADEQUATE

## 2019-11-02 LAB — PROTIME-INR
INR: 1.5 — ABNORMAL HIGH (ref 0.8–1.2)
Prothrombin Time: 17.3 seconds — ABNORMAL HIGH (ref 11.4–15.2)

## 2019-11-02 MED ORDER — FUROSEMIDE 20 MG PO TABS
20.0000 mg | ORAL_TABLET | Freq: Every day | ORAL | 11 refills | Status: DC
Start: 2019-11-02 — End: 2019-12-04

## 2019-11-02 MED ORDER — MIDODRINE HCL 5 MG PO TABS: 5.0000 mg | ORAL_TABLET | Freq: Two times a day (BID) | ORAL | 1 refills | Status: DC

## 2019-11-02 MED ORDER — LACTULOSE 10 GM/15ML PO SOLN: 20.0000 g | Freq: Two times a day (BID) | ORAL | 0 refills | Status: DC

## 2019-11-02 MED ORDER — DICYCLOMINE HCL 20 MG PO TABS: 20.0000 mg | ORAL_TABLET | Freq: Four times a day (QID) | ORAL | 0 refills | Status: DC | PRN

## 2019-11-02 MED ORDER — THIAMINE HCL 100 MG PO TABS: 100.0000 mg | ORAL_TABLET | Freq: Every day | ORAL | 0 refills | Status: DC

## 2019-11-02 MED ORDER — NICOTINE 21 MG/24HR TD PT24: 21.0000 mg | MEDICATED_PATCH | Freq: Every day | TRANSDERMAL | 0 refills | Status: DC

## 2019-11-02 MED ORDER — PANTOPRAZOLE SODIUM 40 MG PO TBEC: 40.0000 mg | DELAYED_RELEASE_TABLET | Freq: Two times a day (BID) | ORAL | 2 refills | Status: DC

## 2019-11-02 MED ORDER — DULOXETINE HCL 20 MG PO CPEP: 20.0000 mg | ORAL_CAPSULE | Freq: Every day | ORAL | 3 refills | Status: DC

## 2019-11-02 MED ORDER — FOLIC ACID 1 MG PO TABS: 1.0000 mg | ORAL_TABLET | Freq: Every day | ORAL | 0 refills | Status: DC

## 2019-11-02 NOTE — Progress Notes (Signed)
Physical Therapy Treatment Patient Details Name: Rhonda Martin MRN: [id number] DOB: 04/28/1976 Today's Date: 11/02/2019    History of Present Illness Pt is a 44 y.o. female presenting to hospital with nausea, vomiting (bloody emesis).  Pt admitted with hematemesis with severe anemia in setting of liver cirrhosis and chronic Hep C.  S/p EGD 5/17.  Pt noted with neutropenic fever, unsupervised drug use, and hypotension during hospitalization.  PMH includes alcohol abuse, Hep C, liver cirrhosis, htn, COPD, h/o back injury, cervical CA, Charcot-Marie-Tooth disease.    PT Comments    Pt resting in bed upon PT arrival; smoke smell noted in room (therapist checked pt's room for source of smell but unable to find any source)--nurse notified and came to assess and tele-sitter already in place; pt reports that she was not smoking but her husband was recently here and may have smelled of smoke.  Donning of R LE knee immobilizer performed by pt in bed modified independently.  Pt was modified independent with bed mobility, transfers, and ambulation 60 feet with RW in room (pt reporting she felt more comfortable using RW today so deferred use of cane for ambulation).  Will continue to focus on strengthening and progressive functional mobility during hospitalization.        Follow Up Recommendations  Home health PT;Supervision for mobility/OOB     Equipment Recommendations  3in1 (PT)(pt has RW at home already)    Recommendations for Other Services       Precautions / Restrictions Precautions Precautions: Fall Precaution Comments: Protective precautions Required Braces or Orthoses: Other Brace Other Brace: Pt reports using R LE brace when walking Restrictions Weight Bearing Restrictions: No    Mobility  Bed Mobility Overal bed mobility: Modified Independent             General bed mobility comments: Semi-supine to/from sitting without any noted difficulties  Transfers Overall transfer  level: Modified independent Equipment used: Rolling walker (2 wheeled)             General transfer comment: sit to stand from bed x1 trial and from [facility] x1 trial with RW use; stand step turn BSC to bed with RW; steady with transfers  Ambulation/Gait Ambulation/Gait assistance: Min guard Gait Distance (Feet): 60 Feet Assistive device: Rolling walker (2 wheeled)   Gait velocity: decreased   General Gait Details: antalgic; decreased stance time R LE; steady with RW use   Stairs Stairs: (pt declined stairs trial (pt reporting plan to using railing and husband assist to navigate stairs))           Wheelchair Mobility    Modified Rankin (Stroke Patients Only)       Balance Overall balance assessment: Needs assistance Sitting-balance support: No upper extremity supported;Feet supported Sitting balance-Leahy Scale: Normal Sitting balance - Comments: steady sitting reaching outside BOS   Standing balance support: During functional activity Standing balance-Leahy Scale: Good Standing balance comment: steady ambulating with R KI and RW use                            Cognition Arousal/Alertness: Awake/alert Behavior During Therapy: WFL for tasks assessed/performed Overall Cognitive Status: Within Functional Limits for tasks assessed                                        Exercises      General  Comments   Nursing cleared pt for participation in physical therapy.  Pt agreeable to PT session.      Pertinent Vitals/Pain Pain Assessment: 0-10 Pain Score: 7  Pain Location: chronic R knee and back pain Pain Descriptors / Indicators: Aching;Sore Pain Intervention(s): Limited activity within patient's tolerance;Monitored during session;Repositioned  HR 95-112 bpm and O2 sats 95% or greater on room air during sessions activities.    Home Living                      Prior Function            PT Goals (current goals can now be  found in the care plan section) Acute Rehab PT Goals Patient Stated Goal: to go home PT Goal Formulation: With patient Time For Goal Achievement: 11/15/19 Potential to Achieve Goals: Good Progress towards PT goals: Progressing toward goals    Frequency    Min 2X/week      PT Plan Current plan remains appropriate    Co-evaluation              AM-PAC PT "6 Clicks" Mobility   Outcome Measure  Help needed turning from your back to your side while in a flat bed without using bedrails?: None Help needed moving from lying on your back to sitting on the side of a flat bed without using bedrails?: None Help needed moving to and from a bed to a chair (including a wheelchair)?: None Help needed standing up from a chair using your arms (e.g., wheelchair or bedside chair)?: None Help needed to walk in hospital room?: A Little Help needed climbing 3-5 steps with a railing? : A Little 6 Click Score: 22    End of Session Equipment Utilized During Treatment: Gait belt Activity Tolerance: Patient tolerated treatment well;No increased pain Patient left: in bed;with call bell/phone within reach;with bed alarm set Nurse Communication: Mobility status;Precautions(smell of smoke in pt's room) PT Visit Diagnosis: Other abnormalities of gait and mobility (R26.89);Muscle weakness (generalized) (M62.81);History of falling (Z91.81);Difficulty in walking, not elsewhere classified (R26.2)     Time: 1000-1018 PT Time Calculation (min) (ACUTE ONLY): 18 min  Charges:  $Therapeutic Activity: 8-22 mins                    Leitha Bleak, PT 11/02/19, 10:37 AM

## 2019-11-02 NOTE — TOC Transition Note (Signed)
Transition of Care [facility]) - CM/SW Discharge Note   Patient Details  Name: Rhonda Martin MRN: [id number] Date of Birth: Mar 05, 1976  Transition of [facility]) CM/SW Contact:  Shelbie Hutching, RN Phone Number: 11/02/2019, 9:53 AM   Clinical Narrative:     RNCM met with patient and husband at the bedside, patient is very excited to go home today.  Patient is agreeable to having home health services arranged.  Jana Half with [facility] has accepted the referral for home health PT, aide, and SW.  Patient reports that she has a walker, cane, and wheelchair at home, she does need a 3 in 1.  3 in 1 ordered and will be provided by [employer].  [facility] will bring 3 in 1 to the bedside before discharge.  Patient's husband will transport patient home.   Final next level of care: [place] Barriers to Discharge: Barriers Resolved   Patient Goals and CMS Choice Patient states their goals for this hospitalization and ongoing recovery are:: to return home and evict her husband's daughter CMS Medicare.gov Compare Post Acute Care list provided to:: Patient Choice offered to / list presented to : Patient  Discharge Placement                       Discharge Plan and Services                DME Arranged: 3-N-1 DME Agency: [facility] Date DME Agency Contacted: 11/02/19 Time DME Agency Contacted: [phone] Representative spoke with at DME Agency: Andree Coss HH Arranged: PT, Nurse's Aide, Social Work [facility] Agency: [place] Date [place]: 11/02/19 Time [place]: [phone] Representative spoke with at [place]: [place] Interventions     Readmission Risk Interventions Readmission Risk Prevention Plan 10/25/2019 01/21/2019  Post Dischage Appt - Complete  Medication Screening - Complete  Transportation Screening Complete Complete  PCP or Specialist Appt within 3-5 Days Complete -  HRI or Home Care Consult  Complete -  Medication Review (RN Care Manager) Complete -

## 2019-11-02 NOTE — Discharge Summary (Signed)
Physician Discharge Summary  Rhonda Martin [id number] DOB: 26-Mar-1976 DOA: 10/23/2019  PCP: Volney American, PA-C  Admit date: 10/23/2019 Discharge date: 11/02/2019  Admitted From: Home Disposition: Home   Recommendations for Outpatient Follow-up:  1. Follow up with PCP in 1-2 weeks 2. Follow-up with gastroenterology. 3. Follow up with psychiatry 4. Please obtain BMP/CBC in one week 5. Please follow up on the following pending results: None  Home Health:Yes Equipment/Devices: Rolling walker, 3 in 1 Discharge Condition: Fair CODE STATUS: Full Diet recommendation: Heart Healthy   Brief/Interim Summary: [name] is a 44 yo female with medical history significant for alcoholic cirrhosis, alcohol abuse,hepatitis C,, hypertension, COPD, GERD, anxiety, iron deficiency anemia, cervical cancer, tobacco abuse, thrombocytopeniaWho presented with several episodes of emesis of bright red blood over the past 2 days associated with epigastric pain. On EMS evaluation patient was found to have a systolic blood pressure initially in the 80s and reportedly came down to the 40s.  Initially treated with IV fluid, IV octreotide and IV Protonix along with 2 units of PRBC. EGD showed Dieulafoylesions/p hemostatic clip and esophagitis.  No variceal GI bleed.  No recurrent hematemesis.  Hemoglobin was stable on discharge.  It has a complicated course during her hospitalization.  She went through alcohol withdrawal requiring Precedex infusion.  She was stable on discharge and we had an extensive counseling regarding alcohol use.  Patient promises as that she will not drink again.  Patient had alcoholic liver cirrhosis with Child pugh class C, with a score of 11.  Elevated LFTs with hyperbilirubinemia most likely multifactorial with a combination of shock liver along with alcoholic hepatitis and hepatitis C.  There is also concern for reactivation of hepatitis B given hep B core IgM positive but  hep B surface antigen negative and hep B E antibody positive.  Hepatitis B and C viral DNA is negative. Right upper quadrant ultrasound with cholelithiasis.  Her functions were improving on discharge.  Did developed sudden onset fever and neutropenia.  Studies was positive for a possible colitis and she was treated with a 5-day course of Zosyn.  Responded well and her abdominal pain resolved on discharge.  He also developed worsening thrombocytopenia.  Platelets were improving on discharge.  She developed intermittent hypotension.  Initially we held her home dose of spironolactone but due to worsening ascites spironolactone along with low-dose Lasix was started.  She was also started on midodrine.  She remains normotensive most of the time with the help of midodrine on discharge.  Patient was complaining of bilateral knee pain, currently being managed by orthopedic as an outpatient.  She was asking for a referral to see a pain management clinic which was provided.  Patient shows some concern regarding some threatening environment at home.  At times become very fearful and tearful.  Psych was also consulted and they started her on Cymbalta.  Social worker consulted APS.  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 Holley Raring   Diet - low sodium heart healthy   Complete by: As directed    Discharge  instructions   Complete by: As directed    It was pleasure taking care of you. I am starting you on Lasix 20 mg daily along with spironolactone to help with your ascites.  I am also starting you on a low-dose midodrine which will help maintain your blood pressure as your blood pressure was becoming low. Please follow-up closely with your primary care physician and gastroenterologist. Your psychiatrist started you on Cymbalta, please follow-up with them, you can also do a video call with them if you cannot physically go for your appointment. Most important thing is that you do not drink at all as it is adversely affecting your liver function.   Increase activity slowly   Complete by: As directed      Allergies as of 11/02/2019   No Known Allergies     Medication List    STOP taking these medications   metoprolol succinate 25 MG 24 hr tablet Commonly known as: TOPROL-XL   potassium chloride SA 20 MEQ tablet Commonly known as: Klor-Con M20     TAKE these medications   Blood Pressure Monitor Misc For automatic blood pressure cuff.   dicyclomine 20 MG tablet Commonly known as: BENTYL Take 1 tablet (20 mg total) by mouth every 6 (six) hours as needed for spasms (belly cramping).   DULoxetine 20 MG capsule Commonly known as: CYMBALTA Take 1 capsule (20 mg total) by mouth daily.   ferrous sulfate 325 (65 FE) MG EC tablet Take 1 tablet (325 mg total) by mouth 2 (two) times daily.   folic acid 1 MG tablet Commonly known as: FOLVITE Take 1 tablet (1 mg total) by mouth daily.   furosemide 20 MG tablet Commonly known as: Lasix Take 1 tablet (20 mg total) by mouth daily.   gabapentin 300 MG capsule Commonly known as: NEURONTIN TAKE 1-3 CAPSULES BY MOUTH DAILY AS NEEDED What changed: See the new instructions.   hydrOXYzine 25 MG tablet Commonly known as: ATARAX/VISTARIL TAKE 1 TABLET BY MOUTH THREE TIMES A DAY AS NEEDED What changed: reasons to take this   lactulose 10  GM/15ML solution Commonly known as: CHRONULAC Take 30 mLs (20 g total) by mouth 2 (two) times daily.   midodrine 5 MG tablet Commonly known as: PROAMATINE Take 1 tablet (5 mg total) by mouth 2 (two) times daily with a meal.   multivitamin with minerals Tabs tablet Take 1 tablet by mouth daily.   nicotine 21 mg/24hr patch Commonly known as: NICODERM CQ - dosed in mg/24 hours Place 1 patch (21 mg total) onto the skin daily.   oxyCODONE 5 MG immediate release tablet Commonly known as: Oxy IR/ROXICODONE Take 5 mg by mouth 5 (five) times daily as needed for moderate pain or severe pain.   pantoprazole 40 MG tablet Commonly known as: PROTONIX Take 1 tablet (40 mg total) by mouth 2 (two) times daily. What changed: when to take this   spironolactone 25 MG tablet Commonly known as: Aldactone Take 1 tablet (25 mg total) by mouth daily.   sucralfate 1 g tablet Commonly known as: Carafate Take 1 tablet (1 g total) by mouth 4 (four) times daily -  with meals and at bedtime.   thiamine 100 MG tablet Take 1 tablet (100 mg total) by mouth daily.   triamcinolone cream 0.1 %  Commonly known as: KENALOG Apply 1 application topically 2 (two) times daily. What changed:   when to take this  reasons to take this   VITAMIN B-12 PO Take 1 tablet by mouth daily.   VITAMIN E PO Take 1 capsule by mouth daily.   zolpidem 5 MG tablet Commonly known as: AMBIEN TAKE 1 TABLET (5 MG TOTAL) BY MOUTH AT BEDTIME AS NEEDED FOR SLEEP.      Follow-up Information    Volney American, PA-C Follow up.   Specialty: Family Medicine Why: Please schedule PCP appointment within 5-7 days of discharge date. Contact information: [place] [address] [phone]        Nelva Bush, MD .   Specialty: Cardiology Contact information: [place] [address] [phone]        Gillis Santa, MD. Schedule an appointment as soon as possible for a  visit.   Specialty: Pain Medicine Contact information: [place] [address] [phone]          No Known Allergies  Consultations:  GI  Psychiatry  Palliative care  Procedures/Studies: CT ABDOMEN PELVIS WO CONTRAST  Result Date: 10/28/2019 CLINICAL DATA:  44 year old female with a history of fever and abdominal pain EXAM: CT ABDOMEN AND PELVIS WITHOUT CONTRAST TECHNIQUE: Multidetector CT imaging of the abdomen and pelvis was performed following the standard protocol without IV contrast. COMPARISON:  10/25/2019, 07/10/2019 FINDINGS: Lower chest: Since the prior CT there has been resolution of the peripheral ground-glass opacities in the bases of the lungs. Scarring/atelectasis at the left lung base. Trace pleural fluid. Hepatobiliary: Nodular contour of liver parenchyma with enlargement of the caudate lobe. Hyperdense material within the dependent gallbladder. Gallbladder wall without thickening or local inflammatory changes. Pancreas: Unremarkable, though incompletely characterized given presence of local inflammation in the absence of IV contrast. Spleen: Greatest diameter of the spleen measures at least 14.8 cm. Adrenals/Urinary Tract: - Right adrenal gland:  Unremarkable - Left adrenal gland: Unremarkable. - Right kidney: No hydronephrosis, nephrolithiasis, inflammation, or ureteral dilation. No focal lesion. - Left Kidney: No hydronephrosis, nephrolithiasis, inflammation, or ureteral dilation. No focal lesion. - Urinary Bladder: Partially distended. Stomach/Bowel: - Stomach: Unremarkable. - Small bowel: Enteric contrast has traversed only the proximal small bowel. Distal small bowel remains unopacified though relatively decompressed. - Appendix: Normal. - Colon: Circumferential wall thickening of the right colon and cecum. Mild stool burden. Vascular/Lymphatic: Mild atherosclerosis of the abdominal aorta. Although vascular structures unopacified, there is  redemonstration of umbilical vein recanalization secondary to portal hypertension as well as portal-portal collaterals within the upper abdomen. Reproductive: IUD within the uterus.  Otherwise unremarkable adnexa. Other: Small-moderate volume ascites.  Mesenteric edema. Small lymph nodes in the bilateral inguinal region, unchanged from prior. Musculoskeletal: Redemonstration of compression fractures of T10, L1 without retropulsion of posterior endplate. Schmorl's node of L3 again demonstrated. Focal kyphotic deformity centered at the L1 level is again noted. Degenerative changes of the hips. No bony canal narrowing. IMPRESSION: The right colon is circumferentially thickened indicating colitis. Etiology uncertain, though portal colopathy is favored given the stigmata of cirrhosis and portal hypertension. Alternative differential diagnosis includes infectious and inflammatory. Redemonstration of cirrhosis, portal hypertension with portal collaterals of the upper abdomen, splenomegaly, and small-moderate volume ascites with mesenteric edema. Cholelithiasis without evidence of acute cholecystitis. Additional ancillary findings as above. Electronically Signed   By: Corrie Mckusick D.O.   On: 10/28/2019 10:45   DG Chest 2 View  Result Date: 10/28/2019 CLINICAL DATA:  Cough EXAM: CHEST - 2 VIEW COMPARISON:  10/23/2019 FINDINGS: Low volume chest with band of indistinct opacity at the left base. Normal heart size and mediastinal contours. No Kerley lines. No pneumothorax IMPRESSION: Atelectasis or bronchopneumonia at the left base. Electronically Signed   By: Monte Fantasia M.D.   On: 10/28/2019 11:30   DG Chest Port 1 View  Result Date: 10/23/2019 CLINICAL DATA:  Bloody emesis EXAM: PORTABLE CHEST 1 VIEW COMPARISON:  September 03, 2019 FINDINGS: The heart size and mediastinal contours are within normal limits. Both lungs are clear. The visualized skeletal structures are unremarkable. IMPRESSION: No active disease.  Electronically Signed   By: Prudencio Pair M.D.   On: 10/23/2019 03:12   US Abdomen Limited RUQ  Result Date: 10/25/2019 CLINICAL DATA:  Elevated liver enzymes.  History of hepatitis-C EXAM: ULTRASOUND ABDOMEN LIMITED RIGHT UPPER QUADRANT COMPARISON:  February 06, 2019 FINDINGS: Gallbladder: Within the gallbladder, there are echogenic foci which move and shadow consistent with cholelithiasis. Largest gallstone measures 8 mm in length. No gallbladder wall thickening or pericholecystic fluid. No sonographic Murphy sign noted by sonographer. Common bile duct: Diameter: 5 mm. No intrahepatic or extrahepatic biliary duct dilatation. Liver: No focal lesion identified. Liver echogenicity is mildly coarsened and overall increased diffusely. Portal vein is patent on color Doppler imaging with normal direction of blood flow towards the liver. Other: None. IMPRESSION: 1. Cholelithiasis. No gallbladder wall thickening or pericholecystic fluid. 2. Liver echogenicity is somewhat coarsened and overall increased diffusely, an appearance indicative of hepatic steatosis with potential underlying parenchymal liver disease. While no focal liver lesions are evident on this study, it must be cautioned that the sensitivity of ultrasound for detection of focal liver lesions is diminished significantly in this circumstance. Electronically Signed   By: Lowella Grip III M.D.   On: 10/25/2019 10:12    Subjective: Patient was feeling much better when seen today.  Denies any pain.  Discharge Exam: Vitals:   11/02/19 0418 11/02/19 0834  BP: 116/70 132/69  Pulse: 92 95  Resp: 15 10  Temp: 99 F (37.2 C) 98.9 F (37.2 C)  SpO2: 95% 96%   Vitals:   11/02/19 0100 11/02/19 0300 11/02/19 0418 11/02/19 0834  BP: 133/62 (!) 158/84 116/70 132/69  Pulse: 86 91 92 95  Resp: (!) 21 18 15 10   Temp:  98.8 F (37.1 C) 99 F (37.2 C) 98.9 F (37.2 C)  TempSrc:  Oral Oral Oral  SpO2: 97% 97% 95% 96%  Weight:      Height:         General: Pt is alert, awake, not in acute distress Cardiovascular: RRR, S1/S2 +, no rubs, no gallops Respiratory: CTA bilaterally, no wheezing, no rhonchi Abdominal: Soft, NT, ND, bowel sounds + Extremities: no edema, no cyanosis   The results of significant diagnostics from this hospitalization (including imaging, microbiology, ancillary and laboratory) are listed below for reference.    Microbiology: Recent Results (from the past 240 hour(s))  CULTURE, BLOOD (ROUTINE X 2) w Reflex to ID Panel     Status: None   Collection Time: 10/28/19  8:02 AM   Specimen: BLOOD  Result Value Ref Range Status   Specimen Description BLOOD RW  Final   Special Requests   Final    BOTTLES DRAWN AEROBIC AND ANAEROBIC Blood Culture adequate volume   Culture   Final    NO GROWTH 5 DAYS Performed at [facility], [address],  [place], [place] [address]    Report Status 11/02/2019 FINAL  Final  CULTURE, BLOOD (ROUTINE X 2) w Reflex to ID Panel     Status: None   Collection Time: 10/28/19  8:02 AM   Specimen: BLOOD  Result Value Ref Range Status   Specimen Description BLOOD LW  Final   Special Requests   Final    BOTTLES DRAWN AEROBIC ONLY Blood Culture results may not be optimal due to an inadequate volume of blood received in culture bottles   Culture   Final    NO GROWTH 5 DAYS Performed at [facility], [address] [place], [place] [address]    Report Status 11/02/2019 FINAL  Final     Labs: BNP (last 3 results) No results for input(s): BNP in the last 8760 hours. Basic Metabolic Panel: Recent Labs  Lab 10/27/19 0555 10/28/19 0802 10/29/19 0606 10/30/19 0645 10/31/19 0531  NA 136 134* 133* 134* 137  K 3.1* 3.3* 4.0 3.5 3.6  CL 107 105 107 107 109  CO2 24 21* 18* 21* 23  GLUCOSE 125* 116* 100* 90 103*  BUN 8 6 6 7 6   CREATININE <0.30* 0.41* 0.34* 0.43* 0.40*  CALCIUM 7.0* 7.5* 6.9* 7.2* 7.4*  MG 1.6*  --   --   --   --    Liver Function  Tests: Recent Labs  Lab 10/27/19 0555 10/29/19 0606 10/30/19 0645 10/31/19 0531  AST 134* 76* 47* 43*  ALT 94* 62* 50* 41  ALKPHOS 95 91 80 79  BILITOT 2.8* 2.3* 2.7* 1.9*  PROT 5.1* 4.7* 5.0* 4.8*  ALBUMIN 2.2* 2.0* 2.1* 2.0*   No results for input(s): LIPASE, AMYLASE in the last 168 hours. No results for input(s): AMMONIA in the last 168 hours. CBC: Recent Labs  Lab 10/28/19 0803 10/29/19 0606 10/30/19 0645 10/31/19 0531 11/01/19 0417  WBC 1.0* 5.0 2.4* 4.7 5.5  NEUTROABS 0.9* 3.0 2.0 2.7 3.4  HGB 9.4* 7.8* 8.7* 8.4* 9.0*  HCT 29.0* 24.2* 25.9* 25.7* 27.5*  MCV 90.6 91.0 87.8 88.9 87.9  PLT 52* 59* 70* 96* 125*   Cardiac Enzymes: No results for input(s): CKTOTAL, CKMB, CKMBINDEX, TROPONINI in the last 168 hours. BNP: Invalid input(s): POCBNP CBG: No results for input(s): GLUCAP in the last 168 hours. D-Dimer No results for input(s): DDIMER in the last 72 hours. Hgb A1c No results for input(s): HGBA1C in the last 72 hours. Lipid Profile No results for input(s): CHOL, HDL, LDLCALC, TRIG, CHOLHDL, LDLDIRECT in the last 72 hours. Thyroid function studies No results for input(s): TSH, T4TOTAL, T3FREE, THYROIDAB in the last 72 hours.  Invalid input(s): FREET3 Anemia work up No results for input(s): VITAMINB12, FOLATE, FERRITIN, TIBC, IRON, RETICCTPCT in the last 72 hours. Urinalysis    Component Value Date/Time   COLORURINE YELLOW (A) 10/28/2019 1208   APPEARANCEUR CLEAR (A) 10/28/2019 1208   APPEARANCEUR Clear 10/09/2019 1448   LABSPEC 1.009 10/28/2019 1208   PHURINE 6.0 10/28/2019 1208   GLUCOSEU NEGATIVE 10/28/2019 1208   HGBUR NEGATIVE 10/28/2019 [place] 10/28/2019 1208   BILIRUBINUR Negative 10/09/2019 1448   KETONESUR NEGATIVE 10/28/2019 1208   PROTEINUR NEGATIVE 10/28/2019 1208   NITRITE NEGATIVE 10/28/2019 1208   LEUKOCYTESUR TRACE (A) 10/28/2019 1208   Sepsis Labs Invalid input(s): PROCALCITONIN,  WBC,   LACTICIDVEN Microbiology Recent Results (from the past 240 hour(s))  CULTURE, BLOOD (ROUTINE X 2) w Reflex to ID Panel     Status: None   Collection  Time: 10/28/19  8:02 AM   Specimen: BLOOD  Result Value Ref Range Status   Specimen Description BLOOD RW  Final   Special Requests   Final    BOTTLES DRAWN AEROBIC AND ANAEROBIC Blood Culture adequate volume   Culture   Final    NO GROWTH 5 DAYS Performed at [facility], [address] [place], [place] [address]    Report Status 11/02/2019 FINAL  Final  CULTURE, BLOOD (ROUTINE X 2) w Reflex to ID Panel     Status: None   Collection Time: 10/28/19  8:02 AM   Specimen: BLOOD  Result Value Ref Range Status   Specimen Description BLOOD LW  Final   Special Requests   Final    BOTTLES DRAWN AEROBIC ONLY Blood Culture results may not be optimal due to an inadequate volume of blood received in culture bottles   Culture   Final    NO GROWTH 5 DAYS Performed at [facility], [address], [place], [place] [address]    Report Status 11/02/2019 FINAL  Final    Time coordinating discharge: Over 30 minutes  SIGNED:  Lorella Nimrod, MD  [facility] 11/02/2019, 7:35 PM  If 7PM-7AM, please contact night-coverage [email or web address]  This record has been created using Systems analyst. Errors have been sought and corrected,but may not always be located. Such creation errors do not reflect on the standard of care.

## 2019-11-03 ENCOUNTER — Encounter: Payer: Self-pay | Admitting: Family Medicine

## 2019-11-03 DIAGNOSIS — Z79899 Other long term (current) drug therapy: Secondary | ICD-10-CM | POA: Diagnosis not present

## 2019-11-05 ENCOUNTER — Encounter: Payer: Self-pay | Admitting: Family Medicine

## 2019-11-10 ENCOUNTER — Ambulatory Visit: Payer: Medicaid Other | Admitting: Family Medicine

## 2019-11-10 ENCOUNTER — Other Ambulatory Visit: Payer: Self-pay

## 2019-11-10 ENCOUNTER — Encounter: Payer: Self-pay | Admitting: Family Medicine

## 2019-11-10 VITALS — BP 126/74 | HR 88 | Temp 100.0°F | Wt 123.0 lb

## 2019-11-10 DIAGNOSIS — D696 Thrombocytopenia, unspecified: Secondary | ICD-10-CM | POA: Diagnosis not present

## 2019-11-10 DIAGNOSIS — Z8742 Personal history of other diseases of the female genital tract: Secondary | ICD-10-CM

## 2019-11-10 DIAGNOSIS — F5101 Primary insomnia: Secondary | ICD-10-CM

## 2019-11-10 DIAGNOSIS — K7460 Unspecified cirrhosis of liver: Secondary | ICD-10-CM | POA: Diagnosis not present

## 2019-11-10 DIAGNOSIS — K920 Hematemesis: Secondary | ICD-10-CM

## 2019-11-10 DIAGNOSIS — Z30431 Encounter for routine checking of intrauterine contraceptive device: Secondary | ICD-10-CM

## 2019-11-10 DIAGNOSIS — F1010 Alcohol abuse, uncomplicated: Secondary | ICD-10-CM

## 2019-11-10 MED ORDER — GABAPENTIN 300 MG PO CAPS: 300.0000 mg | ORAL_CAPSULE | Freq: Three times a day (TID) | ORAL | 2 refills | Status: DC | PRN

## 2019-11-10 MED ORDER — ZOLPIDEM TARTRATE 10 MG PO TABS
10.0000 mg | ORAL_TABLET | Freq: Every evening | ORAL | 1 refills | Status: DC | PRN
Start: 2019-11-10 — End: 2019-12-04

## 2019-11-10 NOTE — Assessment & Plan Note (Signed)
Recheck labs s/p discharge, no overt bleeding issues since d/c.

## 2019-11-10 NOTE — Assessment & Plan Note (Signed)
Will trial increased ambien dose and continue to monitor. Has Psychiatrist who can be following up on that at next appt

## 2019-11-10 NOTE — Assessment & Plan Note (Signed)
Following with GI, recheck labs, continue to encourage alcohol cessation

## 2019-11-10 NOTE — Progress Notes (Signed)
BP 126/74   Pulse 88   Temp 100 F (37.8 C) (Oral)   Wt 123 lb (55.8 kg)   SpO2 97%   BMI 22.50 kg/m    Subjective:    [name], female    DOB: 02-09-1976, 44 y.o.   MRN: [id number]  HPI: Rhonda Martin is a 44 y.o. female  Chief Complaint  Patient presents with  . Hypertension  . Hospitalization Follow-up  . Referral    to OBGYN   Here today for follow up recent hospitalization for hematemesis, bowel infection, dehydration and multiple other issues. Was unable to have scheduled knee surgery due to admission. Doing much better after IV abx, rehydration, and numerous blood transfusions. Has underlying hep C and severe liver cirrhosis with chronic anemia with upper GI hemorrhage then additionally. Feeling overall better since discharge, still weak and in pain though.   Not sleeping well at all, ambien helping but wanting to increase dose for better effect. Tolerates well without side effects and has been on higher dose in the past.   Hx of abnormal pap smear s/p bx (unclear results from this or dates), has not had one in "several years". Also overdue for mirena replacement as well - per patient IUD was placed 11/2010.   Relevant past medical, surgical, family and social history reviewed and updated as indicated. Interim medical history since our last visit reviewed. Allergies and medications reviewed and updated.  Review of Systems  Per HPI unless specifically indicated above     Objective:    BP 126/74   Pulse 88   Temp 100 F (37.8 C) (Oral)   Wt 123 lb (55.8 kg)   SpO2 97%   BMI 22.50 kg/m   Wt Readings from Last 3 Encounters:  11/10/19 123 lb (55.8 kg)  10/23/19 124 lb (56.2 kg)  10/19/19 125 lb (56.7 kg)    Physical Exam Vitals and nursing note reviewed.  Constitutional:      Comments: Appears chronically ill  HENT:     Head: Atraumatic.  Eyes:     Extraocular Movements: Extraocular movements intact.     Conjunctiva/sclera: Conjunctivae  normal.  Cardiovascular:     Rate and Rhythm: Normal rate and regular rhythm.     Heart sounds: Normal heart sounds.  Pulmonary:     Effort: Pulmonary effort is normal.     Breath sounds: Normal breath sounds.  Abdominal:     General: Bowel sounds are normal. There is no distension.     Palpations: Abdomen is soft.     Tenderness: There is no abdominal tenderness.  Musculoskeletal:     Cervical back: Normal range of motion and neck supple.     Comments: ROM at baseline  Skin:    General: Skin is warm and dry.  Neurological:     Mental Status: She is alert and oriented to person, place, and time.  Psychiatric:        Mood and Affect: Mood normal.        Thought Content: Thought content normal.        Judgment: Judgment normal.     Results for orders placed or performed during the hospital encounter of 10/23/19  SARS Coronavirus 2 by RT PCR (hospital order, performed in [facility] hospital lab) Nasopharyngeal Nasopharyngeal Swab   Specimen: Nasopharyngeal Swab  Result Value Ref Range   SARS Coronavirus 2 NEGATIVE NEGATIVE  MRSA PCR Screening   Specimen: Nasal Mucosa; Nasopharyngeal  Result  Value Ref Range   MRSA by PCR NEGATIVE NEGATIVE  CULTURE, BLOOD (ROUTINE X 2) w Reflex to ID Panel   Specimen: BLOOD  Result Value Ref Range   Specimen Description BLOOD RW    Special Requests      BOTTLES DRAWN AEROBIC AND ANAEROBIC Blood Culture adequate volume   Culture      NO GROWTH 5 DAYS Performed at [facility], [address] [place], [place] [address]    Report Status 11/02/2019 FINAL   CULTURE, BLOOD (ROUTINE X 2) w Reflex to ID Panel   Specimen: BLOOD  Result Value Ref Range   Specimen Description BLOOD LW    Special Requests      BOTTLES DRAWN AEROBIC ONLY Blood Culture results may not be optimal due to an inadequate volume of blood received in culture bottles   Culture      NO GROWTH 5 DAYS Performed at [facility], [address]  [place], [place] [address]    Report Status 11/02/2019 FINAL   Comprehensive metabolic panel  Result Value Ref Range   Sodium 133 (L) 135 - 145 mmol/L   Potassium 3.3 (L) 3.5 - 5.1 mmol/L   Chloride 77 (L) 98 - 111 mmol/L   CO2 23 22 - 32 mmol/L   Glucose, Bld 127 (H) 70 - 99 mg/dL   BUN 47 (H) 6 - 20 mg/dL   Creatinine, Ser 1.61 (H) 0.44 - 1.00 mg/dL   Calcium 8.9 8.9 - 10.3 mg/dL   Total Protein 6.0 (L) 6.5 - 8.1 g/dL   Albumin 2.8 (L) 3.5 - 5.0 g/dL   AST 838 (H) 15 - 41 U/L   ALT 210 (H) 0 - 44 U/L   Alkaline Phosphatase 73 38 - 126 U/L   Total Bilirubin 4.2 (H) 0.3 - 1.2 mg/dL   GFR calc non Af Amer 39 (L) >60 mL/min   GFR calc Af Amer 45 (L) >60 mL/min   Anion gap 33 (H) 5 - 15  CBC WITH DIFFERENTIAL  Result Value Ref Range   WBC 16.3 (H) 4.0 - 10.5 K/uL   RBC 2.08 (L) 3.87 - 5.11 MIL/uL   Hemoglobin 6.1 (L) 12.0 - 15.0 g/dL   HCT 17.9 (L) 36.0 - 46.0 %   MCV 86.1 80.0 - 100.0 fL   MCH 29.3 26.0 - 34.0 pg   MCHC 34.1 30.0 - 36.0 g/dL   RDW 19.0 (H) 11.5 - 15.5 %   Platelets 117 (L) 150 - 400 K/uL   nRBC 0.0 0.0 - 0.2 %   Neutrophils Relative % 79 %   Neutro Abs 13.1 (H) 1.7 - 7.7 K/uL   Lymphocytes Relative 11 %   Lymphs Abs 1.7 0.7 - 4.0 K/uL   Monocytes Relative 9 %   Monocytes Absolute 1.4 (H) 0.1 - 1.0 K/uL   Eosinophils Relative 0 %   Eosinophils Absolute 0.0 0.0 - 0.5 K/uL   Basophils Relative 0 %   Basophils Absolute 0.0 0.0 - 0.1 K/uL   Immature Granulocytes 1 %   Abs Immature Granulocytes 0.10 (H) 0.00 - 0.07 K/uL  Protime-INR  Result Value Ref Range   Prothrombin Time 20.3 (H) 11.4 - 15.2 seconds   INR 1.8 (H) 0.8 - 1.2  Ammonia  Result Value Ref Range   Ammonia 19 9 - 35 umol/L  Lactic acid, plasma  Result Value Ref Range   Lactic Acid, Venous >11.0 (HH) 0.5 - 1.9 mmol/L  Lipase, blood  Result  Value Ref Range   Lipase 34 11 - 51 U/L  Ethanol  Result Value Ref Range   Alcohol, Ethyl (B) 29 (H) <10 mg/dL  Lactic acid, plasma  Result Value Ref  Range   Lactic Acid, Venous >11.0 (HH) 0.5 - 1.9 mmol/L  CBC  Result Value Ref Range   WBC 8.3 4.0 - 10.5 K/uL   RBC 2.13 (L) 3.87 - 5.11 MIL/uL   Hemoglobin 6.4 (L) 12.0 - 15.0 g/dL   HCT 17.9 (L) 36.0 - 46.0 %   MCV 84.0 80.0 - 100.0 fL   MCH 30.0 26.0 - 34.0 pg   MCHC 35.8 30.0 - 36.0 g/dL   RDW 16.4 (H) 11.5 - 15.5 %   Platelets 45 (L) 150 - 400 K/uL   nRBC 0.0 0.0 - 0.2 %  Lactic acid, plasma  Result Value Ref Range   Lactic Acid, Venous 1.4 0.5 - 1.9 mmol/L  Lactic acid, plasma  Result Value Ref Range   Lactic Acid, Venous 1.3 0.5 - 1.9 mmol/L  Comprehensive metabolic panel  Result Value Ref Range   Sodium 137 135 - 145 mmol/L   Potassium 3.5 3.5 - 5.1 mmol/L   Chloride 98 98 - 111 mmol/L   CO2 30 22 - 32 mmol/L   Glucose, Bld 161 (H) 70 - 99 mg/dL   BUN 48 (H) 6 - 20 mg/dL   Creatinine, Ser 0.88 0.44 - 1.00 mg/dL   Calcium 6.6 (L) 8.9 - 10.3 mg/dL   Total Protein 5.4 (L) 6.5 - 8.1 g/dL   Albumin 2.6 (L) 3.5 - 5.0 g/dL   AST 723 (H) 15 - 41 U/L   ALT 182 (H) 0 - 44 U/L   Alkaline Phosphatase 58 38 - 126 U/L   Total Bilirubin 3.8 (H) 0.3 - 1.2 mg/dL   GFR calc non Af Amer >60 >60 mL/min   GFR calc Af Amer >60 >60 mL/min   Anion gap 9 5 - 15  CBC  Result Value Ref Range   WBC 3.7 (L) 4.0 - 10.5 K/uL   RBC 2.69 (L) 3.87 - 5.11 MIL/uL   Hemoglobin 8.0 (L) 12.0 - 15.0 g/dL   HCT 23.6 (L) 36.0 - 46.0 %   MCV 87.7 80.0 - 100.0 fL   MCH 29.7 26.0 - 34.0 pg   MCHC 33.9 30.0 - 36.0 g/dL   RDW 16.3 (H) 11.5 - 15.5 %   Platelets 43 (L) 150 - 400 K/uL   nRBC 0.0 0.0 - 0.2 %  Hepatitis panel, acute  Result Value Ref Range   Hepatitis B Surface Ag NON REACTIVE NON REACTIVE   HCV Ab Reactive (A) NON REACTIVE   Hep A IgM NON REACTIVE NON REACTIVE   Hep B C IgM Reactive (A) NON REACTIVE  Phosphorus  Result Value Ref Range   Phosphorus 4.7 (H) 2.5 - 4.6 mg/dL  Magnesium  Result Value Ref Range   Magnesium 1.9 1.7 - 2.4 mg/dL  CBC  Result Value Ref Range   WBC 3.3  (L) 4.0 - 10.5 K/uL   RBC 2.39 (L) 3.87 - 5.11 MIL/uL   Hemoglobin 7.0 (L) 12.0 - 15.0 g/dL   HCT 21.1 (L) 36.0 - 46.0 %   MCV 88.3 80.0 - 100.0 fL   MCH 29.3 26.0 - 34.0 pg   MCHC 33.2 30.0 - 36.0 g/dL   RDW 17.1 (H) 11.5 - 15.5 %   Platelets 39 (L) 150 - 400 K/uL   nRBC 0.0 0.0 -  0.2 %  Comprehensive metabolic panel  Result Value Ref Range   Sodium 138 135 - 145 mmol/L   Potassium 3.4 (L) 3.5 - 5.1 mmol/L   Chloride 106 98 - 111 mmol/L   CO2 26 22 - 32 mmol/L   Glucose, Bld 123 (H) 70 - 99 mg/dL   BUN 21 (H) 6 - 20 mg/dL   Creatinine, Ser 0.51 0.44 - 1.00 mg/dL   Calcium 6.6 (L) 8.9 - 10.3 mg/dL   Total Protein 5.2 (L) 6.5 - 8.1 g/dL   Albumin 2.5 (L) 3.5 - 5.0 g/dL   AST 361 (H) 15 - 41 U/L   ALT 150 (H) 0 - 44 U/L   Alkaline Phosphatase 57 38 - 126 U/L   Total Bilirubin 3.1 (H) 0.3 - 1.2 mg/dL   GFR calc non Af Amer >60 >60 mL/min   GFR calc Af Amer >60 >60 mL/min   Anion gap 6 5 - 15  HCV RNA BY PCR, QN RFX GENO  Result Value Ref Range   HepC Qn HCV Not Detected IU/mL   HCV RNA Qnt(log copy/mL) UNABLE TO CALCULATE log10 IU/mL   Test Information Comment    Hcv Genotype RTNI   Protime-INR  Result Value Ref Range   Prothrombin Time 20.2 (H) 11.4 - 15.2 seconds   INR 1.8 (H) 0.8 - 1.2  Protime-INR  Result Value Ref Range   Prothrombin Time 19.0 (H) 11.4 - 15.2 seconds   INR 1.7 (H) 0.8 - 1.2  Hepatitis B surface antibody  Result Value Ref Range   Hepatitis B-Post 38.2 Immunity>9.9 mIU/mL  Hepatitis B DNA, Ultraquantitative, PCR  Result Value Ref Range   HBV DNA SERPL PCR-ACNC HBV DNA not detected IU/mL   HBV DNA SERPL PCR-LOG IU UNABLE TO CALCULATE log10 IU/mL   Test Info: Comment   Hepatitis B E antigen  Result Value Ref Range   Hep B E Ag Negative Negative  CBC  Result Value Ref Range   WBC 3.9 (L) 4.0 - 10.5 K/uL   RBC 3.02 (L) 3.87 - 5.11 MIL/uL   Hemoglobin 9.3 (L) 12.0 - 15.0 g/dL   HCT 25.9 (L) 36.0 - 46.0 %   MCV 85.8 80.0 - 100.0 fL   MCH 30.8  26.0 - 34.0 pg   MCHC 35.9 30.0 - 36.0 g/dL   RDW 16.8 (H) 11.5 - 15.5 %   Platelets 43 (L) 150 - 400 K/uL   nRBC 0.0 0.0 - 0.2 %  Magnesium  Result Value Ref Range   Magnesium 1.8 1.7 - 2.4 mg/dL  Comprehensive metabolic panel  Result Value Ref Range   Sodium 134 (L) 135 - 145 mmol/L   Potassium 3.5 3.5 - 5.1 mmol/L   Chloride 108 98 - 111 mmol/L   CO2 22 22 - 32 mmol/L   Glucose, Bld 121 (H) 70 - 99 mg/dL   BUN 13 6 - 20 mg/dL   Creatinine, Ser 0.55 0.44 - 1.00 mg/dL   Calcium 6.6 (L) 8.9 - 10.3 mg/dL   Total Protein 5.1 (L) 6.5 - 8.1 g/dL   Albumin 2.3 (L) 3.5 - 5.0 g/dL   AST 217 (H) 15 - 41 U/L   ALT 125 (H) 0 - 44 U/L   Alkaline Phosphatase 69 38 - 126 U/L   Total Bilirubin 2.4 (H) 0.3 - 1.2 mg/dL   GFR calc non Af Amer >60 >60 mL/min   GFR calc Af Amer >60 >60 mL/min   Anion gap 4 (  L) 5 - 15  Protime-INR  Result Value Ref Range   Prothrombin Time 18.6 (H) 11.4 - 15.2 seconds   INR 1.6 (H) 0.8 - 1.2  CBC  Result Value Ref Range   WBC 3.8 (L) 4.0 - 10.5 K/uL   RBC 3.23 (L) 3.87 - 5.11 MIL/uL   Hemoglobin 9.6 (L) 12.0 - 15.0 g/dL   HCT 28.5 (L) 36.0 - 46.0 %   MCV 88.2 80.0 - 100.0 fL   MCH 29.7 26.0 - 34.0 pg   MCHC 33.7 30.0 - 36.0 g/dL   RDW 17.9 (H) 11.5 - 15.5 %   Platelets 52 (L) 150 - 400 K/uL   nRBC 0.0 0.0 - 0.2 %  Glucose, capillary  Result Value Ref Range   Glucose-Capillary 124 (H) 70 - 99 mg/dL  Protime-INR  Result Value Ref Range   Prothrombin Time 19.3 (H) 11.4 - 15.2 seconds   INR 1.7 (H) 0.8 - 1.2  CBC  Result Value Ref Range   WBC 5.0 4.0 - 10.5 K/uL   RBC 2.94 (L) 3.87 - 5.11 MIL/uL   Hemoglobin 8.8 (L) 12.0 - 15.0 g/dL   HCT 26.3 (L) 36.0 - 46.0 %   MCV 89.5 80.0 - 100.0 fL   MCH 29.9 26.0 - 34.0 pg   MCHC 33.5 30.0 - 36.0 g/dL   RDW 17.3 (H) 11.5 - 15.5 %   Platelets 55 (L) 150 - 400 K/uL   nRBC 0.0 0.0 - 0.2 %  Comprehensive metabolic panel  Result Value Ref Range   Sodium 136 135 - 145 mmol/L   Potassium 3.1 (L) 3.5 - 5.1  mmol/L   Chloride 107 98 - 111 mmol/L   CO2 24 22 - 32 mmol/L   Glucose, Bld 125 (H) 70 - 99 mg/dL   BUN 8 6 - 20 mg/dL   Creatinine, Ser <0.30 (L) 0.44 - 1.00 mg/dL   Calcium 7.0 (L) 8.9 - 10.3 mg/dL   Total Protein 5.1 (L) 6.5 - 8.1 g/dL   Albumin 2.2 (L) 3.5 - 5.0 g/dL   AST 134 (H) 15 - 41 U/L   ALT 94 (H) 0 - 44 U/L   Alkaline Phosphatase 95 38 - 126 U/L   Total Bilirubin 2.8 (H) 0.3 - 1.2 mg/dL   GFR calc non Af Amer NOT CALCULATED >60 mL/min   GFR calc Af Amer NOT CALCULATED >60 mL/min   Anion gap 5 5 - 15  Magnesium  Result Value Ref Range   Magnesium 1.6 (L) 1.7 - 2.4 mg/dL  Protime-INR  Result Value Ref Range   Prothrombin Time 18.3 (H) 11.4 - 15.2 seconds   INR 1.6 (H) 0.8 - 1.2  CBC  Result Value Ref Range   WBC 0.7 (LL) 4.0 - 10.5 K/uL   RBC 3.18 (L) 3.87 - 5.11 MIL/uL   Hemoglobin 9.4 (L) 12.0 - 15.0 g/dL   HCT 27.8 (L) 36.0 - 46.0 %   MCV 87.4 80.0 - 100.0 fL   MCH 29.6 26.0 - 34.0 pg   MCHC 33.8 30.0 - 36.0 g/dL   RDW 17.5 (H) 11.5 - 15.5 %   Platelets 51 (L) 150 - 400 K/uL   nRBC 0.0 0.0 - 0.2 %  Basic metabolic panel  Result Value Ref Range   Sodium 134 (L) 135 - 145 mmol/L   Potassium 3.3 (L) 3.5 - 5.1 mmol/L   Chloride 105 98 - 111 mmol/L   CO2 21 (L) 22 - 32 mmol/L  Glucose, Bld 116 (H) 70 - 99 mg/dL   BUN 6 6 - 20 mg/dL   Creatinine, Ser 0.41 (L) 0.44 - 1.00 mg/dL   Calcium 7.5 (L) 8.9 - 10.3 mg/dL   GFR calc non Af Amer >60 >60 mL/min   GFR calc Af Amer >60 >60 mL/min   Anion gap 8 5 - 15  CBC with Differential/Platelet  Result Value Ref Range   WBC 1.0 (LL) 4.0 - 10.5 K/uL   RBC 3.20 (L) 3.87 - 5.11 MIL/uL   Hemoglobin 9.4 (L) 12.0 - 15.0 g/dL   HCT 29.0 (L) 36.0 - 46.0 %   MCV 90.6 80.0 - 100.0 fL   MCH 29.4 26.0 - 34.0 pg   MCHC 32.4 30.0 - 36.0 g/dL   RDW 17.2 (H) 11.5 - 15.5 %   Platelets 52 (L) 150 - 400 K/uL   nRBC 0.0 0.0 - 0.2 %   Neutrophils Relative % 90 %   Neutro Abs 0.9 (L) 1.7 - 7.7 K/uL   Lymphocytes Relative 5 %    Lymphs Abs 0.1 (L) 0.7 - 4.0 K/uL   Monocytes Relative 2 %   Monocytes Absolute 0.0 (L) 0.1 - 1.0 K/uL   Eosinophils Relative 1 %   Eosinophils Absolute 0.0 0.0 - 0.5 K/uL   Basophils Relative 1 %   Basophils Absolute 0.0 0.0 - 0.1 K/uL   WBC Morphology MILD LEFT SHIFT (1-5% METAS, OCC MYELO, OCC BANDS)    RBC Morphology MORPHOLOGY UNREMARKABLE    Smear Review Normal platelet morphology    Immature Granulocytes 1 %   Abs Immature Granulocytes 0.01 0.00 - 0.07 K/uL  Fibrinogen  Result Value Ref Range   Fibrinogen 213 210 - 475 mg/dL  Fibrin derivatives D-Dimer ([facility] only)  Result Value Ref Range   Fibrin derivatives D-dimer ([facility]) 5,925.03 (H) 0.00 - 499.00 ng/mL (FEU)  Lactate dehydrogenase  Result Value Ref Range   LDH 204 (H) 98 - 192 U/L  Haptoglobin  Result Value Ref Range   Haptoglobin 23 (L) 42 - 296 mg/dL  Urinalysis, Complete w Microscopic  Result Value Ref Range   Color, Urine YELLOW (A) YELLOW   APPearance CLEAR (A) CLEAR   Specific Gravity, Urine 1.009 1.005 - 1.030   pH 6.0 5.0 - 8.0   Glucose, UA NEGATIVE NEGATIVE mg/dL   Hgb urine dipstick NEGATIVE NEGATIVE   Bilirubin Urine NEGATIVE NEGATIVE   Ketones, ur NEGATIVE NEGATIVE mg/dL   Protein, ur NEGATIVE NEGATIVE mg/dL   Nitrite NEGATIVE NEGATIVE   Leukocytes,Ua TRACE (A) NEGATIVE   RBC / HPF 0-5 0 - 5 RBC/hpf   WBC, UA 0-5 0 - 5 WBC/hpf   Bacteria, UA NONE SEEN NONE SEEN   Squamous Epithelial / LPF 0-5 0 - 5  Procalcitonin - Baseline  Result Value Ref Range   Procalcitonin 2.78 ng/mL  Protime-INR  Result Value Ref Range   Prothrombin Time 20.9 (H) 11.4 - 15.2 seconds   INR 1.9 (H) 0.8 - 1.2  Procalcitonin  Result Value Ref Range   Procalcitonin 3.27 ng/mL  CBC with Differential/Platelet  Result Value Ref Range   WBC 5.0 4.0 - 10.5 K/uL   RBC 2.66 (L) 3.87 - 5.11 MIL/uL   Hemoglobin 7.8 (L) 12.0 - 15.0 g/dL   HCT 24.2 (L) 36.0 - 46.0 %   MCV 91.0 80.0 - 100.0 fL   MCH 29.3 26.0 - 34.0 pg   MCHC  32.2 30.0 - 36.0 g/dL   RDW 17.2 (H)  11.5 - 15.5 %   Platelets 59 (L) 150 - 400 K/uL   nRBC 0.0 0.0 - 0.2 %   Neutrophils Relative % 61 %   Neutro Abs 3.0 1.7 - 7.7 K/uL   Lymphocytes Relative 13 %   Lymphs Abs 0.7 0.7 - 4.0 K/uL   Monocytes Relative 23 %   Monocytes Absolute 1.1 (H) 0.1 - 1.0 K/uL   Eosinophils Relative 2 %   Eosinophils Absolute 0.1 0.0 - 0.5 K/uL   Basophils Relative 0 %   Basophils Absolute 0.0 0.0 - 0.1 K/uL   WBC Morphology MILD LEFT SHIFT (1-5% METAS, OCC MYELO, OCC BANDS)    RBC Morphology MORPHOLOGY UNREMARKABLE    Smear Review MIXED RBC POPULATION    Immature Granulocytes 1 %   Abs Immature Granulocytes 0.03 0.00 - 0.07 K/uL  Basic metabolic panel  Result Value Ref Range   Sodium 133 (L) 135 - 145 mmol/L   Potassium 4.0 3.5 - 5.1 mmol/L   Chloride 107 98 - 111 mmol/L   CO2 18 (L) 22 - 32 mmol/L   Glucose, Bld 100 (H) 70 - 99 mg/dL   BUN 6 6 - 20 mg/dL   Creatinine, Ser 0.34 (L) 0.44 - 1.00 mg/dL   Calcium 6.9 (L) 8.9 - 10.3 mg/dL   GFR calc non Af Amer >60 >60 mL/min   GFR calc Af Amer >60 >60 mL/min   Anion gap 8 5 - 15  APTT  Result Value Ref Range   aPTT 36 24 - 36 seconds  Hepatic function panel  Result Value Ref Range   Total Protein 4.7 (L) 6.5 - 8.1 g/dL   Albumin 2.0 (L) 3.5 - 5.0 g/dL   AST 76 (H) 15 - 41 U/L   ALT 62 (H) 0 - 44 U/L   Alkaline Phosphatase 91 38 - 126 U/L   Total Bilirubin 2.3 (H) 0.3 - 1.2 mg/dL   Bilirubin, Direct 1.1 (H) 0.0 - 0.2 mg/dL   Indirect Bilirubin 1.2 (H) 0.3 - 0.9 mg/dL  Pathologist smear review  Result Value Ref Range   Path Review Blood smear is reviewed.   Protime-INR  Result Value Ref Range   Prothrombin Time 18.3 (H) 11.4 - 15.2 seconds   INR 1.6 (H) 0.8 - 1.2  Procalcitonin  Result Value Ref Range   Procalcitonin 2.07 ng/mL  CBC with Differential/Platelet  Result Value Ref Range   WBC 2.4 (L) 4.0 - 10.5 K/uL   RBC 2.95 (L) 3.87 - 5.11 MIL/uL   Hemoglobin 8.7 (L) 12.0 - 15.0 g/dL   HCT  25.9 (L) 36.0 - 46.0 %   MCV 87.8 80.0 - 100.0 fL   MCH 29.5 26.0 - 34.0 pg   MCHC 33.6 30.0 - 36.0 g/dL   RDW 17.5 (H) 11.5 - 15.5 %   Platelets 70 (L) 150 - 400 K/uL   nRBC 0.0 0.0 - 0.2 %   Neutrophils Relative % 84 %   Neutro Abs 2.0 1.7 - 7.7 K/uL   Lymphocytes Relative 7 %   Lymphs Abs 0.2 (L) 0.7 - 4.0 K/uL   Monocytes Relative 8 %   Monocytes Absolute 0.2 0.1 - 1.0 K/uL   Eosinophils Relative 0 %   Eosinophils Absolute 0.0 0.0 - 0.5 K/uL   Basophils Relative 1 %   Basophils Absolute 0.0 0.0 - 0.1 K/uL   WBC Morphology MILD LEFT SHIFT (1-5% METAS, OCC MYELO, OCC BANDS)    RBC Morphology MORPHOLOGY UNREMARKABLE  Smear Review Normal platelet morphology    Immature Granulocytes 0 %   Abs Immature Granulocytes 0.01 0.00 - 0.07 K/uL  Comprehensive metabolic panel  Result Value Ref Range   Sodium 134 (L) 135 - 145 mmol/L   Potassium 3.5 3.5 - 5.1 mmol/L   Chloride 107 98 - 111 mmol/L   CO2 21 (L) 22 - 32 mmol/L   Glucose, Bld 90 70 - 99 mg/dL   BUN 7 6 - 20 mg/dL   Creatinine, Ser 0.43 (L) 0.44 - 1.00 mg/dL   Calcium 7.2 (L) 8.9 - 10.3 mg/dL   Total Protein 5.0 (L) 6.5 - 8.1 g/dL   Albumin 2.1 (L) 3.5 - 5.0 g/dL   AST 47 (H) 15 - 41 U/L   ALT 50 (H) 0 - 44 U/L   Alkaline Phosphatase 80 38 - 126 U/L   Total Bilirubin 2.7 (H) 0.3 - 1.2 mg/dL   GFR calc non Af Amer >60 >60 mL/min   GFR calc Af Amer >60 >60 mL/min   Anion gap 6 5 - 15  Protime-INR  Result Value Ref Range   Prothrombin Time 16.8 (H) 11.4 - 15.2 seconds   INR 1.4 (H) 0.8 - 1.2  CBC with Differential/Platelet  Result Value Ref Range   WBC 4.7 4.0 - 10.5 K/uL   RBC 2.89 (L) 3.87 - 5.11 MIL/uL   Hemoglobin 8.4 (L) 12.0 - 15.0 g/dL   HCT 25.7 (L) 36.0 - 46.0 %   MCV 88.9 80.0 - 100.0 fL   MCH 29.1 26.0 - 34.0 pg   MCHC 32.7 30.0 - 36.0 g/dL   RDW 17.6 (H) 11.5 - 15.5 %   Platelets 96 (L) 150 - 400 K/uL   nRBC 0.0 0.0 - 0.2 %   Neutrophils Relative % 56 %   Neutro Abs 2.7 1.7 - 7.7 K/uL    Lymphocytes Relative 20 %   Lymphs Abs 0.9 0.7 - 4.0 K/uL   Monocytes Relative 20 %   Monocytes Absolute 0.9 0.1 - 1.0 K/uL   Eosinophils Relative 2 %   Eosinophils Absolute 0.1 0.0 - 0.5 K/uL   Basophils Relative 1 %   Basophils Absolute 0.1 0.0 - 0.1 K/uL   Immature Granulocytes 1 %   Abs Immature Granulocytes 0.03 0.00 - 0.07 K/uL  Comprehensive metabolic panel  Result Value Ref Range   Sodium 137 135 - 145 mmol/L   Potassium 3.6 3.5 - 5.1 mmol/L   Chloride 109 98 - 111 mmol/L   CO2 23 22 - 32 mmol/L   Glucose, Bld 103 (H) 70 - 99 mg/dL   BUN 6 6 - 20 mg/dL   Creatinine, Ser 0.40 (L) 0.44 - 1.00 mg/dL   Calcium 7.4 (L) 8.9 - 10.3 mg/dL   Total Protein 4.8 (L) 6.5 - 8.1 g/dL   Albumin 2.0 (L) 3.5 - 5.0 g/dL   AST 43 (H) 15 - 41 U/L   ALT 41 0 - 44 U/L   Alkaline Phosphatase 79 38 - 126 U/L   Total Bilirubin 1.9 (H) 0.3 - 1.2 mg/dL   GFR calc non Af Amer >60 >60 mL/min   GFR calc Af Amer >60 >60 mL/min   Anion gap 5 5 - 15  Protime-INR  Result Value Ref Range   Prothrombin Time 17.4 (H) 11.4 - 15.2 seconds   INR 1.5 (H) 0.8 - 1.2  CBC with Differential/Platelet  Result Value Ref Range   WBC 5.5 4.0 - 10.5 K/uL  RBC 3.13 (L) 3.87 - 5.11 MIL/uL   Hemoglobin 9.0 (L) 12.0 - 15.0 g/dL   HCT 27.5 (L) 36.0 - 46.0 %   MCV 87.9 80.0 - 100.0 fL   MCH 28.8 26.0 - 34.0 pg   MCHC 32.7 30.0 - 36.0 g/dL   RDW 17.6 (H) 11.5 - 15.5 %   Platelets 125 (L) 150 - 400 K/uL   nRBC 0.0 0.0 - 0.2 %   Neutrophils Relative % 61 %   Neutro Abs 3.4 1.7 - 7.7 K/uL   Lymphocytes Relative 20 %   Lymphs Abs 1.1 0.7 - 4.0 K/uL   Monocytes Relative 15 %   Monocytes Absolute 0.8 0.1 - 1.0 K/uL   Eosinophils Relative 2 %   Eosinophils Absolute 0.1 0.0 - 0.5 K/uL   Basophils Relative 1 %   Basophils Absolute 0.1 0.0 - 0.1 K/uL   Immature Granulocytes 1 %   Abs Immature Granulocytes 0.03 0.00 - 0.07 K/uL  Protime-INR  Result Value Ref Range   Prothrombin Time 17.3 (H) 11.4 - 15.2 seconds   INR  1.5 (H) 0.8 - 1.2  Pregnancy, urine POC  Result Value Ref Range   Preg Test, Ur NEGATIVE NEGATIVE  Type and screen [facility]  Result Value Ref Range   ABO/RH(D) A POS    Antibody Screen NEG    Sample Expiration 10/26/2019,2359    Unit Number [id number]    Blood Component Type RED CELLS,LR    Unit division 00    Status of Unit ISSUED,FINAL    Transfusion Status OK TO TRANSFUSE    Crossmatch Result Compatible    Unit Number [id number]    Blood Component Type RED CELLS,LR    Unit division 00    Status of Unit ISSUED,FINAL    Transfusion Status OK TO TRANSFUSE    Crossmatch Result Compatible    Unit Number [id number]    Blood Component Type RBC LR PHER1    Unit division 00    Status of Unit ISSUED,FINAL    Transfusion Status OK TO TRANSFUSE    Crossmatch Result Compatible    Unit Number [id number]    Blood Component Type RED CELLS,LR    Unit division 00    Status of Unit ISSUED,FINAL    Transfusion Status OK TO TRANSFUSE    Crossmatch Result      Compatible Performed at [facility], [address] [place], [place] [address]   Prepare RBC (crossmatch)  Result Value Ref Range   Order Confirmation      ORDER PROCESSED BY BLOOD BANK Performed at [facility], [address] [place], [place] [address]   Prepare RBC (crossmatch)  Result Value Ref Range   Order Confirmation      ORDER PROCESSED BY BLOOD BANK Performed at [facility], [address], [place], [place] [address]   Prepare RBC (crossmatch)  Result Value Ref Range   Order Confirmation      ORDER PROCESSED BY BLOOD BANK Performed at [facility], [address] [place], [place] [address]   [facility]  Result Value Ref Range   ISSUE DATE / TIME [id number]    Blood Product Unit Number [id number]    PRODUCT CODE [id number]    Unit Type and Rh 9381    Blood Product Expiration Date 017510258527    ISSUE DATE / TIME [id number]     Blood Product Unit Number [id number]    PRODUCT CODE [id number]  Unit Type and Rh 6200    Blood Product Expiration Date 774142395320    ISSUE DATE / TIME 233435686168    Blood Product Unit Number [id number]    PRODUCT CODE [id number]    Unit Type and Rh 2244    Blood Product Expiration Date 975300511021    ISSUE DATE / TIME 117356701410    Blood Product Unit Number [id number]    PRODUCT CODE [id number]    Unit Type and Rh 6200    Blood Product Expiration Date 156153794327       Assessment & Plan:   Problem List Items Addressed This Visit      Digestive   Liver cirrhosis ([place])    Following with GI, recheck labs, continue to encourage alcohol cessation      Relevant Orders   Comprehensive metabolic panel   Hematemesis - Primary     Other   Thrombocytopenia ([place])    Recheck labs s/p discharge, no overt bleeding issues since d/c.       Relevant Orders   CBC with Differential/Platelet   Alcohol abuse    Still drinking, strongly recommended cessation. Declines services/counseling at this point      Insomnia    Will trial increased ambien dose and continue to monitor. Has Psychiatrist who can be following up on that at next appt       Other Visit Diagnoses    Encounter for management of intrauterine contraceptive device (IUD), unspecified IUD management type       Relevant Orders   Ambulatory referral to Gynecology   History of abnormal cervical Pap smear       Relevant Orders   Ambulatory referral to Gynecology       Follow up plan: Return in about 6 months (around 05/11/2020) for 6 month f/u.

## 2019-11-10 NOTE — Assessment & Plan Note (Signed)
Still drinking, strongly recommended cessation. Declines services/counseling at this point

## 2019-11-11 LAB — COMPREHENSIVE METABOLIC PANEL
ALT: 20 IU/L (ref 0–32)
AST: 41 IU/L — ABNORMAL HIGH (ref 0–40)
Albumin/Globulin Ratio: 0.9 — ABNORMAL LOW (ref 1.2–2.2)
Albumin: 3.4 g/dL — ABNORMAL LOW (ref 3.8–4.8)
Alkaline Phosphatase: 111 IU/L (ref 48–121)
BUN/Creatinine Ratio: 8 — ABNORMAL LOW (ref 9–23)
BUN: 4 mg/dL — ABNORMAL LOW (ref 6–24)
Bilirubin Total: 1.3 mg/dL — ABNORMAL HIGH (ref 0.0–1.2)
CO2: 24 mmol/L (ref 20–29)
Calcium: 8.2 mg/dL — ABNORMAL LOW (ref 8.7–10.2)
Chloride: 99 mmol/L (ref 96–106)
Creatinine, Ser: 0.49 mg/dL — ABNORMAL LOW (ref 0.57–1.00)
GFR calc Af Amer: 138 mL/min/{1.73_m2} (ref >59)
GFR calc non Af Amer: 120 mL/min/{1.73_m2} (ref >59)
Globulin, Total: 3.8 g/dL (ref 1.5–4.5)
Glucose: 93 mg/dL (ref 65–99)
Potassium: 3.4 mmol/L — ABNORMAL LOW (ref 3.5–5.2)
Sodium: 135 mmol/L (ref 134–144)
Total Protein: 7.2 g/dL (ref 6.0–8.5)

## 2019-11-11 LAB — CBC WITH DIFFERENTIAL/PLATELET
Basophils Absolute: 0.1 10*3/uL (ref 0.0–0.2)
Basos: 1 %
EOS (ABSOLUTE): 0.1 10*3/uL (ref 0.0–0.4)
Eos: 3 %
Hematocrit: 29.1 % — ABNORMAL LOW (ref 34.0–46.6)
Hemoglobin: 9.9 g/dL — ABNORMAL LOW (ref 11.1–15.9)
Immature Grans (Abs): 0 10*3/uL (ref 0.0–0.1)
Immature Granulocytes: 0 %
Lymphocytes Absolute: 1.4 10*3/uL (ref 0.7–3.1)
Lymphs: 25 %
MCH: 27.4 pg (ref 26.6–33.0)
MCHC: 34 g/dL (ref 31.5–35.7)
MCV: 81 fL (ref 79–97)
Monocytes Absolute: 0.6 10*3/uL (ref 0.1–0.9)
Monocytes: 11 %
Neutrophils Absolute: 3.4 10*3/uL (ref 1.4–7.0)
Neutrophils: 60 %
Platelets: 146 10*3/uL — ABNORMAL LOW (ref 150–450)
RBC: 3.61 x10E6/uL — ABNORMAL LOW (ref 3.77–5.28)
RDW: 15 % (ref 11.7–15.4)
WBC: 5.7 10*3/uL (ref 3.4–10.8)

## 2019-11-14 ENCOUNTER — Telehealth: Payer: Self-pay | Admitting: Obstetrics & Gynecology

## 2019-11-14 NOTE — Telephone Encounter (Signed)
CFP referring foroverdue for IUD removal and discussion regarding replacement, unknown pap hx but per patient has had an abnormal pap in the past and had a bx but unclear when or what the results were Voicemail box not set up unable to leave message.

## 2019-11-14 NOTE — Telephone Encounter (Signed)
Will try to contact spouse on the DPR . Left voicemail with spouse to have patient call back to be scheduled.

## 2019-11-15 NOTE — Telephone Encounter (Signed)
Voicemail box is not set up unable to leave message for patient to call back to be scheduled

## 2019-11-16 ENCOUNTER — Telehealth: Payer: Self-pay | Admitting: Family Medicine

## 2019-11-16 NOTE — Telephone Encounter (Signed)
Attempt to reach patient with phone number on file. Her cell phone number on file doesn't have an voicemail box. Called spouse on file number voicemail box not set up.

## 2019-11-16 NOTE — Telephone Encounter (Signed)
Yes, nothing concrete though
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
## 2019-11-16 NOTE — Telephone Encounter (Signed)
Do you have any concerns about this patient?

## 2019-11-17 NOTE — Telephone Encounter (Signed)
They need to know the kind of suspected concerns you have. Please provide and we will update.

## 2019-11-21 ENCOUNTER — Telehealth: Payer: Self-pay | Admitting: Family Medicine

## 2019-11-21 ENCOUNTER — Telehealth: Payer: Medicaid Other

## 2019-11-21 ENCOUNTER — Ambulatory Visit: Payer: Self-pay | Admitting: General Practice

## 2019-11-21 NOTE — Chronic Care Management (AMB) (Signed)
°  Chronic Care Management   Outreach Note  11/21/2019 Name: Rhonda Martin MRN: [id number] DOB: [birth year]  Referred by: Volney American, PA-C Reason for referral : Care Coordination (Referral from LCSW for help with Chronic Disease Management and Care coordination needs)   An unsuccessful telephone outreach was attempted today. The patient was referred to the case management team for assistance with care management and care coordination. Voicemail was not set up.   Follow Up Plan: The care management team will reach out to the patient again over the next 30 days.   Noreene Larsson RN, MSN, [facility] Mobile: [phone]

## 2019-11-21 NOTE — Telephone Encounter (Signed)
Called and notified Roni of Rachel's message.

## 2019-11-21 NOTE — Telephone Encounter (Signed)
Patient called to ask if her medical clearance has been approved.  Stated that her phone is not in service and she would like the office to call her husband, Rhonda Martin, who is on her DPR, to give information.  CB# [phone]

## 2019-11-21 NOTE — Telephone Encounter (Signed)
Rhonda Schneiders do you know anything about this?
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
## 2019-11-22 ENCOUNTER — Telehealth: Payer: Self-pay | Admitting: Family Medicine

## 2019-11-22 NOTE — Telephone Encounter (Signed)
I had spoken to [place] yesterday about this situation and I think she had reached back out. Not sure what that conversation looked like, will route to inquire

## 2019-11-22 NOTE — Chronic Care Management (AMB) (Signed)
  Care Management   Note  11/22/2019 Name: Rhonda Martin MRN: [id number] DOB: 1975-06-14  Rhonda Martin is a 44 y.o. year old female who is a primary care patient of Volney American, Hershal Coria and is actively engaged with the care management team. I reached out to [name] by phone today to assist with re-scheduling an initial visit with the RN Case Manager  Follow up plan: Patient has no voicemail setup. The care management team will reach out to the patient again over the next 7 days.   Noreene Larsson, [place], [place], [place] [address] Direct Dial: [phone] [email or web address]: [email or web address]

## 2019-11-23 ENCOUNTER — Emergency Department: Payer: Medicaid Other

## 2019-11-23 ENCOUNTER — Inpatient Hospital Stay
Admission: EM | Admit: 2019-11-23 | Discharge: 2019-12-04 | DRG: 809 | Disposition: A | Discharge status: Home / Self Care | Payer: Medicaid Other | Attending: Internal Medicine | Admitting: Internal Medicine

## 2019-11-23 ENCOUNTER — Other Ambulatory Visit: Payer: Self-pay

## 2019-11-23 ENCOUNTER — Encounter: Payer: Self-pay | Admitting: Emergency Medicine

## 2019-11-23 DIAGNOSIS — I959 Hypotension, unspecified: Secondary | ICD-10-CM | POA: Diagnosis present

## 2019-11-23 DIAGNOSIS — K219 Gastro-esophageal reflux disease without esophagitis: Secondary | ICD-10-CM | POA: Diagnosis present

## 2019-11-23 DIAGNOSIS — K922 Gastrointestinal hemorrhage, unspecified: Secondary | ICD-10-CM | POA: Diagnosis present

## 2019-11-23 DIAGNOSIS — K7290 Hepatic failure, unspecified without coma: Secondary | ICD-10-CM | POA: Diagnosis present

## 2019-11-23 DIAGNOSIS — M25561 Pain in right knee: Secondary | ICD-10-CM

## 2019-11-23 DIAGNOSIS — F10931 Alcohol use, unspecified with withdrawal delirium: Secondary | ICD-10-CM

## 2019-11-23 DIAGNOSIS — D61818 Other pancytopenia: Secondary | ICD-10-CM

## 2019-11-23 DIAGNOSIS — Z833 Family history of diabetes mellitus: Secondary | ICD-10-CM

## 2019-11-23 DIAGNOSIS — E876 Hypokalemia: Secondary | ICD-10-CM | POA: Diagnosis present

## 2019-11-23 DIAGNOSIS — B1920 Unspecified viral hepatitis C without hepatic coma: Secondary | ICD-10-CM | POA: Diagnosis present

## 2019-11-23 DIAGNOSIS — E871 Hypo-osmolality and hyponatremia: Secondary | ICD-10-CM | POA: Diagnosis present

## 2019-11-23 DIAGNOSIS — K7460 Unspecified cirrhosis of liver: Secondary | ICD-10-CM

## 2019-11-23 DIAGNOSIS — F10231 Alcohol dependence with withdrawal delirium: Secondary | ICD-10-CM | POA: Diagnosis not present

## 2019-11-23 DIAGNOSIS — R710 Precipitous drop in hematocrit: Secondary | ICD-10-CM | POA: Diagnosis not present

## 2019-11-23 DIAGNOSIS — R1110 Vomiting, unspecified: Secondary | ICD-10-CM

## 2019-11-23 DIAGNOSIS — R509 Fever, unspecified: Secondary | ICD-10-CM

## 2019-11-23 DIAGNOSIS — Z8249 Family history of ischemic heart disease and other diseases of the circulatory system: Secondary | ICD-10-CM

## 2019-11-23 DIAGNOSIS — R531 Weakness: Secondary | ICD-10-CM

## 2019-11-23 DIAGNOSIS — R1084 Generalized abdominal pain: Secondary | ICD-10-CM

## 2019-11-23 DIAGNOSIS — R112 Nausea with vomiting, unspecified: Secondary | ICD-10-CM | POA: Diagnosis not present

## 2019-11-23 DIAGNOSIS — F17210 Nicotine dependence, cigarettes, uncomplicated: Secondary | ICD-10-CM | POA: Diagnosis present

## 2019-11-23 DIAGNOSIS — Z79899 Other long term (current) drug therapy: Secondary | ICD-10-CM

## 2019-11-23 DIAGNOSIS — R443 Hallucinations, unspecified: Secondary | ICD-10-CM

## 2019-11-23 DIAGNOSIS — N390 Urinary tract infection, site not specified: Secondary | ICD-10-CM | POA: Diagnosis present

## 2019-11-23 DIAGNOSIS — K209 Esophagitis, unspecified without bleeding: Secondary | ICD-10-CM | POA: Diagnosis present

## 2019-11-23 DIAGNOSIS — M25461 Effusion, right knee: Secondary | ICD-10-CM | POA: Diagnosis not present

## 2019-11-23 DIAGNOSIS — F329 Major depressive disorder, single episode, unspecified: Secondary | ICD-10-CM | POA: Diagnosis present

## 2019-11-23 DIAGNOSIS — Z20822 Contact with and (suspected) exposure to covid-19: Secondary | ICD-10-CM | POA: Diagnosis present

## 2019-11-23 DIAGNOSIS — G629 Polyneuropathy, unspecified: Secondary | ICD-10-CM | POA: Diagnosis present

## 2019-11-23 DIAGNOSIS — R000 Tachycardia, unspecified: Secondary | ICD-10-CM

## 2019-11-23 DIAGNOSIS — K7030 Alcoholic cirrhosis of liver without ascites: Secondary | ICD-10-CM | POA: Diagnosis present

## 2019-11-23 DIAGNOSIS — G8929 Other chronic pain: Secondary | ICD-10-CM | POA: Diagnosis present

## 2019-11-23 LAB — GRAM STAIN

## 2019-11-23 LAB — CBC
HCT: 32.9 % — ABNORMAL LOW (ref 36.0–46.0)
HCT: 27.4 % — ABNORMAL LOW (ref 36.0–46.0)
Hemoglobin: 10.8 g/dL — ABNORMAL LOW (ref 12.0–15.0)
Hemoglobin: 9.5 g/dL — ABNORMAL LOW (ref 12.0–15.0)
MCH: 26.7 pg (ref 26.0–34.0)
MCH: 27.4 pg (ref 26.0–34.0)
MCHC: 32.8 g/dL (ref 30.0–36.0)
MCHC: 34.7 g/dL (ref 30.0–36.0)
MCV: 81.4 fL (ref 80.0–100.0)
MCV: 79 fL — ABNORMAL LOW (ref 80.0–100.0)
Platelets: 107 10*3/uL — ABNORMAL LOW (ref 150–400)
Platelets: 63 10*3/uL — ABNORMAL LOW (ref 150–400)
RBC: 4.04 MIL/uL (ref 3.87–5.11)
RBC: 3.47 MIL/uL — ABNORMAL LOW (ref 3.87–5.11)
RDW: 18 % — ABNORMAL HIGH (ref 11.5–15.5)
RDW: 18.1 % — ABNORMAL HIGH (ref 11.5–15.5)
WBC: 13.3 10*3/uL — ABNORMAL HIGH (ref 4.0–10.5)
WBC: 7.4 10*3/uL (ref 4.0–10.5)
nRBC: 0 % (ref 0.0–0.2)
nRBC: 0 % (ref 0.0–0.2)

## 2019-11-23 LAB — URINALYSIS, COMPLETE (UACMP) WITH MICROSCOPIC
Bilirubin Urine: NEGATIVE
Glucose, UA: NEGATIVE mg/dL
Hgb urine dipstick: NEGATIVE
Ketones, ur: 20 mg/dL — AB
Nitrite: NEGATIVE
Protein, ur: 30 mg/dL — AB
Specific Gravity, Urine: 1.014 (ref 1.005–1.030)
Squamous Epithelial / HPF: >50 — ABNORMAL HIGH (ref 0–5)
pH: 5 (ref 5.0–8.0)

## 2019-11-23 LAB — COMPREHENSIVE METABOLIC PANEL
ALT: 49 U/L — ABNORMAL HIGH (ref 0–44)
AST: 161 U/L — ABNORMAL HIGH (ref 15–41)
Albumin: 3.5 g/dL (ref 3.5–5.0)
Alkaline Phosphatase: 86 U/L (ref 38–126)
Anion gap: 22 — ABNORMAL HIGH (ref 5–15)
BUN: 12 mg/dL (ref 6–20)
CO2: 16 mmol/L — ABNORMAL LOW (ref 22–32)
Calcium: 8.6 mg/dL — ABNORMAL LOW (ref 8.9–10.3)
Chloride: 93 mmol/L — ABNORMAL LOW (ref 98–111)
Creatinine, Ser: 0.6 mg/dL (ref 0.44–1.00)
GFR calc Af Amer: >60 mL/min (ref >60)
GFR calc non Af Amer: >60 mL/min (ref >60)
Glucose, Bld: 90 mg/dL (ref 70–99)
Potassium: 3.6 mmol/L (ref 3.5–5.1)
Sodium: 131 mmol/L — ABNORMAL LOW (ref 135–145)
Total Bilirubin: 3.7 mg/dL — ABNORMAL HIGH (ref 0.3–1.2)
Total Protein: 7.9 g/dL (ref 6.5–8.1)

## 2019-11-23 LAB — PROTIME-INR
INR: 1.6 — ABNORMAL HIGH (ref 0.8–1.2)
Prothrombin Time: 18.4 seconds — ABNORMAL HIGH (ref 11.4–15.2)

## 2019-11-23 LAB — BODY FLUID CELL COUNT WITH DIFFERENTIAL
Eos, Fluid: 0 %
Lymphs, Fluid: 62 %
Monocyte-Macrophage-Serous Fluid: 12 % — ABNORMAL LOW (ref 50–90)
Neutrophil Count, Fluid: 26 % — ABNORMAL HIGH (ref 0–25)
Total Nucleated Cell Count, Fluid: 804 cu mm (ref 0–1000)

## 2019-11-23 LAB — APTT: aPTT: 36 seconds (ref 24–36)

## 2019-11-23 LAB — SYNOVIAL FLUID, CRYSTAL: Crystals, Fluid: NONE SEEN

## 2019-11-23 LAB — TYPE AND SCREEN
ABO/RH(D): A POS
Antibody Screen: NEGATIVE

## 2019-11-23 LAB — POCT PREGNANCY, URINE: Preg Test, Ur: NEGATIVE

## 2019-11-23 LAB — LIPASE, BLOOD: Lipase: 37 U/L (ref 11–51)

## 2019-11-23 LAB — LACTIC ACID, PLASMA: Lactic Acid, Venous: 2.1 mmol/L (ref 0.5–1.9)

## 2019-11-23 MED ORDER — SODIUM CHLORIDE 0.9 % IV SOLN
8.0000 mg/h | INTRAVENOUS | Status: DC
  Administered 2019-11-24 (×2): 8 mg/h via INTRAVENOUS
  Filled 2019-11-23 (×2): qty 80

## 2019-11-23 MED ORDER — LIDOCAINE HCL (PF) 1 % IJ SOLN
5.0000 mL | Freq: Once | INTRAMUSCULAR | Status: AC
  Administered 2019-11-23: 5 mL
  Filled 2019-11-23: qty 5

## 2019-11-23 MED ORDER — SODIUM CHLORIDE 0.9 % IV BOLUS
1000.0000 mL | Freq: Once | INTRAVENOUS | Status: AC
  Administered 2019-11-24: 1000 mL via INTRAVENOUS

## 2019-11-23 MED ORDER — PANTOPRAZOLE SODIUM 40 MG IV SOLR: 40.0000 mg | Freq: Two times a day (BID) | INTRAVENOUS | Status: DC

## 2019-11-23 MED ORDER — SODIUM CHLORIDE 0.9 % IV SOLN
80.0000 mg | Freq: Once | INTRAVENOUS | Status: AC
  Administered 2019-11-24: 80 mg via INTRAVENOUS
  Filled 2019-11-23: qty 80

## 2019-11-23 MED ORDER — SODIUM CHLORIDE 0.9 % IV SOLN
2.0000 g | Freq: Once | INTRAVENOUS | Status: AC
  Administered 2019-11-24: 2 g via INTRAVENOUS
  Filled 2019-11-23: qty 2

## 2019-11-23 MED ORDER — SODIUM CHLORIDE 0.9 % IV BOLUS
1000.0000 mL | Freq: Once | INTRAVENOUS | Status: AC
  Administered 2019-11-23: 1000 mL via INTRAVENOUS

## 2019-11-23 MED ORDER — IOHEXOL 300 MG/ML  SOLN
75.0000 mL | Freq: Once | INTRAMUSCULAR | Status: AC | PRN
  Administered 2019-11-23: 75 mL via INTRAVENOUS

## 2019-11-23 MED ORDER — HALOPERIDOL LACTATE 5 MG/ML IJ SOLN
5.0000 mg | Freq: Once | INTRAMUSCULAR | Status: AC
  Administered 2019-11-23: 5 mg via INTRAVENOUS
  Filled 2019-11-23: qty 1

## 2019-11-23 MED ORDER — SODIUM CHLORIDE 0.9% FLUSH: 3.0000 mL | Freq: Once | INTRAVENOUS | Status: DC

## 2019-11-23 MED ORDER — VANCOMYCIN HCL IN DEXTROSE 1-5 GM/200ML-% IV SOLN
1000.0000 mg | Freq: Once | INTRAVENOUS | Status: AC
  Administered 2019-11-24: 1000 mg via INTRAVENOUS
  Filled 2019-11-23: qty 200

## 2019-11-23 NOTE — ED Notes (Signed)
CRITICAL VALUE: Lactate 2.1  DATE & TIME NOTIFIED: 11/23/19 2344  MESSENGER (representative from lab): Lab tech  MD NOTIFIED: Marjean Donna  TIME OF NOTIFICATION: 11/23/19 2344  RESPONSE: Pending

## 2019-11-23 NOTE — ED Notes (Signed)
Attempted x 2 to start IV, unsuccessful.  Phlebotomy drawing blood work.

## 2019-11-23 NOTE — ED Triage Notes (Signed)
ARrives with C/o emesis x 1 day. Patient states she is concerned about esophageal tear.  Also c/o right knee pain.  STates she fell on landed on knee a few days. Ago.

## 2019-11-23 NOTE — Telephone Encounter (Signed)
Tried to contact the pt several times but the number listed goes straight to a VM that has not been setup. Will continue to reach out to pt.

## 2019-11-23 NOTE — ED Notes (Signed)
Pt provided w/ ice per request and MD permission.

## 2019-11-23 NOTE — ED Notes (Signed)
IV team at bedside 

## 2019-11-23 NOTE — ED Notes (Signed)
Provided pt w/ phone to contact family.
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
## 2019-11-23 NOTE — ED Notes (Signed)
Lab at bedside to collect APTT, protime, and Type and screen.

## 2019-11-23 NOTE — ED Notes (Signed)
Responded to call out, pt stated monitor was alarming. No alarms at this time. Pt provided with ice water.

## 2019-11-24 ENCOUNTER — Encounter: Payer: Self-pay | Admitting: Family Medicine

## 2019-11-24 ENCOUNTER — Other Ambulatory Visit: Payer: Self-pay

## 2019-11-24 DIAGNOSIS — I959 Hypotension, unspecified: Secondary | ICD-10-CM | POA: Diagnosis present

## 2019-11-24 DIAGNOSIS — E871 Hypo-osmolality and hyponatremia: Secondary | ICD-10-CM | POA: Diagnosis present

## 2019-11-24 DIAGNOSIS — N390 Urinary tract infection, site not specified: Secondary | ICD-10-CM | POA: Diagnosis present

## 2019-11-24 DIAGNOSIS — M25461 Effusion, right knee: Secondary | ICD-10-CM | POA: Diagnosis not present

## 2019-11-24 DIAGNOSIS — D7589 Other specified diseases of blood and blood-forming organs: Secondary | ICD-10-CM | POA: Diagnosis not present

## 2019-11-24 DIAGNOSIS — K254 Chronic or unspecified gastric ulcer with hemorrhage: Secondary | ICD-10-CM

## 2019-11-24 DIAGNOSIS — K209 Esophagitis, unspecified without bleeding: Secondary | ICD-10-CM | POA: Diagnosis present

## 2019-11-24 DIAGNOSIS — K922 Gastrointestinal hemorrhage, unspecified: Secondary | ICD-10-CM | POA: Diagnosis present

## 2019-11-24 DIAGNOSIS — G621 Alcoholic polyneuropathy: Secondary | ICD-10-CM

## 2019-11-24 DIAGNOSIS — Z833 Family history of diabetes mellitus: Secondary | ICD-10-CM | POA: Diagnosis not present

## 2019-11-24 DIAGNOSIS — F329 Major depressive disorder, single episode, unspecified: Secondary | ICD-10-CM | POA: Diagnosis present

## 2019-11-24 DIAGNOSIS — F10929 Alcohol use, unspecified with intoxication, unspecified: Secondary | ICD-10-CM | POA: Diagnosis not present

## 2019-11-24 DIAGNOSIS — E876 Hypokalemia: Secondary | ICD-10-CM | POA: Diagnosis present

## 2019-11-24 DIAGNOSIS — G629 Polyneuropathy, unspecified: Secondary | ICD-10-CM | POA: Diagnosis present

## 2019-11-24 DIAGNOSIS — J9811 Atelectasis: Secondary | ICD-10-CM | POA: Diagnosis not present

## 2019-11-24 DIAGNOSIS — K219 Gastro-esophageal reflux disease without esophagitis: Secondary | ICD-10-CM | POA: Diagnosis present

## 2019-11-24 DIAGNOSIS — R112 Nausea with vomiting, unspecified: Secondary | ICD-10-CM | POA: Diagnosis not present

## 2019-11-24 DIAGNOSIS — R509 Fever, unspecified: Secondary | ICD-10-CM | POA: Diagnosis not present

## 2019-11-24 DIAGNOSIS — Z20822 Contact with and (suspected) exposure to covid-19: Secondary | ICD-10-CM | POA: Diagnosis present

## 2019-11-24 DIAGNOSIS — R1084 Generalized abdominal pain: Secondary | ICD-10-CM | POA: Diagnosis not present

## 2019-11-24 DIAGNOSIS — R1110 Vomiting, unspecified: Secondary | ICD-10-CM | POA: Diagnosis not present

## 2019-11-24 DIAGNOSIS — R443 Hallucinations, unspecified: Secondary | ICD-10-CM | POA: Diagnosis not present

## 2019-11-24 DIAGNOSIS — D61818 Other pancytopenia: Secondary | ICD-10-CM | POA: Diagnosis present

## 2019-11-24 DIAGNOSIS — G8929 Other chronic pain: Secondary | ICD-10-CM | POA: Diagnosis present

## 2019-11-24 DIAGNOSIS — K7030 Alcoholic cirrhosis of liver without ascites: Secondary | ICD-10-CM | POA: Diagnosis present

## 2019-11-24 DIAGNOSIS — F10231 Alcohol dependence with withdrawal delirium: Secondary | ICD-10-CM | POA: Diagnosis not present

## 2019-11-24 DIAGNOSIS — B1920 Unspecified viral hepatitis C without hepatic coma: Secondary | ICD-10-CM | POA: Diagnosis present

## 2019-11-24 DIAGNOSIS — F17210 Nicotine dependence, cigarettes, uncomplicated: Secondary | ICD-10-CM | POA: Diagnosis present

## 2019-11-24 DIAGNOSIS — R710 Precipitous drop in hematocrit: Secondary | ICD-10-CM | POA: Diagnosis not present

## 2019-11-24 DIAGNOSIS — R000 Tachycardia, unspecified: Secondary | ICD-10-CM | POA: Diagnosis not present

## 2019-11-24 DIAGNOSIS — Z8249 Family history of ischemic heart disease and other diseases of the circulatory system: Secondary | ICD-10-CM | POA: Diagnosis not present

## 2019-11-24 DIAGNOSIS — Z79899 Other long term (current) drug therapy: Secondary | ICD-10-CM | POA: Diagnosis not present

## 2019-11-24 DIAGNOSIS — K7290 Hepatic failure, unspecified without coma: Secondary | ICD-10-CM | POA: Diagnosis present

## 2019-11-24 DIAGNOSIS — F10239 Alcohol dependence with withdrawal, unspecified: Secondary | ICD-10-CM | POA: Diagnosis not present

## 2019-11-24 LAB — BASIC METABOLIC PANEL
Anion gap: 14 (ref 5–15)
BUN: 7 mg/dL (ref 6–20)
CO2: 17 mmol/L — ABNORMAL LOW (ref 22–32)
Calcium: 7.4 mg/dL — ABNORMAL LOW (ref 8.9–10.3)
Chloride: 101 mmol/L (ref 98–111)
Creatinine, Ser: 0.59 mg/dL (ref 0.44–1.00)
GFR calc Af Amer: >60 mL/min (ref >60)
GFR calc non Af Amer: >60 mL/min (ref >60)
Glucose, Bld: 85 mg/dL (ref 70–99)
Potassium: 2.9 mmol/L — ABNORMAL LOW (ref 3.5–5.1)
Sodium: 132 mmol/L — ABNORMAL LOW (ref 135–145)

## 2019-11-24 LAB — CBC
HCT: 26.9 % — ABNORMAL LOW (ref 36.0–46.0)
Hemoglobin: 8.8 g/dL — ABNORMAL LOW (ref 12.0–15.0)
MCH: 27.2 pg (ref 26.0–34.0)
MCHC: 32.7 g/dL (ref 30.0–36.0)
MCV: 83.3 fL (ref 80.0–100.0)
Platelets: 57 10*3/uL — ABNORMAL LOW (ref 150–400)
RBC: 3.23 MIL/uL — ABNORMAL LOW (ref 3.87–5.11)
RDW: 17.8 % — ABNORMAL HIGH (ref 11.5–15.5)
WBC: 5.4 10*3/uL (ref 4.0–10.5)
nRBC: 0 % (ref 0.0–0.2)

## 2019-11-24 LAB — PROTIME-INR
INR: 1.8 — ABNORMAL HIGH (ref 0.8–1.2)
Prothrombin Time: 20.3 seconds — ABNORMAL HIGH (ref 11.4–15.2)

## 2019-11-24 LAB — PROCALCITONIN: Procalcitonin: <0.1 ng/mL

## 2019-11-24 LAB — PHOSPHORUS: Phosphorus: 1.1 mg/dL — ABNORMAL LOW (ref 2.5–4.6)

## 2019-11-24 LAB — MAGNESIUM: Magnesium: 1.4 mg/dL — ABNORMAL LOW (ref 1.7–2.4)

## 2019-11-24 LAB — LACTIC ACID, PLASMA: Lactic Acid, Venous: 0.7 mmol/L (ref 0.5–1.9)

## 2019-11-24 LAB — SARS CORONAVIRUS 2 BY RT PCR (HOSPITAL ORDER, PERFORMED IN ~~LOC~~ HOSPITAL LAB): SARS Coronavirus 2: NEGATIVE

## 2019-11-24 LAB — CORTISOL-AM, BLOOD: Cortisol - AM: 19.9 ug/dL (ref 6.7–22.6)

## 2019-11-24 MED ORDER — ZOLPIDEM TARTRATE 5 MG PO TABS
5.0000 mg | ORAL_TABLET | Freq: Every evening | ORAL | Status: DC | PRN
  Administered 2019-11-25 – 2019-12-02 (×6): 5 mg via ORAL
  Filled 2019-11-24 (×6): qty 1

## 2019-11-24 MED ORDER — SODIUM CHLORIDE 0.9 % IV SOLN: 8.0000 mg/h | INTRAVENOUS | Status: DC

## 2019-11-24 MED ORDER — ONDANSETRON HCL 4 MG PO TABS: 4.0000 mg | ORAL_TABLET | Freq: Four times a day (QID) | ORAL | Status: DC | PRN

## 2019-11-24 MED ORDER — LORAZEPAM 2 MG/ML IJ SOLN
1.0000 mg | INTRAMUSCULAR | Status: AC | PRN
  Administered 2019-11-25 (×2): 1 mg via INTRAVENOUS
  Administered 2019-11-25 – 2019-11-26 (×2): 2 mg via INTRAVENOUS
  Administered 2019-11-27 (×2): 4 mg via INTRAVENOUS
  Administered 2019-11-27: 2 mg via INTRAVENOUS
  Filled 2019-11-24: qty 1
  Filled 2019-11-24 (×2): qty 2
  Filled 2019-11-24 (×2): qty 1

## 2019-11-24 MED ORDER — HYDROXYZINE HCL 25 MG PO TABS
25.0000 mg | ORAL_TABLET | Freq: Three times a day (TID) | ORAL | Status: DC | PRN
  Administered 2019-11-29 – 2019-12-02 (×2): 25 mg via ORAL
  Filled 2019-11-24 (×5): qty 1

## 2019-11-24 MED ORDER — SODIUM CHLORIDE 0.9 % IV SOLN
INTRAVENOUS | Status: DC
  Administered 2019-11-25 – 2019-11-28 (×5): 100 mL/h via INTRAVENOUS

## 2019-11-24 MED ORDER — FERROUS SULFATE 325 (65 FE) MG PO TABS
325.0000 mg | ORAL_TABLET | Freq: Two times a day (BID) | ORAL | Status: DC
  Filled 2019-11-24: qty 1

## 2019-11-24 MED ORDER — LORAZEPAM 2 MG/ML IJ SOLN
1.0000 mg | INTRAMUSCULAR | Status: DC | PRN
  Administered 2019-11-25 – 2019-11-28 (×5): 1 mg via INTRAVENOUS
  Filled 2019-11-24 (×5): qty 1

## 2019-11-24 MED ORDER — ACETAMINOPHEN 325 MG PO TABS
650.0000 mg | ORAL_TABLET | Freq: Four times a day (QID) | ORAL | Status: DC | PRN
  Administered 2019-11-30: 650 mg via ORAL
  Filled 2019-11-24: qty 2

## 2019-11-24 MED ORDER — ONDANSETRON HCL 4 MG/2ML IJ SOLN
4.0000 mg | Freq: Four times a day (QID) | INTRAMUSCULAR | Status: DC | PRN
  Administered 2019-11-24 – 2019-11-30 (×2): 4 mg via INTRAVENOUS
  Filled 2019-11-24 (×2): qty 2

## 2019-11-24 MED ORDER — LORAZEPAM 2 MG/ML IJ SOLN
0.0000 mg | Freq: Three times a day (TID) | INTRAMUSCULAR | Status: AC
  Administered 2019-11-27: 2 mg via INTRAVENOUS
  Administered 2019-11-28: 1 mg via INTRAVENOUS
  Administered 2019-11-28: 4 mg via INTRAVENOUS
  Filled 2019-11-24: qty 1
  Filled 2019-11-24: qty 2
  Filled 2019-11-24 (×3): qty 1

## 2019-11-24 MED ORDER — DICYCLOMINE HCL 20 MG PO TABS
20.0000 mg | ORAL_TABLET | Freq: Four times a day (QID) | ORAL | Status: DC | PRN
  Administered 2019-11-24 – 2019-11-25 (×2): 20 mg via ORAL
  Filled 2019-11-24 (×3): qty 1

## 2019-11-24 MED ORDER — LORAZEPAM 1 MG PO TABS
1.0000 mg | ORAL_TABLET | ORAL | Status: AC | PRN
  Filled 2019-11-24: qty 2

## 2019-11-24 MED ORDER — THIAMINE HCL 100 MG PO TABS
100.0000 mg | ORAL_TABLET | Freq: Every day | ORAL | Status: DC
  Administered 2019-11-24 – 2019-12-01 (×4): 100 mg via ORAL
  Filled 2019-11-24 (×4): qty 1

## 2019-11-24 MED ORDER — CYANOCOBALAMIN 500 MCG PO TABS
250.0000 ug | ORAL_TABLET | Freq: Every day | ORAL | Status: DC
  Administered 2019-11-24 – 2019-12-04 (×7): 250 ug via ORAL
  Filled 2019-11-24: qty 1
  Filled 2019-11-24: qty 3
  Filled 2019-11-24 (×6): qty 1

## 2019-11-24 MED ORDER — METOPROLOL TARTRATE 5 MG/5ML IV SOLN
5.0000 mg | Freq: Three times a day (TID) | INTRAVENOUS | Status: DC | PRN
  Administered 2019-11-24 – 2019-11-30 (×4): 5 mg via INTRAVENOUS
  Filled 2019-11-24 (×4): qty 5

## 2019-11-24 MED ORDER — LORAZEPAM 2 MG/ML IJ SOLN
0.0000 mg | INTRAMUSCULAR | Status: AC
  Administered 2019-11-24: 2 mg via INTRAVENOUS
  Administered 2019-11-24: 1 mg via INTRAVENOUS
  Administered 2019-11-25 – 2019-11-26 (×4): 2 mg via INTRAVENOUS
  Filled 2019-11-24 (×5): qty 1

## 2019-11-24 MED ORDER — PANTOPRAZOLE SODIUM 40 MG IV SOLR: 40.0000 mg | Freq: Two times a day (BID) | INTRAVENOUS | Status: DC

## 2019-11-24 MED ORDER — FOLIC ACID 1 MG PO TABS: 1.0000 mg | ORAL_TABLET | Freq: Every day | ORAL | Status: DC

## 2019-11-24 MED ORDER — POTASSIUM CHLORIDE 10 MEQ/100ML IV SOLN
10.0000 meq | INTRAVENOUS | Status: AC
  Administered 2019-11-24 (×4): 10 meq via INTRAVENOUS
  Filled 2019-11-24 (×4): qty 100

## 2019-11-24 MED ORDER — SODIUM CHLORIDE 0.9 % IV SOLN
1.0000 g | INTRAVENOUS | Status: DC
  Administered 2019-11-24 – 2019-11-27 (×4): 1 g via INTRAVENOUS
  Filled 2019-11-24: qty 10
  Filled 2019-11-24 (×3): qty 1

## 2019-11-24 MED ORDER — ADULT MULTIVITAMIN W/MINERALS CH
1.0000 | ORAL_TABLET | Freq: Every day | ORAL | Status: DC
  Administered 2019-11-24: 1 via ORAL
  Filled 2019-11-24: qty 1

## 2019-11-24 MED ORDER — LORAZEPAM 2 MG/ML IJ SOLN: 1.0000 mg | INTRAMUSCULAR | Status: DC | PRN

## 2019-11-24 MED ORDER — MORPHINE SULFATE (PF) 2 MG/ML IV SOLN
1.0000 mg | INTRAVENOUS | Status: DC | PRN
  Administered 2019-11-24 – 2019-12-02 (×24): 1 mg via INTRAVENOUS
  Filled 2019-11-24 (×24): qty 1

## 2019-11-24 MED ORDER — OXYCODONE HCL 5 MG PO TABS
5.0000 mg | ORAL_TABLET | Freq: Four times a day (QID) | ORAL | Status: DC | PRN
  Administered 2019-11-24 – 2019-12-04 (×16): 5 mg via ORAL
  Filled 2019-11-24 (×17): qty 1

## 2019-11-24 MED ORDER — FOLIC ACID 1 MG PO TABS
1.0000 mg | ORAL_TABLET | Freq: Every day | ORAL | Status: DC
  Administered 2019-11-24 – 2019-12-04 (×7): 1 mg via ORAL
  Filled 2019-11-24 (×7): qty 1

## 2019-11-24 MED ORDER — PANTOPRAZOLE SODIUM 40 MG PO TBEC
40.0000 mg | DELAYED_RELEASE_TABLET | Freq: Every day | ORAL | Status: DC
  Administered 2019-11-24 – 2019-12-04 (×7): 40 mg via ORAL
  Filled 2019-11-24 (×7): qty 1

## 2019-11-24 MED ORDER — MIDODRINE HCL 5 MG PO TABS
5.0000 mg | ORAL_TABLET | Freq: Two times a day (BID) | ORAL | Status: DC | PRN
  Filled 2019-11-24: qty 1

## 2019-11-24 MED ORDER — ADULT MULTIVITAMIN W/MINERALS CH
1.0000 | ORAL_TABLET | Freq: Every day | ORAL | Status: DC
  Administered 2019-11-29 – 2019-12-04 (×6): 1 via ORAL
  Filled 2019-11-24 (×6): qty 1

## 2019-11-24 MED ORDER — GABAPENTIN 300 MG PO CAPS
300.0000 mg | ORAL_CAPSULE | Freq: Three times a day (TID) | ORAL | Status: DC | PRN
  Administered 2019-11-24 – 2019-12-03 (×4): 300 mg via ORAL
  Filled 2019-11-24 (×4): qty 1

## 2019-11-24 MED ORDER — DULOXETINE HCL 20 MG PO CPEP
20.0000 mg | ORAL_CAPSULE | Freq: Every day | ORAL | Status: DC
  Administered 2019-11-24 – 2019-12-04 (×7): 20 mg via ORAL
  Filled 2019-11-24 (×12): qty 1

## 2019-11-24 MED ORDER — THIAMINE HCL 100 MG PO TABS: 100.0000 mg | ORAL_TABLET | Freq: Every day | ORAL | Status: DC

## 2019-11-24 MED ORDER — ACETAMINOPHEN 650 MG RE SUPP: 650.0000 mg | Freq: Four times a day (QID) | RECTAL | Status: DC | PRN

## 2019-11-24 MED ORDER — THIAMINE HCL 100 MG/ML IJ SOLN: 100.0000 mg | Freq: Every day | INTRAMUSCULAR | Status: DC

## 2019-11-24 MED ORDER — TRAZODONE HCL 50 MG PO TABS: 25.0000 mg | ORAL_TABLET | Freq: Every evening | ORAL | Status: DC | PRN

## 2019-11-24 MED ORDER — SUCRALFATE 1 G PO TABS
1.0000 g | ORAL_TABLET | Freq: Three times a day (TID) | ORAL | Status: DC
  Administered 2019-11-24 – 2019-12-04 (×27): 1 g via ORAL
  Filled 2019-11-24 (×28): qty 1

## 2019-11-24 MED ORDER — TRIAMCINOLONE ACETONIDE 0.1 % EX CREA
1.0000 "application " | TOPICAL_CREAM | Freq: Two times a day (BID) | CUTANEOUS | Status: DC | PRN
  Filled 2019-11-24: qty 15
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
## 2019-11-24 NOTE — Progress Notes (Signed)
PROGRESS NOTE    Rhonda Martin  XKG:[id number] DOB: [birth year] DOA: 11/23/2019 PCP: Volney American, PA-C   Assessment & Plan:   Active Problems:   GI bleeding   GI bleeding: etiology unclear, possible upper GI bleed. Continue on IV protonix. Will monitor H&H. Continue on carafate. 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 Possible hx of esophageal varices. Ativan prn/  Alcohol abuse: alcohol cessation counseling. 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 Will continue to monitor H&H & platelets   Hypokalemia: KCl repleted. Will continue to monitor  Hyponatremia: likely secondary to dehydration. Continue on IVFs  Intractable nausea and vomiting: continue on IVFs. Zofran prn   Possible UTI: UA is positive, urine cx is pending. Continue on IV rocephin  Depression: severity unknown. Continue on home dose of cymbalta  Peripheral neuropathy: will continue on gabapentin   DVT prophylaxis: SCDs Code Status: full Family Communication:  Disposition Plan: depends on PT/OT recs. Pt was living w/ some family but pt is unsure if she can go back there or not. D/c barriers, possible unsafe d/c plan  Status is: Inpatient  Remains inpatient appropriate because:Ongoing diagnostic testing needed not appropriate for outpatient work up   Dispo: The patient is from: Home              Anticipated d/c is to: SNF vs home health vs home              Anticipated d/c date is: 2 days              Patient currently is not medically stable to d/c.      Consultants:   GI   Procedures:    Antimicrobials: rocephin   Subjective: Pt c/o nausea.   Objective: Vitals:   11/23/19 2130 11/23/19 2200 11/24/19 0159 11/24/19 0432  BP: (!) 143/82 138/88 (!) 149/71 127/71  Pulse: (!) 139 (!) 127 (!) 119 (!) 122  Resp: (!) 24 17 17 14   Temp:      TempSrc:      SpO2: 97% 94% 97% 96%  Weight:       Height:        Intake/Output Summary (Last 24 hours) at 11/24/2019 0801 Last data filed at 11/23/2019 2048 Gross per 24 hour  Intake 2000 ml  Output --  Net 2000 ml   Filed Weights   11/23/19 1423  Weight: 55.8 kg    Examination:  General exam: Appears calm and comfortable. Appears older than stated age  Respiratory system: Clear to auscultation. Respiratory effort normal. Cardiovascular system: S1 & S2 +. No  rubs, gallops or clicks.  Gastrointestinal system: Abdomen is nondistended, soft and tenderness to palpation. Normal bowel sounds heard. Central nervous system: Alert and oriented.  Psychiatry: Judgement and insight appear normal. Flat mood and affect.     Data Reviewed: I have personally reviewed following labs and imaging studies  CBC: Recent Labs  Lab 11/23/19 1437 11/23/19 2300 11/24/19 0505  WBC 13.3* 7.4 5.4  HGB 10.8* 9.5* 8.8*  HCT 32.9* 27.4* 26.9*  MCV 81.4 79.0* 83.3  PLT 107* 63* 57*   Basic Metabolic Panel: Recent Labs  Lab 11/23/19 1437 11/24/19 0505  NA 131* 132*  K 3.6 2.9*  CL 93* 101  CO2 16* 17*  GLUCOSE 90 85  BUN 12 7  CREATININE 0.60 0.59  CALCIUM 8.6* 7.4*   GFR: Estimated Creatinine Clearance:  71.7 mL/min (by C-G formula based on SCr of 0.59 mg/dL). Liver Function Tests: Recent Labs  Lab 11/23/19 1437  AST 161*  ALT 49*  ALKPHOS 86  BILITOT 3.7*  PROT 7.9  ALBUMIN 3.5   Recent Labs  Lab 11/23/19 1437  LIPASE 37   No results for input(s): AMMONIA in the last 168 hours. Coagulation Profile: Recent Labs  Lab 11/23/19 1620 11/24/19 0505  INR 1.6* 1.8*   Cardiac Enzymes: No results for input(s): CKTOTAL, CKMB, CKMBINDEX, TROPONINI in the last 168 hours. BNP (last 3 results) No results for input(s): PROBNP in the last 8760 hours. HbA1C: No results for input(s): HGBA1C in the last 72 hours. CBG: No results for input(s): GLUCAP in the last 168 hours. Lipid Profile: No results for input(s): CHOL, HDL,  LDLCALC, TRIG, CHOLHDL, LDLDIRECT in the last 72 hours. Thyroid Function Tests: No results for input(s): TSH, T4TOTAL, FREET4, T3FREE, THYROIDAB in the last 72 hours. Anemia Panel: No results for input(s): VITAMINB12, FOLATE, FERRITIN, TIBC, IRON, RETICCTPCT in the last 72 hours. Sepsis Labs: Recent Labs  Lab 11/23/19 2300 11/24/19 0341 11/24/19 0505  PROCALCITON  --   --  <0.10  LATICACIDVEN 2.1* 0.7  --     Recent Results (from the past 240 hour(s))  Gram stain     Status: None   Collection Time: 11/23/19  8:50 PM   Specimen: KNEE; Body Fluid  Result Value Ref Range Status   Specimen Description KNEE  Final   Special Requests NONE  Final   Gram Stain   Final    WBC SEEN RED BLOOD CELLS PRESENT NO ORGANISMS SEEN Performed at [facility], [address] [place], [place] [address]    Report Status 11/23/2019 FINAL  Final  Blood culture (routine x 2)     Status: None (Preliminary result)   Collection Time: 11/23/19 11:00 PM   Specimen: BLOOD  Result Value Ref Range Status   Specimen Description BLOOD BLOOD RIGHT HAND  Final   Special Requests   Final    BOTTLES DRAWN AEROBIC ONLY Blood Culture adequate volume   Culture   Final    NO GROWTH < 12 HOURS Performed at [facility], [address] [place] [address]    Report Status PENDING  Incomplete  Blood culture (routine x 2)     Status: None (Preliminary result)   Collection Time: 11/23/19 11:00 PM   Specimen: BLOOD  Result Value Ref Range Status   Specimen Description BLOOD BLOOD LEFT HAND  Final   Special Requests   Final    BOTTLES DRAWN AEROBIC AND ANAEROBIC Blood Culture adequate volume   Culture   Final    NO GROWTH < 12 HOURS Performed at [facility], [address], [place],  [address]    Report Status PENDING  Incomplete  SARS Coronavirus 2 by RT PCR (hospital order, performed in [place] hospital lab) Nasopharyngeal Nasopharyngeal Swab     Status: None    Collection Time: 11/24/19  3:30 AM   Specimen: Nasopharyngeal Swab  Result Value Ref Range Status   SARS Coronavirus 2 NEGATIVE NEGATIVE Final    Comment: (NOTE) SARS-CoV-2 target nucleic acids are NOT DETECTED.  The SARS-CoV-2 RNA is generally detectable in upper and lower respiratory specimens during the acute phase of infection. The lowest concentration of SARS-CoV-2 viral copies this assay can detect is 250 copies / mL. A negative result does not preclude SARS-CoV-2 infection and should not be used  as the sole basis for treatment or other patient management decisions.  A negative result may occur with improper specimen collection / handling, submission of specimen other than nasopharyngeal swab, presence of viral mutation(s) within the areas targeted by this assay, and inadequate number of viral copies (<250 copies / mL). A negative result must be combined with clinical observations, patient history, and epidemiological information.  Fact Sheet for Patients:   StrictlyIdeas.no  Fact Sheet for Healthcare Providers: BankingDealers.co.za  This test is not yet approved or  cleared by the [place] FDA and has been authorized for detection and/or diagnosis of SARS-CoV-2 by FDA under an Emergency Use Authorization (EUA).  This EUA will remain in effect (meaning this test can be used) for the duration of the COVID-19 declaration under Section 564(b)(1) of the Act, 21 U.S.C. section 360bbb-3(b)(1), unless the authorization is terminated or revoked sooner.  Performed at [facility], [address] [place], [place] [address]          Radiology Studies: CT ABDOMEN PELVIS W CONTRAST  Result Date: 11/23/2019 CLINICAL DATA:  Abdominal pain, nausea and vomiting EXAM: CT ABDOMEN AND PELVIS WITH CONTRAST TECHNIQUE: Multidetector CT imaging of the abdomen and pelvis was performed using the standard protocol following bolus  administration of intravenous contrast. CONTRAST:  69mL OMNIPAQUE IOHEXOL 300 MG/ML  SOLN COMPARISON:  Lung bases are clear. Normal heart size. No pericardial effusion. FINDINGS: Lower chest: Atelectatic changes in the otherwise clear lung bases. Cardiac size within normal limits. No pericardial effusion. Hepatobiliary: Diffusely decreased hepatic attenuation with some heterogeneous enhancement. May reflect a degree of hepatic steatosis with underlying cirrhotic change and regenerative nodules. No focal discernible lesions. Nodular liver surface contour. Slight left lobe hypertrophy and prominence of the caudate lobe further suggestive of cirrhotic change. Recanalization of the umbilical vein. Mild distension of the gallbladder with layering attenuation and a calcified gallstone. No gallbladder wall thickening is seen however. No focal pericholecystic inflammation or biliary ductal dilatation. No intraductal gallstones. Pancreas: Insert focal inflammatory changes which are seen about the distal pancreatic tail with some mild edematous thickening. Uniform pancreatic enhancement without evidence of necrosis. No discernible organized collection or abscess. No pancreatic ductal dilatation. Spleen: Borderline splenomegaly.  No focal splenic lesion. Adrenals/Urinary Tract: No concerning adrenal nodules. Kidneys enhance and excrete symmetrically. No worrisome renal lesions. No urolithiasis or hydronephrosis. Urinary bladder is physiologically distended and otherwise unremarkable. Stomach/Bowel: Distal esophagus is unremarkable. Vascular collaterals about the stomach. Diffuse edematous changes are seen in of the proximal gastric mucosa. Few hypodense foci (5/37) could reflect site of gastric ulceration. Linear metallic density may reflect endoscopy clip. Mild nonspecific thickening of the duodenum. No other small bowel thickening or dilatation. There is diffuse edematous mural thickening throughout the colon most pronounced  in the cecum and descending segments. A normal appendix is visualized. Vascular/Lymphatic: Extensive upper abdominal venous collaterals including numerous gastric collaterals and likely varices. Recanalization of the umbilical vein as above. Portal vein, splenic vein and SMV are patent. Hepatic vein is grossly patent. Atherosclerotic plaque within the normal caliber aorta. Additional calcification of the branch vessels. Some edematous appearing central mesenteric nodes. No pathologically enlarged adenopathy. Reproductive: Radiopaque IUD in expected position within the endometrial canal. No concerning adnexal lesions. Other: Small volume ascites in the abdomen predominantly contained within the subphrenic space on the right with more mild diffuse central mesenteric edema. There is some focal inflammation centered upon the tail of the pancreas, within the lesser sac and extending into the left  pericolic gutter. No abdominopelvic free air. Circumferential body wall edema. Musculoskeletal: Stable appearance of compression deformities at the T10, L1 and L3 levels. Levocurvature of the spine is likely related to patient positioning. Ill-defined sclerosis in the right posterior sacrum may reflect site of bone graft harvest or marrow sampling. No new acute or worrisome osseous lesions. IMPRESSION: 1. Diffuse edematous changes in the proximal gastric mucosa with a focal site of mural hypoattenuation in some adjacent inflammation which could reflect additional site of ulceration separate from a endoscopic clip along the greater curvature. Correlate with patient's symptoms and consider further evaluation with repeat endoscopy as clinically indicated. 2. Cirrhosis with sequelae of portal hypertension including heterogeneous, nodular liver, recanalized umbilical vein, numerous upper abdominal venous collaterals including numerous gastric collaterals and likely varices, splenomegaly and ascites. 3. Mild edematous mural thickening  of the duodenum and colon is nonspecific in the setting of cirrhosis and ascites. Correlate for abdominal symptoms of acute gastritis/colitis versus features of portal enteropathy. 4. Question some mild edematous thickening at the tail of the pancreas with some adjacent fluid in the lesser sac, while this could reflect a pancreatitis distributed inflammation from the gastric process above or ascitic fluid could mimic this appearance. Correlate with lipase. 5. Cholelithiasis without evidence of acute cholecystitis. If there is clinical concern for acute cholecystitis, consider further evaluation with right upper quadrant ultrasound. 6. Stable appearance of compression deformities at the T10, L1 and L3 levels. Ill-defined sclerosis in the right posterior sacrum may reflect site of bone graft harvest or marrow sampling. 7. Aortic Atherosclerosis (ICD10-I70.0). Electronically Signed   By: Lovena Le M.D.   On: 11/23/2019 23:36   DG Knee Complete 4 Views Right  Result Date: 11/23/2019 CLINICAL DATA:  Knee pain, fall EXAM: RIGHT KNEE - COMPLETE 4+ VIEW COMPARISON:  None. FINDINGS: No fracture or dislocation. Large amount of soft tissue swelling medially and posteriorly. Moderate to large knee effusion. Tricompartment arthritis, advanced involving the lateral joint space. IMPRESSION: Prominent soft tissue swelling with moderate to large knee effusion. No definitive fracture. Arthritis. Electronically Signed   By: Donavan Foil M.D.   On: 11/23/2019 19:20        Scheduled Meds:  DULoxetine  20 mg Oral Daily   ferrous sulfate  325 mg Oral BID   folic acid  1 mg Oral Daily   multivitamin with minerals  1 tablet Oral Daily   [START ON 11/27/2019] pantoprazole  40 mg Intravenous Q12H   sodium chloride flush  3 mL Intravenous Once   sucralfate  1 g Oral TID WC & HS   thiamine  100 mg Oral Daily   vitamin B-12  250 mcg Oral Daily   Continuous Infusions:  sodium chloride 100 mL/hr at 11/24/19 0346    cefTRIAXone (ROCEPHIN)  IV     pantoprozole (PROTONIX) infusion 8 mg/hr (11/24/19 0315)   potassium chloride       LOS: 0 days    Time spent: 33 mins     Wyvonnia Dusky, MD [facility] Pager [phone]  If 7PM-7AM, please contact night-coverage [email or web address] 11/24/2019, 8:01 AM

## 2019-11-24 NOTE — Progress Notes (Signed)
This note also relates to the following rows which could not be included: ECG Heart Rate - Cannot attach notes to unvalidated device data Resp - Cannot attach notes to unvalidated device data    11/24/19 1900  Assess: if the MEWS score is Yellow or Red  Were vital signs taken at a resting state? Yes  Focused Assessment Documented focused assessment  Early Detection of Sepsis Score *See Row Information* Low  MEWS guidelines implemented *See Row Information* No, previously yellow, continue vital signs every 4 hours

## 2019-11-24 NOTE — Telephone Encounter (Signed)
Called pt, phone still goes straight to VM that's not set up. Will mail letter for pt to contact the office.
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
## 2019-11-24 NOTE — Chronic Care Management (AMB) (Signed)
  Care Management   Note  11/24/2019 Name: Rhonda Martin MRN: [id number] DOB: [birth year]  Rhonda Martin is a 44 y.o. year old female who is a primary care patient of Volney American, Hershal Coria and is actively engaged with the care management team. I reached out to [name] by phone today to assist with re-scheduling an initial visit with the RN Case Manager  Follow up plan: Second Unsuccessful telephone outreach attempt made.  The care management team will reach out to the patient again over the next 7 days.   Noreene Larsson, [place], [place], [place] [address] Direct Dial: [phone] [email or web address]

## 2019-11-24 NOTE — Progress Notes (Signed)
   11/24/19 1807  Assess: MEWS Score  Temp 99.2 F (37.3 C)  BP 125/75  Pulse Rate (!) 125  Resp 18  SpO2 100 %  O2 Device Room Air  Assess: MEWS Score  MEWS Temp 0  MEWS Systolic 0  MEWS Pulse 2  MEWS RR 0  MEWS LOC 0  MEWS Score 2  MEWS Score Color Yellow  Assess: if the MEWS score is Yellow or Red  Were vital signs taken at a resting state? Yes  Focused Assessment Documented focused assessment  Early Detection of Sepsis Score *See Row Information* Low  MEWS guidelines implemented *See Row Information* No, previously yellow, continue vital signs every 4 hours  Treat  MEWS Interventions Administered scheduled meds/treatments  Take Vital Signs  Increase Vital Sign Frequency  Yellow: Q 2hr X 2 then Q 4hr X 2, if remains yellow, continue Q 4hrs  Notify: Charge Nurse/RN  Name of Charge Nurse/RN Notified Georgina Quint RN  Date Charge Nurse/RN Notified 11/24/19  Time Charge Nurse/RN Notified 2446  Notify: Provider  Provider Name/Title Williams  Date Provider Notified 11/24/19  Time Provider Notified 2863  Response No new orders  Date of Provider Response 11/24/19  Time of Provider Response 1814  Document  Patient Outcome Other (Comment) (will continue to monitor)  Progress note created (see row info) Yes

## 2019-11-25 LAB — URINE CULTURE

## 2019-11-25 LAB — CBC
HCT: 24.3 % — ABNORMAL LOW (ref 36.0–46.0)
Hemoglobin: 8.5 g/dL — ABNORMAL LOW (ref 12.0–15.0)
MCH: 27.5 pg (ref 26.0–34.0)
MCHC: 35 g/dL (ref 30.0–36.0)
MCV: 78.6 fL — ABNORMAL LOW (ref 80.0–100.0)
Platelets: 54 10*3/uL — ABNORMAL LOW (ref 150–400)
RBC: 3.09 MIL/uL — ABNORMAL LOW (ref 3.87–5.11)
RDW: 17.4 % — ABNORMAL HIGH (ref 11.5–15.5)
WBC: 3.5 10*3/uL — ABNORMAL LOW (ref 4.0–10.5)
nRBC: 0 % (ref 0.0–0.2)

## 2019-11-25 LAB — COMPREHENSIVE METABOLIC PANEL
ALT: 42 U/L (ref 0–44)
AST: 117 U/L — ABNORMAL HIGH (ref 15–41)
Albumin: 2.6 g/dL — ABNORMAL LOW (ref 3.5–5.0)
Alkaline Phosphatase: 70 U/L (ref 38–126)
Anion gap: 8 (ref 5–15)
BUN: <5 mg/dL — ABNORMAL LOW (ref 6–20)
CO2: 24 mmol/L (ref 22–32)
Calcium: 7.6 mg/dL — ABNORMAL LOW (ref 8.9–10.3)
Chloride: 103 mmol/L (ref 98–111)
Creatinine, Ser: 0.41 mg/dL — ABNORMAL LOW (ref 0.44–1.00)
GFR calc Af Amer: >60 mL/min (ref >60)
GFR calc non Af Amer: >60 mL/min (ref >60)
Glucose, Bld: 112 mg/dL — ABNORMAL HIGH (ref 70–99)
Potassium: 2.6 mmol/L — CL (ref 3.5–5.1)
Sodium: 135 mmol/L (ref 135–145)
Total Bilirubin: 3.1 mg/dL — ABNORMAL HIGH (ref 0.3–1.2)
Total Protein: 5.5 g/dL — ABNORMAL LOW (ref 6.5–8.1)

## 2019-11-25 LAB — GLUCOSE, CAPILLARY: Glucose-Capillary: 104 mg/dL — ABNORMAL HIGH (ref 70–99)

## 2019-11-25 LAB — MRSA PCR SCREENING: MRSA by PCR: NEGATIVE

## 2019-11-25 MED ORDER — POTASSIUM & SODIUM PHOSPHATES 280-160-250 MG PO PACK
1.0000 | PACK | Freq: Three times a day (TID) | ORAL | Status: DC
  Filled 2019-11-25 (×3): qty 1

## 2019-11-25 MED ORDER — DEXMEDETOMIDINE HCL IN NACL 200 MCG/50ML IV SOLN: 0.4000 ug/kg/h | INTRAVENOUS | Status: DC

## 2019-11-25 MED ORDER — SODIUM PHOSPHATES 45 MMOLE/15ML IV SOLN
30.0000 mmol | Freq: Once | INTRAVENOUS | Status: AC
  Administered 2019-11-25: 30 mmol via INTRAVENOUS
  Filled 2019-11-25: qty 10

## 2019-11-25 MED ORDER — POTASSIUM CHLORIDE 10 MEQ/100ML IV SOLN
10.0000 meq | INTRAVENOUS | Status: AC
  Administered 2019-11-25 (×6): 10 meq via INTRAVENOUS
  Filled 2019-11-25 (×6): qty 100

## 2019-11-25 MED ORDER — DEXMEDETOMIDINE HCL IN NACL 400 MCG/100ML IV SOLN
0.4000 ug/kg/h | INTRAVENOUS | Status: DC
  Administered 2019-11-25: 0.4 ug/kg/h via INTRAVENOUS
  Administered 2019-11-25 – 2019-11-26 (×3): 0.6 ug/kg/h via INTRAVENOUS
  Administered 2019-11-27: 0.9 ug/kg/h via INTRAVENOUS
  Administered 2019-11-27: 0.7 ug/kg/h via INTRAVENOUS
  Administered 2019-11-27: 1.2 ug/kg/h via INTRAVENOUS
  Administered 2019-11-28: 1 ug/kg/h via INTRAVENOUS
  Administered 2019-11-28 (×3): 1.2 ug/kg/h via INTRAVENOUS
  Administered 2019-11-29: 1 ug/kg/h via INTRAVENOUS
  Filled 2019-11-25 (×12): qty 100

## 2019-11-25 MED ORDER — MAGNESIUM SULFATE 2 GM/50ML IV SOLN
2.0000 g | Freq: Once | INTRAVENOUS | Status: AC
  Administered 2019-11-25: 2 g via INTRAVENOUS
  Filled 2019-11-25: qty 50

## 2019-11-25 MED ORDER — METOPROLOL TARTRATE 5 MG/5ML IV SOLN
5.0000 mg | Freq: Once | INTRAVENOUS | Status: AC
  Administered 2019-11-25: 5 mg via INTRAVENOUS
  Filled 2019-11-25: qty 5

## 2019-11-25 MED ORDER — CHLORHEXIDINE GLUCONATE CLOTH 2 % EX PADS
6.0000 | MEDICATED_PAD | Freq: Every day | CUTANEOUS | Status: DC
  Administered 2019-11-25 – 2019-12-03 (×8): 6 via TOPICAL

## 2019-11-25 NOTE — Progress Notes (Signed)
PROGRESS NOTE    Rhonda Martin  FOY:[id number] DOB: [birth year] DOA: 11/23/2019 PCP: Volney American, PA-C   Assessment & Plan:   Active Problems:   GI bleeding  Alcohol abuse & withdrawal: alcohol cessation counseling. Continue on CIWA protocol. Transferred to stepdown and started on precedex drip as ativan was not enough   Hx of GI bleeding: as per GI. 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 Will continue to monitor H&H   Hx of narcotic abuse: as per pt's husband. Pt will take a 30 day supply of narcotics and take them in 10 days. 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 Will continue to monitor H&H & platelets   Hypokalemia: KCl repleted again. Will continue to monitor  Hypomagnesemia: mg sulfate ordered. Will continue to monitor  Hypophosphatemia: phosphate repleted. Will continue to monitor   Hyponatremia: resolved  Intractable nausea and vomiting: continue on IVFs. Zofran prn   Possible UTI: UA is positive, urine cx is pending. Continue on IV rocephin  Depression: severity unknown. Continue on home dose of cymbalta  Peripheral neuropathy: will continue on gabapentin   DVT prophylaxis: SCDs Code Status: full Family Communication: discussed pt's care w/ pt's husband, Tracey Harries [phone]) at bedside and answered his questions  Disposition Plan: depends on PT/OT recs.   Status is: Inpatient  Remains inpatient appropriate because:Ongoing diagnostic testing needed not appropriate for outpatient work up   Dispo: The patient is from: Home              Anticipated d/c is to: SNF vs home health vs home              Anticipated d/c date is: >3 days              Patient currently is not  medically stable to d/c.      Consultants:   GI   Procedures:    Antimicrobials: rocephin   Subjective: Pt c/o obtunded. Pt is unable to answer questions appropriately  Objective: Vitals:   11/25/19 0700 11/25/19 0706 11/25/19 0750 11/25/19 0820  BP: 112/67 112/67 125/76 126/83  Pulse: (!) 116 (!) 112 (!) 116 (!) 110  Resp: 18 19 19 19   Temp:  98.5 F (36.9 C)  99.3 F (37.4 C)  TempSrc:  Axillary  Oral  SpO2:  95% 94% 98%  Weight:    55.5 kg  Height:    5\' 2"  (1.575 m)    Intake/Output Summary (Last 24 hours) at 11/25/2019 0843 Last data filed at 11/25/2019 0751 Gross per 24 hour  Intake 2994.39 ml  Output 3350 ml  Net -355.61 ml   Filed Weights   11/24/19 1805 11/25/19 0534 11/25/19 0820  Weight: 54.2 kg 55.7 kg 55.5 kg    Examination:  General exam: Appears calm and comfortable. Appears older than stated age  Respiratory system: diminished breath sounds b/l. Cardiovascular system: S1 & S2 +. No  rubs, gallops or clicks.  Gastrointestinal system: Abdomen is nondistended, soft and tenderness to palpation. hypoactive bowel sounds heard. Central nervous system: Obtunded  Psychiatry: Judgement and insight appear abnormal.     Data Reviewed: I have personally reviewed following labs and imaging studies  CBC: Recent Labs  Lab  11/23/19 1437 11/23/19 2300 11/24/19 0505  WBC 13.3* 7.4 5.4  HGB 10.8* 9.5* 8.8*  HCT 32.9* 27.4* 26.9*  MCV 81.4 79.0* 83.3  PLT 107* 63* 57*   Basic Metabolic Panel: Recent Labs  Lab 11/23/19 1437 11/24/19 0505 11/24/19 1731  NA 131* 132*  --   K 3.6 2.9*  --   CL 93* 101  --   CO2 16* 17*  --   GLUCOSE 90 85  --   BUN 12 7  --   CREATININE 0.60 0.59  --   CALCIUM 8.6* 7.4*  --   MG  --   --  1.4*  PHOS  --   --  1.1*   GFR: Estimated Creatinine Clearance: 71.7 mL/min (by C-G formula based on SCr of 0.59 mg/dL). Liver Function Tests: Recent Labs  Lab 11/23/19 1437  AST 161*  ALT 49*  ALKPHOS 86    BILITOT 3.7*  PROT 7.9  ALBUMIN 3.5   Recent Labs  Lab 11/23/19 1437  LIPASE 37   No results for input(s): AMMONIA in the last 168 hours. Coagulation Profile: Recent Labs  Lab 11/23/19 1620 11/24/19 0505  INR 1.6* 1.8*   Cardiac Enzymes: No results for input(s): CKTOTAL, CKMB, CKMBINDEX, TROPONINI in the last 168 hours. BNP (last 3 results) No results for input(s): PROBNP in the last 8760 hours. HbA1C: No results for input(s): HGBA1C in the last 72 hours. CBG: Recent Labs  Lab 11/25/19 0821  GLUCAP 104*   Lipid Profile: No results for input(s): CHOL, HDL, LDLCALC, TRIG, CHOLHDL, LDLDIRECT in the last 72 hours. Thyroid Function Tests: No results for input(s): TSH, T4TOTAL, FREET4, T3FREE, THYROIDAB in the last 72 hours. Anemia Panel: No results for input(s): VITAMINB12, FOLATE, FERRITIN, TIBC, IRON, RETICCTPCT in the last 72 hours. Sepsis Labs: Recent Labs  Lab 11/23/19 2300 11/24/19 0341 11/24/19 0505  PROCALCITON  --   --  <0.10  LATICACIDVEN 2.1* 0.7  --     Recent Results (from the past 240 hour(s))  Body fluid culture     Status: None (Preliminary result)   Collection Time: 11/23/19  8:50 PM   Specimen: KNEE; Body Fluid  Result Value Ref Range Status   Specimen Description   Final    KNEE RIGHT JOINT Performed at [facility], [address] [place], [place] [address]    Special Requests   Final    NONE Performed at [facility], [address] [place], [place] [address]    Gram Stain   Final    MODERATE WBC PRESENT,BOTH PMN AND MONONUCLEAR NO ORGANISMS SEEN Performed at [facility], [place] [address], [place], [place]  [address]    Culture PENDING  Incomplete   Report Status PENDING  Incomplete  Gram stain     Status: None   Collection Time: 11/23/19  8:50 PM   Specimen: KNEE; Body Fluid  Result Value Ref Range Status   Specimen Description KNEE  Final   Special Requests NONE  Final   Gram Stain   Final    WBC  SEEN RED BLOOD CELLS PRESENT NO ORGANISMS SEEN Performed at [facility], [address], [place], [place] [address]    Report Status 11/23/2019 FINAL  Final  Blood culture (routine x 2)     Status: None (Preliminary result)   Collection Time: 11/23/19 11:00 PM   Specimen: BLOOD  Result Value Ref Range Status   Specimen Description BLOOD BLOOD RIGHT HAND  Final  Special Requests   Final    BOTTLES DRAWN AEROBIC ONLY Blood Culture adequate volume   Culture   Final    NO GROWTH 2 DAYS Performed at [facility], [address] [place], [place] [address]    Report Status PENDING  Incomplete  Blood culture (routine x 2)     Status: None (Preliminary result)   Collection Time: 11/23/19 11:00 PM   Specimen: BLOOD  Result Value Ref Range Status   Specimen Description BLOOD BLOOD LEFT HAND  Final   Special Requests   Final    BOTTLES DRAWN AEROBIC AND ANAEROBIC Blood Culture adequate volume   Culture   Final    NO GROWTH 2 DAYS Performed at [facility], [address], [place], [place] [address]    Report Status PENDING  Incomplete  SARS Coronavirus 2 by RT PCR (hospital order, performed in [place] hospital lab) Nasopharyngeal Nasopharyngeal Swab     Status: None   Collection Time: 11/24/19  3:30 AM   Specimen: Nasopharyngeal Swab  Result Value Ref Range Status   SARS Coronavirus 2 NEGATIVE NEGATIVE Final    Comment: (NOTE) SARS-CoV-2 target nucleic acids are NOT DETECTED.  The SARS-CoV-2 RNA is generally detectable in upper and lower respiratory specimens during the acute phase of infection. The lowest concentration of SARS-CoV-2 viral copies this assay can detect is 250 copies / mL. A negative result does not preclude SARS-CoV-2 infection and should not be used as the sole basis for treatment or other patient management decisions.  A negative result may occur with improper specimen collection / handling, submission of specimen other than  nasopharyngeal swab, presence of viral mutation(s) within the areas targeted by this assay, and inadequate number of viral copies (<250 copies / mL). A negative result must be combined with clinical observations, patient history, and epidemiological information.  Fact Sheet for Patients:   StrictlyIdeas.no  Fact Sheet for Healthcare Providers: BankingDealers.co.za  This test is not yet approved or  cleared by the [place] FDA and has been authorized for detection and/or diagnosis of SARS-CoV-2 by FDA under an Emergency Use Authorization (EUA).  This EUA will remain in effect (meaning this test can be used) for the duration of the COVID-19 declaration under Section 564(b)(1) of the Act, 21 U.S.C. section 360bbb-3(b)(1), unless the authorization is terminated or revoked sooner.  Performed at [facility], [address] [place], [place] [address]          Radiology Studies: CT ABDOMEN PELVIS W CONTRAST  Result Date: 11/23/2019 CLINICAL DATA:  Abdominal pain, nausea and vomiting EXAM: CT ABDOMEN AND PELVIS WITH CONTRAST TECHNIQUE: Multidetector CT imaging of the abdomen and pelvis was performed using the standard protocol following bolus administration of intravenous contrast. CONTRAST:  59mL OMNIPAQUE IOHEXOL 300 MG/ML  SOLN COMPARISON:  Lung bases are clear. Normal heart size. No pericardial effusion. FINDINGS: Lower chest: Atelectatic changes in the otherwise clear lung bases. Cardiac size within normal limits. No pericardial effusion. Hepatobiliary: Diffusely decreased hepatic attenuation with some heterogeneous enhancement. May reflect a degree of hepatic steatosis with underlying cirrhotic change and regenerative nodules. No focal discernible lesions. Nodular liver surface contour. Slight left lobe hypertrophy and prominence of the caudate lobe further suggestive of cirrhotic change. Recanalization of the umbilical vein.  Mild distension of the gallbladder with layering attenuation and a calcified gallstone. No gallbladder wall thickening is seen however. No focal pericholecystic inflammation or biliary ductal dilatation. No intraductal gallstones. Pancreas: Insert focal inflammatory  changes which are seen about the distal pancreatic tail with some mild edematous thickening. Uniform pancreatic enhancement without evidence of necrosis. No discernible organized collection or abscess. No pancreatic ductal dilatation. Spleen: Borderline splenomegaly.  No focal splenic lesion. Adrenals/Urinary Tract: No concerning adrenal nodules. Kidneys enhance and excrete symmetrically. No worrisome renal lesions. No urolithiasis or hydronephrosis. Urinary bladder is physiologically distended and otherwise unremarkable. Stomach/Bowel: Distal esophagus is unremarkable. Vascular collaterals about the stomach. Diffuse edematous changes are seen in of the proximal gastric mucosa. Few hypodense foci (5/37) could reflect site of gastric ulceration. Linear metallic density may reflect endoscopy clip. Mild nonspecific thickening of the duodenum. No other small bowel thickening or dilatation. There is diffuse edematous mural thickening throughout the colon most pronounced in the cecum and descending segments. A normal appendix is visualized. Vascular/Lymphatic: Extensive upper abdominal venous collaterals including numerous gastric collaterals and likely varices. Recanalization of the umbilical vein as above. Portal vein, splenic vein and SMV are patent. Hepatic vein is grossly patent. Atherosclerotic plaque within the normal caliber aorta. Additional calcification of the branch vessels. Some edematous appearing central mesenteric nodes. No pathologically enlarged adenopathy. Reproductive: Radiopaque IUD in expected position within the endometrial canal. No concerning adnexal lesions. Other: Small volume ascites in the abdomen predominantly contained within the  subphrenic space on the right with more mild diffuse central mesenteric edema. There is some focal inflammation centered upon the tail of the pancreas, within the lesser sac and extending into the left pericolic gutter. No abdominopelvic free air. Circumferential body wall edema. Musculoskeletal: Stable appearance of compression deformities at the T10, L1 and L3 levels. Levocurvature of the spine is likely related to patient positioning. Ill-defined sclerosis in the right posterior sacrum may reflect site of bone graft harvest or marrow sampling. No new acute or worrisome osseous lesions. IMPRESSION: 1. Diffuse edematous changes in the proximal gastric mucosa with a focal site of mural hypoattenuation in some adjacent inflammation which could reflect additional site of ulceration separate from a endoscopic clip along the greater curvature. Correlate with patient's symptoms and consider further evaluation with repeat endoscopy as clinically indicated. 2. Cirrhosis with sequelae of portal hypertension including heterogeneous, nodular liver, recanalized umbilical vein, numerous upper abdominal venous collaterals including numerous gastric collaterals and likely varices, splenomegaly and ascites. 3. Mild edematous mural thickening of the duodenum and colon is nonspecific in the setting of cirrhosis and ascites. Correlate for abdominal symptoms of acute gastritis/colitis versus features of portal enteropathy. 4. Question some mild edematous thickening at the tail of the pancreas with some adjacent fluid in the lesser sac, while this could reflect a pancreatitis distributed inflammation from the gastric process above or ascitic fluid could mimic this appearance. Correlate with lipase. 5. Cholelithiasis without evidence of acute cholecystitis. If there is clinical concern for acute cholecystitis, consider further evaluation with right upper quadrant ultrasound. 6. Stable appearance of compression deformities at the T10, L1  and L3 levels. Ill-defined sclerosis in the right posterior sacrum may reflect site of bone graft harvest or marrow sampling. 7. Aortic Atherosclerosis (ICD10-I70.0). Electronically Signed   By: Lovena Le M.D.   On: 11/23/2019 23:36   DG Knee Complete 4 Views Right  Result Date: 11/23/2019 CLINICAL DATA:  Knee pain, fall EXAM: RIGHT KNEE - COMPLETE 4+ VIEW COMPARISON:  None. FINDINGS: No fracture or dislocation. Large amount of soft tissue swelling medially and posteriorly. Moderate to large knee effusion. Tricompartment arthritis, advanced involving the lateral joint space. IMPRESSION: Prominent soft tissue swelling with moderate to  large knee effusion. No definitive fracture. Arthritis. Electronically Signed   By: Donavan Foil M.D.   On: 11/23/2019 19:20        Scheduled Meds: . Chlorhexidine Gluconate Cloth  6 each Topical Daily  . DULoxetine  20 mg Oral Daily  . folic acid  1 mg Oral Daily  . LORazepam  0-4 mg Intravenous Q4H   Followed by  . [START ON 11/26/2019] LORazepam  0-4 mg Intravenous Q8H  . multivitamin with minerals  1 tablet Oral Daily  . pantoprazole  40 mg Oral Daily  . potassium & sodium phosphates  1 packet Oral TID WC & HS  . sodium chloride flush  3 mL Intravenous Once  . sucralfate  1 g Oral TID WC & HS  . thiamine  100 mg Oral Daily  . vitamin B-12  250 mcg Oral Daily   Continuous Infusions: . sodium chloride 100 mL/hr at 11/25/19 0020  . cefTRIAXone (ROCEPHIN)  IV Stopped (11/24/19 1024)  . dexmedetomidine (PRECEDEX) IV infusion 0.4 mcg/kg/hr (11/25/19 0826)  . magnesium sulfate bolus IVPB       LOS: 1 day    Time spent: 30 mins     Wyvonnia Dusky, MD [facility] Pager [phone]  If 7PM-7AM, please contact night-coverage [email or web address] 11/25/2019, 8:43 AM

## 2019-11-25 NOTE — Progress Notes (Addendum)
Pt HR sustaining at 130's to 140's. Talked to NP Randol Kern and ordered 5 mg IV metropolol/  Uopdate 0230: Pt is not following command despite ativan PRN. Notify B Randol Kern and ordered a 1 on 1 sitter. Will continue to monitor.

## 2019-11-25 NOTE — Progress Notes (Signed)
Report given to [name], RN receiving patient in ICU. Patient transferred to ICU 4 to be placed on precedex drip.

## 2019-11-25 NOTE — Progress Notes (Signed)
   11/25/19 0532  Assess: MEWS Score  BP 122/69  Pulse Rate (!) 125  ECG Heart Rate (!) 0  Resp 20  SpO2 94 %  O2 Device Room Air  Assess: MEWS Score  MEWS Temp 0  MEWS Systolic 0  MEWS Pulse 2  MEWS RR 0  MEWS LOC 0  MEWS Score 2  MEWS Score Color Yellow  Assess: if the MEWS score is Yellow or Red  Were vital signs taken at a resting state? Yes  Focused Assessment Documented focused assessment  Early Detection of Sepsis Score *See Row Information* Low  MEWS guidelines implemented *See Row Information* No, previously yellow, continue vital signs every 4 hours  Treat  MEWS Interventions Administered prn meds/treatments  Take Vital Signs  Increase Vital Sign Frequency  Yellow: Q 2hr X 2 then Q 4hr X 2, if remains yellow, continue Q 4hrs

## 2019-11-25 NOTE — Plan of Care (Signed)
  Problem: Pain Managment: Goal: General experience of comfort will improve Outcome: Progressing   Problem: Safety: Goal: Ability to remain free from injury will improve Outcome: Progressing   

## 2019-11-25 NOTE — Consult Note (Signed)
Name: Rhonda Martin MRN: [id number] DOB: 1976/02/14    ADMISSION DATE:  11/23/2019 CONSULTATION DATE:  11/25/2019  REFERRING MD : Eppie Gibson, MD  CHIEF COMPLAINT:  acute onset of intractable N/V associated with epigastric and lower abdominal pai  BRIEF PATIENT DESCRIPTION:  44 y.o female with PMH significant for EtOH Abuse admitted with Alcohol withdrawal and related GI complications.  SIGNIFICANT EVENTS  6/18: Admitted to progressive care unit 6/18: GI consulted for questionable GI bleed 6/19: Patient went into DTs and transferred to stepdown for precedex and PCCM consulted for management.  STUDIES:  Xray right Knee 6/17: showed prominent soft tissue swelling with moderate to large knee effusion   CT abdomen and Pelvis 6/17: CT scan of the abdomen revealed cholelithiasis, cirrhosis changes, mural thickening of the stomach with retention of Hemoclip.  There was a question of gastric ulcer.  CULTURES: Body fluid culture from right Knee 6/19> pending Urine culture 6/19> multiple species present, contamination?  ANTIBIOTICS: Vancomycin> 6/18>stopped>6/19 Ceftriaxone>6/18>stopped>6/19  HISTORY OF PRESENT ILLNESS:  44 y.o female with PMH significant for EtOH Abuse with multiple admissions for withdrawal, Hep C, Liver Cirrhosis, COPD, GERD, Iron Deficiency Anemia, Cervical cancer, HTN, Thrombocytopenia and CMT who presented to the ED on 11/23/2019 with acute onset of intractable N/V associated with epigastric and lower abdominal pain. Patient also complained of right knee pain and decreased range of motion.  ED Course: On arrival to the ED,she was afebrile with blood pressure144/16mm Hg and pulse rate 128beats/min. There were no focal neurological deficits. Initial labs revealed normal Lipase, Na 131, AST 161, ALT 49. Billi 3.7, Anion gap 22, WBC 13.3, platelets 107, INR 1.6, Lactic acid 2.1 COVID -19 Negative. UA positive for UTI.  Xray of the right Knee was obtained which showed  soft tissue swelling with moderate to large knee effusion that was tapped by the ER physician. Patient received 3L bolus of IV normal saline, 1 mg of IV Ativan, 80 mg of IV Protonix, 5 mg of IV Haldol as well as IV cefepime and vancomycin then subsequently admitted under hospitalist service. Patient went int o DTs last night and was transferred to stepdown unit for Precedex gtt. PCCM consulted for management.  PAST MEDICAL HISTORY :   has a past medical history of Alcohol abuse, Anxiety, Back injury, Cervical cancer ([place]), Charcot-Marie-Tooth disease, COPD (chronic obstructive pulmonary disease) ([place]), Family history of adverse reaction to anesthesia, GERD (gastroesophageal reflux disease), Hepatitis, Hypertension, Hypokalemia, IDA (iron deficiency anemia) (06/26/2019), Iron deficiency anemia, Leg injury, Liver cirrhosis ([place]), Pneumonia, Sepsis ([place]) (07/10/2019), Symptomatic anemia (06/26/2019), and Thrombocytopenia ([place]).  has a past surgical history that includes Leg Surgery (Right); Back surgery (2015); Back surgery (2018); PICC LINE INSERTION (Right, 08/30/2019); and Esophagogastroduodenoscopy (egd) with propofol (N/A, 10/23/2019). Prior to Admission medications   Medication Sig Start Date End Date Taking? Authorizing Provider  Cyanocobalamin (VITAMIN B-12 PO) Take 1 tablet by mouth daily.   Yes [provider]  dicyclomine (BENTYL) 20 MG tablet Take 1 tablet (20 mg total) by mouth every 6 (six) hours as needed for spasms (belly cramping). 11/02/19  Yes Lorella Nimrod, MD  DULoxetine (CYMBALTA) 20 MG capsule Take 1 capsule (20 mg total) by mouth daily. 11/02/19  Yes Lorella Nimrod, MD  ferrous sulfate 325 (65 FE) MG EC tablet Take 1 tablet (325 mg total) by mouth 2 (two) times daily. 08/09/19  Yes Earlie Server, MD  folic acid (FOLVITE) 1 MG tablet Take 1 tablet (1 mg total) by mouth daily. 11/02/19  Yes Lorella Nimrod, MD  furosemide (LASIX) 20 MG tablet Take 1 tablet (20 mg total) by mouth daily.  11/02/19 11/01/20 Yes Lorella Nimrod, MD  gabapentin (NEURONTIN) 300 MG capsule Take 1 capsule (300 mg total) by mouth 3 (three) times daily as needed (pain.). 11/10/19  Yes Volney American, PA-C  hydrOXYzine (ATARAX/VISTARIL) 25 MG tablet TAKE 1 TABLET BY MOUTH THREE TIMES A DAY AS NEEDED Patient taking differently: Take 25 mg by mouth 3 (three) times daily as needed (anxiety.).  09/13/19  Yes Volney American, PA-C  midodrine (PROAMATINE) 5 MG tablet Take 1 tablet (5 mg total) by mouth 2 (two) times daily with a meal. Patient taking differently: Take 5 mg by mouth in the morning and at bedtime. Pt is taking as needed bp is good 11/02/19  Yes Lorella Nimrod, MD  Multiple Vitamin (MULTIVITAMIN WITH MINERALS) TABS tablet Take 1 tablet by mouth daily. 01/27/19  Yes Epifanio Lesches, MD  oxyCODONE (OXY IR/ROXICODONE) 5 MG immediate release tablet Take 5 mg by mouth 5 (five) times daily as needed for moderate pain or severe pain.  06/07/19  Yes [provider]  pantoprazole (PROTONIX) 40 MG tablet Take 1 tablet (40 mg total) by mouth 2 (two) times daily. 11/02/19  Yes Lorella Nimrod, MD  spironolactone (ALDACTONE) 25 MG tablet Take 1 tablet (25 mg total) by mouth daily. 08/10/19  Yes Volney American, PA-C  sucralfate (CARAFATE) 1 g tablet Take 1 tablet (1 g total) by mouth 4 (four) times daily -  with meals and at bedtime. 06/13/19  Yes Volney American, PA-C  thiamine 100 MG tablet Take 1 tablet (100 mg total) by mouth daily. 11/02/19  Yes Lorella Nimrod, MD  triamcinolone cream (KENALOG) 0.1 % Apply 1 application topically 2 (two) times daily. Patient taking differently: Apply 1 application topically 2 (two) times daily as needed (skin irritation).  03/28/19  Yes Volney American, PA-C  zolpidem (AMBIEN) 10 MG tablet Take 1 tablet (10 mg total) by mouth at bedtime as needed for sleep. 11/10/19 12/10/19 Yes Volney American, PA-C  Blood Pressure Monitor MISC For automatic  blood pressure cuff. 04/06/19   End, Harrell Gave, MD   Scheduled Meds: . Chlorhexidine Gluconate Cloth  6 each Topical Daily  . DULoxetine  20 mg Oral Daily  . folic acid  1 mg Oral Daily  . LORazepam  0-4 mg Intravenous Q4H   Followed by  . [START ON 11/26/2019] LORazepam  0-4 mg Intravenous Q8H  . multivitamin with minerals  1 tablet Oral Daily  . pantoprazole  40 mg Oral Daily  . potassium & sodium phosphates  1 packet Oral TID WC & HS  . sodium chloride flush  3 mL Intravenous Once  . sucralfate  1 g Oral TID WC & HS  . thiamine  100 mg Oral Daily  . vitamin B-12  250 mcg Oral Daily   Continuous Infusions: . sodium chloride 100 mL/hr at 11/25/19 1246  . cefTRIAXone (ROCEPHIN)  IV Stopped (11/25/19 1113)  . dexmedetomidine (PRECEDEX) IV infusion 0.4 mcg/kg/hr (11/25/19 1246)  . potassium chloride 10 mEq (11/25/19 1249)   PRN Meds:.acetaminophen **OR** acetaminophen, dicyclomine, gabapentin, hydrOXYzine, LORazepam, LORazepam **OR** LORazepam, metoprolol tartrate, midodrine, morphine injection, ondansetron **OR** ondansetron (ZOFRAN) IV, oxyCODONE, triamcinolone cream, zolpidem   No Known Allergies  FAMILY HISTORY:  family history includes Cancer in her father; Diabetes in her mother; Heart attack (age of onset: 38) in her brother; Hypertension in her brother, mother, and sister.  SOCIAL HISTORY:  reports that she has been smoking cigarettes. She has a 15.00 pack-year smoking history. She has never used smokeless tobacco. She reports current alcohol use of about 20.0 standard drinks of alcohol per week. She reports previous drug use.   REVIEW OF SYSTEMS:   Patient is sedated and altered unable to assess  PHYSICAL EXAMINATION:  VITAL SIGNS: Temp:  [98 F (36.7 C)-99.3 F (37.4 C)] 99.3 F (37.4 C) (06/19 0820) Pulse Rate:  [65-144] 101 (06/19 1200) Resp:  [15-25] 16 (06/19 1200) BP: (77-169)/(45-113) 87/47 (06/19 1200) SpO2:  [90 %-100 %] 90 % (06/19 1200) FiO2 (%):  [28  %] 28 % (06/19 0258) Weight:  [54.2 kg-55.7 kg] 55.5 kg (06/19 0820)   GENERAL:  44 year old patient lying in the bed and sedated.  EYES: Pupils equal, round, reactive to light and accommodation. No scleral icterus. Extraocular muscles intact.  HEENT: Head atraumatic, normocephalic. Oropharynx and nasopharynx clear.  NECK:  Supple, no jugular venous distention. No thyroid enlargement, no tenderness.  LUNGS: Normal breath sounds bilaterally, no wheezing, rales,rhonchi or crepitation. No use of accessory muscles of respiration.  CARDIOVASCULAR: S1, S2 normal. No murmurs, rubs, or gallops.  ABDOMEN: Soft, nontender, nondistended. Bowel sounds present. No organomegaly or mass.  EXTREMITIES: No pedal edema, cyanosis, or clubbing.  NEUROLOGIC: Cranial nerves II through XII are intact. Moves all extremities purposefully. Withdraws extremities to noxious stimuli. Sensation intact. Gait not checked.  PSYCHIATRIC: The patient is currently sedated SKIN: No obvious rash, lesion, or ulcer.    Recent Labs  Lab 11/23/19 1437 11/24/19 0505 11/25/19 1030  NA 131* 132* 135  K 3.6 2.9* 2.6*  CL 93* 101 103  CO2 16* 17* 24  BUN 12 7 <5*  CREATININE 0.60 0.59 0.41*  GLUCOSE 90 85 112*   Recent Labs  Lab 11/23/19 2300 11/24/19 0505 11/25/19 1030  HGB 9.5* 8.8* 8.5*  HCT 27.4* 26.9* 24.3*  WBC 7.4 5.4 3.5*  PLT 63* 57* 54*   CT ABDOMEN PELVIS W CONTRAST  Result Date: 11/23/2019 CLINICAL DATA:  Abdominal pain, nausea and vomiting EXAM: CT ABDOMEN AND PELVIS WITH CONTRAST TECHNIQUE: Multidetector CT imaging of the abdomen and pelvis was performed using the standard protocol following bolus administration of intravenous contrast. CONTRAST:  6mL OMNIPAQUE IOHEXOL 300 MG/ML  SOLN COMPARISON:  Lung bases are clear. Normal heart size. No pericardial effusion. FINDINGS: Lower chest: Atelectatic changes in the otherwise clear lung bases. Cardiac size within normal limits. No pericardial effusion.  Hepatobiliary: Diffusely decreased hepatic attenuation with some heterogeneous enhancement. May reflect a degree of hepatic steatosis with underlying cirrhotic change and regenerative nodules. No focal discernible lesions. Nodular liver surface contour. Slight left lobe hypertrophy and prominence of the caudate lobe further suggestive of cirrhotic change. Recanalization of the umbilical vein. Mild distension of the gallbladder with layering attenuation and a calcified gallstone. No gallbladder wall thickening is seen however. No focal pericholecystic inflammation or biliary ductal dilatation. No intraductal gallstones. Pancreas: Insert focal inflammatory changes which are seen about the distal pancreatic tail with some mild edematous thickening. Uniform pancreatic enhancement without evidence of necrosis. No discernible organized collection or abscess. No pancreatic ductal dilatation. Spleen: Borderline splenomegaly.  No focal splenic lesion. Adrenals/Urinary Tract: No concerning adrenal nodules. Kidneys enhance and excrete symmetrically. No worrisome renal lesions. No urolithiasis or hydronephrosis. Urinary bladder is physiologically distended and otherwise unremarkable. Stomach/Bowel: Distal esophagus is unremarkable. Vascular collaterals about the stomach. Diffuse edematous changes are seen in of the proximal gastric mucosa. Few  hypodense foci (5/37) could reflect site of gastric ulceration. Linear metallic density may reflect endoscopy clip. Mild nonspecific thickening of the duodenum. No other small bowel thickening or dilatation. There is diffuse edematous mural thickening throughout the colon most pronounced in the cecum and descending segments. A normal appendix is visualized. Vascular/Lymphatic: Extensive upper abdominal venous collaterals including numerous gastric collaterals and likely varices. Recanalization of the umbilical vein as above. Portal vein, splenic vein and SMV are patent. Hepatic vein is  grossly patent. Atherosclerotic plaque within the normal caliber aorta. Additional calcification of the branch vessels. Some edematous appearing central mesenteric nodes. No pathologically enlarged adenopathy. Reproductive: Radiopaque IUD in expected position within the endometrial canal. No concerning adnexal lesions. Other: Small volume ascites in the abdomen predominantly contained within the subphrenic space on the right with more mild diffuse central mesenteric edema. There is some focal inflammation centered upon the tail of the pancreas, within the lesser sac and extending into the left pericolic gutter. No abdominopelvic free air. Circumferential body wall edema. Musculoskeletal: Stable appearance of compression deformities at the T10, L1 and L3 levels. Levocurvature of the spine is likely related to patient positioning. Ill-defined sclerosis in the right posterior sacrum may reflect site of bone graft harvest or marrow sampling. No new acute or worrisome osseous lesions. IMPRESSION: 1. Diffuse edematous changes in the proximal gastric mucosa with a focal site of mural hypoattenuation in some adjacent inflammation which could reflect additional site of ulceration separate from a endoscopic clip along the greater curvature. Correlate with patient's symptoms and consider further evaluation with repeat endoscopy as clinically indicated. 2. Cirrhosis with sequelae of portal hypertension including heterogeneous, nodular liver, recanalized umbilical vein, numerous upper abdominal venous collaterals including numerous gastric collaterals and likely varices, splenomegaly and ascites. 3. Mild edematous mural thickening of the duodenum and colon is nonspecific in the setting of cirrhosis and ascites. Correlate for abdominal symptoms of acute gastritis/colitis versus features of portal enteropathy. 4. Question some mild edematous thickening at the tail of the pancreas with some adjacent fluid in the lesser sac, while  this could reflect a pancreatitis distributed inflammation from the gastric process above or ascitic fluid could mimic this appearance. Correlate with lipase. 5. Cholelithiasis without evidence of acute cholecystitis. If there is clinical concern for acute cholecystitis, consider further evaluation with right upper quadrant ultrasound. 6. Stable appearance of compression deformities at the T10, L1 and L3 levels. Ill-defined sclerosis in the right posterior sacrum may reflect site of bone graft harvest or marrow sampling. 7. Aortic Atherosclerosis (ICD10-I70.0). Electronically Signed   By: Lovena Le M.D.   On: 11/23/2019 23:36   DG Knee Complete 4 Views Right  Result Date: 11/23/2019 CLINICAL DATA:  Knee pain, fall EXAM: RIGHT KNEE - COMPLETE 4+ VIEW COMPARISON:  None. FINDINGS: No fracture or dislocation. Large amount of soft tissue swelling medially and posteriorly. Moderate to large knee effusion. Tricompartment arthritis, advanced involving the lateral joint space. IMPRESSION: Prominent soft tissue swelling with moderate to large knee effusion. No definitive fracture. Arthritis. Electronically Signed   By: Donavan Foil M.D.   On: 11/23/2019 19:20    ASSESSMENT / PLAN:    EtOH Withdrawal  Hx Etoh abuse with multiple admission in the past for similar - Continue Precedex weaning as tolerated. 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 Initial concerns for GI bleed. Hx: GERD/PUD s/p EGD 10/23/2019 with no evidence of ulcers - Continue PPI and Carafate for mucosal protection - Helicobacter pylori Ab + stool Ag pending - Hold NSAIDs, steroids,  ASA - NO plans for EGD per GI recommendations will follow on outpatient basis - GI input appreciated  Suspected UTI -UA positive -Urine culture inconclusive -Received dose of Ceftriaxone -WBC improved  Iron Deficiency Anemia - Likely macrocytic secondary to vit B12/Folate - Replete as above  Dieulafoy lesion of stomach - Hemoclip hemostasis achieved.  - NO gross recurrence of GI bleeding  Hyponatremia - Improved with IVFs - Monitor BMP  Hypokalemia -Repleted + Mag -Monitor electrolytes and replace per protocol  Thrombocytopenia - Likely alcohol induced/ -No signs of bleeding    Best Practice: VTE px: Holding due to GI bleed SUP px:IV Protonix Diet:Soft Regular Diet   Rufina Falco. DNP, CCRN, FNP-BC [facility]   11/25/2019, 1:12 PM

## 2019-11-26 DIAGNOSIS — F10239 Alcohol dependence with withdrawal, unspecified: Secondary | ICD-10-CM

## 2019-11-26 LAB — PHOSPHORUS: Phosphorus: 2.2 mg/dL — ABNORMAL LOW (ref 2.5–4.6)

## 2019-11-26 LAB — COMPREHENSIVE METABOLIC PANEL
ALT: 56 U/L — ABNORMAL HIGH (ref 0–44)
AST: 170 U/L — ABNORMAL HIGH (ref 15–41)
Albumin: 2.5 g/dL — ABNORMAL LOW (ref 3.5–5.0)
Alkaline Phosphatase: 73 U/L (ref 38–126)
Anion gap: 8 (ref 5–15)
BUN: <5 mg/dL — ABNORMAL LOW (ref 6–20)
CO2: 23 mmol/L (ref 22–32)
Calcium: 7.4 mg/dL — ABNORMAL LOW (ref 8.9–10.3)
Chloride: 106 mmol/L (ref 98–111)
Creatinine, Ser: <0.3 mg/dL — ABNORMAL LOW (ref 0.44–1.00)
Glucose, Bld: 123 mg/dL — ABNORMAL HIGH (ref 70–99)
Potassium: 3 mmol/L — ABNORMAL LOW (ref 3.5–5.1)
Sodium: 137 mmol/L (ref 135–145)
Total Bilirubin: 3.7 mg/dL — ABNORMAL HIGH (ref 0.3–1.2)
Total Protein: 5.7 g/dL — ABNORMAL LOW (ref 6.5–8.1)

## 2019-11-26 LAB — CBC
HCT: 27.5 % — ABNORMAL LOW (ref 36.0–46.0)
Hemoglobin: 9.4 g/dL — ABNORMAL LOW (ref 12.0–15.0)
MCH: 27.2 pg (ref 26.0–34.0)
MCHC: 34.2 g/dL (ref 30.0–36.0)
MCV: 79.5 fL — ABNORMAL LOW (ref 80.0–100.0)
Platelets: 38 10*3/uL — ABNORMAL LOW (ref 150–400)
RBC: 3.46 MIL/uL — ABNORMAL LOW (ref 3.87–5.11)
RDW: 17.9 % — ABNORMAL HIGH (ref 11.5–15.5)
WBC: 3.2 10*3/uL — ABNORMAL LOW (ref 4.0–10.5)
nRBC: 0 % (ref 0.0–0.2)

## 2019-11-26 LAB — MAGNESIUM: Magnesium: 1.6 mg/dL — ABNORMAL LOW (ref 1.7–2.4)

## 2019-11-26 MED ORDER — POTASSIUM CHLORIDE 10 MEQ/100ML IV SOLN
10.0000 meq | INTRAVENOUS | Status: AC
  Administered 2019-11-26 (×4): 10 meq via INTRAVENOUS
  Filled 2019-11-26 (×4): qty 100

## 2019-11-26 MED ORDER — MAGNESIUM SULFATE 4 GM/100ML IV SOLN
4.0000 g | Freq: Once | INTRAVENOUS | Status: AC
  Administered 2019-11-26: 4 g via INTRAVENOUS
  Filled 2019-11-26: qty 100

## 2019-11-26 MED ORDER — SODIUM PHOSPHATES 45 MMOLE/15ML IV SOLN
20.0000 mmol | Freq: Once | INTRAVENOUS | Status: AC
  Administered 2019-11-26: 20 mmol via INTRAVENOUS
  Filled 2019-11-26: qty 6.67

## 2019-11-26 MED ORDER — SODIUM CHLORIDE 0.9 % IV BOLUS
1000.0000 mL | Freq: Once | INTRAVENOUS | Status: AC
  Administered 2019-11-26: 1000 mL via INTRAVENOUS

## 2019-11-26 MED ORDER — MIDAZOLAM HCL 2 MG/2ML IJ SOLN
INTRAMUSCULAR | Status: AC
  Filled 2019-11-26: qty 4

## 2019-11-26 MED ORDER — ROCURONIUM BROMIDE 50 MG/5ML IV SOLN
INTRAVENOUS | Status: AC
  Filled 2019-11-26: qty 2

## 2019-11-26 MED ORDER — ETOMIDATE 2 MG/ML IV SOLN
INTRAVENOUS | Status: AC
  Filled 2019-11-26: qty 20

## 2019-11-26 NOTE — Progress Notes (Signed)
Patient has been on Precedex drip throughout the shift.  Patient would randomly attempt to get out of the bed, yelling out and crying for her mom and to go home.  She would scream for pain medicine and lay back down again.  Speech is very slurred and patient is difficult to understand.  Sips of water provided, however, I felt it was unsafe for her to eat with her mentation.  Around 1730  I noticed patient was severely hypotensive and Dr. Jimmye Norman was notified and order for 1 liter bolus of IVF received.  Patient responded well. Patient is currently resting in bed.

## 2019-11-26 NOTE — Progress Notes (Signed)
Per Dr. Jimmye Norman, held on the infusion of Potassium until Magnesium was completed.

## 2019-11-26 NOTE — Progress Notes (Signed)
PROGRESS NOTE    Rhonda Martin  LPF:[id number] DOB: [birth year] DOA: 11/23/2019 PCP: Volney American, PA-C   Assessment & Plan:   Active Problems:   GI bleeding  Alcohol abuse & withdrawal: alcohol cessation counseling. Continue on CIWA protocol. Continue on precedex drip & wean as tolerated. Lethargic still today   Hx of GI bleeding: as per GI. 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 Will continue to monitor H&H   Hx of narcotic abuse: as per pt's husband. Pt will take a 30 day supply of narcotics and take them in 10 days. 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 Will continue to monitor H&H & platelets   Hypokalemia: KCl repleted again. Will continue to monitor  Hypomagnesemia: mg sulfate ordered. Give Mg prior to giving KCl. Will continue to monitor  Hypophosphatemia: phosphate repleted. Will continue to monitor   Hyponatremia: resolved  Intractable nausea and vomiting: resolved  Possible UTI: UA is positive, urine cx likely a containment. Continue on IV rocephin x3 days   Depression: severity unknown. Continue on home dose of cymbalta  Peripheral neuropathy: will continue on gabapentin   DVT prophylaxis: SCDs Code Status: full Family Communication:  Disposition Plan: depends on PT/OT recs.   Status is: Inpatient  Remains inpatient appropriate because:Ongoing diagnostic testing needed not appropriate for outpatient work up   Dispo: The patient is from: Home              Anticipated d/c is to: SNF vs home health vs home              Anticipated d/c date is: >3 days              Patient currently is not medically stable to d/c.      Consultants:   GI   Procedures:    Antimicrobials:  rocephin   Subjective: Pt is lethargic and c/o pain.   Objective: Vitals:   11/26/19 0100 11/26/19 0200 11/26/19 0300 11/26/19 0400  BP: (!) 99/59 102/65 96/68 100/66  Pulse: 75 74 75 69  Resp: 18 15 16 14   Temp:   98.3 F (36.8 C)   TempSrc:   Axillary   SpO2: 92% 96% 100% 95%  Weight:      Height:        Intake/Output Summary (Last 24 hours) at 11/26/2019 0808 Last data filed at 11/26/2019 0300 Gross per 24 hour  Intake 3389.01 ml  Output 1900 ml  Net 1489.01 ml   Filed Weights   11/24/19 1805 11/25/19 0534 11/25/19 0820  Weight: 54.2 kg 55.7 kg 55.5 kg    Examination:  General exam: Appears calm and comfortable. Appears older than stated age  Respiratory system: decreased breath sounds b/l. No wheezes Cardiovascular system: S1 & S2 +. No  rubs, gallops or clicks.  Gastrointestinal system: Abdomen is nondistended, soft and NT. hypoactive bowel sounds heard. Central nervous system: Lethargic. Not able to do a neuro exam Psychiatry: Judgement and insight appear abnormal.     Data Reviewed: I have personally reviewed following labs and imaging studies  CBC: Recent Labs  Lab 11/23/19 1437 11/23/19 2300 11/24/19 0505 11/25/19 1030 11/26/19 0404  WBC 13.3* 7.4 5.4 3.5* 3.2*  HGB  10.8* 9.5* 8.8* 8.5* 9.4*  HCT 32.9* 27.4* 26.9* 24.3* 27.5*  MCV 81.4 79.0* 83.3 78.6* 79.5*  PLT 107* 63* 57* 54* 38*   Basic Metabolic Panel: Recent Labs  Lab 11/23/19 1437 11/24/19 0505 11/24/19 1731 11/25/19 1030 11/26/19 0404  NA 131* 132*  --  135 137  K 3.6 2.9*  --  2.6* 3.0*  CL 93* 101  --  103 106  CO2 16* 17*  --  24 23  GLUCOSE 90 85  --  112* 123*  BUN 12 7  --  <5* <5*  CREATININE 0.60 0.59  --  0.41* <0.30*  CALCIUM 8.6* 7.4*  --  7.6* 7.4*  MG  --   --  1.4*  --  1.6*  PHOS  --   --  1.1*  --  2.2*   GFR: CrCl cannot be calculated (This lab value cannot be used to calculate CrCl because it is not a number: <0.30). Liver Function Tests: Recent Labs    Lab 11/23/19 1437 11/25/19 1030 11/26/19 0404  AST 161* 117* 170*  ALT 49* 42 56*  ALKPHOS 86 70 73  BILITOT 3.7* 3.1* 3.7*  PROT 7.9 5.5* 5.7*  ALBUMIN 3.5 2.6* 2.5*   Recent Labs  Lab 11/23/19 1437  LIPASE 37   No results for input(s): AMMONIA in the last 168 hours. Coagulation Profile: Recent Labs  Lab 11/23/19 1620 11/24/19 0505  INR 1.6* 1.8*   Cardiac Enzymes: No results for input(s): CKTOTAL, CKMB, CKMBINDEX, TROPONINI in the last 168 hours. BNP (last 3 results) No results for input(s): PROBNP in the last 8760 hours. HbA1C: No results for input(s): HGBA1C in the last 72 hours. CBG: Recent Labs  Lab 11/25/19 0821  GLUCAP 104*   Lipid Profile: No results for input(s): CHOL, HDL, LDLCALC, TRIG, CHOLHDL, LDLDIRECT in the last 72 hours. Thyroid Function Tests: No results for input(s): TSH, T4TOTAL, FREET4, T3FREE, THYROIDAB in the last 72 hours. Anemia Panel: No results for input(s): VITAMINB12, FOLATE, FERRITIN, TIBC, IRON, RETICCTPCT in the last 72 hours. Sepsis Labs: Recent Labs  Lab 11/23/19 2300 11/24/19 0341 11/24/19 0505  PROCALCITON  --   --  <0.10  LATICACIDVEN 2.1* 0.7  --     Recent Results (from the past 240 hour(s))  Body fluid culture     Status: None (Preliminary result)   Collection Time: 11/23/19  8:50 PM   Specimen: KNEE; Body Fluid  Result Value Ref Range Status   Specimen Description   Final    KNEE RIGHT JOINT Performed at [facility], [address] [place], [place] [address]    Special Requests   Final    NONE Performed at [facility], [address] [place], [place] [address]    Gram Stain   Final    MODERATE WBC PRESENT,BOTH PMN AND MONONUCLEAR NO ORGANISMS SEEN    Culture   Final    NO GROWTH 1 DAY Performed at [facility], [place] [address], [place], [place] [address]    Report Status PENDING  Incomplete  Gram stain     Status: None   Collection Time: 11/23/19  8:50 PM   Specimen:  KNEE; Body Fluid  Result Value Ref Range Status   Specimen Description KNEE  Final   Special Requests NONE  Final   Gram Stain   Final    WBC SEEN RED BLOOD CELLS PRESENT NO ORGANISMS SEEN Performed at [facility], [place],  [place] [address]    Report Status 11/23/2019 FINAL  Final  Urine Culture     Status: Abnormal   Collection Time: 11/23/19 10:26 PM   Specimen: Urine, Catheterized  Result Value Ref Range Status   Specimen Description   Final    URINE, CATHETERIZED Performed at [facility], [address] [place], [place] [address]    Special Requests   Final    NONE Performed at [facility], [address] [place], [place] [address]    Culture MULTIPLE SPECIES PRESENT, SUGGEST RECOLLECTION (A)  Final   Report Status 11/25/2019 FINAL  Final  Blood culture (routine x 2)     Status: None (Preliminary result)   Collection Time: 11/23/19 11:00 PM   Specimen: BLOOD  Result Value Ref Range Status   Specimen Description BLOOD BLOOD RIGHT HAND  Final   Special Requests   Final    BOTTLES DRAWN AEROBIC ONLY Blood Culture adequate volume   Culture   Final    NO GROWTH 3 DAYS Performed at [facility], [address] [place], [place] [address]    Report Status PENDING  Incomplete  Blood culture (routine x 2)     Status: None (Preliminary result)   Collection Time: 11/23/19 11:00 PM   Specimen: BLOOD  Result Value Ref Range Status   Specimen Description BLOOD BLOOD LEFT HAND  Final   Special Requests   Final    BOTTLES DRAWN AEROBIC AND ANAEROBIC Blood Culture adequate volume   Culture   Final    NO GROWTH 3 DAYS Performed at [facility], [address], [place], [place] [address]    Report Status PENDING  Incomplete  SARS Coronavirus 2 by RT PCR (hospital order, performed in [place] hospital lab) Nasopharyngeal Nasopharyngeal Swab     Status: None   Collection Time: 11/24/19  3:30 AM   Specimen:  Nasopharyngeal Swab  Result Value Ref Range Status   SARS Coronavirus 2 NEGATIVE NEGATIVE Final    Comment: (NOTE) SARS-CoV-2 target nucleic acids are NOT DETECTED.  The SARS-CoV-2 RNA is generally detectable in upper and lower respiratory specimens during the acute phase of infection. The lowest concentration of SARS-CoV-2 viral copies this assay can detect is 250 copies / mL. A negative result does not preclude SARS-CoV-2 infection and should not be used as the sole basis for treatment or other patient management decisions.  A negative result may occur with improper specimen collection / handling, submission of specimen other than nasopharyngeal swab, presence of viral mutation(s) within the areas targeted by this assay, and inadequate number of viral copies (<250 copies / mL). A negative result must be combined with clinical observations, patient history, and epidemiological information.  Fact Sheet for Patients:   StrictlyIdeas.no  Fact Sheet for Healthcare Providers: BankingDealers.co.za  This test is not yet approved or  cleared by the [place] FDA and has been authorized for detection and/or diagnosis of SARS-CoV-2 by FDA under an Emergency Use Authorization (EUA).  This EUA will remain in effect (meaning this test can be used) for the duration of the COVID-19 declaration under Section 564(b)(1) of the Act, 21 U.S.C. section 360bbb-3(b)(1), unless the authorization is terminated or revoked sooner.  Performed at [facility], [address] [place], [place] [address]   MRSA PCR Screening     Status: None   Collection Time: 11/25/19  8:19 AM   Specimen: Nasal Mucosa; Nasopharyngeal  Result Value Ref Range Status  MRSA by PCR NEGATIVE NEGATIVE Final    Comment:        The GeneXpert MRSA Assay (FDA approved for NASAL specimens only), is one component of a comprehensive MRSA colonization surveillance  program. It is not intended to diagnose MRSA infection nor to guide or monitor treatment for MRSA infections. Performed at [facility], [address] [place], [place] [address]          Radiology Studies: No results found.      Scheduled Meds: . etomidate      . midazolam      . rocuronium      . Chlorhexidine Gluconate Cloth  6 each Topical Daily  . DULoxetine  20 mg Oral Daily  . folic acid  1 mg Oral Daily  . LORazepam  0-4 mg Intravenous Q4H   Followed by  . LORazepam  0-4 mg Intravenous Q8H  . multivitamin with minerals  1 tablet Oral Daily  . pantoprazole  40 mg Oral Daily  . sodium chloride flush  3 mL Intravenous Once  . sucralfate  1 g Oral TID WC & HS  . thiamine  100 mg Oral Daily  . vitamin B-12  250 mcg Oral Daily   Continuous Infusions: . sodium chloride 100 mL/hr at 11/26/19 0627  . cefTRIAXone (ROCEPHIN)  IV Stopped (11/25/19 1113)  . dexmedetomidine (PRECEDEX) IV infusion 0.6 mcg/kg/hr (11/26/19 7035)  . magnesium sulfate bolus IVPB    . potassium chloride    . sodium phosphate  Dextrose 5% IVPB       LOS: 2 days    Time spent: 31 mins     Wyvonnia Dusky, MD [facility] Pager [phone]  If 7PM-7AM, please contact night-coverage [email or web address] 11/26/2019, 8:08 AM

## 2019-11-27 DIAGNOSIS — F10929 Alcohol use, unspecified with intoxication, unspecified: Secondary | ICD-10-CM

## 2019-11-27 LAB — COMPREHENSIVE METABOLIC PANEL
ALT: 61 U/L — ABNORMAL HIGH (ref 0–44)
AST: 167 U/L — ABNORMAL HIGH (ref 15–41)
Albumin: 2.2 g/dL — ABNORMAL LOW (ref 3.5–5.0)
Alkaline Phosphatase: 68 U/L (ref 38–126)
Anion gap: 3 — ABNORMAL LOW (ref 5–15)
BUN: <5 mg/dL — ABNORMAL LOW (ref 6–20)
CO2: 22 mmol/L (ref 22–32)
Calcium: 7.1 mg/dL — ABNORMAL LOW (ref 8.9–10.3)
Chloride: 113 mmol/L — ABNORMAL HIGH (ref 98–111)
Creatinine, Ser: <0.3 mg/dL — ABNORMAL LOW (ref 0.44–1.00)
Glucose, Bld: 116 mg/dL — ABNORMAL HIGH (ref 70–99)
Potassium: 3.2 mmol/L — ABNORMAL LOW (ref 3.5–5.1)
Sodium: 138 mmol/L (ref 135–145)
Total Bilirubin: 2.4 mg/dL — ABNORMAL HIGH (ref 0.3–1.2)
Total Protein: 5.2 g/dL — ABNORMAL LOW (ref 6.5–8.1)

## 2019-11-27 LAB — BODY FLUID CULTURE: Culture: NO GROWTH

## 2019-11-27 LAB — CBC
HCT: 26.8 % — ABNORMAL LOW (ref 36.0–46.0)
Hemoglobin: 9 g/dL — ABNORMAL LOW (ref 12.0–15.0)
MCH: 27.7 pg (ref 26.0–34.0)
MCHC: 33.6 g/dL (ref 30.0–36.0)
MCV: 82.5 fL (ref 80.0–100.0)
Platelets: 42 10*3/uL — ABNORMAL LOW (ref 150–400)
RBC: 3.25 MIL/uL — ABNORMAL LOW (ref 3.87–5.11)
RDW: 17.9 % — ABNORMAL HIGH (ref 11.5–15.5)
WBC: 4 10*3/uL (ref 4.0–10.5)
nRBC: 0 % (ref 0.0–0.2)

## 2019-11-27 LAB — MAGNESIUM: Magnesium: 1.9 mg/dL (ref 1.7–2.4)

## 2019-11-27 LAB — PHOSPHORUS: Phosphorus: 1.6 mg/dL — ABNORMAL LOW (ref 2.5–4.6)

## 2019-11-27 MED ORDER — POTASSIUM CHLORIDE CRYS ER 20 MEQ PO TBCR: 40.0000 meq | EXTENDED_RELEASE_TABLET | Freq: Once | ORAL | Status: DC

## 2019-11-27 MED ORDER — POTASSIUM & SODIUM PHOSPHATES 280-160-250 MG PO PACK
1.0000 | PACK | Freq: Three times a day (TID) | ORAL | Status: DC
  Filled 2019-11-27 (×3): qty 1

## 2019-11-27 MED ORDER — POTASSIUM CHLORIDE 10 MEQ/100ML IV SOLN
10.0000 meq | INTRAVENOUS | Status: AC
  Administered 2019-11-27 (×4): 10 meq via INTRAVENOUS
  Filled 2019-11-27 (×8): qty 100

## 2019-11-27 MED ORDER — SODIUM PHOSPHATES 45 MMOLE/15ML IV SOLN
30.0000 mmol | Freq: Once | INTRAVENOUS | Status: AC
  Administered 2019-11-27: 30 mmol via INTRAVENOUS
  Filled 2019-11-27: qty 10

## 2019-11-27 NOTE — Chronic Care Management (AMB) (Signed)
  Care Management   Note  11/27/2019 Name: Rhonda Martin MRN: [id number] DOB: May 05, 1976  Rhonda Martin is a 44 y.o. year old female who is a primary care patient of Volney American, Hershal Coria and is actively engaged with the care management team. I reached out to [name] by phone today to assist with re-scheduling a follow up visit with the RN Case Manager  Follow up plan: Unable to make contact on outreach attempts x 3. PCP Merrie Roof PA and Dellie Catholic RN CM  notified via routed documentation in medical record.   Noreene Larsson, [place], [place], [place] [address] Direct Dial: [phone] [place] [email or web address] Website: [email or web address]

## 2019-11-27 NOTE — Progress Notes (Signed)
PROGRESS NOTE    Rhonda Martin  ZOX:[id number] DOB: November 11, 1975 DOA: 11/23/2019 PCP: Volney American, PA-C   Assessment & Plan:   Active Problems:   GI bleeding  Alcohol abuse & withdrawal: alcohol cessation counseling. Continue on CIWA protocol. Continue on precedex drip & wean as tolerated. Lethargic still but able some questions appropriately   Hx of GI bleeding: as per GI. 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 Will continue to monitor H&H   Hx of narcotic abuse: as per pt's husband. Pt will take a 30 day supply of narcotics and take them in 10 days. 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 Will continue to monitor H&H & platelets   Hypokalemia: KCl repleted again. Will continue to monitor  Hypomagnesemia: WNL today. Will continue to monitor  Hypophosphatemia: phosphate repleted again. Will continue to monitor   Hyponatremia: resolved  Intractable nausea and vomiting: resolved  Possible UTI: UA is positive, urine cx likely a containment. Completed abx course  Depression: severity unknown. Continue on home dose of cymbalta  Peripheral neuropathy: will continue on gabapentin   DVT prophylaxis: SCDs Code Status: full Family Communication:  Disposition Plan: depends on PT/OT recs.   Status is: Inpatient  Remains inpatient appropriate because:Ongoing diagnostic testing needed not appropriate for outpatient work up. Still on precedex drip   Dispo: The patient is from: Home              Anticipated d/c is to: SNF vs home health vs home              Anticipated d/c date is: >3 days              Patient currently is not medically stable to d/c.      Consultants:   GI   Procedures:     Antimicrobials: rocephin   Subjective: Pt is lethargic still.   Objective: Vitals:   11/27/19 0400 11/27/19 0500 11/27/19 0600 11/27/19 0840  BP: (!) 90/52 97/60 108/67   Pulse: 69 69 70 70  Resp: 14 14 (!) 0   Temp: 97.9 F (36.6 C)     TempSrc: Axillary     SpO2: 97% 96% 95%   Weight:      Height:        Intake/Output Summary (Last 24 hours) at 11/27/2019 0905 Last data filed at 11/27/2019 0600 Gross per 24 hour  Intake 4402.09 ml  Output 2000 ml  Net 2402.09 ml   Filed Weights   11/24/19 1805 11/25/19 0534 11/25/19 0820  Weight: 54.2 kg 55.7 kg 55.5 kg    Examination:  General exam: Appears calm and comfortable. Appears older than stated age  Respiratory system: diminished breath sounds b/l. No rales Cardiovascular system: S1 & S2 +. No  rubs, gallops or clicks.  Gastrointestinal system: Abdomen is nondistended, soft and NT. Normal bowel sounds heard. Central nervous system: Lethargic. Not able to do a neuro exam Psychiatry: Judgement and insight appear abnormal.     Data Reviewed: I have personally reviewed following labs and imaging studies  CBC: Recent Labs  Lab 11/23/19 2300 11/24/19 0505 11/25/19 1030 11/26/19 0404 11/27/19 0537  WBC 7.4 5.4 3.5* 3.2* 4.0  HGB 9.5* 8.8* 8.5*  9.4* 9.0*  HCT 27.4* 26.9* 24.3* 27.5* 26.8*  MCV 79.0* 83.3 78.6* 79.5* 82.5  PLT 63* 57* 54* 38* 42*   Basic Metabolic Panel: Recent Labs  Lab 11/23/19 1437 11/24/19 0505 11/24/19 1731 11/25/19 1030 11/26/19 0404 11/27/19 0537  NA 131* 132*  --  135 137 138  K 3.6 2.9*  --  2.6* 3.0* 3.2*  CL 93* 101  --  103 106 113*  CO2 16* 17*  --  24 23 22   GLUCOSE 90 85  --  112* 123* 116*  BUN 12 7  --  <5* <5* <5*  CREATININE 0.60 0.59  --  0.41* <0.30* <0.30*  CALCIUM 8.6* 7.4*  --  7.6* 7.4* 7.1*  MG  --   --  1.4*  --  1.6* 1.9  PHOS  --   --  1.1*  --  2.2* 1.6*   GFR: CrCl cannot be calculated (This lab value cannot be used to calculate CrCl because it is  not a number: <0.30). Liver Function Tests: Recent Labs  Lab 11/23/19 1437 11/25/19 1030 11/26/19 0404 11/27/19 0537  AST 161* 117* 170* 167*  ALT 49* 42 56* 61*  ALKPHOS 86 70 73 68  BILITOT 3.7* 3.1* 3.7* 2.4*  PROT 7.9 5.5* 5.7* 5.2*  ALBUMIN 3.5 2.6* 2.5* 2.2*   Recent Labs  Lab 11/23/19 1437  LIPASE 37   No results for input(s): AMMONIA in the last 168 hours. Coagulation Profile: Recent Labs  Lab 11/23/19 1620 11/24/19 0505  INR 1.6* 1.8*   Cardiac Enzymes: No results for input(s): CKTOTAL, CKMB, CKMBINDEX, TROPONINI in the last 168 hours. BNP (last 3 results) No results for input(s): PROBNP in the last 8760 hours. HbA1C: No results for input(s): HGBA1C in the last 72 hours. CBG: Recent Labs  Lab 11/25/19 0821  GLUCAP 104*   Lipid Profile: No results for input(s): CHOL, HDL, LDLCALC, TRIG, CHOLHDL, LDLDIRECT in the last 72 hours. Thyroid Function Tests: No results for input(s): TSH, T4TOTAL, FREET4, T3FREE, THYROIDAB in the last 72 hours. Anemia Panel: No results for input(s): VITAMINB12, FOLATE, FERRITIN, TIBC, IRON, RETICCTPCT in the last 72 hours. Sepsis Labs: Recent Labs  Lab 11/23/19 2300 11/24/19 0341 11/24/19 0505  PROCALCITON  --   --  <0.10  LATICACIDVEN 2.1* 0.7  --     Recent Results (from the past 240 hour(s))  Body fluid culture     Status: None (Preliminary result)   Collection Time: 11/23/19  8:50 PM   Specimen: KNEE; Body Fluid  Result Value Ref Range Status   Specimen Description   Final    KNEE RIGHT JOINT Performed at [facility], [address], [place], [place] [address]    Special Requests   Final    NONE Performed at [facility], [address] [place], [place] [address]    Gram Stain   Final    MODERATE WBC PRESENT,BOTH PMN AND MONONUCLEAR NO ORGANISMS SEEN    Culture   Final    NO GROWTH 2 DAYS Performed at [facility], [place] [address], [place], [place] [address]    Report Status  PENDING  Incomplete  Gram stain     Status: None   Collection Time: 11/23/19  8:50 PM   Specimen: KNEE; Body Fluid  Result Value Ref Range Status   Specimen Description KNEE  Final   Special Requests NONE  Final   Gram Stain   Final    WBC SEEN RED  BLOOD CELLS PRESENT NO ORGANISMS SEEN Performed at [facility], [address] [place], [place] [address]    Report Status 11/23/2019 FINAL  Final  Urine Culture     Status: Abnormal   Collection Time: 11/23/19 10:26 PM   Specimen: Urine, Catheterized  Result Value Ref Range Status   Specimen Description   Final    URINE, CATHETERIZED Performed at [facility], [address], [place], [place] [address]    Special Requests   Final    NONE Performed at [facility], [address] [place], [place] [address]    Culture MULTIPLE SPECIES PRESENT, SUGGEST RECOLLECTION (A)  Final   Report Status 11/25/2019 FINAL  Final  Blood culture (routine x 2)     Status: None (Preliminary result)   Collection Time: 11/23/19 11:00 PM   Specimen: BLOOD  Result Value Ref Range Status   Specimen Description BLOOD BLOOD RIGHT HAND  Final   Special Requests   Final    BOTTLES DRAWN AEROBIC ONLY Blood Culture adequate volume   Culture   Final    NO GROWTH 4 DAYS Performed at [facility], [address], [place], [place] [address]    Report Status PENDING  Incomplete  Blood culture (routine x 2)     Status: None (Preliminary result)   Collection Time: 11/23/19 11:00 PM   Specimen: BLOOD  Result Value Ref Range Status   Specimen Description BLOOD BLOOD LEFT HAND  Final   Special Requests   Final    BOTTLES DRAWN AEROBIC AND ANAEROBIC Blood Culture adequate volume   Culture   Final    NO GROWTH 4 DAYS Performed at [facility], [address] [place], [place] [address]    Report Status PENDING  Incomplete  SARS Coronavirus 2 by RT PCR (hospital order, performed in [place] hospital lab)  Nasopharyngeal Nasopharyngeal Swab     Status: None   Collection Time: 11/24/19  3:30 AM   Specimen: Nasopharyngeal Swab  Result Value Ref Range Status   SARS Coronavirus 2 NEGATIVE NEGATIVE Final    Comment: (NOTE) SARS-CoV-2 target nucleic acids are NOT DETECTED.  The SARS-CoV-2 RNA is generally detectable in upper and lower respiratory specimens during the acute phase of infection. The lowest concentration of SARS-CoV-2 viral copies this assay can detect is 250 copies / mL. A negative result does not preclude SARS-CoV-2 infection and should not be used as the sole basis for treatment or other patient management decisions.  A negative result may occur with improper specimen collection / handling, submission of specimen other than nasopharyngeal swab, presence of viral mutation(s) within the areas targeted by this assay, and inadequate number of viral copies (<250 copies / mL). A negative result must be combined with clinical observations, patient history, and epidemiological information.  Fact Sheet for Patients:   StrictlyIdeas.no  Fact Sheet for Healthcare Providers: BankingDealers.co.za  This test is not yet approved or  cleared by the [place] FDA and has been authorized for detection and/or diagnosis of SARS-CoV-2 by FDA under an Emergency Use Authorization (EUA).  This EUA will remain in effect (meaning this test can be used) for the duration of the COVID-19 declaration under Section 564(b)(1) of the Act, 21 U.S.C. section 360bbb-3(b)(1), unless the authorization is terminated or revoked sooner.  Performed at [facility], [address], [place], [place] [address]   MRSA PCR Screening     Status: None   Collection Time: 11/25/19  8:19 AM   Specimen: Nasal Mucosa; Nasopharyngeal  Result Value Ref Range Status   MRSA by PCR NEGATIVE NEGATIVE Final    Comment:        The GeneXpert MRSA Assay (FDA approved  for NASAL specimens only), is one component of a comprehensive MRSA colonization surveillance program. It is not intended to diagnose MRSA infection nor to guide or monitor treatment for MRSA infections. Performed at [facility], [address], [place], [place] [address]          Radiology Studies: No results found.      Scheduled Meds: . Chlorhexidine Gluconate Cloth  6 each Topical Daily  . DULoxetine  20 mg Oral Daily  . folic acid  1 mg Oral Daily  . LORazepam  0-4 mg Intravenous Q8H  . multivitamin with minerals  1 tablet Oral Daily  . pantoprazole  40 mg Oral Daily  . sodium chloride flush  3 mL Intravenous Once  . sucralfate  1 g Oral TID WC & HS  . thiamine  100 mg Oral Daily  . vitamin B-12  250 mcg Oral Daily   Continuous Infusions: . sodium chloride 100 mL/hr at 11/27/19 0600  . cefTRIAXone (ROCEPHIN)  IV 1 g (11/27/19 7195)  . dexmedetomidine (PRECEDEX) IV infusion 0.8 mcg/kg/hr (11/27/19 0600)     LOS: 3 days    Time spent: 32 mins     Wyvonnia Dusky, MD [facility] Pager [phone]  If 7PM-7AM, please contact night-coverage [email or web address] 11/27/2019, 9:05 AM

## 2019-11-28 LAB — COMPREHENSIVE METABOLIC PANEL
ALT: 64 U/L — ABNORMAL HIGH (ref 0–44)
AST: 136 U/L — ABNORMAL HIGH (ref 15–41)
Albumin: 2.3 g/dL — ABNORMAL LOW (ref 3.5–5.0)
Alkaline Phosphatase: 80 U/L (ref 38–126)
Anion gap: 6 (ref 5–15)
BUN: <5 mg/dL — ABNORMAL LOW (ref 6–20)
CO2: 23 mmol/L (ref 22–32)
Calcium: 7.8 mg/dL — ABNORMAL LOW (ref 8.9–10.3)
Chloride: 108 mmol/L (ref 98–111)
Creatinine, Ser: <0.3 mg/dL — ABNORMAL LOW (ref 0.44–1.00)
Glucose, Bld: 123 mg/dL — ABNORMAL HIGH (ref 70–99)
Potassium: 3.3 mmol/L — ABNORMAL LOW (ref 3.5–5.1)
Sodium: 137 mmol/L (ref 135–145)
Total Bilirubin: 2.6 mg/dL — ABNORMAL HIGH (ref 0.3–1.2)
Total Protein: 5.8 g/dL — ABNORMAL LOW (ref 6.5–8.1)

## 2019-11-28 LAB — CBC
HCT: 31.1 % — ABNORMAL LOW (ref 36.0–46.0)
Hemoglobin: 10.4 g/dL — ABNORMAL LOW (ref 12.0–15.0)
MCH: 27.5 pg (ref 26.0–34.0)
MCHC: 33.4 g/dL (ref 30.0–36.0)
MCV: 82.3 fL (ref 80.0–100.0)
Platelets: 52 10*3/uL — ABNORMAL LOW (ref 150–400)
RBC: 3.78 MIL/uL — ABNORMAL LOW (ref 3.87–5.11)
RDW: 18.6 % — ABNORMAL HIGH (ref 11.5–15.5)
WBC: 3.1 10*3/uL — ABNORMAL LOW (ref 4.0–10.5)
nRBC: 0 % (ref 0.0–0.2)

## 2019-11-28 LAB — CULTURE, BLOOD (ROUTINE X 2)
Culture: NO GROWTH
Culture: NO GROWTH
Special Requests: ADEQUATE
Special Requests: ADEQUATE

## 2019-11-28 LAB — PHOSPHORUS: Phosphorus: 2.2 mg/dL — ABNORMAL LOW (ref 2.5–4.6)

## 2019-11-28 LAB — MAGNESIUM: Magnesium: 1.5 mg/dL — ABNORMAL LOW (ref 1.7–2.4)

## 2019-11-28 MED ORDER — SODIUM PHOSPHATES 45 MMOLE/15ML IV SOLN
30.0000 mmol | Freq: Once | INTRAVENOUS | Status: AC
  Administered 2019-11-28: 30 mmol via INTRAVENOUS
  Filled 2019-11-28: qty 10

## 2019-11-28 MED ORDER — POTASSIUM CHLORIDE 10 MEQ/100ML IV SOLN
10.0000 meq | INTRAVENOUS | Status: AC
  Administered 2019-11-28 (×4): 10 meq via INTRAVENOUS
  Filled 2019-11-28 (×4): qty 100

## 2019-11-28 MED ORDER — MAGNESIUM SULFATE 4 GM/100ML IV SOLN
4.0000 g | Freq: Once | INTRAVENOUS | Status: AC
  Administered 2019-11-28: 4 g via INTRAVENOUS
  Filled 2019-11-28: qty 100

## 2019-11-28 NOTE — Progress Notes (Signed)
Combative and uncooperative most of the day therefore remained sedated with prn Ativan per CIWA protocol. Mitts in place bilaterally. Refused to eat,drink or take her medications all day. Incontinent of urine x 2 even with Pure wic in place.

## 2019-11-28 NOTE — Progress Notes (Signed)
   11/28/19 1220  Clinical Encounter Type  Visited With Patient  Visit Type Initial  Referral From Chaplain  Consult/Referral To Chaplain  While walking the halls of ICU, chaplain stopped to see patient and silently prayed at the door.

## 2019-11-28 NOTE — Progress Notes (Signed)
PROGRESS NOTE    Rhonda Martin  NKN:[id number] DOB: 03/03/1976 DOA: 11/23/2019 PCP: Volney American, PA-C   Assessment & Plan:   Active Problems:   GI bleeding  Alcohol abuse & withdrawal: alcohol cessation counseling. Continue on CIWA protocol. Continue on precedex drip & wean as tolerated. Lethargic still  Hx of GI bleeding: as per GI. 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 Will continue to monitor H&H   Hx of narcotic abuse: as per pt's husband. Pt will take a 30 day supply of narcotics and take them in 10 days. 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 Will continue to monitor H&H & platelets   Hypokalemia: KCl repleted again. Will continue to monitor  Hypomagnesemia: mg sulfate repleted. Will continue to monitor  Hypophosphatemia: phosphate repleted again. Will continue to monitor   Hyponatremia: resolved  Intractable nausea and vomiting: resolved  Possible UTI: UA is positive, urine cx likely a containment. Completed abx course  Depression: severity unknown. Continue on home dose of cymbalta  Peripheral neuropathy: will continue on gabapentin   DVT prophylaxis: SCDs Code Status: full Family Communication:  Disposition Plan: depends on PT/OT recs (unable to participate   Status is: Inpatient  Remains inpatient appropriate because: electrolyte disturbances & still on a precedex drip   Dispo: The patient is from: Home              Anticipated d/c is to: SNF vs home health vs home              Anticipated d/c date is: >3 days              Patient currently is not medically stable to d/c.      Consultants:   GI   Procedures:    Antimicrobials: rocephin   Subjective: Pt is  lethargic still. Unchanged from day prior   Objective: Vitals:   11/28/19 0300 11/28/19 0400 11/28/19 0500 11/28/19 0600  BP: 108/69 137/70 132/74 118/69  Pulse: (!) 53 (!) 59 69 (!) 59  Resp: 16 (!) 21 19 17   Temp:      TempSrc:      SpO2: 91% 91% 94% 91%  Weight:      Height:        Intake/Output Summary (Last 24 hours) at 11/28/2019 0833 Last data filed at 11/28/2019 0600 Gross per 24 hour  Intake 2757.05 ml  Output 5250 ml  Net -2492.95 ml   Filed Weights   11/24/19 1805 11/25/19 0534 11/25/19 0820  Weight: 54.2 kg 55.7 kg 55.5 kg    Examination:  General exam: Appears calm and comfortable. Appears older than stated age  Respiratory system: decreased breath sounds b/l. No wheezes Cardiovascular system: S1 & S2 +. No  rubs, gallops or clicks.  Gastrointestinal system: Abdomen is nondistended, soft and NT. Hypoactive bowel sounds heard. Central nervous system: Lethargic. Not able to do a neuro exam Psychiatry: Judgement and insight appear abnormal.     Data Reviewed: I have personally reviewed following labs and imaging studies  CBC: Recent Labs  Lab 11/24/19 0505 11/25/19 1030 11/26/19 0404 11/27/19 0537 11/28/19 0404  WBC 5.4 3.5* 3.2* 4.0 3.1*  HGB 8.8* 8.5* 9.4* 9.0* 10.4*  HCT  26.9* 24.3* 27.5* 26.8* 31.1*  MCV 83.3 78.6* 79.5* 82.5 82.3  PLT 57* 54* 38* 42* 52*   Basic Metabolic Panel: Recent Labs  Lab 11/24/19 0505 11/24/19 1731 11/25/19 1030 11/26/19 0404 11/27/19 0537 11/28/19 0404  NA 132*  --  135 137 138 137  K 2.9*  --  2.6* 3.0* 3.2* 3.3*  CL 101  --  103 106 113* 108  CO2 17*  --  24 23 22 23   GLUCOSE 85  --  112* 123* 116* 123*  BUN 7  --  <5* <5* <5* <5*  CREATININE 0.59  --  0.41* <0.30* <0.30* <0.30*  CALCIUM 7.4*  --  7.6* 7.4* 7.1* 7.8*  MG  --  1.4*  --  1.6* 1.9 1.5*  PHOS  --  1.1*  --  2.2* 1.6* 2.2*   GFR: CrCl cannot be calculated (This lab value cannot be used to calculate CrCl because it is not a number:  <0.30). Liver Function Tests: Recent Labs  Lab 11/23/19 1437 11/25/19 1030 11/26/19 0404 11/27/19 0537 11/28/19 0404  AST 161* 117* 170* 167* 136*  ALT 49* 42 56* 61* 64*  ALKPHOS 86 70 73 68 80  BILITOT 3.7* 3.1* 3.7* 2.4* 2.6*  PROT 7.9 5.5* 5.7* 5.2* 5.8*  ALBUMIN 3.5 2.6* 2.5* 2.2* 2.3*   Recent Labs  Lab 11/23/19 1437  LIPASE 37   No results for input(s): AMMONIA in the last 168 hours. Coagulation Profile: Recent Labs  Lab 11/23/19 1620 11/24/19 0505  INR 1.6* 1.8*   Cardiac Enzymes: No results for input(s): CKTOTAL, CKMB, CKMBINDEX, TROPONINI in the last 168 hours. BNP (last 3 results) No results for input(s): PROBNP in the last 8760 hours. HbA1C: No results for input(s): HGBA1C in the last 72 hours. CBG: Recent Labs  Lab 11/25/19 0821  GLUCAP 104*   Lipid Profile: No results for input(s): CHOL, HDL, LDLCALC, TRIG, CHOLHDL, LDLDIRECT in the last 72 hours. Thyroid Function Tests: No results for input(s): TSH, T4TOTAL, FREET4, T3FREE, THYROIDAB in the last 72 hours. Anemia Panel: No results for input(s): VITAMINB12, FOLATE, FERRITIN, TIBC, IRON, RETICCTPCT in the last 72 hours. Sepsis Labs: Recent Labs  Lab 11/23/19 2300 11/24/19 0341 11/24/19 0505  PROCALCITON  --   --  <0.10  LATICACIDVEN 2.1* 0.7  --     Recent Results (from the past 240 hour(s))  Body fluid culture     Status: None   Collection Time: 11/23/19  8:50 PM   Specimen: KNEE; Body Fluid  Result Value Ref Range Status   Specimen Description   Final    KNEE RIGHT JOINT Performed at [facility], [address] [place], [place] [address]    Special Requests   Final    NONE Performed at [facility], [address] [place], [place] [address]    Gram Stain   Final    MODERATE WBC PRESENT,BOTH PMN AND MONONUCLEAR NO ORGANISMS SEEN    Culture   Final    NO GROWTH 3 DAYS Performed at [facility], [place] [address] [place], [place] [address]    Report  Status 11/27/2019 FINAL  Final  Gram stain     Status: None   Collection Time: 11/23/19  8:50 PM   Specimen: KNEE; Body Fluid  Result Value Ref Range Status   Specimen Description KNEE  Final   Special Requests NONE  Final   Gram Stain   Final    WBC SEEN RED BLOOD  CELLS PRESENT NO ORGANISMS SEEN Performed at [facility], [address] [place], [place] [address]    Report Status 11/23/2019 FINAL  Final  Urine Culture     Status: Abnormal   Collection Time: 11/23/19 10:26 PM   Specimen: Urine, Catheterized  Result Value Ref Range Status   Specimen Description   Final    URINE, CATHETERIZED Performed at [facility], [address] [place], [place] [address]    Special Requests   Final    NONE Performed at [facility], [address] [place], [place] [address]    Culture MULTIPLE SPECIES PRESENT, SUGGEST RECOLLECTION (A)  Final   Report Status 11/25/2019 FINAL  Final  Blood culture (routine x 2)     Status: None   Collection Time: 11/23/19 11:00 PM   Specimen: BLOOD  Result Value Ref Range Status   Specimen Description BLOOD BLOOD RIGHT HAND  Final   Special Requests   Final    BOTTLES DRAWN AEROBIC ONLY Blood Culture adequate volume   Culture   Final    NO GROWTH 5 DAYS Performed at [facility], [address], [place], [place] [address]    Report Status 11/28/2019 FINAL  Final  Blood culture (routine x 2)     Status: None   Collection Time: 11/23/19 11:00 PM   Specimen: BLOOD  Result Value Ref Range Status   Specimen Description BLOOD BLOOD LEFT HAND  Final   Special Requests   Final    BOTTLES DRAWN AEROBIC AND ANAEROBIC Blood Culture adequate volume   Culture   Final    NO GROWTH 5 DAYS Performed at [facility], [address], [place], [place] [address]    Report Status 11/28/2019 FINAL  Final  SARS Coronavirus 2 by RT PCR (hospital order, performed in [place] hospital lab) Nasopharyngeal  Nasopharyngeal Swab     Status: None   Collection Time: 11/24/19  3:30 AM   Specimen: Nasopharyngeal Swab  Result Value Ref Range Status   SARS Coronavirus 2 NEGATIVE NEGATIVE Final    Comment: (NOTE) SARS-CoV-2 target nucleic acids are NOT DETECTED.  The SARS-CoV-2 RNA is generally detectable in upper and lower respiratory specimens during the acute phase of infection. The lowest concentration of SARS-CoV-2 viral copies this assay can detect is 250 copies / mL. A negative result does not preclude SARS-CoV-2 infection and should not be used as the sole basis for treatment or other patient management decisions.  A negative result may occur with improper specimen collection / handling, submission of specimen other than nasopharyngeal swab, presence of viral mutation(s) within the areas targeted by this assay, and inadequate number of viral copies (<250 copies / mL). A negative result must be combined with clinical observations, patient history, and epidemiological information.  Fact Sheet for Patients:   StrictlyIdeas.no  Fact Sheet for Healthcare Providers: BankingDealers.co.za  This test is not yet approved or  cleared by the [place] FDA and has been authorized for detection and/or diagnosis of SARS-CoV-2 by FDA under an Emergency Use Authorization (EUA).  This EUA will remain in effect (meaning this test can be used) for the duration of the COVID-19 declaration under Section 564(b)(1) of the Act, 21 U.S.C. section 360bbb-3(b)(1), unless the authorization is terminated or revoked sooner.  Performed at [facility], [address], [place], [place] [address]   MRSA PCR Screening     Status: None   Collection Time: 11/25/19  8:19 AM  Specimen: Nasal Mucosa; Nasopharyngeal  Result Value Ref Range Status   MRSA by PCR NEGATIVE NEGATIVE Final    Comment:        The GeneXpert MRSA Assay (FDA approved for NASAL  specimens only), is one component of a comprehensive MRSA colonization surveillance program. It is not intended to diagnose MRSA infection nor to guide or monitor treatment for MRSA infections. Performed at [facility], [address], [place], [place] [address]          Radiology Studies: No results found.      Scheduled Meds:  Chlorhexidine Gluconate Cloth  6 each Topical Daily   DULoxetine  20 mg Oral Daily   folic acid  1 mg Oral Daily   LORazepam  0-4 mg Intravenous Q8H   multivitamin with minerals  1 tablet Oral Daily   pantoprazole  40 mg Oral Daily   sodium chloride flush  3 mL Intravenous Once   sucralfate  1 g Oral TID WC & HS   thiamine  100 mg Oral Daily   vitamin B-12  250 mcg Oral Daily   Continuous Infusions:  sodium chloride 100 mL/hr (11/28/19 0750)   dexmedetomidine (PRECEDEX) IV infusion 1.2 mcg/kg/hr (11/28/19 0600)   magnesium sulfate bolus IVPB     potassium chloride     sodium phosphate  Dextrose 5% IVPB       LOS: 4 days    Time spent: 30 mins     Wyvonnia Dusky, MD [facility] Pager [phone]  If 7PM-7AM, please contact night-coverage [email or web address] 11/28/2019, 8:33 AM

## 2019-11-29 DIAGNOSIS — K7030 Alcoholic cirrhosis of liver without ascites: Secondary | ICD-10-CM

## 2019-11-29 DIAGNOSIS — K922 Gastrointestinal hemorrhage, unspecified: Secondary | ICD-10-CM

## 2019-11-29 DIAGNOSIS — F10231 Alcohol dependence with withdrawal delirium: Secondary | ICD-10-CM

## 2019-11-29 DIAGNOSIS — E876 Hypokalemia: Secondary | ICD-10-CM

## 2019-11-29 LAB — MAGNESIUM: Magnesium: 1.6 mg/dL — ABNORMAL LOW (ref 1.7–2.4)

## 2019-11-29 LAB — CBC
HCT: 32.9 % — ABNORMAL LOW (ref 36.0–46.0)
Hemoglobin: 11.5 g/dL — ABNORMAL LOW (ref 12.0–15.0)
MCH: 27.6 pg (ref 26.0–34.0)
MCHC: 35 g/dL (ref 30.0–36.0)
MCV: 78.9 fL — ABNORMAL LOW (ref 80.0–100.0)
Platelets: 65 10*3/uL — ABNORMAL LOW (ref 150–400)
RBC: 4.17 MIL/uL (ref 3.87–5.11)
RDW: 20.1 % — ABNORMAL HIGH (ref 11.5–15.5)
WBC: 3.9 10*3/uL — ABNORMAL LOW (ref 4.0–10.5)
nRBC: 0 % (ref 0.0–0.2)

## 2019-11-29 LAB — COMPREHENSIVE METABOLIC PANEL
ALT: 58 U/L — ABNORMAL HIGH (ref 0–44)
AST: 101 U/L — ABNORMAL HIGH (ref 15–41)
Albumin: 2.5 g/dL — ABNORMAL LOW (ref 3.5–5.0)
Alkaline Phosphatase: 81 U/L (ref 38–126)
Anion gap: 7 (ref 5–15)
BUN: <5 mg/dL — ABNORMAL LOW (ref 6–20)
CO2: 24 mmol/L (ref 22–32)
Calcium: 8.1 mg/dL — ABNORMAL LOW (ref 8.9–10.3)
Chloride: 107 mmol/L (ref 98–111)
Creatinine, Ser: 0.37 mg/dL — ABNORMAL LOW (ref 0.44–1.00)
GFR calc Af Amer: >60 mL/min (ref >60)
GFR calc non Af Amer: >60 mL/min (ref >60)
Glucose, Bld: 107 mg/dL — ABNORMAL HIGH (ref 70–99)
Potassium: 3.3 mmol/L — ABNORMAL LOW (ref 3.5–5.1)
Sodium: 138 mmol/L (ref 135–145)
Total Bilirubin: 2.4 mg/dL — ABNORMAL HIGH (ref 0.3–1.2)
Total Protein: 6.3 g/dL — ABNORMAL LOW (ref 6.5–8.1)

## 2019-11-29 LAB — PHOSPHORUS: Phosphorus: 2.9 mg/dL (ref 2.5–4.6)

## 2019-11-29 MED ORDER — LORAZEPAM 2 MG/ML IJ SOLN
1.0000 mg | INTRAMUSCULAR | Status: DC | PRN
  Administered 2019-11-30 (×3): 1 mg via INTRAVENOUS
  Filled 2019-11-29 (×3): qty 1

## 2019-11-29 MED ORDER — POTASSIUM CHLORIDE IN NACL 20-0.9 MEQ/L-% IV SOLN
INTRAVENOUS | Status: DC
  Filled 2019-11-29 (×7): qty 1000

## 2019-11-29 MED ORDER — SODIUM CHLORIDE 0.9 % IV SOLN
INTRAVENOUS | Status: DC | PRN
  Administered 2019-11-29: 250 mL via INTRAVENOUS

## 2019-11-29 MED ORDER — MAGNESIUM SULFATE 2 GM/50ML IV SOLN
2.0000 g | Freq: Once | INTRAVENOUS | Status: AC
  Administered 2019-11-29: 2 g via INTRAVENOUS
  Filled 2019-11-29: qty 50

## 2019-11-29 NOTE — TOC Initial Note (Signed)
Transition of Care [facility]) - Initial/Assessment Note    Patient Details  Name: Rhonda Martin MRN: [id number] Date of Birth: 04-Mar-1976  Transition of [facility]) CM/SW Contact:    Shelbie Ammons, RN Phone Number: 11/29/2019, 2:32 PM  Clinical Narrative:    RNCM provided patient with SA counseling resources, patient reports that she has previously been given these resources but was appreciative. Patient reports that she still has all equipment in home that she was given at last hospitalization, which includes wheelchair, walker, and a bedside commode. Patient reports that she has a primary doctor and her husband or family takes her to appointments.                Patient Goals and CMS Choice        Expected Discharge Plan and Services                                                Prior Living Arrangements/Services                       Activities of Daily Living Home Assistive Devices/Equipment: Other (Comment) (cane) ADL Screening (condition at time of admission) Patient's cognitive ability adequate to safely complete daily activities?: No Is the patient deaf or have difficulty hearing?: No Does the patient have difficulty seeing, even when wearing glasses/contacts?: No Does the patient have difficulty concentrating, remembering, or making decisions?: No Patient able to express need for assistance with ADLs?: Yes Does the patient have difficulty dressing or bathing?: No Independently performs ADLs?: Yes (appropriate for developmental age) Does the patient have difficulty walking or climbing stairs?: Yes Weakness of Legs: Right Weakness of Arms/Hands: None  Permission Sought/Granted                  Emotional Assessment              Admission diagnosis:  GI bleeding [K92.2] Tachycardia [R00.0] Generalized abdominal pain [R10.84] Effusion of right knee [M25.461] Vomiting, intractability of vomiting not specified, presence of nausea not  specified, unspecified vomiting type [R11.10] Hepatic cirrhosis, unspecified hepatic cirrhosis type, unspecified whether ascites present [facility]) [K74.60] Patient Active Problem List   Diagnosis Date Noted  . Hypomagnesemia   . GI bleeding 11/24/2019  . AKI (acute kidney injury) ([place])   . Elevated bilirubin   . GIB (gastrointestinal bleeding)   . Metabolic acidosis   . Elevated LFTs 10/24/2019  . Hyperbilirubinemia 10/24/2019  . Hematemesis 10/23/2019  . Acute esophagitis   . 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 Neutropenic fever ([place])   . Lobar pneumonia ([place])   . Hepatic encephalopathy ([place]) 08/23/2019  . Hypotension   . Hallucination 08/22/2019  . Acute respiratory failure with hypoxia ([place]) 08/22/2019  . Acute metabolic encephalopathy 48/54/6270  . Acute hepatic encephalopathy 08/22/2019  . Sepsis ([place]) 07/10/2019  . CAP (community acquired pneumonia) 07/10/2019  . Abdominal pain 07/10/2019  . Hyponatremia 07/10/2019  . Hypokalemia 07/10/2019  . Anxiety 07/10/2019  . Chest pain 07/10/2019  . COPD (chronic obstructive pulmonary disease) ([place]) 07/10/2019  . HTN (hypertension) 07/10/2019  . Liver cirrhosis ([place])   . Symptomatic anemia 06/26/2019  . IDA (iron deficiency anemia) 06/26/2019  . Insomnia 04/03/2019  . Tachycardia 04/03/2019  . Acute pain of right knee 03/31/2019  . Bilateral leg edema  02/06/2019  . Generalized anxiety disorder 01/23/2019  . 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 Alcohol withdrawal syndrome ([place]) 01/20/2019  . Chronic hepatitis C without hepatic coma ([place]) 12/20/2018  . Thrombocytopenia ([place]) 12/20/2018  . Alcohol abuse 12/20/2018  . Transaminitis 12/20/2018  . Tobacco abuse 12/20/2018  . Easy bruising 12/20/2018  . Other fatigue 12/20/2018  . Folate deficiency 12/20/2018  . Hepatitis B core antibody negative 12/20/2018  . Charcot-Marie-Tooth disease    PCP:  Volney American, PA-C Pharmacy:   [id number] -  [place], [place] [address] Phone: [phone] Fax: [phone]     Social Determinants of Health (SDOH) Interventions    Readmission Risk Interventions Readmission Risk Prevention Plan 10/25/2019 01/21/2019  Post Dischage Appt - Complete  Medication Screening - Complete  Transportation Screening Complete Complete  PCP or Specialist Appt within 3-5 Days Complete -  HRI or Home Care Consult Complete -  Medication Review (RN Care Manager) Complete -

## 2019-11-29 NOTE — Plan of Care (Signed)

## 2019-11-29 NOTE — Progress Notes (Signed)
Report given to Rhonda Martin on 1c. Pt alert and stable being transferred to 104.

## 2019-11-29 NOTE — Progress Notes (Signed)
Precedex drip stopped in beginning of shift. No prn ativan given per ciwa protocol. Pt alert and oriented, drowsy complaining of pain several times,. Gave prn morphine and oxycodone. Pt ST, on RA with soft BP's no interventions required . Pt had a few visual hallucinations but easily redirected, md made aware in rounding. Pt stable and getting transferred to 2A.

## 2019-11-29 NOTE — Progress Notes (Signed)
   11/29/19 1120  Clinical Encounter Type  Visited With Patient  Visit Type Initial  Referral From Chaplain  Consult/Referral To Chaplain  While rounding chaplain noticed patient was awake and stopped in to visit with her. Patient said she was tired and that she is a fall risk. Patient told chaplain that staff took fluid off her knee. She also said that they are checking for an ulcer. Patient said that she enjoys making others smile and chaplain comment on her smile. Patient was tire and almost falling asleep while chaplain was talking. It was a short but pleasant visit. Chaplain asked if she could pray with her and patient said yes and reached out to hold chaplain's hand. They held hand and chaplain prayed and left.

## 2019-11-29 NOTE — Progress Notes (Signed)
[name], female   DOB: 1975-08-08, 44 y.o.   MRN: [id number] [facility] PROGRESS NOTE  Rhonda Martin MLY:[id number] DOB: 03/27/1976 DOA: 11/23/2019 PCP: Volney American, PA-C  HPI/Subjective: Patient came into the hospital with abdominal pain and vomiting and was admitted for suspected GI bleed and initially placed on IV Protonix. Patient still complains of abdominal pain. Had some vomiting last night. Patient has chronic right knee pain.  Objective: Vitals:   11/29/19 1000 11/29/19 1100  BP: (!) 94/57 97/65  Pulse: 82 73  Resp: 10 15  Temp:    SpO2: 100% 100%    Intake/Output Summary (Last 24 hours) at 11/29/2019 1251 Last data filed at 11/29/2019 1000 Gross per 24 hour  Intake 2942.5 ml  Output 3300 ml  Net -357.5 ml   Filed Weights   11/24/19 1805 11/25/19 0534 11/25/19 0820  Weight: 54.2 kg 55.7 kg 55.5 kg    ROS: Review of Systems  Respiratory: Negative for shortness of breath.   Cardiovascular: Negative for chest pain.  Gastrointestinal: Positive for abdominal pain and nausea. Negative for diarrhea and vomiting.  Musculoskeletal: Positive for joint pain.   Exam: Physical Exam  HENT:  Nose: No mucosal edema.  Mouth/Throat: No oropharyngeal exudate.  Eyes: Pupils are equal, round, and reactive to light. Conjunctivae and lids are normal.  Cardiovascular: S1 normal and S2 normal. Exam reveals no gallop.  No murmur heard. Respiratory: No respiratory distress. She has no wheezes. She has no rhonchi. She has no rales.  GI: Soft. There is abdominal tenderness.  Musculoskeletal:     Right knee: Swelling present. Decreased range of motion.     Right ankle: No swelling.     Left ankle: No swelling.  Neurological: She is alert. No cranial nerve deficit.  Skin: Skin is warm. No rash noted. Nails show no clubbing.      Data Reviewed: Basic Metabolic Panel: Recent Labs  Lab 11/24/19 0505 11/24/19 1731 11/25/19 1030 11/26/19 0404  11/27/19 0537 11/28/19 0404 11/29/19 0647  NA   < >  --  135 137 138 137 138  K   < >  --  2.6* 3.0* 3.2* 3.3* 3.3*  CL   < >  --  103 106 113* 108 107  CO2   < >  --  24 23 22 23 24   GLUCOSE   < >  --  112* 123* 116* 123* 107*  BUN   < >  --  <5* <5* <5* <5* <5*  CREATININE   < >  --  0.41* <0.30* <0.30* <0.30* 0.37*  CALCIUM   < >  --  7.6* 7.4* 7.1* 7.8* 8.1*  MG  --  1.4*  --  1.6* 1.9 1.5* 1.6*  PHOS  --  1.1*  --  2.2* 1.6* 2.2* 2.9   < > = values in this interval not displayed.   Liver Function Tests: Recent Labs  Lab 11/25/19 1030 11/26/19 0404 11/27/19 0537 11/28/19 0404 11/29/19 0647  AST 117* 170* 167* 136* 101*  ALT 42 56* 61* 64* 58*  ALKPHOS 70 73 68 80 81  BILITOT 3.1* 3.7* 2.4* 2.6* 2.4*  PROT 5.5* 5.7* 5.2* 5.8* 6.3*  ALBUMIN 2.6* 2.5* 2.2* 2.3* 2.5*   Recent Labs  Lab 11/23/19 1437  LIPASE 37   CBC: Recent Labs  Lab 11/25/19 1030 11/26/19 0404 11/27/19 0537 11/28/19 0404 11/29/19 0647  WBC 3.5* 3.2* 4.0 3.1* 3.9*  HGB 8.5* 9.4* 9.0* 10.4*  11.5*  HCT 24.3* 27.5* 26.8* 31.1* 32.9*  MCV 78.6* 79.5* 82.5 82.3 78.9*  PLT 54* 38* 42* 52* 65*    CBG: Recent Labs  Lab 11/25/19 0821  GLUCAP 104*    Recent Results (from the past 240 hour(s))  Body fluid culture     Status: None   Collection Time: 11/23/19  8:50 PM   Specimen: KNEE; Body Fluid  Result Value Ref Range Status   Specimen Description   Final    KNEE RIGHT JOINT Performed at [facility], [address], [place], [place] [address]    Special Requests   Final    NONE Performed at [facility], [address] [place], [place] [address]    Gram Stain   Final    MODERATE WBC PRESENT,BOTH PMN AND MONONUCLEAR NO ORGANISMS SEEN    Culture   Final    NO GROWTH 3 DAYS Performed at [facility], [place] [address], [place], [place] [address]    Report Status 11/27/2019 FINAL  Final  Gram stain     Status: None   Collection Time: 11/23/19  8:50 PM    Specimen: KNEE; Body Fluid  Result Value Ref Range Status   Specimen Description KNEE  Final   Special Requests NONE  Final   Gram Stain   Final    WBC SEEN RED BLOOD CELLS PRESENT NO ORGANISMS SEEN Performed at [facility], [address], [place], [place] [address]    Report Status 11/23/2019 FINAL  Final  Urine Culture     Status: Abnormal   Collection Time: 11/23/19 10:26 PM   Specimen: Urine, Catheterized  Result Value Ref Range Status   Specimen Description   Final    URINE, CATHETERIZED Performed at [facility], [address], [place], [place] [address]    Special Requests   Final    NONE Performed at [facility], [address] [place], [place] [address]    Culture MULTIPLE SPECIES PRESENT, SUGGEST RECOLLECTION (A)  Final   Report Status 11/25/2019 FINAL  Final  Blood culture (routine x 2)     Status: None   Collection Time: 11/23/19 11:00 PM   Specimen: BLOOD  Result Value Ref Range Status   Specimen Description BLOOD BLOOD RIGHT HAND  Final   Special Requests   Final    BOTTLES DRAWN AEROBIC ONLY Blood Culture adequate volume   Culture   Final    NO GROWTH 5 DAYS Performed at [facility], [address] [place], [place] [address]    Report Status 11/28/2019 FINAL  Final  Blood culture (routine x 2)     Status: None   Collection Time: 11/23/19 11:00 PM   Specimen: BLOOD  Result Value Ref Range Status   Specimen Description BLOOD BLOOD LEFT HAND  Final   Special Requests   Final    BOTTLES DRAWN AEROBIC AND ANAEROBIC Blood Culture adequate volume   Culture   Final    NO GROWTH 5 DAYS Performed at [facility], [address], [place],  [address]    Report Status 11/28/2019 FINAL  Final  SARS Coronavirus 2 by RT PCR (hospital order, performed in [facility] hospital lab) Nasopharyngeal Nasopharyngeal Swab     Status: None   Collection Time: 11/24/19  3:30 AM   Specimen: Nasopharyngeal Swab   Result Value Ref Range Status   SARS Coronavirus 2 NEGATIVE NEGATIVE Final    Comment: (NOTE) SARS-CoV-2  target nucleic acids are NOT DETECTED.  The SARS-CoV-2 RNA is generally detectable in upper and lower respiratory specimens during the acute phase of infection. The lowest concentration of SARS-CoV-2 viral copies this assay can detect is 250 copies / mL. A negative result does not preclude SARS-CoV-2 infection and should not be used as the sole basis for treatment or other patient management decisions.  A negative result may occur with improper specimen collection / handling, submission of specimen other than nasopharyngeal swab, presence of viral mutation(s) within the areas targeted by this assay, and inadequate number of viral copies (<250 copies / mL). A negative result must be combined with clinical observations, patient history, and epidemiological information.  Fact Sheet for Patients:   StrictlyIdeas.no  Fact Sheet for Healthcare Providers: BankingDealers.co.za  This test is not yet approved or  cleared by the [place] FDA and has been authorized for detection and/or diagnosis of SARS-CoV-2 by FDA under an Emergency Use Authorization (EUA).  This EUA will remain in effect (meaning this test can be used) for the duration of the COVID-19 declaration under Section 564(b)(1) of the Act, 21 U.S.C. section 360bbb-3(b)(1), unless the authorization is terminated or revoked sooner.  Performed at [facility], [address] [place], [place] [address]   MRSA PCR Screening     Status: None   Collection Time: 11/25/19  8:19 AM   Specimen: Nasal Mucosa; Nasopharyngeal  Result Value Ref Range Status   MRSA by PCR NEGATIVE NEGATIVE Final    Comment:        The GeneXpert MRSA Assay (FDA approved for NASAL specimens only), is one component of a comprehensive MRSA colonization surveillance program. It is  not intended to diagnose MRSA infection nor to guide or monitor treatment for MRSA infections. Performed at [facility], [address] [place], [place] [address]      Scheduled Meds:  Chlorhexidine Gluconate Cloth  6 each Topical Daily   DULoxetine  20 mg Oral Daily   folic acid  1 mg Oral Daily   multivitamin with minerals  1 tablet Oral Daily   pantoprazole  40 mg Oral Daily   sodium chloride flush  3 mL Intravenous Once   sucralfate  1 g Oral TID WC & HS   thiamine  100 mg Oral Daily   vitamin B-12  250 mcg Oral Daily   Continuous Infusions:  sodium chloride 250 mL (11/29/19 0827)   0.9 % NaCl with KCl 20 mEq / L 50 mL/hr at 11/29/19 0816   dexmedetomidine (PRECEDEX) IV infusion 1 mcg/kg/hr (11/29/19 0300)    Assessment/Plan:  1. Alcohol withdrawal delerium. Patient taken off Precedex drip. As needed Ativan. 2. GI bleed with recent endoscopy showing esophagitis and dieulafoy lesion. On Protonix. GI following. Also on Carafate. 3. Hypomagnesemia and hypokalemia replace IV magnesium and IV and oral potassium. 4. Alcoholic cirrhosis with pancytopenia 5. Hypophosphatemia was replaced previously 6. Hyponatremia this has resolved 7. Depression on Cymbalta 8. Peripheral neuropathy. 9. Chronic knee pain on oxycodone 10. Weakness. We will get physical therapy evaluation     Code Status:     Code Status Orders  (From admission, onward)         Start     Ordered   11/24/19 0230  Full code  Continuous        11/24/19 0231        Code Status History    Date Active Date Inactive Code Status Order ID Comments User Context  11/24/2019 0231 11/24/2019 0231 Full Code [id number]  Christel Mormon, MD ED   10/23/2019 0438 11/02/2019 1858 Full Code [id number]  Athena Masse, MD ED   08/22/2019 1457 09/01/2019 1628 Full Code [id number]  Ivor Costa, MD ED   07/10/2019 1934 07/17/2019 1851 Full Code [id number]  Ivor Costa, MD ED   02/06/2019 1815 02/08/2019 1805 Full  Code [id number]  Vaughan Basta, MD Inpatient   01/20/2019 1321 01/27/2019 1710 Full Code [id number]  Lang Snow, NP ED   Advance Care Planning Activity     Family Communication: Spoke with husband on the phone Disposition Plan: Status is: Inpatient  Dispo: The patient is from: Home              Anticipated d/c is to: Home              Anticipated d/c date is: Likely will need a few days in the hospital              Patient currently being treated for alcohol withdrawal.  Now off Precedex drip but still needs to be watched here in the hospital a little bit longer.  Consultants:  Gastroenterology  Time spent: 28 minutes  [place]

## 2019-11-30 ENCOUNTER — Inpatient Hospital Stay: Payer: Medicaid Other

## 2019-11-30 DIAGNOSIS — D61818 Other pancytopenia: Secondary | ICD-10-CM

## 2019-11-30 DIAGNOSIS — R509 Fever, unspecified: Secondary | ICD-10-CM

## 2019-11-30 DIAGNOSIS — G8929 Other chronic pain: Secondary | ICD-10-CM

## 2019-11-30 DIAGNOSIS — M25561 Pain in right knee: Secondary | ICD-10-CM

## 2019-11-30 DIAGNOSIS — K7460 Unspecified cirrhosis of liver: Secondary | ICD-10-CM

## 2019-11-30 LAB — COMPREHENSIVE METABOLIC PANEL
ALT: 50 U/L — ABNORMAL HIGH (ref 0–44)
AST: 82 U/L — ABNORMAL HIGH (ref 15–41)
Albumin: 2.7 g/dL — ABNORMAL LOW (ref 3.5–5.0)
Alkaline Phosphatase: 91 U/L (ref 38–126)
Anion gap: 8 (ref 5–15)
BUN: <5 mg/dL — ABNORMAL LOW (ref 6–20)
CO2: 25 mmol/L (ref 22–32)
Calcium: 7.9 mg/dL — ABNORMAL LOW (ref 8.9–10.3)
Chloride: 107 mmol/L (ref 98–111)
Creatinine, Ser: 0.37 mg/dL — ABNORMAL LOW (ref 0.44–1.00)
GFR calc Af Amer: >60 mL/min (ref >60)
GFR calc non Af Amer: >60 mL/min (ref >60)
Glucose, Bld: 87 mg/dL (ref 70–99)
Potassium: 3.1 mmol/L — ABNORMAL LOW (ref 3.5–5.1)
Sodium: 140 mmol/L (ref 135–145)
Total Bilirubin: 2.8 mg/dL — ABNORMAL HIGH (ref 0.3–1.2)
Total Protein: 6.5 g/dL (ref 6.5–8.1)

## 2019-11-30 LAB — MAGNESIUM: Magnesium: 1.5 mg/dL — ABNORMAL LOW (ref 1.7–2.4)

## 2019-11-30 LAB — PROTIME-INR
INR: 1.7 — ABNORMAL HIGH (ref 0.8–1.2)
Prothrombin Time: 19.3 seconds — ABNORMAL HIGH (ref 11.4–15.2)

## 2019-11-30 MED ORDER — SODIUM CHLORIDE 0.9% FLUSH: 10.0000 mL | INTRAVENOUS | Status: DC | PRN

## 2019-11-30 MED ORDER — PHYTONADIONE 5 MG PO TABS
10.0000 mg | ORAL_TABLET | Freq: Once | ORAL | Status: AC
  Administered 2019-11-30: 09:00:00 10 mg via ORAL
  Filled 2019-11-30: qty 2

## 2019-11-30 MED ORDER — POTASSIUM CHLORIDE CRYS ER 20 MEQ PO TBCR
40.0000 meq | EXTENDED_RELEASE_TABLET | Freq: Once | ORAL | Status: AC
  Administered 2019-11-30: 09:00:00 40 meq via ORAL
  Filled 2019-11-30: qty 2

## 2019-11-30 MED ORDER — RISPERIDONE 1 MG PO TABS
1.0000 mg | ORAL_TABLET | Freq: Two times a day (BID) | ORAL | Status: DC
  Administered 2019-11-30 – 2019-12-01 (×3): 1 mg via ORAL
  Filled 2019-11-30 (×4): qty 1

## 2019-11-30 MED ORDER — SODIUM CHLORIDE 0.9% FLUSH
10.0000 mL | Freq: Two times a day (BID) | INTRAVENOUS | Status: DC
  Administered 2019-12-01 – 2019-12-03 (×4): 10 mL

## 2019-11-30 MED ORDER — POTASSIUM CHLORIDE CRYS ER 20 MEQ PO TBCR
20.0000 meq | EXTENDED_RELEASE_TABLET | Freq: Once | ORAL | Status: AC
  Administered 2019-11-30: 20 meq via ORAL
  Filled 2019-11-30: qty 1

## 2019-11-30 MED ORDER — MAGNESIUM SULFATE 2 GM/50ML IV SOLN
2.0000 g | Freq: Once | INTRAVENOUS | Status: AC
  Administered 2019-11-30: 2 g via INTRAVENOUS
  Filled 2019-11-30: qty 50

## 2019-11-30 NOTE — Progress Notes (Signed)
[name], female   DOB: April 05, 1976, 44 y.o.   MRN: [id number] [facility] PROGRESS NOTE  Rhonda Martin DZH:[id number] DOB: 12/12/1975 DOA: 11/23/2019 PCP: Volney American, PA-C  HPI/Subjective: Patient came into the hospital with abdominal pain and vomiting.  Patient still has abdominal pain and knee pain.  Not feeling well.  Feeling a little bit weak.  Had a low-grade fever overnight.  Husband states that the patient still is having some hallucinations.  Objective: Vitals:   11/30/19 0307 11/30/19 1207  BP: (!) 150/88 126/74  Pulse: (!) 106 96  Resp: 17 18  Temp: (!) 100.4 F (38 C) 98.9 F (37.2 C)  SpO2: 99% 97%    Intake/Output Summary (Last 24 hours) at 11/30/2019 1453 Last data filed at 11/30/2019 1114 Gross per 24 hour  Intake 1339.11 ml  Output 2400 ml  Net -1060.89 ml   Filed Weights   11/24/19 1805 11/25/19 0534 11/25/19 0820  Weight: 54.2 kg 55.7 kg 55.5 kg    ROS: Review of Systems  Constitutional: Negative for fever.  Respiratory: Negative for cough and shortness of breath.   Cardiovascular: Negative for chest pain.  Gastrointestinal: Negative for abdominal pain.  Musculoskeletal: Positive for joint pain.   Exam: Physical Exam  HENT:  Nose: No mucosal edema.  Mouth/Throat: No oropharyngeal exudate.  Eyes: Pupils are equal, round, and reactive to light. Conjunctivae and lids are normal.  Cardiovascular: S1 normal and S2 normal. Exam reveals no gallop.  No murmur heard. Pulses:      Dorsalis pedis pulses are 2+ on the right side and 2+ on the left side.  Respiratory: No respiratory distress. She has decreased breath sounds in the right lower field and the left lower field. She has no wheezes. She has no rhonchi. She has no rales.  GI: Soft. There is generalized abdominal tenderness.  Musculoskeletal:     Right knee: Swelling present. Decreased range of motion.     Right ankle: No swelling.     Left ankle: No swelling.   Neurological: She is alert.  Skin: Skin is warm. Nails show no clubbing.      Data Reviewed: Basic Metabolic Panel: Recent Labs  Lab 11/24/19 1731 11/25/19 1030 11/26/19 0404 11/27/19 0537 11/28/19 0404 11/29/19 0647 11/30/19 0423  NA  --    < > 137 138 137 138 140  K  --    < > 3.0* 3.2* 3.3* 3.3* 3.1*  CL  --    < > 106 113* 108 107 107  CO2  --    < > 23 22 23 24 25   GLUCOSE  --    < > 123* 116* 123* 107* 87  BUN  --    < > <5* <5* <5* <5* <5*  CREATININE  --    < > <0.30* <0.30* <0.30* 0.37* 0.37*  CALCIUM  --    < > 7.4* 7.1* 7.8* 8.1* 7.9*  MG 1.4*  --  1.6* 1.9 1.5* 1.6* 1.5*  PHOS 1.1*  --  2.2* 1.6* 2.2* 2.9  --    < > = values in this interval not displayed.   Liver Function Tests: Recent Labs  Lab 11/26/19 0404 11/27/19 0537 11/28/19 0404 11/29/19 0647 11/30/19 0423  AST 170* 167* 136* 101* 82*  ALT 56* 61* 64* 58* 50*  ALKPHOS 73 68 80 81 91  BILITOT 3.7* 2.4* 2.6* 2.4* 2.8*  PROT 5.7* 5.2* 5.8* 6.3* 6.5  ALBUMIN 2.5* 2.2* 2.3*  2.5* 2.7*   CBC: Recent Labs  Lab 11/25/19 1030 11/26/19 0404 11/27/19 0537 11/28/19 0404 11/29/19 0647  WBC 3.5* 3.2* 4.0 3.1* 3.9*  HGB 8.5* 9.4* 9.0* 10.4* 11.5*  HCT 24.3* 27.5* 26.8* 31.1* 32.9*  MCV 78.6* 79.5* 82.5 82.3 78.9*  PLT 54* 38* 42* 52* 65*    CBG: Recent Labs  Lab 11/25/19 0821  GLUCAP 104*    Recent Results (from the past 240 hour(s))  Body fluid culture     Status: None   Collection Time: 11/23/19  8:50 PM   Specimen: KNEE; Body Fluid  Result Value Ref Range Status   Specimen Description   Final    KNEE RIGHT JOINT Performed at [facility], [address] [place], [place] [address]    Special Requests   Final    NONE Performed at [facility], [address] [place], [place] [address]    Gram Stain   Final    MODERATE WBC PRESENT,BOTH PMN AND MONONUCLEAR NO ORGANISMS SEEN    Culture   Final    NO GROWTH 3 DAYS Performed at [facility], [place] [address] [place], [place] [address]    Report Status 11/27/2019 FINAL  Final  Gram stain     Status: None   Collection Time: 11/23/19  8:50 PM   Specimen: KNEE; Body Fluid  Result Value Ref Range Status   Specimen Description KNEE  Final   Special Requests NONE  Final   Gram Stain   Final    WBC SEEN RED BLOOD CELLS PRESENT NO ORGANISMS SEEN Performed at [facility], [address], [place], [place] [address]    Report Status 11/23/2019 FINAL  Final  Urine Culture     Status: Abnormal   Collection Time: 11/23/19 10:26 PM   Specimen: Urine, Catheterized  Result Value Ref Range Status   Specimen Description   Final    URINE, CATHETERIZED Performed at [facility], [address], [place], [place] [address]    Special Requests   Final    NONE Performed at [facility], [address] [place], [place] [address]    Culture MULTIPLE SPECIES PRESENT, SUGGEST RECOLLECTION (A)  Final   Report Status 11/25/2019 FINAL  Final  Blood culture (routine x 2)     Status: None   Collection Time: 11/23/19 11:00 PM   Specimen: BLOOD  Result Value Ref Range Status   Specimen Description BLOOD BLOOD RIGHT HAND  Final   Special Requests   Final    BOTTLES DRAWN AEROBIC ONLY Blood Culture adequate volume   Culture   Final    NO GROWTH 5 DAYS Performed at [facility], [address] [place], [place] [address]    Report Status 11/28/2019 FINAL  Final  Blood culture (routine x 2)     Status: None   Collection Time: 11/23/19 11:00 PM   Specimen: BLOOD  Result Value Ref Range Status   Specimen Description BLOOD BLOOD LEFT HAND  Final   Special Requests   Final    BOTTLES DRAWN AEROBIC AND ANAEROBIC Blood Culture adequate volume   Culture   Final    NO GROWTH 5 DAYS Performed at [facility], [address], [place], [place] [address]    Report Status 11/28/2019 FINAL  Final  SARS Coronavirus 2 by RT PCR (hospital order, performed in [facility] hospital lab) Nasopharyngeal Nasopharyngeal Swab     Status: None  Collection Time: 11/24/19  3:30 AM   Specimen: Nasopharyngeal Swab  Result Value Ref Range Status   SARS Coronavirus 2 NEGATIVE NEGATIVE Final    Comment: (NOTE) SARS-CoV-2 target nucleic acids are NOT DETECTED.  The SARS-CoV-2 RNA is generally detectable in upper and lower respiratory specimens during the acute phase of infection. The lowest concentration of SARS-CoV-2 viral copies this assay can detect is 250 copies / mL. A negative result does not preclude SARS-CoV-2 infection and should not be used as the sole basis for treatment or other patient management decisions.  A negative result may occur with improper specimen collection / handling, submission of specimen other than nasopharyngeal swab, presence of viral mutation(s) within the areas targeted by this assay, and inadequate number of viral copies (<250 copies / mL). A negative result must be combined with clinical observations, patient history, and epidemiological information.  Fact Sheet for Patients:   StrictlyIdeas.no  Fact Sheet for Healthcare Providers: BankingDealers.co.za  This test is not yet approved or  cleared by the [place] FDA and has been authorized for detection and/or diagnosis of SARS-CoV-2 by FDA under an Emergency Use Authorization (EUA).  This EUA will remain in effect (meaning this test can be used) for the duration of the COVID-19 declaration under Section 564(b)(1) of the Act, 21 U.S.C. section 360bbb-3(b)(1), unless the authorization is terminated or revoked sooner.  Performed at [facility], [address] [place], [place] [address]   MRSA PCR Screening     Status: None   Collection Time: 11/25/19  8:19 AM   Specimen: Nasal Mucosa; Nasopharyngeal  Result Value Ref Range Status   MRSA by PCR NEGATIVE NEGATIVE Final    Comment:        The GeneXpert MRSA  Assay (FDA approved for NASAL specimens only), is one component of a comprehensive MRSA colonization surveillance program. It is not intended to diagnose MRSA infection nor to guide or monitor treatment for MRSA infections. Performed at [facility], [address] [place], [place] [address]       Scheduled Meds: . Chlorhexidine Gluconate Cloth  6 each Topical Daily  . DULoxetine  20 mg Oral Daily  . folic acid  1 mg Oral Daily  . multivitamin with minerals  1 tablet Oral Daily  . pantoprazole  40 mg Oral Daily  . risperiDONE  1 mg Oral BID  . sodium chloride flush  3 mL Intravenous Once  . sucralfate  1 g Oral TID WC & HS  . thiamine  100 mg Oral Daily  . vitamin B-12  250 mcg Oral Daily   Continuous Infusions: . sodium chloride Stopped (11/30/19 1037)  . 0.9 % NaCl with KCl 20 mEq / L 50 mL/hr at 11/30/19 1114    Assessment/Plan:  1. Alcohol withdrawal delirium.  Patient received Precedex drip treatment while here and has now completed Ativan.  Since the patient having hallucinations as per husband I will start on low-dose Risperdal. 2. GI bleed with recent endoscopy showing esophagitis and dieulafoy lesion.  3. Hypomagnesemia and hypokalemia.  Replace magnesium IV today oral potassium and potassium and IV fluids 4. Alcoholic cirrhosis with pancytopenia. 5. Low-grade fever early morning.  We will get a chest x-ray and urine analysis and urine culture.  Since temperature normal now we will hold off on antibiotics at this point 6. Depression on Cymbalta 7. Peripheral neuropathy 8. Chronic knee pain on oxycodone 9. Weakness.  Continue physical therapy evaluation.  Code Status:     Code Status Orders  (From admission, onward)         Start     Ordered   11/24/19 0230  Full code  Continuous        11/24/19 0231        Code Status History    Date Active Date Inactive Code Status Order ID Comments User Context   11/24/2019 0231 11/24/2019 0231  Full Code [id number]  Mansy, Arvella Merles, MD ED   10/23/2019 0438 11/02/2019 1858 Full Code [id number]  Athena Masse, MD ED   08/22/2019 1457 09/01/2019 1628 Full Code [id number]  Ivor Costa, MD ED   07/10/2019 1934 07/17/2019 1851 Full Code [id number]  Ivor Costa, MD ED   02/06/2019 1815 02/08/2019 1805 Full Code [id number]  Vaughan Basta, MD Inpatient   01/20/2019 1321 01/27/2019 1710 Full Code [id number]  Lang Snow, NP ED   Advance Care Planning Activity     Family Communication: Husband outside the room twice Disposition Plan: Status is: Inpatient  Dispo: The patient is from: Home              Anticipated d/c is to: Home with home health              Anticipated d/c date is: Dependent on when hallucinations settle down and mental status gets better              Patient currently being treated for alcohol withdrawal delirium and hallucinations and currently still requires inpatient hospital stay.  Time spent: 28 minutes  [place]

## 2019-11-30 NOTE — Evaluation (Signed)
Physical Therapy Evaluation Patient Details Name: Rhonda Martin MRN: [id number] DOB: 1975-11-11 Today's Date: 11/30/2019   History of Present Illness  Pt admitted for GI bleed with complaints of abdominal pain and emesis. Pt with history includes liver cirrhosis, alcohol abuse, HTN, COPD, Charcot-Marie Tooth disease, and chronic hep C. Pt reports chronic falls with R knee pain.  Clinical Impression  Pt is a pleasant 44 year old female who was admitted for GI bleed. Pt performs bed mobility with min assist and transfers with mod assist for a few seconds. Unable to ambulate at this time due to pain. Pt reports she wanted pain meds prior to PT, discussed that she received oxy 1 hr ago to which pt said "well I take that every 15 minutes, I need morphine for this pain." Pt demonstrates deficits with strength/pain/mobility/balance. Pt isn't at baseline level and very high falls risk. Would benefit from skilled PT to address above deficits and promote optimal return to PLOF; recommend transition to STR upon discharge from acute hospitalization.     Follow Up Recommendations SNF    Equipment Recommendations   (TBD at next venue of care)    Recommendations for Other Services       Precautions / Restrictions Precautions Precautions: Fall Restrictions Weight Bearing Restrictions: No      Mobility  Bed Mobility Overal bed mobility: Needs Assistance Bed Mobility: Supine to Sit     Supine to sit: Min assist     General bed mobility comments: needs assist for transition to EOB. Very painful and limited motion with R knee with increased pain with flexion.   Transfers Overall transfer level: Needs assistance Equipment used: 1 person hand held assist Transfers: Sit to/from Stand Sit to Stand: Mod assist         General transfer comment: needs assist to stand, pain limited. Pt crying in pain once standing reporting 10/10 pain. Only able to stand for a few seconds prior to needing to  return back to bed  Ambulation/Gait             General Gait Details: unable at this time  Stairs            Wheelchair Mobility    Modified Rankin (Stroke Patients Only)       Balance Overall balance assessment: Needs assistance;History of Falls Sitting-balance support: No upper extremity supported;Feet supported Sitting balance-Leahy Scale: Fair     Standing balance support: Single extremity supported Standing balance-Leahy Scale: Poor                               Pertinent Vitals/Pain Pain Assessment: 0-10 Pain Score: 7  Pain Location: chronic R knee and back pain Pain Descriptors / Indicators: Aching;Sore Pain Intervention(s): Limited activity within patient's tolerance;Premedicated before session    [place] expects to be discharged to:: Private residence Living Arrangements: Spouse/significant other Available Help at Discharge: Family;Available 24 hours/day Type of Home: Mobile home Home Access: Stairs to enter Entrance Stairs-Rails: Left Entrance Stairs-Number of Steps: 4 Home Layout: One level Home Equipment: Clinical cytogeneticist - 2 wheels;Cane - single point;Wheelchair - manual      Prior Function Level of Independence: Independent with assistive device(s)         Comments: Ambulates with cane vs RW depending on how she is feeling.  H/o multiple falls.     Hand Dominance   Dominant Hand: Right    Extremity/Trunk Assessment  Upper Extremity Assessment Upper Extremity Assessment: Overall WFL for tasks assessed    Lower Extremity Assessment Lower Extremity Assessment: Generalized weakness (R LE grossly 3/5; pain; L LE grossly 3+/5)       Communication   Communication: No difficulties  Cognition Arousal/Alertness: Awake/alert (slightly sleepy) Behavior During Therapy: WFL for tasks assessed/performed Overall Cognitive Status: Within Functional Limits for tasks assessed                                         General Comments      Exercises     Assessment/Plan    PT Assessment Patient needs continued PT services  PT Problem List Decreased strength;Decreased activity tolerance;Decreased balance;Decreased mobility;Pain       PT Treatment Interventions DME instruction;Gait training;Stair training;Functional mobility training;Therapeutic activities;Therapeutic exercise;Balance training;Patient/family education    PT Goals (Current goals can be found in the Care Plan section)  Acute Rehab PT Goals Patient Stated Goal: to go to SNF PT Goal Formulation: With patient Time For Goal Achievement: 12/14/19 Potential to Achieve Goals: Good    Frequency Min 2X/week   Barriers to discharge        Co-evaluation               AM-PAC PT "6 Clicks" Mobility  Outcome Measure Help needed turning from your back to your side while in a flat bed without using bedrails?: A Little Help needed moving from lying on your back to sitting on the side of a flat bed without using bedrails?: A Little Help needed moving to and from a bed to a chair (including a wheelchair)?: A Lot Help needed standing up from a chair using your arms (e.g., wheelchair or bedside chair)?: A Lot Help needed to walk in hospital room?: Total Help needed climbing 3-5 steps with a railing? : Total 6 Click Score: 12    End of Session   Activity Tolerance: Patient limited by pain Patient left: in bed;with call bell/phone within reach;with bed alarm set Nurse Communication: Mobility status PT Visit Diagnosis: Muscle weakness (generalized) (M62.81);Difficulty in walking, not elsewhere classified (R26.2);Unsteadiness on feet (R26.81);Pain Pain - Right/Left: Right Pain - part of body: Knee    Time: 6283-1517 PT Time Calculation (min) (ACUTE ONLY): 18 min   Charges:   PT Evaluation $PT Eval Low Complexity: 1 Low          Greggory Stallion, PT, DPT [phone]   Rhonda Martin 11/30/2019,  3:33 PM

## 2019-11-30 NOTE — Progress Notes (Signed)
   11/30/19 1928  Assess: MEWS Score  Temp 99.5 F (37.5 C)  BP 110/85  Pulse Rate (!) 116  Resp 17  SpO2 99 %  Assess: MEWS Score  MEWS Temp 0  MEWS Systolic 0  MEWS Pulse 2  MEWS RR 0  MEWS LOC 0  MEWS Score 2  MEWS Score Color Yellow  Assess: if the MEWS score is Yellow or Red  Were vital signs taken at a resting state? Yes  Focused Assessment Documented focused assessment  Early Detection of Sepsis Score *See Row Information* Low  MEWS guidelines implemented *See Row Information* No, vital signs rechecked  Treat  MEWS Interventions Administered prn meds/treatments  Take Vital Signs  Increase Vital Sign Frequency  Yellow: Q 2hr X 2 then Q 4hr X 2, if remains yellow, continue Q 4hrs  Escalate  MEWS: Escalate Yellow: discuss with charge nurse/RN and consider discussing with provider and RRT  Notify: Charge Nurse/RN  Name of Charge Nurse/RN Notified Producer, television/film/video  Date Charge Nurse/RN Notified 11/30/19  Time Charge Nurse/RN Notified 2201  Notify: Provider  Notification Reason Other (Comment) (did not call pt c/o pain and  med given)  Notify: Rapid Response  Name of Rapid Response RN Notified  (No need)  Document  Patient Outcome Stabilized after interventions

## 2019-12-01 DIAGNOSIS — R000 Tachycardia, unspecified: Secondary | ICD-10-CM

## 2019-12-01 DIAGNOSIS — I959 Hypotension, unspecified: Secondary | ICD-10-CM

## 2019-12-01 DIAGNOSIS — R443 Hallucinations, unspecified: Secondary | ICD-10-CM

## 2019-12-01 LAB — CBC
HCT: 25.9 % — ABNORMAL LOW (ref 36.0–46.0)
Hemoglobin: 9 g/dL — ABNORMAL LOW (ref 12.0–15.0)
MCH: 27.8 pg (ref 26.0–34.0)
MCHC: 34.7 g/dL (ref 30.0–36.0)
MCV: 79.9 fL — ABNORMAL LOW (ref 80.0–100.0)
Platelets: 63 10*3/uL — ABNORMAL LOW (ref 150–400)
RBC: 3.24 MIL/uL — ABNORMAL LOW (ref 3.87–5.11)
RDW: 20.6 % — ABNORMAL HIGH (ref 11.5–15.5)
WBC: 2.2 10*3/uL — ABNORMAL LOW (ref 4.0–10.5)
nRBC: 0 % (ref 0.0–0.2)

## 2019-12-01 LAB — BASIC METABOLIC PANEL
Anion gap: 10 (ref 5–15)
BUN: <5 mg/dL — ABNORMAL LOW (ref 6–20)
CO2: 23 mmol/L (ref 22–32)
Calcium: 7.9 mg/dL — ABNORMAL LOW (ref 8.9–10.3)
Chloride: 109 mmol/L (ref 98–111)
Creatinine, Ser: 0.37 mg/dL — ABNORMAL LOW (ref 0.44–1.00)
GFR calc Af Amer: >60 mL/min (ref >60)
GFR calc non Af Amer: >60 mL/min (ref >60)
Glucose, Bld: 102 mg/dL — ABNORMAL HIGH (ref 70–99)
Potassium: 3.6 mmol/L (ref 3.5–5.1)
Sodium: 142 mmol/L (ref 135–145)

## 2019-12-01 LAB — MAGNESIUM: Magnesium: 1.7 mg/dL (ref 1.7–2.4)

## 2019-12-01 LAB — PHOSPHORUS: Phosphorus: 2.7 mg/dL (ref 2.5–4.6)

## 2019-12-01 MED ORDER — LORAZEPAM 2 MG/ML IJ SOLN
1.0000 mg | Freq: Three times a day (TID) | INTRAMUSCULAR | Status: DC | PRN
  Administered 2019-12-02 (×2): 1 mg via INTRAVENOUS
  Filled 2019-12-01 (×2): qty 1

## 2019-12-01 MED ORDER — MAGNESIUM SULFATE 2 GM/50ML IV SOLN
2.0000 g | Freq: Once | INTRAVENOUS | Status: AC
  Administered 2019-12-01: 10:00:00 2 g via INTRAVENOUS
  Filled 2019-12-01: qty 50

## 2019-12-01 MED ORDER — SODIUM CHLORIDE 0.9 % IV BOLUS
500.0000 mL | Freq: Once | INTRAVENOUS | Status: AC
  Administered 2019-12-01: 500 mL via INTRAVENOUS

## 2019-12-01 MED ORDER — QUETIAPINE FUMARATE 25 MG PO TABS
25.0000 mg | ORAL_TABLET | Freq: Every day | ORAL | Status: DC
  Administered 2019-12-01 – 2019-12-02 (×2): 25 mg via ORAL
  Filled 2019-12-01 (×2): qty 1

## 2019-12-01 MED ORDER — MIDODRINE HCL 5 MG PO TABS
5.0000 mg | ORAL_TABLET | Freq: Three times a day (TID) | ORAL | Status: DC
  Administered 2019-12-01: 5 mg via ORAL
  Filled 2019-12-01 (×2): qty 1

## 2019-12-01 MED ORDER — RISPERIDONE 1 MG PO TABS
1.0000 mg | ORAL_TABLET | Freq: Two times a day (BID) | ORAL | Status: DC
  Administered 2019-12-02: 1 mg via ORAL
  Filled 2019-12-01 (×2): qty 1

## 2019-12-01 NOTE — Progress Notes (Signed)
[name], female   DOB: 05-21-1976, 44 y.o.   MRN: [id number] [facility] PROGRESS NOTE  Rhonda Martin [id number]:[id number] DOB: 1975-08-19 DOA: 11/23/2019 PCP: Volney American, PA-C  HPI/Subjective: Patient came into the hospital with abdominal pain and vomiting.  Patient feeling okay.  Patient still having a lot of pain in her abdomen and right knee.  Still feeling weak.  Patient stated she did not sleep last night.  Nursing staff reported to me that the patient tried to ask where she can buy some crack.   The patient also told me a story that her husband's daughter had gotten arrested.  I was able to confirm with the husband today that this was not the case.  Objective: Vitals:   12/01/19 0828 12/01/19 1134  BP: 119/82 (!) 99/51  Pulse: (!) 111 (!) 127  Resp: 15 16  Temp: 98.9 F (37.2 C) 98.3 F (36.8 C)  SpO2: 99% 96%    Intake/Output Summary (Last 24 hours) at 12/01/2019 1517 Last data filed at 12/01/2019 1241 Gross per 24 hour  Intake 710.21 ml  Output 700 ml  Net 10.21 ml   Filed Weights   11/24/19 1805 11/25/19 0534 11/25/19 0820  Weight: 54.2 kg 55.7 kg 55.5 kg    ROS: Review of Systems  Respiratory: Negative for shortness of breath.   Cardiovascular: Negative for chest pain.  Musculoskeletal: Positive for joint pain.   Exam: Physical Exam  HENT:  Head: Normocephalic.  Nose: No mucosal edema.  Mouth/Throat: No oropharyngeal exudate.  Eyes: Pupils are equal, round, and reactive to light. Lids are normal.  Cardiovascular: Regular rhythm, S1 normal, S2 normal and normal heart sounds. Tachycardia present.  Respiratory: She has decreased breath sounds in the right lower field and the left lower field. She has no wheezes. She has no rhonchi. She has no rales.  GI: Soft. There is generalized abdominal tenderness.  Musculoskeletal:     Right knee: Swelling present. Decreased range of motion.     Right lower leg: No swelling.     Left lower  leg: No swelling.  Neurological: She is alert.  Able to move all of her extremities on her own.      Data Reviewed: Basic Metabolic Panel: Recent Labs  Lab 11/26/19 0404 11/26/19 0404 11/27/19 0537 11/28/19 0404 11/29/19 0647 11/30/19 0423 12/01/19 0348  NA 137   < > 138 137 138 140 142  K 3.0*   < > 3.2* 3.3* 3.3* 3.1* 3.6  CL 106   < > 113* 108 107 107 109  CO2 23   < > 22 23 24 25 23   GLUCOSE 123*   < > 116* 123* 107* 87 102*  BUN <5*   < > <5* <5* <5* <5* <5*  CREATININE <0.30*   < > <0.30* <0.30* 0.37* 0.37* 0.37*  CALCIUM 7.4*   < > 7.1* 7.8* 8.1* 7.9* 7.9*  MG 1.6*   < > 1.9 1.5* 1.6* 1.5* 1.7  PHOS 2.2*  --  1.6* 2.2* 2.9  --  2.7   < > = values in this interval not displayed.   Liver Function Tests: Recent Labs  Lab 11/26/19 0404 11/27/19 0537 11/28/19 0404 11/29/19 0647 11/30/19 0423  AST 170* 167* 136* 101* 82*  ALT 56* 61* 64* 58* 50*  ALKPHOS 73 68 80 81 91  BILITOT 3.7* 2.4* 2.6* 2.4* 2.8*  PROT 5.7* 5.2* 5.8* 6.3* 6.5  ALBUMIN 2.5* 2.2* 2.3* 2.5* 2.7*  CBC: Recent Labs  Lab 11/26/19 0404 11/27/19 0537 11/28/19 0404 11/29/19 0647 12/01/19 0348  WBC 3.2* 4.0 3.1* 3.9* 2.2*  HGB 9.4* 9.0* 10.4* 11.5* 9.0*  HCT 27.5* 26.8* 31.1* 32.9* 25.9*  MCV 79.5* 82.5 82.3 78.9* 79.9*  PLT 38* 42* 52* 65* 63*   CBG: Recent Labs  Lab 11/25/19 0821  GLUCAP 104*    Recent Results (from the past 240 hour(s))  Body fluid culture     Status: None   Collection Time: 11/23/19  8:50 PM   Specimen: KNEE; Body Fluid  Result Value Ref Range Status   Specimen Description   Final    KNEE RIGHT JOINT Performed at [facility], [address], [place], [place] [address]    Special Requests   Final    NONE Performed at [facility], [address] [place], [place] [address]    Gram Stain   Final    MODERATE WBC PRESENT,BOTH PMN AND MONONUCLEAR NO ORGANISMS SEEN    Culture   Final    NO GROWTH 3 DAYS Performed at [facility], [place] [address], [place], [place] [address]    Report Status 11/27/2019 FINAL  Final  Gram stain     Status: None   Collection Time: 11/23/19  8:50 PM   Specimen: KNEE; Body Fluid  Result Value Ref Range Status   Specimen Description KNEE  Final   Special Requests NONE  Final   Gram Stain   Final    WBC SEEN RED BLOOD CELLS PRESENT NO ORGANISMS SEEN Performed at [facility], [address], [place], [place] [address]    Report Status 11/23/2019 FINAL  Final  Urine Culture     Status: Abnormal   Collection Time: 11/23/19 10:26 PM   Specimen: Urine, Catheterized  Result Value Ref Range Status   Specimen Description   Final    URINE, CATHETERIZED Performed at [facility], [address], [place], [place] [address]    Special Requests   Final    NONE Performed at [facility], [address] [place], [place] [address]    Culture MULTIPLE SPECIES PRESENT, SUGGEST RECOLLECTION (A)  Final   Report Status 11/25/2019 FINAL  Final  Blood culture (routine x 2)     Status: None   Collection Time: 11/23/19 11:00 PM   Specimen: BLOOD  Result Value Ref Range Status   Specimen Description BLOOD BLOOD RIGHT HAND  Final   Special Requests   Final    BOTTLES DRAWN AEROBIC ONLY Blood Culture adequate volume   Culture   Final    NO GROWTH 5 DAYS Performed at [facility], [address] [place], [place] [address]    Report Status 11/28/2019 FINAL  Final  Blood culture (routine x 2)     Status: None   Collection Time: 11/23/19 11:00 PM   Specimen: BLOOD  Result Value Ref Range Status   Specimen Description BLOOD BLOOD LEFT HAND  Final   Special Requests   Final    BOTTLES DRAWN AEROBIC AND ANAEROBIC Blood Culture adequate volume   Culture   Final    NO GROWTH 5 DAYS Performed at [facility], [address], [place], [place] [address]    Report Status 11/28/2019 FINAL  Final  SARS Coronavirus 2 by RT PCR (hospital order,  performed in [place] hospital lab) Nasopharyngeal Nasopharyngeal Swab     Status: None   Collection Time: 11/24/19  3:30 AM   Specimen: Nasopharyngeal Swab  Result Value Ref Range Status   SARS Coronavirus 2 NEGATIVE NEGATIVE Final    Comment: (NOTE) SARS-CoV-2 target nucleic acids are NOT DETECTED.  The SARS-CoV-2 RNA is generally detectable in upper and lower respiratory specimens during the acute phase of infection. The lowest concentration of SARS-CoV-2 viral copies this assay can detect is 250 copies / mL. A negative result does not preclude SARS-CoV-2 infection and should not be used as the sole basis for treatment or other patient management decisions.  A negative result may occur with improper specimen collection / handling, submission of specimen other than nasopharyngeal swab, presence of viral mutation(s) within the areas targeted by this assay, and inadequate number of viral copies (<250 copies / mL). A negative result must be combined with clinical observations, patient history, and epidemiological information.  Fact Sheet for Patients:   StrictlyIdeas.no  Fact Sheet for Healthcare Providers: BankingDealers.co.za  This test is not yet approved or  cleared by the [place] FDA and has been authorized for detection and/or diagnosis of SARS-CoV-2 by FDA under an Emergency Use Authorization (EUA).  This EUA will remain in effect (meaning this test can be used) for the duration of the COVID-19 declaration under Section 564(b)(1) of the Act, 21 U.S.C. section 360bbb-3(b)(1), unless the authorization is terminated or revoked sooner.  Performed at [facility], [address] [place], [place] [address]   MRSA PCR Screening     Status: None   Collection Time: 11/25/19  8:19 AM   Specimen: Nasal Mucosa; Nasopharyngeal  Result Value Ref Range Status   MRSA by PCR NEGATIVE NEGATIVE Final    Comment:         The GeneXpert MRSA Assay (FDA approved for NASAL specimens only), is one component of a comprehensive MRSA colonization surveillance program. It is not intended to diagnose MRSA infection nor to guide or monitor treatment for MRSA infections. Performed at [facility], [address] [place], [place] [address]      Studies: DG Chest Hurt 1 View  Result Date: 11/30/2019 CLINICAL DATA:  Fevers EXAM: PORTABLE CHEST 1 VIEW COMPARISON:  10/28/2019 FINDINGS: Cardiac shadow is near the upper limits of normal in size. Mild vascular congestion is noted. Bibasilar atelectatic changes are noted. No bony abnormality is seen. IMPRESSION: Mild vascular congestion.  Mild bibasilar atelectasis. Electronically Signed   By: Inez Catalina M.D.   On: 11/30/2019 15:43    Scheduled Meds: . Chlorhexidine Gluconate Cloth  6 each Topical Daily  . DULoxetine  20 mg Oral Daily  . folic acid  1 mg Oral Daily  . midodrine  5 mg Oral TID WC  . multivitamin with minerals  1 tablet Oral Daily  . pantoprazole  40 mg Oral Daily  . QUEtiapine  25 mg Oral QHS  . [START ON 12/02/2019] risperiDONE  1 mg Oral BID WC  . sodium chloride flush  10-40 mL Intracatheter Q12H  . sodium chloride flush  3 mL Intravenous Once  . sucralfate  1 g Oral TID WC & HS  . thiamine  100 mg Oral Daily  . vitamin B-12  250 mcg Oral Daily   Continuous Infusions: . sodium chloride Stopped (11/30/19 1037)  . 0.9 % NaCl with KCl 20 mEq / L 50 mL/hr at 12/01/19 1241    Assessment/Plan:  1. Alcohol withdrawal delirium with hallucinations.  Since the patient did not sleep very well last night I will give Seroquel at  night.  Continue Risperdal during the day.  Patient still having hallucinations with what the nurses have reported to me. 2. Hypomagnesemia and hypokalemia.  Magnesium given IV.  Potassium and IV fluids. 3. Hypotension and tachycardia.  Will give standing dose of midodrine.  If heart rate still high tomorrow will add  medications to control heart rate. 4. Alcoholic cirrhosis with pancytopenia 5. Depression on Cymbalta 6. Peripheral neuropathy 7. Chronic right knee pain on oxycodone 8. Weakness.  Physical therapy evaluation appreciated    Code Status:     Code Status Orders  (From admission, onward)         Start     Ordered   11/24/19 0230  Full code  Continuous        11/24/19 0231        Code Status History    Date Active Date Inactive Code Status Order ID Comments User Context   11/24/2019 0231 11/24/2019 0231 Full Code [id number]  Mansy, Arvella Merles, MD ED   10/23/2019 0438 11/02/2019 1858 Full Code [id number]  Athena Masse, MD ED   08/22/2019 1457 09/01/2019 1628 Full Code [id number]  Ivor Costa, MD ED   07/10/2019 1934 07/17/2019 1851 Full Code [id number]  Ivor Costa, MD ED   02/06/2019 1815 02/08/2019 1805 Full Code [id number]  Vaughan Basta, MD Inpatient   01/20/2019 1321 01/27/2019 1710 Full Code [id number]  Lang Snow, NP ED   Advance Care Planning Activity     Family Communication: Spoke with husband on the phone. Disposition Plan: Status is: Inpatient  Dispo: The patient is from: Home              Anticipated d/c is to: SNF              Anticipated d/c date is: Probably will be with [place] through the weekend.              Patient currently having hallucinations with alcohol withdrawal.  The last time I had her in the hospital she took a long time to improve  Time spent: 29 minutes  [place]

## 2019-12-01 NOTE — Progress Notes (Signed)
   12/01/19 0019  Assess: MEWS Score  Temp 99.1 F (37.3 C)  BP (!) 84/51  Pulse Rate (!) 117  Resp 20  Level of Consciousness Alert  O2 Device Room Air  Assess: MEWS Score  MEWS Temp 0  MEWS Systolic 1  MEWS Pulse 2  MEWS RR 0  MEWS LOC 0  MEWS Score 3  MEWS Score Color Yellow  Assess: if the MEWS score is Yellow or Red  Were vital signs taken at a resting state? Yes  Focused Assessment Documented focused assessment  Early Detection of Sepsis Score *See Row Information* Low  MEWS guidelines implemented *See Row Information* No, previously yellow, continue vital signs every 4 hours  Treat  MEWS Interventions Escalated (See documentation below)  Take Vital Signs  Increase Vital Sign Frequency  Yellow: Q 2hr X 2 then Q 4hr X 2, if remains yellow, continue Q 4hrs  Escalate  MEWS: Escalate Yellow: discuss with charge nurse/RN and consider discussing with provider and RRT  Notify: Charge Nurse/RN  Name of Charge Nurse/RN Notified Estill Bamberg RN  Date Charge Nurse/RN Notified 12/01/19  Time Charge Nurse/RN Notified 0029  Notify: Provider  Provider Name/Title Marya Landry  Date Provider Notified 12/01/19  Time Provider Notified 0030  Notification Type  (secure message)  Notification Reason Change in status  Notify: Rapid Response  Name of Rapid Response RN Notified  (No)  Document  Patient Outcome Stabilized after interventions

## 2019-12-02 DIAGNOSIS — R531 Weakness: Secondary | ICD-10-CM

## 2019-12-02 MED ORDER — RISPERIDONE 1 MG PO TABS
1.0000 mg | ORAL_TABLET | Freq: Two times a day (BID) | ORAL | Status: DC | PRN
  Filled 2019-12-02: qty 1

## 2019-12-02 MED ORDER — MIDODRINE HCL 5 MG PO TABS: 5.0000 mg | ORAL_TABLET | Freq: Three times a day (TID) | ORAL | Status: DC | PRN

## 2019-12-02 MED ORDER — THIAMINE HCL 100 MG/ML IJ SOLN
500.0000 mg | Freq: Three times a day (TID) | INTRAVENOUS | Status: DC
  Administered 2019-12-02 – 2019-12-04 (×7): 500 mg via INTRAVENOUS
  Filled 2019-12-02 (×10): qty 5

## 2019-12-02 NOTE — Progress Notes (Signed)
   11/30/19 1245  Clinical Encounter Type  Visited With Patient  Visit Type Follow-up  Referral From Chaplain  Consult/Referral To Chaplain  While rounding chaplain stopped in to check on patient. Patient said staff is keeping an eye on her. She wasn't sure whether her move to room 104 was go or not and chaplain told her a move from ICU is and patient finished the sentence "means you are stable." Patient said that she was in pain and need pain med. When leaving, chaplain stopped by nurse's station and told Nurse Cristie Hem about patient's concern. Nurse Cristie Hem said that she would take patient something for pain.

## 2019-12-02 NOTE — Progress Notes (Signed)
PT Cancellation Note  Patient Details Name: Rhonda Martin MRN: [id number] DOB: [birth year]   Cancelled Treatment:    Reason Eval/Treat Not Completed: Patient declined, no reason specified. Patient is vomiting and unable to participate.   [address] [place], [place] DPT 12/02/2019, 2:28 PM

## 2019-12-02 NOTE — Progress Notes (Signed)
[name], female   DOB: 14-Dec-1975, 44 y.o.   MRN: [id number] [facility] PROGRESS NOTE  Rhonda Martin TDV:[id number] DOB: [birth year] DOA: 11/23/2019 PCP: Volney American, PA-C  HPI/Subjective: Patient was admitted to the hospital with abdominal pain and vomiting.  The patient was given Seroquel last night for sleep. This morning very groggy with the Seroquel.  Woke up and then went back to sleep.  Objective: Vitals:   12/01/19 2151 12/02/19 0739  BP: 122/71 (!) 122/91  Pulse: (!) 108 94  Resp: 16 16  Temp: 98.9 F (37.2 C) 98.8 F (37.1 C)  SpO2: 99% 95%    Intake/Output Summary (Last 24 hours) at 12/02/2019 1116 Last data filed at 12/01/2019 1241 Gross per 24 hour  Intake 95.8 ml  Output --  Net 95.8 ml   Filed Weights   11/24/19 1805 11/25/19 0534 11/25/19 0820  Weight: 54.2 kg 55.7 kg 55.5 kg    ROS: Review of Systems  Unable to perform ROS: Acuity of condition   Exam: Physical Exam HENT:     Head: Normocephalic.     Nose: No mucosal edema.  Eyes:     General: Lids are normal.     Pupils: Pupils are equal, round, and reactive to light.  Cardiovascular:     Rate and Rhythm: Normal rate.     Heart sounds: Normal heart sounds, S1 normal and S2 normal.  Pulmonary:     Breath sounds: Examination of the right-lower field reveals decreased breath sounds. Examination of the left-lower field reveals decreased breath sounds. Decreased breath sounds present. No wheezing, rhonchi or rales.  Abdominal:     Palpations: Abdomen is soft.     Tenderness: There is no abdominal tenderness.  Musculoskeletal:     Right knee: Swelling present.     Right ankle: No swelling.     Left ankle: No swelling.  Skin:    General: Skin is warm.  Neurological:     Mental Status: She is lethargic.       Data Reviewed: Basic Metabolic Panel: Recent Labs  Lab 11/26/19 0404 11/26/19 0404 11/27/19 0626 11/28/19 0404 11/29/19 0647 11/30/19 0423  12/01/19 0348  NA 137   < > 138 137 138 140 142  K 3.0*   < > 3.2* 3.3* 3.3* 3.1* 3.6  CL 106   < > 113* 108 107 107 109  CO2 23   < > 22 23 24 25 23   GLUCOSE 123*   < > 116* 123* 107* 87 102*  BUN <5*   < > <5* <5* <5* <5* <5*  CREATININE <0.30*   < > <0.30* <0.30* 0.37* 0.37* 0.37*  CALCIUM 7.4*   < > 7.1* 7.8* 8.1* 7.9* 7.9*  MG 1.6*   < > 1.9 1.5* 1.6* 1.5* 1.7  PHOS 2.2*  --  1.6* 2.2* 2.9  --  2.7   < > = values in this interval not displayed.   Liver Function Tests: Recent Labs  Lab 11/26/19 0404 11/27/19 0537 11/28/19 0404 11/29/19 0647 11/30/19 0423  AST 170* 167* 136* 101* 82*  ALT 56* 61* 64* 58* 50*  ALKPHOS 73 68 80 81 91  BILITOT 3.7* 2.4* 2.6* 2.4* 2.8*  PROT 5.7* 5.2* 5.8* 6.3* 6.5  ALBUMIN 2.5* 2.2* 2.3* 2.5* 2.7*   CBC: Recent Labs  Lab 11/26/19 0404 11/27/19 0537 11/28/19 0404 11/29/19 0647 12/01/19 0348  WBC 3.2* 4.0 3.1* 3.9* 2.2*  HGB 9.4* 9.0* 10.4* 11.5* 9.0*  HCT 27.5* 26.8* 31.1* 32.9* 25.9*  MCV 79.5* 82.5 82.3 78.9* 79.9*  PLT 38* 42* 52* 65* 63*     Recent Results (from the past 240 hour(s))  Body fluid culture     Status: None   Collection Time: 11/23/19  8:50 PM   Specimen: KNEE; Body Fluid  Result Value Ref Range Status   Specimen Description   Final    KNEE RIGHT JOINT Performed at [facility], [address], [place], [place] [address]    Special Requests   Final    NONE Performed at [facility], [address] [place], [place] [address]    Gram Stain   Final    MODERATE WBC PRESENT,BOTH PMN AND MONONUCLEAR NO ORGANISMS SEEN    Culture   Final    NO GROWTH 3 DAYS Performed at [facility], [place] [address] [place], [place] [address]    Report Status 11/27/2019 FINAL  Final  Gram stain     Status: None   Collection Time: 11/23/19  8:50 PM   Specimen: KNEE; Body Fluid  Result Value Ref Range Status   Specimen Description KNEE  Final   Special Requests NONE  Final   Gram Stain   Final     WBC SEEN RED BLOOD CELLS PRESENT NO ORGANISMS SEEN Performed at [facility], [address], [place], [place] [address]    Report Status 11/23/2019 FINAL  Final  Urine Culture     Status: Abnormal   Collection Time: 11/23/19 10:26 PM   Specimen: Urine, Catheterized  Result Value Ref Range Status   Specimen Description   Final    URINE, CATHETERIZED Performed at [facility], [address], [place], [place] [address]    Special Requests   Final    NONE Performed at [facility], [address] [place], [place] [address]    Culture MULTIPLE SPECIES PRESENT, SUGGEST RECOLLECTION (A)  Final   Report Status 11/25/2019 FINAL  Final  Blood culture (routine x 2)     Status: None   Collection Time: 11/23/19 11:00 PM   Specimen: BLOOD  Result Value Ref Range Status   Specimen Description BLOOD BLOOD RIGHT HAND  Final   Special Requests   Final    BOTTLES DRAWN AEROBIC ONLY Blood Culture adequate volume   Culture   Final    NO GROWTH 5 DAYS Performed at [facility], [address], [place], [place] [address]    Report Status 11/28/2019 FINAL  Final  Blood culture (routine x 2)     Status: None   Collection Time: 11/23/19 11:00 PM   Specimen: BLOOD  Result Value Ref Range Status   Specimen Description BLOOD BLOOD LEFT HAND  Final   Special Requests   Final    BOTTLES DRAWN AEROBIC AND ANAEROBIC Blood Culture adequate volume   Culture   Final    NO GROWTH 5 DAYS Performed at [facility], [address], [place], [place] [address]    Report Status 11/28/2019 FINAL  Final  SARS Coronavirus 2 by RT PCR (hospital order, performed in [facility] hospital lab) Nasopharyngeal Nasopharyngeal Swab     Status: None   Collection Time: 11/24/19  3:30 AM   Specimen: Nasopharyngeal Swab  Result Value Ref Range Status   SARS Coronavirus 2 NEGATIVE NEGATIVE Final    Comment: (NOTE) SARS-CoV-2 target nucleic acids are NOT DETECTED.  The  SARS-CoV-2 RNA is generally detectable in  upper and lower respiratory specimens during the acute phase of infection. The lowest concentration of SARS-CoV-2 viral copies this assay can detect is 250 copies / mL. A negative result does not preclude SARS-CoV-2 infection and should not be used as the sole basis for treatment or other patient management decisions.  A negative result may occur with improper specimen collection / handling, submission of specimen other than nasopharyngeal swab, presence of viral mutation(s) within the areas targeted by this assay, and inadequate number of viral copies (<250 copies / mL). A negative result must be combined with clinical observations, patient history, and epidemiological information.  Fact Sheet for Patients:   StrictlyIdeas.no  Fact Sheet for Healthcare Providers: BankingDealers.co.za  This test is not yet approved or  cleared by the [place] FDA and has been authorized for detection and/or diagnosis of SARS-CoV-2 by FDA under an Emergency Use Authorization (EUA).  This EUA will remain in effect (meaning this test can be used) for the duration of the COVID-19 declaration under Section 564(b)(1) of the Act, 21 U.S.C. section 360bbb-3(b)(1), unless the authorization is terminated or revoked sooner.  Performed at [facility], [address] [place], [place] [address]   MRSA PCR Screening     Status: None   Collection Time: 11/25/19  8:19 AM   Specimen: Nasal Mucosa; Nasopharyngeal  Result Value Ref Range Status   MRSA by PCR NEGATIVE NEGATIVE Final    Comment:        The GeneXpert MRSA Assay (FDA approved for NASAL specimens only), is one component of a comprehensive MRSA colonization surveillance program. It is not intended to diagnose MRSA infection nor to guide or monitor treatment for MRSA infections. Performed at [facility], [address]  [place],  [address]      Studies: DG Chest [place] 1 View  Result Date: 11/30/2019 CLINICAL DATA:  Fevers EXAM: PORTABLE CHEST 1 VIEW COMPARISON:  10/28/2019 FINDINGS: Cardiac shadow is near the upper limits of normal in size. Mild vascular congestion is noted. Bibasilar atelectatic changes are noted. No bony abnormality is seen. IMPRESSION: Mild vascular congestion.  Mild bibasilar atelectasis. Electronically Signed   By: Inez Catalina M.D.   On: 11/30/2019 15:43    Scheduled Meds:  Chlorhexidine Gluconate Cloth  6 each Topical Daily   DULoxetine  20 mg Oral Daily   folic acid  1 mg Oral Daily   multivitamin with minerals  1 tablet Oral Daily   pantoprazole  40 mg Oral Daily   QUEtiapine  25 mg Oral QHS   sodium chloride flush  10-40 mL Intracatheter Q12H   sodium chloride flush  3 mL Intravenous Once   sucralfate  1 g Oral TID WC & HS   vitamin B-12  250 mcg Oral Daily   Continuous Infusions:  sodium chloride Stopped (11/30/19 1037)   0.9 % NaCl with KCl 20 mEq / L 50 mL/hr at 12/02/19 0620   thiamine injection 500 mg (12/02/19 1006)    Assessment/Plan:  1. Alcohol withdrawal delirium with hallucinations.  Seroquel at night and as needed risperidone during the day.  Change thiamine to high-dose thiamine just in case Wernicke's encephalopathy. 2. Hypomagnesemia and hypokalemia.  Potassium IV fluids.  Recheck labs tomorrow morning. 3. Hypotension and tachycardia.  Since blood pressure better this morning I will make midodrine as needed.  Heart rate better this morning than it has been. 4. Alcoholic cirrhosis with pancytopenia.  The further away from drinking hopefully the higher the platelet  count will go. 5. Depression on Cymbalta 6. Peripheral neuropathy 7. Chronic right knee pain on oxycodone.  Discontinue IV pain medication 8. Weakness.  Physical therapy recommended rehab  Code Status:     Code Status Orders  (From admission, onward)         Start      Ordered   11/24/19 0230  Full code  Continuous        11/24/19 0231        Code Status History    Date Active Date Inactive Code Status Order ID Comments User Context   11/24/2019 0231 11/24/2019 0231 Full Code [id number]  Mansy, Arvella Merles, MD ED   10/23/2019 0438 11/02/2019 1858 Full Code [id number]  Athena Masse, MD ED   08/22/2019 1457 09/01/2019 1628 Full Code [id number]  Ivor Costa, MD ED   07/10/2019 1934 07/17/2019 1851 Full Code [id number]  Ivor Costa, MD ED   02/06/2019 1815 02/08/2019 1805 Full Code [id number]  Vaughan Basta, MD Inpatient   01/20/2019 1321 01/27/2019 1710 Full Code [id number]  Lang Snow, NP ED   Advance Care Planning Activity     Family Communication: Spoke with husband Nathaneil Canary on the phone Disposition Plan: Status is: Inpatient  Dispo: The patient is from: Home              Anticipated d/c is to: Rehab              Anticipated d/c date is: In a few days out to rehab (potentially 12/04/2019 versus 12/05/2019              Patient currently being treated for acute alcohol withdrawal delirium with hallucinations.  Required Seroquel at night.  Time spent: 26 minutes  [place]

## 2019-12-03 LAB — BASIC METABOLIC PANEL
Anion gap: 6 (ref 5–15)
BUN: 8 mg/dL (ref 6–20)
CO2: 26 mmol/L (ref 22–32)
Calcium: 8.2 mg/dL — ABNORMAL LOW (ref 8.9–10.3)
Chloride: 106 mmol/L (ref 98–111)
Creatinine, Ser: 0.36 mg/dL — ABNORMAL LOW (ref 0.44–1.00)
GFR calc Af Amer: >60 mL/min (ref >60)
GFR calc non Af Amer: >60 mL/min (ref >60)
Glucose, Bld: 103 mg/dL — ABNORMAL HIGH (ref 70–99)
Potassium: 4.2 mmol/L (ref 3.5–5.1)
Sodium: 138 mmol/L (ref 135–145)

## 2019-12-03 LAB — CBC
HCT: 24.6 % — ABNORMAL LOW (ref 36.0–46.0)
Hemoglobin: 8.2 g/dL — ABNORMAL LOW (ref 12.0–15.0)
MCH: 27.5 pg (ref 26.0–34.0)
MCHC: 33.3 g/dL (ref 30.0–36.0)
MCV: 82.6 fL (ref 80.0–100.0)
Platelets: 69 10*3/uL — ABNORMAL LOW (ref 150–400)
RBC: 2.98 MIL/uL — ABNORMAL LOW (ref 3.87–5.11)
RDW: 20.6 % — ABNORMAL HIGH (ref 11.5–15.5)
WBC: 2.4 10*3/uL — ABNORMAL LOW (ref 4.0–10.5)
nRBC: 0 % (ref 0.0–0.2)

## 2019-12-03 LAB — AMMONIA: Ammonia: 45 umol/L — ABNORMAL HIGH (ref 9–35)

## 2019-12-03 LAB — MAGNESIUM: Magnesium: 1.6 mg/dL — ABNORMAL LOW (ref 1.7–2.4)

## 2019-12-03 MED ORDER — METOPROLOL SUCCINATE ER 25 MG PO TB24
12.5000 mg | ORAL_TABLET | Freq: Every day | ORAL | Status: DC
  Administered 2019-12-03 – 2019-12-04 (×2): 12.5 mg via ORAL
  Filled 2019-12-03 (×2): qty 1

## 2019-12-03 MED ORDER — OLANZAPINE 10 MG PO TABS
10.0000 mg | ORAL_TABLET | Freq: Every day | ORAL | Status: DC
  Administered 2019-12-03: 23:00:00 10 mg via ORAL
  Filled 2019-12-03 (×2): qty 1

## 2019-12-03 MED ORDER — MAGNESIUM SULFATE 2 GM/50ML IV SOLN
2.0000 g | Freq: Once | INTRAVENOUS | Status: AC
  Administered 2019-12-03: 14:00:00 2 g via INTRAVENOUS
  Filled 2019-12-03: qty 50

## 2019-12-03 MED ORDER — LACTULOSE 10 GM/15ML PO SOLN
30.0000 g | Freq: Two times a day (BID) | ORAL | Status: DC
  Administered 2019-12-03 – 2019-12-04 (×3): 30 g via ORAL
  Filled 2019-12-03 (×3): qty 60

## 2019-12-03 NOTE — Progress Notes (Signed)
°   12/03/19 1300  Assess: MEWS Score  Pulse Rate (!) 119  Resp 17  Assess: MEWS Score  MEWS Temp 0  MEWS Systolic 0  MEWS Pulse 2  MEWS RR 0  MEWS LOC 0  MEWS Score 2  MEWS Score Color Yellow  Assess: if the MEWS score is Yellow or Red  Were vital signs taken at a resting state? Yes  Focused Assessment Documented focused assessment  Treat  MEWS Interventions Other (Comment) (Dr Leslye Peer aware patient non symptomatic  no new orders )  Take Vital Signs  Increase Vital Sign Frequency  Yellow: Q 2hr X 2 then Q 4hr X 2, if remains yellow, continue Q 4hrs  Escalate  MEWS: Escalate Yellow: discuss with charge nurse/RN and consider discussing with provider and RRT (Asheley Ramerez )  Notify: Charge Nurse/RN  Name of Charge Nurse/RN Notified Doreene Adas   Date Charge Nurse/RN Notified 12/03/19  Time Charge Nurse/RN Notified 53  Notify: Provider  Provider Name/Title Dr Leslye Peer   Date Provider Notified 12/03/19  Time Provider Notified 1311  Notification Type  (text )  Notification Reason Change in status  Response No new orders  Date of Provider Response 12/03/19  Time of Provider Response 1314  Document  Patient Outcome Other (Comment) (stable continue to monitor )

## 2019-12-03 NOTE — Progress Notes (Signed)
[name], female   DOB: [birth year], 44 y.o.   MRN: [id number] [facility] PROGRESS NOTE  MELISA DONOFRIO OHY:[id number] DOB: [birth year] DOA: 11/23/2019 PCP: Volney American, PA-C  HPI/Subjective: Patient feeling a little bit better.  Asking to go up on her pain medications.  Patient always has pain in her right knee.  Patient always has some abdominal pain.  At times she says she is short of breath and has chest discomfort.  Objective: Vitals:   12/03/19 1250 12/03/19 1300  BP: 109/69   Pulse: (!) 122 (!) 119  Resp: 18 17  Temp: 98.6 F (37 C)   SpO2:      Intake/Output Summary (Last 24 hours) at 12/03/2019 1342 Last data filed at 12/02/2019 1827 Gross per 24 hour  Intake 1339.82 ml  Output 1000 ml  Net 339.82 ml   Filed Weights   11/24/19 1805 11/25/19 0534 11/25/19 0820  Weight: 54.2 kg 55.7 kg 55.5 kg    ROS: Review of Systems  Respiratory: Positive for shortness of breath.   Cardiovascular: Positive for chest pain.  Gastrointestinal: Positive for abdominal pain.  Musculoskeletal: Positive for joint pain.   Exam: Physical Exam HENT:     Nose: No mucosal edema.     Mouth/Throat:     Pharynx: No oropharyngeal exudate.  Eyes:     General: Lids are normal.     Pupils: Pupils are equal, round, and reactive to light.  Cardiovascular:     Rate and Rhythm: Tachycardia present.     Heart sounds: Normal heart sounds, S1 normal and S2 normal.  Pulmonary:     Breath sounds: No decreased breath sounds, wheezing, rhonchi or rales.  Abdominal:     Palpations: Abdomen is soft.     Tenderness: There is no abdominal tenderness.  Musculoskeletal:     Right knee: Swelling present.     Right ankle: No swelling.     Left ankle: No swelling.  Skin:    General: Skin is warm.     Findings: No rash.  Neurological:     Mental Status: She is alert.       Data Reviewed: Basic Metabolic Panel: Recent Labs  Lab 11/27/19 0537 11/27/19 0537  11/28/19 0404 11/29/19 9485 11/30/19 0423 12/01/19 0348 12/03/19 0529  NA 138   < > 137 138 140 142 138  K 3.2*   < > 3.3* 3.3* 3.1* 3.6 4.2  CL 113*   < > 108 107 107 109 106  CO2 22   < > 23 24 25 23 26   GLUCOSE 116*   < > 123* 107* 87 102* 103*  BUN <5*   < > <5* <5* <5* <5* 8  CREATININE <0.30*   < > <0.30* 0.37* 0.37* 0.37* 0.36*  CALCIUM 7.1*   < > 7.8* 8.1* 7.9* 7.9* 8.2*  MG 1.9   < > 1.5* 1.6* 1.5* 1.7 1.6*  PHOS 1.6*  --  2.2* 2.9  --  2.7  --    < > = values in this interval not displayed.   Liver Function Tests: Recent Labs  Lab 11/27/19 0537 11/28/19 0404 11/29/19 0647 11/30/19 0423  AST 167* 136* 101* 82*  ALT 61* 64* 58* 50*  ALKPHOS 68 80 81 91  BILITOT 2.4* 2.6* 2.4* 2.8*  PROT 5.2* 5.8* 6.3* 6.5  ALBUMIN 2.2* 2.3* 2.5* 2.7*   CBC: Recent Labs  Lab 11/27/19 4627 11/28/19 0404 11/29/19 0350 12/01/19 0348 12/03/19 0529  WBC 4.0 3.1* 3.9* 2.2* 2.4*  HGB 9.0* 10.4* 11.5* 9.0* 8.2*  HCT 26.8* 31.1* 32.9* 25.9* 24.6*  MCV 82.5 82.3 78.9* 79.9* 82.6  PLT 42* 52* 65* 63* 69*     Recent Results (from the past 240 hour(s))  Body fluid culture     Status: None   Collection Time: 11/23/19  8:50 PM   Specimen: KNEE; Body Fluid  Result Value Ref Range Status   Specimen Description   Final    KNEE RIGHT JOINT Performed at [facility], [address], [place], [place] [address]    Special Requests   Final    NONE Performed at [facility], [address] [place], [place] [address]    Gram Stain   Final    MODERATE WBC PRESENT,BOTH PMN AND MONONUCLEAR NO ORGANISMS SEEN    Culture   Final    NO GROWTH 3 DAYS Performed at [facility], [place] [address] [place], [place] [address]    Report Status 11/27/2019 FINAL  Final  Gram stain     Status: None   Collection Time: 11/23/19  8:50 PM   Specimen: KNEE; Body Fluid  Result Value Ref Range Status   Specimen Description KNEE  Final   Special Requests NONE  Final   Gram  Stain   Final    WBC SEEN RED BLOOD CELLS PRESENT NO ORGANISMS SEEN Performed at [facility], [address] [place], [place] [address]    Report Status 11/23/2019 FINAL  Final  Urine Culture     Status: Abnormal   Collection Time: 11/23/19 10:26 PM   Specimen: Urine, Catheterized  Result Value Ref Range Status   Specimen Description   Final    URINE, CATHETERIZED Performed at [facility], [address] [place], [place] [address]    Special Requests   Final    NONE Performed at [facility], [address] [place], [place] [address]    Culture MULTIPLE SPECIES PRESENT, SUGGEST RECOLLECTION (A)  Final   Report Status 11/25/2019 FINAL  Final  Blood culture (routine x 2)     Status: None   Collection Time: 11/23/19 11:00 PM   Specimen: BLOOD  Result Value Ref Range Status   Specimen Description BLOOD BLOOD RIGHT HAND  Final   Special Requests   Final    BOTTLES DRAWN AEROBIC ONLY Blood Culture adequate volume   Culture   Final    NO GROWTH 5 DAYS Performed at [facility], [address], [place],  [address]    Report Status 11/28/2019 FINAL  Final  Blood culture (routine x 2)     Status: None   Collection Time: 11/23/19 11:00 PM   Specimen: BLOOD  Result Value Ref Range Status   Specimen Description BLOOD BLOOD LEFT HAND  Final   Special Requests   Final    BOTTLES DRAWN AEROBIC AND ANAEROBIC Blood Culture adequate volume   Culture   Final    NO GROWTH 5 DAYS Performed at [facility], [address], [place],  [address]    Report Status 11/28/2019 FINAL  Final  SARS Coronavirus 2 by RT PCR (hospital order, performed in [facility] hospital lab) Nasopharyngeal Nasopharyngeal Swab     Status: None   Collection Time: 11/24/19  3:30 AM   Specimen: Nasopharyngeal Swab  Result Value Ref Range Status   SARS Coronavirus 2 NEGATIVE NEGATIVE Final    Comment: (NOTE) SARS-CoV-2 target  nucleic acids are NOT  DETECTED.  The SARS-CoV-2 RNA is generally detectable in upper and lower respiratory specimens during the acute phase of infection. The lowest concentration of SARS-CoV-2 viral copies this assay can detect is 250 copies / mL. A negative result does not preclude SARS-CoV-2 infection and should not be used as the sole basis for treatment or other patient management decisions.  A negative result may occur with improper specimen collection / handling, submission of specimen other than nasopharyngeal swab, presence of viral mutation(s) within the areas targeted by this assay, and inadequate number of viral copies (<250 copies / mL). A negative result must be combined with clinical observations, patient history, and epidemiological information.  Fact Sheet for Patients:   StrictlyIdeas.no  Fact Sheet for Healthcare Providers: BankingDealers.co.za  This test is not yet approved or  cleared by the [place] FDA and has been authorized for detection and/or diagnosis of SARS-CoV-2 by FDA under an Emergency Use Authorization (EUA).  This EUA will remain in effect (meaning this test can be used) for the duration of the COVID-19 declaration under Section 564(b)(1) of the Act, 21 U.S.C. section 360bbb-3(b)(1), unless the authorization is terminated or revoked sooner.  Performed at [facility], [address] [place], [place] [address]   MRSA PCR Screening     Status: None   Collection Time: 11/25/19  8:19 AM   Specimen: Nasal Mucosa; Nasopharyngeal  Result Value Ref Range Status   MRSA by PCR NEGATIVE NEGATIVE Final    Comment:        The GeneXpert MRSA Assay (FDA approved for NASAL specimens only), is one component of a comprehensive MRSA colonization surveillance program. It is not intended to diagnose MRSA infection nor to guide or monitor treatment for MRSA infections. Performed at [facility], [address] [place], [place] [address]       Scheduled Meds: . Chlorhexidine Gluconate Cloth  6 each Topical Daily  . DULoxetine  20 mg Oral Daily  . folic acid  1 mg Oral Daily  . lactulose  30 g Oral BID  . metoprolol succinate  12.5 mg Oral Daily  . multivitamin with minerals  1 tablet Oral Daily  . OLANZapine  10 mg Oral QHS  . pantoprazole  40 mg Oral Daily  . sodium chloride flush  10-40 mL Intracatheter Q12H  . sodium chloride flush  3 mL Intravenous Once  . sucralfate  1 g Oral TID WC & HS  . vitamin B-12  250 mcg Oral Daily   Continuous Infusions: . sodium chloride Stopped (11/30/19 1037)  . magnesium sulfate bolus IVPB    . thiamine injection 500 mg (12/03/19 0923)    Assessment/Plan:  1. Alcohol withdrawal delirium with hallucinations.  Patient states that she does not want to use Seroquel at night makes her gain weight.  I will switch Seroquel over to Zyprexa at night.  Giving high-dose thiamine just in case Warnicke's encephalopathy.  Mental status seems better today. 2. Hypomagnesemia and hypokalemia potassium now in normal range.  Give magnesium IV. 3. Hypotension and tachycardia.  Trial of Toprol XL 12.5 mg daily. 4. Alcoholic cirrhosis with pancytopenia.  Patient's ammonia level is also a little high so I will start lactulose.  Goal of 3 bowel movements per day.  Platelet count trending up to 69. 5. Depression on Cymbalta 6. Chronic right knee pain on oxycodone.  I discontinued IV pain medication. 7. Weakness.  Physical therapy recommends rehab. 8. Anemia.  Hemoglobin trending down.  Guaiac stools.  Check hemoglobin again tomorrow and if drops further may need transfusion.  Code Status:     Code Status Orders  (From admission, onward)         Start     Ordered   11/24/19 0230  Full code  Continuous        11/24/19 0231        Code Status History    Date Active Date Inactive Code Status Order ID Comments User Context   11/24/2019 0231 11/24/2019 0231 Full  Code [id number]  Mansy, Arvella Merles, MD ED   10/23/2019 0438 11/02/2019 1858 Full Code [id number]  Athena Masse, MD ED   08/22/2019 1457 09/01/2019 1628 Full Code [id number]  Ivor Costa, MD ED   07/10/2019 1934 07/17/2019 1851 Full Code [id number]  Ivor Costa, MD ED   02/06/2019 1815 02/08/2019 1805 Full Code [id number]  Vaughan Basta, MD Inpatient   01/20/2019 1321 01/27/2019 1710 Full Code [id number]  Lang Snow, NP ED   Advance Care Planning Activity     Family Communication: Spoke with husband Nathaneil Canary on the phone yesterday Disposition Plan: Status is: Inpatient  Dispo: The patient is from: Home              Anticipated d/c is to: Rehab              Anticipated d/c date is: Dependent on whether we get a rehab bed or not.  Potentially 12/04/2019              Patient currently being treated for acute alcohol withdrawal delirium with hallucinations.  Patient's mental status improved today when I saw her.  Reevaluate again tomorrow for potential disposition.  Time spent: 26 minutes  [place]

## 2019-12-04 DIAGNOSIS — D509 Iron deficiency anemia, unspecified: Secondary | ICD-10-CM

## 2019-12-04 LAB — BASIC METABOLIC PANEL
Anion gap: 8 (ref 5–15)
BUN: 7 mg/dL (ref 6–20)
CO2: 24 mmol/L (ref 22–32)
Calcium: 8 mg/dL — ABNORMAL LOW (ref 8.9–10.3)
Chloride: 104 mmol/L (ref 98–111)
Creatinine, Ser: 0.46 mg/dL (ref 0.44–1.00)
GFR calc Af Amer: >60 mL/min (ref >60)
GFR calc non Af Amer: >60 mL/min (ref >60)
Glucose, Bld: 96 mg/dL (ref 70–99)
Potassium: 3.5 mmol/L (ref 3.5–5.1)
Sodium: 136 mmol/L (ref 135–145)

## 2019-12-04 LAB — TYPE AND SCREEN
ABO/RH(D): A POS
Antibody Screen: NEGATIVE

## 2019-12-04 LAB — CBC
HCT: 24.7 % — ABNORMAL LOW (ref 36.0–46.0)
Hemoglobin: 8.5 g/dL — ABNORMAL LOW (ref 12.0–15.0)
MCH: 28 pg (ref 26.0–34.0)
MCHC: 34.4 g/dL (ref 30.0–36.0)
MCV: 81.3 fL (ref 80.0–100.0)
Platelets: 74 10*3/uL — ABNORMAL LOW (ref 150–400)
RBC: 3.04 MIL/uL — ABNORMAL LOW (ref 3.87–5.11)
RDW: 20.1 % — ABNORMAL HIGH (ref 11.5–15.5)
WBC: 2.7 10*3/uL — ABNORMAL LOW (ref 4.0–10.5)
nRBC: 0 % (ref 0.0–0.2)

## 2019-12-04 LAB — MAGNESIUM: Magnesium: 1.8 mg/dL (ref 1.7–2.4)

## 2019-12-04 MED ORDER — TRIAMCINOLONE ACETONIDE 0.1 % EX CREA
1.0000 | TOPICAL_CREAM | Freq: Two times a day (BID) | CUTANEOUS | 0 refills | Status: DC | PRN
Start: 2019-12-04 — End: 2020-08-25

## 2019-12-04 MED ORDER — FOLIC ACID 1 MG PO TABS: 1.0000 mg | ORAL_TABLET | Freq: Every day | ORAL | 0 refills | Status: DC

## 2019-12-04 MED ORDER — ONDANSETRON HCL 4 MG PO TABS: 4.0000 mg | ORAL_TABLET | Freq: Four times a day (QID) | ORAL | 0 refills | Status: DC | PRN

## 2019-12-04 MED ORDER — VITAMIN B-12 500 MCG PO TABS
500.0000 ug | ORAL_TABLET | Freq: Every day | ORAL | 0 refills | Status: DC
Start: 2019-12-04 — End: 2020-08-25

## 2019-12-04 MED ORDER — FERROUS SULFATE 325 (65 FE) MG PO TBEC
325.0000 mg | DELAYED_RELEASE_TABLET | Freq: Every day | ORAL | 0 refills | Status: DC
Start: 2019-12-04 — End: 2020-08-19

## 2019-12-04 MED ORDER — PANTOPRAZOLE SODIUM 40 MG PO TBEC: 40.0000 mg | DELAYED_RELEASE_TABLET | Freq: Every day | ORAL | 0 refills | Status: DC

## 2019-12-04 MED ORDER — MAGNESIUM OXIDE 400 (241.3 MG) MG PO TABS
400.0000 mg | ORAL_TABLET | Freq: Every day | ORAL | Status: DC
  Administered 2019-12-04: 09:00:00 400 mg via ORAL
  Filled 2019-12-04: qty 1

## 2019-12-04 MED ORDER — SUCRALFATE 1 G PO TABS
1.0000 g | ORAL_TABLET | Freq: Three times a day (TID) | ORAL | 0 refills | Status: DC
Start: 2019-12-04 — End: 2020-07-04

## 2019-12-04 MED ORDER — THIAMINE HCL 100 MG PO TABS: 100.0000 mg | ORAL_TABLET | Freq: Every day | ORAL | 0 refills | Status: DC

## 2019-12-04 MED ORDER — DULOXETINE HCL 20 MG PO CPEP: 20.0000 mg | ORAL_CAPSULE | Freq: Every day | ORAL | 0 refills | Status: DC

## 2019-12-04 MED ORDER — DICYCLOMINE HCL 20 MG PO TABS: 20.0000 mg | ORAL_TABLET | Freq: Three times a day (TID) | ORAL | 0 refills | Status: DC

## 2019-12-04 MED ORDER — METOPROLOL SUCCINATE ER 25 MG PO TB24: 12.5000 mg | ORAL_TABLET | Freq: Every day | ORAL | 0 refills | Status: DC

## 2019-12-04 MED ORDER — HYDROXYZINE HCL 25 MG PO TABS: 25.0000 mg | ORAL_TABLET | Freq: Three times a day (TID) | ORAL | 0 refills | Status: DC | PRN

## 2019-12-04 MED ORDER — LACTULOSE 10 GM/15ML PO SOLN: 30.0000 g | Freq: Two times a day (BID) | ORAL | 0 refills | Status: DC

## 2019-12-04 MED ORDER — POTASSIUM CHLORIDE CRYS ER 20 MEQ PO TBCR
20.0000 meq | EXTENDED_RELEASE_TABLET | Freq: Every day | ORAL | Status: DC
  Administered 2019-12-04: 09:00:00 20 meq via ORAL
  Filled 2019-12-04: qty 1

## 2019-12-04 MED ORDER — POTASSIUM CHLORIDE CRYS ER 20 MEQ PO TBCR: 20.0000 meq | EXTENDED_RELEASE_TABLET | Freq: Every day | ORAL | 0 refills | Status: DC

## 2019-12-04 MED ORDER — OLANZAPINE 10 MG PO TABS: 10.0000 mg | ORAL_TABLET | Freq: Every day | ORAL | 0 refills | Status: DC

## 2019-12-04 MED ORDER — GABAPENTIN 300 MG PO CAPS: 300.0000 mg | ORAL_CAPSULE | Freq: Three times a day (TID) | ORAL | 0 refills | Status: DC | PRN

## 2019-12-04 MED ORDER — OXYCODONE HCL 5 MG PO TABS: 5.0000 mg | ORAL_TABLET | Freq: Four times a day (QID) | ORAL | 0 refills | Status: DC | PRN

## 2019-12-04 MED ORDER — MIDODRINE HCL 5 MG PO TABS: 5.0000 mg | ORAL_TABLET | Freq: Two times a day (BID) | ORAL | 0 refills | Status: DC

## 2019-12-04 MED ORDER — MAGNESIUM OXIDE 400 (241.3 MG) MG PO TABS: 400.0000 mg | ORAL_TABLET | Freq: Every day | ORAL | 0 refills | Status: DC

## 2019-12-04 NOTE — TOC Progression Note (Signed)
Transition of Care [facility]) - Progression Note    Patient Details  Name: Rhonda Martin MRN: [id number] Date of Birth: 10/16/1975  Transition of Care [facility]) CM/SW Contact  Shelbie Hutching, RN Phone Number: 12/04/2019, 10:49 AM  Clinical Narrative:    Patient is medically cleared for discharge.  PT has recommended SNF.  This RNCM has discussed this with the patient and patient is interested but she is refusing to give up her SSI/disability.  Explained to patient that SNF will not accept without her check and she reports that is not an option for her.  RNCM explained to patient that she will have to discharge home.  RNCM is attempting to set up home health services.  At this time unable to find an agency to accept, waiting to hear back from [place].  [facility] and [facility] cannot accept.    Expected Discharge Plan: [place] Barriers to Discharge: No [facility] will accept this patient  Expected Discharge Plan and Services Expected Discharge Plan: [place]   Discharge Planning Services: CM Consult Post Acute Care Choice: Standard arrangements for the past 2 months: Single Family Home Expected Discharge Date: 12/04/19                         HH Arranged: PT, Nurse's Aide           Social Determinants of Health (SDOH) Interventions    Readmission Risk Interventions Readmission Risk Prevention Plan 11/29/2019 10/25/2019 01/21/2019  Post Dischage Appt - - Complete  Medication Screening - - Complete  Transportation Screening Complete Complete Complete  PCP or Specialist Appt within 3-5 Days - Complete -  HRI or Home Care Consult - Complete -  Medication Review ([place]) Complete Complete -  PCP or Specialist appointment within 3-5 days of discharge Complete - -  Palliative Care Screening Not Applicable - -  [place] Not Applicable - -

## 2019-12-04 NOTE — Discharge Summary (Signed)
[facility] [place] NAME: Evella Kasal    MR#:  [id number]  DATE OF BIRTH:  [birth year]  DATE OF ADMISSION:  11/23/2019 ADMITTING PHYSICIAN: Christel Mormon, MD  DATE OF DISCHARGE: 12/04/2019  1:59 PM  PRIMARY CARE PHYSICIAN: Volney American, PA-C    ADMISSION DIAGNOSIS:  GI bleeding [K92.2] Tachycardia [R00.0] Generalized abdominal pain [R10.84] Effusion of right knee [M25.461] Vomiting, intractability of vomiting not specified, presence of nausea not specified, unspecified vomiting type [R11.10] Hepatic cirrhosis, unspecified hepatic cirrhosis type, unspecified whether ascites present ([place]) [K74.60]  DISCHARGE DIAGNOSIS:  Active Problems:   Weakness   Alcohol withdrawal delirium (HCC)   Chronic pain of right knee   Hallucinations   GI bleeding   Hypomagnesemia   Fever   Pancytopenia ([place])   SECONDARY DIAGNOSIS:   Past Medical History:  Diagnosis Date  . Alcohol abuse   . Anxiety   . Back injury   . Cervical cancer ([place])   . Charcot-Marie-Tooth disease   . COPD (chronic obstructive pulmonary disease) ([place])   . Family history of adverse reaction to anesthesia    PONV  . GERD (gastroesophageal reflux disease)   . Hepatitis   . Hypertension   . Hypokalemia   . IDA (iron deficiency anemia) 06/26/2019  . Iron deficiency anemia   . Leg injury   . Liver cirrhosis ([place])   . Pneumonia   . Sepsis ([place]) 07/10/2019  . Symptomatic anemia 06/26/2019  . Thrombocytopenia ([place])     HOSPITAL COURSE:   1.  Alcohol withdrawal delirium with hallucinations.  Every time the patient comes into the hospital she goes through withdrawal and takes a long time to clear.  I did give high-dose thiamine just in case Wernicke's encephalopathy.  Patient did not want to use Seroquel and will try some Zyprexa at night.  Mental status better today and stable for disposition.  Patient did not want to give up her check to go out to a rehab so than her  decision is home. 2.  Hypomagnesemia and hypokalemia.  Patient has been replaced on electrolytes during the hospital course.  We will give magnesium and potassium upon disposition. 3.  Hypotension and tachycardia during the hospital course.  Patient is on midodrine as needed.  I did try Toprol-XL low-dose to keep heart rate in a good range. 4.  Alcoholic cirrhosis with pancytopenia.  Patient also has slightly elevated ammonia level hepatic encephalopathy could be contributing I did start lactulose.  Goal of 3 bowel movements a day.  Platelet count trending up to 74. 5.  Depression on Cymbalta 6.  Chronic right knee pain on oxycodone.  I am only allowed to give a short course of pain medications.  Patient will be given 3 days worth.  Will follow up with her medical doctor and pain management as outpatient. 7.  Anemia.  Hemoglobin on the lower side but stable at 8.5.  Continue ferrous sulfate as outpatient.  Patient high risk for alcohol and substance abuse.  Patient high risk for readmission.  Unable to find the home health company that would see her.  Outpatient PT ordered.  DISCHARGE CONDITIONS:   [place]   DRUG ALLERGIES:  No Known Allergies  DISCHARGE MEDICATIONS:   Allergies as of 12/04/2019   No Known Allergies     Medication List    STOP taking these medications   furosemide 20 MG tablet Commonly known as: Lasix   spironolactone 25 MG tablet  Commonly known as: Aldactone   zolpidem 10 MG tablet Commonly known as: AMBIEN     TAKE these medications   Blood Pressure Monitor Misc For automatic blood pressure cuff.   dicyclomine 20 MG tablet Commonly known as: BENTYL Take 1 tablet (20 mg total) by mouth 4 (four) times daily -  before meals and at bedtime. What changed:   when to take this  reasons to take this   DULoxetine 20 MG capsule Commonly known as: CYMBALTA Take 1 capsule (20 mg total) by mouth daily.   ferrous sulfate 325 (65 FE) MG EC tablet Take 1 tablet  (325 mg total) by mouth daily with breakfast. What changed: when to take this   folic acid 1 MG tablet Commonly known as: FOLVITE Take 1 tablet (1 mg total) by mouth daily.   gabapentin 300 MG capsule Commonly known as: NEURONTIN Take 1 capsule (300 mg total) by mouth 3 (three) times daily as needed (pain.).   hydrOXYzine 25 MG tablet Commonly known as: ATARAX/VISTARIL Take 1 tablet (25 mg total) by mouth 3 (three) times daily as needed (anxiety.).   lactulose 10 GM/15ML solution Commonly known as: CHRONULAC Take 45 mLs (30 g total) by mouth 2 (two) times daily.   magnesium oxide 400 (241.3 Mg) MG tablet Commonly known as: MAG-OX Take 1 tablet (400 mg total) by mouth daily. Start taking on: December 05, 2019   metoprolol succinate 25 MG 24 hr tablet Commonly known as: TOPROL-XL Take 0.5 tablets (12.5 mg total) by mouth daily. Start taking on: December 05, 2019   midodrine 5 MG tablet Commonly known as: PROAMATINE Take 1 tablet (5 mg total) by mouth in the morning and at bedtime. Pt is taking as needed bp is good   multivitamin with minerals Tabs tablet Take 1 tablet by mouth daily.   OLANZapine 10 MG tablet Commonly known as: ZYPREXA Take 1 tablet (10 mg total) by mouth at bedtime.   ondansetron 4 MG tablet Commonly known as: ZOFRAN Take 1 tablet (4 mg total) by mouth every 6 (six) hours as needed for nausea.   oxyCODONE 5 MG immediate release tablet Commonly known as: Oxy IR/ROXICODONE Take 1 tablet (5 mg total) by mouth every 6 (six) hours as needed for moderate pain or severe pain. What changed: when to take this   pantoprazole 40 MG tablet Commonly known as: PROTONIX Take 1 tablet (40 mg total) by mouth daily. What changed: when to take this   potassium chloride SA 20 MEQ tablet Commonly known as: KLOR-CON Take 1 tablet (20 mEq total) by mouth daily. Start taking on: December 05, 2019   sucralfate 1 g tablet Commonly known as: Carafate Take 1 tablet (1 g total) by  mouth 4 (four) times daily -  with meals and at bedtime.   thiamine 100 MG tablet Take 1 tablet (100 mg total) by mouth daily.   triamcinolone cream 0.1 % Commonly known as: KENALOG Apply 1 application topically 2 (two) times daily as needed (skin irritation).   vitamin B-12 500 MCG tablet Commonly known as: CYANOCOBALAMIN Take 1 tablet (500 mcg total) by mouth daily. What changed:   medication strength  how much to take        DISCHARGE INSTRUCTIONS:   Follow-up PMD 3 days Follow-up with your pain management doctor  If you experience worsening of your admission symptoms, develop shortness of breath, life threatening emergency, suicidal or homicidal thoughts you must seek medical attention immediately by calling 911 or  calling your MD immediately  if symptoms less severe.  You Must read complete instructions/literature along with all the possible adverse reactions/side effects for all the Medicines you take and that have been prescribed to you. Take any new Medicines after you have completely understood and accept all the possible adverse reactions/side effects.   Please note  You were cared for by a hospitalist during your hospital stay. If you have any questions about your discharge medications or the care you received while you were in the hospital after you are discharged, you can call the unit and asked to speak with the hospitalist on call if the hospitalist that took care of you is not available. Once you are discharged, your primary care physician will handle any further medical issues. Please note that NO REFILLS for any discharge medications will be authorized once you are discharged, as it is imperative that you return to your primary care physician (or establish a relationship with a primary care physician if you do not have one) for your aftercare needs so that they can reassess your need for medications and monitor your lab values.    Today   CHIEF COMPLAINT:    Chief Complaint  Patient presents with  . Abdominal Pain  . Emesis    HISTORY OF PRESENT ILLNESS:  Rhonda Martin  is a 44 y.o. female coming in with abdominal pain and vomiting   VITAL SIGNS:  Blood pressure (!) 101/55, pulse 93, temperature 98.6 F (37 C), temperature source Oral, resp. rate 17, height 5\' 2"  (1.575 m), weight 55.5 kg, SpO2 99 %.  I/O:    Intake/Output Summary (Last 24 hours) at 12/04/2019 1523 Last data filed at 12/04/2019 1026 Gross per 24 hour  Intake 240 ml  Output 2200 ml  Net -1960 ml    PHYSICAL EXAMINATION:  GENERAL:  44 y.o.-year-old patient lying in the bed with no acute distress.  EYES: Pupils equal, round, reactive to light and accommodation. No scleral icterus. HEENT: Head atraumatic, normocephalic. Oropharynx and nasopharynx clear.  LUNGS: Normal breath sounds bilaterally, no wheezing, rales,rhonchi or crepitation. No use of accessory muscles of respiration.  CARDIOVASCULAR: S1, S2 normal. No murmurs, rubs, or gallops.  ABDOMEN: Soft, only has some tenderness in her abdomen. EXTREMITIES: No pedal edema.  Right knee swelling and pain with limited movement NEUROLOGIC: Cranial nerves II through XII are intact. PSYCHIATRIC: The patient is alert and answers questions better today than previously.  SKIN: No obvious rash, lesion, or ulcer.   DATA REVIEW:   CBC Recent Labs  Lab 12/04/19 0331  WBC 2.7*  HGB 8.5*  HCT 24.7*  PLT 74*    Chemistries  Recent Labs  Lab 11/30/19 0423 12/01/19 0348 12/04/19 0331  NA 140   < > 136  K 3.1*   < > 3.5  CL 107   < > 104  CO2 25   < > 24  GLUCOSE 87   < > 96  BUN <5*   < > 7  CREATININE 0.37*   < > 0.46  CALCIUM 7.9*   < > 8.0*  MG 1.5*   < > 1.8  AST 82*  --   --   ALT 50*  --   --   ALKPHOS 91  --   --   BILITOT 2.8*  --   --    < > = values in this interval not displayed.     Microbiology Results  Results for orders placed or performed during  the hospital encounter of 11/23/19   Body fluid culture     Status: None   Collection Time: 11/23/19  8:50 PM   Specimen: KNEE; Body Fluid  Result Value Ref Range Status   Specimen Description   Final    KNEE RIGHT JOINT Performed at [facility], [address], [place], [place] [address]    Special Requests   Final    NONE Performed at [facility], [address] [place], [place] [address]    Gram Stain   Final    MODERATE WBC PRESENT,BOTH PMN AND MONONUCLEAR NO ORGANISMS SEEN    Culture   Final    NO GROWTH 3 DAYS Performed at [facility], [place] [address] [place], [place] [address]    Report Status 11/27/2019 FINAL  Final  Gram stain     Status: None   Collection Time: 11/23/19  8:50 PM   Specimen: KNEE; Body Fluid  Result Value Ref Range Status   Specimen Description KNEE  Final   Special Requests NONE  Final   Gram Stain   Final    WBC SEEN RED BLOOD CELLS PRESENT NO ORGANISMS SEEN Performed at [facility], [address], [place], [place] [address]    Report Status 11/23/2019 FINAL  Final  Urine Culture     Status: Abnormal   Collection Time: 11/23/19 10:26 PM   Specimen: Urine, Catheterized  Result Value Ref Range Status   Specimen Description   Final    URINE, CATHETERIZED Performed at [facility], [address], [place], [place] [address]    Special Requests   Final    NONE Performed at [facility], [address] [place], [place] [address]    Culture MULTIPLE SPECIES PRESENT, SUGGEST RECOLLECTION (A)  Final   Report Status 11/25/2019 FINAL  Final  Blood culture (routine x 2)     Status: None   Collection Time: 11/23/19 11:00 PM   Specimen: BLOOD  Result Value Ref Range Status   Specimen Description BLOOD BLOOD RIGHT HAND  Final   Special Requests   Final    BOTTLES DRAWN AEROBIC ONLY Blood Culture adequate volume   Culture   Final    NO GROWTH 5 DAYS Performed at [facility], [address] [place],  [place] [address]    Report Status 11/28/2019 FINAL  Final  Blood culture (routine x 2)     Status: None   Collection Time: 11/23/19 11:00 PM   Specimen: BLOOD  Result Value Ref Range Status   Specimen Description BLOOD BLOOD LEFT HAND  Final   Special Requests   Final    BOTTLES DRAWN AEROBIC AND ANAEROBIC Blood Culture adequate volume   Culture   Final    NO GROWTH 5 DAYS Performed at [facility], [address], [place], [place] [address]    Report Status 11/28/2019 FINAL  Final  SARS Coronavirus 2 by RT PCR (hospital order, performed in [facility] hospital lab) Nasopharyngeal Nasopharyngeal Swab     Status: None   Collection Time: 11/24/19  3:30 AM   Specimen: Nasopharyngeal Swab  Result Value Ref Range Status   SARS Coronavirus 2 NEGATIVE NEGATIVE Final    Comment: (NOTE) SARS-CoV-2 target nucleic acids are NOT DETECTED.  The SARS-CoV-2 RNA is generally detectable in upper and lower respiratory specimens during the acute phase of infection. The lowest concentration of SARS-CoV-2 viral copies this assay can detect is 250 copies /  mL. A negative result does not preclude SARS-CoV-2 infection and should not be used as the sole basis for treatment or other patient management decisions.  A negative result may occur with improper specimen collection / handling, submission of specimen other than nasopharyngeal swab, presence of viral mutation(s) within the areas targeted by this assay, and inadequate number of viral copies (<250 copies / mL). A negative result must be combined with clinical observations, patient history, and epidemiological information.  Fact Sheet for Patients:   StrictlyIdeas.no  Fact Sheet for Healthcare Providers: BankingDealers.co.za  This test is not yet approved or  cleared by the [place] FDA and has been authorized for detection and/or diagnosis of SARS-CoV-2 by FDA under an Emergency Use  Authorization (EUA).  This EUA will remain in effect (meaning this test can be used) for the duration of the COVID-19 declaration under Section 564(b)(1) of the Act, 21 U.S.C. section 360bbb-3(b)(1), unless the authorization is terminated or revoked sooner.  Performed at [facility], [address] [place], [place] [address]   MRSA PCR Screening     Status: None   Collection Time: 11/25/19  8:19 AM   Specimen: Nasal Mucosa; Nasopharyngeal  Result Value Ref Range Status   MRSA by PCR NEGATIVE NEGATIVE Final    Comment:        The GeneXpert MRSA Assay (FDA approved for NASAL specimens only), is one component of a comprehensive MRSA colonization surveillance program. It is not intended to diagnose MRSA infection nor to guide or monitor treatment for MRSA infections. Performed at [facility], [address], [place], [place] [address]      Management plans discussed with the patient, family and they are in agreement.  CODE STATUS:     Code Status Orders  (From admission, onward)         Start     Ordered   11/24/19 0230  Full code  Continuous        11/24/19 0231        Code Status History    Date Active Date Inactive Code Status Order ID Comments User Context   11/24/2019 0231 11/24/2019 0231 Full Code [id number]  Mansy, Arvella Merles, MD ED   10/23/2019 0438 11/02/2019 1858 Full Code [id number]  Athena Masse, MD ED   08/22/2019 1457 09/01/2019 1628 Full Code [id number]  Ivor Costa, MD ED   07/10/2019 1934 07/17/2019 1851 Full Code [id number]  Ivor Costa, MD ED   02/06/2019 1815 02/08/2019 1805 Full Code [id number]  Vaughan Basta, MD Inpatient   01/20/2019 1321 01/27/2019 1710 Full Code [id number]  Lang Snow, NP ED   Advance Care Planning Activity      TOTAL TIME TAKING CARE OF THIS PATIENT: 35 minutes.    Loletha Grayer M.D on 12/04/2019 at 3:23 PM  Between 7am to 6pm - Pager - [phone]  After 6pm go to [email or web address] - password  [email or web address]  [facility]  CC: Primary care physician; Volney American, PA-C

## 2019-12-04 NOTE — Progress Notes (Signed)
Patient received and understands discharge instructions 

## 2019-12-04 NOTE — Progress Notes (Addendum)
Physical Therapy Treatment Patient Details Name: Rhonda Martin MRN: [id number] DOB: Feb 18, 1976 Today's Date: 12/04/2019    History of Present Illness Pt admitted for GI bleed with complaints of abdominal pain and emesis. Pt with history includes liver cirrhosis, alcohol abuse, HTN, COPD, Charcot-Marie Tooth disease, and chronic hep C. Pt reports chronic falls with R knee pain.    PT Comments    Pt in bed, agrees to session.  Stated she has walked to bathroom with nursing and SPRC/IV pole. Bed mobility without assist.  Steady in sitting.  Stood with [facility] and quickly reaches for IV pole for support.  Encouraged use of RW but she declined stating she uses SPC and objects around home for support but does have a RW in needed.  She is able to walk 2 laps in room and use bathroom with min guard.  No outside assist to correct balance disturbances but she remains a high fall risk given knee pain and deficits in safety awareness.  While SNF remain appropriate and pt has expressed interest in ALF.  Encouraged use of RW at home if she chooses to go home but it is likely she will continue to use [facility].    Follow Up Recommendations  SNF     Equipment Recommendations       Recommendations for Other Services       Precautions / Restrictions Precautions Precautions: Fall    Mobility  Bed Mobility Overal bed mobility: Modified Independent                Transfers Overall transfer level: Needs assistance Equipment used: Straight cane Transfers: Sit to/from Stand Sit to Stand: Supervision;Min guard            Ambulation/Gait Ambulation/Gait assistance: Min guard Gait Distance (Feet): 50 Feet Assistive device: Straight cane;IV Pole Gait Pattern/deviations: Step-to pattern;Decreased step length - right;Decreased step length - left;Decreased stance time - right Gait velocity: decreased       Stairs             Wheelchair Mobility    Modified Rankin (Stroke Patients  Only)       Balance Overall balance assessment: Needs assistance;History of Falls Sitting-balance support: No upper extremity supported;Feet supported Sitting balance-Leahy Scale: Good     Standing balance support: Single extremity supported Standing balance-Leahy Scale: Poor                              Cognition Arousal/Alertness: Awake/alert (slightly sleepy) Behavior During Therapy: WFL for tasks assessed/performed Overall Cognitive Status: Within Functional Limits for tasks assessed                                        Exercises      General Comments        Pertinent Vitals/Pain Pain Assessment: Faces Faces Pain Scale: Hurts even more Pain Location: chronic R knee and back pain Pain Descriptors / Indicators: Aching;Sore Pain Intervention(s): Limited activity within patient's tolerance;Monitored during session    Home Living                      Prior Function            PT Goals (current goals can now be found in the care plan section) Progress towards PT goals: Progressing toward goals  Frequency    Min 2X/week      PT Plan Current plan remains appropriate    Co-evaluation              AM-PAC PT "6 Clicks" Mobility   Outcome Measure  Help needed turning from your back to your side while in a flat bed without using bedrails?: A Little Help needed moving from lying on your back to sitting on the side of a flat bed without using bedrails?: A Little Help needed moving to and from a bed to a chair (including a wheelchair)?: A Little Help needed standing up from a chair using your arms (e.g., wheelchair or bedside chair)?: A Little Help needed to walk in hospital room?: A Little Help needed climbing 3-5 steps with a railing? : Total 6 Click Score: 16    End of Session Equipment Utilized During Treatment: Gait belt Activity Tolerance: Patient limited by pain Patient left: in bed;with call bell/phone  within reach;with bed alarm set Nurse Communication: Mobility status Pain - Right/Left: Right Pain - part of body: Knee     Time: 4536-4680 PT Time Calculation (min) (ACUTE ONLY): 14 min  Charges:  $Gait Training: 8-22 mins                    [name], PTA 12/04/19, 10:40 AM

## 2019-12-04 NOTE — TOC Transition Note (Signed)
Transition of Care [facility]) - CM/SW Discharge Note   Patient Details  Name: Rhonda Martin MRN: [id number] Date of Birth: 04-23-1976  [facility]) CM/SW Contact:  Shelbie Hutching, RN Phone Number: 12/04/2019, 2:01 PM   Clinical Narrative:    This RNCM was unable to find a home health agency to accept the patient due to her insurance.  Patient's husband updated to this fact and agreed to outpatient therapy here at [facility] instead.  OP Therapy referral faxed to therapy department.   Patient's husband will transport her home.    Final next level of care: OP Rehab Barriers to Discharge: No [place] will accept this patient (Patient will do outpatient therapy here at [facility])   Patient Goals and CMS Choice   CMS Medicare.gov Compare Post Acute Care list provided to:: Patient Choice offered to / list presented to : Patient  Discharge Placement                       Discharge Plan and Services   Discharge Planning Services: CM Consult Post Acute Care Choice: Home Health                    HH Arranged: PT, Nurse's Aide          Social Determinants of Health (SDOH) Interventions     Readmission Risk Interventions Readmission Risk Prevention Plan 11/29/2019 10/25/2019 01/21/2019  Post Dischage Appt - - Complete  Medication Screening - - Complete  Transportation Screening Complete Complete Complete  PCP or Specialist Appt within 3-5 Days - Complete -  HRI or Home Care Consult - Complete -  Medication Review ([place]) Complete Complete -  PCP or Specialist appointment within 3-5 days of discharge Complete - -  Palliative Care Screening Not Applicable - -  [place] Not Applicable - -

## 2019-12-06 ENCOUNTER — Encounter: Payer: Self-pay | Admitting: Family Medicine

## 2019-12-06 ENCOUNTER — Telehealth: Payer: Medicaid Other

## 2019-12-07 DIAGNOSIS — Z79899 Other long term (current) drug therapy: Secondary | ICD-10-CM | POA: Diagnosis not present

## 2019-12-10 ENCOUNTER — Emergency Department: Payer: Medicaid Other

## 2019-12-10 ENCOUNTER — Other Ambulatory Visit: Payer: Self-pay

## 2019-12-10 ENCOUNTER — Emergency Department
Admission: EM | Admit: 2019-12-10 | Discharge: 2019-12-10 | Disposition: A | Discharge status: Home / Self Care | Payer: Medicaid Other | Attending: Emergency Medicine | Admitting: Emergency Medicine

## 2019-12-10 ENCOUNTER — Encounter: Payer: Self-pay | Admitting: Emergency Medicine

## 2019-12-10 DIAGNOSIS — Z79899 Other long term (current) drug therapy: Secondary | ICD-10-CM | POA: Insufficient documentation

## 2019-12-10 DIAGNOSIS — F17210 Nicotine dependence, cigarettes, uncomplicated: Secondary | ICD-10-CM | POA: Diagnosis not present

## 2019-12-10 DIAGNOSIS — M7989 Other specified soft tissue disorders: Secondary | ICD-10-CM | POA: Diagnosis not present

## 2019-12-10 DIAGNOSIS — R600 Localized edema: Secondary | ICD-10-CM

## 2019-12-10 DIAGNOSIS — J449 Chronic obstructive pulmonary disease, unspecified: Secondary | ICD-10-CM | POA: Diagnosis not present

## 2019-12-10 DIAGNOSIS — I10 Essential (primary) hypertension: Secondary | ICD-10-CM | POA: Diagnosis not present

## 2019-12-10 LAB — COMPREHENSIVE METABOLIC PANEL
ALT: 35 U/L (ref 0–44)
AST: 91 U/L — ABNORMAL HIGH (ref 15–41)
Albumin: 3 g/dL — ABNORMAL LOW (ref 3.5–5.0)
Alkaline Phosphatase: 77 U/L (ref 38–126)
Anion gap: 7 (ref 5–15)
BUN: 10 mg/dL (ref 6–20)
CO2: 27 mmol/L (ref 22–32)
Calcium: 8.7 mg/dL — ABNORMAL LOW (ref 8.9–10.3)
Chloride: 99 mmol/L (ref 98–111)
Creatinine, Ser: 0.49 mg/dL (ref 0.44–1.00)
GFR calc Af Amer: >60 mL/min (ref >60)
GFR calc non Af Amer: >60 mL/min (ref >60)
Glucose, Bld: 117 mg/dL — ABNORMAL HIGH (ref 70–99)
Potassium: 4.4 mmol/L (ref 3.5–5.1)
Sodium: 133 mmol/L — ABNORMAL LOW (ref 135–145)
Total Bilirubin: 1.3 mg/dL — ABNORMAL HIGH (ref 0.3–1.2)
Total Protein: 7.2 g/dL (ref 6.5–8.1)

## 2019-12-10 LAB — CBC WITH DIFFERENTIAL/PLATELET
Abs Immature Granulocytes: 0.02 10*3/uL (ref 0.00–0.07)
Basophils Absolute: 0.1 10*3/uL (ref 0.0–0.1)
Basophils Relative: 1 %
Eosinophils Absolute: 0.3 10*3/uL (ref 0.0–0.5)
Eosinophils Relative: 4 %
HCT: 27.1 % — ABNORMAL LOW (ref 36.0–46.0)
Hemoglobin: 8.9 g/dL — ABNORMAL LOW (ref 12.0–15.0)
Immature Granulocytes: 0 %
Lymphocytes Relative: 22 %
Lymphs Abs: 1.3 10*3/uL (ref 0.7–4.0)
MCH: 26.9 pg (ref 26.0–34.0)
MCHC: 32.8 g/dL (ref 30.0–36.0)
MCV: 81.9 fL (ref 80.0–100.0)
Monocytes Absolute: 0.8 10*3/uL (ref 0.1–1.0)
Monocytes Relative: 13 %
Neutro Abs: 3.5 10*3/uL (ref 1.7–7.7)
Neutrophils Relative %: 60 %
Platelets: 162 10*3/uL (ref 150–400)
RBC: 3.31 MIL/uL — ABNORMAL LOW (ref 3.87–5.11)
RDW: 21.1 % — ABNORMAL HIGH (ref 11.5–15.5)
Smear Review: NORMAL
WBC: 5.9 10*3/uL (ref 4.0–10.5)
nRBC: 0 % (ref 0.0–0.2)

## 2019-12-10 LAB — BRAIN NATRIURETIC PEPTIDE: B Natriuretic Peptide: 52.5 pg/mL (ref 0.0–100.0)

## 2019-12-10 MED ORDER — CEPHALEXIN 500 MG PO CAPS
500.0000 mg | ORAL_CAPSULE | Freq: Four times a day (QID) | ORAL | 0 refills | Status: AC
Start: 2019-12-10 — End: 2019-12-17

## 2019-12-10 MED ORDER — FUROSEMIDE 10 MG/ML IJ SOLN
40.0000 mg | Freq: Once | INTRAMUSCULAR | Status: AC
  Administered 2019-12-10: 40 mg via INTRAVENOUS
  Filled 2019-12-10: qty 4

## 2019-12-10 MED ORDER — OXYCODONE HCL 5 MG PO TABS
5.0000 mg | ORAL_TABLET | Freq: Once | ORAL | Status: AC
  Administered 2019-12-10: 5 mg via ORAL
  Filled 2019-12-10: qty 1

## 2019-12-10 MED ORDER — TRAMADOL HCL 50 MG PO TABS: 50.0000 mg | ORAL_TABLET | Freq: Two times a day (BID) | ORAL | 0 refills | Status: AC | PRN

## 2019-12-10 MED ORDER — FUROSEMIDE 40 MG PO TABS
40.0000 mg | ORAL_TABLET | Freq: Every day | ORAL | 0 refills | Status: DC
Start: 2019-12-10 — End: 2019-12-27

## 2019-12-10 NOTE — ED Notes (Signed)
US at bedside
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
## 2019-12-10 NOTE — ED Notes (Signed)
E-signature pad unavailable at time of discharge, pt given discharge instructions and paperwork and verbalizes understanding of teaching

## 2019-12-10 NOTE — Discharge Instructions (Addendum)
You have swelling in your legs which is caused by fluid, likely from your liver disease.  We recommend that you take Lasix 40 mg daily for the next 5 days.  However, the Lasix can cause your potassium level to drop so you should not take it for prolonged period.  You should take the Keflex 4 times daily for the next 7 days for the possibility of cellulitis/infection in the legs.  You may take the tramadol up to every 12 hours as needed for more severe pain.  Follow-up with your doctor on Tuesday as scheduled.  Return to the ER for new, worsening, or persistent severe pain, swelling, redness, weakness or numbness, fever or chills, or any other new or worsening symptoms that concern you.

## 2019-12-10 NOTE — ED Triage Notes (Signed)
Pt presents to ED via POV with c/o BLE edema, pt with pitting edema noted to both feet. Pt with brace noted to R leg due to "knee problems". Pt states she thinks she has a blood infection however does not meet sepsis criteria. Pt states swelling started almost as "soon as she was discharged from the hospital".  Pt states is currently taking 2 diuretics and does not think swelling is due to edema.

## 2019-12-10 NOTE — ED Notes (Signed)
This RN apologized and explained delay. VS obtained by this RN.

## 2019-12-12 ENCOUNTER — Inpatient Hospital Stay: Payer: Medicaid Other | Admitting: Family Medicine

## 2019-12-12 ENCOUNTER — Inpatient Hospital Stay: Payer: Medicaid Other | Attending: Oncology

## 2019-12-14 ENCOUNTER — Inpatient Hospital Stay: Payer: Medicaid Other | Admitting: Oncology

## 2019-12-14 ENCOUNTER — Inpatient Hospital Stay: Payer: Medicaid Other | Admitting: Family Medicine

## 2019-12-15 ENCOUNTER — Inpatient Hospital Stay: Payer: Medicaid Other | Admitting: Family Medicine

## 2019-12-25 ENCOUNTER — Encounter: Payer: Self-pay | Admitting: Family Medicine

## 2019-12-26 NOTE — Telephone Encounter (Signed)
Appt scheduled for tomorrow.  °

## 2019-12-27 ENCOUNTER — Encounter: Payer: Self-pay | Admitting: Family Medicine

## 2019-12-27 ENCOUNTER — Encounter: Payer: Self-pay | Admitting: Oncology

## 2019-12-27 ENCOUNTER — Other Ambulatory Visit: Payer: Self-pay

## 2019-12-27 ENCOUNTER — Ambulatory Visit (INDEPENDENT_AMBULATORY_CARE_PROVIDER_SITE_OTHER): Payer: Medicaid Other | Admitting: Family Medicine

## 2019-12-27 VITALS — BP 109/70 | HR 71 | Temp 98.8°F

## 2019-12-27 DIAGNOSIS — K922 Gastrointestinal hemorrhage, unspecified: Secondary | ICD-10-CM | POA: Diagnosis not present

## 2019-12-27 DIAGNOSIS — D509 Iron deficiency anemia, unspecified: Secondary | ICD-10-CM | POA: Diagnosis not present

## 2019-12-27 DIAGNOSIS — J449 Chronic obstructive pulmonary disease, unspecified: Secondary | ICD-10-CM

## 2019-12-27 DIAGNOSIS — Z01818 Encounter for other preprocedural examination: Secondary | ICD-10-CM

## 2019-12-27 DIAGNOSIS — B182 Chronic viral hepatitis C: Secondary | ICD-10-CM

## 2019-12-27 NOTE — Progress Notes (Signed)
BP 109/70   Pulse 71   Temp 98.8 F (37.1 C) (Oral)   SpO2 99%    Subjective:    [name], female    DOB: 04-Aug-1975, 44 y.o.   MRN: [id number]  HPI: [name] is a 44 y.o. female  Chief Complaint  Patient presents with  . Surgical Clearance    right knee replacement    Here today for repeat surgical clearance for right total knee replacement. Previous procedure had to be cancelled due to admission for numerous issues, including upper GI bleed. Has been hospitalized twice for this issue since, recently discharged about 4 weeks ago and has been stable since. Last labs performed in ER 2.5 weeks ago personally reviewed with blood counts near her baseline (significant anemia at baseline), electrolytes stable overall though showed some dehydration. Has severe chronic liver disease with hepatitis C with lab changes reflective of this along with her alcoholism. EKG most recently performed 6/29 showing sinus tachycardia, which is baseline for patient. She has undergone cardiac workup for this.   She does have COPD but is stable off inhalers without recent exacerbations, wheezing, chest tightness.   Relevant past medical, surgical, family and social history reviewed and updated as indicated. Interim medical history since our last visit reviewed. Allergies and medications reviewed and updated.  Review of Systems  Per HPI unless specifically indicated above     Objective:    BP 109/70   Pulse 71   Temp 98.8 F (37.1 C) (Oral)   SpO2 99%   Wt Readings from Last 3 Encounters:  12/10/19 122 lb 5.7 oz (55.5 kg)  11/25/19 122 lb 5.7 oz (55.5 kg)  11/10/19 123 lb (55.8 kg)    Physical Exam Vitals and nursing note reviewed.  Constitutional:      Comments: Appears chronically ill  HENT:     Head: Atraumatic.     Nose: Nose normal.     Mouth/Throat:     Mouth: Mucous membranes are moist.     Pharynx: Oropharynx is clear.  Eyes:     Extraocular Movements:  Extraocular movements intact.     Conjunctiva/sclera: Conjunctivae normal.  Cardiovascular:     Rate and Rhythm: Tachycardia present.     Heart sounds: Normal heart sounds.  Pulmonary:     Effort: Pulmonary effort is normal. No respiratory distress.     Breath sounds: No wheezing or rales.  Abdominal:     General: Bowel sounds are normal.     Palpations: Abdomen is soft.  Musculoskeletal:     Cervical back: Normal range of motion and neck supple.     Comments: ROM at baseline, antalgic  Skin:    General: Skin is warm and dry.  Neurological:     Mental Status: She is alert and oriented to person, place, and time. Mental status is at baseline.  Psychiatric:        Mood and Affect: Mood normal.        Thought Content: Thought content normal.        Judgment: Judgment normal.    Results for orders placed or performed during the hospital encounter of 12/10/19  CBC with Differential  Result Value Ref Range   WBC 5.9 4.0 - 10.5 K/uL   RBC 3.31 (L) 3.87 - 5.11 MIL/uL   Hemoglobin 8.9 (L) 12.0 - 15.0 g/dL   HCT 27.1 (L) 36 - 46 %   MCV 81.9 80.0 - 100.0 fL  MCH 26.9 26.0 - 34.0 pg   MCHC 32.8 30.0 - 36.0 g/dL   RDW 21.1 (H) 11.5 - 15.5 %   Platelets 162 150 - 400 K/uL   nRBC 0.0 0.0 - 0.2 %   Neutrophils Relative % 60 %   Neutro Abs 3.5 1.7 - 7.7 K/uL   Lymphocytes Relative 22 %   Lymphs Abs 1.3 0.7 - 4.0 K/uL   Monocytes Relative 13 %   Monocytes Absolute 0.8 0 - 1 K/uL   Eosinophils Relative 4 %   Eosinophils Absolute 0.3 0 - 0 K/uL   Basophils Relative 1 %   Basophils Absolute 0.1 0 - 0 K/uL   WBC Morphology MORPHOLOGY UNREMARKABLE    RBC Morphology See Note    Smear Review Normal platelet morphology    Immature Granulocytes 0 %   Abs Immature Granulocytes 0.02 0.00 - 0.07 K/uL  Comprehensive metabolic panel  Result Value Ref Range   Sodium 133 (L) 135 - 145 mmol/L   Potassium 4.4 3.5 - 5.1 mmol/L   Chloride 99 98 - 111 mmol/L   CO2 27 22 - 32 mmol/L   Glucose,  Bld 117 (H) 70 - 99 mg/dL   BUN 10 6 - 20 mg/dL   Creatinine, Ser 0.49 0.44 - 1.00 mg/dL   Calcium 8.7 (L) 8.9 - 10.3 mg/dL   Total Protein 7.2 6.5 - 8.1 g/dL   Albumin 3.0 (L) 3.5 - 5.0 g/dL   AST 91 (H) 15 - 41 U/L   ALT 35 0 - 44 U/L   Alkaline Phosphatase 77 38 - 126 U/L   Total Bilirubin 1.3 (H) 0.3 - 1.2 mg/dL   GFR calc non Af Amer >60 >60 mL/min   GFR calc Af Amer >60 >60 mL/min   Anion gap 7 5 - 15  Brain natriuretic peptide  Result Value Ref Range   B Natriuretic Peptide 52.5 0.0 - 100.0 pg/mL      Assessment & Plan:   Problem List Items Addressed This Visit      Respiratory   COPD (chronic obstructive pulmonary disease) (HCC)    Currently stable off inhalers, no recent exacerbations        Digestive   Chronic hepatitis C without hepatic coma (HCC)    Managed by GI, unable to undergo treatment due to concurrent alcohol abuse. Continue per their recommendations, no bleeding episodes in the last few weeks      GIB (gastrointestinal bleeding)    No events since d/c from hospital per patient, blood counts improved since this admission back to near baseline        Other   IDA (iron deficiency anemia)    Blood counts near baseline when last checked about 2 weeks ago. Continue iron and folic acid supplements per Hematology       Other Visit Diagnoses    Pre-op exam    -  Primary   Per recent labs and EKG done less than 1 month ago, patient is at/near her baseline in all. No recent bleeding or breathing events.       **Will clear patient from our standpoint for her procedure as she is near baseline given all of her chronic conditions, but she does remain moderate risk for any procedure based on the complexity of her co-morbodities**  25 minutes spent today in direct patient care and counseling/coordination  Follow up plan: Return for as scheduled.

## 2019-12-28 ENCOUNTER — Telehealth: Payer: Self-pay | Admitting: Obstetrics and Gynecology

## 2019-12-28 NOTE — Telephone Encounter (Signed)
Patient is scheduled for mirena replacement on 01/31/20 with [facility]

## 2019-12-29 ENCOUNTER — Telehealth: Payer: Medicaid Other

## 2019-12-29 MED ORDER — ZOLPIDEM TARTRATE 5 MG PO TABS
5.0000 mg | ORAL_TABLET | Freq: Every evening | ORAL | 2 refills | Status: DC | PRN
Start: 2019-12-29 — End: 2020-02-03

## 2019-12-29 NOTE — Assessment & Plan Note (Addendum)
Blood counts near baseline when last checked about 2 weeks ago. Continue iron and folic acid supplements per Hematology

## 2019-12-29 NOTE — Telephone Encounter (Signed)
Surgical clearance LOV notes faxed over to Dr Rudene Christians.

## 2019-12-29 NOTE — Assessment & Plan Note (Signed)
Currently stable off inhalers, no recent exacerbations

## 2019-12-29 NOTE — Assessment & Plan Note (Signed)
Managed by GI, unable to undergo treatment due to concurrent alcohol abuse. Continue per their recommendations, no bleeding episodes in the last few weeks

## 2019-12-29 NOTE — Assessment & Plan Note (Signed)
No events since d/c from hospital per patient, blood counts improved since this admission back to near baseline

## 2019-12-30 DIAGNOSIS — Z23 Encounter for immunization: Secondary | ICD-10-CM | POA: Diagnosis not present

## 2020-01-01 ENCOUNTER — Ambulatory Visit: Payer: Self-pay | Admitting: Licensed Clinical Social Worker

## 2020-01-01 NOTE — Chronic Care Management (AMB) (Signed)
  Care Management   Follow Up Note   01/01/2020 Name: [name] MRN: [id number] DOB: 06-23-1975  Referred by: Volney American, PA-C Reason for referral : Care Coordination   Rhonda Martin is a 44 y.o. year old female who is a primary care patient of Volney American, [place]. The care management team was consulted for assistance with care management and care coordination needs.    Review of patient status, including review of consultants reports, relevant laboratory and other test results, and collaboration with appropriate care team members and the patient's provider was performed as part of comprehensive patient evaluation and provision of chronic care management services.    LCSW completed CCM outreach attempt today but was unable to reach patient successfully. A HIPPA compliant voice message was left encouraging patient to return call once available. LCSW rescheduled CCM SW appointment as well.  Eula Fried, BSW, MSW, [facility] Care Management [email or web address] Phone: [phone]

## 2020-01-02 NOTE — Telephone Encounter (Signed)
Mirena replacement apt noted. Will order to arrive by apt date/time.

## 2020-01-05 ENCOUNTER — Other Ambulatory Visit: Payer: Self-pay | Admitting: Orthopedic Surgery

## 2020-01-05 DIAGNOSIS — Z79899 Other long term (current) drug therapy: Secondary | ICD-10-CM | POA: Diagnosis not present

## 2020-01-08 ENCOUNTER — Encounter
Admission: RE | Admit: 2020-01-08 | Discharge: 2020-01-08 | Disposition: A | Discharge status: Home / Self Care | Payer: Medicaid Other | Source: Ambulatory Visit | Attending: Orthopedic Surgery | Admitting: Orthopedic Surgery

## 2020-01-08 ENCOUNTER — Other Ambulatory Visit: Payer: Self-pay

## 2020-01-08 NOTE — Patient Instructions (Addendum)
Your procedure is scheduled on: Thurs 8/5 Report to Day Surgery. To find out your arrival time please call [phone] between 1PM - 3PM on Wed. 8/4.  Remember: Instructions that are not followed completely may result in serious medical risk,  up to and including death, or upon the discretion of your surgeon and anesthesiologist your  surgery may need to be rescheduled.     _X__ 1. Do not eat food after midnight the night before your procedure.                 No gum chewing or hard candies. You may drink clear liquids up to 2 hours                 before you are scheduled to arrive for your surgery- DO not drink clear                 liquids within 2 hours of the start of your surgery.                 Clear Liquids include:  water, apple juice without pulp, clear Gatorade, G2 or                  Gatorade Zero (avoid Red/Purple/Blue), Black Coffee or Tea (Do not add                 anything to coffee or tea). ___x__2.   Complete the carbohydrate drink provided to you, 2 hours before arrival.  __X__2.  On the morning of surgery brush your teeth with toothpaste and water, you                may rinse your mouth with mouthwash if you wish.  Do not swallow any toothpaste of mouthwash.     _X__ 3.  No Alcohol for 24 hours before or after surgery.   _X__ 4.  Do Not Smoke or use e-cigarettes For 24 Hours Prior to Your Surgery.                 Do not use any chewable tobacco products for at least 6 hours prior to                 Surgery.  _X__  5.  Do not use any recreational drugs (marijuana, cocaine, heroin, ecstacy, MDMA or other)                For at least one week prior to your surgery.  Combination of these drugs with anesthesia                May have life threatening results.  ____  6.  Bring all medications with you on the day of surgery if instructed.   __x__  7.  Notify your doctor if there is any change in your medical condition      (cold, fever,  infections).     Do not wear jewelry, make-up, hairpins, clips or nail polish. Do not wear lotions, powders, or perfumes. You may wear deodorant. Do not shave 48 hours prior to surgery.  Do not bring valuables to the hospital.    [facility] is not responsible for any belongings or valuables.  Contacts, dentures or bridgework may not be worn into surgery. Leave your suitcase in the car. After surgery it may be brought to your room. For patients admitted to the hospital, discharge time is determined by your treatment team.   Patients discharged  the day of surgery will not be allowed to drive home.   Make arrangements for someone to be with you for the first 24 hours of your Same Day Discharge.    Please read over the following fact sheets that you were given:   Incentive spirometry    _x___ Take these medicines the morning of surgery with A SIP OF WATER:    1. dicyclomine (BENTYL) 20 MG tablet  2. gabapentin (NEURONTIN) 300 MG capsule  3. hydrOXYzine (ATARAX/VISTARIL) 25 MG tablet  4.metoprolol succinate (TOPROL-XL) 25 MG 24 hr tablet  5.oxyCODONE (OXY IR/ROXICODONE) 5 MG immediate release tablet  6.pantoprazole (PROTONIX) 40 MG tablet   Dose the night before and the morning of surgery             7.sucralfate (CARAFATE) 1 g tablet  ____ Fleet Enema (as directed)   __x__ Use CHG Soap (or wipes) as directed  ____ Use Benzoyl Peroxide Gel as instructed  ____ Use inhalers on the day of surgery  ____ Stop metformin 2 days prior to surgery    ____ Take 1/2 of usual insulin dose the night before surgery. No insulin the morning          of surgery.   ____ Stop Coumadin/Plavix/aspirin    ____ Stop Anti-inflammatories     _x___   May continue your vitamins and supplements   Just don't take the morning of surgery   ____ Bring C-Pap to the hospital.

## 2020-01-09 ENCOUNTER — Encounter
Admission: RE | Admit: 2020-01-09 | Discharge: 2020-01-09 | Disposition: A | Discharge status: Home / Self Care | Payer: Medicaid Other | Source: Ambulatory Visit | Attending: Orthopedic Surgery | Admitting: Orthopedic Surgery

## 2020-01-09 ENCOUNTER — Other Ambulatory Visit: Payer: Medicaid Other

## 2020-01-09 DIAGNOSIS — Z01812 Encounter for preprocedural laboratory examination: Secondary | ICD-10-CM | POA: Insufficient documentation

## 2020-01-09 DIAGNOSIS — Z20822 Contact with and (suspected) exposure to covid-19: Secondary | ICD-10-CM | POA: Insufficient documentation

## 2020-01-09 LAB — TYPE AND SCREEN
ABO/RH(D): A POS
Antibody Screen: NEGATIVE
Extend sample reason: TRANSFUSED

## 2020-01-09 LAB — URINALYSIS, ROUTINE W REFLEX MICROSCOPIC
Bacteria, UA: NONE SEEN
Bilirubin Urine: NEGATIVE
Glucose, UA: NEGATIVE mg/dL
Hgb urine dipstick: NEGATIVE
Ketones, ur: NEGATIVE mg/dL
Nitrite: NEGATIVE
Protein, ur: NEGATIVE mg/dL
Specific Gravity, Urine: 1.009 (ref 1.005–1.030)
pH: 7 (ref 5.0–8.0)

## 2020-01-09 LAB — SARS CORONAVIRUS 2 (TAT 6-24 HRS): SARS Coronavirus 2: NEGATIVE

## 2020-01-09 LAB — SURGICAL PCR SCREEN
MRSA, PCR: NEGATIVE
Staphylococcus aureus: NEGATIVE

## 2020-01-11 ENCOUNTER — Inpatient Hospital Stay: Payer: Medicaid Other | Admitting: Certified Registered"

## 2020-01-11 ENCOUNTER — Encounter: Payer: Self-pay | Admitting: Orthopedic Surgery

## 2020-01-11 ENCOUNTER — Inpatient Hospital Stay
Admission: RE | Admit: 2020-01-11 | Discharge: 2020-01-16 | DRG: 469 | Disposition: A | Discharge status: Home Health | Payer: Medicaid Other | Attending: Orthopedic Surgery | Admitting: Orthopedic Surgery

## 2020-01-11 ENCOUNTER — Encounter
Admission: RE | Disposition: A | Discharge status: Home Health | Payer: Self-pay | Source: Ambulatory Visit | Attending: Orthopedic Surgery

## 2020-01-11 ENCOUNTER — Observation Stay: Payer: Medicaid Other

## 2020-01-11 ENCOUNTER — Other Ambulatory Visit: Payer: Self-pay

## 2020-01-11 DIAGNOSIS — Z833 Family history of diabetes mellitus: Secondary | ICD-10-CM

## 2020-01-11 DIAGNOSIS — G8929 Other chronic pain: Secondary | ICD-10-CM | POA: Diagnosis present

## 2020-01-11 DIAGNOSIS — Z96641 Presence of right artificial hip joint: Secondary | ICD-10-CM | POA: Diagnosis not present

## 2020-01-11 DIAGNOSIS — Z8249 Family history of ischemic heart disease and other diseases of the circulatory system: Secondary | ICD-10-CM

## 2020-01-11 DIAGNOSIS — J189 Pneumonia, unspecified organism: Secondary | ICD-10-CM | POA: Diagnosis not present

## 2020-01-11 DIAGNOSIS — D62 Acute posthemorrhagic anemia: Secondary | ICD-10-CM | POA: Diagnosis not present

## 2020-01-11 DIAGNOSIS — K219 Gastro-esophageal reflux disease without esophagitis: Secondary | ICD-10-CM | POA: Diagnosis present

## 2020-01-11 DIAGNOSIS — G8918 Other acute postprocedural pain: Secondary | ICD-10-CM

## 2020-01-11 DIAGNOSIS — Z96651 Presence of right artificial knee joint: Secondary | ICD-10-CM | POA: Diagnosis not present

## 2020-01-11 DIAGNOSIS — Z96611 Presence of right artificial shoulder joint: Secondary | ICD-10-CM | POA: Diagnosis not present

## 2020-01-11 DIAGNOSIS — K7460 Unspecified cirrhosis of liver: Secondary | ICD-10-CM | POA: Diagnosis present

## 2020-01-11 DIAGNOSIS — D509 Iron deficiency anemia, unspecified: Secondary | ICD-10-CM | POA: Diagnosis not present

## 2020-01-11 DIAGNOSIS — G600 Hereditary motor and sensory neuropathy: Secondary | ICD-10-CM | POA: Diagnosis present

## 2020-01-11 DIAGNOSIS — F17210 Nicotine dependence, cigarettes, uncomplicated: Secondary | ICD-10-CM | POA: Diagnosis present

## 2020-01-11 DIAGNOSIS — F419 Anxiety disorder, unspecified: Secondary | ICD-10-CM | POA: Diagnosis present

## 2020-01-11 DIAGNOSIS — J219 Acute bronchiolitis, unspecified: Secondary | ICD-10-CM | POA: Diagnosis not present

## 2020-01-11 DIAGNOSIS — R509 Fever, unspecified: Secondary | ICD-10-CM

## 2020-01-11 DIAGNOSIS — J449 Chronic obstructive pulmonary disease, unspecified: Secondary | ICD-10-CM | POA: Diagnosis present

## 2020-01-11 DIAGNOSIS — I10 Essential (primary) hypertension: Secondary | ICD-10-CM | POA: Diagnosis not present

## 2020-01-11 DIAGNOSIS — F329 Major depressive disorder, single episode, unspecified: Secondary | ICD-10-CM | POA: Diagnosis present

## 2020-01-11 DIAGNOSIS — F10239 Alcohol dependence with withdrawal, unspecified: Secondary | ICD-10-CM | POA: Diagnosis not present

## 2020-01-11 DIAGNOSIS — M1711 Unilateral primary osteoarthritis, right knee: Principal | ICD-10-CM | POA: Diagnosis present

## 2020-01-11 DIAGNOSIS — G479 Sleep disorder, unspecified: Secondary | ICD-10-CM | POA: Diagnosis present

## 2020-01-11 DIAGNOSIS — F411 Generalized anxiety disorder: Secondary | ICD-10-CM | POA: Diagnosis not present

## 2020-01-11 DIAGNOSIS — N390 Urinary tract infection, site not specified: Secondary | ICD-10-CM | POA: Diagnosis not present

## 2020-01-11 DIAGNOSIS — Z471 Aftercare following joint replacement surgery: Secondary | ICD-10-CM | POA: Diagnosis not present

## 2020-01-11 DIAGNOSIS — M79661 Pain in right lower leg: Secondary | ICD-10-CM | POA: Diagnosis not present

## 2020-01-11 DIAGNOSIS — Z825 Family history of asthma and other chronic lower respiratory diseases: Secondary | ICD-10-CM

## 2020-01-11 DIAGNOSIS — M7989 Other specified soft tissue disorders: Secondary | ICD-10-CM

## 2020-01-11 HISTORY — PX: TOTAL KNEE ARTHROPLASTY: SHX125

## 2020-01-11 LAB — URINE DRUG SCREEN, QUALITATIVE (ARMC ONLY)
Amphetamines, Ur Screen: NOT DETECTED
Barbiturates, Ur Screen: NOT DETECTED
Benzodiazepine, Ur Scrn: NOT DETECTED
Cannabinoid 50 Ng, Ur ~~LOC~~: NOT DETECTED
Cocaine Metabolite,Ur ~~LOC~~: NOT DETECTED
MDMA (Ecstasy)Ur Screen: NOT DETECTED
Methadone Scn, Ur: NOT DETECTED
Opiate, Ur Screen: POSITIVE — AB
Phencyclidine (PCP) Ur S: NOT DETECTED
Tricyclic, Ur Screen: NOT DETECTED

## 2020-01-11 LAB — PROTIME-INR
INR: 1.5 — ABNORMAL HIGH (ref 0.8–1.2)
Prothrombin Time: 17.2 seconds — ABNORMAL HIGH (ref 11.4–15.2)

## 2020-01-11 LAB — APTT: aPTT: 40 seconds — ABNORMAL HIGH (ref 24–36)

## 2020-01-11 LAB — POCT PREGNANCY, URINE: Preg Test, Ur: NEGATIVE

## 2020-01-11 SURGERY — ARTHROPLASTY, KNEE, TOTAL
Anesthesia: General | Site: Knee | Laterality: Right

## 2020-01-11 MED ORDER — MAGNESIUM HYDROXIDE 400 MG/5ML PO SUSP: 30.0000 mL | Freq: Every day | ORAL | Status: DC | PRN

## 2020-01-11 MED ORDER — BISACODYL 10 MG RE SUPP: 10.0000 mg | Freq: Every day | RECTAL | Status: DC | PRN

## 2020-01-11 MED ORDER — LIDOCAINE HCL (CARDIAC) PF 100 MG/5ML IV SOSY
PREFILLED_SYRINGE | INTRAVENOUS | Status: DC | PRN
  Administered 2020-01-11: 80 mg via INTRAVENOUS

## 2020-01-11 MED ORDER — FUROSEMIDE 20 MG PO TABS
20.0000 mg | ORAL_TABLET | Freq: Every day | ORAL | Status: DC
  Administered 2020-01-12 – 2020-01-14 (×3): 20 mg via ORAL
  Filled 2020-01-11 (×3): qty 1

## 2020-01-11 MED ORDER — TRAMADOL HCL 50 MG PO TABS
50.0000 mg | ORAL_TABLET | Freq: Four times a day (QID) | ORAL | Status: DC
  Administered 2020-01-12 (×2): 50 mg via ORAL
  Filled 2020-01-11 (×4): qty 1

## 2020-01-11 MED ORDER — ORAL CARE MOUTH RINSE: 15.0000 mL | Freq: Once | OROMUCOSAL | Status: AC

## 2020-01-11 MED ORDER — PHENOL 1.4 % MT LIQD
1.0000 | OROMUCOSAL | Status: DC | PRN
  Filled 2020-01-11: qty 177

## 2020-01-11 MED ORDER — BUPIVACAINE-EPINEPHRINE (PF) 0.25% -1:200000 IJ SOLN
INTRAMUSCULAR | Status: AC
  Filled 2020-01-11: qty 30

## 2020-01-11 MED ORDER — MIDODRINE HCL 5 MG PO TABS
5.0000 mg | ORAL_TABLET | Freq: Two times a day (BID) | ORAL | Status: DC
  Administered 2020-01-12 – 2020-01-15 (×3): 5 mg via ORAL
  Filled 2020-01-11 (×6): qty 1

## 2020-01-11 MED ORDER — PROPOFOL 10 MG/ML IV BOLUS
INTRAVENOUS | Status: AC
  Filled 2020-01-11: qty 20

## 2020-01-11 MED ORDER — MIDAZOLAM HCL 2 MG/2ML IJ SOLN
INTRAMUSCULAR | Status: DC | PRN
  Administered 2020-01-11 (×2): 1 mg via INTRAVENOUS

## 2020-01-11 MED ORDER — CEFAZOLIN SODIUM-DEXTROSE 2-4 GM/100ML-% IV SOLN
INTRAVENOUS | Status: AC
  Filled 2020-01-11: qty 100

## 2020-01-11 MED ORDER — SODIUM CHLORIDE 0.9 % IV SOLN
INTRAVENOUS | Status: DC | PRN
  Administered 2020-01-11: 60 mL

## 2020-01-11 MED ORDER — CEFAZOLIN SODIUM-DEXTROSE 1-4 GM/50ML-% IV SOLN
1.0000 g | INTRAVENOUS | Status: AC
  Administered 2020-01-11: 1 g via INTRAVENOUS

## 2020-01-11 MED ORDER — ADULT MULTIVITAMIN W/MINERALS CH
1.0000 | ORAL_TABLET | Freq: Every day | ORAL | Status: DC
  Administered 2020-01-11 – 2020-01-16 (×6): 1 via ORAL
  Filled 2020-01-11 (×6): qty 1

## 2020-01-11 MED ORDER — ONDANSETRON HCL 4 MG PO TABS
4.0000 mg | ORAL_TABLET | Freq: Four times a day (QID) | ORAL | Status: DC | PRN
  Administered 2020-01-15: 4 mg via ORAL
  Filled 2020-01-11: qty 1

## 2020-01-11 MED ORDER — NEOMYCIN-POLYMYXIN B GU 40-200000 IR SOLN
Status: DC | PRN
  Administered 2020-01-11: 20 mL

## 2020-01-11 MED ORDER — HYDROMORPHONE HCL 1 MG/ML IJ SOLN
0.5000 mg | INTRAMUSCULAR | Status: DC | PRN
  Administered 2020-01-11 – 2020-01-12 (×3): 1 mg via INTRAVENOUS
  Administered 2020-01-13: 0.5 mg via INTRAVENOUS
  Filled 2020-01-11 (×5): qty 1

## 2020-01-11 MED ORDER — EPHEDRINE 5 MG/ML INJ
INTRAVENOUS | Status: AC
  Filled 2020-01-11: qty 10

## 2020-01-11 MED ORDER — LACTULOSE 10 GM/15ML PO SOLN
30.0000 g | Freq: Two times a day (BID) | ORAL | Status: DC
  Administered 2020-01-11 – 2020-01-16 (×9): 30 g via ORAL
  Filled 2020-01-11 (×10): qty 60

## 2020-01-11 MED ORDER — EPHEDRINE SULFATE 50 MG/ML IJ SOLN
INTRAMUSCULAR | Status: DC | PRN
  Administered 2020-01-11: 5 mg via INTRAVENOUS
  Administered 2020-01-11: 10 mg via INTRAVENOUS

## 2020-01-11 MED ORDER — MORPHINE SULFATE (PF) 10 MG/ML IV SOLN
INTRAVENOUS | Status: AC
  Filled 2020-01-11: qty 1

## 2020-01-11 MED ORDER — FENTANYL CITRATE (PF) 100 MCG/2ML IJ SOLN
INTRAMUSCULAR | Status: AC
  Filled 2020-01-11: qty 2

## 2020-01-11 MED ORDER — CYANOCOBALAMIN 500 MCG PO TABS
500.0000 ug | ORAL_TABLET | Freq: Every day | ORAL | Status: DC
  Administered 2020-01-11 – 2020-01-16 (×6): 500 ug via ORAL
  Filled 2020-01-11 (×4): qty 1

## 2020-01-11 MED ORDER — MORPHINE SULFATE (PF) 10 MG/ML IV SOLN
INTRAVENOUS | Status: DC | PRN
  Administered 2020-01-11: 1 mL

## 2020-01-11 MED ORDER — OXYCODONE HCL 5 MG PO TABS
10.0000 mg | ORAL_TABLET | ORAL | Status: DC | PRN
  Administered 2020-01-12: 10 mg via ORAL
  Administered 2020-01-13: 15 mg via ORAL
  Filled 2020-01-11: qty 2
  Filled 2020-01-11: qty 3

## 2020-01-11 MED ORDER — FERROUS SULFATE 325 (65 FE) MG PO TABS
325.0000 mg | ORAL_TABLET | Freq: Every day | ORAL | Status: DC
  Administered 2020-01-12 – 2020-01-16 (×5): 325 mg via ORAL
  Filled 2020-01-11 (×5): qty 1

## 2020-01-11 MED ORDER — MIDAZOLAM HCL 2 MG/2ML IJ SOLN
INTRAMUSCULAR | Status: AC
  Filled 2020-01-11: qty 2

## 2020-01-11 MED ORDER — METOCLOPRAMIDE HCL 5 MG/ML IJ SOLN: 5.0000 mg | Freq: Three times a day (TID) | INTRAMUSCULAR | Status: DC | PRN

## 2020-01-11 MED ORDER — HYDROMORPHONE HCL 1 MG/ML IJ SOLN
INTRAMUSCULAR | Status: DC | PRN
  Administered 2020-01-11: 1 mg via INTRAVENOUS

## 2020-01-11 MED ORDER — PROPOFOL 500 MG/50ML IV EMUL
INTRAVENOUS | Status: AC
  Filled 2020-01-11: qty 100

## 2020-01-11 MED ORDER — ZOLPIDEM TARTRATE 5 MG PO TABS
5.0000 mg | ORAL_TABLET | Freq: Every evening | ORAL | Status: DC | PRN
  Administered 2020-01-11: 5 mg via ORAL
  Filled 2020-01-11: qty 1

## 2020-01-11 MED ORDER — METHOCARBAMOL 500 MG PO TABS
500.0000 mg | ORAL_TABLET | Freq: Four times a day (QID) | ORAL | Status: DC | PRN
  Administered 2020-01-12 – 2020-01-14 (×4): 500 mg via ORAL
  Filled 2020-01-11 (×4): qty 1

## 2020-01-11 MED ORDER — MAGNESIUM OXIDE 400 (241.3 MG) MG PO TABS
400.0000 mg | ORAL_TABLET | Freq: Every day | ORAL | Status: DC
  Administered 2020-01-11 – 2020-01-16 (×6): 400 mg via ORAL
  Filled 2020-01-11 (×6): qty 1

## 2020-01-11 MED ORDER — SPIRONOLACTONE 25 MG PO TABS
25.0000 mg | ORAL_TABLET | Freq: Every day | ORAL | Status: DC
  Administered 2020-01-12 – 2020-01-14 (×3): 25 mg via ORAL
  Filled 2020-01-11 (×3): qty 1

## 2020-01-11 MED ORDER — MENTHOL 3 MG MT LOZG
1.0000 | LOZENGE | OROMUCOSAL | Status: DC | PRN
  Filled 2020-01-11: qty 9

## 2020-01-11 MED ORDER — OLANZAPINE 5 MG PO TABS
10.0000 mg | ORAL_TABLET | Freq: Every day | ORAL | Status: DC
  Administered 2020-01-11 – 2020-01-15 (×5): 10 mg via ORAL
  Filled 2020-01-11 (×5): qty 2

## 2020-01-11 MED ORDER — PANTOPRAZOLE SODIUM 40 MG PO TBEC
40.0000 mg | DELAYED_RELEASE_TABLET | Freq: Every day | ORAL | Status: DC
  Administered 2020-01-12 – 2020-01-16 (×5): 40 mg via ORAL
  Filled 2020-01-11 (×5): qty 1

## 2020-01-11 MED ORDER — ONDANSETRON HCL 4 MG/2ML IJ SOLN: 4.0000 mg | Freq: Once | INTRAMUSCULAR | Status: DC | PRN

## 2020-01-11 MED ORDER — DEXAMETHASONE SODIUM PHOSPHATE 10 MG/ML IJ SOLN
INTRAMUSCULAR | Status: DC | PRN
  Administered 2020-01-11: 10 mg via INTRAVENOUS

## 2020-01-11 MED ORDER — SODIUM CHLORIDE 0.9 % IV SOLN
INTRAVENOUS | Status: DC | PRN
  Administered 2020-01-11: 25 ug/min via INTRAVENOUS

## 2020-01-11 MED ORDER — BUPIVACAINE LIPOSOME 1.3 % IJ SUSP
INTRAMUSCULAR | Status: AC
  Filled 2020-01-11: qty 20

## 2020-01-11 MED ORDER — ALUM & MAG HYDROXIDE-SIMETH 200-200-20 MG/5ML PO SUSP: 30.0000 mL | ORAL | Status: DC | PRN

## 2020-01-11 MED ORDER — ONDANSETRON HCL 4 MG/2ML IJ SOLN
INTRAMUSCULAR | Status: DC | PRN
  Administered 2020-01-11: 4 mg via INTRAVENOUS

## 2020-01-11 MED ORDER — ONDANSETRON HCL 4 MG PO TABS: 4.0000 mg | ORAL_TABLET | Freq: Four times a day (QID) | ORAL | Status: DC | PRN

## 2020-01-11 MED ORDER — SUGAMMADEX SODIUM 200 MG/2ML IV SOLN
INTRAVENOUS | Status: DC | PRN
  Administered 2020-01-11: 200 mg via INTRAVENOUS

## 2020-01-11 MED ORDER — BUPIVACAINE-EPINEPHRINE (PF) 0.25% -1:200000 IJ SOLN
INTRAMUSCULAR | Status: DC | PRN
  Administered 2020-01-11: 30 mL

## 2020-01-11 MED ORDER — OXYCODONE HCL 5 MG PO TABS
5.0000 mg | ORAL_TABLET | ORAL | Status: DC | PRN
  Administered 2020-01-11 – 2020-01-12 (×2): 10 mg via ORAL
  Filled 2020-01-11 (×2): qty 2

## 2020-01-11 MED ORDER — LACTATED RINGERS IV SOLN: INTRAVENOUS | Status: DC

## 2020-01-11 MED ORDER — FOLIC ACID 1 MG PO TABS
1.0000 mg | ORAL_TABLET | Freq: Every day | ORAL | Status: DC
  Administered 2020-01-11 – 2020-01-16 (×6): 1 mg via ORAL
  Filled 2020-01-11 (×6): qty 1

## 2020-01-11 MED ORDER — PHENYLEPHRINE HCL (PRESSORS) 10 MG/ML IV SOLN
INTRAVENOUS | Status: DC | PRN
  Administered 2020-01-11: 100 ug via INTRAVENOUS

## 2020-01-11 MED ORDER — DOCUSATE SODIUM 100 MG PO CAPS
100.0000 mg | ORAL_CAPSULE | Freq: Two times a day (BID) | ORAL | Status: DC
  Administered 2020-01-11 – 2020-01-16 (×10): 100 mg via ORAL
  Filled 2020-01-11 (×10): qty 1

## 2020-01-11 MED ORDER — FENTANYL CITRATE (PF) 100 MCG/2ML IJ SOLN
INTRAMUSCULAR | Status: DC | PRN
  Administered 2020-01-11 (×2): 50 ug via INTRAVENOUS

## 2020-01-11 MED ORDER — METOCLOPRAMIDE HCL 10 MG PO TABS: 5.0000 mg | ORAL_TABLET | Freq: Three times a day (TID) | ORAL | Status: DC | PRN

## 2020-01-11 MED ORDER — ROCURONIUM BROMIDE 10 MG/ML (PF) SYRINGE
PREFILLED_SYRINGE | INTRAVENOUS | Status: AC
  Filled 2020-01-11: qty 10

## 2020-01-11 MED ORDER — HYDROMORPHONE HCL 1 MG/ML IJ SOLN
INTRAMUSCULAR | Status: AC
  Filled 2020-01-11: qty 1

## 2020-01-11 MED ORDER — FENTANYL CITRATE (PF) 100 MCG/2ML IJ SOLN
25.0000 ug | INTRAMUSCULAR | Status: DC | PRN
  Administered 2020-01-11 (×2): 25 ug via INTRAVENOUS

## 2020-01-11 MED ORDER — CHLORHEXIDINE GLUCONATE 0.12 % MT SOLN
OROMUCOSAL | Status: AC
  Administered 2020-01-11: 15 mL via OROMUCOSAL
  Filled 2020-01-11: qty 15

## 2020-01-11 MED ORDER — DULOXETINE HCL 20 MG PO CPEP
20.0000 mg | ORAL_CAPSULE | Freq: Every day | ORAL | Status: DC
  Administered 2020-01-11 – 2020-01-12 (×2): 20 mg via ORAL
  Filled 2020-01-11 (×4): qty 1

## 2020-01-11 MED ORDER — SODIUM CHLORIDE 0.9 % IV SOLN: INTRAVENOUS | Status: DC

## 2020-01-11 MED ORDER — POTASSIUM CHLORIDE CRYS ER 20 MEQ PO TBCR
20.0000 meq | EXTENDED_RELEASE_TABLET | Freq: Every day | ORAL | Status: DC
  Administered 2020-01-12 – 2020-01-16 (×5): 20 meq via ORAL
  Filled 2020-01-11 (×5): qty 1

## 2020-01-11 MED ORDER — DICYCLOMINE HCL 20 MG PO TABS
20.0000 mg | ORAL_TABLET | Freq: Three times a day (TID) | ORAL | Status: DC
  Administered 2020-01-11 – 2020-01-16 (×17): 20 mg via ORAL
  Filled 2020-01-11 (×23): qty 1

## 2020-01-11 MED ORDER — HYDROMORPHONE HCL 1 MG/ML IJ SOLN
0.2500 mg | INTRAMUSCULAR | Status: DC | PRN
  Administered 2020-01-11 (×7): 0.25 mg via INTRAVENOUS

## 2020-01-11 MED ORDER — METOPROLOL SUCCINATE ER 25 MG PO TB24
12.5000 mg | ORAL_TABLET | Freq: Every day | ORAL | Status: DC
  Administered 2020-01-12 – 2020-01-14 (×3): 12.5 mg via ORAL
  Filled 2020-01-11 (×3): qty 1

## 2020-01-11 MED ORDER — CHLORHEXIDINE GLUCONATE 0.12 % MT SOLN: 15.0000 mL | Freq: Once | OROMUCOSAL | Status: AC

## 2020-01-11 MED ORDER — DEXMEDETOMIDINE HCL IN NACL 200 MCG/50ML IV SOLN
INTRAVENOUS | Status: DC | PRN
Start: 2020-01-11 — End: 2020-01-11
  Administered 2020-01-11 (×2): 8 ug via INTRAVENOUS

## 2020-01-11 MED ORDER — CEFAZOLIN SODIUM-DEXTROSE 1-4 GM/50ML-% IV SOLN
1.0000 g | Freq: Four times a day (QID) | INTRAVENOUS | Status: AC
  Administered 2020-01-11 (×2): 1 g via INTRAVENOUS
  Filled 2020-01-11 (×2): qty 50

## 2020-01-11 MED ORDER — SUCRALFATE 1 G PO TABS
1.0000 g | ORAL_TABLET | Freq: Three times a day (TID) | ORAL | Status: DC
  Administered 2020-01-11 – 2020-01-16 (×19): 1 g via ORAL
  Filled 2020-01-11 (×19): qty 1

## 2020-01-11 MED ORDER — GABAPENTIN 300 MG PO CAPS
300.0000 mg | ORAL_CAPSULE | Freq: Three times a day (TID) | ORAL | Status: DC | PRN
  Administered 2020-01-12 – 2020-01-13 (×4): 300 mg via ORAL
  Filled 2020-01-11 (×4): qty 1

## 2020-01-11 MED ORDER — ONDANSETRON HCL 4 MG/2ML IJ SOLN: 4.0000 mg | Freq: Four times a day (QID) | INTRAMUSCULAR | Status: DC | PRN

## 2020-01-11 MED ORDER — GLYCOPYRROLATE 0.2 MG/ML IJ SOLN
INTRAMUSCULAR | Status: DC | PRN
  Administered 2020-01-11: .2 mg via INTRAVENOUS

## 2020-01-11 MED ORDER — THIAMINE HCL 100 MG PO TABS
100.0000 mg | ORAL_TABLET | Freq: Every day | ORAL | Status: DC
  Administered 2020-01-11 – 2020-01-16 (×6): 100 mg via ORAL
  Filled 2020-01-11 (×7): qty 1

## 2020-01-11 MED ORDER — NEOMYCIN-POLYMYXIN B GU 40-200000 IR SOLN
Status: AC
  Filled 2020-01-11: qty 20

## 2020-01-11 MED ORDER — HYDROXYZINE HCL 25 MG PO TABS
25.0000 mg | ORAL_TABLET | Freq: Three times a day (TID) | ORAL | Status: DC | PRN
  Filled 2020-01-11 (×2): qty 1

## 2020-01-11 MED ORDER — KETAMINE HCL 50 MG/ML IJ SOLN
INTRAMUSCULAR | Status: DC | PRN
  Administered 2020-01-11: 10 mg via INTRAMUSCULAR
  Administered 2020-01-11: 15 mg via INTRAMUSCULAR

## 2020-01-11 MED ORDER — ROCURONIUM BROMIDE 100 MG/10ML IV SOLN
INTRAVENOUS | Status: DC | PRN
  Administered 2020-01-11: 10 mg via INTRAVENOUS
  Administered 2020-01-11: 40 mg via INTRAVENOUS
  Administered 2020-01-11: 10 mg via INTRAVENOUS

## 2020-01-11 MED ORDER — MAGNESIUM CITRATE PO SOLN
1.0000 | Freq: Once | ORAL | Status: DC | PRN
  Filled 2020-01-11: qty 296

## 2020-01-11 MED ORDER — METHOCARBAMOL 1000 MG/10ML IJ SOLN
500.0000 mg | Freq: Four times a day (QID) | INTRAVENOUS | Status: DC | PRN
  Filled 2020-01-11: qty 5

## 2020-01-11 MED ORDER — ASPIRIN 81 MG PO CHEW
81.0000 mg | CHEWABLE_TABLET | Freq: Two times a day (BID) | ORAL | Status: DC
  Administered 2020-01-11 – 2020-01-16 (×10): 81 mg via ORAL
  Filled 2020-01-11 (×10): qty 1

## 2020-01-11 MED ORDER — PROPOFOL 10 MG/ML IV BOLUS
INTRAVENOUS | Status: DC | PRN
  Administered 2020-01-11: 100 mg via INTRAVENOUS

## 2020-01-11 MED ORDER — SODIUM CHLORIDE FLUSH 0.9 % IV SOLN
INTRAVENOUS | Status: AC
  Filled 2020-01-11: qty 40

## 2020-01-11 SURGICAL SUPPLY — 73 items
BLADE SAGITTAL 25.0X1.19X90 (BLADE) ×4 IMPLANT
BLADE SAGITTAL 25.0X1.19X90MM (BLADE) ×2
BLADE SAGITTAL AGGR TOOTH XLG (BLADE) ×3 IMPLANT
BLADE SAW 90X13X1.19 OSCILLAT (BLADE) ×3 IMPLANT
BNDG ELASTIC 6X5.8 VLCR STR LF (GAUZE/BANDAGES/DRESSINGS) ×3 IMPLANT
CANISTER SUCT 1200ML W/VALVE (MISCELLANEOUS) ×3 IMPLANT
CANISTER SUCT 3000ML PPV (MISCELLANEOUS) ×6 IMPLANT
CANISTER WOUND CARE 500ML ATS (WOUND CARE) ×3 IMPLANT
CEMENT HV SMART SET (Cement) ×6 IMPLANT
CHLORAPREP W/TINT 26 (MISCELLANEOUS) ×6 IMPLANT
COOLER POLAR GLACIER W/PUMP (MISCELLANEOUS) ×3 IMPLANT
COVER WAND RF STERILE (DRAPES) ×3 IMPLANT
CUFF TOURN SGL QUICK 24 (TOURNIQUET CUFF) ×2
CUFF TOURN SGL QUICK 30 (TOURNIQUET CUFF)
CUFF TRNQT CYL 24X4X16.5-23 (TOURNIQUET CUFF) ×1 IMPLANT
CUFF TRNQT CYL 30X4X21-28X (TOURNIQUET CUFF) IMPLANT
DRAPE 3/4 80X56 (DRAPES) ×6 IMPLANT
ELECT CAUTERY BLADE 6.4 (BLADE) ×3 IMPLANT
ELECT REM PT RETURN 9FT ADLT (ELECTROSURGICAL) ×3
ELECTRODE REM PT RTRN 9FT ADLT (ELECTROSURGICAL) ×1 IMPLANT
FEM COMP SZ3 RIGHT (Joint) ×3 IMPLANT
FIBER TAPE 2MM (SUTURE) ×3 IMPLANT
GAUZE SPONGE 4X4 12PLY STRL (GAUZE/BANDAGES/DRESSINGS) ×3 IMPLANT
GAUZE XEROFORM 1X8 LF (GAUZE/BANDAGES/DRESSINGS) ×3 IMPLANT
GLOVE BIOGEL PI IND STRL 9 (GLOVE) ×1 IMPLANT
GLOVE BIOGEL PI INDICATOR 9 (GLOVE) ×2
GLOVE INDICATOR 8.0 STRL GRN (GLOVE) ×3 IMPLANT
GLOVE SURG ORTHO 8.0 STRL STRW (GLOVE) ×3 IMPLANT
GLOVE SURG SYN 9.0  PF PI (GLOVE) ×2
GLOVE SURG SYN 9.0 PF PI (GLOVE) ×1 IMPLANT
GOWN SRG 2XL LVL 4 RGLN SLV (GOWNS) ×1 IMPLANT
GOWN STRL NON-REIN 2XL LVL4 (GOWNS) ×2
GOWN STRL REUS W/ TWL LRG LVL3 (GOWN DISPOSABLE) ×1 IMPLANT
GOWN STRL REUS W/ TWL XL LVL3 (GOWN DISPOSABLE) ×1 IMPLANT
GOWN STRL REUS W/TWL LRG LVL3 (GOWN DISPOSABLE) ×2
GOWN STRL REUS W/TWL XL LVL3 (GOWN DISPOSABLE) ×2
HOLDER FOLEY CATH W/STRAP (MISCELLANEOUS) IMPLANT
HOOD PEEL AWAY FLYTE STAYCOOL (MISCELLANEOUS) ×6 IMPLANT
KIT PREVENA INCISION MGT20CM45 (CANNISTER) ×3 IMPLANT
KIT TURNOVER KIT A (KITS) ×3 IMPLANT
KNEE TIBIAL INSERT 2 02070217P (Insert) ×3 IMPLANT
NDL SAFETY ECLIPSE 18X1.5 (NEEDLE) ×1 IMPLANT
NEEDLE HYPO 18GX1.5 SHARP (NEEDLE) ×2
NEEDLE SPNL 18GX3.5 QUINCKE PK (NEEDLE) ×3 IMPLANT
NEEDLE SPNL 20GX3.5 QUINCKE YW (NEEDLE) ×3 IMPLANT
NS IRRIG 1000ML POUR BTL (IV SOLUTION) ×3 IMPLANT
PACK TOTAL KNEE (MISCELLANEOUS) ×3 IMPLANT
PAD WRAPON POLAR KNEE (MISCELLANEOUS) ×1 IMPLANT
PATELLA RESURFACING MEDACTA 02 (Bone Implant) ×3 IMPLANT
PENCIL SMOKE EVACUATOR COATED (MISCELLANEOUS) ×3 IMPLANT
PULSAVAC PLUS IRRIG FAN TIP (DISPOSABLE) ×3
SCALPEL PROTECTED #10 DISP (BLADE) ×6 IMPLANT
SOL .9 NS 3000ML IRR  AL (IV SOLUTION) ×2
SOL .9 NS 3000ML IRR UROMATIC (IV SOLUTION) ×1 IMPLANT
STAPLER SKIN PROX 35W (STAPLE) ×3 IMPLANT
STEM EXTENSION 11MMX30MM (Stem) ×3 IMPLANT
SUCTION FRAZIER HANDLE 10FR (MISCELLANEOUS) ×2
SUCTION TUBE FRAZIER 10FR DISP (MISCELLANEOUS) ×1 IMPLANT
SUT DVC 2 QUILL PDO  T11 36X36 (SUTURE) ×2
SUT DVC 2 QUILL PDO T11 36X36 (SUTURE) ×1 IMPLANT
SUT ETHIBOND 2 V 37 (SUTURE) IMPLANT
SUT V-LOC 90 ABS DVC 3-0 CL (SUTURE) ×3 IMPLANT
SUT VIC AB 1 CT1 36 (SUTURE) ×3 IMPLANT
SYR 20ML LL LF (SYRINGE) ×3 IMPLANT
SYR 50ML LL SCALE MARK (SYRINGE) ×6 IMPLANT
SYS INTERNAL BRACE KNEE (Miscellaneous) ×3 IMPLANT
SYSTEM INTERNAL BRACE KNEE (Miscellaneous) ×1 IMPLANT
TIB TRAY SZ 2 R FIXED (Joint) ×3 IMPLANT
TIP FAN IRRIG PULSAVAC PLUS (DISPOSABLE) ×1 IMPLANT
TOWEL OR 17X26 4PK STRL BLUE (TOWEL DISPOSABLE) ×3 IMPLANT
TOWER CARTRIDGE SMART MIX (DISPOSABLE) ×3 IMPLANT
TRAY FOLEY MTR SLVR 16FR STAT (SET/KITS/TRAYS/PACK) IMPLANT
WRAPON POLAR PAD KNEE (MISCELLANEOUS) ×3

## 2020-01-11 NOTE — Op Note (Signed)
Date 01/11/20  Time 12:40   PATIENT:   Rhonda Martin   PRE-OPERATIVE DIAGNOSIS:  Primary localized osteoarthritis of right knee   POST-OPERATIVE DIAGNOSIS:  Same   PROCEDURE:  Procedure(s): Right TOTAL KNEE ARTHROPLASTY   SURGEON: Laurene Footman, MD   ASSISTANTS: None   ANESTHESIA:   general   EBL:     BLOOD ADMINISTERED:none   DRAINS: Incisional wound VAC    LOCAL MEDICATIONS USED:  MARCAINE    and OTHER morphine and Exparel   SPECIMEN:  No Specimen   DISPOSITION OF SPECIMEN:  N/A   COUNTS:  YES   TOURNIQUET:   90 minutes at 300 mm Hg   IMPLANTS: Medacta  GMK PS system with  three femur, size 2 right tibia with short stem and  17 PS mm insert.  Size 2 patella, all components cemented.   DICTATION: Viviann Spare Dictation   patient was brought to the operating room and general anesthesia was obtained.  After prepping and draping the  right leg in sterile fashion, and after patient identification and timeout procedures were completed, tourniquet was raised  and midline skin incision was made followed by medial parapatellar arthrotomy with  mild medial compartment osteoarthritis, severe patellofemoral arthritis and  severe with significant bone loss lateral compartment arthritis, partial synovectomy was also carried out, there is extensive synovitis present.   The PCL and fat pad were excised along with anterior horns of the meniscus with the ACL having been deficient. The proximal tibia cutting guide from  the [facility] system was applied and the proximal tibia cut carried out.  The distal femoral cut was carried out in a similar fashion     The  three femoral cutting guide applied with anterior posterior and chamfer cuts made.  The posterior horns of the menisci were removed at this point.   Injection of the above medication was carried out after the femoral and tibial cuts were carried out.  The  two baseplate trial was placed pinned into position and proximal tibial preparation carried out  with drilling hand reaming and the keel punch followed by placement of the  three femur and sizing the tibial insert size   17 millimeter gave the best fit with stability and full extension after having done lateral releases to allow for the knee to be straight. There was significant fixed valgus contracture present The distal femoral drill holes were made in the notch cut for the trochlear groove was then carried out with trials were then removed the patella was cut using the patellar cutting guide and it sized to a size 2 after drill holes have been made  The knee was irrigated with pulsatile lavage and the bony surfaces dried the tibial component was cemented into place first.  Excess cement was removed and the polyethylene insert placed with a torque screw placed with a torque screwdriver tightened.  The distal femoral component was placed and the knee was held in extension as the patellar button was clamped into place. The MCL was quite attenuated with her valgus deformity and a suture anchor was placed in the proximal medial tibia close to where she had had a prior ACL tunnel and fiber tape woven through the MCL to try to tighten up his medial side which was successful. Additionally the patellar tendon was quite thin and partially attenuated and so a additional fiber tape suture was placed around the patella on both sides of the capsule after wound closure and a suture anchor placed  to reinforce the patellar tendon and allow for early mobilization. After the cement was set, excess cement was removed and the knee was again irrigated thoroughly thoroughly irrigated.  The tourniquet was let down and hemostasis checked with electrocautery. The arthrotomy was repaired with a heavy Quill suture,  followed by 3-0 V lock subcuticular closure, skin staples followed by incisional wound VAC and Polar Care.Marland Kitchen   PLAN OF CARE: Admit for overnight observation   PATIENT DISPOSITION:  PACU - hemodynamically stable.

## 2020-01-11 NOTE — Progress Notes (Signed)
Pt refused IS education, pt stated she knows how to use. Pt stated she was in too much pain to do it right now. RN notified

## 2020-01-11 NOTE — Anesthesia Preprocedure Evaluation (Signed)
Anesthesia Evaluation  Patient identified by MRN, date of birth, ID band Patient awake    Reviewed: Allergy & Precautions, NPO status , Patient's Chart, lab work & pertinent test results  History of Anesthesia Complications (+) PONV and Family history of anesthesia reactionNegative for: history of anesthetic complications  Airway Mallampati: II  TM Distance: >3 FB Neck ROM: Full    Dental  (+) Poor Dentition   Pulmonary pneumonia, resolved, COPD, Current Smoker and Patient abstained from smoking.,    breath sounds clear to auscultation- rhonchi (-) wheezing      Cardiovascular hypertension, Pt. on medications (-) CAD, (-) Past MI, (-) Cardiac Stents and (-) CABG  Rhythm:Regular Rate:Normal - Systolic murmurs and - Diastolic murmurs    Neuro/Psych neg Seizures PSYCHIATRIC DISORDERS Anxiety Depression negative neurological ROS     GI/Hepatic GERD  ,(+) Cirrhosis       , Hepatitis -, CHx of GI bleeding   Endo/Other  negative endocrine ROSneg diabetes  Renal/GU Renal disease     Musculoskeletal negative musculoskeletal ROS (+)   Abdominal (+) - obese,   Peds  Hematology  (+) anemia ,   Anesthesia Other Findings Past Medical History: No date: Alcohol abuse No date: Anxiety No date: Back injury No date: Cervical cancer (HCC) 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 No date: COPD (chronic obstructive pulmonary disease) (HCC) No date: Family history of adverse reaction to anesthesia     Comment:  PONV No date: GERD (gastroesophageal reflux disease) No date: Hepatitis No date: Hypertension No date: Hypokalemia 06/26/2019: IDA (iron deficiency anemia) No date: Iron deficiency anemia No date: Leg injury No date: Liver cirrhosis ([place]) No date: Pneumonia 07/10/2019: Sepsis ([place]) 06/26/2019: Symptomatic anemia No date: Thrombocytopenia ([place])   Reproductive/Obstetrics                              Anesthesia Physical  Anesthesia Plan  ASA: III  Anesthesia Plan: General   Post-op Pain Management:    Induction: Intravenous  PONV Risk Score and Plan: 1 and Ondansetron and Dexamethasone  Airway Management Planned: Oral ETT  Additional Equipment:   Intra-op Plan:   Post-operative Plan: Extubation in OR  Informed Consent: I have reviewed the patients History and Physical, chart, labs and discussed the procedure including the risks, benefits and alternatives for the proposed anesthesia with the patient or authorized representative who has indicated his/her understanding and acceptance.     Dental advisory given  Plan Discussed with: CRNA and Anesthesiologist  Anesthesia Plan Comments:         Anesthesia Quick Evaluation

## 2020-01-11 NOTE — H&P (Signed)
Chief Complaint  Patient presents with  . Right Knee - Pain  . Knee Pain  H&P- Rt TKA-01/11/20-[place]   Reason for Visit Rhonda Martin is a 44 y.o. who present today for history and physical. She is to undergo a right total knee arthroplasty on 01/11/2020. Was last seen in our clinic on 08/21/2019. Patient was scheduled for right total knee arthroplasty but ended up having to be admitted to the hospital ICU secondary to GI bleed along with sepsis right lower extremity. She since has obtained medical clearance for surgery.  She is had several traumas to her right knee, 2015 with her last surgery where she had intramedullary rod removed from her tibia. Patient also describes history of ACL reconstruction many years ago. She is requesting total knee arthroplasty, has had severe chronic right knee pain that is limiting her ability to walk. Her knee is constantly giving way and buckling. She continues to have pain and limited mobility despite walking with a cane and using a knee hinged brace. Her pain is along the lateral aspect of the knee. She denies any groin pain. No back pain. She takes Norco for her chronic knee pain. No history of blood clots. Has a history of alcoholism, states she takes 1-2 shots of alcohol daily only for severe pain.  The patient has Charcot-Marie-Tooth disease as well. The patient states she had a fall recently and has foot pain and right wrist.  Past Medical History Past Medical History:  Diagnosis Date  . GERD (gastroesophageal reflux disease)  . Migraine  . Sleep disorder  . Ulcer   Past Surgical History Past Surgical History:  Procedure Laterality Date  . foot and ankle surgery 12/08/1989-07/23/2010  fusion  . Knee Surgery Right 02/19/12  replaced ligaments   Past Family History Family History  Problem Relation Age of Onset  . Osteoarthritis Father  . Cancer Father  unsure of what kind  . Diabetes type II Mother  . High blood pressure (Hypertension) Mother  .  High blood pressure (Hypertension) Sister  . Breast cancer Maternal Aunt  . Heart disease Maternal Aunt  . COPD Maternal Uncle  . COPD Maternal Uncle   Medications Current Outpatient Medications Ordered in Epic  Medication Sig Dispense Refill  . cyanocobalamin (VITAMIN B12) 500 MCG tablet Take by mouth  . dicyclomine (BENTYL) 20 mg tablet Take by mouth  . DULoxetine (CYMBALTA) 20 MG DR capsule Take 20 mg by mouth once daily  . ferrous sulfate 325 (65 FE) MG EC tablet Take 325 mg by mouth 2 (two) times daily  . folic acid (FOLVITE) 1 MG tablet Take 1,000 mcg by mouth once daily  . FUROsemide (LASIX) 20 MG tablet Take by mouth  . gabapentin (NEURONTIN) 300 MG capsule Take 1 capsule by mouth 3 (three) times daily as needed  . hydrOXYzine (ATARAX) 25 MG tablet Take 1 tablet by mouth 3 (three) times daily as needed  . lactulose (ENULOSE) 10 gram/15 mL oral solution Take by mouth  . magnesium oxide (MAG-OX) 400 mg (241.3 mg magnesium) tablet Take by mouth  . metoprolol succinate (TOPROL-XL) 25 MG XL tablet Take 1 tablet by mouth once daily  . midodrine (PROAMATINE) 5 MG tablet Take by mouth  . OLANZapine (ZYPREXA) 10 MG tablet Take 10 mg by mouth nightly  . ondansetron (ZOFRAN) 4 MG tablet Take by mouth  . oxyCODONE (ROXICODONE) 5 MG immediate release tablet Take 1 tablet by mouth every 6 (six) hours as needed  . pantoprazole (PROTONIX)  40 MG DR tablet Take 40 mg by mouth once daily  . potassium chloride (KLOR-CON) 10 MEQ ER tablet Take 1 tablet (10 mEq total) by mouth 3 (three) times daily 45 tablet 0  . potassium chloride (KLOR-CON) 20 MEQ ER tablet Take by mouth  . spironolactone (ALDACTONE) 25 MG tablet Take 25 mg by mouth once daily  . sucralfate (CARAFATE) 1 gram tablet Take 1 g by mouth 4 (four) times daily as needed  . thiamine (VITAMIN B-1) 100 MG tablet Take by mouth  . traMADoL (ULTRAM) 50 mg tablet TAKE 1 TABLET (50 MG TOTAL) BY MOUTH EVERY 12 (TWELVE) HOURS AS NEEDED FOR SEVERE  PAIN.  Marland Kitchen triamcinolone 0.1 % cream Apply topically 2 (two) times daily as needed  . zolpidem (AMBIEN) 5 MG tablet Take 1 tablet by mouth nightly as needed   No current Epic-ordered facility-administered medications on file.   Allergies No Known Allergies   Review of Systems A comprehensive 14 point ROS was performed, reviewed, and the pertinent orthopaedic findings are documented in the HPI.  Exam BP 110/80  Ht 157.5 cm (5\' 2" )  Wt 61.7 kg (136 lb)  LMP (LMP Unknown)  BMI 24.87 kg/m   General: Well-developed well-nourished female seen in no acute distress.   HEENT: Atraumatic,normocephalic. Pupils are equal and reactive to light. Oropharynx is clear with moist mucosa  Lungs: Clear to auscultation bilaterally   Cardiovascular: Regular rate and rhythm. Normal S1, S2. No murmurs. No appreciable gallops or rubs. Peripheral pulses are palpable.  Abdomen: Soft, non-tender, nondistended. Bowel sounds present  Extremity: Examination of the right lower extremity shows patient has valgus deformity of the right knee. She ambulates with a cane, knee hinged brace. She has valgus thrust the right knee. Right knee extension to 20 degrees. Right medial and anteromedial scars from remote ACL surgery. Right leg has a 25 degree valgus deformity, mostly passively correctable Valgus deformity is partially passively correctable. She is tender along the lateral joint line nontender along the medial joint line. There is moderate swelling noted along the medial aspect of the knee. Large medial incision present, healed with no signs of any infection.   Neurological:  The patient is alert and oriented Gross motor strength appeared to be equal to 5/5  Vascular :  Peripheral pulses felt to be palpable. Capillary refill appears to be intact and within normal limits  X-ray  1. AP lateral sunrise views of the right knee are ordered on 03/13/2019 showed severe valgus deformity of the right knee with  complete loss of joint space in the lateral compartment with subchondral sclerosing and subchondral cyst formation. There is mild medial compartment sclerotic changes. Mild patellofemoral osteoarthritis. No evidence of acute bony abnormality.  Impression  1. Degenerative arthrosis right knee  Plan   1. Questions were answered to patient satisfaction as well as her husband. 2. Postop course was discussed. 3. Return to clinic 2 weeks postop   This note was generated in part with voice recognition software and I apologize for any typographical errors that were not detected and corrected   Watt Climes PA  Electronically signed by Regino Bellow, PA at 01/08/2020 3:32 PM EDT  Reviewed paper H+P, will be scanned into chart. No changes noted.

## 2020-01-11 NOTE — Anesthesia Procedure Notes (Signed)
Procedure Name: Intubation Performed by: Kelton Pillar, CRNA Pre-anesthesia Checklist: Patient identified, Emergency Drugs available, Suction available and Patient being monitored Patient Re-evaluated:Patient Re-evaluated prior to induction Oxygen Delivery Method: Circle system utilized Preoxygenation: Pre-oxygenation with 100% oxygen Induction Type: IV induction Ventilation: Mask ventilation without difficulty Laryngoscope Size: McGraph and 3 Grade View: Grade I Tube type: Oral Tube size: 6.5 mm Number of attempts: 1 Airway Equipment and Method: Stylet and Oral airway Placement Confirmation: ETT inserted through vocal cords under direct vision,  positive ETCO2,  breath sounds checked- equal and bilateral and CO2 detector Secured at: 20.5 cm Tube secured with: Tape Dental Injury: Teeth and Oropharynx as per pre-operative assessment

## 2020-01-11 NOTE — Transfer of Care (Signed)
Immediate Anesthesia Transfer of Care Note  Patient: [name]  Procedure(s) Performed: Right Total Knee Arthroplasty (Right Knee)  Patient Location: PACU  Anesthesia Type:General  Level of Consciousness: awake, drowsy and patient cooperative  Airway & Oxygen Therapy: Patient Spontanous Breathing and Patient connected to face mask oxygen  Post-op Assessment: Report given to RN and Post -op Vital signs reviewed and stable  Post vital signs: Reviewed and stable  Last Vitals:  Vitals Value Taken Time  BP 137/81 01/11/20 1248  Temp    Pulse 101 01/11/20 1255  Resp 19 01/11/20 1255  SpO2 97 % 01/11/20 1255  Vitals shown include unvalidated device data.  Last Pain:  Vitals:   01/11/20 1251  TempSrc:   PainSc: (P) Asleep         Complications: No complications documented.

## 2020-01-11 NOTE — Progress Notes (Signed)
PT Cancellation Note  Patient Details Name: Rhonda Martin MRN: [id number] DOB: Mar 23, 1976   Cancelled Treatment:    Reason Eval/Treat Not Completed: Other (comment) Spoke with surgeon who requests for PT to hold this evening and initiate rehab tomorrow.    Kreg Shropshire, DPT 01/11/2020, 5:34 PM

## 2020-01-11 NOTE — Progress Notes (Addendum)
Pt arrived to room 146 from PACU, very sleepy but able to arouse. Pt has wound vac to right knee and wakes up to use the bedpan. Pt also has a pillow under her right knee which is to remain in place per Dr. Rudene Christians. Pt refused skin assessment due to pain. Pt complaining of pain and requesting medication but when RN returned to room a few minutes later pt was asleep and did not arouse when spoken to. VSS

## 2020-01-12 ENCOUNTER — Encounter: Payer: Self-pay | Admitting: Orthopedic Surgery

## 2020-01-12 DIAGNOSIS — J449 Chronic obstructive pulmonary disease, unspecified: Secondary | ICD-10-CM | POA: Diagnosis present

## 2020-01-12 DIAGNOSIS — Z8249 Family history of ischemic heart disease and other diseases of the circulatory system: Secondary | ICD-10-CM | POA: Diagnosis not present

## 2020-01-12 DIAGNOSIS — G8929 Other chronic pain: Secondary | ICD-10-CM | POA: Diagnosis present

## 2020-01-12 DIAGNOSIS — D62 Acute posthemorrhagic anemia: Secondary | ICD-10-CM | POA: Diagnosis not present

## 2020-01-12 DIAGNOSIS — I10 Essential (primary) hypertension: Secondary | ICD-10-CM | POA: Diagnosis present

## 2020-01-12 DIAGNOSIS — F17210 Nicotine dependence, cigarettes, uncomplicated: Secondary | ICD-10-CM | POA: Diagnosis present

## 2020-01-12 DIAGNOSIS — K219 Gastro-esophageal reflux disease without esophagitis: Secondary | ICD-10-CM | POA: Diagnosis present

## 2020-01-12 DIAGNOSIS — M7989 Other specified soft tissue disorders: Secondary | ICD-10-CM | POA: Diagnosis not present

## 2020-01-12 DIAGNOSIS — K7460 Unspecified cirrhosis of liver: Secondary | ICD-10-CM | POA: Diagnosis present

## 2020-01-12 DIAGNOSIS — F10239 Alcohol dependence with withdrawal, unspecified: Secondary | ICD-10-CM | POA: Diagnosis not present

## 2020-01-12 DIAGNOSIS — M1711 Unilateral primary osteoarthritis, right knee: Secondary | ICD-10-CM | POA: Diagnosis present

## 2020-01-12 DIAGNOSIS — G479 Sleep disorder, unspecified: Secondary | ICD-10-CM | POA: Diagnosis present

## 2020-01-12 DIAGNOSIS — Z833 Family history of diabetes mellitus: Secondary | ICD-10-CM | POA: Diagnosis not present

## 2020-01-12 DIAGNOSIS — R000 Tachycardia, unspecified: Secondary | ICD-10-CM | POA: Diagnosis not present

## 2020-01-12 DIAGNOSIS — F419 Anxiety disorder, unspecified: Secondary | ICD-10-CM | POA: Diagnosis present

## 2020-01-12 DIAGNOSIS — R509 Fever, unspecified: Secondary | ICD-10-CM | POA: Diagnosis not present

## 2020-01-12 DIAGNOSIS — R600 Localized edema: Secondary | ICD-10-CM | POA: Diagnosis not present

## 2020-01-12 DIAGNOSIS — N390 Urinary tract infection, site not specified: Secondary | ICD-10-CM | POA: Diagnosis not present

## 2020-01-12 DIAGNOSIS — Z825 Family history of asthma and other chronic lower respiratory diseases: Secondary | ICD-10-CM | POA: Diagnosis not present

## 2020-01-12 DIAGNOSIS — M79661 Pain in right lower leg: Secondary | ICD-10-CM | POA: Diagnosis not present

## 2020-01-12 DIAGNOSIS — J219 Acute bronchiolitis, unspecified: Secondary | ICD-10-CM | POA: Diagnosis not present

## 2020-01-12 DIAGNOSIS — G600 Hereditary motor and sensory neuropathy: Secondary | ICD-10-CM | POA: Diagnosis present

## 2020-01-12 DIAGNOSIS — F329 Major depressive disorder, single episode, unspecified: Secondary | ICD-10-CM | POA: Diagnosis present

## 2020-01-12 DIAGNOSIS — J189 Pneumonia, unspecified organism: Secondary | ICD-10-CM | POA: Diagnosis not present

## 2020-01-12 DIAGNOSIS — J180 Bronchopneumonia, unspecified organism: Secondary | ICD-10-CM | POA: Diagnosis not present

## 2020-01-12 LAB — BASIC METABOLIC PANEL
Anion gap: 8 (ref 5–15)
BUN: 10 mg/dL (ref 6–20)
CO2: 25 mmol/L (ref 22–32)
Calcium: 8 mg/dL — ABNORMAL LOW (ref 8.9–10.3)
Chloride: 102 mmol/L (ref 98–111)
Creatinine, Ser: 0.45 mg/dL (ref 0.44–1.00)
GFR calc Af Amer: >60 mL/min (ref >60)
GFR calc non Af Amer: >60 mL/min (ref >60)
Glucose, Bld: 192 mg/dL — ABNORMAL HIGH (ref 70–99)
Potassium: 4 mmol/L (ref 3.5–5.1)
Sodium: 135 mmol/L (ref 135–145)

## 2020-01-12 LAB — CBC
HCT: 29 % — ABNORMAL LOW (ref 36.0–46.0)
Hemoglobin: 9.9 g/dL — ABNORMAL LOW (ref 12.0–15.0)
MCH: 28 pg (ref 26.0–34.0)
MCHC: 34.1 g/dL (ref 30.0–36.0)
MCV: 82.2 fL (ref 80.0–100.0)
Platelets: 185 10*3/uL (ref 150–400)
RBC: 3.53 MIL/uL — ABNORMAL LOW (ref 3.87–5.11)
RDW: 22.4 % — ABNORMAL HIGH (ref 11.5–15.5)
WBC: 12.2 10*3/uL — ABNORMAL HIGH (ref 4.0–10.5)
nRBC: 0 % (ref 0.0–0.2)

## 2020-01-12 MED ORDER — ASPIRIN 81 MG PO CHEW: 81.0000 mg | CHEWABLE_TABLET | Freq: Two times a day (BID) | ORAL | Status: DC

## 2020-01-12 MED ORDER — OXYCODONE HCL ER 15 MG PO T12A
15.0000 mg | EXTENDED_RELEASE_TABLET | Freq: Two times a day (BID) | ORAL | Status: DC
  Administered 2020-01-12 – 2020-01-15 (×7): 15 mg via ORAL
  Filled 2020-01-12 (×8): qty 1

## 2020-01-12 MED ORDER — OXYCODONE HCL ER 15 MG PO T12A: 15.0000 mg | EXTENDED_RELEASE_TABLET | Freq: Two times a day (BID) | ORAL | 0 refills | Status: DC

## 2020-01-12 MED ORDER — TRAMADOL HCL 50 MG PO TABS: 50.0000 mg | ORAL_TABLET | Freq: Four times a day (QID) | ORAL | 0 refills | Status: DC

## 2020-01-12 NOTE — TOC Progression Note (Signed)
Transition of Care [facility]) - Progression Note    Patient Details  Name: Rhonda Martin MRN: [id number] Date of Birth: 1976/06/02  Transition of Care [facility]) CM/SW [place], RN Phone Number: 01/12/2020, 2:40 PM  Clinical Narrative:   I explained to the patient that if she were to go to SNF she has to stay for 30 days and has to sign over her disability check to ofset the cost, she stated that she is not going to do that therefore she is not going to SNF, She has a RW at home and a cane and all other DME she needs, she stated that she is approved for wellcare already for [facility] for PT, Aide, SW, she stated she will go home with [facility], I notified [place] at [facility]    Expected Discharge Plan: [place]    Expected Discharge Plan and Services Expected Discharge Plan: [place] arrangements for the past 2 months: Single Family Home                                       Social Determinants of Health (SDOH) Interventions    Readmission Risk Interventions Readmission Risk Prevention Plan 11/29/2019 10/25/2019 01/21/2019  Post Dischage Appt - - Complete  Medication Screening - - Complete  Transportation Screening Complete Complete Complete  PCP or Specialist Appt within 3-5 Days - Complete -  HRI or Home Care Consult - Complete -  Medication Review (RN Care Manager) Complete Complete -  PCP or Specialist appointment within 3-5 days of discharge Complete - -  Palliative Care Screening Not Applicable - -  [place] Not Applicable - -

## 2020-01-12 NOTE — Progress Notes (Signed)
OT Cancellation Note  Patient Details Name: Rhonda Martin MRN: [id number] DOB: 1975-07-21   Cancelled Treatment:    Reason Eval/Treat Not Completed: Patient declined, no reason specified;Pain limiting ability to participate. On 2nd attempt, pt still lethargic, reporting 8/10 pain in R surgical knee and pain in R foot (chronic). RN in to give pt medication. Pt states "I just don't think I can take much more today" and cites her fatigue, inability to rest, and pain as limiting factors that makes therapy too difficult at this time. Pt agreeable to OT evaluation next date pending improved pain control.   Jeni Salles, MPH, MS, OTR/L ascom [phone] 01/12/20, 1:30 PM

## 2020-01-12 NOTE — TOC Progression Note (Signed)
Transition of Care [facility]) - Progression Note    Patient Details  Name: Rhonda Martin MRN: [id number] Date of Birth: 1976/02/24  Transition of Care [facility]) CM/SW [place], RN Phone Number: 01/12/2020, 9:32 AM  Clinical Narrative:    Called [facility] since the patient is not on the referral list, Provided the patient information for [facility]         Expected Discharge Plan and Services                                                 Social Determinants of Health (SDOH) Interventions    Readmission Risk Interventions Readmission Risk Prevention Plan 11/29/2019 10/25/2019 01/21/2019  Post Dischage Appt - - Complete  Medication Screening - - Complete  Transportation Screening Complete Complete Complete  PCP or Specialist Appt within 3-5 Days - Complete -  HRI or Home Care Consult - Complete -  Medication Review ([place]) Complete Complete -  PCP or Specialist appointment within 3-5 days of discharge Complete - -  Palliative Care Screening Not Applicable - -  [place] Not Applicable - -

## 2020-01-12 NOTE — Progress Notes (Signed)
  Subjective: 1 Day Post-Op Procedure(s) (LRB): Right Total Knee Arthroplasty (Right) Patient reports pain as moderate.   Patient is well, and has had no acute complaints or problems Plan is to go Home after hospital stay. Negative for chest pain and shortness of breath Fever: no Gastrointestinal: Negative for nausea and vomiting  Objective: Vital signs in last 24 hours: Temp:  [97.4 F (36.3 C)-99.1 F (37.3 C)] 98.5 F (36.9 C) (08/06 0458) Pulse Rate:  [86-108] 105 (08/06 0458) Resp:  [10-24] 16 (08/06 0458) BP: (105-149)/(53-83) 132/63 (08/06 0458) SpO2:  [92 %-99 %] 95 % (08/06 0458) Weight:  [60 kg] 60 kg (08/05 0827)  Intake/Output from previous day:  Intake/Output Summary (Last 24 hours) at 01/12/2020 0722 Last data filed at 01/12/2020 0649 Gross per 24 hour  Intake 1148.39 ml  Output 1630 ml  Net -481.61 ml    Intake/Output this shift: No intake/output data recorded.  Labs: Recent Labs    01/12/20 0546  HGB 9.9*   Recent Labs    01/12/20 0546  WBC 12.2*  RBC 3.53*  HCT 29.0*  PLT 185   Recent Labs    01/12/20 0546  NA 135  K 4.0  CL 102  CO2 25  BUN 10  CREATININE 0.45  GLUCOSE 192*  CALCIUM 8.0*   Recent Labs    01/11/20 0859  INR 1.5*     EXAM General - Patient is Alert and Oriented Extremity - Neurovascular intact Sensation intact distally Dorsiflexion/Plantar flexion intact Compartment soft Dressing/Incision - clean, dry, with wound VAC in place.  There is no Polar Care in the room.  We will get one applied. Motor Function - intact, moving foot and toes well on exam.   Past Medical History:  Diagnosis Date  . Alcohol abuse   . Anxiety   . Back injury   . Cervical cancer ([place])   . Charcot-Marie-Tooth disease   . COPD (chronic obstructive pulmonary disease) ([place])   . Family history of adverse reaction to anesthesia    PONV  . GERD (gastroesophageal reflux disease)   . Hepatitis    liver fibrosis, Hep C negative on 09/3019   . Hypertension   . Hypokalemia   . IDA (iron deficiency anemia) 06/26/2019  . Iron deficiency anemia   . Leg injury   . Liver cirrhosis ([place])   . Pneumonia   . Sepsis ([place]) 07/10/2019  . Symptomatic anemia 06/26/2019  . Thrombocytopenia ([facility])     Assessment/Plan: 1 Day Post-Op Procedure(s) (LRB): Right Total Knee Arthroplasty (Right) Active Problems:   S/P TKR (total knee replacement) using cement, right  Estimated body mass index is 24.19 kg/m as calculated from the following:   Height as of this encounter: 5\' 2"  (1.575 m).   Weight as of this encounter: 60 kg. Advance diet Up with therapy D/C IV fluids Discharge home with home health on Sunday  DVT Prophylaxis - Aspirin Weight-Bearing as tolerated to right leg  Reche Dixon, PA-C Orthopaedic Surgery 01/12/2020, 7:22 AM

## 2020-01-12 NOTE — TOC Initial Note (Signed)
Transition of Care [facility]) - Initial/Assessment Note    Patient Details  Name: Rhonda Martin MRN: [id number] Date of Birth: 1975/10/31  Transition of Care [facility]) CM/SW Contact:    Su Hilt, RN Phone Number: 01/12/2020, 2:29 PM  Clinical Narrative:         Spoke with the patient to discuss DC plan and needs, she lives at home with her husband, She is agreeable to go to rehab for short term, she has had 1 of the covid vaccines but not the 2nd dose, I explained that would mean she will be quarantined, she stated understanding, she agrees to a bedsearch          Expected Discharge Plan: [place]     Patient Goals and CMS Choice Patient states their goals for this hospitalization and ongoing recovery are:: get better      Expected Discharge Plan and Services Expected Discharge Plan: [place]       Living arrangements for the past 2 months: Single Family Home                                      Prior Living Arrangements/Services Living arrangements for the past 2 months: Single Family Home Lives with:: Spouse Patient language and need for interpreter reviewed:: Yes        Need for Family Participation in Patient Care: No (Comment) Care giver support system in place?: Yes (comment) Current home services: DME Criminal Activity/Legal Involvement Pertinent to Current Situation/Hospitalization: No - Comment as needed  Activities of Daily Living      Permission Sought/Granted   Permission granted to share information with : Yes, Verbal Permission Granted     Permission granted to share info w AGENCY: SNF        Emotional Assessment Appearance:: Appears older than stated age Attitude/Demeanor/Rapport: Engaged Affect (typically observed): Appropriate Orientation: : Oriented to Situation, Oriented to  Time, Oriented to Place, Oriented to Self Alcohol / Substance Use: Not Applicable Psych Involvement: No (comment)  Admission  diagnosis:  S/P TKR (total knee replacement) using cement, right [Z96.651] Patient Active Problem List   Diagnosis Date Noted  . S/P TKR (total knee replacement) using cement, 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  . Pancytopenia ([place])   . Hypomagnesemia   . GI bleeding 11/24/2019  . AKI (acute kidney injury) ([place])   . GIB (gastrointestinal bleeding)   . Elevated LFTs 10/24/2019  . Hyperbilirubinemia 10/24/2019  . Hematemesis 10/23/2019  . Acute esophagitis   . 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 Neutropenic fever ([place])   . Lobar pneumonia ([place])   . Hepatic encephalopathy ([place]) 08/23/2019  . Hypotension   . Hallucinations 08/22/2019  . Acute respiratory failure with hypoxia ([place]) 08/22/2019  . Acute metabolic encephalopathy 81/44/8185  . Acute hepatic encephalopathy 08/22/2019  . Sepsis ([place]) 07/10/2019  . CAP (community acquired pneumonia) 07/10/2019  . Hyponatremia 07/10/2019  . Hypokalemia 07/10/2019  . Anxiety 07/10/2019  . COPD (chronic obstructive pulmonary disease) ([place]) 07/10/2019  . HTN (hypertension) 07/10/2019  . Liver cirrhosis ([place])   . Symptomatic anemia 06/26/2019  . IDA (iron deficiency anemia) 06/26/2019  . Insomnia 04/03/2019  . Tachycardia 04/03/2019  . Chronic pain of right knee 03/31/2019  . Bilateral leg edema 02/06/2019  . Generalized anxiety disorder 01/23/2019  . 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 Alcohol withdrawal delirium ([place]) 01/20/2019  . Chronic hepatitis  C without hepatic coma ([place]) 12/20/2018  . Thrombocytopenia ([place]) 12/20/2018  . Alcohol abuse 12/20/2018  . Transaminitis 12/20/2018  . Tobacco abuse 12/20/2018  . Easy bruising 12/20/2018  . Weakness 12/20/2018  . Folate deficiency 12/20/2018  . Hepatitis B core antibody negative 12/20/2018  . Charcot-Marie-Tooth disease    PCP:  Volney American, PA-C Pharmacy:   [id number] - [place], [place] [address] Phone: [phone] Fax:  [phone]     Social Determinants of Health (SDOH) Interventions    Readmission Risk Interventions Readmission Risk Prevention Plan 11/29/2019 10/25/2019 01/21/2019  Post Dischage Appt - - Complete  Medication Screening - - Complete  Transportation Screening Complete Complete Complete  PCP or Specialist Appt within 3-5 Days - Complete -  HRI or Home Care Consult - Complete -  Medication Review ([place]) Complete Complete -  PCP or Specialist appointment within 3-5 days of discharge Complete - -  Palliative Care Screening Not Applicable - -  [place] Not Applicable - -

## 2020-01-12 NOTE — Evaluation (Signed)
Physical Therapy Evaluation Patient Details Name: Rhonda Martin MRN: [id number] DOB: 1975/10/24 Today's Date: 01/12/2020   History of Present Illness  44 y/o female s/p R TKA 01/12/20. History includes liver cirrhosis, alcohol abuse, HTN, COPD, Charcot-Marie Tooth disease, and chronic hep C.  Clinical Impression  Pt with limited first effort with PT per typical POD1 TKA sessions.  She is extremely pain limited (and focused) and ultimately tolerated only very light and guarded exercises and mobility.  She was unwilling to put weight through R LE in standing; we did get to the recliner but after pt used purewick to relieve herself she threw it back on the bed and started to try to get back to bed w/o walker, w/o PT and w/ c/o great pain made to get back to bed.  PT stopped her and encouraged her to try to stay in recliner for at least an hour but she would have none of it and demanded to get back to bed.  With cuing to try she did place (maybe) a few pounds on ball of R foot in getting back to bed but did not do anything that could be considered ambulation with R LE.  She tolerated very little exercises and even with plenty of cuing, slow and gentle reps and constant cues to calm her breathing she simply kept stating that she couldn't do any more and that she would try when pain allowed her in the future.  Difficult eval and poor overall tolerance with all aspects of PT.     Follow Up Recommendations Follow surgeon's recommendation for DC plan and follow-up therapies;Supervision/Assistance - 24 hour    Equipment Recommendations  None recommended by PT    Recommendations for Other Services       Precautions / Restrictions Precautions Precautions: Fall;Knee Precaution Comments: per surgeon does not need to have heel elevated first few days post-op Restrictions Weight Bearing Restrictions: Yes RLE Weight Bearing: Weight bearing as tolerated      Mobility  Bed Mobility Overal bed mobility: Needs  Assistance Bed Mobility: Supine to Sit     Supine to sit: Min assist     General bed mobility comments: Pt unable to move R LE toward EOB, very slow, hyper-cautious assist to ease R LE off EOB and transition to sitting  Transfers Overall transfer level: Needs assistance Equipment used: Rolling walker (2 wheeled) Transfers: Sit to/from Stand Sit to Stand: Min assist         General transfer comment: Pt was able to rise to standing w/o heavy PT assist, however she did not put weight on R LE and called out in pain and agony with any effort  Ambulation/Gait             General Gait Details: Pt unwilling to put more than a pound or 2 of pressure through R LE and managed highly UE reliant hopping turn to and from recliner.  No real ambulation or motivated to do so despite heavy cuing and encouragement.  Stairs            Wheelchair Mobility    Modified Rankin (Stroke Patients Only)       Balance Overall balance assessment: Needs assistance Sitting-balance support: Bilateral upper extremity supported Sitting balance-Leahy Scale: Fair     Standing balance support: Bilateral upper extremity supported Standing balance-Leahy Scale: Fair  Pertinent Vitals/Pain Pain Assessment: 0-10 Pain Score: 9  Pain Location: entire R LE    Home Living Family/patient expects to be discharged to:: Private residence Living Arrangements: Spouse/significant other;Children Available Help at Discharge: Family;Available 24 hours/day Type of Home: Mobile home Home Access: Stairs to enter Entrance Stairs-Rails: Left Entrance Stairs-Number of Steps: 4 Home Layout: One level Home Equipment: Clinical cytogeneticist - 2 wheels;Cane - single point;Wheelchair - manual      Prior Function Level of Independence: Independent with assistive device(s)         Comments: Ambulates with cane vs RW depending on how she is feeling.  H/o multiple falls.      Hand Dominance        Extremity/Trunk Assessment   Upper Extremity Assessment Upper Extremity Assessment: Generalized weakness    Lower Extremity Assessment Lower Extremity Assessment: Generalized weakness (extremely limited tolerance to any movement on R)       Communication   Communication: No difficulties  Cognition Arousal/Alertness: Awake/alert Behavior During Therapy: Anxious;Restless Overall Cognitive Status:  (Pt hyper pain responsive)                                        General Comments General comments (skin integrity, edema, etc.): Pt excessively pain focused and self-limiting, despite review of typical expectations, etc she simply did not tolerate much and consistently declines even very modest exercises and activities    Exercises Total Joint Exercises Ankle Circles/Pumps: AROM;5 reps Quad Sets: AAROM;10 reps Heel Slides: PROM (pt tolerates 1 1/2 reps) Hip ABduction/ADduction: PROM;10 reps Straight Leg Raises:  (unable and unwilling to try) Goniometric ROM: 8-58 with poor tolerance/screaming out in pain    Assessment/Plan    PT Assessment Patient needs continued PT services  PT Problem List Decreased strength;Decreased range of motion;Decreased activity tolerance;Decreased balance;Decreased mobility;Decreased coordination;Decreased knowledge of use of DME;Decreased safety awareness;Pain       PT Treatment Interventions DME instruction;Gait training;Stair training;Functional mobility training;Therapeutic activities;Therapeutic exercise;Balance training;Neuromuscular re-education;Patient/family education    PT Goals (Current goals can be found in the Care Plan section)  Acute Rehab PT Goals Patient Stated Goal: get pain under control PT Goal Formulation: With patient Time For Goal Achievement: 01/26/20 Potential to Achieve Goals: Fair    Frequency BID   Barriers to discharge        Co-evaluation                AM-PAC PT "6 Clicks" Mobility  Outcome Measure Help needed turning from your back to your side while in a flat bed without using bedrails?: A Little Help needed moving from lying on your back to sitting on the side of a flat bed without using bedrails?: A Little Help needed moving to and from a bed to a chair (including a wheelchair)?: A Lot Help needed standing up from a chair using your arms (e.g., wheelchair or bedside chair)?: A Little Help needed to walk in hospital room?: A Lot Help needed climbing 3-5 steps with a railing? : Total 6 Click Score: 14    End of Session Equipment Utilized During Treatment: Gait belt Activity Tolerance: Patient limited by pain Patient left: with bed alarm set;with call bell/phone within reach   PT Visit Diagnosis: Muscle weakness (generalized) (M62.81);Difficulty in walking, not elsewhere classified (R26.2);Pain Pain - Right/Left: Right Pain - part of body: Knee    Time: 1610-9604 PT Time Calculation (min) (ACUTE  ONLY): 31 min   Charges:   PT Evaluation $PT Eval Low Complexity: 1 Low PT Treatments $Therapeutic Exercise: 8-22 mins        Kreg Shropshire, DPT 01/12/2020, 11:13 AM

## 2020-01-12 NOTE — Progress Notes (Signed)
OT Cancellation Note  Patient Details Name: Rhonda Martin MRN: [id number] DOB: 07-21-1975   Cancelled Treatment:    Reason Eval/Treat Not Completed: Fatigue/lethargy limiting ability to participate;Patient declined, no reason specified. Consult received, chart reviewed. Pt presented in bed with covers over her head. Mumbles to therapist that she is attempting to rest and despite encouragement continues to decline therapy at this time. Has not touched her breakfast tray. Will re-attempt OT evaluation at later time as pt is willing to participate.   Jeni Salles, MPH, MS, OTR/L ascom [phone] 01/12/20, 11:37 AM

## 2020-01-12 NOTE — TOC Progression Note (Signed)
Transition of Care [facility]) - Progression Note    Patient Details  Name: Rhonda Martin MRN: [id number] Date of Birth: 29-Sep-1975  Transition of Care [facility]) CM/SW Contact  Su Hilt, RN Phone Number: 01/12/2020, 3:16 PM  Clinical Narrative:    Marye Round with [facility] has let me know they will not accept the patient for [facility] services, I notified the physician  Expected Discharge Plan: [place]    Expected Discharge Plan and Services Expected Discharge Plan: [place]       Living arrangements for the past 2 months: Single Family Home                                       Social Determinants of Health (SDOH) Interventions    Readmission Risk Interventions Readmission Risk Prevention Plan 11/29/2019 10/25/2019 01/21/2019  Post Dischage Appt - - Complete  Medication Screening - - Complete  Transportation Screening Complete Complete Complete  PCP or Specialist Appt within 3-5 Days - Complete -  HRI or [place] - Complete -  Medication Review (RN Care Manager) Complete Complete -  PCP or Specialist appointment within 3-5 days of discharge Complete - -  Palliative Care Screening Not Applicable - -  [place] Not Applicable - -

## 2020-01-12 NOTE — Progress Notes (Signed)
Physical Therapy Treatment Patient Details Name: Rhonda Martin MRN: [id number] DOB: 10-Nov-1975 Today's Date: 01/12/2020    History of Present Illness 44 y/o female s/p R TKA 01/12/20. History includes liver cirrhosis, alcohol abuse, HTN, COPD, Charcot-Marie Tooth disease, and chronic hep C.    PT Comments    Pt continues to be very pain limited, but did show better tolerance and effort this afternoon (she reports dhe does better in the afternoons).  Pt unable to put any real weight through R LE this AM, but did make genuine effort to put heel down on R and to initiate some WBing, however minimal.  She also showed limited increased tolrance to exercises but remains very pain focused with all ROM (active, assisted and passive) involving the R knee.  Overall pt is making extremely slow progress per expected TKA expectations secondary to pain.   Follow Up Recommendations  Follow surgeon's recommendation for DC plan and follow-up therapies;Supervision/Assistance - 24 hour     Equipment Recommendations  None recommended by PT    Recommendations for Other Services       Precautions / Restrictions Precautions Precautions: Fall;Knee Precaution Booklet Issued: Yes (comment) (HEP) Restrictions RLE Weight Bearing: Weight bearing as tolerated    Mobility  Bed Mobility Overal bed mobility: Needs Assistance Bed Mobility: Supine to Sit;Sit to Supine     Supine to sit: Min assist Sit to supine: Mod assist   General bed mobility comments: Pt makes more effort than this AM with getting to EOB on her own, but ultimately has too much pain and does need assist with R LE in and out of bed  Transfers Overall transfer level: Needs assistance Equipment used: Rolling walker (2 wheeled) Transfers: Sit to/from Stand Sit to Stand: Min assist         General transfer comment: Pt did better with attempting to at least put some weight on R LE but continues to be very hesistant.  Once up in walker she  was able to at least place heel down with a lot of guarding and encouragement.  She did the vast majority of the effort to rise but still needed light assist to initiate movement and insure continued momentum to fully upright.  Ambulation/Gait Ambulation/Gait assistance: Min assist;Min guard Gait Distance (Feet): 10 Feet Assistive device: Rolling walker (2 wheeled)       General Gait Details: Pt did manage 2 bouts of modest ambulation (~10 ft each) in the room.  With very heavy cuing she did attempt to put more and more weight through R but still hardly placed heel down and could not have been doing more than 10 lbs through R.  Pt impulsive but did show relatively good balance despite the antalgic, hopping, guarded effort.   Stairs             Wheelchair Mobility    Modified Rankin (Stroke Patients Only)       Balance Overall balance assessment: Needs assistance Sitting-balance support: Bilateral upper extremity supported Sitting balance-Leahy Scale: Good Sitting balance - Comments: Pt able to maintain sitting EOB well despite calling out in pain much of the time     Standing balance-Leahy Scale: Fair Standing balance comment: reliant on UEs/walker and L LE, hardly placing any weight through R but able to maintain balance reasonably well                            Cognition Arousal/Alertness: Awake/alert Behavior  During Therapy: Anxious;Restless Overall Cognitive Status: Within Functional Limits for tasks assessed                                        Exercises Total Joint Exercises Ankle Circles/Pumps: AROM;5 reps Quad Sets: AAROM;10 reps Heel Slides: PROM;5 reps Hip ABduction/ADduction: PROM;10 reps Straight Leg Raises: AAROM;5 reps (very poor tolerance) Knee Flexion: PROM (3 reps)    General Comments        Pertinent Vitals/Pain Pain Assessment: 0-10 Pain Score: 9  Pain Location: entire R LE    Home Living                       Prior Function            PT Goals (current goals can now be found in the care plan section) Progress towards PT goals: Progressing toward goals    Frequency    BID      PT Plan Current plan remains appropriate    Co-evaluation              AM-PAC PT "6 Clicks" Mobility   Outcome Measure  Help needed turning from your back to your side while in a flat bed without using bedrails?: A Little Help needed moving from lying on your back to sitting on the side of a flat bed without using bedrails?: A Little Help needed moving to and from a bed to a chair (including a wheelchair)?: A Lot Help needed standing up from a chair using your arms (e.g., wheelchair or bedside chair)?: A Little Help needed to walk in hospital room?: A Lot Help needed climbing 3-5 steps with a railing? : Total 6 Click Score: 14    End of Session Equipment Utilized During Treatment: Gait belt Activity Tolerance: Patient limited by pain Patient left: with bed alarm set;with call bell/phone within reach Nurse Communication: Mobility status;Patient requests pain meds PT Visit Diagnosis: Muscle weakness (generalized) (M62.81);Difficulty in walking, not elsewhere classified (R26.2);Pain Pain - Right/Left: Right Pain - part of body: Knee     Time: 9201-0071 PT Time Calculation (min) (ACUTE ONLY): 32 min  Charges:  $Gait Training: 8-22 mins $Therapeutic Exercise: 8-22 mins                     Kreg Shropshire, DPT 01/12/2020, 5:14 PM

## 2020-01-13 MED ORDER — OXYCODONE HCL 5 MG PO TABS
10.0000 mg | ORAL_TABLET | ORAL | Status: DC
  Administered 2020-01-13 – 2020-01-16 (×13): 10 mg via ORAL
  Filled 2020-01-13 (×16): qty 2

## 2020-01-13 MED ORDER — TRAMADOL HCL 50 MG PO TABS
50.0000 mg | ORAL_TABLET | Freq: Four times a day (QID) | ORAL | Status: DC | PRN
  Administered 2020-01-13: 50 mg via ORAL
  Filled 2020-01-13: qty 1

## 2020-01-13 MED ORDER — TRAMADOL HCL 50 MG PO TABS: 50.0000 mg | ORAL_TABLET | Freq: Four times a day (QID) | ORAL | Status: DC

## 2020-01-13 NOTE — Progress Notes (Signed)
Physical Therapy Treatment Patient Details Name: Rhonda Martin MRN: [id number] DOB: 1975-10-26 Today's Date: 01/13/2020    History of Present Illness 44 y/o female s/p R TKA 01/12/20. History includes liver cirrhosis, alcohol abuse, HTN, COPD, Charcot-Marie Tooth disease, and chronic hep C.    PT Comments    Pt was long sitting in bed upon arriving. 2nd time therapist returned this AM. She endorses severe pain 8/10 at rest even with pain meds issued. She agrees to PT session however throughout session likes to perform things her way. Unable to consistently follow desired task requested. She is gets anxious and occasionally yells out in pain even without wt bearing/movements. Lots of redirecting required for pt to focus on desired task. Pt fixated on finding her cell phone. Pt was able to exit R side of bed with min assist. No physical assistance to return to long sit after OOB activity from EOB. Pt performed STS ~ 5 x throughout session. EOB>BSC 2x>recliner>bed. Pt unable to sit still or tolerate seated in recliner. She I'ly transfers to bed from recliner without assist or use of RW with squat pivot.IMPULSIVE/unsafe. Unable to tolerate wt bearing on RLE. Performed "hopping/ PWB to ambulate ~ 5 ft to [facility]. Max encouragement to perform gait training to improve safety however pt unwilling. Pt did have improved AROM when seated EOB but also limited by pain.   Pt lacks ~ 10-20 degrees knee ext and was able to tolerate flexion to ~ 74 degrees. Hard to get formal measurement 2/2 to pt unwillingness to sit still/ pain. Overall pt is progressing but is more self limiting and limited by pain than anything. Therapist feels pt would benefit form skilled PT at SNF however pt is unable. At conclusion of session, pt is long sitting in bed with bed alarm in place and call bell in reach. RN aware of pt's abilities.       Follow Up Recommendations  Follow surgeon's recommendation for DC plan and follow-up  therapies;Supervision/Assistance - 24 hour;Other (comment) (pt unable to go to SNF or have HHPT)     Equipment Recommendations  None recommended by PT    Recommendations for Other Services       Precautions / Restrictions Precautions Precautions: Fall;Knee Precaution Booklet Issued: No Precaution Comments: per surgeon does not need to have heel elevated first few days post-op Restrictions Weight Bearing Restrictions: Yes RLE Weight Bearing: Weight bearing as tolerated    Mobility  Bed Mobility Overal bed mobility: Needs Assistance Bed Mobility: Supine to Sit;Sit to Supine     Supine to sit: Min assist Sit to supine: Supervision   General bed mobility comments: Deferred. Pt declines all mobility/offer for functional activity.  Transfers Overall transfer level: Needs assistance Equipment used: Rolling walker (2 wheeled) Transfers: Sit to/from Stand Sit to Stand: Min guard;Supervision         General transfer comment: pt at first required CGA for safety however pt was able to stand pivot/squat pivot without assistance byuy the end of session.  Ambulation/Gait Ambulation/Gait assistance: Min guard Gait Distance (Feet): 5 Feet Assistive device: Rolling walker (2 wheeled) Gait Pattern/deviations: Step-to pattern;Antalgic;Trunk flexed;Decreased step length - right;Decreased step length - left Gait velocity: decreased   General Gait Details: poor ability to wt bear on RLE. max vcs throughout for improved technique. pt with poor ability to follow desired commands and prefers to perform mobility/transfers/gait her way. pt reports severe pain throughout session that limited pt's abilities.    Stairs  Wheelchair Mobility    Modified Rankin (Stroke Patients Only)       Balance Overall balance assessment: Needs assistance                                          Cognition Arousal/Alertness: Awake/alert Behavior During Therapy:  Anxious;Restless Overall Cognitive Status: Within Functional Limits for tasks assessed                                 General Comments: pt is Alert throughout session however impulsive and tearful at times with mobility. reports pain is too much to tolerate but continued to participate.      Exercises Total Joint Exercises Goniometric ROM: 12-74 degrees Other Exercises Other Exercises: Pt educated on role of OT in acute setting, safe use of AE/DME, falls prevention strategies, complementary alternative pain management strategies, and polar care management strategies.    General Comments General comments (skin integrity, edema, etc.): Pt is extremely self-limited and hyper-focused on pain. She declines activity despite education on importance of early mobility after sx.      Pertinent Vitals/Pain Pain Assessment: 0-10 Pain Score: 10-Worst pain ever Pain Location: entire R LE Pain Descriptors / Indicators: Constant;Grimacing;Guarding;Sore;Sharp Pain Intervention(s): Limited activity within patient's tolerance;Monitored during session;Premedicated before session;Utilized relaxation techniques;Ice applied    Home Living Family/patient expects to be discharged to:: Private residence Living Arrangements: Spouse/significant other;Children Available Help at Discharge: Family;Available 24 hours/day Type of Home: Mobile home Home Access: Stairs to enter Entrance Stairs-Rails: Left Home Layout: One level Home Equipment: Clinical cytogeneticist - 2 wheels;Cane - single point;Wheelchair - manual      Prior Function Level of Independence: Independent with assistive device(s)      Comments: Ambulates with cane vs RW depending on how she is feeling.  H/o multiple falls.   PT Goals (current goals can now be found in the care plan section) Acute Rehab PT Goals Patient Stated Goal: get pain under control Progress towards PT goals: Progressing toward goals;Not progressing toward goals  - comment (pt limited progress 2/2 to pain/ cooperation)    Frequency    BID      PT Plan Current plan remains appropriate    Co-evaluation              AM-PAC PT "6 Clicks" Mobility   Outcome Measure  Help needed turning from your back to your side while in a flat bed without using bedrails?: A Little Help needed moving from lying on your back to sitting on the side of a flat bed without using bedrails?: A Little Help needed moving to and from a bed to a chair (including a wheelchair)?: A Little Help needed standing up from a chair using your arms (e.g., wheelchair or bedside chair)?: A Little Help needed to walk in hospital room?: A Little Help needed climbing 3-5 steps with a railing? : A Little 6 Click Score: 18    End of Session Equipment Utilized During Treatment: Gait belt Activity Tolerance: Patient limited by pain Patient left: with bed alarm set;with call bell/phone within reach Nurse Communication: Mobility status PT Visit Diagnosis: Muscle weakness (generalized) (M62.81);Difficulty in walking, not elsewhere classified (R26.2);Pain Pain - Right/Left: Right Pain - part of body: Knee     Time: 3846-6599 PT Time Calculation (min) (ACUTE ONLY): 38 min  Charges:  $  Gait Training: 8-22 mins $Therapeutic Exercise: 8-22 mins $Therapeutic Activity: 23-37 mins                     Julaine Fusi PTA 01/13/20, 1:37 PM

## 2020-01-13 NOTE — Progress Notes (Signed)
  Subjective: 2 Days Post-Op Procedure(s) (LRB): Right Total Knee Arthroplasty (Right) Patient reports pain as moderate.   Patient is well, and has had no acute complaints or problems Plan is to go Home after hospital stay. Patient is unable to go to rehab and insurance will no approve HHPT. Plan will be for discharge home and then begin outpatient PT. Negative for chest pain and shortness of breath Fever: no Gastrointestinal: Negative for nausea and vomiting  Objective: Vital signs in last 24 hours: Temp:  [98.7 F (37.1 C)-98.9 F (37.2 C)] 98.9 F (37.2 C) (08/07 0751) Pulse Rate:  [89-105] 89 (08/07 0751) Resp:  [16-20] 16 (08/07 0751) BP: (115-136)/(57-83) 128/63 (08/07 0751) SpO2:  [98 %-99 %] 99 % (08/07 0751)  Intake/Output from previous day:  Intake/Output Summary (Last 24 hours) at 01/13/2020 1020 Last data filed at 01/13/2020 0700 Gross per 24 hour  Intake 0 ml  Output 1950 ml  Net -1950 ml    Intake/Output this shift: No intake/output data recorded.  Labs: Recent Labs    01/12/20 0546  HGB 9.9*   Recent Labs    01/12/20 0546  WBC 12.2*  RBC 3.53*  HCT 29.0*  PLT 185   Recent Labs    01/12/20 0546  NA 135  K 4.0  CL 102  CO2 25  BUN 10  CREATININE 0.45  GLUCOSE 192*  CALCIUM 8.0*   Recent Labs    01/11/20 0859  INR 1.5*     EXAM General - Patient is Alert and Oriented Extremity - Neurovascular intact Sensation intact distally Dorsiflexion/Plantar flexion intact Compartment soft Dressing/Incision - clean, dry, with wound VAC in place.  Minimal bloody drainage from the knee.  Polar care applied to the right knee. Motor Function - intact, moving toes well on exam. Patient has undergone surgery to the right foot and ankle in the past so she has very limited motion. Negative homans to the right leg.  Past Medical History:  Diagnosis Date  . Alcohol abuse   . Anxiety   . Back injury   . Cervical cancer ([place])   . Charcot-Marie-Tooth  disease   . COPD (chronic obstructive pulmonary disease) ([place])   . Family history of adverse reaction to anesthesia    PONV  . GERD (gastroesophageal reflux disease)   . Hepatitis    liver fibrosis, Hep C negative on 09/3019  . Hypertension   . Hypokalemia   . IDA (iron deficiency anemia) 06/26/2019  . Iron deficiency anemia   . Leg injury   . Liver cirrhosis ([place])   . Pneumonia   . Sepsis ([place]) 07/10/2019  . Symptomatic anemia 06/26/2019  . Thrombocytopenia ([facility])    Assessment/Plan: 2 Days Post-Op Procedure(s) (LRB): Right Total Knee Arthroplasty (Right) Active Problems:   S/P TKR (total knee replacement) using cement, right  Estimated body mass index is 24.19 kg/m as calculated from the following:   Height as of this encounter: 5\' 2"  (1.575 m).   Weight as of this encounter: 60 kg. Advance diet Up with therapy D/C IV fluids Discharge home with home health   Patient with moderate pain to the right knee.  Pain meds adjusted. Continue with PT today. Patient will not be able to have HHPT or SNF, will need to remain in house until safe to discharge home on her own.  DVT Prophylaxis - Aspirin Weight-Bearing as tolerated to right leg  J. Cameron Proud, PA-C Orthopaedic Surgery 01/13/2020, 10:20 AM

## 2020-01-13 NOTE — Progress Notes (Addendum)
PT Cancellation Note  Patient Details Name: Rhonda Martin MRN: [id number] DOB: Jan 09, 1976   Cancelled Treatment:     PT attempt x 3 this afternoon. Pt refused stating," I just can't do it right now. I'm having way too much pain." Therapist discussed importance of PT and continued progression however pt still unwilling. Pt is self limiting. Will return next date to try to progress towards PT goals.     Willette Pa 01/13/2020, 2:50 PM

## 2020-01-13 NOTE — Evaluation (Signed)
Occupational Therapy Evaluation Patient Details Name: Rhonda Martin MRN: [id number] DOB: 1975/11/04 Today's Date: 01/13/2020    History of Present Illness 44 y/o female s/p R TKA 01/12/20. History includes liver cirrhosis, alcohol abuse, HTN, COPD, Charcot-Marie Tooth disease, and chronic hep C.   Clinical Impression   Ms Kashani was seen for OT evaluation this date, POD#2 from above surgery. PT lives with her spouse in a mobile home with 4 STE. Pt reports PTA she required some assistance from her spouse to perform BADL management including tub/shower transfers, and with managing small fasteners/hooks on clothing 2/2 progressing Charcot-Marie Tooth symptoms which she reports affects BUE/BLE. Pt is generally self-limited by pain t/o session. Despite having pain medication prior to OT evaluation, she yells out loudly when OT moves blanket off of her operative leg and declines all functional mobility during this session. Pt currently requires MOD/MAX assist for functional mobility as well as MAX assist. LB dressing while in seated position due to pain and limited AROM of R knee. Pt instructed in polar care mgt, falls prevention strategies, home/routines modifications, DME/AE for LB bathing and dressing tasks, and complementary alternative methods for pain management including distraction techniques, deep breathing, and gentle self massage. Pt would benefit from skilled OT services including additional instruction in dressing techniques with or without assistive devices for dressing and bathing skills to support recall and carryover prior to discharge and ultimately to maximize safety, independence, and minimize falls risk and caregiver burden. Recommend STR upon hospital DC to maximize pt safety and return to PLOF.    Follow Up Recommendations  Supervision/Assistance - 24 hour;SNF    Equipment Recommendations  3 in 1 bedside commode    Recommendations for Other Services       Precautions /  Restrictions Precautions Precautions: Fall;Knee Precaution Booklet Issued: No Precaution Comments: per surgeon does not need to have heel elevated first few days post-op Restrictions Weight Bearing Restrictions: Yes RLE Weight Bearing: Weight bearing as tolerated      Mobility Bed Mobility Overal bed mobility: Needs Assistance             General bed mobility comments: Deferred. Pt declines all mobility/offer for functional activity.  Transfers                      Balance Overall balance assessment: Needs assistance                                         ADL either performed or assessed with clinical judgement   ADL Overall ADL's : Needs assistance/impaired                                       General ADL Comments: Pt significantly limited by increased pain, decreased activity tolerance, and anxiety regarding mobility/pain with mobility. She requires assist for ADL management at baseline from caregivers including assist to fasten small hooks/buttons such as a bra/shirt. She requires MAX A for LB ADL management in seated position 2/2 increased pain and decreased AROM of her RLE.     Vision Baseline Vision/History: Wears glasses (contacts) Wears Glasses: At all times Patient Visual Report: No change from baseline       Perception     Praxis      Pertinent Vitals/Pain Pain Assessment:  0-10 Pain Score: 10-Worst pain ever Pain Location: entire R LE Pain Descriptors / Indicators: Constant;Grimacing;Guarding;Sore;Sharp Pain Intervention(s): Limited activity within patient's tolerance;Monitored during session;Premedicated before session;Utilized relaxation techniques;Ice applied     Hand Dominance Right   Extremity/Trunk Assessment Upper Extremity Assessment Upper Extremity Assessment: Generalized weakness (Pt endorses CMT affects hands and has noted ulnar deviation in BUE (2-4th digits))   Lower Extremity  Assessment Lower Extremity Assessment: Generalized weakness;RLE deficits/detail RLE Deficits / Details: s/p R TKA       Communication Communication Communication: No difficulties   Cognition Arousal/Alertness: Awake/alert Behavior During Therapy: Anxious;Restless Overall Cognitive Status: Within Functional Limits for tasks assessed                                     General Comments  Pt is extremely self-limited and hyper-focused on pain. She declines activity despite education on importance of early mobility after sx.    Exercises Other Exercises Other Exercises: Pt educated on role of OT in acute setting, safe use of AE/DME, falls prevention strategies, complementary alternative pain management strategies, and polar care management strategies.   Shoulder Instructions      Home Living Family/patient expects to be discharged to:: Private residence Living Arrangements: Spouse/significant other;Children Available Help at Discharge: Family;Available 24 hours/day Type of Home: Mobile home Home Access: Stairs to enter Entrance Stairs-Number of Steps: 4 Entrance Stairs-Rails: Left Home Layout: One level     Bathroom Shower/Tub: Occupational psychologist: Standard Bathroom Accessibility: Yes   Home Equipment: Clinical cytogeneticist - 2 wheels;Cane - single point;Wheelchair - manual          Prior Functioning/Environment Level of Independence: Independent with assistive device(s)        Comments: Ambulates with cane vs RW depending on how she is feeling.  H/o multiple falls.        OT Problem List: Decreased strength;Decreased coordination;Pain;Decreased range of motion;Decreased activity tolerance;Decreased safety awareness;Impaired balance (sitting and/or standing);Decreased knowledge of use of DME or AE;Decreased knowledge of precautions      OT Treatment/Interventions: Self-care/ADL training;Therapeutic activities;Therapeutic exercise;DME and/or  AE instruction;Patient/family education;Balance training;Energy conservation    OT Goals(Current goals can be found in the care plan section) Acute Rehab OT Goals Patient Stated Goal: get pain under control OT Goal Formulation: With patient Time For Goal Achievement: 01/27/20 Potential to Achieve Goals: Fair ADL Goals Pt Will Perform Grooming: with modified independence;sitting Pt Will Perform Lower Body Dressing: with min guard assist;sit to/from stand;with adaptive equipment;with caregiver independent in assisting (c LRAD PRN for improved safety and functional indep upon hospital DC.) Pt Will Transfer to Toilet: with min guard assist;ambulating;bedside commode (c LRAD PRN for improved safety and functional indep upon hospital DC.) Additional ADL Goal #1: Pt will independently verbalize a plan to implement at least 3 learned falls prevention strategies into her daily routines/home environment for improved safety and functional indep upon hospital DC.  OT Frequency: Min 1X/week   Barriers to D/C: Inaccessible home environment          Co-evaluation              AM-PAC OT "6 Clicks" Daily Activity     Outcome Measure Help from another person eating meals?: A Little Help from another person taking care of personal grooming?: A Little Help from another person toileting, which includes using toliet, bedpan, or urinal?: A Lot Help from another person bathing (including  washing, rinsing, drying)?: A Lot Help from another person to put on and taking off regular upper body clothing?: A Little Help from another person to put on and taking off regular lower body clothing?: A Lot 6 Click Score: 15   End of Session    Activity Tolerance: Other (comment);Patient limited by pain (self-limited) Patient left: in bed;with call bell/phone within reach;with bed alarm set  OT Visit Diagnosis: Other abnormalities of gait and mobility (R26.89);Muscle weakness (generalized) (M62.81);Pain Pain -  Right/Left: Right Pain - part of body: Knee;Leg;Ankle and joints of foot                Time: 1448-1856 OT Time Calculation (min): 20 min Charges:  OT General Charges $OT Visit: 1 Visit OT Evaluation $OT Eval Moderate Complexity: 1 Mod OT Treatments $Self Care/Home Management : 8-22 mins  Shara Blazing, M.S., OTR/L Ascom: [phone] 01/13/20, 11:08 AM

## 2020-01-14 ENCOUNTER — Inpatient Hospital Stay: Payer: Medicaid Other

## 2020-01-14 DIAGNOSIS — R000 Tachycardia, unspecified: Secondary | ICD-10-CM

## 2020-01-14 LAB — CBC
HCT: 26.5 % — ABNORMAL LOW (ref 36.0–46.0)
Hemoglobin: 8.8 g/dL — ABNORMAL LOW (ref 12.0–15.0)
MCH: 27.1 pg (ref 26.0–34.0)
MCHC: 33.2 g/dL (ref 30.0–36.0)
MCV: 81.5 fL (ref 80.0–100.0)
Platelets: 154 10*3/uL (ref 150–400)
RBC: 3.25 MIL/uL — ABNORMAL LOW (ref 3.87–5.11)
RDW: 21.9 % — ABNORMAL HIGH (ref 11.5–15.5)
WBC: 12 10*3/uL — ABNORMAL HIGH (ref 4.0–10.5)
nRBC: 0 % (ref 0.0–0.2)

## 2020-01-14 LAB — COMPREHENSIVE METABOLIC PANEL
ALT: 20 U/L (ref 0–44)
AST: 41 U/L (ref 15–41)
Albumin: 2.5 g/dL — ABNORMAL LOW (ref 3.5–5.0)
Alkaline Phosphatase: 98 U/L (ref 38–126)
Anion gap: 11 (ref 5–15)
BUN: 10 mg/dL (ref 6–20)
CO2: 22 mmol/L (ref 22–32)
Calcium: 7.8 mg/dL — ABNORMAL LOW (ref 8.9–10.3)
Chloride: 100 mmol/L (ref 98–111)
Creatinine, Ser: 0.52 mg/dL (ref 0.44–1.00)
GFR calc Af Amer: >60 mL/min (ref >60)
GFR calc non Af Amer: >60 mL/min (ref >60)
Glucose, Bld: 158 mg/dL — ABNORMAL HIGH (ref 70–99)
Potassium: 4.4 mmol/L (ref 3.5–5.1)
Sodium: 133 mmol/L — ABNORMAL LOW (ref 135–145)
Total Bilirubin: 1.5 mg/dL — ABNORMAL HIGH (ref 0.3–1.2)
Total Protein: 6 g/dL — ABNORMAL LOW (ref 6.5–8.1)

## 2020-01-14 LAB — URINALYSIS, COMPLETE (UACMP) WITH MICROSCOPIC
Bilirubin Urine: NEGATIVE
Glucose, UA: NEGATIVE mg/dL
Hgb urine dipstick: NEGATIVE
Ketones, ur: NEGATIVE mg/dL
Nitrite: NEGATIVE
Protein, ur: NEGATIVE mg/dL
Specific Gravity, Urine: 1.013 (ref 1.005–1.030)
pH: 6 (ref 5.0–8.0)

## 2020-01-14 LAB — MAGNESIUM: Magnesium: 1.7 mg/dL (ref 1.7–2.4)

## 2020-01-14 LAB — PHOSPHORUS: Phosphorus: 4 mg/dL (ref 2.5–4.6)

## 2020-01-14 MED ORDER — DULOXETINE HCL 20 MG PO CPEP
20.0000 mg | ORAL_CAPSULE | Freq: Every evening | ORAL | Status: DC
  Administered 2020-01-15: 20 mg via ORAL
  Filled 2020-01-14 (×2): qty 1

## 2020-01-14 MED ORDER — METOPROLOL SUCCINATE ER 25 MG PO TB24
25.0000 mg | ORAL_TABLET | Freq: Every day | ORAL | Status: DC
  Administered 2020-01-15 – 2020-01-16 (×2): 25 mg via ORAL
  Filled 2020-01-14: qty 1

## 2020-01-14 MED ORDER — LORAZEPAM 1 MG PO TABS
1.0000 mg | ORAL_TABLET | ORAL | Status: DC | PRN
  Filled 2020-01-14: qty 1

## 2020-01-14 MED ORDER — CLONIDINE HCL 0.1 MG PO TABS
0.2000 mg | ORAL_TABLET | Freq: Once | ORAL | Status: AC
  Administered 2020-01-14: 0.2 mg via ORAL
  Filled 2020-01-14: qty 2

## 2020-01-14 MED ORDER — LORAZEPAM 2 MG/ML IJ SOLN: 1.0000 mg | INTRAMUSCULAR | Status: DC | PRN

## 2020-01-14 MED ORDER — CLONIDINE HCL 0.1 MG PO TABS
0.2000 mg | ORAL_TABLET | Freq: Two times a day (BID) | ORAL | Status: DC
  Administered 2020-01-14 – 2020-01-16 (×3): 0.2 mg via ORAL
  Filled 2020-01-14 (×3): qty 2

## 2020-01-14 NOTE — Progress Notes (Signed)
   01/14/20 1405  Vitals  Temp 99.9 F (37.7 C)  Temp Source Oral  BP 129/69  MAP (mmHg) 87  BP Location Right Arm  BP Method Automatic  Patient Position (if appropriate) Lying  Pulse Rate (!) 118  Pulse Rate Source Monitor  Resp 16  Level of Consciousness  Level of Consciousness Alert  MEWS COLOR  MEWS Score Color Yellow  Oxygen Therapy  SpO2 93 %  O2 Device Room Air  MEWS Score  MEWS Temp 0  MEWS Systolic 0  MEWS Pulse 2  MEWS RR 0  MEWS LOC 0  MEWS Score 2  Provider Notification  Provider Name/Title Rudene Christians, MD/Lance PA  Date Provider Notified 01/14/20  Time Provider Notified 1440  Notification Type Page  Notification Reason Other (Comment) (continued with elevating HR)  Response See new orders  Date of Provider Response 01/14/20  Time of Provider Response 1445

## 2020-01-14 NOTE — Progress Notes (Signed)
   01/14/20 1117  Vitals  Temp (!) 101.1 F (38.4 C)  Temp Source Oral  BP 113/61  MAP (mmHg) 72  BP Location Right Arm  BP Method Automatic  Patient Position (if appropriate) Lying  Pulse Rate (!) 114  Pulse Rate Source Monitor  Resp 16  Level of Consciousness  Level of Consciousness Alert  Oxygen Therapy  SpO2 95 %  O2 Device Room Air  Provider Notification  Provider Name/Title Katha Hamming MD  Date Provider Notified 01/14/20  Time Provider Notified 1120  Notification Type Page  Notification Reason Other (Comment) (Elevating temp/Elevating HR)  Response See new orders  Date of Provider Response 01/14/20  Time of Provider Response 1158

## 2020-01-14 NOTE — Progress Notes (Signed)
   01/14/20 0124  Assess: MEWS Score  Temp (!) 100.8 F (38.2 C)  BP 131/67  Pulse Rate (!) 120  Resp 17  SpO2 92 %  O2 Device Room Air  Assess: MEWS Score  MEWS Temp 1  MEWS Systolic 0  MEWS Pulse 2  MEWS RR 0  MEWS LOC 0  MEWS Score 3  MEWS Score Color Yellow  Assess: if the MEWS score is Yellow or Red  Were vital signs taken at a resting state? Yes  Focused Assessment No change from prior assessment  Early Detection of Sepsis Score *See Row Information* High  MEWS guidelines implemented *See Row Information* Yes  Treat  MEWS Interventions Administered scheduled meds/treatments (Spoke with Dr. Rudene Christians)  Take Vital Signs  Increase Vital Sign Frequency  Yellow: Q 2hr X 2 then Q 4hr X 2, if remains yellow, continue Q 4hrs  Escalate  MEWS: Escalate Yellow: discuss with charge nurse/RN and consider discussing with provider and RRT  Notify: Provider  Provider Name/Title Dr. Rudene Christians  Date Provider Notified 01/14/20  Time Provider Notified [phone]  Notification Type Call  Notification Reason Change in status  Response See new orders  Date of Provider Response 01/14/20  Time of Provider Response 0137  Document  Patient Outcome Other (Comment) (Stabilizing after intervention)  Progress note created (see row info) Yes

## 2020-01-14 NOTE — Progress Notes (Signed)
Subjective: 3 Days Post-Op Procedure(s) (LRB): Right Total Knee Arthroplasty (Right) Patient reports pain as 9 on 0-10 scale.   Patient is well but did run fevers last night. Plan is to go Home after hospital stay. Patient is unable to go to rehab and insurance will no approve HHPT. Plan will be for discharge home and then begin outpatient PT. Patient is performing minimal moves with PT at this time, will not be able to discharge home along at this time. Negative for chest pain and shortness of breath Fever: no Gastrointestinal: Negative for nausea and vomiting Denies any issues with urination.  Objective: Vital signs in last 24 hours: Temp:  [98.3 F (36.8 C)-100.8 F (38.2 C)] 99.7 F (37.6 C) (08/08 0738) Pulse Rate:  [105-120] 105 (08/08 0738) Resp:  [16-18] 16 (08/08 0738) BP: (120-141)/(62-71) 141/67 (08/08 0738) SpO2:  [91 %-97 %] 95 % (08/08 0738)  Intake/Output from previous day:  Intake/Output Summary (Last 24 hours) at 01/14/2020 0827 Last data filed at 01/14/2020 2637 Gross per 24 hour  Intake --  Output 1200 ml  Net -1200 ml    Intake/Output this shift: No intake/output data recorded.  Labs: Recent Labs    01/12/20 0546  HGB 9.9*   Recent Labs    01/12/20 0546  WBC 12.2*  RBC 3.53*  HCT 29.0*  PLT 185   Recent Labs    01/12/20 0546  NA 135  K 4.0  CL 102  CO2 25  BUN 10  CREATININE 0.45  GLUCOSE 192*  CALCIUM 8.0*   Recent Labs    01/11/20 0859  INR 1.5*     EXAM General - Patient is Alert and Oriented Extremity - Neurovascular intact Sensation intact distally Dorsiflexion/Plantar flexion intact Compartment soft Dressing/Incision - clean, dry, with wound VAC in place.  Minimal bloody drainage from the knee.  Polar care applied to the right knee with towel underneath.  No skin changes to the right knee, no evidence for infection. Motor Function - intact, moving toes well on exam. Patient has undergone surgery to the right foot and  ankle in the past so she has very limited motion. Negative homans to the right leg.  Past Medical History:  Diagnosis Date  . Alcohol abuse   . Anxiety   . Back injury   . Cervical cancer ([place])   . Charcot-Marie-Tooth disease   . COPD (chronic obstructive pulmonary disease) ([place])   . Family history of adverse reaction to anesthesia    PONV  . GERD (gastroesophageal reflux disease)   . Hepatitis    liver fibrosis, Hep C negative on 09/3019  . Hypertension   . Hypokalemia   . IDA (iron deficiency anemia) 06/26/2019  . Iron deficiency anemia   . Leg injury   . Liver cirrhosis ([place])   . Pneumonia   . Sepsis ([place]) 07/10/2019  . Symptomatic anemia 06/26/2019  . Thrombocytopenia ([facility])    Assessment/Plan: 3 Days Post-Op Procedure(s) (LRB): Right Total Knee Arthroplasty (Right) Active Problems:   S/P TKR (total knee replacement) using cement, right  Estimated body mass index is 24.19 kg/m as calculated from the following:   Height as of this encounter: 5\' 2"  (1.575 m).   Weight as of this encounter: 60 kg. Advance diet Up with therapy D/C IV fluids Discharge home with home health   Patient with continued moderate pain to the right knee, currently on Oxycontin in addition to short acting oxycodone. Fevers last night, will obtain UA in addition to  CXR. Continue with PT today, continue to work on a BM. Patient will not be able to have HHPT or SNF, will need to remain in house until safe to discharge home on her own.  DVT Prophylaxis - Aspirin Weight-Bearing as tolerated to right leg  J. Cameron Proud, PA-C Orthopaedic Surgery 01/14/2020, 8:27 AM

## 2020-01-14 NOTE — Progress Notes (Signed)
PT Cancellation Note  Patient Details Name: Rhonda Martin MRN: [id number] DOB: 1976/05/21   Cancelled Treatment:    Reason Eval/Treat Not Completed: Pain limiting ability to participate   Pt up with nursing this am.  RN reports significant pain with movement today.  Testing this am resulted in continued pain and discomfort and inability to participate this am.  Will continue tomorrow.   [name] 01/14/2020, 12:24 PM

## 2020-01-15 ENCOUNTER — Encounter: Payer: Self-pay | Admitting: Family Medicine

## 2020-01-15 LAB — CBC
HCT: 26.4 % — ABNORMAL LOW (ref 36.0–46.0)
Hemoglobin: 8.5 g/dL — ABNORMAL LOW (ref 12.0–15.0)
MCH: 27.4 pg (ref 26.0–34.0)
MCHC: 32.2 g/dL (ref 30.0–36.0)
MCV: 85.2 fL (ref 80.0–100.0)
Platelets: 124 10*3/uL — ABNORMAL LOW (ref 150–400)
RBC: 3.1 MIL/uL — ABNORMAL LOW (ref 3.87–5.11)
RDW: 21.6 % — ABNORMAL HIGH (ref 11.5–15.5)
WBC: 8.2 10*3/uL (ref 4.0–10.5)
nRBC: 0 % (ref 0.0–0.2)

## 2020-01-15 MED ORDER — SODIUM CHLORIDE 0.9 % BOLUS PEDS
500.0000 mL | Freq: Once | INTRAVENOUS | Status: AC
  Administered 2020-01-15: 500 mL via INTRAVENOUS

## 2020-01-15 NOTE — Anesthesia Postprocedure Evaluation (Signed)
Anesthesia Post Note  Patient: Rhonda Martin  Procedure(s) Performed: Right Total Knee Arthroplasty (Right Knee)  Patient location during evaluation: PACU Anesthesia Type: General Level of consciousness: awake and alert and oriented Pain management: pain level controlled Vital Signs Assessment: post-procedure vital signs reviewed and stable Respiratory status: spontaneous breathing Cardiovascular status: blood pressure returned to baseline Anesthetic complications: no   No complications documented.   Last Vitals:  Vitals:   01/15/20 0658 01/15/20 0818  BP: 131/76 133/63  Pulse: (!) 108 (!) 108  Resp: 16 14  Temp: 37 C 37.2 C  SpO2: 93% 98%    Last Pain:  Vitals:   01/15/20 0818  TempSrc: Oral  PainSc:                  Kataleyah Carducci

## 2020-01-15 NOTE — Progress Notes (Addendum)
Physical Therapy Treatment Patient Details Name: Rhonda Martin MRN: [id number] DOB: July 28, 1975 Today's Date: 01/15/2020    History of Present Illness 44 y/o female s/p R TKA 01/12/20. History includes liver cirrhosis, alcohol abuse, HTN, COPD, Charcot-Marie Tooth disease, and chronic hep C.    PT Comments    Ready for session.  Minimal AA/PROM tolerated.  She stated she got out of bed last night on her own and walked without walker towards the door "I don't know how I did it."  Stating she has increased pain from activity.  Attempted mobility but she was only able to get 1/2 way to edge of bed before needing to lay back down due to pain.  Reviewed no mobility without staff assist.  Will continue this PM.   Follow Up Recommendations  Follow surgeons recommendation for DC plan and follow-up therapies;Supervision/Assistance - 24 hour;Other (comment) (pt unable to go to SNF or have HHPT)     Equipment Recommendations  None recommended by PT    Recommendations for Other Services       Precautions / Restrictions Precautions Precautions: Fall;Knee Precaution Booklet Issued: No Precaution Comments: per surgeon does not need to have heel elevated first few days post-op Restrictions Weight Bearing Restrictions: Yes RLE Weight Bearing: Weight bearing as tolerated    Mobility  Bed Mobility Overal bed mobility: Needs Assistance Bed Mobility: Supine to Sit;Sit to Supine     Supine to sit: Mod assist Sit to supine: Mod assist      Transfers                 General transfer comment: deferred due pain  Ambulation/Gait                 Stairs             Wheelchair Mobility    Modified Rankin (Stroke Patients Only)       Balance Overall balance assessment: Needs assistance Sitting-balance support: Bilateral upper extremity supported Sitting balance-Leahy Scale: Poor Sitting balance - Comments: BUE support due to pain limiting balance                                     Cognition Arousal/Alertness: Awake/alert Behavior During Therapy: Anxious;Restless Overall Cognitive Status: Within Functional Limits for tasks assessed                                        Exercises Total Joint Exercises Ankle Circles/Pumps: AROM;5 reps Quad Sets: AAROM;10 reps Heel Slides: PROM;10 reps Hip ABduction/ADduction: PROM;10 reps Straight Leg Raises: AAROM;10 reps Goniometric ROM: 4-15    General Comments        Pertinent Vitals/Pain Pain Assessment: Faces Faces Pain Scale: Hurts worst Pain Location: entire R LE Pain Descriptors / Indicators: Constant;Grimacing;Guarding;Sore;Sharp Pain Intervention(s): Limited activity within patient's tolerance;Monitored during session;Premedicated before session;Repositioned;Ice applied    Home Living                      Prior Function            PT Goals (current goals can now be found in the care plan section) Progress towards PT goals: Not progressing toward goals - comment    Frequency    BID      PT Plan Current plan remains appropriate  Co-evaluation              AM-PAC PT "6 Clicks" Mobility   Outcome Measure  Help needed turning from your back to your side while in a flat bed without using bedrails?: A Little Help needed moving from lying on your back to sitting on the side of a flat bed without using bedrails?: A Little Help needed moving to and from a bed to a chair (including a wheelchair)?: A Little Help needed standing up from a chair using your arms (e.g., wheelchair or bedside chair)?: A Little Help needed to walk in hospital room?: A Little Help needed climbing 3-5 steps with a railing? : A Little 6 Click Score: 18    End of Session Equipment Utilized During Treatment: Gait belt Activity Tolerance: Patient limited by pain Patient left: with bed alarm set;with call bell/phone within reach Nurse Communication: Mobility  status PT Visit Diagnosis: Muscle weakness (generalized) (M62.81);Difficulty in walking, not elsewhere classified (R26.2);Pain Pain - Right/Left: Right Pain - part of body: Knee     Time: 5883-2549 PT Time Calculation (min) (ACUTE ONLY): 11 min  Charges:  $Therapeutic Exercise: 8-22 mins                    [name], PTA 01/15/20, 10:33 AM

## 2020-01-15 NOTE — Progress Notes (Signed)
   Subjective: 4 Days Post-Op Procedure(s) (LRB): Right Total Knee Arthroplasty (Right) Patient reports pain as severe.   CIWA protocol was initiated yesterday.  Patient with history of alcohol abuse. Denies any CP, SOB, ABD pain. We will continue therapy today.  Poor progress with PT Insurance unable to cover home health and patient refuses to go to SNF  Objective: Vital signs in last 24 hours: Temp:  [98.6 F (37 C)-101.1 F (38.4 C)] 98.6 F (37 C) (08/09 0658) Pulse Rate:  [106-118] 108 (08/09 0658) Resp:  [16-20] 16 (08/09 0658) BP: (105-131)/(46-76) 131/76 (08/09 0658) SpO2:  [92 %-95 %] 93 % (08/09 0658)  Intake/Output from previous day: 08/08 0701 - 08/09 0700 In: -  Out: 450 [Urine:450] Intake/Output this shift: No intake/output data recorded.  Recent Labs    01/14/20 0801  HGB 8.8*   Recent Labs    01/14/20 0801  WBC 12.0*  RBC 3.25*  HCT 26.5*  PLT 154   Recent Labs    01/14/20 0801  NA 133*  K 4.4  CL 100  CO2 22  BUN 10  CREATININE 0.52  GLUCOSE 158*  CALCIUM 7.8*   No results for input(s): LABPT, INR in the last 72 hours.  EXAM General - Patient is Alert, Appropriate and Oriented Extremity - Neurovascular intact Sensation intact distally Intact pulses distally Dorsiflexion/Plantar flexion intact No cellulitis present Compartment soft Dressing - dressing C/D/I and no drainage, Praveena intact with scant drainage Motor Function - intact, moving foot and toes well on exam.   Past Medical History:  Diagnosis Date  . Alcohol abuse   . Anxiety   . Back injury   . Cervical cancer ([place])   . Charcot-Marie-Tooth disease   . COPD (chronic obstructive pulmonary disease) ([place])   . Family history of adverse reaction to anesthesia    PONV  . GERD (gastroesophageal reflux disease)   . Hepatitis    liver fibrosis, Hep C negative on 09/3019  . Hypertension   . Hypokalemia   . IDA (iron deficiency anemia) 06/26/2019  . Iron deficiency anemia    . Leg injury   . Liver cirrhosis ([place])   . Pneumonia   . Sepsis ([place]) 07/10/2019  . Symptomatic anemia 06/26/2019  . Thrombocytopenia ([facility])     Assessment/Plan:   4 Days Post-Op Procedure(s) (LRB): Right Total Knee Arthroplasty (Right) Active Problems:   S/P TKR (total knee replacement) using cement, right  Estimated body mass index is 24.19 kg/m as calculated from the following:   Height as of this encounter: 5\' 2"  (1.575 m).   Weight as of this encounter: 60 kg. Advance diet Up with therapy   Continue CIWA protocol, vital signs are stable  Fever of 101.1 yesterday, chest x-ray showing bronchiolitis versus early developing pneumonia.  Radiologist recommended early repeat chest x-ray.  Patient currently without cough, chest pain or shortness of breath.  Will perform repeat chest x-ray tomorrow per radiologist recommendation.  Encourage incentive spirometer.  Recheck labs in the morning  Patient refuses to go to SNF.  Patient currently not safe enough to go home.  DVT Prophylaxis - Aspirin, Foot Pumps and TED hose Weight-Bearing as tolerated to right leg   T. Rachelle Hora, PA-C [place] 01/15/2020, 8:10 AM

## 2020-01-15 NOTE — Progress Notes (Signed)
Ch visited with Pt as part of routine rounding. Ch introduced self, and Pt requested prayer. Pt was worried about her declining health. She kept mentioning her bleeding ulcer. Ch prayed with Pt. Pt was grateful for visit. Pt requested chaplain visits because she gets lonely.

## 2020-01-15 NOTE — Progress Notes (Signed)
Physical Therapy Treatment Patient Details Name: JAMISEN HAWES MRN: [id number] DOB: 07/05/1975 Today's Date: 01/15/2020    History of Present Illness 44 y/o female s/p R TKA 01/12/20. History includes liver cirrhosis, alcohol abuse, HTN, COPD, Charcot-Marie Tooth disease, and chronic hep C.    PT Comments    Pt agrees to session.  She is able to progress to EOB this session with assist for RLE in and out of bed.  She remains sitting EOB x 10 minutes generally tearful at times and recounting multiple accidents and injuries over the years.  She does attempt standing with walker and mod a x 1 but is unable to stand fully and falls back onto the bed.  She stated she is unable to use a walker and asks for crutches.  Education provided regarding safety of devices and encouraged that RW is safest for her at this time.  "I can't fall with crutches under my arms."  She again asks for them but it is discouraged.  She is unable to attempt further mobility or OOB to chair at this time and self initiates return to bed stating sitting was enough for today.  She voices being unable to tolerate the pain in general over the years and overall poor family support at home.     Follow Up Recommendations  Follow surgeon's recommendation for DC plan and follow-up therapies;Supervision/Assistance - 24 hour;Other (comment)     Equipment Recommendations  None recommended by PT    Recommendations for Other Services       Precautions / Restrictions Precautions Precautions: Fall;Knee Precaution Booklet Issued: No Precaution Comments: per surgeon does not need to have heel elevated first few days post-op Restrictions Weight Bearing Restrictions: Yes RLE Weight Bearing: Weight bearing as tolerated    Mobility  Bed Mobility Overal bed mobility: Needs Assistance Bed Mobility: Supine to Sit;Sit to Supine     Supine to sit: Min assist Sit to supine: Min assist   General bed mobility comments: assist for  RLE  Transfers Overall transfer level: Needs assistance Equipment used: Rolling walker (2 wheeled) Transfers: Sit to/from Stand Sit to Stand: Min assist;Mod assist         General transfer comment: does not stand fully due to pain  Ambulation/Gait                 Stairs             Wheelchair Mobility    Modified Rankin (Stroke Patients Only)       Balance Overall balance assessment: Needs assistance Sitting-balance support: Bilateral upper extremity supported Sitting balance-Leahy Scale: Fair Sitting balance - Comments: BUE support due to pain limiting balance   Standing balance support: Bilateral upper extremity supported Standing balance-Leahy Scale: Poor                              Cognition Arousal/Alertness: Awake/alert Behavior During Therapy: Anxious;Restless Overall Cognitive Status: Within Functional Limits for tasks assessed                                        Exercises      General Comments        Pertinent Vitals/Pain Pain Assessment: Faces Faces Pain Scale: Hurts whole lot Pain Location: entire R LE Pain Descriptors / Indicators: Constant;Grimacing;Guarding;Sore;Sharp Pain Intervention(s): Limited activity within patient's tolerance;Monitored  during session;Repositioned;Ice applied    Home Living                      Prior Function            PT Goals (current goals can now be found in the care plan section) Progress towards PT goals: Not progressing toward goals - comment    Frequency           PT Plan Current plan remains appropriate    Co-evaluation              AM-PAC PT "6 Clicks" Mobility   Outcome Measure    Help needed moving from lying on your back to sitting on the side of a flat bed without using bedrails?: A Little Help needed moving to and from a bed to a chair (including a wheelchair)?: A Lot Help needed standing up from a chair using your arms  (e.g., wheelchair or bedside chair)?: A Lot Help needed to walk in hospital room?: Total Help needed climbing 3-5 steps with a railing? : Total 6 Click Score: 9    End of Session Equipment Utilized During Treatment: Gait belt Activity Tolerance: Patient limited by pain Patient left: with bed alarm set;with call bell/phone within reach Nurse Communication: Mobility status Pain - Right/Left: Right Pain - part of body: Knee     Time: 9024-0973 PT Time Calculation (min) (ACUTE ONLY): 17 min  Charges:  $Therapeutic Activity: 8-22 mins                    [name], PTA 01/15/20, 3:18 PM

## 2020-01-16 ENCOUNTER — Inpatient Hospital Stay: Payer: Medicaid Other

## 2020-01-16 MED ORDER — AZITHROMYCIN 500 MG PO TABS
500.0000 mg | ORAL_TABLET | Freq: Once | ORAL | Status: AC
  Administered 2020-01-16: 500 mg via ORAL
  Filled 2020-01-16: qty 1

## 2020-01-16 MED ORDER — NITROFURANTOIN MONOHYD MACRO 100 MG PO CAPS: 100.0000 mg | ORAL_CAPSULE | Freq: Two times a day (BID) | ORAL | 0 refills | Status: AC

## 2020-01-16 MED ORDER — TRAMADOL HCL 50 MG PO TABS: 50.0000 mg | ORAL_TABLET | Freq: Four times a day (QID) | ORAL | 0 refills | Status: DC

## 2020-01-16 MED ORDER — OXYCODONE HCL 5 MG PO TABS: 5.0000 mg | ORAL_TABLET | ORAL | 0 refills | Status: DC | PRN

## 2020-01-16 MED ORDER — OXYCODONE HCL ER 15 MG PO T12A: 15.0000 mg | EXTENDED_RELEASE_TABLET | Freq: Two times a day (BID) | ORAL | 0 refills | Status: DC

## 2020-01-16 MED ORDER — ASPIRIN 81 MG PO CHEW: 81.0000 mg | CHEWABLE_TABLET | Freq: Two times a day (BID) | ORAL | 0 refills | Status: DC

## 2020-01-16 MED ORDER — NITROFURANTOIN MONOHYD MACRO 100 MG PO CAPS
100.0000 mg | ORAL_CAPSULE | Freq: Two times a day (BID) | ORAL | Status: DC
  Administered 2020-01-16: 100 mg via ORAL
  Filled 2020-01-16 (×2): qty 1

## 2020-01-16 NOTE — Progress Notes (Signed)
Physical Therapy Treatment Patient Details Name: Rhonda Martin MRN: [id number] DOB: [birth year] Today's Date: 01/16/2020    History of Present Illness 44 y/o female s/p R TKA 01/12/20. History includes liver cirrhosis, alcohol abuse, HTN, COPD, Charcot-Marie Tooth disease, and chronic hep C.    PT Comments    Pt still with plenty of pain but managed to participate with much less severity of pain and associated hesitancy.  She is still not eager to do a lot of work with PT but did show good effort with what she was able to do.  She refused to walk into the hallway, but did manage to negotiate up/down 4 steps with near simulation of home situation (though she did use impulsive, atypical strategy).  Pt still very limited, hesitant to put much weight through R LE and has less than expected ROM at this point post-op but regardless this was her best session yet.  Pt eager to go home and will likely discharge this afternoon.  Needs continued PT with HH at discharge.  Follow Up Recommendations  Follow surgeon's recommendation for DC plan and follow-up therapies;Supervision/Assistance - 24 hour     Equipment Recommendations  None recommended by PT    Recommendations for Other Services       Precautions / Restrictions Precautions Precautions: Fall;Knee Restrictions RLE Weight Bearing: Weight bearing as tolerated    Mobility  Bed Mobility Overal bed mobility: Needs Assistance Bed Mobility: Supine to Sit     Supine to sit: Min guard Sit to supine: Min guard   General bed mobility comments: Pt continues to be impulsive but did manage transitions w/o phyiscal assist  Transfers Overall transfer level: Needs assistance Equipment used: Rolling walker (2 wheeled) Transfers: Sit to/from Stand Sit to Stand: Min guard         General transfer comment: Pt continues to be impulsive but did manage transitions w/o phyiscal assist  Ambulation/Gait Ambulation/Gait assistance: Min guard Gait  Distance (Feet): 17 Feet Assistive device: Rolling walker (2 wheeled)       General Gait Details: continues to be very hesitant to do much WBing on R even with heavy cuing, similarly she was hesitant to do any ambulation in the hallway and even with cuing refused to go farther.   Stairs Stairs: Yes Stairs assistance: Min guard Stair Management: One rail Left Number of Stairs: 4 General stair comments: Pt was able to negotiate up/down steps with (near) single rail and no real physical assist.  Despite cuing she impulsively ascended steps with an unorthodox strategy but was able to do 4 steps up and down showing ability to manage in the home   Wheelchair Mobility    Modified Rankin (Stroke Patients Only)       Balance Overall balance assessment: Needs assistance Sitting-balance support: Bilateral upper extremity supported Sitting balance-Leahy Scale: Fair     Standing balance support: Bilateral upper extremity supported Standing balance-Leahy Scale: Fair Standing balance comment: reliant on walker/UEs, minimal weight through R                            Cognition Arousal/Alertness: Awake/alert Behavior During Therapy: Anxious;Restless Overall Cognitive Status: Within Functional Limits for tasks assessed                                        Exercises Total Joint Exercises Ankle Circles/Pumps:  AROM;10 reps Quad Sets: Strengthening;10 reps Short Arc Quad: AROM;AAROM;10 reps Heel Slides: PROM;5 reps (with resisted leg extensions) Hip ABduction/ADduction: AAROM;10 reps Straight Leg Raises: AROM;10 reps Knee Flexion: PROM Goniometric ROM: 4-75    General Comments        Pertinent Vitals/Pain Pain Assessment: 0-10 Pain Score: 7     Home Living                      Prior Function            PT Goals (current goals can now be found in the care plan section) Progress towards PT goals: Progressing toward goals     Frequency    BID      PT Plan Current plan remains appropriate    Co-evaluation              AM-PAC PT "6 Clicks" Mobility   Outcome Measure  Help needed turning from your back to your side while in a flat bed without using bedrails?: None Help needed moving from lying on your back to sitting on the side of a flat bed without using bedrails?: A Little Help needed moving to and from a bed to a chair (including a wheelchair)?: A Little Help needed standing up from a chair using your arms (e.g., wheelchair or bedside chair)?: A Little Help needed to walk in hospital room?: A Little Help needed climbing 3-5 steps with a railing? : A Lot 6 Click Score: 18    End of Session Equipment Utilized During Treatment: Gait belt Activity Tolerance: Patient limited by pain Patient left: with call bell/phone within reach;with chair alarm set Nurse Communication: Mobility status PT Visit Diagnosis: Muscle weakness (generalized) (M62.81);Difficulty in walking, not elsewhere classified (R26.2);Pain Pain - Right/Left: Right Pain - part of body: Knee     Time: 1103-1130 PT Time Calculation (min) (ACUTE ONLY): 27 min  Charges:  $Gait Training: 8-22 mins $Therapeutic Exercise: 8-22 mins                     Kreg Shropshire, DPT 01/16/2020, 1:26 PM

## 2020-01-16 NOTE — Progress Notes (Signed)
Patient refused full dose of Lactulose this am. 30 ml consumed.

## 2020-01-16 NOTE — TOC Transition Note (Signed)
Transition of Care [facility]) - CM/SW Discharge Note   Patient Details  Name: Rhonda Martin MRN: [id number] Date of Birth: 1975-10-09  Transition of Care  [facility] CM/SW Contact:  Shelbie Ammons, RN Phone Number: 01/16/2020, 9:13 AM   Clinical Narrative:   RNCM met with patient at bedside, per Helene Kelp with [facility] they can accept patient. Patient is agreeable to this plan and will likely d/c today, no equipment needed.     Final next level of care: [place]     Patient Goals and CMS Choice Patient states their goals for this hospitalization and ongoing recovery are:: get better      Discharge Placement                       Discharge Plan and Services                                     Social Determinants of Health (SDOH) Interventions     Readmission Risk Interventions Readmission Risk Prevention Plan 11/29/2019 10/25/2019 01/21/2019  Post Dischage Appt - - Complete  Medication Screening - - Complete  Transportation Screening Complete Complete Complete  PCP or Specialist Appt within 3-5 Days - Complete -  HRI or Home Care Consult - Complete -  Medication Review ([place]) Complete Complete -  PCP or Specialist appointment within 3-5 days of discharge Complete - -  Palliative Care Screening Not Applicable - -  [place] Not Applicable - -

## 2020-01-16 NOTE — Progress Notes (Signed)
   Subjective: 5 Days Post-Op Procedure(s) (LRB): Right Total Knee Arthroplasty (Right) Patient reports pain as moderate.  Pain improving. CIWA protocol was initiated postop day 4.  Patient with history of alcohol abuse.  Currently with no signs of withdrawals Denies any CP, SOB, ABD pain. We will continue therapy today.  Poor progress with PT Insurance unable to cover home health and patient refuses to go to SNF  Objective: Vital signs in last 24 hours: Temp:  [98.7 F (37.1 C)-102.1 F (38.9 C)] 99.3 F (37.4 C) (08/10 0750) Pulse Rate:  [107-130] 107 (08/10 0750) Resp:  [15-20] 17 (08/10 0750) BP: (107-150)/(40-70) 120/70 (08/10 0750) SpO2:  [93 %-98 %] 95 % (08/10 0750)  Intake/Output from previous day: 08/09 0701 - 08/10 0700 In: 480 [P.O.:480] Out: 650 [Urine:650] Intake/Output this shift: No intake/output data recorded.  Recent Labs    01/14/20 0801 01/15/20 0821  HGB 8.8* 8.5*   Recent Labs    01/14/20 0801 01/15/20 0821  WBC 12.0* 8.2  RBC 3.25* 3.10*  HCT 26.5* 26.4*  PLT 154 124*   Recent Labs    01/14/20 0801  NA 133*  K 4.4  CL 100  CO2 22  BUN 10  CREATININE 0.52  GLUCOSE 158*  CALCIUM 7.8*   No results for input(s): LABPT, INR in the last 72 hours.  EXAM General - Patient is Alert, Appropriate and Oriented Extremity - Neurovascular intact Sensation intact distally Intact pulses distally Dorsiflexion/Plantar flexion intact No cellulitis present Compartment soft Dressing - dressing C/D/I and no drainage, Praveena intact with scant drainage Motor Function - intact, moving foot and toes well on exam.   Past Medical History:  Diagnosis Date  . Alcohol abuse   . Anxiety   . Back injury   . Cervical cancer ([place])   . Charcot-Marie-Tooth disease   . COPD (chronic obstructive pulmonary disease) ([place])   . Family history of adverse reaction to anesthesia    PONV  . GERD (gastroesophageal reflux disease)   . Hepatitis    liver  fibrosis, Hep C negative on 09/3019  . Hypertension   . Hypokalemia   . IDA (iron deficiency anemia) 06/26/2019  . Iron deficiency anemia   . Leg injury   . Liver cirrhosis ([place])   . Pneumonia   . Sepsis ([place]) 07/10/2019  . Symptomatic anemia 06/26/2019  . Thrombocytopenia ([facility])     Assessment/Plan:   5 Days Post-Op Procedure(s) (LRB): Right Total Knee Arthroplasty (Right) Active Problems:   S/P TKR (total knee replacement) using cement, right  Estimated body mass index is 24.19 kg/m as calculated from the following:   Height as of this encounter: 5\' 2"  (1.575 m).   Weight as of this encounter: 60 kg. Advance diet Up with therapy   Continue CIWA protocol, vital signs are stable.  No signs of withdrawal  We will repeat chest x-ray today per radiologist recommendations.  Patient doing well, afebrile.  Vital signs are stable.  Encourage incentive spirometer  UTI -urinalysis yesterday showing UTI.  Will start Macrobid  Acute postop blood loss anemia -hemoglobin 8.5.  Continue with iron supplement  Patient refuses to go to SNF.  Will discharge home today with home health PT.  DVT Prophylaxis - Aspirin, Foot Pumps and TED hose Weight-Bearing as tolerated to right leg   T. Rachelle Hora, PA-C [place] 01/16/2020, 8:59 AM

## 2020-01-16 NOTE — Discharge Instructions (Signed)
TOTAL KNEE REPLACEMENT POSTOPERATIVE DIRECTIONS  Knee Rehabilitation, Guidelines Following Surgery  Results after knee surgery are often greatly improved when you follow the exercise, range of motion and muscle strengthening exercises prescribed by your doctor. Safety measures are also important to protect the knee from further injury. Any time any of these exercises cause you to have increased pain or swelling in your knee joint, decrease the amount until you are comfortable again and slowly increase them. If you have problems or questions, call your caregiver or physical therapist for advice.   HOME CARE INSTRUCTIONS  Remove items at home which could result in a fall. This includes throw rugs or furniture in walking pathways.   ICE using the Polar Care unit to the affected knee every three hours for 30 minutes at a time and then as needed for pain and swelling.  Place a dry towel or pillow case over the knee before applying the Polar Care Unit.  Continue to use ice on the knee for pain and swelling from surgery. You may notice swelling that will progress down to the foot and ankle.  This is normal after surgery.  Elevate the leg when you are not up walking on it.    Continue to use the breathing machine which will help keep your temperature down.  It is common for your temperature to cycle up and down following surgery, especially at night when you are not up moving around and exerting yourself.  The breathing machine keeps your lungs expanded and your temperature down.  Do not place pillow under knee, focus on keeping the knee straight while resting  DIET You may resume your previous home diet once your are discharged from the hospital.  DRESSING / WOUND CARE / SHOWERING Please remove provena negative pressure dressing on 01/23/2020 and apply honey comb dressing. Keep dressing clean and dry at all times.  ACTIVITY Walk with your walker as instructed. Use walker as long as suggested by your  caregivers. Avoid periods of inactivity such as sitting longer than an hour when not asleep. This helps prevent blood clots.  You may resume a sexual relationship in one month or when given the OK by your doctor.  You may return to work once you are cleared by your doctor.  Do not drive a car for 6 weeks or until released by you surgeon.  Do not drive while taking narcotics.  WEIGHT BEARING Weight bearing as tolerated with assist device (walker, cane, etc) as directed, use it as long as suggested by your surgeon or therapist, typically at least 4-6 weeks.  POSTOPERATIVE CONSTIPATION PROTOCOL Constipation - defined medically as fewer than three stools per week and severe constipation as less than one stool per week.  One of the most common issues patients have following surgery is constipation.  Even if you have a regular bowel pattern at home, your normal regimen is likely to be disrupted due to multiple reasons following surgery.  Combination of anesthesia, postoperative narcotics, change in appetite and fluid intake all can affect your bowels.  In order to avoid complications following surgery, here are some recommendations in order to help you during your recovery period.  Colace (docusate) - Pick up an over-the-counter form of Colace or another stool softener and take twice a day as long as you are requiring postoperative pain medications.  Take with a full glass of water daily.  If you experience loose stools or diarrhea, hold the colace until you stool forms back up.  If your symptoms do not get better within 1 week or if they get worse, check with your doctor.  Dulcolax (bisacodyl) - Pick up over-the-counter and take as directed by the product packaging as needed to assist with the movement of your bowels.  Take with a full glass of water.  Use this product as needed if not relieved by Colace only.   MiraLax (polyethylene glycol) - Pick up over-the-counter to have on hand.  MiraLax is a  solution that will increase the amount of water in your bowels to assist with bowel movements.  Take as directed and can mix with a glass of water, juice, soda, coffee, or tea.  Take if you go more than two days without a movement. Do not use MiraLax more than once per day. Call your doctor if you are still constipated or irregular after using this medication for 7 days in a row.  If you continue to have problems with postoperative constipation, please contact the office for further assistance and recommendations.  If you experience "the worst abdominal pain ever" or develop nausea or vomiting, please contact the office immediatly for further recommendations for treatment.  ITCHING  If you experience itching with your medications, try taking only a single pain pill, or even half a pain pill at a time.  You can also use Benadryl over the counter for itching or also to help with sleep.   TED HOSE STOCKINGS Wear the elastic stockings on both legs for six weeks following surgery during the day but you may remove then at night for sleeping.  MEDICATIONS See your medication summary on the "After Visit Summary" that the nursing staff will review with you prior to discharge.  You may have some home medications which will be placed on hold until you complete the course of blood thinner medication.  It is important for you to complete the blood thinner medication as prescribed by your surgeon.  Continue your approved medications as instructed at time of discharge.  PRECAUTIONS If you experience chest pain or shortness of breath - call 911 immediately for transfer to the hospital emergency department.  If you develop a fever greater that 101 F, purulent drainage from wound, increased redness or drainage from wound, foul odor from the wound/dressing, or calf pain - CONTACT YOUR SURGEON.                                                   FOLLOW-UP APPOINTMENTS Make sure you keep all of your appointments after  your operation with your surgeon and caregivers. You should call the office at the above phone number and make an appointment for approximately two weeks after the date of your surgery or on the date instructed by your surgeon outlined in the "After Visit Summary".   RANGE OF MOTION AND STRENGTHENING EXERCISES  Rehabilitation of the knee is important following a knee injury or an operation. After just a few days of immobilization, the muscles of the thigh which control the knee become weakened and shrink (atrophy). Knee exercises are designed to build up the tone and strength of the thigh muscles and to improve knee motion. Often times heat used for twenty to thirty minutes before working out will loosen up your tissues and help with improving the range of motion but do not use heat for the first  two weeks following surgery. These exercises can be done on a training (exercise) mat, on the floor, on a table or on a bed. Use what ever works the best and is most comfortable for you Knee exercises include:  Leg Lifts - While your knee is still immobilized in a splint or cast, you can do straight leg raises. Lift the leg to 60 degrees, hold for 3 sec, and slowly lower the leg. Repeat 10-20 times 2-3 times daily. Perform this exercise against resistance later as your knee gets better.  Quad and Hamstring Sets - Tighten up the muscle on the front of the thigh (Quad) and hold for 5-10 sec. Repeat this 10-20 times hourly. Hamstring sets are done by pushing the foot backward against an object and holding for 5-10 sec. Repeat as with quad sets.   Leg Slides: Lying on your back, slowly slide your foot toward your buttocks, bending your knee up off the floor (only go as far as is comfortable). Then slowly slide your foot back down until your leg is flat on the floor again.  Angel Wings: Lying on your back spread your legs to the side as far apart as you can without causing discomfort.  A rehabilitation program  following serious knee injuries can speed recovery and prevent re-injury in the future due to weakened muscles. Contact your doctor or a physical therapist for more information on knee rehabilitation.   IF YOU ARE TRANSFERRED TO A SKILLED REHAB FACILITY If the patient is transferred to a skilled rehab facility following release from the hospital, a list of the current medications will be sent to the facility for the patient to continue.  When discharged from the skilled rehab facility, please have the facility set up the patient's [place] prior to being released. Also, the skilled facility will be responsible for providing the patient with their medications at time of release from the facility to include their pain medication, the muscle relaxants, and their blood thinner medication. If the patient is still at the rehab facility at time of the two week follow up appointment, the skilled rehab facility will also need to assist the patient in arranging follow up appointment in our office and any transportation needs.  MAKE SURE YOU:  Understand these instructions.  Get help right away if you are not doing well or get worse.    Pick up stool softner and laxative for home use following surgery while on pain medications. Do not submerge incision under water. Please use good hand washing techniques while changing dressing each day. May shower starting three days after surgery. Please use a clean towel to pat the incision dry following showers. Continue to use ice for pain and swelling after surgery. Do not use any lotions or creams on the incision until instructed by your surgeon.

## 2020-01-16 NOTE — Progress Notes (Signed)
Notified MD of VS on night shift, however in a surgical procedure, awaiting response post procedure.

## 2020-01-16 NOTE — Progress Notes (Signed)
Discharge reviewed with understanding. Rxs and belongings returned from security. All belongings packed.

## 2020-01-16 NOTE — Discharge Summary (Signed)
Physician Discharge Summary  [name] MRN: [id number] DOB/AGE: 44-Aug-1977 44 y.o.  Admit date: 01/11/2020 Discharge date: 01/16/2020  Admission Diagnoses:  S/P TKR (total knee replacement) using cement, right [Z96.651]   Discharge Diagnoses: Patient Active Problem List   Diagnosis Date Noted  . S/P TKR (total knee replacement) using cement, 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  . Pancytopenia ([place])   . Hypomagnesemia   . GI bleeding 11/24/2019  . AKI (acute kidney injury) ([place])   . GIB (gastrointestinal bleeding)   . Elevated LFTs 10/24/2019  . Hyperbilirubinemia 10/24/2019  . Hematemesis 10/23/2019  . Acute esophagitis   . 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 Neutropenic fever ([place])   . Lobar pneumonia ([place])   . Hepatic encephalopathy ([place]) 08/23/2019  . Hypotension   . Hallucinations 08/22/2019  . Acute respiratory failure with hypoxia ([place]) 08/22/2019  . Acute metabolic encephalopathy 40/98/1191  . Acute hepatic encephalopathy 08/22/2019  . Sepsis ([place]) 07/10/2019  . CAP (community acquired pneumonia) 07/10/2019  . Hyponatremia 07/10/2019  . Hypokalemia 07/10/2019  . Anxiety 07/10/2019  . COPD (chronic obstructive pulmonary disease) ([place]) 07/10/2019  . HTN (hypertension) 07/10/2019  . Liver cirrhosis ([place])   . Symptomatic anemia 06/26/2019  . IDA (iron deficiency anemia) 06/26/2019  . Insomnia 04/03/2019  . Tachycardia 04/03/2019  . Chronic pain of right knee 03/31/2019  . Bilateral leg edema 02/06/2019  . Generalized anxiety disorder 01/23/2019  . 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 Alcohol withdrawal delirium ([place]) 01/20/2019  . Chronic hepatitis C without hepatic coma ([place]) 12/20/2018  . Thrombocytopenia ([place]) 12/20/2018  . Alcohol abuse 12/20/2018  . Transaminitis 12/20/2018  . Tobacco abuse 12/20/2018  . Easy bruising 12/20/2018  . Weakness 12/20/2018  . Folate deficiency 12/20/2018  . Hepatitis B core antibody negative 12/20/2018  .  Charcot-Marie-Tooth disease     Past Medical History:  Diagnosis Date  . Alcohol abuse   . Anxiety   . Back injury   . Cervical cancer ([place])   . Charcot-Marie-Tooth disease   . COPD (chronic obstructive pulmonary disease) ([place])   . Family history of adverse reaction to anesthesia    PONV  . GERD (gastroesophageal reflux disease)   . Hepatitis    liver fibrosis, Hep C negative on 09/3019  . Hypertension   . Hypokalemia   . IDA (iron deficiency anemia) 06/26/2019  . Iron deficiency anemia   . Leg injury   . Liver cirrhosis ([place])   . Pneumonia   . Sepsis ([place]) 07/10/2019  . Symptomatic anemia 06/26/2019  . Thrombocytopenia ([place])      Transfusion: none   Consultants (if any):   Discharged Condition: Improved  Hospital Course: Rhonda Martin is an 44 y.o. female who was admitted 01/11/2020 with a diagnosis of <principal problem not specified> and went to the operating room on 01/11/2020 and underwent the above named procedures.    Surgeries: Procedure(s): Right Total Knee Arthroplasty on 01/11/2020 Patient tolerated the surgery well. Taken to PACU where she was stabilized and then transferred to the orthopedic floor.  Started on aspirin 81 mg twice daily, TED hose, foot pumps.Marland Kitchen Heels elevated on bed with rolled towels. No evidence of DVT. Negative [place]. Physical therapy started on day #1 for gait training and transfer. OT started day #1 for ADL and assisted devices.  Patient's foley was d/c on day #1. Patient's IV was d/c on day #2.  Patient made poor progress with physical therapy.  Began showing signs of alcohol withdrawal  on postop day 3 and 4.  CIWA protocols were started and symptoms subsided.  Patient was having some pain in her right calf, ultrasound was negative for DVT.  She spiked a fever on postop day 4 of 101, urinalysis shows evidence of UTI.  Chest x-ray showed bronchiolitis with possible early developing pneumonia, repeat chest x-ray obtained on postop day 5 showed no  improvement, patient started on oral antibiotic.  Patient's labs and vital signs are stable on postop day 5.  Despite poor progress with physical therapy was insisting on going home and not going to skilled nursing facility.  Home health PT was set up for patient.  Patient felt safe and comfortable at home.  Implants: Medacta GMK PS system with threefemur, size 2 righttibia with short stem and 17 PSmm insert. Size 2patella, all components cemented.  She was given perioperative antibiotics:  Anti-infectives (From admission, onward)   Start     Dose/Rate Route Frequency Ordered Stop   01/16/20 1000  nitrofurantoin (macrocrystal-monohydrate) (MACROBID) capsule 100 mg     Discontinue     100 mg Oral Every 12 hours 01/16/20 0852 01/21/20 0959   01/16/20 0000  nitrofurantoin, macrocrystal-monohydrate, (MACROBID) 100 MG capsule     Discontinue     100 mg Oral Every 12 hours 01/16/20 0914 01/21/20 2359   01/11/20 1630  ceFAZolin (ANCEF) IVPB 1 g/50 mL premix        1 g 100 mL/hr over 30 Minutes Intravenous Every 6 hours 01/11/20 1614 01/11/20 2317   01/11/20 0811  ceFAZolin (ANCEF) 2-4 GM/100ML-% IVPB       Note to Pharmacy: Myles Lipps   : cabinet override      01/11/20 0811 01/11/20 2014   01/11/20 0800  ceFAZolin (ANCEF) IVPB 1 g/50 mL premix        1 g 100 mL/hr over 30 Minutes Intravenous On call to O.R. 01/11/20 0932 01/11/20 1038    .  She was given sequential compression devices, early ambulation, and aspirin, teds for DVT prophylaxis.  She benefited maximally from the hospital stay and there were no complications.    Recent vital signs:  Vitals:   01/16/20 0448 01/16/20 0750  BP: (!) 107/40 120/70  Pulse: (!) 108 (!) 107  Resp: 16 17  Temp: 98.9 F (37.2 C) 99.3 F (37.4 C)  SpO2: 93% 95%    Recent laboratory studies:  Lab Results  Component Value Date   HGB 8.5 (L) 01/15/2020   HGB 8.8 (L) 01/14/2020   HGB 9.9 (L) 01/12/2020   Lab Results  Component Value  Date   WBC 8.2 01/15/2020   PLT 124 (L) 01/15/2020   Lab Results  Component Value Date   INR 1.5 (H) 01/11/2020   Lab Results  Component Value Date   NA 133 (L) 01/14/2020   K 4.4 01/14/2020   CL 100 01/14/2020   CO2 22 01/14/2020   BUN 10 01/14/2020   CREATININE 0.52 01/14/2020   GLUCOSE 158 (H) 01/14/2020    Discharge Medications:   Allergies as of 01/16/2020      Reactions   Tylenol [acetaminophen] Other (See Comments)   Liver disease      Medication List    TAKE these medications   aspirin 81 MG chewable tablet Chew 1 tablet (81 mg total) by mouth 2 (two) times daily.   Blood Pressure Monitor Misc For automatic blood pressure cuff.   dicyclomine 20 MG tablet Commonly known as: BENTYL Take 1 tablet (20  mg total) by mouth 4 (four) times daily -  before meals and at bedtime.   DULoxetine 20 MG capsule Commonly known as: CYMBALTA Take 1 capsule (20 mg total) by mouth daily.   ferrous sulfate 325 (65 FE) MG EC tablet Take 1 tablet (325 mg total) by mouth daily with breakfast.   folic acid 1 MG tablet Commonly known as: FOLVITE Take 1 tablet (1 mg total) by mouth daily.   furosemide 20 MG tablet Commonly known as: LASIX Take 20 mg by mouth daily.   gabapentin 300 MG capsule Commonly known as: NEURONTIN Take 1 capsule (300 mg total) by mouth 3 (three) times daily as needed (pain.).   hydrOXYzine 25 MG tablet Commonly known as: ATARAX/VISTARIL Take 1 tablet (25 mg total) by mouth 3 (three) times daily as needed (anxiety.).   lactulose 10 GM/15ML solution Commonly known as: CHRONULAC Take 45 mLs (30 g total) by mouth 2 (two) times daily.   magnesium oxide 400 (241.3 Mg) MG tablet Commonly known as: MAG-OX Take 1 tablet (400 mg total) by mouth daily.   metoprolol succinate 25 MG 24 hr tablet Commonly known as: TOPROL-XL Take 0.5 tablets (12.5 mg total) by mouth daily.   midodrine 5 MG tablet Commonly known as: PROAMATINE Take 1 tablet (5 mg total)  by mouth in the morning and at bedtime. Pt is taking as needed bp is good   multivitamin with minerals Tabs tablet Take 1 tablet by mouth daily.   nitrofurantoin (macrocrystal-monohydrate) 100 MG capsule Commonly known as: MACROBID Take 1 capsule (100 mg total) by mouth every 12 (twelve) hours for 5 days.   OLANZapine 10 MG tablet Commonly known as: ZYPREXA Take 1 tablet (10 mg total) by mouth at bedtime.   ondansetron 4 MG tablet Commonly known as: ZOFRAN Take 1 tablet (4 mg total) by mouth every 6 (six) hours as needed for nausea.   oxyCODONE 5 MG immediate release tablet Commonly known as: Oxy IR/ROXICODONE Take 1 tablet (5 mg total) by mouth every 4 (four) hours as needed for moderate pain or severe pain. What changed: when to take this   oxyCODONE 15 mg 12 hr tablet Commonly known as: OxyCONTIN Take 1 tablet (15 mg total) by mouth every 12 (twelve) hours. What changed: You were already taking a medication with the same name, and this prescription was added. Make sure you understand how and when to take each.   pantoprazole 40 MG tablet Commonly known as: PROTONIX Take 1 tablet (40 mg total) by mouth daily.   potassium chloride SA 20 MEQ tablet Commonly known as: KLOR-CON Take 1 tablet (20 mEq total) by mouth daily.   spironolactone 25 MG tablet Commonly known as: ALDACTONE Take 25 mg by mouth daily.   sucralfate 1 g tablet Commonly known as: Carafate Take 1 tablet (1 g total) by mouth 4 (four) times daily -  with meals and at bedtime.   thiamine 100 MG tablet Take 1 tablet (100 mg total) by mouth daily.   traMADol 50 MG tablet Commonly known as: ULTRAM Take 1 tablet (50 mg total) by mouth every 6 (six) hours.   triamcinolone cream 0.1 % Commonly known as: KENALOG Apply 1 application topically 2 (two) times daily as needed (skin irritation).   vitamin B-12 500 MCG tablet Commonly known as: CYANOCOBALAMIN Take 1 tablet (500 mcg total) by mouth daily.    zolpidem 5 MG tablet Commonly known as: AMBIEN Take 1 tablet (5 mg total) by mouth at bedtime  as needed for sleep.            Durable Medical Equipment  (From admission, onward)         Start     Ordered   01/11/20 1615  DME Walker rolling  Once       Question Answer Comment  Walker: With 5 Inch Wheels   Patient needs a walker to treat with the following condition S/P TKR (total knee replacement) using cement, right      01/11/20 1614   01/11/20 1615  DME 3 n 1  Once        01/11/20 1614   01/11/20 1615  DME Bedside commode  Once       Question:  Patient needs a bedside commode to treat with the following condition  Answer:  S/P TKR (total knee replacement) using cement, right   01/11/20 1614          Diagnostic Studies: DG Chest 2 View  Result Date: 01/14/2020 CLINICAL DATA:  Smoker, recent knee surgery, fever EXAM: CHEST - 2 VIEW COMPARISON:  11/30/2019 chest radiograph. FINDINGS: Stable cardiomediastinal silhouette with normal heart size. No pneumothorax. No pleural effusion. Faint hazy micronodular opacities in the lower lungs. IMPRESSION: Faint hazy micronodular opacities in the lower lungs, suggesting bronchiolitis/developing bronchopneumonia. Short-term chest radiograph follow-up advised. Electronically Signed   By: Ilona Sorrel M.D.   On: 01/14/2020 11:19   DG Knee 1-2 Views Right  Result Date: 01/11/2020 CLINICAL DATA:  Total right knee replacement. EXAM: RIGHT KNEE - 1-2 VIEW COMPARISON:  11/23/2019. FINDINGS: Total right knee replacement.  Hardware intact.  Anatomic alignment. IMPRESSION: Total right knee replacement with anatomic alignment. Electronically Signed   By: Marcello Moores  Register   On: 01/11/2020 13:47   US Venous Img Lower Unilateral Right (DVT)  Result Date: 01/14/2020 CLINICAL DATA:  44 year old with right leg swelling. EXAM: RIGHT LOWER EXTREMITY VENOUS DOPPLER ULTRASOUND TECHNIQUE: Gray-scale sonography with graded compression, as well as color Doppler  and duplex ultrasound were performed to evaluate the lower extremity deep venous systems from the level of the common femoral vein and including the common femoral, femoral, profunda femoral, popliteal and calf veins including the posterior tibial, peroneal and gastrocnemius veins when visible. The superficial great saphenous vein was also interrogated. Spectral Doppler was utilized to evaluate flow at rest and with distal augmentation maneuvers in the common femoral, femoral and popliteal veins. COMPARISON:  12/10/2019 FINDINGS: Contralateral Common Femoral Vein: Respiratory phasicity is normal and symmetric with the symptomatic side. No evidence of thrombus. Normal compressibility. Common Femoral Vein: No evidence of thrombus. Normal compressibility, respiratory phasicity and response to augmentation. Saphenofemoral Junction: No evidence of thrombus. Normal compressibility and flow on color Doppler imaging. Profunda Femoral Vein: No evidence of thrombus. Normal compressibility and flow on color Doppler imaging. Femoral Vein: No evidence of thrombus. Normal compressibility, respiratory phasicity and response to augmentation. Popliteal Vein: No evidence of thrombus. Normal compressibility, respiratory phasicity and response to augmentation. Calf Veins: Limited evaluation of the deep calf veins. No clear evidence of thrombus. Other Findings: Poorly defined heterogeneous structure along the medial side of the right knee at the area of redness and pain. This structure roughly measures 5.7 x 1.8 x 5.7 cm. This structure is slightly hyperechoic. No significant vascular flow within this structure. IMPRESSION: 1. Negative for deep venous thrombosis in right lower extremity. Limited evaluation of the right deep calf veins due to the edema. 2. Poorly defined heterogeneous structure along the medial aspect of the  right knee at the area of pain and redness. Structure is indeterminate but favor hematoma. Recommend clinical  correlation. Electronically Signed   By: Markus Daft M.D.   On: 01/14/2020 19:48    Disposition:      Follow-up Information    Duanne Guess, PA-C. Go in 2 week(s).   Specialties: Orthopedic Surgery, Emergency Medicine Why: For staple removal Contact information: [place] [address] [phone]                Signed: Dorise Hiss [facility] 01/16/2020, 9:15 AM

## 2020-01-17 DIAGNOSIS — Z471 Aftercare following joint replacement surgery: Secondary | ICD-10-CM | POA: Diagnosis not present

## 2020-01-19 DIAGNOSIS — Z471 Aftercare following joint replacement surgery: Secondary | ICD-10-CM | POA: Diagnosis not present

## 2020-01-20 LAB — CULTURE, BLOOD (ROUTINE X 2)
Culture: NO GROWTH
Culture: NO GROWTH

## 2020-01-23 DIAGNOSIS — Z471 Aftercare following joint replacement surgery: Secondary | ICD-10-CM | POA: Diagnosis not present

## 2020-01-24 DIAGNOSIS — Z471 Aftercare following joint replacement surgery: Secondary | ICD-10-CM | POA: Diagnosis not present

## 2020-01-28 ENCOUNTER — Emergency Department
Admission: EM | Admit: 2020-01-28 | Discharge: 2020-01-28 | Disposition: A | Discharge status: Home / Self Care | Payer: Medicaid Other | Attending: Emergency Medicine | Admitting: Emergency Medicine

## 2020-01-28 ENCOUNTER — Emergency Department: Payer: Medicaid Other

## 2020-01-28 ENCOUNTER — Other Ambulatory Visit: Payer: Self-pay

## 2020-01-28 DIAGNOSIS — R58 Hemorrhage, not elsewhere classified: Secondary | ICD-10-CM | POA: Diagnosis not present

## 2020-01-28 DIAGNOSIS — R52 Pain, unspecified: Secondary | ICD-10-CM | POA: Diagnosis not present

## 2020-01-28 DIAGNOSIS — Z5321 Procedure and treatment not carried out due to patient leaving prior to being seen by health care provider: Secondary | ICD-10-CM | POA: Diagnosis not present

## 2020-01-28 DIAGNOSIS — Z96651 Presence of right artificial knee joint: Secondary | ICD-10-CM | POA: Diagnosis not present

## 2020-01-28 DIAGNOSIS — Z471 Aftercare following joint replacement surgery: Secondary | ICD-10-CM | POA: Diagnosis not present

## 2020-01-28 DIAGNOSIS — M25461 Effusion, right knee: Secondary | ICD-10-CM | POA: Diagnosis not present

## 2020-01-28 DIAGNOSIS — R600 Localized edema: Secondary | ICD-10-CM | POA: Diagnosis not present

## 2020-01-28 DIAGNOSIS — M96830 Postprocedural hemorrhage and hematoma of a musculoskeletal structure following a musculoskeletal system procedure: Secondary | ICD-10-CM | POA: Diagnosis present

## 2020-01-28 LAB — CBC
HCT: 30.2 % — ABNORMAL LOW (ref 36.0–46.0)
Hemoglobin: 9.8 g/dL — ABNORMAL LOW (ref 12.0–15.0)
MCH: 26.7 pg (ref 26.0–34.0)
MCHC: 32.5 g/dL (ref 30.0–36.0)
MCV: 82.3 fL (ref 80.0–100.0)
Platelets: 203 10*3/uL (ref 150–400)
RBC: 3.67 MIL/uL — ABNORMAL LOW (ref 3.87–5.11)
RDW: 20 % — ABNORMAL HIGH (ref 11.5–15.5)
WBC: 5.4 10*3/uL (ref 4.0–10.5)
nRBC: 0 % (ref 0.0–0.2)

## 2020-01-28 LAB — POCT PREGNANCY, URINE: Preg Test, Ur: NEGATIVE

## 2020-01-28 LAB — COMPREHENSIVE METABOLIC PANEL
ALT: 23 U/L (ref 0–44)
AST: 41 U/L (ref 15–41)
Albumin: 3 g/dL — ABNORMAL LOW (ref 3.5–5.0)
Alkaline Phosphatase: 124 U/L (ref 38–126)
Anion gap: 8 (ref 5–15)
BUN: 8 mg/dL (ref 6–20)
CO2: 23 mmol/L (ref 22–32)
Calcium: 8.3 mg/dL — ABNORMAL LOW (ref 8.9–10.3)
Chloride: 103 mmol/L (ref 98–111)
Creatinine, Ser: 0.4 mg/dL — ABNORMAL LOW (ref 0.44–1.00)
GFR calc Af Amer: >60 mL/min (ref >60)
GFR calc non Af Amer: >60 mL/min (ref >60)
Glucose, Bld: 104 mg/dL — ABNORMAL HIGH (ref 70–99)
Potassium: 3.8 mmol/L (ref 3.5–5.1)
Sodium: 134 mmol/L — ABNORMAL LOW (ref 135–145)
Total Bilirubin: 1.3 mg/dL — ABNORMAL HIGH (ref 0.3–1.2)
Total Protein: 7.4 g/dL (ref 6.5–8.1)

## 2020-01-28 LAB — LACTIC ACID, PLASMA: Lactic Acid, Venous: 1 mmol/L (ref 0.5–1.9)

## 2020-01-28 MED ORDER — FENTANYL CITRATE (PF) 100 MCG/2ML IJ SOLN
50.0000 ug | INTRAMUSCULAR | Status: DC | PRN
  Administered 2020-01-28: 50 ug via INTRAVENOUS
  Filled 2020-01-28: qty 2

## 2020-01-28 NOTE — ED Notes (Signed)
Patient called, no answer. Patient not seen in lobby, bathroom, or outside.

## 2020-01-28 NOTE — ED Notes (Signed)
Called, no answer. Patient not seen in lobby, bathroom, or outside.

## 2020-01-28 NOTE — ED Triage Notes (Signed)
Pt arrives via ems from home. Pt had right total knee replacement aug 5th, took some of staples out 8/18. When in bed this morning pt states " I felt something wet and looked down and there was blood everywhere". Wound appears to be opening near the top of the wound incision. Currently wrapped with gauze and bleeding controled. Redness and warmth noted around wound on rt knee. NAD noted at this time

## 2020-01-28 NOTE — ED Triage Notes (Signed)
Pt presents via EMS c/o right knee bleeding. S/p right knee replacement 01/11/2020. Reports staples removed last week. Bleeding controlled. 75 mcg Fentanyl given by EMS.

## 2020-01-28 NOTE — ED Notes (Signed)
Pt requesting to have IV removed and wants to leave. Pt adamant about leaving.

## 2020-01-28 NOTE — ED Notes (Signed)
Hunter RN informed this RN that patient had reported he was leaving as soon as his ride arrived. Patient not seen in lobby, bathroom, or outside.

## 2020-01-30 DIAGNOSIS — T8130XA Disruption of wound, unspecified, initial encounter: Secondary | ICD-10-CM | POA: Diagnosis not present

## 2020-01-30 DIAGNOSIS — Z96651 Presence of right artificial knee joint: Secondary | ICD-10-CM | POA: Diagnosis not present

## 2020-01-31 ENCOUNTER — Encounter: Payer: Medicaid Other | Admitting: Obstetrics and Gynecology
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
## 2020-02-01 DIAGNOSIS — T8189XA Other complications of procedures, not elsewhere classified, initial encounter: Secondary | ICD-10-CM | POA: Diagnosis not present

## 2020-02-03 ENCOUNTER — Inpatient Hospital Stay
Admission: EM | Admit: 2020-02-03 | Discharge: 2020-02-09 | DRG: 856 | Disposition: A | Discharge status: Home / Self Care | Payer: Medicaid Other | Attending: Internal Medicine | Admitting: Internal Medicine

## 2020-02-03 ENCOUNTER — Other Ambulatory Visit: Payer: Self-pay

## 2020-02-03 ENCOUNTER — Emergency Department: Payer: Medicaid Other

## 2020-02-03 ENCOUNTER — Encounter: Payer: Self-pay | Admitting: Emergency Medicine

## 2020-02-03 DIAGNOSIS — Z20822 Contact with and (suspected) exposure to covid-19: Secondary | ICD-10-CM | POA: Diagnosis present

## 2020-02-03 DIAGNOSIS — K7290 Hepatic failure, unspecified without coma: Secondary | ICD-10-CM | POA: Diagnosis present

## 2020-02-03 DIAGNOSIS — E876 Hypokalemia: Secondary | ICD-10-CM | POA: Diagnosis present

## 2020-02-03 DIAGNOSIS — T8144XA Sepsis following a procedure, initial encounter: Secondary | ICD-10-CM | POA: Diagnosis present

## 2020-02-03 DIAGNOSIS — A419 Sepsis, unspecified organism: Secondary | ICD-10-CM | POA: Diagnosis present

## 2020-02-03 DIAGNOSIS — D62 Acute posthemorrhagic anemia: Secondary | ICD-10-CM | POA: Diagnosis not present

## 2020-02-03 DIAGNOSIS — Z471 Aftercare following joint replacement surgery: Secondary | ICD-10-CM | POA: Diagnosis not present

## 2020-02-03 DIAGNOSIS — F418 Other specified anxiety disorders: Secondary | ICD-10-CM | POA: Diagnosis not present

## 2020-02-03 DIAGNOSIS — I10 Essential (primary) hypertension: Secondary | ICD-10-CM | POA: Diagnosis present

## 2020-02-03 DIAGNOSIS — Z720 Tobacco use: Secondary | ICD-10-CM | POA: Diagnosis not present

## 2020-02-03 DIAGNOSIS — M25561 Pain in right knee: Secondary | ICD-10-CM

## 2020-02-03 DIAGNOSIS — F10231 Alcohol dependence with withdrawal delirium: Secondary | ICD-10-CM | POA: Diagnosis not present

## 2020-02-03 DIAGNOSIS — Y838 Other surgical procedures as the cause of abnormal reaction of the patient, or of later complication, without mention of misadventure at the time of the procedure: Secondary | ICD-10-CM | POA: Diagnosis present

## 2020-02-03 DIAGNOSIS — B182 Chronic viral hepatitis C: Secondary | ICD-10-CM | POA: Diagnosis present

## 2020-02-03 DIAGNOSIS — Z8541 Personal history of malignant neoplasm of cervix uteri: Secondary | ICD-10-CM

## 2020-02-03 DIAGNOSIS — L089 Local infection of the skin and subcutaneous tissue, unspecified: Secondary | ICD-10-CM | POA: Diagnosis not present

## 2020-02-03 DIAGNOSIS — Z7982 Long term (current) use of aspirin: Secondary | ICD-10-CM

## 2020-02-03 DIAGNOSIS — G8929 Other chronic pain: Secondary | ICD-10-CM | POA: Diagnosis present

## 2020-02-03 DIAGNOSIS — F17210 Nicotine dependence, cigarettes, uncomplicated: Secondary | ICD-10-CM | POA: Diagnosis present

## 2020-02-03 DIAGNOSIS — R0902 Hypoxemia: Secondary | ICD-10-CM | POA: Diagnosis not present

## 2020-02-03 DIAGNOSIS — T8189XA Other complications of procedures, not elsewhere classified, initial encounter: Secondary | ICD-10-CM | POA: Diagnosis not present

## 2020-02-03 DIAGNOSIS — T8142XA Infection following a procedure, deep incisional surgical site, initial encounter: Secondary | ICD-10-CM | POA: Diagnosis not present

## 2020-02-03 DIAGNOSIS — F1290 Cannabis use, unspecified, uncomplicated: Secondary | ICD-10-CM | POA: Diagnosis present

## 2020-02-03 DIAGNOSIS — Z79891 Long term (current) use of opiate analgesic: Secondary | ICD-10-CM

## 2020-02-03 DIAGNOSIS — T8131XA Disruption of external operation (surgical) wound, not elsewhere classified, initial encounter: Secondary | ICD-10-CM | POA: Diagnosis present

## 2020-02-03 DIAGNOSIS — Z6825 Body mass index (BMI) 25.0-25.9, adult: Secondary | ICD-10-CM | POA: Diagnosis not present

## 2020-02-03 DIAGNOSIS — G8918 Other acute postprocedural pain: Secondary | ICD-10-CM

## 2020-02-03 DIAGNOSIS — Z9119 Patient's noncompliance with other medical treatment and regimen: Secondary | ICD-10-CM

## 2020-02-03 DIAGNOSIS — D649 Anemia, unspecified: Secondary | ICD-10-CM

## 2020-02-03 DIAGNOSIS — K7030 Alcoholic cirrhosis of liver without ascites: Secondary | ICD-10-CM | POA: Diagnosis present

## 2020-02-03 DIAGNOSIS — J441 Chronic obstructive pulmonary disease with (acute) exacerbation: Secondary | ICD-10-CM | POA: Diagnosis present

## 2020-02-03 DIAGNOSIS — Z96651 Presence of right artificial knee joint: Secondary | ICD-10-CM | POA: Diagnosis present

## 2020-02-03 DIAGNOSIS — T8453XA Infection and inflammatory reaction due to internal right knee prosthesis, initial encounter: Secondary | ICD-10-CM | POA: Diagnosis not present

## 2020-02-03 DIAGNOSIS — Z7141 Alcohol abuse counseling and surveillance of alcoholic: Secondary | ICD-10-CM

## 2020-02-03 DIAGNOSIS — F1010 Alcohol abuse, uncomplicated: Secondary | ICD-10-CM | POA: Diagnosis present

## 2020-02-03 DIAGNOSIS — D638 Anemia in other chronic diseases classified elsewhere: Secondary | ICD-10-CM | POA: Diagnosis present

## 2020-02-03 DIAGNOSIS — Z79899 Other long term (current) drug therapy: Secondary | ICD-10-CM

## 2020-02-03 DIAGNOSIS — M25461 Effusion, right knee: Secondary | ICD-10-CM | POA: Diagnosis not present

## 2020-02-03 DIAGNOSIS — R58 Hemorrhage, not elsewhere classified: Secondary | ICD-10-CM | POA: Diagnosis not present

## 2020-02-03 DIAGNOSIS — D509 Iron deficiency anemia, unspecified: Secondary | ICD-10-CM | POA: Diagnosis present

## 2020-02-03 DIAGNOSIS — T8130XA Disruption of wound, unspecified, initial encounter: Secondary | ICD-10-CM | POA: Insufficient documentation

## 2020-02-03 DIAGNOSIS — Z8249 Family history of ischemic heart disease and other diseases of the circulatory system: Secondary | ICD-10-CM

## 2020-02-03 DIAGNOSIS — K219 Gastro-esophageal reflux disease without esophagitis: Secondary | ICD-10-CM | POA: Diagnosis present

## 2020-02-03 DIAGNOSIS — S82201A Unspecified fracture of shaft of right tibia, initial encounter for closed fracture: Secondary | ICD-10-CM | POA: Diagnosis not present

## 2020-02-03 DIAGNOSIS — M7989 Other specified soft tissue disorders: Secondary | ICD-10-CM | POA: Diagnosis not present

## 2020-02-03 DIAGNOSIS — S76111A Strain of right quadriceps muscle, fascia and tendon, initial encounter: Secondary | ICD-10-CM | POA: Diagnosis present

## 2020-02-03 DIAGNOSIS — E669 Obesity, unspecified: Secondary | ICD-10-CM | POA: Diagnosis present

## 2020-02-03 DIAGNOSIS — G9341 Metabolic encephalopathy: Secondary | ICD-10-CM | POA: Diagnosis present

## 2020-02-03 DIAGNOSIS — M65161 Other infective (teno)synovitis, right knee: Secondary | ICD-10-CM | POA: Diagnosis present

## 2020-02-03 DIAGNOSIS — R52 Pain, unspecified: Secondary | ICD-10-CM | POA: Diagnosis not present

## 2020-02-03 DIAGNOSIS — F10931 Alcohol use, unspecified with withdrawal delirium: Secondary | ICD-10-CM | POA: Diagnosis present

## 2020-02-03 DIAGNOSIS — R404 Transient alteration of awareness: Secondary | ICD-10-CM | POA: Diagnosis not present

## 2020-02-03 DIAGNOSIS — R000 Tachycardia, unspecified: Secondary | ICD-10-CM | POA: Diagnosis present

## 2020-02-03 DIAGNOSIS — Z7289 Other problems related to lifestyle: Secondary | ICD-10-CM

## 2020-02-03 DIAGNOSIS — K7682 Hepatic encephalopathy: Secondary | ICD-10-CM | POA: Diagnosis present

## 2020-02-03 DIAGNOSIS — F411 Generalized anxiety disorder: Secondary | ICD-10-CM | POA: Diagnosis present

## 2020-02-03 DIAGNOSIS — Z886 Allergy status to analgesic agent status: Secondary | ICD-10-CM

## 2020-02-03 DIAGNOSIS — J449 Chronic obstructive pulmonary disease, unspecified: Secondary | ICD-10-CM | POA: Diagnosis not present

## 2020-02-03 LAB — MAGNESIUM: Magnesium: 1.7 mg/dL (ref 1.7–2.4)

## 2020-02-03 LAB — COMPREHENSIVE METABOLIC PANEL
ALT: 20 U/L (ref 0–44)
AST: 39 U/L (ref 15–41)
Albumin: 2.9 g/dL — ABNORMAL LOW (ref 3.5–5.0)
Alkaline Phosphatase: 106 U/L (ref 38–126)
Anion gap: 11 (ref 5–15)
BUN: 6 mg/dL (ref 6–20)
CO2: 24 mmol/L (ref 22–32)
Calcium: 8.4 mg/dL — ABNORMAL LOW (ref 8.9–10.3)
Chloride: 98 mmol/L (ref 98–111)
Creatinine, Ser: 0.47 mg/dL (ref 0.44–1.00)
GFR calc Af Amer: >60 mL/min (ref >60)
GFR calc non Af Amer: >60 mL/min (ref >60)
Glucose, Bld: 132 mg/dL — ABNORMAL HIGH (ref 70–99)
Potassium: 3.8 mmol/L (ref 3.5–5.1)
Sodium: 133 mmol/L — ABNORMAL LOW (ref 135–145)
Total Bilirubin: 1.4 mg/dL — ABNORMAL HIGH (ref 0.3–1.2)
Total Protein: 7.1 g/dL (ref 6.5–8.1)

## 2020-02-03 LAB — SEDIMENTATION RATE: Sed Rate: 58 mm/hr — ABNORMAL HIGH (ref 0–20)

## 2020-02-03 LAB — CBC WITH DIFFERENTIAL/PLATELET
Abs Immature Granulocytes: 0.02 10*3/uL (ref 0.00–0.07)
Basophils Absolute: 0 10*3/uL (ref 0.0–0.1)
Basophils Relative: 1 %
Eosinophils Absolute: 0 10*3/uL (ref 0.0–0.5)
Eosinophils Relative: 0 %
HCT: 26.4 % — ABNORMAL LOW (ref 36.0–46.0)
Hemoglobin: 8.6 g/dL — ABNORMAL LOW (ref 12.0–15.0)
Immature Granulocytes: 0 %
Lymphocytes Relative: 13 %
Lymphs Abs: 0.7 10*3/uL (ref 0.7–4.0)
MCH: 27 pg (ref 26.0–34.0)
MCHC: 32.6 g/dL (ref 30.0–36.0)
MCV: 82.8 fL (ref 80.0–100.0)
Monocytes Absolute: 0.6 10*3/uL (ref 0.1–1.0)
Monocytes Relative: 12 %
Neutro Abs: 3.9 10*3/uL (ref 1.7–7.7)
Neutrophils Relative %: 74 %
Platelets: 173 10*3/uL (ref 150–400)
RBC: 3.19 MIL/uL — ABNORMAL LOW (ref 3.87–5.11)
RDW: 19.2 % — ABNORMAL HIGH (ref 11.5–15.5)
WBC: 5.3 10*3/uL (ref 4.0–10.5)
nRBC: 0 % (ref 0.0–0.2)

## 2020-02-03 LAB — SARS CORONAVIRUS 2 BY RT PCR (HOSPITAL ORDER, PERFORMED IN ~~LOC~~ HOSPITAL LAB): SARS Coronavirus 2: NEGATIVE

## 2020-02-03 LAB — LACTIC ACID, PLASMA: Lactic Acid, Venous: 1.8 mmol/L (ref 0.5–1.9)

## 2020-02-03 LAB — C-REACTIVE PROTEIN: CRP: 1.6 mg/dL — ABNORMAL HIGH (ref <1.0)

## 2020-02-03 LAB — AMMONIA: Ammonia: 31 umol/L (ref 9–35)

## 2020-02-03 MED ORDER — SUCRALFATE 1 G PO TABS
1.0000 g | ORAL_TABLET | Freq: Three times a day (TID) | ORAL | Status: DC
  Administered 2020-02-03 – 2020-02-09 (×21): 1 g via ORAL
  Filled 2020-02-03 (×22): qty 1

## 2020-02-03 MED ORDER — ADULT MULTIVITAMIN W/MINERALS CH
1.0000 | ORAL_TABLET | Freq: Every day | ORAL | Status: DC
  Administered 2020-02-03: 1 via ORAL
  Filled 2020-02-03: qty 1

## 2020-02-03 MED ORDER — FERROUS SULFATE 325 (65 FE) MG PO TABS
325.0000 mg | ORAL_TABLET | Freq: Every day | ORAL | Status: DC
  Administered 2020-02-05 – 2020-02-09 (×5): 325 mg via ORAL
  Filled 2020-02-03 (×6): qty 1

## 2020-02-03 MED ORDER — POLYETHYLENE GLYCOL 3350 17 G PO PACK: 17.0000 g | PACK | Freq: Every day | ORAL | Status: DC | PRN

## 2020-02-03 MED ORDER — SODIUM CHLORIDE 0.9 % IV SOLN: INTRAVENOUS | Status: DC

## 2020-02-03 MED ORDER — DICYCLOMINE HCL 20 MG PO TABS
20.0000 mg | ORAL_TABLET | Freq: Three times a day (TID) | ORAL | Status: DC
  Administered 2020-02-03 – 2020-02-09 (×22): 20 mg via ORAL
  Filled 2020-02-03 (×25): qty 1

## 2020-02-03 MED ORDER — PIPERACILLIN-TAZOBACTAM 3.375 G IVPB 30 MIN
3.3750 g | Freq: Once | INTRAVENOUS | Status: AC
  Administered 2020-02-03: 3.375 g via INTRAVENOUS
  Filled 2020-02-03: qty 50

## 2020-02-03 MED ORDER — SPIRONOLACTONE 25 MG PO TABS
25.0000 mg | ORAL_TABLET | Freq: Every day | ORAL | Status: DC
  Administered 2020-02-03 – 2020-02-09 (×6): 25 mg via ORAL
  Filled 2020-02-03 (×7): qty 1

## 2020-02-03 MED ORDER — METOPROLOL SUCCINATE ER 25 MG PO TB24
12.5000 mg | ORAL_TABLET | Freq: Every day | ORAL | Status: DC
  Administered 2020-02-05 – 2020-02-09 (×4): 12.5 mg via ORAL
  Filled 2020-02-03 (×4): qty 1
  Filled 2020-02-03: qty 0.5
  Filled 2020-02-03: qty 1

## 2020-02-03 MED ORDER — DULOXETINE HCL 20 MG PO CPEP
20.0000 mg | ORAL_CAPSULE | Freq: Every day | ORAL | Status: DC
  Administered 2020-02-03 – 2020-02-09 (×6): 20 mg via ORAL
  Filled 2020-02-03 (×7): qty 1

## 2020-02-03 MED ORDER — MIDODRINE HCL 5 MG PO TABS
5.0000 mg | ORAL_TABLET | Freq: Two times a day (BID) | ORAL | Status: DC
  Filled 2020-02-03: qty 1

## 2020-02-03 MED ORDER — ONDANSETRON HCL 4 MG/2ML IJ SOLN: 4.0000 mg | Freq: Four times a day (QID) | INTRAMUSCULAR | Status: DC | PRN

## 2020-02-03 MED ORDER — MIDODRINE HCL 5 MG PO TABS
5.0000 mg | ORAL_TABLET | Freq: Two times a day (BID) | ORAL | Status: DC | PRN
  Administered 2020-02-06: 5 mg via ORAL
  Filled 2020-02-03 (×2): qty 1

## 2020-02-03 MED ORDER — THIAMINE HCL 100 MG PO TABS
100.0000 mg | ORAL_TABLET | Freq: Every day | ORAL | Status: DC
  Administered 2020-02-03: 100 mg via ORAL
  Filled 2020-02-03: qty 1

## 2020-02-03 MED ORDER — MORPHINE SULFATE (PF) 2 MG/ML IV SOLN
2.0000 mg | INTRAVENOUS | Status: DC | PRN
  Administered 2020-02-03 – 2020-02-04 (×4): 2 mg via INTRAVENOUS
  Filled 2020-02-03 (×4): qty 1

## 2020-02-03 MED ORDER — ONDANSETRON HCL 4 MG PO TABS: 4.0000 mg | ORAL_TABLET | Freq: Four times a day (QID) | ORAL | Status: DC | PRN

## 2020-02-03 MED ORDER — FENTANYL CITRATE (PF) 100 MCG/2ML IJ SOLN
50.0000 ug | Freq: Once | INTRAMUSCULAR | Status: AC
  Administered 2020-02-03: 50 ug via INTRAVENOUS
  Filled 2020-02-03: qty 2

## 2020-02-03 MED ORDER — PANTOPRAZOLE SODIUM 40 MG PO TBEC
40.0000 mg | DELAYED_RELEASE_TABLET | Freq: Every day | ORAL | Status: DC
  Administered 2020-02-03 – 2020-02-09 (×6): 40 mg via ORAL
  Filled 2020-02-03 (×6): qty 1

## 2020-02-03 MED ORDER — MAGNESIUM OXIDE 400 (241.3 MG) MG PO TABS
400.0000 mg | ORAL_TABLET | Freq: Every day | ORAL | Status: DC
  Administered 2020-02-03 – 2020-02-09 (×6): 400 mg via ORAL
  Filled 2020-02-03 (×6): qty 1

## 2020-02-03 MED ORDER — FOLIC ACID 1 MG PO TABS
1.0000 mg | ORAL_TABLET | Freq: Every day | ORAL | Status: DC
  Administered 2020-02-03: 1 mg via ORAL
  Filled 2020-02-03: qty 1

## 2020-02-03 MED ORDER — SENNA 8.6 MG PO TABS
1.0000 | ORAL_TABLET | Freq: Two times a day (BID) | ORAL | Status: DC
  Administered 2020-02-03 – 2020-02-09 (×8): 8.6 mg via ORAL
  Filled 2020-02-03 (×12): qty 1

## 2020-02-03 MED ORDER — LACTULOSE 10 GM/15ML PO SOLN
30.0000 g | Freq: Two times a day (BID) | ORAL | Status: DC
  Administered 2020-02-03 – 2020-02-04 (×3): 30 g via ORAL
  Filled 2020-02-03 (×3): qty 60

## 2020-02-03 MED ORDER — LACTATED RINGERS IV BOLUS
1000.0000 mL | Freq: Once | INTRAVENOUS | Status: AC
  Administered 2020-02-03: 1000 mL via INTRAVENOUS

## 2020-02-03 MED ORDER — OXYCODONE HCL 5 MG PO TABS
5.0000 mg | ORAL_TABLET | Freq: Once | ORAL | Status: AC
  Administered 2020-02-03: 5 mg via ORAL
  Filled 2020-02-03: qty 1

## 2020-02-03 MED ORDER — OXYCODONE HCL 5 MG PO TABS
5.0000 mg | ORAL_TABLET | ORAL | Status: DC | PRN
  Administered 2020-02-03 – 2020-02-05 (×6): 5 mg via ORAL
  Filled 2020-02-03 (×6): qty 1

## 2020-02-03 MED ORDER — TRAMADOL HCL 50 MG PO TABS
50.0000 mg | ORAL_TABLET | Freq: Three times a day (TID) | ORAL | Status: DC | PRN
  Administered 2020-02-04 – 2020-02-06 (×2): 50 mg via ORAL
  Filled 2020-02-03 (×2): qty 1

## 2020-02-03 MED ORDER — GABAPENTIN 300 MG PO CAPS
300.0000 mg | ORAL_CAPSULE | Freq: Three times a day (TID) | ORAL | Status: DC | PRN
  Administered 2020-02-03 – 2020-02-07 (×5): 300 mg via ORAL
  Filled 2020-02-03 (×5): qty 1

## 2020-02-03 MED ORDER — BISACODYL 10 MG RE SUPP
10.0000 mg | Freq: Every day | RECTAL | Status: DC | PRN
  Filled 2020-02-03: qty 1

## 2020-02-03 MED ORDER — VANCOMYCIN HCL 1500 MG/300ML IV SOLN
1500.0000 mg | Freq: Once | INTRAVENOUS | Status: AC
  Administered 2020-02-03: 1500 mg via INTRAVENOUS
  Filled 2020-02-03: qty 300

## 2020-02-03 MED ORDER — FUROSEMIDE 20 MG PO TABS
20.0000 mg | ORAL_TABLET | Freq: Every day | ORAL | Status: DC
  Administered 2020-02-03 – 2020-02-09 (×6): 20 mg via ORAL
  Filled 2020-02-03 (×6): qty 1

## 2020-02-03 MED ORDER — OLANZAPINE 5 MG PO TABS
10.0000 mg | ORAL_TABLET | Freq: Every day | ORAL | Status: DC
  Administered 2020-02-03 – 2020-02-08 (×6): 10 mg via ORAL
  Filled 2020-02-03 (×4): qty 2
  Filled 2020-02-03: qty 1
  Filled 2020-02-03: qty 2

## 2020-02-03 MED ORDER — CYANOCOBALAMIN 500 MCG PO TABS
500.0000 ug | ORAL_TABLET | Freq: Every day | ORAL | Status: DC
  Administered 2020-02-03 – 2020-02-09 (×6): 500 ug via ORAL
  Filled 2020-02-03 (×7): qty 1

## 2020-02-03 NOTE — ED Notes (Addendum)
Pt continues to pull off knee bandage. Pt has no memory of doing such. Pt complains of pain and feels like she, "is going crazy".  Tech informed Mitch rn of pt request for pain medicine.   Tech rewrapped knee with help from [facility].   lw edt
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
## 2020-02-03 NOTE — ED Notes (Signed)
Pt given water and HOB afjusted

## 2020-02-03 NOTE — ED Notes (Signed)
Admitting provider at bedside.
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
## 2020-02-03 NOTE — ED Notes (Signed)
Pt called out multiple times for pain meds. Per admitting MD alternate PRN morphine and oxycodone.

## 2020-02-03 NOTE — ED Notes (Signed)
Pt right knee and left shoulder supported by additional pillows

## 2020-02-03 NOTE — Anesthesia Preprocedure Evaluation (Deleted)
Anesthesia Evaluation    Airway        Dental   Pulmonary Current Smoker and Patient abstained from smoking.,           Cardiovascular hypertension,      Neuro/Psych    GI/Hepatic   Endo/Other    Renal/GU      Musculoskeletal   Abdominal   Peds  Hematology   Anesthesia Other Findings   Reproductive/Obstetrics                             Anesthesia Physical Anesthesia Plan  ASA: IV  Anesthesia Plan: General   Post-op Pain Management:    Induction: Intravenous  PONV Risk Score and Plan: 4 or greater and Ondansetron and Dexamethasone  Airway Management Planned: Oral ETT  Additional Equipment: None  Intra-op Plan:   Post-operative Plan: Extubation in OR  Informed Consent: I have reviewed the patients History and Physical, chart, labs and discussed the procedure including the risks, benefits and alternatives for the proposed anesthesia with the patient or authorized representative who has indicated his/her understanding and acceptance.     Consent reviewed with POA  Plan Discussed with: CRNA  Anesthesia Plan Comments: (8/28/ @ 1900, Arita Miss MD: Discussed risks of anesthesia with patient's husband Tracey Harries via phone since patient has altered mental status likely 2/2 sepsis. Risks discussed included PONV, sore throat, lip/dental damage. Rare risks discussed as well, such as cardiorespiratory and neurological sequelae. Husband understands she is at higher risk due to her acute medical issues and sepsis. )       Anesthesia Quick Evaluation

## 2020-02-03 NOTE — ED Triage Notes (Signed)
Pt arrives from home via [facility]. Pt had rt knee replacement earlier this month and surdgical would has dehisced. Pt tachycardic at 136, hypertensive at bp 136/67 and temp of 99.5. EMS reported pt was hallucinating during transport.

## 2020-02-03 NOTE — ED Notes (Signed)
Pt provided incontinence care and pure-wick replaced by this tech and Zach,NT.
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
## 2020-02-03 NOTE — Consult Note (Signed)
PHARMACY -  BRIEF ANTIBIOTIC NOTE   Pharmacy has received consult(s) for vancomycin for wound infection - septic right knee s/p recent TKA from an ED provider.  The patient's profile has been reviewed for ht/wt/allergies/indication/available labs.    One time order(s) placed for vancomycin 1500 mg x 1 dose   Further antibiotics/pharmacy consults should be ordered by admitting physician if indicated.                       Thank you,  Benn Moulder, PharmD Pharmacy Resident  02/03/2020 10:13 AM

## 2020-02-03 NOTE — ED Notes (Addendum)
Consent paper form completed - not signed

## 2020-02-04 ENCOUNTER — Inpatient Hospital Stay: Payer: Medicaid Other

## 2020-02-04 ENCOUNTER — Encounter: Payer: Self-pay | Admitting: Internal Medicine

## 2020-02-04 ENCOUNTER — Inpatient Hospital Stay: Payer: Medicaid Other | Admitting: Anesthesiology

## 2020-02-04 ENCOUNTER — Encounter
Admission: EM | Disposition: A | Discharge status: Home / Self Care | Payer: Self-pay | Source: Home / Self Care | Attending: Internal Medicine

## 2020-02-04 DIAGNOSIS — T8131XA Disruption of external operation (surgical) wound, not elsewhere classified, initial encounter: Secondary | ICD-10-CM | POA: Diagnosis present

## 2020-02-04 DIAGNOSIS — F1010 Alcohol abuse, uncomplicated: Secondary | ICD-10-CM

## 2020-02-04 DIAGNOSIS — Z720 Tobacco use: Secondary | ICD-10-CM

## 2020-02-04 HISTORY — PX: TOTAL KNEE REVISION: SHX996

## 2020-02-04 HISTORY — PX: IRRIGATION AND DEBRIDEMENT KNEE: SHX5185

## 2020-02-04 LAB — CBC
HCT: 26.3 % — ABNORMAL LOW (ref 36.0–46.0)
Hemoglobin: 8.3 g/dL — ABNORMAL LOW (ref 12.0–15.0)
MCH: 26.5 pg (ref 26.0–34.0)
MCHC: 31.6 g/dL (ref 30.0–36.0)
MCV: 84 fL (ref 80.0–100.0)
Platelets: 155 10*3/uL (ref 150–400)
RBC: 3.13 MIL/uL — ABNORMAL LOW (ref 3.87–5.11)
RDW: 19.5 % — ABNORMAL HIGH (ref 11.5–15.5)
WBC: 6.3 10*3/uL (ref 4.0–10.5)
nRBC: 0 % (ref 0.0–0.2)

## 2020-02-04 LAB — MAGNESIUM: Magnesium: 1.6 mg/dL — ABNORMAL LOW (ref 1.7–2.4)

## 2020-02-04 LAB — PHOSPHORUS: Phosphorus: 4.4 mg/dL (ref 2.5–4.6)

## 2020-02-04 SURGERY — TOTAL KNEE REVISION
Anesthesia: General | Site: Knee | Laterality: Right

## 2020-02-04 MED ORDER — ADULT MULTIVITAMIN W/MINERALS CH
1.0000 | ORAL_TABLET | Freq: Every day | ORAL | Status: DC
  Administered 2020-02-04 – 2020-02-09 (×6): 1 via ORAL
  Filled 2020-02-04 (×6): qty 1

## 2020-02-04 MED ORDER — DEXAMETHASONE SODIUM PHOSPHATE 10 MG/ML IJ SOLN
INTRAMUSCULAR | Status: DC | PRN
  Administered 2020-02-04: 5 mg via INTRAVENOUS

## 2020-02-04 MED ORDER — MAGNESIUM CITRATE PO SOLN
1.0000 | Freq: Once | ORAL | Status: DC | PRN
  Filled 2020-02-04: qty 296

## 2020-02-04 MED ORDER — SUGAMMADEX SODIUM 200 MG/2ML IV SOLN
INTRAVENOUS | Status: DC | PRN
  Administered 2020-02-04: 150 mg via INTRAVENOUS

## 2020-02-04 MED ORDER — MORPHINE SULFATE (PF) 4 MG/ML IV SOLN
INTRAVENOUS | Status: AC
  Administered 2020-02-04: 4 mg
  Filled 2020-02-04: qty 1

## 2020-02-04 MED ORDER — ONDANSETRON HCL 4 MG PO TABS: 4.0000 mg | ORAL_TABLET | Freq: Four times a day (QID) | ORAL | Status: DC | PRN

## 2020-02-04 MED ORDER — MIDAZOLAM HCL 2 MG/2ML IJ SOLN
1.0000 mg | Freq: Once | INTRAMUSCULAR | Status: AC
  Administered 2020-02-04: 1 mg via INTRAVENOUS

## 2020-02-04 MED ORDER — HYDROMORPHONE HCL 1 MG/ML IJ SOLN
INTRAMUSCULAR | Status: AC
  Filled 2020-02-04: qty 1

## 2020-02-04 MED ORDER — METOCLOPRAMIDE HCL 10 MG PO TABS: 5.0000 mg | ORAL_TABLET | Freq: Three times a day (TID) | ORAL | Status: DC | PRN

## 2020-02-04 MED ORDER — FENTANYL CITRATE (PF) 100 MCG/2ML IJ SOLN
INTRAMUSCULAR | Status: AC
  Administered 2020-02-04: 50 ug via INTRAVENOUS
  Filled 2020-02-04: qty 2

## 2020-02-04 MED ORDER — PROPOFOL 10 MG/ML IV BOLUS
INTRAVENOUS | Status: DC | PRN
  Administered 2020-02-04: 140 mg via INTRAVENOUS

## 2020-02-04 MED ORDER — FOLIC ACID 1 MG PO TABS
1.0000 mg | ORAL_TABLET | Freq: Every day | ORAL | Status: DC
  Administered 2020-02-04 – 2020-02-09 (×6): 1 mg via ORAL
  Filled 2020-02-04 (×6): qty 1

## 2020-02-04 MED ORDER — FENTANYL CITRATE (PF) 100 MCG/2ML IJ SOLN
50.0000 ug | INTRAMUSCULAR | Status: AC | PRN
  Administered 2020-02-04: 50 ug via INTRAVENOUS

## 2020-02-04 MED ORDER — THIAMINE HCL 100 MG PO TABS
100.0000 mg | ORAL_TABLET | Freq: Every day | ORAL | Status: DC
  Administered 2020-02-04 – 2020-02-09 (×5): 100 mg via ORAL
  Filled 2020-02-04 (×6): qty 1

## 2020-02-04 MED ORDER — DOCUSATE SODIUM 100 MG PO CAPS
100.0000 mg | ORAL_CAPSULE | Freq: Two times a day (BID) | ORAL | Status: DC
  Administered 2020-02-05 – 2020-02-09 (×7): 100 mg via ORAL
  Filled 2020-02-04 (×10): qty 1

## 2020-02-04 MED ORDER — FENTANYL CITRATE (PF) 100 MCG/2ML IJ SOLN: 50.0000 ug | INTRAMUSCULAR | Status: AC | PRN

## 2020-02-04 MED ORDER — ONDANSETRON HCL 4 MG/2ML IJ SOLN: 4.0000 mg | Freq: Four times a day (QID) | INTRAMUSCULAR | Status: DC | PRN

## 2020-02-04 MED ORDER — METOPROLOL TARTRATE 5 MG/5ML IV SOLN
INTRAVENOUS | Status: DC | PRN
  Administered 2020-02-04: 2.5 mg via INTRAVENOUS

## 2020-02-04 MED ORDER — ONDANSETRON HCL 4 MG/2ML IJ SOLN
INTRAMUSCULAR | Status: DC | PRN
  Administered 2020-02-04: 4 mg via INTRAVENOUS

## 2020-02-04 MED ORDER — METOCLOPRAMIDE HCL 5 MG/ML IJ SOLN: 5.0000 mg | Freq: Three times a day (TID) | INTRAMUSCULAR | Status: DC | PRN

## 2020-02-04 MED ORDER — FENTANYL CITRATE (PF) 100 MCG/2ML IJ SOLN
INTRAMUSCULAR | Status: AC
  Filled 2020-02-04: qty 2

## 2020-02-04 MED ORDER — MIDAZOLAM HCL 2 MG/2ML IJ SOLN
INTRAMUSCULAR | Status: AC
  Filled 2020-02-04: qty 2

## 2020-02-04 MED ORDER — FENTANYL CITRATE (PF) 100 MCG/2ML IJ SOLN
25.0000 ug | INTRAMUSCULAR | Status: AC | PRN
  Administered 2020-02-04 (×6): 25 ug via INTRAVENOUS

## 2020-02-04 MED ORDER — POLYETHYLENE GLYCOL 3350 17 G PO PACK: 17.0000 g | PACK | Freq: Every day | ORAL | Status: DC | PRN

## 2020-02-04 MED ORDER — VANCOMYCIN HCL 1000 MG IV SOLR
INTRAVENOUS | Status: DC | PRN
  Administered 2020-02-04: 1000 mg

## 2020-02-04 MED ORDER — LORAZEPAM 1 MG PO TABS
1.0000 mg | ORAL_TABLET | ORAL | Status: AC | PRN
  Administered 2020-02-04: 1 mg via ORAL
  Administered 2020-02-04: 2 mg via ORAL
  Administered 2020-02-04 – 2020-02-05 (×3): 1 mg via ORAL
  Filled 2020-02-04 (×2): qty 1
  Filled 2020-02-04: qty 2
  Filled 2020-02-04 (×2): qty 1

## 2020-02-04 MED ORDER — LORAZEPAM 2 MG/ML IJ SOLN: 1.0000 mg | INTRAMUSCULAR | Status: AC | PRN

## 2020-02-04 MED ORDER — HYDROMORPHONE HCL 1 MG/ML IJ SOLN
0.5000 mg | INTRAMUSCULAR | Status: AC | PRN
  Administered 2020-02-04 (×4): 0.5 mg via INTRAVENOUS

## 2020-02-04 MED ORDER — LORAZEPAM 2 MG/ML IJ SOLN
0.5000 mg | Freq: Once | INTRAMUSCULAR | Status: AC
  Administered 2020-02-04: 0.5 mg via INTRAVENOUS

## 2020-02-04 MED ORDER — LACTATED RINGERS IV SOLN: INTRAVENOUS | Status: DC | PRN

## 2020-02-04 MED ORDER — VANCOMYCIN HCL 750 MG/150ML IV SOLN
750.0000 mg | Freq: Two times a day (BID) | INTRAVENOUS | Status: DC
  Administered 2020-02-05 – 2020-02-09 (×8): 750 mg via INTRAVENOUS
  Filled 2020-02-04 (×11): qty 150

## 2020-02-04 MED ORDER — FENTANYL CITRATE (PF) 100 MCG/2ML IJ SOLN
INTRAMUSCULAR | Status: DC | PRN
Start: 2020-02-04 — End: 2020-02-04
  Administered 2020-02-04: 100 ug via INTRAVENOUS

## 2020-02-04 MED ORDER — BISACODYL 10 MG RE SUPP: 10.0000 mg | Freq: Every day | RECTAL | Status: DC | PRN

## 2020-02-04 MED ORDER — LORAZEPAM 2 MG/ML IJ SOLN: 0.5000 mg | Freq: Once | INTRAMUSCULAR | Status: AC

## 2020-02-04 MED ORDER — LORAZEPAM 2 MG/ML IJ SOLN
INTRAMUSCULAR | Status: AC
  Administered 2020-02-04: 0.5 mg via INTRAVENOUS
  Filled 2020-02-04: qty 1

## 2020-02-04 MED ORDER — PIPERACILLIN-TAZOBACTAM 3.375 G IVPB
3.3750 g | Freq: Three times a day (TID) | INTRAVENOUS | Status: DC
  Administered 2020-02-04 – 2020-02-05 (×3): 3.375 g via INTRAVENOUS
  Filled 2020-02-04 (×3): qty 50

## 2020-02-04 MED ORDER — FENTANYL CITRATE (PF) 100 MCG/2ML IJ SOLN
50.0000 ug | Freq: Once | INTRAMUSCULAR | Status: AC
  Administered 2020-02-04: 50 ug via INTRAVENOUS

## 2020-02-04 MED ORDER — VANCOMYCIN HCL 1500 MG/300ML IV SOLN
1500.0000 mg | Freq: Once | INTRAVENOUS | Status: AC
  Administered 2020-02-04: 1500 mg via INTRAVENOUS
  Filled 2020-02-04: qty 300

## 2020-02-04 MED ORDER — ROCURONIUM BROMIDE 100 MG/10ML IV SOLN
INTRAVENOUS | Status: DC | PRN
  Administered 2020-02-04: 40 mg via INTRAVENOUS

## 2020-02-04 MED ORDER — ONDANSETRON HCL 4 MG/2ML IJ SOLN: 4.0000 mg | Freq: Once | INTRAMUSCULAR | Status: DC | PRN

## 2020-02-04 MED ORDER — LIDOCAINE HCL (CARDIAC) PF 100 MG/5ML IV SOSY
PREFILLED_SYRINGE | INTRAVENOUS | Status: DC | PRN
  Administered 2020-02-04: 60 mg via INTRAVENOUS

## 2020-02-04 MED ORDER — THIAMINE HCL 100 MG/ML IJ SOLN
100.0000 mg | Freq: Every day | INTRAMUSCULAR | Status: DC
  Administered 2020-02-05: 100 mg via INTRAVENOUS
  Filled 2020-02-04 (×3): qty 2

## 2020-02-04 SURGICAL SUPPLY — 68 items
BLADE SAW 90X13X1.19 OSCILLAT (BLADE) IMPLANT
BNDG ELASTIC 6X5.8 VLCR STR LF (GAUZE/BANDAGES/DRESSINGS) ×3 IMPLANT
CANISTER SUCT 1200ML W/VALVE (MISCELLANEOUS) ×3 IMPLANT
CANISTER SUCT 3000ML PPV (MISCELLANEOUS) ×6 IMPLANT
CANISTER WOUND CARE 500ML ATS (WOUND CARE) ×3 IMPLANT
CHLORAPREP W/TINT 26 (MISCELLANEOUS) ×6 IMPLANT
COOLER POLAR GLACIER W/PUMP (MISCELLANEOUS) ×3 IMPLANT
COVER BACK TABLE REUSABLE LG (DRAPES) ×3 IMPLANT
COVER WAND RF STERILE (DRAPES) ×3 IMPLANT
CUFF TOURN SGL QUICK 24 (TOURNIQUET CUFF) ×2
CUFF TOURN SGL QUICK 30 (TOURNIQUET CUFF)
CUFF TRNQT CYL 24X4X16.5-23 (TOURNIQUET CUFF) ×1 IMPLANT
CUFF TRNQT CYL 30X4X21-28X (TOURNIQUET CUFF) IMPLANT
DRAPE 3/4 80X56 (DRAPES) ×12 IMPLANT
ELECT CAUTERY BLADE 6.4 (BLADE) ×3 IMPLANT
ELECT REM PT RETURN 9FT ADLT (ELECTROSURGICAL) ×3
ELECTRODE REM PT RTRN 9FT ADLT (ELECTROSURGICAL) ×1 IMPLANT
GAUZE SPONGE 4X4 12PLY STRL (GAUZE/BANDAGES/DRESSINGS) ×3 IMPLANT
GAUZE XEROFORM 1X8 LF (GAUZE/BANDAGES/DRESSINGS) ×3 IMPLANT
GLOVE BIOGEL PI IND STRL 9 (GLOVE) ×1 IMPLANT
GLOVE BIOGEL PI INDICATOR 9 (GLOVE) ×2
GLOVE INDICATOR 8.0 STRL GRN (GLOVE) ×3 IMPLANT
GLOVE SURG ORTHO 8.0 STRL STRW (GLOVE) ×3 IMPLANT
GLOVE SURG SYN 9.0  PF PI (GLOVE) ×2
GLOVE SURG SYN 9.0 PF PI (GLOVE) ×1 IMPLANT
GOWN SRG 2XL LVL 4 RGLN SLV (GOWNS) ×1 IMPLANT
GOWN STRL NON-REIN 2XL LVL4 (GOWNS) ×2
GOWN STRL REUS W/ TWL LRG LVL3 (GOWN DISPOSABLE) ×1 IMPLANT
GOWN STRL REUS W/ TWL XL LVL3 (GOWN DISPOSABLE) ×1 IMPLANT
GOWN STRL REUS W/TWL LRG LVL3 (GOWN DISPOSABLE) ×2
GOWN STRL REUS W/TWL XL LVL3 (GOWN DISPOSABLE) ×2
HANDPIECE VERSAJET DEBRIDEMENT (MISCELLANEOUS) ×3 IMPLANT
HOLDER FOLEY CATH W/STRAP (MISCELLANEOUS) IMPLANT
HOOD PEEL AWAY FLYTE STAYCOOL (MISCELLANEOUS) ×3 IMPLANT
IRRIGATION SURGIPHOR STRL (IV SOLUTION) ×3 IMPLANT
KIT PREVENA INCISION MGT20CM45 (CANNISTER) ×3 IMPLANT
KIT STIMULAN RAPID CURE 5CC (Orthopedic Implant) ×3 IMPLANT
KIT TURNOVER KIT A (KITS) ×3 IMPLANT
KNIFE SCULPS 14X20 (INSTRUMENTS) ×3 IMPLANT
NEEDLE SPNL 18GX3.5 QUINCKE PK (NEEDLE) ×3 IMPLANT
NEEDLE SPNL 20GX3.5 QUINCKE YW (NEEDLE) ×3 IMPLANT
NS IRRIG 1000ML POUR BTL (IV SOLUTION) ×3 IMPLANT
PACK TOTAL KNEE (MISCELLANEOUS) ×3 IMPLANT
PAD WRAPON POLAR KNEE (MISCELLANEOUS) ×1 IMPLANT
PULSAVAC PLUS IRRIG FAN TIP (DISPOSABLE) ×3
SCALPEL PROTECTED #10 DISP (BLADE) ×6 IMPLANT
SOL .9 NS 3000ML IRR  AL (IV SOLUTION) ×2
SOL .9 NS 3000ML IRR UROMATIC (IV SOLUTION) ×1 IMPLANT
SOL PREP PVP 2OZ (MISCELLANEOUS) ×3
SOLUTION PREP PVP 2OZ (MISCELLANEOUS) ×1 IMPLANT
STAPLER SKIN PROX 35W (STAPLE) IMPLANT
SUCTION FRAZIER HANDLE 10FR (MISCELLANEOUS) ×2
SUCTION TUBE FRAZIER 10FR DISP (MISCELLANEOUS) ×1 IMPLANT
SUT DVC 2 QUILL PDO  T11 36X36 (SUTURE) ×2
SUT DVC 2 QUILL PDO T11 36X36 (SUTURE) ×1 IMPLANT
SUT ETHILON 2 0 FS 18 (SUTURE) ×3 IMPLANT
SUT STRATAFIX PDS  2-0 CT-2 (SUTURE) ×2
SUT STRATAFIX PDS 2-0 CT-2 (SUTURE) ×1 IMPLANT
SUT TICRON 2-0 30IN 311381 (SUTURE) IMPLANT
SUT V-LOC 90 ABS DVC 3-0 CL (SUTURE) IMPLANT
SWAB CULTURE AMIES ANAERIB BLU (MISCELLANEOUS) ×6 IMPLANT
SYR 20ML LL LF (SYRINGE) ×3 IMPLANT
SYR 50ML LL SCALE MARK (SYRINGE) ×6 IMPLANT
TIP FAN IRRIG PULSAVAC PLUS (DISPOSABLE) ×1 IMPLANT
TOWEL OR 17X26 4PK STRL BLUE (TOWEL DISPOSABLE) ×3 IMPLANT
TOWER CARTRIDGE SMART MIX (DISPOSABLE) IMPLANT
TRAY FOLEY MTR SLVR 16FR STAT (SET/KITS/TRAYS/PACK) ×3 IMPLANT
WRAPON POLAR PAD KNEE (MISCELLANEOUS) ×3

## 2020-02-04 NOTE — Anesthesia Postprocedure Evaluation (Signed)
Anesthesia Post Note  Patient: Rhonda Martin  Procedure(s) Performed: TOTAL KNEE REVISION (Right Knee) IRRIGATION AND DEBRIDEMENT KNEE (Right Knee)  Patient location during evaluation: PACU Anesthesia Type: General Level of consciousness: awake and alert Pain management: pain level controlled Vital Signs Assessment: post-procedure vital signs reviewed and stable Respiratory status: spontaneous breathing, nonlabored ventilation, respiratory function stable and patient connected to nasal cannula oxygen Cardiovascular status: blood pressure returned to baseline and stable Postop Assessment: no apparent nausea or vomiting Anesthetic complications: no   No complications documented.   Last Vitals:  Vitals:   02/04/20 1728 02/04/20 1932  BP: (!) 142/90 137/80  Pulse: (!) 116 (!) 121  Resp: 18 16  Temp: 37.1 C 37.5 C  SpO2: 96% 96%    Last Pain:  Vitals:   02/04/20 1932  TempSrc: Oral  PainSc:                  Martha Clan

## 2020-02-04 NOTE — Plan of Care (Signed)
  Problem: Education: Goal: Knowledge of General Education information will improve Description: Including pain rating scale, medication(s)/side effects and non-pharmacologic comfort measures Outcome: Progressing   Problem: Health Behavior/Discharge Planning: Goal: Ability to manage health-related needs will improve Outcome: Progressing   Problem: Clinical Measurements: Goal: Ability to maintain clinical measurements within normal limits will improve Outcome: Progressing Goal: Will remain free from infection Outcome: Progressing Goal: Respiratory complications will improve Outcome: Progressing   Problem: Activity: Goal: Risk for activity intolerance will decrease Outcome: Progressing   Problem: Nutrition: Goal: Adequate nutrition will be maintained Outcome: Progressing   Problem: Coping: Goal: Level of anxiety will decrease Outcome: Progressing   Problem: Elimination: Goal: Will not experience complications related to bowel motility Outcome: Progressing Goal: Will not experience complications related to urinary retention Outcome: Progressing   Problem: Pain Managment: Goal: General experience of comfort will improve Outcome: Progressing   Problem: Safety: Goal: Ability to remain free from injury will improve Outcome: Progressing   Problem: Skin Integrity: Goal: Risk for impaired skin integrity will decrease Outcome: Progressing   

## 2020-02-04 NOTE — Progress Notes (Signed)
   02/04/20 1509  Assess: MEWS Score  Temp 98.4 F (36.9 C)  BP 139/83  Pulse Rate (!) 115  Resp 17  SpO2 96 %  O2 Device Room Air  Assess: MEWS Score  MEWS Temp 0  MEWS Systolic 0  MEWS Pulse 2  MEWS RR 0  MEWS LOC 0  MEWS Score 2  MEWS Score Color Yellow  Assess: if the MEWS score is Yellow or Red  Were vital signs taken at a resting state? Yes  Focused Assessment No change from prior assessment  Early Detection of Sepsis Score *See Row Information* Low  MEWS guidelines implemented *See Row Information* Yes  Treat  MEWS Interventions Administered prn meds/treatments  Facial Expression 2  Body Movements 2  Muscle Tension 1  Take Vital Signs  Increase Vital Sign Frequency  Yellow: Q 2hr X 2 then Q 4hr X 2, if remains yellow, continue Q 4hrs  Escalate  MEWS: Escalate Yellow: discuss with charge nurse/RN and consider discussing with provider and RRT  Notify: Charge Nurse/RN  Name of Charge Nurse/RN Notified Helen Hashimoto RN  Date Charge Nurse/RN Notified 02/04/20  Time Charge Nurse/RN Notified [id number]  Document  Patient Outcome Other (Comment) (evaluate response to pain medication)  Progress note created (see row info) Yes

## 2020-02-04 NOTE — Anesthesia Preprocedure Evaluation (Signed)
Anesthesia Evaluation  Patient identified by MRN, date of birth, ID band Patient awake    Reviewed: Allergy & Precautions, NPO status , Patient's Chart, lab work & pertinent test results  History of Anesthesia Complications Negative for: history of anesthetic complications  Airway Mallampati: II  TM Distance: >3 FB Neck ROM: Full    Dental  (+) Poor Dentition, Dental Advidsory Given   Pulmonary COPD, Current Smoker and Patient abstained from smoking.,    breath sounds clear to auscultation- rhonchi (-) wheezing      Cardiovascular hypertension, Pt. on medications (-) CAD, (-) Past MI, (-) Cardiac Stents and (-) CABG  Rhythm:Regular Rate:Normal - Systolic murmurs and - Diastolic murmurs    Neuro/Psych neg Seizures PSYCHIATRIC DISORDERS Anxiety Depression negative neurological ROS     GI/Hepatic GERD  ,(+) Cirrhosis       , Hepatitis -  Endo/Other  negative endocrine ROSneg diabetes  Renal/GU negative Renal ROS     Musculoskeletal negative musculoskeletal ROS (+)   Abdominal (+) - obese,   Peds  Hematology  (+) Blood dyscrasia, anemia ,   Anesthesia Other Findings Past Medical History: No date: Alcohol abuse No date: Anxiety No date: Back injury No date: Cervical cancer ([facility]) 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 No date: COPD (chronic obstructive pulmonary disease) ([facility]) No date: Family history of adverse reaction to anesthesia     Comment:  PONV No date: GERD (gastroesophageal reflux disease) No date: Hepatitis No date: Hypertension No date: Hypokalemia 06/26/2019: IDA (iron deficiency anemia) No date: Iron deficiency anemia No date: Leg injury No date: Liver cirrhosis ([place]) No date: Pneumonia 07/10/2019: Sepsis ([place]) 06/26/2019: Symptomatic anemia No date: Thrombocytopenia ([facility])   Reproductive/Obstetrics negative OB ROS                             Anesthesia  Physical  Anesthesia Plan  ASA: III  Anesthesia Plan: General   Post-op Pain Management:    Induction: Intravenous  PONV Risk Score and Plan: 1 and Dexamethasone, Ondansetron and Treatment may vary due to age or medical condition  Airway Management Planned: Oral ETT  Additional Equipment:   Intra-op Plan:   Post-operative Plan: Extubation in OR  Informed Consent: I have reviewed the patients History and Physical, chart, labs and discussed the procedure including the risks, benefits and alternatives for the proposed anesthesia with the patient or authorized representative who has indicated his/her understanding and acceptance.     Dental advisory given  Plan Discussed with: CRNA and Anesthesiologist  Anesthesia Plan Comments:         Anesthesia Quick Evaluation

## 2020-02-04 NOTE — Op Note (Signed)
02/04/2020  10:34 AM  PATIENT:  Rhonda Martin  44 y.o. female  PRE-OPERATIVE DIAGNOSIS:  infected total right knee with ruptured patellar tendon  POST-OPERATIVE DIAGNOSIS: Same  PROCEDURE:  Procedure(s): TOTAL KNEE REVISION (Right) IRRIGATION AND DEBRIDEMENT KNEE (Right)  SURGEON: Laurene Footman, MD  ASSISTANTS: None  ANESTHESIA:   general  EBL:  Total I/O In: 700 [I.V.:700] Out: -   BLOOD ADMINISTERED:none  DRAINS: Incisional wound VAC   LOCAL MEDICATIONS USED:  NONE  SPECIMEN:  Source of Specimen:  Cultures of knee fluid  DISPOSITION OF SPECIMEN:  Microbiology  COUNTS:  YES  TOURNIQUET:  * Missing tourniquet times found for documented tourniquets in log: [id number] *  IMPLANTS: Stimulant beads with vancomycin  DICTATION: .Dragon Dictation patient was brought the operating room and after adequate anesthesia was obtained the right leg was prepped and draped in the usual sterile fashion.  After patient identification and timeout procedures were completed the prior incision was extended proximally and distally and initial cultures obtained.  The wound had dehisced such that the femoral condyle on the lateral side was visible.  The patellar tendon had completely ruptured and there is no retaining remaining patellar tendon intact.  The tourniquet was raised secondary to excessive bleeding from the skin.  Initially the wound was irrigated and additional cultures obtained.  The patella had completely avulsed off the tibial side and the patellar button was loose and the patella was somewhat comminuted and with the extent of the extensor mechanism injury is felt is unlikely reconstruction be possible as well patellectomy was performed in hopes of being able to get better soft tissue coverage at the close of the case.  The polyethylene insert was removed by removing the setscrew and then the polyethylene without difficulty.  The femur was then exposed and the cement bone interface disrupted  in the femoral component removed followed by removal of the cement with use of a rondure and saw.  The tibial component was removed in a similar fashion with the cement down into the canal removed without difficulty secondary to a slight fracture posteriorly.  At this point the Versajet pulse irrigation was used to thoroughly debride the bone and the lining of the knee as well as debridement with use of rongeurs to remove synovitis.  After this had been completed the wound was irrigated with a dilute Betadine solution and note that was allowed to set for 5 minutes after thorough irrigation with dilute Betadine solution the knee was irrigated with saline and then pulse lavage.  Stimulant beads had been mixed with vancomycin and these were placed inside the joint and into the tibial defect where the stem had been for increased postop antibiotic dosage.  The capsule was repaired using heavy Quill followed by subcutaneous suture and a horizontal mattress 2-0 nylon suture.  Incisional wound VAC was applied followed by bias wrap for padding and then the knee immobilizer locked in knee extension  PLAN OF CARE: Admit to inpatient   PATIENT DISPOSITION:  PACU - hemodynamically stable.

## 2020-02-04 NOTE — Anesthesia Procedure Notes (Signed)
Procedure Name: Intubation Date/Time: 02/04/2020 9:00 AM Performed by: Chanetta Marshall, CRNA Pre-anesthesia Checklist: Patient identified, Emergency Drugs available, Suction available and Patient being monitored Patient Re-evaluated:Patient Re-evaluated prior to induction Oxygen Delivery Method: Circle system utilized Preoxygenation: Pre-oxygenation with 100% oxygen Induction Type: IV induction Ventilation: Mask ventilation without difficulty Laryngoscope Size: McGraph and 3 Grade View: Grade I Tube type: Oral Number of attempts: 1 Airway Equipment and Method: Stylet and Video-laryngoscopy Placement Confirmation: ETT inserted through vocal cords under direct vision,  positive ETCO2 and breath sounds checked- equal and bilateral Secured at: 20 cm Tube secured with: Tape Dental Injury: Teeth and Oropharynx as per pre-operative assessment

## 2020-02-04 NOTE — Transfer of Care (Signed)
Immediate Anesthesia Transfer of Care Note  Patient: Rhonda Martin  Procedure(s) Performed: TOTAL KNEE REVISION (Right Knee) IRRIGATION AND DEBRIDEMENT KNEE (Right Knee)  Patient Location: PACU  Anesthesia Type:General  Level of Consciousness: awake and alert   Airway & Oxygen Therapy: Patient Spontanous Breathing  Post-op Assessment: Report given to RN and Post -op Vital signs reviewed and stable  Post vital signs: Reviewed and stable  Last Vitals:  Vitals Value Taken Time  BP    Temp    Pulse    Resp    SpO2      Last Pain:  Vitals:   02/04/20 0734  TempSrc: Oral  PainSc:          Complications: No complications documented.

## 2020-02-04 NOTE — Progress Notes (Signed)
Pharmacy Antibiotic Note  Rhonda Martin is a 44 y.o. female admitted on 02/03/2020 with sepsis.  Pharmacy has been consulted for Vanc, Zosyn dosing. Pt had vanc beads placed in wound on 8/29 AM ;  MD felt pt was severely septic on admission so wants to resume vanc/zosyn until cultures return.   CrCl = 78.25ml/min  Plan: Zosyn 3.375g IV q8h (4 hour infusion).   Vancomycin 1500 mg IV X 1 ordered for 8/29 @ 1800.  Vancomycin 750 mg IV Q12H ordered to start on 8/30 @ ~ 0600. No peak or trough currently ordered.   Height: 5\' 2"  (157.5 cm) Weight: 63.5 kg (140 lb) IBW/kg (Calculated) : 50.1  Temp (24hrs), Avg:97.9 F (36.6 C), Min:97.1 F (36.2 C), Max:98.5 F (36.9 C)  Recent Labs  Lab 02/03/20 0944 02/03/20 0951 02/04/20 1646  WBC  --  5.3 6.3  CREATININE  --  0.47  --   LATICACIDVEN 1.8  --   --     Estimated Creatinine Clearance: 78.6 mL/min (by C-G formula based on SCr of 0.47 mg/dL).    Allergies  Allergen Reactions  . Tylenol [Acetaminophen] Other (See Comments)    Liver disease    Antimicrobials this admission:   >>    >>   Dose adjustments this admission:   Microbiology results:  BCx:   UCx:    Sputum:    MRSA PCR:   Thank you for allowing pharmacy to be a part of this patient's care.  Bear Osten D 02/04/2020 5:19 PM

## 2020-02-04 NOTE — Progress Notes (Signed)
PROGRESS NOTE  BERNETHA ANSCHUTZ ZOX:[id number] DOB: [birth year] DOA: 02/03/2020 PCP: Volney American, PA-C  HPI/Recap of past 67 hours: 44 year old female with past medical history of polysubstance abuse including alcohol, secondary hepatitis C and cirrhosis who underwent a total knee done on 8/5.  Recommendation was for discharge to skilled nursing at that time, but patient preferred to go home and was discharged on 8/10.  Seen on 8/18 for postop visit at that time noted to have mild drainage but incision intact.  Presented to the emergency room on 8/22 due to bleeding from the incision which had opened at this point and patient left AMA.  Evaluated by orthopedic surgery on 8/24 at this time dehiscence more extensive.  Wound VAC set up for patient and was placed on 8/24, but apparently patient had removed wound VAC on 8/26.  Per patient's husband, patient became more confused and at some point had remove the wound VAC.  Brought patient to the emergency room when she was noted to have significant bloody drainage.  The emergency room, noted to to be tachycardic, tachypneic with significant wound dehiscence and drainage.  Patient felt to be septic and started on antibiotics and admitted to the hospitalist service.  Orthopedic surgery consulted and took patient to the operating room on morning of 8/29.  Patella found to be completely ruptured and avulsed off of tibial side.  Bone thoroughly debrided as was lining of knee.  Vancomycin beads placed in joint.  Capsule repaired.  Knee immobilizer placed.  Following transfer to floor, patient somewhat confused, restraints and sitter used.  Patient seen in recovery, prior to transfer to floor.  Somnolent.  Assessment/Plan: Principal Problem: Patient with history of polysubstance abuse including alcohol, chronic hepatitis C and liver cirrhosis: Rechecking ammonia level in the morning.  Monitoring for alcohol withdrawal on CIWA protocol.      Sepsis [facility]):  Patient is criteria for sepsis on admission secondary to knee wound dehiscence, as evidenced by SIRS criteria and wound source.  Continue broad-spectrum antibiotics plus vancomycin beads placed in wound.  Knee immobilizer in place.  Orthopedics following.    COPD (chronic obstructive pulmonary disease) ([place]): Stable at this time.   HTN (hypertension): Blood pressure stable at this time.   Code Status: Full code  Family Communication: Updated husband by phone  Disposition Plan: Anticipate eventual discharge to SNF.  Patient needs further surgical debridement depending on wound healing.   Consultants:  Orthopedic surgery  Procedures:  Debridement of the wound with placement of antibiotic beads done 8/29  Antimicrobials:  Vancomycin beads placed 8/29  IV vancomycin/Zosyn 8/28-current  DVT prophylaxis: SCDs   Objective: Vitals:   02/04/20 1536 02/04/20 1641  BP: (!) 145/95 139/89  Pulse: (!) 115 (!) 116  Resp: 20 20  Temp: 98.2 F (36.8 C) 98.5 F (36.9 C)  SpO2: 94% 97%    Intake/Output Summary (Last 24 hours) at 02/04/2020 1655 Last data filed at 02/04/2020 1515 Gross per 24 hour  Intake 1927.46 ml  Output 375 ml  Net 1552.46 ml   Filed Weights   02/03/20 0939  Weight: 63.5 kg   Body mass index is 25.61 kg/m.  Exam:   General: Drowsy, confused  HEENT, respiratory atraumatic, mucous membranes are dry  Cardiovascular: Regular rate and rhythm, S1-S2, borderline tachycardia  Respiratory: Clear to auscultation bilaterally  Abdomen: Soft, nondistended, hypoactive bowel sounds  Musculoskeletal: Right knee immobilized, trace pitting edema  Skin: The wound is currently dressed, immobilized  Psychiatry: Acutely confused  Data Reviewed: CBC: Recent Labs  Lab 02/03/20 0951  WBC 5.3  NEUTROABS 3.9  HGB 8.6*  HCT 26.4*  MCV 82.8  PLT 235   Basic Metabolic Panel: Recent Labs  Lab 02/03/20 0951  NA 133*  K 3.8  CL 98  CO2 24  GLUCOSE 132*   BUN 6  CREATININE 0.47  CALCIUM 8.4*  MG 1.7   GFR: Estimated Creatinine Clearance: 78.6 mL/min (by C-G formula based on SCr of 0.47 mg/dL). Liver Function Tests: Recent Labs  Lab 02/03/20 0951  AST 39  ALT 20  ALKPHOS 106  BILITOT 1.4*  PROT 7.1  ALBUMIN 2.9*   No results for input(s): LIPASE, AMYLASE in the last 168 hours. Recent Labs  Lab 02/03/20 1511  AMMONIA 31   Coagulation Profile: No results for input(s): INR, PROTIME in the last 168 hours. Cardiac Enzymes: No results for input(s): CKTOTAL, CKMB, CKMBINDEX, TROPONINI in the last 168 hours. BNP (last 3 results) No results for input(s): PROBNP in the last 8760 hours. HbA1C: No results for input(s): HGBA1C in the last 72 hours. CBG: No results for input(s): GLUCAP in the last 168 hours. Lipid Profile: No results for input(s): CHOL, HDL, LDLCALC, TRIG, CHOLHDL, LDLDIRECT in the last 72 hours. Thyroid Function Tests: No results for input(s): TSH, T4TOTAL, FREET4, T3FREE, THYROIDAB in the last 72 hours. Anemia Panel: No results for input(s): VITAMINB12, FOLATE, FERRITIN, TIBC, IRON, RETICCTPCT in the last 72 hours. Urine analysis:    Component Value Date/Time   COLORURINE YELLOW (A) 01/14/2020 0827   APPEARANCEUR HAZY (A) 01/14/2020 0827   APPEARANCEUR Clear 10/09/2019 1448   LABSPEC 1.013 01/14/2020 0827   PHURINE 6.0 01/14/2020 0827   GLUCOSEU NEGATIVE 01/14/2020 0827   HGBUR NEGATIVE 01/14/2020 0827   BILIRUBINUR NEGATIVE 01/14/2020 0827   BILIRUBINUR Negative 10/09/2019 1448   KETONESUR NEGATIVE 01/14/2020 0827   PROTEINUR NEGATIVE 01/14/2020 0827   NITRITE NEGATIVE 01/14/2020 0827   LEUKOCYTESUR MODERATE (A) 01/14/2020 0827   Sepsis Labs: @LABRCNTIP (procalcitonin:4,lacticidven:4)  ) Recent Results (from the past 240 hour(s))  Blood culture (routine x 2)     Status: None (Preliminary result)   Collection Time: 02/03/20  9:48 AM   Specimen: BLOOD  Result Value Ref Range Status   Specimen  Description BLOOD RFA  Final   Special Requests   Final    BOTTLES DRAWN AEROBIC AND ANAEROBIC Blood Culture adequate volume   Culture   Final    NO GROWTH < 24 HOURS Performed at [facility], [address], [place], [place] [address]    Report Status PENDING  Incomplete  Blood culture (routine x 2)     Status: None (Preliminary result)   Collection Time: 02/03/20  9:50 AM   Specimen: BLOOD  Result Value Ref Range Status   Specimen Description BLOOD RT UPPER ARM  Final   Special Requests   Final    BOTTLES DRAWN AEROBIC AND ANAEROBIC Blood Culture results may not be optimal due to an inadequate volume of blood received in culture bottles   Culture   Final    NO GROWTH < 24 HOURS Performed at [facility], [address] [place], [place] [address]    Report Status PENDING  Incomplete  SARS Coronavirus 2 by RT PCR (hospital order, performed in [place] hospital lab) Nasopharyngeal Nasopharyngeal Swab     Status: None   Collection Time: 02/03/20  9:51 AM   Specimen: Nasopharyngeal Swab  Result Value Ref Range Status  SARS Coronavirus 2 NEGATIVE NEGATIVE Final    Comment: (NOTE) SARS-CoV-2 target nucleic acids are NOT DETECTED.  The SARS-CoV-2 RNA is generally detectable in upper and lower respiratory specimens during the acute phase of infection. The lowest concentration of SARS-CoV-2 viral copies this assay can detect is 250 copies / mL. A negative result does not preclude SARS-CoV-2 infection and should not be used as the sole basis for treatment or other patient management decisions.  A negative result may occur with improper specimen collection / handling, submission of specimen other than nasopharyngeal swab, presence of viral mutation(s) within the areas targeted by this assay, and inadequate number of viral copies (<250 copies / mL). A negative result must be combined with clinical observations, patient history, and epidemiological  information.  Fact Sheet for Patients:   StrictlyIdeas.no  Fact Sheet for Healthcare Providers: BankingDealers.co.za  This test is not yet approved or  cleared by the [place] FDA and has been authorized for detection and/or diagnosis of SARS-CoV-2 by FDA under an Emergency Use Authorization (EUA).  This EUA will remain in effect (meaning this test can be used) for the duration of the COVID-19 declaration under Section 564(b)(1) of the Act, 21 U.S.C. section 360bbb-3(b)(1), unless the authorization is terminated or revoked sooner.  Performed at [facility], [address] [place], [place] [address]       Studies: DG Knee 1-2 Views Right  Result Date: 02/04/2020 CLINICAL DATA:  Postop pain EXAM: RIGHT KNEE - 1-2 VIEW COMPARISON:  February 03, 2020 FINDINGS: The tibial and femoral hardware from the previous knee replacement is been removed. Antibiotic beads have been placed into the cavities left by the removed hardware. Large joint effusion. Apparent fracture fragment adjacent to the lateral aspect of the proximal tibia. Apparent fracture line extending through the proximal tibia. IMPRESSION: 1. Removal of hardware with antibiotic beads placed. 2. Apparent fracture fragment adjacent to the lateral aspect of the proximal tibia. Subtle fracture line extending through the tibial metaphysis. Electronically Signed   By: Dorise Bullion III M.D   On: 02/04/2020 13:56    Scheduled Meds: . dicyclomine  20 mg Oral TID AC & HS  . docusate sodium  100 mg Oral BID  . DULoxetine  20 mg Oral Daily  . fentaNYL      . ferrous sulfate  325 mg Oral Q breakfast  . folic acid  1 mg Oral Daily  . furosemide  20 mg Oral Daily  . HYDROmorphone      . HYDROmorphone      . lactulose  30 g Oral BID  . magnesium oxide  400 mg Oral Daily  . metoprolol succinate  12.5 mg Oral Daily  . midazolam      . multivitamin with minerals  1 tablet Oral  Daily  . OLANZapine  10 mg Oral QHS  . pantoprazole  40 mg Oral Daily  . senna  1 tablet Oral BID  . spironolactone  25 mg Oral Daily  . sucralfate  1 g Oral TID WC & HS  . thiamine  100 mg Oral Daily   Or  . thiamine  100 mg Intravenous Daily  . vitamin B-12  500 mcg Oral Daily    Continuous Infusions: . sodium chloride 75 mL/hr at 02/04/20 1500     LOS: 1 day     Annita Brod, MD [facility]   02/04/2020, 4:55 PM

## 2020-02-04 NOTE — Progress Notes (Signed)
PT Cancellation Note  Patient Details Name: Rhonda Martin MRN: [id number] DOB: March 16, 1976   Cancelled Treatment:    Reason Eval/Treat Not Completed: Patient at procedure or test/unavailable   Alanson Puls, PT DPT 02/04/2020, 10:28 AM

## 2020-02-04 NOTE — Progress Notes (Signed)
No available 1:1 sitters. Initiated Oncologist.

## 2020-02-05 ENCOUNTER — Encounter: Payer: Self-pay | Admitting: Orthopedic Surgery

## 2020-02-05 LAB — COMPREHENSIVE METABOLIC PANEL
ALT: 18 U/L (ref 0–44)
AST: 33 U/L (ref 15–41)
Albumin: 2.4 g/dL — ABNORMAL LOW (ref 3.5–5.0)
Alkaline Phosphatase: 87 U/L (ref 38–126)
Anion gap: 5 (ref 5–15)
BUN: 9 mg/dL (ref 6–20)
CO2: 25 mmol/L (ref 22–32)
Calcium: 8.4 mg/dL — ABNORMAL LOW (ref 8.9–10.3)
Chloride: 107 mmol/L (ref 98–111)
Creatinine, Ser: 0.46 mg/dL (ref 0.44–1.00)
GFR calc Af Amer: >60 mL/min (ref >60)
GFR calc non Af Amer: >60 mL/min (ref >60)
Glucose, Bld: 153 mg/dL — ABNORMAL HIGH (ref 70–99)
Potassium: 3.7 mmol/L (ref 3.5–5.1)
Sodium: 137 mmol/L (ref 135–145)
Total Bilirubin: 1.2 mg/dL (ref 0.3–1.2)
Total Protein: 6.2 g/dL — ABNORMAL LOW (ref 6.5–8.1)

## 2020-02-05 LAB — AMMONIA: Ammonia: 57 umol/L — ABNORMAL HIGH (ref 9–35)

## 2020-02-05 LAB — CBC
HCT: 21.8 % — ABNORMAL LOW (ref 36.0–46.0)
Hemoglobin: 7 g/dL — ABNORMAL LOW (ref 12.0–15.0)
MCH: 26.7 pg (ref 26.0–34.0)
MCHC: 32.1 g/dL (ref 30.0–36.0)
MCV: 83.2 fL (ref 80.0–100.0)
Platelets: 139 10*3/uL — ABNORMAL LOW (ref 150–400)
RBC: 2.62 MIL/uL — ABNORMAL LOW (ref 3.87–5.11)
RDW: 18.9 % — ABNORMAL HIGH (ref 11.5–15.5)
WBC: 5.2 10*3/uL (ref 4.0–10.5)
nRBC: 0 % (ref 0.0–0.2)

## 2020-02-05 MED ORDER — IPRATROPIUM-ALBUTEROL 0.5-2.5 (3) MG/3ML IN SOLN: 3.0000 mL | RESPIRATORY_TRACT | Status: DC | PRN

## 2020-02-05 MED ORDER — DM-GUAIFENESIN ER 30-600 MG PO TB12: 1.0000 | ORAL_TABLET | Freq: Two times a day (BID) | ORAL | Status: DC | PRN

## 2020-02-05 MED ORDER — MORPHINE SULFATE (PF) 2 MG/ML IV SOLN
1.0000 mg | INTRAVENOUS | Status: DC | PRN
  Administered 2020-02-05 (×2): 1 mg via INTRAVENOUS
  Filled 2020-02-05 (×2): qty 1

## 2020-02-05 MED ORDER — LACTULOSE 10 GM/15ML PO SOLN
30.0000 g | Freq: Three times a day (TID) | ORAL | Status: DC
  Administered 2020-02-05 – 2020-02-06 (×4): 30 g via ORAL
  Filled 2020-02-05 (×8): qty 60

## 2020-02-05 MED ORDER — OXYCODONE HCL 5 MG PO TABS
5.0000 mg | ORAL_TABLET | ORAL | Status: DC | PRN
  Administered 2020-02-06: 5 mg via ORAL
  Filled 2020-02-05: qty 1

## 2020-02-05 MED ORDER — MAGNESIUM SULFATE 2 GM/50ML IV SOLN
2.0000 g | Freq: Once | INTRAVENOUS | Status: AC
  Administered 2020-02-06: 2 g via INTRAVENOUS
  Filled 2020-02-05: qty 50

## 2020-02-05 MED ORDER — SODIUM CHLORIDE 0.9 % IV SOLN
2.0000 g | Freq: Three times a day (TID) | INTRAVENOUS | Status: DC
  Administered 2020-02-05 – 2020-02-08 (×8): 2 g via INTRAVENOUS
  Filled 2020-02-05 (×12): qty 2

## 2020-02-05 MED ORDER — OXYCODONE HCL ER 10 MG PO T12A
10.0000 mg | EXTENDED_RELEASE_TABLET | Freq: Two times a day (BID) | ORAL | Status: DC
  Administered 2020-02-05 – 2020-02-09 (×9): 10 mg via ORAL
  Filled 2020-02-05 (×9): qty 1

## 2020-02-05 MED FILL — Acetaminophen Tab 325 MG: ORAL | Qty: 650 | Status: AC

## 2020-02-05 MED FILL — Lorazepam Inj 2 MG/ML: INTRAMUSCULAR | Qty: 0.5 | Status: AC

## 2020-02-05 MED FILL — Potassium Chloride Microencapsulated Crys ER Tab 20 mEq: ORAL | Qty: 20 | Status: AC

## 2020-02-05 MED FILL — Ondansetron HCl Inj 4 MG/2ML (2 MG/ML): INTRAMUSCULAR | Qty: 2 | Status: AC

## 2020-02-05 MED FILL — Oxycodone HCl Tab 5 MG: ORAL | Qty: 5 | Status: AC

## 2020-02-05 MED FILL — Morphine Sulfate IV Soln PF 2 MG/ML: INTRAVENOUS | Qty: 0.5 | Status: AC

## 2020-02-05 NOTE — Progress Notes (Signed)
PT Cancellation Note  Patient Details Name: Rhonda Martin MRN: [id number] DOB: Aug 06, 1975   Cancelled Treatment:    Reason Eval/Treat Not Completed: Other (comment).  PT consult received.  Chart reviewed.  Upon therapist initially entering pt's room, pt c/o 8/10 R LE pain and refusing PT and requesting pain medication.  Nurse notified and brought pt pain medication and therapist then came to see pt once pt's pain status improved.  Pt's knee immobilizer straps noted to be extremely loose so therapist adjusted/tightened straps as needed/as appropriate to improve fit.  Attempted to get pt to participate in therapy but pt appearing very confused (pt oriented to person and a little of situation but not place or time) and pt refusing to participate in any therapy activity (other than performing a few small ankle pumps on her own).  Pt reporting that she would do ex's later on her own and would get up later but would not work with therapy at this time (pt reporting prior poor experience with physical therapy so she did not want to work with physical therapy).  Therapist kept trying different strategies to attempt to get pt to participate in therapy but was unsuccessful.  Pt then pulling on SCD cords and stating she was looking for water (therapist handed pt her cup of water); then pt pulling on bed rail and said she has been asking all day what that was and appeared satisfied when therapist educated her that it was a bed side rail.  Nurse notified of pt's confusion and unable to get pt to work with therapy.  Will re-attempt PT evaluation tomorrow.  Leitha Bleak, PT 02/05/20, 4:59 PM

## 2020-02-05 NOTE — Plan of Care (Signed)
Pt continues to be disoriented to situation. Ativan given per CIWA protocol. Telesitter in room for safety. Problem: Clinical Measurements: Goal: Ability to maintain clinical measurements within normal limits will improve Outcome: Progressing Goal: Will remain free from infection Outcome: Progressing Goal: Respiratory complications will improve Outcome: Progressing   Problem: Activity: Goal: Risk for activity intolerance will decrease Outcome: Progressing   Problem: Nutrition: Goal: Adequate nutrition will be maintained Outcome: Progressing   Problem: Coping: Goal: Level of anxiety will decrease Outcome: Progressing   Problem: Elimination: Goal: Will not experience complications related to bowel motility Outcome: Progressing Goal: Will not experience complications related to urinary retention Outcome: Progressing   Problem: Pain Managment: Goal: General experience of comfort will improve Outcome: Progressing   Problem: Safety: Goal: Ability to remain free from injury will improve Outcome: Progressing   Problem: Skin Integrity: Goal: Risk for impaired skin integrity will decrease Outcome: Progressing   Problem: Education: Goal: Knowledge of General Education information will improve Description: Including pain rating scale, medication(s)/side effects and non-pharmacologic comfort measures Outcome: Not Progressing   Problem: Health Behavior/Discharge Planning: Goal: Ability to manage health-related needs will improve Outcome: Not Progressing

## 2020-02-05 NOTE — Progress Notes (Signed)
Pt is currently sleeping,Easily arousable but lethargic. Will try to give po meds later. Pt is resting comfortably.

## 2020-02-05 NOTE — Progress Notes (Signed)
PROGRESS NOTE    Rhonda Martin  NKN:[id number] DOB: [birth year] DOA: 02/03/2020 PCP: Volney American, PA-C   Brief Narrative:  44 year old with history of polysubstance abuse, alcohol use, hepatitis C with cirrhosis underwentRight total knee arthroplasty 3 weeks prior to admissionWith poor follow-up presented to the hospital a week prior to admission for drainage from the incision site on 8/22.  He ended up leaving AMA.  And followed up outpatient with orthopedic office and wound VAC was placed on 8/24 which apparently patient removed on 8/26.  Now coming to the ER with signs of sepsis and worsening of wound infection.  Patient was taken to the OR on 8/29 and found to have completely ruptured patella.  Vancomycin beads were placed, capsule was repaired along with knee immobilizer.   Assessment & Plan:   Principal Problem:   Sinus tachycardia Active Problems:   Chronic hepatitis C without hepatic coma (HCC)   Alcohol abuse   Tobacco abuse   Alcohol withdrawal delirium (HCC)   Knee pain, right   Sepsis (HCC)   COPD (chronic obstructive pulmonary disease) (HCC)   HTN (hypertension)   Hepatic encephalopathy (HCC)   S/P TKR (total knee replacement) using cement, right   Wound dehiscence, surgical, initial encounter   Sepsis secondary to right knee wound infection after recent right knee total arthroplasty Medical noncompliance -Sepsis physiology is improving but still sinus tachycardia -Status post surgery by orthopedic on 8/29-infected right knee with ruptured patellar tendon status post knee revision, irrigation and debridement. Wound Vac in place. -Blood cultures-negative to date -Wound cultures-pending -Antibiotics-vancomycin and Zosyn.  These will be tailored once we are able to follow-up culture data.  Wondering if we need to add rifampin. -Consulted infectious disease for their input- Dr Ola Spurr. Added long acting oxycontin, reduce IV morphine dose and continue  Oxycodone IR  History of polysubstance abuse/alcohol use/hepatitis C Liver cirrhosis -Currently on lactulose twice daily.  Advised to quit drinking.  Supportive care.  Should follow-up outpatient with PCP.  Unsure of his previous hepatitis C treatment -On Lasix and Aldactone -Folic acid, multivitamin and thiamine -Elevated ammonia, lactulose 3 times daily.  History of COPD -As needed bronchodilators  Essential hypertension -Lasix 20 mg daily, Toprol-XL 12.5 mg daily, Aldactone   DVT prophylaxis: Foot Pump / plexipulse Start: 02/04/20 1408 SCDs Start: 02/03/20 1424  Code Status: Full code Family Communication:    Status is: Inpatient  Remains inpatient appropriate because:Hemodynamically unstable   Dispo: The patient is from: Home              Anticipated d/c is to: Home              Anticipated d/c date is: 2 days              Patient currently is not medically stable to d/c. Complaining of Knee pain, on going treatment of Infection with IV Abx.    Body mass index is 25.61 kg/m.  Subjective: Feels ok besides right knee pain.   Review of Systems Otherwise negative except as per HPI, including: General: Denies fever, chills, night sweats or unintended weight loss. Resp: Denies cough, wheezing, shortness of breath. Cardiac: Denies chest pain, palpitations, orthopnea, paroxysmal nocturnal dyspnea. GI: Denies abdominal pain, nausea, vomiting, diarrhea or constipation GU: Denies dysuria, frequency, hesitancy or incontinence MS: Denies joint swelling.  Neuro: Denies headache, neurologic deficits (focal weakness, numbness, tingling), abnormal gait Psych: Denies anxiety, depression, SI/HI/AVH Skin: Denies new rashes or lesions ID: Denies sick contacts, exotic  exposures, travel  Examination:  General exam: Appears calm and comfortable  Respiratory system: Clear to auscultation. Respiratory effort normal. Cardiovascular system: S1 & S2 heard, RRR. No JVD, murmurs, rubs,  gallops or clicks. No pedal edema. Gastrointestinal system: Abdomen is nondistended, soft and nontender. No organomegaly or masses felt. Normal bowel sounds heard. Central nervous system: Alert and oriented. No focal neurological deficits. Extremities: Symmetric 5 x 5 power. Skin: Right knee immobilizer in place.  Psychiatry: Judgement and insight appear normal. Mood & affect appropriate.   Objective: Vitals:   02/04/20 1728 02/04/20 1932 02/04/20 2357 02/05/20 0426  BP: (!) 142/90 137/80 113/62 129/78  Pulse: (!) 116 (!) 121 (!) 121 (!) 114  Resp: 18 16 16 16   Temp: 98.8 F (37.1 C) 99.5 F (37.5 C) 99 F (37.2 C) (!) 97.5 F (36.4 C)  TempSrc: Oral Oral Oral Oral  SpO2: 96% 96% 92% 96%  Weight:      Height:        Intake/Output Summary (Last 24 hours) at 02/05/2020 0822 Last data filed at 02/04/2020 1515 Gross per 24 hour  Intake 1020 ml  Output 375 ml  Net 645 ml   Filed Weights   02/03/20 0939  Weight: 63.5 kg   Data Reviewed:   CBC: Recent Labs  Lab 02/03/20 0951 02/04/20 1646 02/05/20 0450  WBC 5.3 6.3 5.2  NEUTROABS 3.9  --   --   HGB 8.6* 8.3* 7.0*  HCT 26.4* 26.3* 21.8*  MCV 82.8 84.0 83.2  PLT 173 155 725*   Basic Metabolic Panel: Recent Labs  Lab 02/03/20 0951 02/04/20 1646 02/05/20 0450  NA 133*  --  137  K 3.8  --  3.7  CL 98  --  107  CO2 24  --  25  GLUCOSE 132*  --  153*  BUN 6  --  9  CREATININE 0.47  --  0.46  CALCIUM 8.4*  --  8.4*  MG 1.7 1.6*  --   PHOS  --  4.4  --    GFR: Estimated Creatinine Clearance: 78.6 mL/min (by C-G formula based on SCr of 0.46 mg/dL). Liver Function Tests: Recent Labs  Lab 02/03/20 0951 02/05/20 0450  AST 39 33  ALT 20 18  ALKPHOS 106 87  BILITOT 1.4* 1.2  PROT 7.1 6.2*  ALBUMIN 2.9* 2.4*   No results for input(s): LIPASE, AMYLASE in the last 168 hours. Recent Labs  Lab 02/03/20 1511 02/05/20 0450  AMMONIA 31 57*   Coagulation Profile: No results for input(s): INR, PROTIME in the  last 168 hours. Cardiac Enzymes: No results for input(s): CKTOTAL, CKMB, CKMBINDEX, TROPONINI in the last 168 hours. BNP (last 3 results) No results for input(s): PROBNP in the last 8760 hours. HbA1C: No results for input(s): HGBA1C in the last 72 hours. CBG: No results for input(s): GLUCAP in the last 168 hours. Lipid Profile: No results for input(s): CHOL, HDL, LDLCALC, TRIG, CHOLHDL, LDLDIRECT in the last 72 hours. Thyroid Function Tests: No results for input(s): TSH, T4TOTAL, FREET4, T3FREE, THYROIDAB in the last 72 hours. Anemia Panel: No results for input(s): VITAMINB12, FOLATE, FERRITIN, TIBC, IRON, RETICCTPCT in the last 72 hours. Sepsis Labs: Recent Labs  Lab 02/03/20 0944  LATICACIDVEN 1.8    Recent Results (from the past 240 hour(s))  Blood culture (routine x 2)     Status: None (Preliminary result)   Collection Time: 02/03/20  9:48 AM   Specimen: BLOOD  Result Value Ref Range Status  Specimen Description BLOOD RFA  Final   Special Requests   Final    BOTTLES DRAWN AEROBIC AND ANAEROBIC Blood Culture adequate volume   Culture   Final    NO GROWTH 2 DAYS Performed at [facility], [address] [place], [place] [address]    Report Status PENDING  Incomplete  Blood culture (routine x 2)     Status: None (Preliminary result)   Collection Time: 02/03/20  9:50 AM   Specimen: BLOOD  Result Value Ref Range Status   Specimen Description BLOOD RT UPPER ARM  Final   Special Requests   Final    BOTTLES DRAWN AEROBIC AND ANAEROBIC Blood Culture results may not be optimal due to an inadequate volume of blood received in culture bottles   Culture   Final    NO GROWTH 2 DAYS Performed at [facility], [address], [place], [place] [address]    Report Status PENDING  Incomplete  SARS Coronavirus 2 by RT PCR (hospital order, performed in [place] hospital lab) Nasopharyngeal Nasopharyngeal Swab     Status: None   Collection Time: 02/03/20  9:51  AM   Specimen: Nasopharyngeal Swab  Result Value Ref Range Status   SARS Coronavirus 2 NEGATIVE NEGATIVE Final    Comment: (NOTE) SARS-CoV-2 target nucleic acids are NOT DETECTED.  The SARS-CoV-2 RNA is generally detectable in upper and lower respiratory specimens during the acute phase of infection. The lowest concentration of SARS-CoV-2 viral copies this assay can detect is 250 copies / mL. A negative result does not preclude SARS-CoV-2 infection and should not be used as the sole basis for treatment or other patient management decisions.  A negative result may occur with improper specimen collection / handling, submission of specimen other than nasopharyngeal swab, presence of viral mutation(s) within the areas targeted by this assay, and inadequate number of viral copies (<250 copies / mL). A negative result must be combined with clinical observations, patient history, and epidemiological information.  Fact Sheet for Patients:   StrictlyIdeas.no  Fact Sheet for Healthcare Providers: BankingDealers.co.za  This test is not yet approved or  cleared by the [place] FDA and has been authorized for detection and/or diagnosis of SARS-CoV-2 by FDA under an Emergency Use Authorization (EUA).  This EUA will remain in effect (meaning this test can be used) for the duration of the COVID-19 declaration under Section 564(b)(1) of the Act, 21 U.S.C. section 360bbb-3(b)(1), unless the authorization is terminated or revoked sooner.  Performed at [facility], [address] [place], [place] [address]          Radiology Studies: DG Knee 1-2 Views Right  Result Date: 02/04/2020 CLINICAL DATA:  Postop pain EXAM: RIGHT KNEE - 1-2 VIEW COMPARISON:  February 03, 2020 FINDINGS: The tibial and femoral hardware from the previous knee replacement is been removed. Antibiotic beads have been placed into the cavities left by the removed  hardware. Large joint effusion. Apparent fracture fragment adjacent to the lateral aspect of the proximal tibia. Apparent fracture line extending through the proximal tibia. IMPRESSION: 1. Removal of hardware with antibiotic beads placed. 2. Apparent fracture fragment adjacent to the lateral aspect of the proximal tibia. Subtle fracture line extending through the tibial metaphysis. Electronically Signed   By: Dorise Bullion III M.D   On: 02/04/2020 13:56   DG Knee 1-2 Views Right  Result Date: 02/03/2020 CLINICAL DATA:  Status post right knee arthroplasty on 01/24/2020 now with surgical wound dehiscence.  EXAM: RIGHT KNEE - 1-2 VIEW COMPARISON:  01/28/2020 . FINDINGS: Postoperative changes from right total knee arthroplasty identified. The hardware components remain in anatomic alignment. No periprosthetic fracture or dislocation. There is diffuse soft tissue swelling identified. Gas is identified within the surrounding soft tissues compatible with the history of wound dehiscence IMPRESSION: 1. Status post right total knee arthroplasty. No hardware complications identified at this time. 2. Soft tissue swelling and gas compatible with history of wound dehiscence. Electronically Signed   By: Kerby Moors M.D.   On: 02/03/2020 11:18   Scheduled Meds:  dicyclomine  20 mg Oral TID AC & HS   docusate sodium  100 mg Oral BID   DULoxetine  20 mg Oral Daily   ferrous sulfate  325 mg Oral Q breakfast   folic acid  1 mg Oral Daily   furosemide  20 mg Oral Daily   lactulose  30 g Oral BID   magnesium oxide  400 mg Oral Daily   metoprolol succinate  12.5 mg Oral Daily   multivitamin with minerals  1 tablet Oral Daily   OLANZapine  10 mg Oral QHS   pantoprazole  40 mg Oral Daily   senna  1 tablet Oral BID   spironolactone  25 mg Oral Daily   sucralfate  1 g Oral TID WC & HS   thiamine  100 mg Oral Daily   Or   thiamine  100 mg Intravenous Daily   vitamin B-12  500 mcg Oral Daily    Continuous Infusions:  sodium chloride 75 mL/hr at 02/04/20 1500   piperacillin-tazobactam (ZOSYN)  IV 3.375 g (02/05/20 0018)   vancomycin 750 mg (02/05/20 0645)     LOS: 2 days   Time spent= 35 mins    Jacobey Gura Arsenio Loader, MD [facility]  If 7PM-7AM, please contact night-coverage  02/05/2020, 8:22 AM

## 2020-02-05 NOTE — Progress Notes (Signed)
Ch attempted visit with Pt as per request previous week. Pt was asleep at this time. Ch will follow-up on her later.

## 2020-02-05 NOTE — Progress Notes (Signed)
PT Cancellation Note  Patient Details Name: Rhonda Martin MRN: [id number] DOB: 1975-06-14   Cancelled Treatment:    Reason Eval/Treat Not Completed: Pain limiting ability to participate.  PT consult received.  Chart reviewed.  Pt awake upon PT entering pt's room and c/o 8/10 R LE pain--pt refusing physical therapy d/t this pain.  Nurse notified of pt's request for pain medication.  Will re-attempt PT evaluation later today.   [name] 02/05/2020, 11:05 AM

## 2020-02-05 NOTE — Progress Notes (Signed)
Subjective: 1 Day Post-Op Procedure(s) (LRB): TOTAL KNEE REVISION (Right) IRRIGATION AND DEBRIDEMENT KNEE (Right) Patient reports pain as mild.   Patient with no complaints. Denies any CP, SOB, ABD pain. We will start therapy today.    Objective: Vital signs in last 24 hours: Temp:  [97.1 F (36.2 C)-99.5 F (37.5 C)] 97.5 F (36.4 C) (08/30 0426) Pulse Rate:  [99-121] 114 (08/30 0426) Resp:  [10-26] 16 (08/30 0426) BP: (113-156)/(62-101) 129/78 (08/30 0426) SpO2:  [91 %-100 %] 96 % (08/30 0426)  Intake/Output from previous day: 08/29 0701 - 08/30 0700 In: 1020 [P.O.:220; I.V.:800] Out: 375 [Urine:125; Drains:200; Blood:50] Intake/Output this shift: No intake/output data recorded.  Recent Labs    02/03/20 0951 02/04/20 1646 02/05/20 0450  HGB 8.6* 8.3* 7.0*   Recent Labs    02/04/20 1646 02/05/20 0450  WBC 6.3 5.2  RBC 3.13* 2.62*  HCT 26.3* 21.8*  PLT 155 139*   Recent Labs    02/03/20 0951 02/05/20 0450  NA 133* 137  K 3.8 3.7  CL 98 107  CO2 24 25  BUN 6 9  CREATININE 0.47 0.46  GLUCOSE 132* 153*  CALCIUM 8.4* 8.4*   No results for input(s): LABPT, INR in the last 72 hours.  EXAM General - Patient is Alert, Appropriate and Oriented Extremity - Neurovascular intact Sensation intact distally Intact pulses distally Dorsiflexion/Plantar flexion intact No cellulitis present Compartment soft Dressing - dressing C/D/I , prevena intact with 125 cc drainage. Motor Function - intact, moving foot and toes well on exam.   Past Medical History:  Diagnosis Date  . Alcohol abuse   . Anxiety   . Back injury   . Cervical cancer ([place])   . Charcot-Marie-Tooth disease   . COPD (chronic obstructive pulmonary disease) ([place])   . Family history of adverse reaction to anesthesia    PONV  . GERD (gastroesophageal reflux disease)   . Hepatitis    liver fibrosis, Hep C negative on 09/3019  . Hypertension   . Hypokalemia   . IDA (iron deficiency anemia)  06/26/2019  . Iron deficiency anemia   . Leg injury   . Liver cirrhosis ([place])   . Pneumonia   . Sepsis ([place]) 07/10/2019  . Symptomatic anemia 06/26/2019  . Thrombocytopenia (HCC)     Assessment/Plan:   1 Day Post-Op Procedure(s) (LRB): TOTAL KNEE REVISION (Right) IRRIGATION AND DEBRIDEMENT KNEE (Right) Principal Problem:   Sinus tachycardia Active Problems:   Chronic hepatitis C without hepatic coma (HCC)   Alcohol abuse   Tobacco abuse   Alcohol withdrawal delirium (HCC)   Knee pain, right   Sepsis (HCC)   COPD (chronic obstructive pulmonary disease) (HCC)   HTN (hypertension)   Hepatic encephalopathy (HCC)   S/P TKR (total knee replacement) using cement, right   Wound dehiscence, surgical, initial encounter   Acute post op blood loss anemia with underlying chronic anemia   Estimated body mass index is 25.61 kg/m as calculated from the following:   Height as of this encounter: 5\' 2"  (1.575 m).   Weight as of this encounter: 63.5 kg. Advance diet Up with therapy, PWB (50%) RLE with knee immobilizer on at all times Acute post op blood loss anemia with underlying chronic anemia - continue with Iron supplements.  Recheck Hgb in the am, will transfuse if Hgb <7 Continue with IV abx Cultures pending Pain well controlled  DVT Prophylaxis - TED hose and SCDs    T. Rachelle Hora, PA-C [facility]  Orthopaed[facility] 02/05/2020, 8:07 AM

## 2020-02-05 NOTE — Consult Note (Signed)
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 much for allowing me to participate in the care of this patient. Please call with questions.   Cheral Marker. Ola Spurr, MD

## 2020-02-06 LAB — CBC
HCT: 19.5 % — ABNORMAL LOW (ref 36.0–46.0)
Hemoglobin: 6.5 g/dL — ABNORMAL LOW (ref 12.0–15.0)
MCH: 27.3 pg (ref 26.0–34.0)
MCHC: 33.3 g/dL (ref 30.0–36.0)
MCV: 81.9 fL (ref 80.0–100.0)
Platelets: 129 10*3/uL — ABNORMAL LOW (ref 150–400)
RBC: 2.38 MIL/uL — ABNORMAL LOW (ref 3.87–5.11)
RDW: 19.7 % — ABNORMAL HIGH (ref 11.5–15.5)
WBC: 5.8 10*3/uL (ref 4.0–10.5)
nRBC: 0 % (ref 0.0–0.2)

## 2020-02-06 LAB — BASIC METABOLIC PANEL
Anion gap: 4 — ABNORMAL LOW (ref 5–15)
BUN: 10 mg/dL (ref 6–20)
CO2: 27 mmol/L (ref 22–32)
Calcium: 7.6 mg/dL — ABNORMAL LOW (ref 8.9–10.3)
Chloride: 109 mmol/L (ref 98–111)
Creatinine, Ser: 0.41 mg/dL — ABNORMAL LOW (ref 0.44–1.00)
GFR calc Af Amer: >60 mL/min (ref >60)
GFR calc non Af Amer: >60 mL/min (ref >60)
Glucose, Bld: 115 mg/dL — ABNORMAL HIGH (ref 70–99)
Potassium: 3.7 mmol/L (ref 3.5–5.1)
Sodium: 140 mmol/L (ref 135–145)

## 2020-02-06 LAB — GLUCOSE, CAPILLARY: Glucose-Capillary: 131 mg/dL — ABNORMAL HIGH (ref 70–99)

## 2020-02-06 LAB — MAGNESIUM: Magnesium: 1.7 mg/dL (ref 1.7–2.4)

## 2020-02-06 LAB — PREPARE RBC (CROSSMATCH)

## 2020-02-06 MED ORDER — POTASSIUM CHLORIDE CRYS ER 20 MEQ PO TBCR
40.0000 meq | EXTENDED_RELEASE_TABLET | Freq: Once | ORAL | Status: AC
  Administered 2020-02-06: 40 meq via ORAL
  Filled 2020-02-06: qty 2

## 2020-02-06 MED ORDER — SODIUM CHLORIDE 0.9% IV SOLUTION: Freq: Once | INTRAVENOUS | Status: AC

## 2020-02-06 MED ORDER — OXYCODONE HCL 5 MG PO TABS
5.0000 mg | ORAL_TABLET | Freq: Four times a day (QID) | ORAL | Status: DC | PRN
  Administered 2020-02-07 – 2020-02-09 (×9): 5 mg via ORAL
  Filled 2020-02-06 (×11): qty 1

## 2020-02-06 MED ORDER — TRAMADOL HCL 50 MG PO TABS
50.0000 mg | ORAL_TABLET | Freq: Four times a day (QID) | ORAL | Status: DC
  Administered 2020-02-07 – 2020-02-09 (×9): 50 mg via ORAL
  Filled 2020-02-06 (×9): qty 1

## 2020-02-06 MED ORDER — MAGNESIUM SULFATE 2 GM/50ML IV SOLN
2.0000 g | Freq: Once | INTRAVENOUS | Status: AC
  Administered 2020-02-06: 2 g via INTRAVENOUS
  Filled 2020-02-06: qty 50

## 2020-02-06 NOTE — NC FL2 (Signed)
[place] LEVEL OF CARE SCREENING TOOL     IDENTIFICATION  Patient Name: Rhonda Martin Birthdate: 1975-06-19 Sex: female Admission Date (Current Location): 02/03/2020  [place] and [place] Number:  Engineering geologist and Address:  [facility], [address] [place], [place] [address]      Provider Number: [id number]  Attending Physician Name and Address:  Damita Lack, MD  Relative Name and Phone Number:  Gasper Sells [phone]    Current Level of Care: Hospital Recommended Level of Care: [place] Prior Approval Number:    Date Approved/Denied:   PASRR Number: [id number]  Discharge Plan: SNF    Current Diagnoses: Patient Active Problem List   Diagnosis Date Noted   Wound dehiscence, surgical, initial encounter 02/04/2020   Infection of deep incisional surgical site after procedure    Wound dehiscence    S/P TKR (total knee replacement) using cement, 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   Pancytopenia ([place])    Hypomagnesemia    GI bleeding 11/24/2019   AKI (acute kidney injury) ([place])    GIB (gastrointestinal bleeding)    Elevated LFTs 10/24/2019   Hyperbilirubinemia 10/24/2019   Hematemesis 10/23/2019   Acute esophagitis    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    Neutropenic fever ([place])    Lobar pneumonia ([place])    Hepatic encephalopathy ([place]) 08/23/2019   Hypotension    Hallucinations 50/93/2671   Acute metabolic encephalopathy 24/58/0998   Acute hepatic encephalopathy 08/22/2019   Sepsis ([place]) 07/10/2019   Hyponatremia 07/10/2019   Hypokalemia 07/10/2019   Anxiety 07/10/2019   COPD (chronic obstructive pulmonary disease) ([place]) 07/10/2019   HTN (hypertension) 07/10/2019   Liver cirrhosis (HCC)    Symptomatic anemia 06/26/2019   IDA (iron deficiency anemia) 06/26/2019   Insomnia 04/03/2019   Sinus tachycardia 04/03/2019   Knee pain, right 03/31/2019    Bilateral leg edema 02/06/2019   Generalized anxiety disorder 01/23/2019   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   Alcohol withdrawal delirium ([place]) 01/20/2019   Chronic hepatitis C without hepatic coma ([place]) 12/20/2018   Thrombocytopenia ([place]) 12/20/2018   Alcohol abuse 12/20/2018   Transaminitis 12/20/2018   Tobacco abuse 12/20/2018   Easy bruising 12/20/2018   Weakness 12/20/2018   Folate deficiency 12/20/2018   Hepatitis B core antibody negative 12/20/2018   Charcot-Marie-Tooth disease     Orientation RESPIRATION BLADDER Height & Weight     Self, Time, Situation, Place  Normal Continent, External catheter Weight: 63.5 kg Height:  5\' 2"  (157.5 cm)  BEHAVIORAL SYMPTOMS/MOOD NEUROLOGICAL BOWEL NUTRITION STATUS      Continent Diet (Heart Healthy)  AMBULATORY STATUS COMMUNICATION OF NEEDS Skin   Extensive Assist Verbally Normal                       Personal Care Assistance Level of Assistance  Bathing, Dressing Bathing Assistance: Limited assistance   Dressing Assistance: Limited assistance     Functional Limitations Info             SPECIAL CARE FACTORS FREQUENCY  PT (By licensed PT), OT (By licensed OT)     PT Frequency: 5 times per week OT Frequency: 5 times per week            Contractures Contractures Info: Not present    Additional Factors Info  Code Status, Allergies Code Status Info: full code Allergies Info: Tylenol           Current Medications (02/06/2020):  This  is the current hospital active medication list Current Facility-Administered Medications  Medication Dose Route Frequency Provider Last Rate Last Admin   0.9 %  sodium chloride infusion   Intravenous Continuous Hessie Knows, MD 75 mL/hr at 02/04/20 1500 Restarted at 02/04/20 1500   bisacodyl (DULCOLAX) suppository 10 mg  10 mg Rectal Daily PRN Hessie Knows, MD       ceFEPIme (MAXIPIME) 2 g in sodium chloride 0.9 % 100 mL IVPB  2 g Intravenous Q8H Leonel Ramsay,  MD 200 mL/hr at 02/06/20 0657 2 g at 02/06/20 0657   dextromethorphan-guaiFENesin ([place] DM) 30-600 MG per 12 hr tablet 1 tablet  1 tablet Oral BID PRN Amin, Jeanella Flattery, MD       dicyclomine (BENTYL) tablet 20 mg  20 mg Oral TID AC & HS Hessie Knows, MD   20 mg at 02/06/20 5631   docusate sodium (COLACE) capsule 100 mg  100 mg Oral BID Hessie Knows, MD   100 mg at 02/06/20 0912   DULoxetine (CYMBALTA) DR capsule 20 mg  20 mg Oral Daily Hessie Knows, MD   20 mg at 02/06/20 4970   ferrous sulfate tablet 325 mg  325 mg Oral Q breakfast Hessie Knows, MD   325 mg at 26/37/85 8850   folic acid (FOLVITE) tablet 1 mg  1 mg Oral Daily Annita Brod, MD   1 mg at 02/06/20 2774   furosemide (LASIX) tablet 20 mg  20 mg Oral Daily Hessie Knows, MD   20 mg at 02/06/20 0912   gabapentin (NEURONTIN) capsule 300 mg  300 mg Oral TID PRN Hessie Knows, MD   300 mg at 02/05/20 1535   ipratropium-albuterol (DUONEB) 0.5-2.5 (3) MG/3ML nebulizer solution 3 mL  3 mL Nebulization Q4H PRN Amin, Ankit Chirag, MD       lactulose ([place]) 10 GM/15ML solution 30 g  30 g Oral TID Amin, Ankit Chirag, MD   30 g at 02/06/20 0925   LORazepam (ATIVAN) tablet 1-4 mg  1-4 mg Oral Q1H PRN Annita Brod, MD   1 mg at 02/05/20 2203   Or   LORazepam (ATIVAN) injection 1-4 mg  1-4 mg Intravenous Q1H PRN Annita Brod, MD       magnesium citrate solution 1 Bottle  1 Bottle Oral Once PRN Hessie Knows, MD       magnesium oxide (MAG-OX) tablet 400 mg  400 mg Oral Daily Hessie Knows, MD   400 mg at 02/06/20 0914   metoCLOPramide (REGLAN) tablet 5-10 mg  5-10 mg Oral Q8H PRN Hessie Knows, MD       Or   metoCLOPramide (REGLAN) injection 5-10 mg  5-10 mg Intravenous Q8H PRN Hessie Knows, MD       metoprolol succinate (TOPROL-XL) 24 hr tablet 12.5 mg  12.5 mg Oral Daily Hessie Knows, MD   12.5 mg at 02/05/20 1116   midodrine (PROAMATINE) tablet 5 mg  5 mg Oral BID PRN Hessie Knows, MD   5 mg at  02/06/20 0013   multivitamin with minerals tablet 1 tablet  1 tablet Oral Daily Annita Brod, MD   1 tablet at 02/06/20 0914   OLANZapine (ZYPREXA) tablet 10 mg  10 mg Oral QHS Hessie Knows, MD   10 mg at 02/05/20 2153   ondansetron (ZOFRAN) tablet 4 mg  4 mg Oral Q6H PRN Hessie Knows, MD       Or   ondansetron [facility]) injection 4 mg  4 mg  Intravenous Q6H PRN Hessie Knows, MD       oxyCODONE (Oxy IR/ROXICODONE) immediate release tablet 5 mg  5 mg Oral Q6H PRN Amin, Ankit Chirag, MD       oxyCODONE (OXYCONTIN) 12 hr tablet 10 mg  10 mg Oral Q12H Amin, Ankit Chirag, MD   10 mg at 02/06/20 0932   pantoprazole (PROTONIX) EC tablet 40 mg  40 mg Oral Daily Hessie Knows, MD   40 mg at 02/06/20 0912   polyethylene glycol (MIRALAX / GLYCOLAX) packet 17 g  17 g Oral Daily PRN Hessie Knows, MD       senna (SENOKOT) tablet 8.6 mg  1 tablet Oral BID Hessie Knows, MD   8.6 mg at 02/06/20 0349   spironolactone (ALDACTONE) tablet 25 mg  25 mg Oral Daily Hessie Knows, MD   25 mg at 02/06/20 0914   sucralfate (CARAFATE) tablet 1 g  1 g Oral TID WC & HS Hessie Knows, MD   1 g at 02/06/20 0912   thiamine tablet 100 mg  100 mg Oral Daily Annita Brod, MD   100 mg at 02/06/20 1791   Or   thiamine (B-1) injection 100 mg  100 mg Intravenous Daily Annita Brod, MD   100 mg at 02/05/20 1005   traMADol (ULTRAM) tablet 50 mg  50 mg Oral Q6H Hessie Knows, MD       vancomycin Alcus Dad) IVPB 750 mg/150 mL  750 mg Intravenous Q12H Annita Brod, MD 150 mL/hr at 02/05/20 2149 750 mg at 02/05/20 2149   vitamin B-12 (CYANOCOBALAMIN) tablet 500 mcg  500 mcg Oral Daily Hessie Knows, MD   500 mcg at 02/06/20 5056     Discharge Medications: Please see discharge summary for a list of discharge medications.  Relevant Imaging Results:  Relevant Lab Results:   Additional Information SS# [id number]  Su Hilt, RN

## 2020-02-06 NOTE — TOC Progression Note (Signed)
Transition of Care [facility]) - Progression Note    Patient Details  Name: Rhonda Martin MRN: [id number] Date of Birth: 06-Mar-1976  Transition of Care [facility]) CM/SW [place], RN Phone Number: 02/06/2020, 3:49 PM  Clinical Narrative:   Spoke with the patient about the DC plan  She lives at home with her husband She has a wheelchair at home She refuses to go to SNF due to having to stay 30 days and sign over her check, she refuses Home health services and said she will be fine without it, she stated that she will arrange someone to hekp her with what she needs, she stated that she does have transportation to get to same day surgery for any IV ABX that she may need., I notified Dr Rudene Christians of this         Expected Discharge Plan and Services                                                 Social Determinants of Health (SDOH) Interventions    Readmission Risk Interventions Readmission Risk Prevention Plan 11/29/2019 10/25/2019 01/21/2019  Post Dischage Appt - - Complete  Medication Screening - - Complete  Transportation Screening Complete Complete Complete  PCP or Specialist Appt within 3-5 Days - Complete -  HRI or Home Care Consult - Complete -  Medication Review (RN Care Manager) Complete Complete -  PCP or Specialist appointment within 3-5 days of discharge Complete - -  Palliative Care Screening Not Applicable - -  [place] Not Applicable - -

## 2020-02-06 NOTE — Progress Notes (Signed)
PROGRESS NOTE    Rhonda Martin  ZYS:[id number] DOB: Dec 27, 1975 DOA: 02/03/2020 PCP: Volney American, PA-C   Brief Narrative:  44 year old with history of polysubstance abuse, alcohol use, hepatitis C with cirrhosis underwentRight total knee arthroplasty 3 weeks prior to admissionWith poor follow-up presented to the hospital a week prior to admission for drainage from the incision site on 8/22.  He ended up leaving AMA.  And followed up outpatient with orthopedic office and wound VAC was placed on 8/24 which apparently patient removed on 8/26.  Now coming to the ER with signs of sepsis and worsening of wound infection.  Patient was taken to the OR on 8/29 and found to have completely ruptured patella.  Vancomycin beads were placed, capsule was repaired along with knee immobilizer.  Infectious disease was consulted for their input.   Assessment & Plan:   Principal Problem:   Sinus tachycardia Active Problems:   Chronic hepatitis C without hepatic coma (HCC)   Alcohol abuse   Tobacco abuse   Alcohol withdrawal delirium (HCC)   Knee pain, right   Sepsis (HCC)   COPD (chronic obstructive pulmonary disease) (HCC)   HTN (hypertension)   Hepatic encephalopathy (HCC)   S/P TKR (total knee replacement) using cement, right   Wound dehiscence, surgical, initial encounter   Sepsis secondary to right knee wound infection after recent right knee total arthroplasty Medical noncompliance -Sepsis physiology is improving but still sinus tachycardia -Status post surgery by orthopedic on 8/29-infected right knee with ruptured patellar tendon status post knee revision, irrigation and debridement. Wound Vac in place. -Blood cultures-negative to date -Wound cultures-no organisms seen for now. -Antibiotics-vancomycin.  Zosyn changed to cefepime. -Infectious disease consulted -Continue OxyContin, reduce the frequency of oxycodone IR to every 6 hours.  Discontinue IV morphine.  Acute anemia,  unknown exact etiology, baseline hemoglobin around 8.5.  This morning 6.5 -No obvious evidence of active blood loss.  1 unit PRBC transfusion.  History of polysubstance abuse/alcohol use/hepatitis C Liver cirrhosis -Currently on lactulose twice daily.  Advised to quit drinking.  Supportive care.  Should follow-up outpatient with PCP.  Unsure of his previous hepatitis C treatment -Continue Lasix and Aldactone -Folic acid, multivitamin and thiamine -Lactulose 3 times daily.  History of COPD -As needed bronchodilators  Essential hypertension -Lasix 20 mg daily, Toprol-XL 12.5 mg daily, Aldactone   DVT prophylaxis: Foot Pump / plexipulse Start: 02/04/20 1408 SCDs Start: 02/03/20 1424  Code Status: Full code Family Communication:    Status is: Inpatient  Remains inpatient appropriate because:Hemodynamically unstable   Dispo: The patient is from: Home              Anticipated d/c is to: Home              Anticipated d/c date is: 2 days              Patient currently is not medically stable to d/c. Complaining of Knee pain, on going treatment of Infection with IV Abx.  In the meantime awaiting clearance from orthopedic, infectious disease and hemoglobin to stabilize.   Body mass index is 25.61 kg/m.  Subjective: During my evaluation patient was drowsy but easily arousable.  She had just received her p.o. OxyContin.  Physical therapy was getting ready to work with her.  Review of Systems Otherwise negative except as per HPI, including: General: Denies fever, chills, night sweats or unintended weight loss. Resp: Denies cough, wheezing, shortness of breath. Cardiac: Denies chest pain, palpitations, orthopnea, paroxysmal  nocturnal dyspnea. GI: Denies abdominal pain, nausea, vomiting, diarrhea or constipation GU: Denies dysuria, frequency, hesitancy or incontinence MS: Right knee pain is expected Neuro: Denies headache, neurologic deficits (focal weakness, numbness, tingling),  abnormal gait Psych: Denies anxiety, depression, SI/HI/AVH Skin: Denies new rashes or lesions ID: Denies sick contacts, exotic exposures, travel  Examination: Constitutional: Drowsy but easily arousable Respiratory: Clear to auscultation bilaterally Cardiovascular: Normal sinus rhythm, no rubs Abdomen: Nontender nondistended good bowel sounds Musculoskeletal: No edema noted Skin: No rashes seen Neurologic: CN 2-12 grossly intact.  And nonfocal Psychiatric: Normal judgment and insight. Alert and oriented x 3. Normal mood. Right knee dressing in place with wound VAC  Objective: Vitals:   02/05/20 2119 02/06/20 0002 02/06/20 0517 02/06/20 0551  BP: 95/61 (!) 104/55 (!) 106/50 (!) 99/46  Pulse: (!) 101 (!) 104 99 96  Resp: 17 17 17 17   Temp: 98.7 F (37.1 C) 98.2 F (36.8 C) 98.8 F (37.1 C) 99.5 F (37.5 C)  TempSrc: Oral Oral Oral Oral  SpO2: 100% 99% 95% 98%  Weight:      Height:        Intake/Output Summary (Last 24 hours) at 02/06/2020 0758 Last data filed at 02/06/2020 0600 Gross per 24 hour  Intake 1000 ml  Output 300 ml  Net 700 ml   Filed Weights   02/03/20 0939  Weight: 63.5 kg   Data Reviewed:   CBC: Recent Labs  Lab 02/03/20 0951 02/04/20 1646 02/05/20 0450 02/06/20 0227  WBC 5.3 6.3 5.2 5.8  NEUTROABS 3.9  --   --   --   HGB 8.6* 8.3* 7.0* 6.5*  HCT 26.4* 26.3* 21.8* 19.5*  MCV 82.8 84.0 83.2 81.9  PLT 173 155 139* 916*   Basic Metabolic Panel: Recent Labs  Lab 02/03/20 0951 02/04/20 1646 02/05/20 0450 02/06/20 0227  NA 133*  --  137 140  K 3.8  --  3.7 3.7  CL 98  --  107 109  CO2 24  --  25 27  GLUCOSE 132*  --  153* 115*  BUN 6  --  9 10  CREATININE 0.47  --  0.46 0.41*  CALCIUM 8.4*  --  8.4* 7.6*  MG 1.7 1.6*  --  1.7  PHOS  --  4.4  --   --    GFR: Estimated Creatinine Clearance: 78.6 mL/min (A) (by C-G formula based on SCr of 0.41 mg/dL (L)). Liver Function Tests: Recent Labs  Lab 02/03/20 0951 02/05/20 0450  AST 39 33   ALT 20 18  ALKPHOS 106 87  BILITOT 1.4* 1.2  PROT 7.1 6.2*  ALBUMIN 2.9* 2.4*   No results for input(s): LIPASE, AMYLASE in the last 168 hours. Recent Labs  Lab 02/03/20 1511 02/05/20 0450  AMMONIA 31 57*   Coagulation Profile: No results for input(s): INR, PROTIME in the last 168 hours. Cardiac Enzymes: No results for input(s): CKTOTAL, CKMB, CKMBINDEX, TROPONINI in the last 168 hours. BNP (last 3 results) No results for input(s): PROBNP in the last 8760 hours. HbA1C: No results for input(s): HGBA1C in the last 72 hours. CBG: No results for input(s): GLUCAP in the last 168 hours. Lipid Profile: No results for input(s): CHOL, HDL, LDLCALC, TRIG, CHOLHDL, LDLDIRECT in the last 72 hours. Thyroid Function Tests: No results for input(s): TSH, T4TOTAL, FREET4, T3FREE, THYROIDAB in the last 72 hours. Anemia Panel: No results for input(s): VITAMINB12, FOLATE, FERRITIN, TIBC, IRON, RETICCTPCT in the last 72 hours. Sepsis Labs: Recent Labs  Lab 02/03/20 0944  LATICACIDVEN 1.8    Recent Results (from the past 240 hour(s))  Blood culture (routine x 2)     Status: None (Preliminary result)   Collection Time: 02/03/20  9:48 AM   Specimen: BLOOD  Result Value Ref Range Status   Specimen Description BLOOD RFA  Final   Special Requests   Final    BOTTLES DRAWN AEROBIC AND ANAEROBIC Blood Culture adequate volume   Culture   Final    NO GROWTH 3 DAYS Performed at [facility], [address], [place], [place] [address]    Report Status PENDING  Incomplete  Blood culture (routine x 2)     Status: None (Preliminary result)   Collection Time: 02/03/20  9:50 AM   Specimen: BLOOD  Result Value Ref Range Status   Specimen Description BLOOD RT UPPER ARM  Final   Special Requests   Final    BOTTLES DRAWN AEROBIC AND ANAEROBIC Blood Culture results may not be optimal due to an inadequate volume of blood received in culture bottles   Culture   Final    NO GROWTH 3  DAYS Performed at [facility], [address], [place], [place] [address]    Report Status PENDING  Incomplete  SARS Coronavirus 2 by RT PCR (hospital order, performed in [place] hospital lab) Nasopharyngeal Nasopharyngeal Swab     Status: None   Collection Time: 02/03/20  9:51 AM   Specimen: Nasopharyngeal Swab  Result Value Ref Range Status   SARS Coronavirus 2 NEGATIVE NEGATIVE Final    Comment: (NOTE) SARS-CoV-2 target nucleic acids are NOT DETECTED.  The SARS-CoV-2 RNA is generally detectable in upper and lower respiratory specimens during the acute phase of infection. The lowest concentration of SARS-CoV-2 viral copies this assay can detect is 250 copies / mL. A negative result does not preclude SARS-CoV-2 infection and should not be used as the sole basis for treatment or other patient management decisions.  A negative result may occur with improper specimen collection / handling, submission of specimen other than nasopharyngeal swab, presence of viral mutation(s) within the areas targeted by this assay, and inadequate number of viral copies (<250 copies / mL). A negative result must be combined with clinical observations, patient history, and epidemiological information.  Fact Sheet for Patients:   StrictlyIdeas.no  Fact Sheet for Healthcare Providers: BankingDealers.co.za  This test is not yet approved or  cleared by the [place] FDA and has been authorized for detection and/or diagnosis of SARS-CoV-2 by FDA under an Emergency Use Authorization (EUA).  This EUA will remain in effect (meaning this test can be used) for the duration of the COVID-19 declaration under Section 564(b)(1) of the Act, 21 U.S.C. section 360bbb-3(b)(1), unless the authorization is terminated or revoked sooner.  Performed at [facility], [address] [place], [place] [address]   Aerobic/Anaerobic Culture  (surgical/deep wound)     Status: None (Preliminary result)   Collection Time: 02/04/20  9:23 AM   Specimen: PATH Other; Tissue  Result Value Ref Range Status   Specimen Description   Final    KNEE IRRIGATION AND DEBRIDEMENT RIGHT KNEE Performed at [facility], [address], [place], [place] [address]    Special Requests   Final    NONE Performed at [facility], [address] [place], [place] [address]    Gram Stain NO WBC SEEN NO ORGANISMS SEEN   Final   Culture   Final  TOO YOUNG TO READ Performed at [facility], [place] [address], [place], [place] [address]    Report Status PENDING  Incomplete  Aerobic/Anaerobic Culture (surgical/deep wound)     Status: None (Preliminary result)   Collection Time: 02/04/20  9:23 AM   Specimen: PATH Other; Tissue  Result Value Ref Range Status   Specimen Description   Final    KNEE Performed at [facility], [address], [place], [place] [address]    Special Requests   Final    KNEE RIGHT KNEE Performed at [facility], [address] [place], [place] [address]    Gram Stain   Final    FEW WBC PRESENT, PREDOMINANTLY MONONUCLEAR NO ORGANISMS SEEN    Culture   Final    TOO YOUNG TO READ Performed at [facility], [place] [address], [place], [place] [address]    Report Status PENDING  Incomplete         Radiology Studies: DG Knee 1-2 Views Right  Result Date: 02/04/2020 CLINICAL DATA:  Postop pain EXAM: RIGHT KNEE - 1-2 VIEW COMPARISON:  February 03, 2020 FINDINGS: The tibial and femoral hardware from the previous knee replacement is been removed. Antibiotic beads have been placed into the cavities left by the removed hardware. Large joint effusion. Apparent fracture fragment adjacent to the lateral aspect of the proximal tibia. Apparent fracture line extending through the proximal tibia. IMPRESSION: 1. Removal of hardware with antibiotic beads placed. 2. Apparent fracture  fragment adjacent to the lateral aspect of the proximal tibia. Subtle fracture line extending through the tibial metaphysis. Electronically Signed   By: Dorise Bullion III M.D   On: 02/04/2020 13:56   Scheduled Meds: . dicyclomine  20 mg Oral TID AC & HS  . docusate sodium  100 mg Oral BID  . DULoxetine  20 mg Oral Daily  . ferrous sulfate  325 mg Oral Q breakfast  . folic acid  1 mg Oral Daily  . furosemide  20 mg Oral Daily  . lactulose  30 g Oral TID  . magnesium oxide  400 mg Oral Daily  . metoprolol succinate  12.5 mg Oral Daily  . multivitamin with minerals  1 tablet Oral Daily  . OLANZapine  10 mg Oral QHS  . oxyCODONE  10 mg Oral Q12H  . pantoprazole  40 mg Oral Daily  . senna  1 tablet Oral BID  . spironolactone  25 mg Oral Daily  . sucralfate  1 g Oral TID WC & HS  . thiamine  100 mg Oral Daily   Or  . thiamine  100 mg Intravenous Daily  . vitamin B-12  500 mcg Oral Daily   Continuous Infusions: . sodium chloride 75 mL/hr at 02/04/20 1500  . ceFEPime (MAXIPIME) IV 2 g (02/06/20 0657)  . vancomycin 750 mg (02/05/20 2149)     LOS: 3 days   Time spent= 35 mins    Aliegha Paullin Arsenio Loader, MD [facility]  If 7PM-7AM, please contact night-coverage  02/06/2020, 7:58 AM

## 2020-02-06 NOTE — Evaluation (Signed)
Physical Therapy Evaluation Patient Details Name: Rhonda Martin MRN: [id number] DOB: 13-Sep-1975 Today's Date: 02/06/2020   History of Present Illness  Pt is a 44 y.o. female presenting to hospital 8/28 with confusion and R knee pain.  Pt with recent admit 8/5-8/10 for R TKA (Dr. Rudene Christians).  New imaging of R knee showing apparent fx fragment adjacent to lateral aspect of proximal tibia; subtle fx line extending through the tibial metaphysis.  Pt now admitted with sepsis secondary surgical site wound dehiscence; also noted with patellar tendon rupture.  Pt s/p R total knee revision and I&D 8/29.  PMH includes alcohol abuse, anxiety, h/o back injury, Charcot-Marie-Tooth disease, COPD, Hepatitis C, htn, thrombocytopenia.  Clinical Impression  Prior to hospital admission, pt reports being ambulatory (use of cane off and on).  Nurse reports pt finished with blood transfusion this morning and cleared pt to participate in therapy (pt received pain medication prior to PT session).  Pt's alertness fluctuating between drowsy and alert during session; multiple items of food found scattered in pt's bed.  Currently pt is min assist semi-supine to sitting edge of bed and mod assist x2 stand pivot transfer bed to recliner (pt keeping R LE NWB'ing d/t c/o R LE pain).  Pt still appearing confused but improved compared to yesterday's therapy attempts; impulsiveness noted at times.  Limited functional mobility d/t R LE pain.  Pt would benefit from skilled PT to address noted impairments and functional limitations (see below for any additional details).  Upon hospital discharge, pt would benefit from STR.  Currently pt's therapy frequency set at QD but may be able to increase frequency to BID pending pt's pain control and participation levels.    Follow Up Recommendations SNF    Equipment Recommendations  Rolling walker with 5" wheels;3in1 (PT);Wheelchair (measurements PT);Wheelchair cushion (measurements PT)     Recommendations for Other Services OT consult     Precautions / Restrictions Precautions Precautions: Fall;Knee Precaution Booklet Issued: No Precaution Comments: Wound vac Required Braces or Orthoses: Knee Immobilizer - Right Knee Immobilizer - Right: On at all times Restrictions Weight Bearing Restrictions: Yes RLE Weight Bearing: Partial weight bearing RLE Partial Weight Bearing Percentage or Pounds: 50%      Mobility  Bed Mobility Overal bed mobility: Needs Assistance Bed Mobility: Supine to Sit     Supine to sit: Min assist;HOB elevated     General bed mobility comments: assist for R LE semi-supine to sitting edge of bed  Transfers Overall transfer level: Needs assistance Equipment used: None Transfers: Stand Pivot Transfers   Stand pivot transfers: Mod assist;+2 physical assistance       General transfer comment: stand pivot towards L side with mod assist x2; vc's for technique and WB'ing status  Ambulation/Gait             General Gait Details: Deferred d/t pt's elevated R LE pain with activity  Stairs            Wheelchair Mobility    Modified Rankin (Stroke Patients Only)       Balance Overall balance assessment: Needs assistance Sitting-balance support: Single extremity supported Sitting balance-Leahy Scale: Fair Sitting balance - Comments: steady static sitting with at least single UE support                                     Pertinent Vitals/Pain Pain Assessment: Faces Faces Pain Scale: Hurts little more (  8/10 with activity; 4/10 at rest) Pain Location: R LE Pain Descriptors / Indicators: Constant;Guarding;Sore;Tender;Sharp Pain Intervention(s): Limited activity within patient's tolerance;Monitored during session;Premedicated before session;Repositioned (nurse notified of pt's pain complaints)  HR 95-107 bpm and O2 sats 92% or greater on room air during sessions activities.  BP 105/62 sitting edge of bed and BP  104/56 sitting in recliner end of session.    Home Living Family/patient expects to be discharged to:: Private residence Living Arrangements: Spouse/significant other Available Help at Discharge: Family Type of Home: Mobile home Home Access: Stairs to enter Entrance Stairs-Rails: Left Entrance Stairs-Number of Steps: 4 Home Layout: One [place]: Clinical cytogeneticist - 2 wheels;Cane - single point;Wheelchair - manual      Prior Function Level of Independence: Independent with assistive device(s)         Comments: Pt reports ambulating off/on with cane     Hand Dominance        Extremity/Trunk Assessment   Upper Extremity Assessment Upper Extremity Assessment: Generalized weakness    Lower Extremity Assessment Lower Extremity Assessment: RLE deficits/detail (at least 3/5 AROM hip flexion, knee flexion/extension, and DF/PF) RLE Deficits / Details: able to wiggle toes but pt did not want to move R LE otherwise RLE: Unable to fully assess due to pain    Cervical / Trunk Assessment Cervical / Trunk Assessment: Normal  Communication   Communication:  (difficult to understand pt intermittently)  Cognition Arousal/Alertness:  (Fluctuating between alert and drowsy) Behavior During Therapy: Anxious;Restless;Impulsive                                   General Comments: Oriented to self and partial situation      General Comments   Nursing cleared pt for participation in physical therapy.  Pt agreeable to PT session.    Exercises  Transfer training   Assessment/Plan    PT Assessment Patient needs continued PT services  PT Problem List Decreased strength;Decreased range of motion;Decreased activity tolerance;Decreased balance;Decreased mobility;Decreased coordination;Decreased knowledge of use of DME;Decreased safety awareness;Pain;Decreased cognition;Decreased knowledge of precautions;Decreased skin integrity       PT Treatment Interventions  DME instruction;Gait training;Stair training;Functional mobility training;Therapeutic activities;Therapeutic exercise;Balance training;Patient/family education    PT Goals (Current goals can be found in the Care Plan section)  Acute Rehab PT Goals Patient Stated Goal: get pain under control PT Goal Formulation: With patient Time For Goal Achievement: 02/20/20 Potential to Achieve Goals: Fair    Frequency 7X/week   Barriers to discharge Decreased caregiver support      Co-evaluation               AM-PAC PT "6 Clicks" Mobility  Outcome Measure Help needed turning from your back to your side while in a flat bed without using bedrails?: None Help needed moving from lying on your back to sitting on the side of a flat bed without using bedrails?: A Little Help needed moving to and from a bed to a chair (including a wheelchair)?: Total Help needed standing up from a chair using your arms (e.g., wheelchair or bedside chair)?: Total Help needed to walk in hospital room?: Total Help needed climbing 3-5 steps with a railing? : Total 6 Click Score: 11    End of Session Equipment Utilized During Treatment: Gait belt Activity Tolerance: Patient limited by pain Patient left: in chair;with call bell/phone within reach;with chair alarm set (pt refused to allow therapist  to elevate LE's in chair) Nurse Communication: Mobility status;Precautions;Other (comment) (pt's BP and pain reports; also that [place] removed) PT Visit Diagnosis: Unsteadiness on feet (R26.81);Other abnormalities of gait and mobility (R26.89);Muscle weakness (generalized) (M62.81);Difficulty in walking, not elsewhere classified (R26.2);Pain Pain - Right/Left: Right Pain - part of body: Knee    Time: 6767-2094 PT Time Calculation (min) (ACUTE ONLY): 48 min   Charges:   PT Evaluation $PT Eval Low Complexity: 1 Low PT Treatments $Therapeutic Exercise: 8-22 mins $Therapeutic Activity: 8-22 mins       Leitha Bleak, PT 02/06/20, 11:47 AM

## 2020-02-06 NOTE — Progress Notes (Signed)
Subjective: 2 Days Post-Op Procedure(s) (LRB): TOTAL KNEE REVISION (Right) IRRIGATION AND DEBRIDEMENT KNEE (Right) Patient reports pain as severe. Patient sleeping upon entering room.  Patient not participating with PT, states "too much pain".  Denies any CP, SOB, ABD pain. We will continue with therapy today.    Objective: Vital signs in last 24 hours: Temp:  [98.2 F (36.8 C)-99.5 F (37.5 C)] 99.5 F (37.5 C) (08/31 0551) Pulse Rate:  [96-118] 96 (08/31 0551) Resp:  [16-18] 17 (08/31 0551) BP: (95-127)/(46-71) 99/46 (08/31 0551) SpO2:  [94 %-100 %] 98 % (08/31 0551)  Intake/Output from previous day: 08/30 0701 - 08/31 0700 In: 1000 [P.O.:1000] Out: 300 [Urine:300] Intake/Output this shift: No intake/output data recorded.  Recent Labs    02/03/20 0951 02/04/20 1646 02/05/20 0450 02/06/20 0227  HGB 8.6* 8.3* 7.0* 6.5*   Recent Labs    02/05/20 0450 02/06/20 0227  WBC 5.2 5.8  RBC 2.62* 2.38*  HCT 21.8* 19.5*  PLT 139* 129*   Recent Labs    02/05/20 0450 02/06/20 0227  NA 137 140  K 3.7 3.7  CL 107 109  CO2 25 27  BUN 9 10  CREATININE 0.46 0.41*  GLUCOSE 153* 115*  CALCIUM 8.4* 7.6*   No results for input(s): LABPT, INR in the last 72 hours.  EXAM General - Patient is Alert, Appropriate and Oriented Extremity - Neurovascular intact Sensation intact distally Intact pulses distally Dorsiflexion/Plantar flexion intact No cellulitis present Compartment soft Dressing - dressing C/D/I , prevena intact with 125 cc drainage. Motor Function - intact, moving foot and toes well on exam.   Past Medical History:  Diagnosis Date   Alcohol abuse    Anxiety    Back injury    Cervical cancer (HCC)    Charcot-Marie-Tooth disease    COPD (chronic obstructive pulmonary disease) (HCC)    Family history of adverse reaction to anesthesia    PONV   GERD (gastroesophageal reflux disease)    Hepatitis    liver fibrosis, Hep C negative on 09/3019    Hypertension    Hypokalemia    IDA (iron deficiency anemia) 06/26/2019   Iron deficiency anemia    Leg injury    Liver cirrhosis (HCC)    Pneumonia    Sepsis ([place]) 07/10/2019   Symptomatic anemia 06/26/2019   Thrombocytopenia (HCC)     Assessment/Plan:   2 Days Post-Op Procedure(s) (LRB): TOTAL KNEE REVISION (Right) IRRIGATION AND DEBRIDEMENT KNEE (Right) Principal Problem:   Sinus tachycardia Active Problems:   Chronic hepatitis C without hepatic coma (HCC)   Alcohol abuse   Tobacco abuse   Alcohol withdrawal delirium (HCC)   Knee pain, right   Sepsis (HCC)   COPD (chronic obstructive pulmonary disease) (HCC)   HTN (hypertension)   Hepatic encephalopathy (HCC)   S/P TKR (total knee replacement) using cement, right   Wound dehiscence, surgical, initial encounter   Acute post op blood loss anemia with underlying chronic anemia   Estimated body mass index is 25.61 kg/m as calculated from the following:   Height as of this encounter: 5\' 2"  (1.575 m).   Weight as of this encounter: 63.5 kg. Advance diet Up with therapy, PWB (50%) RLE with knee immobilizer on at all times Acute post op blood loss anemia with underlying chronic anemia - Hgb 6.5. Currently receiving 1 unit of PRBC. Recheck labs in the am Continue with IV abx per ID Cultures still pending Patient complains of severe pain but does not  appear to be in distress. Patient sleeping upon entering room. Continue with current pain regimen.  DVT Prophylaxis - TED hose and SCDs    T. Rachelle Hora, PA-C [place] 02/06/2020, 8:23 AM

## 2020-02-06 NOTE — TOC Progression Note (Signed)
Transition of Care [facility]) - Progression Note    Patient Details  Name: Rhonda Martin MRN: [id number] Date of Birth: 08/13/1975  Transition of [facility]) CM/SW [place], RN Phone Number: 02/06/2020, 1:58 PM  Clinical Narrative:   Patient from home with her husband, FL2, PASSR, Bedsearch completed, will review the bed offers once completed         Expected Discharge Plan and Services                                                 Social Determinants of Health (SDOH) Interventions    Readmission Risk Interventions Readmission Risk Prevention Plan 11/29/2019 10/25/2019 01/21/2019  Post Dischage Appt - - Complete  Medication Screening - - Complete  Transportation Screening Complete Complete Complete  PCP or Specialist Appt within 3-5 Days - Complete -  HRI or [place] - Complete -  Medication Review ([place]) Complete Complete -  PCP or Specialist appointment within 3-5 days of discharge Complete - -  Palliative Care Screening Not Applicable - -  [place] Not Applicable - -

## 2020-02-06 NOTE — Progress Notes (Signed)
Pt has moments were she is more alert than others. Doctor adjusted pain meds. Pt was able to work with PT today. Pt transported from the bed to the chair.  Potassium level 3.7 and potassium supplements was administered. Pt remains SR on tele monitoring. Wound vac and knee mobilizer in place on right knee. No new  Drainage noted in wound vac. Pt is a 1- person assist. Pt is alert to self and time but not to situation. Easily reoriented and prompted. Will cont to monitor. Pt has a small bm today. Refused the remaining doses of Lactulose scheduled for today.

## 2020-02-07 DIAGNOSIS — I10 Essential (primary) hypertension: Secondary | ICD-10-CM

## 2020-02-07 DIAGNOSIS — D649 Anemia, unspecified: Secondary | ICD-10-CM

## 2020-02-07 DIAGNOSIS — J449 Chronic obstructive pulmonary disease, unspecified: Secondary | ICD-10-CM

## 2020-02-07 DIAGNOSIS — B182 Chronic viral hepatitis C: Secondary | ICD-10-CM

## 2020-02-07 LAB — CBC
HCT: 22 % — ABNORMAL LOW (ref 36.0–46.0)
Hemoglobin: 7.1 g/dL — ABNORMAL LOW (ref 12.0–15.0)
MCH: 27.5 pg (ref 26.0–34.0)
MCHC: 32.3 g/dL (ref 30.0–36.0)
MCV: 85.3 fL (ref 80.0–100.0)
Platelets: 107 10*3/uL — ABNORMAL LOW (ref 150–400)
RBC: 2.58 MIL/uL — ABNORMAL LOW (ref 3.87–5.11)
RDW: 18.2 % — ABNORMAL HIGH (ref 11.5–15.5)
WBC: 5.1 10*3/uL (ref 4.0–10.5)
nRBC: 0 % (ref 0.0–0.2)

## 2020-02-07 LAB — BASIC METABOLIC PANEL
Anion gap: 5 (ref 5–15)
BUN: 7 mg/dL (ref 6–20)
CO2: 23 mmol/L (ref 22–32)
Calcium: 7.3 mg/dL — ABNORMAL LOW (ref 8.9–10.3)
Chloride: 110 mmol/L (ref 98–111)
Creatinine, Ser: 0.42 mg/dL — ABNORMAL LOW (ref 0.44–1.00)
GFR calc Af Amer: >60 mL/min (ref >60)
GFR calc non Af Amer: >60 mL/min (ref >60)
Glucose, Bld: 116 mg/dL — ABNORMAL HIGH (ref 70–99)
Potassium: 3.5 mmol/L (ref 3.5–5.1)
Sodium: 138 mmol/L (ref 135–145)

## 2020-02-07 LAB — IRON AND TIBC
Iron: 16 ug/dL — ABNORMAL LOW (ref 28–170)
Saturation Ratios: 6 % — ABNORMAL LOW (ref 10.4–31.8)
TIBC: 277 ug/dL (ref 250–450)
UIBC: 261 ug/dL

## 2020-02-07 LAB — TYPE AND SCREEN
ABO/RH(D): A POS
Antibody Screen: NEGATIVE
Unit division: 0

## 2020-02-07 LAB — BPAM RBC
Blood Product Expiration Date: 202109242359
ISSUE DATE / TIME: 202108310528
Unit Type and Rh: 6200

## 2020-02-07 LAB — MAGNESIUM: Magnesium: 1.7 mg/dL (ref 1.7–2.4)

## 2020-02-07 LAB — VITAMIN B12: Vitamin B-12: 889 pg/mL (ref 180–914)

## 2020-02-07 LAB — FERRITIN: Ferritin: 77 ng/mL (ref 11–307)

## 2020-02-07 LAB — FOLATE: Folate: 17.2 ng/mL (ref >5.9)

## 2020-02-07 MED ORDER — POTASSIUM CHLORIDE CRYS ER 20 MEQ PO TBCR
20.0000 meq | EXTENDED_RELEASE_TABLET | Freq: Every day | ORAL | Status: DC
  Administered 2020-02-09: 20 meq via ORAL
  Filled 2020-02-07 (×2): qty 1

## 2020-02-07 MED ORDER — FLUCONAZOLE 100 MG PO TABS
400.0000 mg | ORAL_TABLET | Freq: Every day | ORAL | Status: DC
  Administered 2020-02-08 – 2020-02-09 (×2): 400 mg via ORAL
  Filled 2020-02-07 (×2): qty 4

## 2020-02-07 MED ORDER — POTASSIUM CHLORIDE CRYS ER 20 MEQ PO TBCR: 20.0000 meq | EXTENDED_RELEASE_TABLET | Freq: Every day | ORAL | Status: DC

## 2020-02-07 MED ORDER — FLUCONAZOLE IN SODIUM CHLORIDE 400-0.9 MG/200ML-% IV SOLN
400.0000 mg | Freq: Once | INTRAVENOUS | Status: DC
  Filled 2020-02-07: qty 200

## 2020-02-07 MED ORDER — POTASSIUM CHLORIDE CRYS ER 20 MEQ PO TBCR
40.0000 meq | EXTENDED_RELEASE_TABLET | Freq: Once | ORAL | Status: AC
  Administered 2020-02-07: 40 meq via ORAL
  Filled 2020-02-07: qty 2

## 2020-02-07 MED ORDER — FLUCONAZOLE IN SODIUM CHLORIDE 400-0.9 MG/200ML-% IV SOLN
800.0000 mg | Freq: Once | INTRAVENOUS | Status: DC
  Filled 2020-02-07: qty 400

## 2020-02-07 MED ORDER — HYDROXYZINE HCL 25 MG PO TABS
25.0000 mg | ORAL_TABLET | Freq: Three times a day (TID) | ORAL | Status: DC | PRN
  Administered 2020-02-07: 25 mg via ORAL
  Filled 2020-02-07 (×3): qty 1

## 2020-02-07 MED ORDER — MAGNESIUM SULFATE 4 GM/100ML IV SOLN
4.0000 g | Freq: Once | INTRAVENOUS | Status: AC
  Administered 2020-02-07: 4 g via INTRAVENOUS
  Filled 2020-02-07: qty 100

## 2020-02-07 NOTE — TOC Progression Note (Signed)
Transition of Care [facility]) - Progression Note    Patient Details  Name: Rhonda Martin MRN: [id number] Date of Birth: 1975-06-21  Transition of Care [facility]) CM/SW [place], RN Phone Number: 02/07/2020, 2:12 PM  Clinical Narrative:   Spoke to the patient and discussed that [facility] will not accept her back and that no other [facility] will accept her.  I explained that Dr Rudene Christians wants to see her next Wednesday for the wound vac dresisng change and the secretary will make the appointment, I explained for PT she will need to call Dr Rudene Christians office to make an outpatient appointment,. She stated understanding and agreed to call the Dr office to make the outpatient PT appointment         Expected Discharge Plan and Services                                                 Social Determinants of Health (SDOH) Interventions    Readmission Risk Interventions Readmission Risk Prevention Plan 11/29/2019 10/25/2019 01/21/2019  Post Dischage Appt - - Complete  Medication Screening - - Complete  Transportation Screening Complete Complete Complete  PCP or Specialist Appt within 3-5 Days - Complete -  HRI or Home Care Consult - Complete -  Medication Review ([place]) Complete Complete -  PCP or Specialist appointment within 3-5 days of discharge Complete - -  Palliative Care Screening Not Applicable - -  [place] Not Applicable - -

## 2020-02-07 NOTE — Evaluation (Signed)
Occupational Therapy Evaluation Patient Details Name: Rhonda Martin MRN: [id number] DOB: [birth year] Today's Date: 02/07/2020    History of Present Illness Pt is a 44 y.o. female presenting to hospital 8/28 with confusion and R knee pain.  Pt with recent admit 8/5-8/10 for R TKA (Dr. Rudene Christians).  New imaging of R knee showing apparent fx fragment adjacent to lateral aspect of proximal tibia; subtle fx line extending through the tibial metaphysis.  Pt now admitted with sepsis secondary surgical site wound dehiscence; also noted with patellar tendon rupture.  Pt s/p R total knee revision and I&D 8/29.  PMH includes alcohol abuse, anxiety, h/o back injury, Charcot-Marie-Tooth disease, COPD, Hepatitis C, htn, thrombocytopenia.   Clinical Impression   Pt seen for OT evaluation this date in setting of acute hospitalization with sepsis 2/2 wound dehiscence. Pt reports she was MOD I at baseline (before last hospitalization). Pt w/ c/o pain t/o evaluation/treatment this date and refuses to transition to EOB sitting or attempt mobilization. OT facilitates education re: role of OT, safety considerations, and importance of OOB activity. Pt with moderate reception of education detected, somewhat groggy/limited attn d/t focus/perseveration on pain. OT calls RN to advise that pt is requesting pain medication. Pt participates in some UB assessment and ADLs, requires setup to attain ADL items as pt will not mobilize to gather self care items. Overall, session limited d/t pain and pt somewhat self-limiting, requiring encouragement to participate at bed level. Education provided. Will continue to follow. Anticipate SNF is most appropriate d/c recommendation based on pt's tolerance and current fxl status relative to PLOF.     Follow Up Recommendations  Supervision/Assistance - 24 hour;SNF    Equipment Recommendations  3 in 1 bedside commode    Recommendations for Other Services       Precautions / Restrictions  Precautions Precautions: Fall;Knee Precaution Booklet Issued: No Precaution Comments: Wound vac Required Braces or Orthoses: Knee Immobilizer - Right Knee Immobilizer - Right: On at all times Restrictions Weight Bearing Restrictions: Yes RLE Weight Bearing: Partial weight bearing RLE Partial Weight Bearing Percentage or Pounds: 50%      Mobility Bed Mobility               General bed mobility comments: unable to assess, pt refuses mobilization citing pain  Transfers                 General transfer comment: unable to assess, pt refuses mobilization citing pain    Balance       Sitting balance - Comments: unable to assess, pt refuses mobilization citing pain       Standing balance comment: unable to assess, pt refuses mobilization citing pain                           ADL either performed or assessed with clinical judgement   ADL Overall ADL's : Needs assistance/impaired                                       General ADL Comments: For bed level for UB ADLs-SETUP. MAX to TOTAL A for LB ADLs bed level. Unable to assess ADLs in sitting, pt refuses to transition to sitting with assist.     Vision Baseline Vision/History:  (wears contacts) Wears Glasses: At all times Patient Visual Report: No change from baseline  Perception     Praxis      Pertinent Vitals/Pain Pain Assessment: 0-10 Pain Score: 9  Pain Location: R LE pt reports 9/10 pain at rest, but does not actually grimmace until attempts made to optimize positioning Pain Descriptors / Indicators: Grimacing;Guarding Pain Intervention(s): Limited activity within patient's tolerance;Monitored during session;Patient requesting pain meds-RN notified     Hand Dominance Right   Extremity/Trunk Assessment Upper Extremity Assessment Upper Extremity Assessment: Generalized weakness (ROM WFL, MMT grossly 4-/5)   Lower Extremity Assessment Lower Extremity Assessment: RLE  deficits/detail;LLE deficits/detail RLE Deficits / Details: refuses to move R LE beyond slight toe movement RLE: Unable to fully assess due to pain LLE Deficits / Details: somewhat WFL, somewhat limited ankle DF/PF which pt attributes to Charcot-Marie Tooth       Communication Communication Communication: Other (comment) (intermittently slurred)   Cognition Arousal/Alertness: Awake/alert Behavior During Therapy: Anxious;Restless Overall Cognitive Status: No family/caregiver present to determine baseline cognitive functioning                                 General Comments: oriented to self, time, place and some aspects of situatuion, but reports that she can't eat because she has another surgery today which is not evident from chart review.   General Comments       Exercises Other Exercises Other Exercises: OT facilitates ed re: role of OT, safety considerations, importance of OOB activity, fall prevention, pain mgt including deep breathing. Pt with moderate understanding, requires f/u for OOB mobility and pain mgt techniques.   Shoulder Instructions      Home Living Family/patient expects to be discharged to:: Private residence Living Arrangements: Spouse/significant other Available Help at Discharge: Family Type of Home: Mobile home Home Access: Stairs to enter Entrance Stairs-Number of Steps: 4 Entrance Stairs-Rails: Left Home Layout: One level     Bathroom Shower/Tub: Occupational psychologist: Standard Bathroom Accessibility: Yes   Home Equipment: Clinical cytogeneticist - 2 wheels;Cane - single point;Wheelchair - manual   Additional Comments: endorses >5 falls in a year      Prior Functioning/Environment Level of Independence: Independent with assistive device(s)        Comments: Pt reports intermittent use of SPC        OT Problem List: Decreased strength;Decreased coordination;Pain;Decreased range of motion;Decreased activity  tolerance;Decreased safety awareness;Impaired balance (sitting and/or standing);Decreased knowledge of use of DME or AE;Decreased knowledge of precautions      OT Treatment/Interventions: Self-care/ADL training;Therapeutic activities;Therapeutic exercise;DME and/or AE instruction;Patient/family education;Balance training;Energy conservation    OT Goals(Current goals can be found in the care plan section) Acute Rehab OT Goals Patient Stated Goal: get pain under control OT Goal Formulation: With patient Time For Goal Achievement: 02/21/20 Potential to Achieve Goals: Fair ADL Goals Pt Will Perform Upper Body Dressing: with supervision;sitting Pt Will Perform Lower Body Dressing: with min assist;sit to/from stand (with AE PRN) Pt Will Transfer to Toilet: with min assist;with mod assist;with +2 assist;stand pivot transfer;bedside commode (with good adherance to WB precautions) Pt Will Perform Toileting - Clothing Manipulation and hygiene: with mod assist;sitting/lateral leans Pt/caregiver will Perform Home Exercise Program: Increased strength;Both right and left upper extremity;With minimal assist Additional ADL Goal #1: Pt will tolerate further mobilization (at least sup to sit transition with MAX A) to further allow for development of OT POC.  OT Frequency: Min 1X/week   Barriers to D/C: Inaccessible home environment  Co-evaluation              AM-PAC OT "6 Clicks" Daily Activity     Outcome Measure Help from another person eating meals?: A Little Help from another person taking care of personal grooming?: A Little Help from another person toileting, which includes using toliet, bedpan, or urinal?: A Lot Help from another person bathing (including washing, rinsing, drying)?: A Lot Help from another person to put on and taking off regular upper body clothing?: A Little Help from another person to put on and taking off regular lower body clothing?: Total 6 Click Score: 14    End of Session    Activity Tolerance: Other (comment);Patient limited by pain (self limiting) Patient left: in bed;with call bell/phone within reach;with nursing/sitter in room (tele sitter, bed alarm panel somewhat defective, RN notified.)  OT Visit Diagnosis: Other abnormalities of gait and mobility (R26.89);Muscle weakness (generalized) (M62.81);Pain;History of falling (Z91.81) Pain - Right/Left: Right Pain - part of body: Knee;Leg;Ankle and joints of foot                Time: 5947-0761 OT Time Calculation (min): 23 min Charges:  OT General Charges $OT Visit: 1 Visit OT Evaluation $OT Eval Moderate Complexity: 1 Mod OT Treatments $Self Care/Home Management : 8-22 mins  Gerrianne Scale, MS, OTR/L ascom [phone] 02/07/20, 10:51 AM

## 2020-02-07 NOTE — Progress Notes (Signed)
Subjective: 3 Days Post-Op Procedure(s) (LRB): TOTAL KNEE REVISION (Right) IRRIGATION AND DEBRIDEMENT KNEE (Right) Patient reports pain as moderate. Patient sleeping upon entering room.  Patient with no other complaints.  Denies any CP, SOB, ABD pain. We will continue with therapy today.    Objective: Vital signs in last 24 hours: Temp:  [98.5 F (36.9 C)-100 F (37.8 C)] 99.1 F (37.3 C) (08/31 2345) Pulse Rate:  [96-109] 109 (08/31 2345) Resp:  [15-20] 20 (08/31 2345) BP: (96-116)/(51-71) 115/61 (08/31 2345) SpO2:  [95 %-98 %] 97 % (08/31 2345)  Intake/Output from previous day: 08/31 0701 - 09/01 0700 In: 2020.9 [P.O.:360; I.V.:660.9; IV Piggyback:1000] Out: 300 [Urine:300] Intake/Output this shift: No intake/output data recorded.  Recent Labs    02/04/20 1646 02/05/20 0450 02/06/20 0227 02/07/20 0502  HGB 8.3* 7.0* 6.5* 7.1*   Recent Labs    02/06/20 0227 02/07/20 0502  WBC 5.8 5.1  RBC 2.38* 2.58*  HCT 19.5* 22.0*  PLT 129* 107*   Recent Labs    02/06/20 0227 02/07/20 0502  NA 140 138  K 3.7 3.5  CL 109 110  CO2 27 23  BUN 10 7  CREATININE 0.41* 0.42*  GLUCOSE 115* 116*  CALCIUM 7.6* 7.3*   No results for input(s): LABPT, INR in the last 72 hours.  EXAM General - Patient is Alert, Appropriate and Oriented Extremity - Neurovascular intact Sensation intact distally Intact pulses distally Dorsiflexion/Plantar flexion intact No cellulitis present Compartment soft Dressing - dressing C/D/I , prevena intact with 200 cc drainage. Motor Function - intact, moving foot and toes well on exam.   Past Medical History:  Diagnosis Date  . Alcohol abuse   . Anxiety   . Back injury   . Cervical cancer ([place])   . Charcot-Marie-Tooth disease   . COPD (chronic obstructive pulmonary disease) ([place])   . Family history of adverse reaction to anesthesia    PONV  . GERD (gastroesophageal reflux disease)   . Hepatitis    liver fibrosis, Hep C negative on  09/3019  . Hypertension   . Hypokalemia   . IDA (iron deficiency anemia) 06/26/2019  . Iron deficiency anemia   . Leg injury   . Liver cirrhosis ([place])   . Pneumonia   . Sepsis ([place]) 07/10/2019  . Symptomatic anemia 06/26/2019  . Thrombocytopenia (HCC)     Assessment/Plan:   3 Days Post-Op Procedure(s) (LRB): TOTAL KNEE REVISION (Right) IRRIGATION AND DEBRIDEMENT KNEE (Right) Principal Problem:   Sinus tachycardia Active Problems:   Chronic hepatitis C without hepatic coma (HCC)   Alcohol abuse   Tobacco abuse   Alcohol withdrawal delirium (HCC)   Knee pain, right   Sepsis (HCC)   COPD (chronic obstructive pulmonary disease) (HCC)   HTN (hypertension)   Hepatic encephalopathy (HCC)   S/P TKR (total knee replacement) using cement, right   Wound dehiscence, surgical, initial encounter   Acute post op blood loss anemia with underlying chronic anemia   Estimated body mass index is 25.61 kg/m as calculated from the following:   Height as of this encounter: 5\' 2"  (1.575 m).   Weight as of this encounter: 63.5 kg. Advance diet Up with therapy, PWB (50%) RLE with knee immobilizer on at all times Acute post op blood loss anemia with underlying chronic anemia - Hgb7.1. S/p 1 unit of PRBC 02/06/20. Recheck labs in the am, may need 2nd unit of PRBC if drop in Hgb <7. Continue with IV abx per ID Cultures still  pending, no growth Patient complains of severe pain but does not appear to be in distress. Patient sleeping upon entering room. Continue with current pain regimen.  DVT Prophylaxis - TED hose and SCDs    T. Rachelle Hora, PA-C [place] 02/07/2020, 8:09 AM

## 2020-02-07 NOTE — Progress Notes (Signed)
Pharmacy Antibiotic Note  Rhonda Martin is a 44 y.o. female admitted on 02/03/2020 with sepsis. Patient recently underwent TKA 3 weeks ago. Wound VAC was placed 8/24 but removed by patient on 8/26. S/p TKR revision and placement of vanc beads in wound on 8/29. ID is following. Pharmacy has been consulted for Vanc dosing. Patient is also receiving Cefepime 2g IV q8h.    Day 5 IV abx, Vancomycin AM dose was missed on 8/31. WBC wnl, patient is afebrile, and renal function remains stable.   Plan: Continue Vancomycin 750 mg IV Q12H per dosing nomogram. Continue to monitor renal function and adjust dose if needed -Will plan to order vanc trough @1830  9/2  Height: 5\' 2"  (157.5 cm) Weight: 63.5 kg (140 lb) IBW/kg (Calculated) : 50.1  Temp (24hrs), Avg:99 F (37.2 C), Min:98.5 F (36.9 C), Max:100 F (37.8 C)  Recent Labs  Lab 02/03/20 0944 02/03/20 0951 02/04/20 1646 02/05/20 0450 02/06/20 0227 02/07/20 0502  WBC  --  5.3 6.3 5.2 5.8 5.1  CREATININE  --  0.47  --  0.46 0.41* 0.42*  LATICACIDVEN 1.8  --   --   --   --   --     Estimated Creatinine Clearance: 78.6 mL/min (A) (by C-G formula based on SCr of 0.42 mg/dL (L)).    Allergies  Allergen Reactions  . Tylenol [Acetaminophen] Other (See Comments)    Liver disease    Antimicrobials this admission: 8/28 Vanc >>  8/28 Zosyn >> 8/30 8/30 Cefepime >>  Dose adjustments this admission:   Microbiology results: 8/28 BCx: NGTD  8/29 Wcx: pending  Thank you for allowing pharmacy to be a part of this patient's care.  Sherilyn Banker, PharmD Pharmacy Resident  02/07/2020 8:51 AM

## 2020-02-07 NOTE — TOC Progression Note (Signed)
Transition of Care [facility]) - Progression Note    Patient Details  Name: Rhonda Martin MRN: [id number] Date of Birth: 07/16/1975  Transition of Care [facility]) CM/SW [place], RN Phone Number: 02/07/2020, 1:38 PM  Clinical Narrative:   Spoke with the patient and her husband at the bedside, I explained to go to SNF she will have to agree to stay 30 days and sign over her disability check, she has no bed offers, she agrees to home health, I called [facility] and they stated they closed the patient, she was often not home when they came to see her, she was "out running the roads", she had left AMA from the ED and is non complaint and did not cooperate with treatment, they are not willing to accept her back,  She stated they had spoke with the surgeon about this and he is aware, I notified the physician that she has a wound vac and will have to have someone to do the dressing change and possibly outpatient PT         Expected Discharge Plan and Services                                                 Social Determinants of Health (SDOH) Interventions    Readmission Risk Interventions Readmission Risk Prevention Plan 11/29/2019 10/25/2019 01/21/2019  Post Dischage Appt - - Complete  Medication Screening - - Complete  Transportation Screening Complete Complete Complete  PCP or Specialist Appt within 3-5 Days - Complete -  HRI or Home Care Consult - Complete -  Medication Review (RN Care Manager) Complete Complete -  PCP or Specialist appointment within 3-5 days of discharge Complete - -  Palliative Care Screening Not Applicable - -  [place] Not Applicable - -

## 2020-02-07 NOTE — Progress Notes (Signed)
Physical Therapy Treatment Patient Details Name: Rhonda Martin MRN: [id number] DOB: September 17, 1975 Today's Date: 02/07/2020    History of Present Illness Pt is a 44 y.o. female presenting to hospital 8/28 with confusion and R knee pain.  Pt with recent admit 8/5-8/10 for R TKA (Dr. Rudene Christians).  New imaging of R knee showing apparent fx fragment adjacent to lateral aspect of proximal tibia; subtle fx line extending through the tibial metaphysis.  Pt now admitted with sepsis secondary surgical site wound dehiscence; also noted with patellar tendon rupture.  Pt s/p R total knee revision and I&D 8/29.  PMH includes alcohol abuse, anxiety, h/o back injury, Charcot-Marie-Tooth disease, COPD, Hepatitis C, htn, thrombocytopenia.    PT Comments    Pt was long sitting in bed upon arriving. She is crying and disoriented. Reports 9/10 pain even with pain meds already issued.  Pt thinks she is going to have surgery and leg amputated today. Therapist did best to settle pt and reorient her to situation. Pt is impulsive, confused, but conversational throughout. Once pt agrees to session and is cooperative, pt was able to perform desired task with minimal assistance + increased time and constant vcs for safety. Pt unwilling to use AD to get on/off BSC. She performed transfers with NWB even with education on proper PWB status. Pt tends to dictate session and progression and has poor insight of deficits. She states several times she needs crutches and was unwilling to trial use of RW. Therapist grabbed crutches to appease patient but pt unwilling to attempt gait 2/2 to pain. Acute PT will continue efforts to progress patient. She is refusing to sign over check to go to SNF and refusing HHPT against recommendations. At conclusion of session, pt was in bed, bed alarm set, tele-sitter present, call bell in reach, and RN aware of pt's abilities.        Follow Up Recommendations  SNF     Equipment Recommendations  Rolling walker  with 5" wheels;3in1 (PT);Wheelchair (measurements PT);Wheelchair cushion (measurements PT);Other (comment) (pt wants crutches but PT recommend use of RW)    Recommendations for Other Services       Precautions / Restrictions Precautions Precautions: Fall;Knee Precaution Booklet Issued: No Precaution Comments: Wound vac Required Braces or Orthoses: Knee Immobilizer - Right Knee Immobilizer - Right: On at all times Restrictions Weight Bearing Restrictions: Yes RLE Weight Bearing: Partial weight bearing RLE Partial Weight Bearing Percentage or Pounds: 50%    Mobility  Bed Mobility Overal bed mobility: Needs Assistance Bed Mobility: Supine to Sit     Supine to sit: Min assist;HOB elevated Sit to supine: Supervision   General bed mobility comments: Min assist to achieve EOB short sit from long sitting. INcreased time to perform 2/2 to pain but pt was able to perform with only min assist.  Transfers Overall transfer level: Needs assistance Equipment used: None Transfers: Stand Pivot Transfers Sit to Stand: Min assist Stand pivot transfers: Min assist       General transfer comment: Pt unwilling to use RW. was able to stand pivot while maintaining NWB even though PWB. she is impulsive and only willing to perform things her way.  Ambulation/Gait             General Gait Details: unsafe/ pt unwilling to progress   Stairs             Wheelchair Mobility    Modified Rankin (Stroke Patients Only)       Balance Overall balance assessment:  Needs assistance Sitting-balance support: Feet supported Sitting balance-Leahy Scale: Good Sitting balance - Comments: no LOB in sitting        Standing balance comment: unable to assess, pt refuses mobilization citing pain                            Cognition Arousal/Alertness: Awake/alert Behavior During Therapy: Restless;Anxious;Impulsive Overall Cognitive Status: No family/caregiver present to determine  baseline cognitive functioning                                 General Comments: Oriented to self, time, place and some aspects of situatuion, but reports that she can't eat because she has another surgery today which is not evident from chart review.      Exercises Other Exercises Other Exercises: OT facilitates ed re: role of OT, safety considerations, importance of OOB activity, fall prevention, pain mgt including deep breathing. Pt with moderate understanding, requires f/u for OOB mobility and pain mgt techniques.    General Comments        Pertinent Vitals/Pain Pain Assessment: 0-10 Pain Score: 9  Faces Pain Scale: Hurts whole lot Pain Location: R LE pt reports 9/10 pain at rest, Pain Descriptors / Indicators: Constant;Discomfort;Penetrating;Sharp Pain Intervention(s): Limited activity within patient's tolerance;Monitored during session;Premedicated before session;Repositioned    Home Living Family/patient expects to be discharged to:: Private residence Living Arrangements: Spouse/significant other Available Help at Discharge: Family Type of Home: Mobile home Home Access: Stairs to enter Entrance Stairs-Rails: Left Home Layout: One level Home Equipment: Clinical cytogeneticist - 2 wheels;Cane - single point;Wheelchair - manual Additional Comments: endorses >5 falls in a year    Prior Function Level of Independence: Independent with assistive device(s)      Comments: Pt reports intermittent use of SPC   PT Goals (current goals can now be found in the care plan section) Acute Rehab PT Goals Patient Stated Goal: get pain under control Progress towards PT goals: Progressing toward goals    Frequency    7X/week      PT Plan Current plan remains appropriate    Co-evaluation              AM-PAC PT "6 Clicks" Mobility   Outcome Measure  Help needed turning from your back to your side while in a flat bed without using bedrails?: None Help needed  moving from lying on your back to sitting on the side of a flat bed without using bedrails?: A Little Help needed moving to and from a bed to a chair (including a wheelchair)?: A Little Help needed standing up from a chair using your arms (e.g., wheelchair or bedside chair)?: A Little Help needed to walk in hospital room?: A Lot Help needed climbing 3-5 steps with a railing? : A Lot 6 Click Score: 17    End of Session Equipment Utilized During Treatment: Gait belt Activity Tolerance: Patient limited by pain Patient left: in bed;with call bell/phone within reach;with bed alarm set;with SCD's reapplied Nurse Communication: Mobility status;Precautions PT Visit Diagnosis: Unsteadiness on feet (R26.81);Other abnormalities of gait and mobility (R26.89);Muscle weakness (generalized) (M62.81);Difficulty in walking, not elsewhere classified (R26.2);Pain Pain - Right/Left: Right Pain - part of body: Knee     Time: 0263-7858 PT Time Calculation (min) (ACUTE ONLY): 34 min  Charges:  $Therapeutic Activity: 23-37 mins  Julaine Fusi PTA 02/07/20, 1:00 PM

## 2020-02-07 NOTE — Progress Notes (Signed)
[place] INFECTIOUS DISEASE PROGRESS NOTE Date of Admission:  02/03/2020     ID: Rhonda Martin is a 44 y.o. female with PJI Principal Problem:   Sinus tachycardia Active Problems:   Chronic hepatitis C without hepatic coma (HCC)   Alcohol abuse   Tobacco abuse   Alcohol withdrawal delirium (HCC)   Knee pain, right   Sepsis (HCC)   COPD (chronic obstructive pulmonary disease) (HCC)   HTN (hypertension)   Hepatic encephalopathy (HCC)   S/P TKR (total knee replacement) using cement, right   Wound dehiscence, surgical, initial encounter   Subjective: Still with pain   ROS  Eleven systems are reviewed and negative except per hpi  Medications:  Antibiotics Given (last 72 hours)    Date/Time Action Medication Dose Rate   02/04/20 1830 New Bag/Given   piperacillin-tazobactam (ZOSYN) IVPB 3.375 g 3.375 g 12.5 mL/hr   02/04/20 1835 New Bag/Given   vancomycin (VANCOREADY) IVPB 1500 mg/300 mL 1,500 mg 150 mL/hr   02/05/20 0018 New Bag/Given   piperacillin-tazobactam (ZOSYN) IVPB 3.375 g 3.375 g 12.5 mL/hr   02/05/20 0645 New Bag/Given   vancomycin (VANCOREADY) IVPB 750 mg/150 mL 750 mg 150 mL/hr   02/05/20 1010 New Bag/Given   piperacillin-tazobactam (ZOSYN) IVPB 3.375 g 3.375 g 12.5 mL/hr   02/05/20 1939 New Bag/Given   ceFEPIme (MAXIPIME) 2 g in sodium chloride 0.9 % 100 mL IVPB 2 g 200 mL/hr   02/05/20 2149 New Bag/Given   vancomycin (VANCOREADY) IVPB 750 mg/150 mL 750 mg 150 mL/hr   02/06/20 0657 New Bag/Given   ceFEPIme (MAXIPIME) 2 g in sodium chloride 0.9 % 100 mL IVPB 2 g 200 mL/hr   02/06/20 1600 New Bag/Given   ceFEPIme (MAXIPIME) 2 g in sodium chloride 0.9 % 100 mL IVPB 2 g 200 mL/hr   02/06/20 1630 New Bag/Given   vancomycin (VANCOREADY) IVPB 750 mg/150 mL 750 mg 150 mL/hr   02/06/20 2123 New Bag/Given   ceFEPIme (MAXIPIME) 2 g in sodium chloride 0.9 % 100 mL IVPB 2 g 200 mL/hr   02/07/20 0309 New Bag/Given   vancomycin (VANCOREADY) IVPB 750 mg/150 mL 750 mg  150 mL/hr   02/07/20 0602 New Bag/Given   ceFEPIme (MAXIPIME) 2 g in sodium chloride 0.9 % 100 mL IVPB 2 g 200 mL/hr     . dicyclomine  20 mg Oral TID AC & HS  . docusate sodium  100 mg Oral BID  . DULoxetine  20 mg Oral Daily  . ferrous sulfate  325 mg Oral Q breakfast  . [START ON 02/08/2020] fluconazole  400 mg Oral Daily  . folic acid  1 mg Oral Daily  . furosemide  20 mg Oral Daily  . lactulose  30 g Oral TID  . magnesium oxide  400 mg Oral Daily  . metoprolol succinate  12.5 mg Oral Daily  . multivitamin with minerals  1 tablet Oral Daily  . OLANZapine  10 mg Oral QHS  . oxyCODONE  10 mg Oral Q12H  . pantoprazole  40 mg Oral Daily  . [START ON 02/08/2020] potassium chloride  20 mEq Oral Daily  . senna  1 tablet Oral BID  . spironolactone  25 mg Oral Daily  . sucralfate  1 g Oral TID WC & HS  . thiamine  100 mg Oral Daily   Or  . thiamine  100 mg Intravenous Daily  . traMADol  50 mg Oral Q6H  . vitamin B-12  500 mcg Oral  Daily    Objective: Vital signs in last 24 hours: Temp:  [98.7 F (37.1 C)-100 F (37.8 C)] 99.1 F (37.3 C) (08/31 2345) Pulse Rate:  [105-109] 109 (08/31 2345) Resp:  [16-20] 20 (08/31 2345) BP: (113-116)/(60-71) 115/61 (08/31 2345) SpO2:  [97 %-98 %] 97 % (08/31 2345) Constitutional: She is lethargic HENT: anicteric Mouth/Throat: Oropharynx is clear and moist. No oropharyngeal exudate.  Cardiovascular: Normal rate, regular rhythm and normal heart sounds.Pulmonary/Chest: Effort normal and breath sounds normal. No respiratory distress. He has no wheezes.  Abdominal: Soft. Bowel sounds are normal. He exhibits no distension. There is no tenderness.  Lymphadenopathy: He has no cervical adenopathy.  Neurological: lethargic Skin: Skin is warm and dry. No rash noted. No erythema.  Ext R knee in immobilize Psychiatric: He has a normal mood and affect. His behavior is normal.   Lab Results Recent Labs    02/06/20 0227 02/07/20 0502  WBC 5.8 5.1  HGB  6.5* 7.1*  HCT 19.5* 22.0*  NA 140 138  K 3.7 3.5  CL 109 110  CO2 27 23  BUN 10 7  CREATININE 0.41* 0.42*    Microbiology: Results for orders placed or performed during the hospital encounter of 02/03/20  Blood culture (routine x 2)     Status: None (Preliminary result)   Collection Time: 02/03/20  9:48 AM   Specimen: BLOOD  Result Value Ref Range Status   Specimen Description BLOOD RFA  Final   Special Requests   Final    BOTTLES DRAWN AEROBIC AND ANAEROBIC Blood Culture adequate volume   Culture   Final    NO GROWTH 4 DAYS Performed at [facility], [address], [place], [place] [address]    Report Status PENDING  Incomplete  Blood culture (routine x 2)     Status: None (Preliminary result)   Collection Time: 02/03/20  9:50 AM   Specimen: BLOOD  Result Value Ref Range Status   Specimen Description BLOOD RT UPPER ARM  Final   Special Requests   Final    BOTTLES DRAWN AEROBIC AND ANAEROBIC Blood Culture results may not be optimal due to an inadequate volume of blood received in culture bottles   Culture   Final    NO GROWTH 4 DAYS Performed at [facility], [address] [place], [place] [address]    Report Status PENDING  Incomplete  SARS Coronavirus 2 by RT PCR (hospital order, performed in [place] hospital lab) Nasopharyngeal Nasopharyngeal Swab     Status: None   Collection Time: 02/03/20  9:51 AM   Specimen: Nasopharyngeal Swab  Result Value Ref Range Status   SARS Coronavirus 2 NEGATIVE NEGATIVE Final    Comment: (NOTE) SARS-CoV-2 target nucleic acids are NOT DETECTED.  The SARS-CoV-2 RNA is generally detectable in upper and lower respiratory specimens during the acute phase of infection. The lowest concentration of SARS-CoV-2 viral copies this assay can detect is 250 copies / mL. A negative result does not preclude SARS-CoV-2 infection and should not be used as the sole basis for treatment or other patient management decisions.  A  negative result may occur with improper specimen collection / handling, submission of specimen other than nasopharyngeal swab, presence of viral mutation(s) within the areas targeted by this assay, and inadequate number of viral copies (<250 copies / mL). A negative result must be combined with clinical observations, patient history, and epidemiological information.  Fact Sheet for Patients:   StrictlyIdeas.no  Fact Sheet for  Healthcare Providers: BankingDealers.co.za  This test is not yet approved or  cleared by the [place] and has been authorized for detection and/or diagnosis of SARS-CoV-2 by FDA under an Emergency Use Authorization (EUA).  This EUA will remain in effect (meaning this test can be used) for the duration of the COVID-19 declaration under Section 564(b)(1) of the Act, 21 U.S.C. section 360bbb-3(b)(1), unless the authorization is terminated or revoked sooner.  Performed at [facility], [address] [place], [place] [address]   Aerobic/Anaerobic Culture (surgical/deep wound)     Status: None (Preliminary result)   Collection Time: 02/04/20  9:23 AM   Specimen: PATH Other; Tissue  Result Value Ref Range Status   Specimen Description   Final    KNEE IRRIGATION AND DEBRIDEMENT RIGHT KNEE Performed at [facility], [address] [place], [place] [address]    Special Requests   Final    NONE Performed at [facility], [address] [place], [place] [address]    Gram Stain   Final    NO WBC SEEN NO ORGANISMS SEEN Performed at [facility], [place] [address], [place], [place] [address]    Culture   Final    RARE STAPHYLOCOCCUS EPIDERMIDIS RARE YEAST IDENTIFICATION AND SUSCEPTIBILITIES TO FOLLOW NO ANAEROBES ISOLATED; CULTURE IN PROGRESS FOR 5 DAYS    Report Status PENDING  Incomplete  Aerobic/Anaerobic Culture (surgical/deep wound)     Status: None (Preliminary  result)   Collection Time: 02/04/20  9:23 AM   Specimen: PATH Other; Tissue  Result Value Ref Range Status   Specimen Description   Final    KNEE Performed at [facility], [address] [place], [place] [address]    Special Requests   Final    KNEE RIGHT KNEE Performed at [facility], [address] [place], [place] [address]    Gram Stain   Final    FEW WBC PRESENT, PREDOMINANTLY MONONUCLEAR NO ORGANISMS SEEN Performed at [facility], [place] [address], [place], [place] [address]    Culture   Final    RARE STAPHYLOCOCCUS EPIDERMIDIS RARE YEAST IDENTIFICATION AND SUSCEPTIBILITIES TO FOLLOW NO ANAEROBES ISOLATED; CULTURE IN PROGRESS FOR 5 DAYS    Report Status PENDING  Incomplete    Studies/Results: No results found.  Assessment/Plan: Rhonda Martin is a 44 y.o. female who under R TKA by Dr. Rudene Christians on 01/11/20. Patient was discharged home on 01/16/20 due to patient preference despite PT recommendations for SNF. She was seen for 2 week postop visit on 01/24/20 by Rachelle Hora, PA, at which time her incision was intact and there was mild drainage. Half of the staples were removed and patient was started on PO Abx. She presented to ED on 01/28/20 due to bleeding from the incision with opening at the superior aspect. She did not stay for full MD evaluation. She was then re-evaluated by ChrisGaines on 01/30/20. There was noted dehiscence of the midportion of the incision at that time. Patient had increased pain as well. Wound VAC was set up for patient with nursing for [facility] application to be performed that day. The patient apparently removed the wound VAC and presented to ED on 8/28 On 8/29 s/p TKR revision, removal of hardware and placement of abx beads. Cxs sent  Recommendations Continue vancomycin. Continue cefepime Add fluconazole for yeast No plans for reimplantation. Hopefully can use oral agents for prolonged course. Hesitant to do picc given hx narcotic  use.  Thank you very much for the consult. Will follow with you.  Leonel Ramsay   02/07/2020, 2:52 PM

## 2020-02-07 NOTE — Progress Notes (Signed)
PROGRESS NOTE    Rhonda Martin  ASN:[id number] DOB: [birth year] DOA: 02/03/2020 PCP: Volney American, PA-C    Chief Complaint  Patient presents with  . Code Sepsis    Brief Narrative:  44 year old with history of polysubstance abuse, alcohol use, hepatitis C with cirrhosis underwentRight total knee arthroplasty 3 weeks prior to admissionWith poor follow-up presented to the hospital a week prior to admission for drainage from the incision site on 8/22.  He ended up leaving AMA.  And followed up outpatient with orthopedic office and wound VAC was placed on 8/24 which apparently patient removed on 8/26.  Now coming to the ER with signs of sepsis and worsening of wound infection.  Patient was taken to the OR on 8/29 and found to have completely ruptured patella.  Vancomycin beads were placed, capsule was repaired along with knee immobilizer.  Infectious disease was consulted for their input.    Assessment & Plan:   Principal Problem:   Sinus tachycardia Active Problems:   Chronic hepatitis C without hepatic coma (HCC)   Alcohol abuse   Tobacco abuse   Alcohol withdrawal delirium (HCC)   Knee pain, right   Sepsis (HCC)   COPD (chronic obstructive pulmonary disease) (HCC)   HTN (hypertension)   Hepatic encephalopathy (HCC)   S/P TKR (total knee replacement) using cement, right   Wound dehiscence, surgical, initial encounter  1 sepsis secondary to right knee wound infection after recent right total knee arthroplasty/medical noncompliance Patient had presented with sepsis physiology noted to be improving however still with some sinus tachycardia. Orthopedics consulted and patient subsequently underwent surgery by orthopedics 02/04/2020 for infected right knee with ruptured patella tendon status post knee revision, irrigation and debridement.  Wound VAC in place.  Blood cultures with no growth to date.  Wound cultures with preliminary results of rare Staph epidermidis, rare yeast.   Patient was on IV Zosyn and was changed to IV cefepime.  Continue IV vancomycin.  Diflucan added to regimen.  Continue current pain regimen of OxyContin, oxycodone IR every 6 hours as needed.  IV morphine has been discontinued.  ID following.  Orthopedics following.  2.  Anemia of unknown etiology Likely postop acute blood loss anemia.  Hemoglobin went as low as 6.5.  Status post transfusion 1 unit packed red blood cells 02/06/2020.  Hemoglobin currently at 7.1.  Follow H&H.  Check an anemia panel.  Transfusion threshold hemoglobin < 7.  Follow.  3.  History of liver cirrhosis/polysubstance abuse/alcohol use/hepatitis C Alcohol cessation stressed to patient.  Continue current regimen of lactulose, Lasix, Aldactone, folic acid, multivitamin, thiamine.  Unsure of previous hepatitis C treatment however that can be followed in the outpatient setting with ID.  Follow.  4.  History of COPD Stable.  Bronchodilators as needed.  5.  Hypertension Continue current regimen of Lasix, Toprol-XL, Aldactone.   DVT prophylaxis: SCDs Code Status: Full Family Communication: Updated patient.  No family at bedside. Disposition:   Status is: Inpatient    Dispo: The patient is from: Home              Anticipated d/c is to: Home with home health versus SNF (patient stating cannot go to SNF as stable take her paycheck)              Anticipated d/c date is: To be determined              Patient currently underwent right total knee revision, on IV antibiotics, cultures pending,  ID and orthopedics following.  Not stable for discharge.       Consultants:   Infectious disease: Dr. Ola Spurr 02/05/2020  Orthopedics: Dr. Rudene Christians 02/03/2020  Procedures:   Plain films of the right knee 02/03/2020, 02/04/2020  Right total knee revision, irrigation and debridement Dr. Rudene Christians 02/04/2020  Transfusion 1 unit packed red blood cells 02/06/2020  Antimicrobials:   IV cefepime 02/05/2020>>>>  IV Diflucan  02/07/2020>>>  IV vancomycin 02/03/2020>>>>>   Subjective: Patient laying in bed.  Seems more alert today.  Complaining of right knee pain.  No chest pain or shortness of breath.  Objective: Vitals:   02/06/20 1554 02/06/20 2114 02/06/20 2345 02/07/20 1540  BP: 113/71 116/60 115/61 126/60  Pulse: (!) 105 (!) 109 (!) 109 (!) 110  Resp: 16 18 20 16   Temp: 98.7 F (37.1 C) 100 F (37.8 C) 99.1 F (37.3 C) 99.5 F (37.5 C)  TempSrc: Oral Oral Oral Oral  SpO2:  98% 97% 95%  Weight:      Height:        Intake/Output Summary (Last 24 hours) at 02/07/2020 1731 Last data filed at 02/07/2020 1415 Gross per 24 hour  Intake 2380.89 ml  Output 1600 ml  Net 780.89 ml   Filed Weights   02/03/20 0939  Weight: 63.5 kg    Examination:  General exam: NAD Respiratory system: Clear to auscultation. Respiratory effort normal. Cardiovascular system: Tachycardia. No JVD, murmurs, rubs, gallops or clicks. No pedal edema. Gastrointestinal system: Abdomen is nondistended, soft and nontender. No organomegaly or masses felt. Normal bowel sounds heard. Central nervous system: Alert and oriented. No focal neurological deficits. Extremities: Right lower extremity with Praveena intact and brace/knee immobilizer noted. Skin: No rashes, lesions or ulcers Psychiatry: Judgement and insight appear normal. Mood & affect appropriate.     Data Reviewed: I have personally reviewed following labs and imaging studies  CBC: Recent Labs  Lab 02/03/20 0951 02/04/20 1646 02/05/20 0450 02/06/20 0227 02/07/20 0502  WBC 5.3 6.3 5.2 5.8 5.1  NEUTROABS 3.9  --   --   --   --   HGB 8.6* 8.3* 7.0* 6.5* 7.1*  HCT 26.4* 26.3* 21.8* 19.5* 22.0*  MCV 82.8 84.0 83.2 81.9 85.3  PLT 173 155 139* 129* 107*    Basic Metabolic Panel: Recent Labs  Lab 02/03/20 0951 02/04/20 1646 02/05/20 0450 02/06/20 0227 02/07/20 0502  NA 133*  --  137 140 138  K 3.8  --  3.7 3.7 3.5  CL 98  --  107 109 110  CO2 24  --  25  27 23   GLUCOSE 132*  --  153* 115* 116*  BUN 6  --  9 10 7   CREATININE 0.47  --  0.46 0.41* 0.42*  CALCIUM 8.4*  --  8.4* 7.6* 7.3*  MG 1.7 1.6*  --  1.7 1.7  PHOS  --  4.4  --   --   --     GFR: Estimated Creatinine Clearance: 78.6 mL/min (A) (by C-G formula based on SCr of 0.42 mg/dL (L)).  Liver Function Tests: Recent Labs  Lab 02/03/20 0951 02/05/20 0450  AST 39 33  ALT 20 18  ALKPHOS 106 87  BILITOT 1.4* 1.2  PROT 7.1 6.2*  ALBUMIN 2.9* 2.4*    CBG: Recent Labs  Lab 02/06/20 1159  GLUCAP 131*     Recent Results (from the past 240 hour(s))  Blood culture (routine x 2)     Status: None (Preliminary result)  Collection Time: 02/03/20  9:48 AM   Specimen: BLOOD  Result Value Ref Range Status   Specimen Description BLOOD RFA  Final   Special Requests   Final    BOTTLES DRAWN AEROBIC AND ANAEROBIC Blood Culture adequate volume   Culture   Final    NO GROWTH 4 DAYS Performed at [facility], [address], [place], [place] [address]    Report Status PENDING  Incomplete  Blood culture (routine x 2)     Status: None (Preliminary result)   Collection Time: 02/03/20  9:50 AM   Specimen: BLOOD  Result Value Ref Range Status   Specimen Description BLOOD RT UPPER ARM  Final   Special Requests   Final    BOTTLES DRAWN AEROBIC AND ANAEROBIC Blood Culture results may not be optimal due to an inadequate volume of blood received in culture bottles   Culture   Final    NO GROWTH 4 DAYS Performed at [facility], [address] [place], [place] [address]    Report Status PENDING  Incomplete  SARS Coronavirus 2 by RT PCR (hospital order, performed in [place] hospital lab) Nasopharyngeal Nasopharyngeal Swab     Status: None   Collection Time: 02/03/20  9:51 AM   Specimen: Nasopharyngeal Swab  Result Value Ref Range Status   SARS Coronavirus 2 NEGATIVE NEGATIVE Final    Comment: (NOTE) SARS-CoV-2 target nucleic acids are NOT DETECTED.  The  SARS-CoV-2 RNA is generally detectable in upper and lower respiratory specimens during the acute phase of infection. The lowest concentration of SARS-CoV-2 viral copies this assay can detect is 250 copies / mL. A negative result does not preclude SARS-CoV-2 infection and should not be used as the sole basis for treatment or other patient management decisions.  A negative result may occur with improper specimen collection / handling, submission of specimen other than nasopharyngeal swab, presence of viral mutation(s) within the areas targeted by this assay, and inadequate number of viral copies (<250 copies / mL). A negative result must be combined with clinical observations, patient history, and epidemiological information.  Fact Sheet for Patients:   StrictlyIdeas.no  Fact Sheet for Healthcare Providers: BankingDealers.co.za  This test is not yet approved or  cleared by the [place] FDA and has been authorized for detection and/or diagnosis of SARS-CoV-2 by FDA under an Emergency Use Authorization (EUA).  This EUA will remain in effect (meaning this test can be used) for the duration of the COVID-19 declaration under Section 564(b)(1) of the Act, 21 U.S.C. section 360bbb-3(b)(1), unless the authorization is terminated or revoked sooner.  Performed at [facility], [address] [place], [place] [address]   Aerobic/Anaerobic Culture (surgical/deep wound)     Status: None (Preliminary result)   Collection Time: 02/04/20  9:23 AM   Specimen: PATH Other; Tissue  Result Value Ref Range Status   Specimen Description   Final    KNEE IRRIGATION AND DEBRIDEMENT RIGHT KNEE Performed at [facility], [address], [place], [place] [address]    Special Requests   Final    NONE Performed at [facility], [address] [place], [place] [address]    Gram Stain   Final    NO WBC SEEN NO ORGANISMS  SEEN Performed at [facility], [place] [address], [place], [place] [address]    Culture   Final    RARE STAPHYLOCOCCUS EPIDERMIDIS RARE YEAST IDENTIFICATION AND SUSCEPTIBILITIES TO FOLLOW NO ANAEROBES ISOLATED;  CULTURE IN PROGRESS FOR 5 DAYS    Report Status PENDING  Incomplete  Aerobic/Anaerobic Culture (surgical/deep wound)     Status: None (Preliminary result)   Collection Time: 02/04/20  9:23 AM   Specimen: PATH Other; Tissue  Result Value Ref Range Status   Specimen Description   Final    KNEE Performed at [facility], [address] [place], [place] [address]    Special Requests   Final    KNEE RIGHT KNEE Performed at [facility], [address] [place], [place] [address]    Gram Stain   Final    FEW WBC PRESENT, PREDOMINANTLY MONONUCLEAR NO ORGANISMS SEEN Performed at [facility], [place] [address] [place], [place] [address]    Culture   Final    RARE STAPHYLOCOCCUS EPIDERMIDIS RARE YEAST IDENTIFICATION AND SUSCEPTIBILITIES TO FOLLOW NO ANAEROBES ISOLATED; CULTURE IN PROGRESS FOR 5 DAYS    Report Status PENDING  Incomplete         Radiology Studies: No results found.      Scheduled Meds: . dicyclomine  20 mg Oral TID AC & HS  . docusate sodium  100 mg Oral BID  . DULoxetine  20 mg Oral Daily  . ferrous sulfate  325 mg Oral Q breakfast  . [START ON 02/08/2020] fluconazole  400 mg Oral Daily  . folic acid  1 mg Oral Daily  . furosemide  20 mg Oral Daily  . lactulose  30 g Oral TID  . magnesium oxide  400 mg Oral Daily  . metoprolol succinate  12.5 mg Oral Daily  . multivitamin with minerals  1 tablet Oral Daily  . OLANZapine  10 mg Oral QHS  . oxyCODONE  10 mg Oral Q12H  . pantoprazole  40 mg Oral Daily  . [START ON 02/08/2020] potassium chloride  20 mEq Oral Daily  . senna  1 tablet Oral BID  . spironolactone  25 mg Oral Daily  . sucralfate  1 g Oral TID WC & HS  . thiamine  100 mg Oral Daily   Or  . thiamine  100  mg Intravenous Daily  . traMADol  50 mg Oral Q6H  . vitamin B-12  500 mcg Oral Daily   Continuous Infusions: . sodium chloride 75 mL/hr at 02/06/20 2122  . ceFEPime (MAXIPIME) IV 2 g (02/07/20 1607)  . fluconazole (DIFLUCAN) IV     Followed by  . fluconazole (DIFLUCAN) IV    . vancomycin 750 mg (02/07/20 0309)     LOS: 4 days    Time spent: 40 minutes    Irine Seal, MD [facility]   To contact the attending provider between 7A-7P or the covering provider during after hours 7P-7A, please log into the web site [email or web address] and access using universal [place] password for that web site. If you do not have the password, please call the hospital operator.  02/07/2020, 5:31 PM

## 2020-02-08 LAB — MAGNESIUM: Magnesium: 2 mg/dL (ref 1.7–2.4)

## 2020-02-08 LAB — CBC
HCT: 22.8 % — ABNORMAL LOW (ref 36.0–46.0)
Hemoglobin: 7.5 g/dL — ABNORMAL LOW (ref 12.0–15.0)
MCH: 27.1 pg (ref 26.0–34.0)
MCHC: 32.9 g/dL (ref 30.0–36.0)
MCV: 82.3 fL (ref 80.0–100.0)
Platelets: 121 10*3/uL — ABNORMAL LOW (ref 150–400)
RBC: 2.77 MIL/uL — ABNORMAL LOW (ref 3.87–5.11)
RDW: 18.2 % — ABNORMAL HIGH (ref 11.5–15.5)
WBC: 5.7 10*3/uL (ref 4.0–10.5)
nRBC: 0 % (ref 0.0–0.2)

## 2020-02-08 LAB — CULTURE, BLOOD (ROUTINE X 2)
Culture: NO GROWTH
Culture: NO GROWTH
Special Requests: ADEQUATE

## 2020-02-08 LAB — BASIC METABOLIC PANEL
Anion gap: 6 (ref 5–15)
BUN: 8 mg/dL (ref 6–20)
CO2: 26 mmol/L (ref 22–32)
Calcium: 7.4 mg/dL — ABNORMAL LOW (ref 8.9–10.3)
Chloride: 106 mmol/L (ref 98–111)
Creatinine, Ser: 0.39 mg/dL — ABNORMAL LOW (ref 0.44–1.00)
GFR calc Af Amer: >60 mL/min (ref >60)
GFR calc non Af Amer: >60 mL/min (ref >60)
Glucose, Bld: 109 mg/dL — ABNORMAL HIGH (ref 70–99)
Potassium: 3.3 mmol/L — ABNORMAL LOW (ref 3.5–5.1)
Sodium: 138 mmol/L (ref 135–145)

## 2020-02-08 LAB — VANCOMYCIN, TROUGH: Vancomycin Tr: 6 ug/mL — ABNORMAL LOW (ref 15–20)

## 2020-02-08 MED ORDER — POTASSIUM CHLORIDE CRYS ER 20 MEQ PO TBCR
40.0000 meq | EXTENDED_RELEASE_TABLET | Freq: Once | ORAL | Status: AC
  Administered 2020-02-08: 40 meq via ORAL
  Filled 2020-02-08: qty 2

## 2020-02-08 MED ORDER — IBUPROFEN 400 MG PO TABS
200.0000 mg | ORAL_TABLET | Freq: Four times a day (QID) | ORAL | Status: DC | PRN
  Administered 2020-02-08 (×2): 200 mg via ORAL
  Filled 2020-02-08 (×2): qty 1

## 2020-02-08 NOTE — Progress Notes (Signed)
PROGRESS NOTE    Rhonda Martin  KYH:[id number] DOB: 1975-11-20 DOA: 02/03/2020 PCP: Volney American, PA-C    Chief Complaint  Patient presents with  . Code Sepsis    Brief Narrative:  44 year old with history of polysubstance abuse, alcohol use, hepatitis C with cirrhosis underwentRight total knee arthroplasty 3 weeks prior to admissionWith poor follow-up presented to the hospital a week prior to admission for drainage from the incision site on 8/22.  He ended up leaving AMA.  And followed up outpatient with orthopedic office and wound VAC was placed on 8/24 which apparently patient removed on 8/26.  Now coming to the ER with signs of sepsis and worsening of wound infection.  Patient was taken to the OR on 8/29 and found to have completely ruptured patella.  Vancomycin beads were placed, capsule was repaired along with knee immobilizer.  Infectious disease was consulted for their input.    Assessment & Plan:   Principal Problem:   Sinus tachycardia Active Problems:   Chronic hepatitis C without hepatic coma (HCC)   Alcohol abuse   Tobacco abuse   Alcohol withdrawal delirium (HCC)   Knee pain, right   Sepsis (HCC)   COPD (chronic obstructive pulmonary disease) (HCC)   HTN (hypertension)   Hepatic encephalopathy (HCC)   S/P TKR (total knee replacement) using cement, right   Wound dehiscence, surgical, initial encounter   Anemia  1 sepsis secondary to right knee wound infection after recent right total knee arthroplasty/medical noncompliance Patient had presented with sepsis physiology noted to be improving however still with some sinus tachycardia. Orthopedics consulted and patient subsequently underwent surgery by orthopedics 02/04/2020 for infected right knee with ruptured patella tendon status post knee revision, irrigation and debridement.  Wound VAC in place.  Blood cultures with no growth to date.  Wound cultures with rare Staph epidermidis, rare yeast.  Patient was on  IV Zosyn and was changed to IV cefepime.  Continue IV vancomycin.  Diflucan added to regimen.  Continue current pain regimen of OxyContin, oxycodone IR every 6 hours as needed.  IV morphine has been discontinued.  ID following and recommending continuation of Vanco and oral fluconazole while inpatient, could discontinue cefepime and recommended on discharge oral doxycycline 100 mg twice daily x6 weeks as well as oral fluconazole 400 mg daily for 6 weeks.  Will need outpatient follow-up with ID and orthopedics.  2.  Anemia of unknown etiology Likely postop acute blood loss anemia.  Hemoglobin went as low as 6.5.  Status post transfusion 1 unit packed red blood cells 02/06/2020.  Hemoglobin currently at 7.5.  Follow H&H.  Anemia panel consistent with anemia of chronic disease as well as iron deficient.  Will need oral iron supplementation on discharge.   3.  History of liver cirrhosis/polysubstance abuse/alcohol use/hepatitis C Alcohol cessation stressed to patient.  Continue current regimen of lactulose, Lasix, Aldactone, folic acid, multivitamin, thiamine.  Unsure of previous hepatitis C treatment however that can be followed in the outpatient setting with ID.  Follow.  4.  History of COPD Stable.  Bronchodilators as needed.  5.  Hypertension Stable on current regimen of Lasix, Toprol-XL, Aldactone.  Follow.  6.  Hypokalemia Replete.   DVT prophylaxis: SCDs Code Status: Full Family Communication: Updated patient and husband at bedside. Disposition:   Status is: Inpatient    Dispo: The patient is from: Home              Anticipated d/c is to: Home with home  health versus SNF (patient stating cannot go to SNF as stable take her paycheck)              Anticipated d/c date is: To be determined              Patient currently underwent right total knee revision, on IV antibiotics, cultures pending, ID and orthopedics following.  Not stable for discharge.       Consultants:   Infectious  disease: Dr. Ola Spurr 02/05/2020  Orthopedics: Dr. Rudene Christians 02/03/2020  Procedures:   Plain films of the right knee 02/03/2020, 02/04/2020  Right total knee revision, irrigation and debridement Dr. Rudene Christians 02/04/2020  Transfusion 1 unit packed red blood cells 02/06/2020  Antimicrobials:   IV cefepime 02/05/2020>>>>  IV Diflucan 02/07/2020>>>  IV vancomycin 02/03/2020>>>>>   Subjective: Patient sitting up in chair.  No chest pain.  No shortness of breath.  Thinking she may need to go home with a PICC line.  Patient seems resistant to going to a facility.  Asking for increased pain medications per RN.  Objective: Vitals:   02/07/20 1540 02/08/20 0001 02/08/20 0156 02/08/20 0834  BP: 126/60 127/66  116/62  Pulse: (!) 110 99  88  Resp: 16 16  16   Temp: 99.5 F (37.5 C) (!) 100.7 F (38.2 C) 99.2 F (37.3 C) 98.7 F (37.1 C)  TempSrc: Oral Oral Oral Oral  SpO2: 95% 97%  100%  Weight:      Height:        Intake/Output Summary (Last 24 hours) at 02/08/2020 1323 Last data filed at 02/08/2020 0951 Gross per 24 hour  Intake 480 ml  Output 2250 ml  Net -1770 ml   Filed Weights   02/03/20 0939  Weight: 63.5 kg    Examination:  General exam: NAD Respiratory system: CTAB.  No wheezes, no crackles, no rhonchi.  Normal respiratory effort.  Cardiovascular system: Tachycardia.  No JVD murmurs or rubs.  No lower extremity edema. Gastrointestinal system: Abdomen is soft, nondistended, nontender, positive bowel sounds.  No rebound.  No guarding. Central nervous system: Alert and oriented. No focal neurological deficits. Extremities: Right lower extremity with Praveena intact and brace/knee immobilizer noted. Skin: No rashes, lesions or ulcers Psychiatry: Judgement and insight appear normal. Mood & affect appropriate.     Data Reviewed: I have personally reviewed following labs and imaging studies  CBC: Recent Labs  Lab 02/03/20 0951 02/03/20 0951 02/04/20 1646 02/05/20 0450  02/06/20 0227 02/07/20 0502 02/08/20 0337  WBC 5.3   < > 6.3 5.2 5.8 5.1 5.7  NEUTROABS 3.9  --   --   --   --   --   --   HGB 8.6*   < > 8.3* 7.0* 6.5* 7.1* 7.5*  HCT 26.4*   < > 26.3* 21.8* 19.5* 22.0* 22.8*  MCV 82.8   < > 84.0 83.2 81.9 85.3 82.3  PLT 173   < > 155 139* 129* 107* 121*   < > = values in this interval not displayed.    Basic Metabolic Panel: Recent Labs  Lab 02/03/20 0951 02/04/20 1646 02/05/20 0450 02/06/20 0227 02/07/20 0502 02/08/20 0337  NA 133*  --  137 140 138 138  K 3.8  --  3.7 3.7 3.5 3.3*  CL 98  --  107 109 110 106  CO2 24  --  25 27 23 26   GLUCOSE 132*  --  153* 115* 116* 109*  BUN 6  --  9 10 7  8  CREATININE 0.47  --  0.46 0.41* 0.42* 0.39*  CALCIUM 8.4*  --  8.4* 7.6* 7.3* 7.4*  MG 1.7 1.6*  --  1.7 1.7 2.0  PHOS  --  4.4  --   --   --   --     GFR: Estimated Creatinine Clearance: 78.6 mL/min (A) (by C-G formula based on SCr of 0.39 mg/dL (L)).  Liver Function Tests: Recent Labs  Lab 02/03/20 0951 02/05/20 0450  AST 39 33  ALT 20 18  ALKPHOS 106 87  BILITOT 1.4* 1.2  PROT 7.1 6.2*  ALBUMIN 2.9* 2.4*    CBG: Recent Labs  Lab 02/06/20 1159  GLUCAP 131*     Recent Results (from the past 240 hour(s))  Blood culture (routine x 2)     Status: None   Collection Time: 02/03/20  9:48 AM   Specimen: BLOOD  Result Value Ref Range Status   Specimen Description BLOOD RFA  Final   Special Requests   Final    BOTTLES DRAWN AEROBIC AND ANAEROBIC Blood Culture adequate volume   Culture   Final    NO GROWTH 5 DAYS Performed at [facility], [address] [place], [place] [address]    Report Status 02/08/2020 FINAL  Final  Blood culture (routine x 2)     Status: None   Collection Time: 02/03/20  9:50 AM   Specimen: BLOOD  Result Value Ref Range Status   Specimen Description BLOOD RT UPPER ARM  Final   Special Requests   Final    BOTTLES DRAWN AEROBIC AND ANAEROBIC Blood Culture results may not be optimal due to an  inadequate volume of blood received in culture bottles   Culture   Final    NO GROWTH 5 DAYS Performed at [facility], [address], [place], [place] [address]    Report Status 02/08/2020 FINAL  Final  SARS Coronavirus 2 by RT PCR (hospital order, performed in [facility] hospital lab) Nasopharyngeal Nasopharyngeal Swab     Status: None   Collection Time: 02/03/20  9:51 AM   Specimen: Nasopharyngeal Swab  Result Value Ref Range Status   SARS Coronavirus 2 NEGATIVE NEGATIVE Final    Comment: (NOTE) SARS-CoV-2 target nucleic acids are NOT DETECTED.  The SARS-CoV-2 RNA is generally detectable in upper and lower respiratory specimens during the acute phase of infection. The lowest concentration of SARS-CoV-2 viral copies this assay can detect is 250 copies / mL. A negative result does not preclude SARS-CoV-2 infection and should not be used as the sole basis for treatment or other patient management decisions.  A negative result may occur with improper specimen collection / handling, submission of specimen other than nasopharyngeal swab, presence of viral mutation(s) within the areas targeted by this assay, and inadequate number of viral copies (<250 copies / mL). A negative result must be combined with clinical observations, patient history, and epidemiological information.  Fact Sheet for Patients:   StrictlyIdeas.no  Fact Sheet for Healthcare Providers: BankingDealers.co.za  This test is not yet approved or  cleared by the [place] FDA and has been authorized for detection and/or diagnosis of SARS-CoV-2 by FDA under an Emergency Use Authorization (EUA).  This EUA will remain in effect (meaning this test can be used) for the duration of the COVID-19 declaration under Section 564(b)(1) of the Act, 21 U.S.C. section 360bbb-3(b)(1), unless the authorization is terminated or revoked sooner.  Performed at [facility], [address] [place], [place] [address]   Aerobic/Anaerobic Culture (surgical/deep wound)     Status: None (Preliminary result)   Collection Time: 02/04/20  9:23 AM   Specimen: PATH Other; Tissue  Result Value Ref Range Status   Specimen Description   Final    KNEE IRRIGATION AND DEBRIDEMENT RIGHT KNEE Performed at [facility], [address], [place], [place] [address]    Special Requests   Final    NONE Performed at [facility], [address] [place], [place] [address]    Gram Stain   Final    NO WBC SEEN NO ORGANISMS SEEN Performed at [facility], [place] [address] [place], [place] [address]    Culture   Final    RARE STAPHYLOCOCCUS EPIDERMIDIS RARE CANDIDA PARAPSILOSIS NO ANAEROBES ISOLATED; CULTURE IN PROGRESS FOR 5 DAYS    Report Status PENDING  Incomplete   Organism ID, Bacteria STAPHYLOCOCCUS EPIDERMIDIS  Final      Susceptibility   Staphylococcus epidermidis - MIC*    CIPROFLOXACIN <=0.5 SENSITIVE Sensitive     ERYTHROMYCIN >=8 RESISTANT Resistant     GENTAMICIN <=0.5 SENSITIVE Sensitive     OXACILLIN >=4 RESISTANT Resistant     TETRACYCLINE 2 SENSITIVE Sensitive     VANCOMYCIN 1 SENSITIVE Sensitive     TRIMETH/SULFA 160 RESISTANT Resistant     CLINDAMYCIN <=0.25 SENSITIVE Sensitive     RIFAMPIN <=0.5 SENSITIVE Sensitive     Inducible Clindamycin NEGATIVE Sensitive     * RARE STAPHYLOCOCCUS EPIDERMIDIS  Aerobic/Anaerobic Culture (surgical/deep wound)     Status: None (Preliminary result)   Collection Time: 02/04/20  9:23 AM   Specimen: PATH Other; Tissue  Result Value Ref Range Status   Specimen Description   Final    KNEE Performed at [facility], [address], [place], [place] [address]    Special Requests   Final    KNEE RIGHT KNEE Performed at [facility], [address] [place], [place] [address]    Gram Stain   Final    FEW WBC PRESENT, PREDOMINANTLY MONONUCLEAR NO ORGANISMS  SEEN Performed at [facility], [place] [address], [place], [place] [address]    Culture   Final    RARE STAPHYLOCOCCUS EPIDERMIDIS RARE YEAST IDENTIFICATION TO FOLLOW NO ANAEROBES ISOLATED; CULTURE IN PROGRESS FOR 5 DAYS    Report Status PENDING  Incomplete   Organism ID, Bacteria STAPHYLOCOCCUS EPIDERMIDIS  Final      Susceptibility   Staphylococcus epidermidis - MIC*    CIPROFLOXACIN <=0.5 SENSITIVE Sensitive     ERYTHROMYCIN >=8 RESISTANT Resistant     GENTAMICIN <=0.5 SENSITIVE Sensitive     OXACILLIN >=4 RESISTANT Resistant     TETRACYCLINE <=1 SENSITIVE Sensitive     VANCOMYCIN 1 SENSITIVE Sensitive     TRIMETH/SULFA 160 RESISTANT Resistant     CLINDAMYCIN <=0.25 SENSITIVE Sensitive     RIFAMPIN <=0.5 SENSITIVE Sensitive     Inducible Clindamycin NEGATIVE Sensitive     * RARE STAPHYLOCOCCUS EPIDERMIDIS         Radiology Studies: No results found.      Scheduled Meds: . dicyclomine  20 mg Oral TID AC & HS  . docusate sodium  100 mg Oral BID  . DULoxetine  20 mg Oral Daily  . ferrous sulfate  325 mg Oral Q breakfast  . fluconazole  400 mg Oral Daily  . folic acid  1 mg Oral Daily  . furosemide  20 mg Oral  Daily  . lactulose  30 g Oral TID  . magnesium oxide  400 mg Oral Daily  . metoprolol succinate  12.5 mg Oral Daily  . multivitamin with minerals  1 tablet Oral Daily  . OLANZapine  10 mg Oral QHS  . oxyCODONE  10 mg Oral Q12H  . pantoprazole  40 mg Oral Daily  . potassium chloride  20 mEq Oral Daily  . senna  1 tablet Oral BID  . spironolactone  25 mg Oral Daily  . sucralfate  1 g Oral TID WC & HS  . thiamine  100 mg Oral Daily   Or  . thiamine  100 mg Intravenous Daily  . traMADol  50 mg Oral Q6H  . vitamin B-12  500 mcg Oral Daily   Continuous Infusions: . sodium chloride 75 mL/hr at 02/06/20 2122  . ceFEPime (MAXIPIME) IV 2 g (02/07/20 2237)  . fluconazole (DIFLUCAN) IV     Followed by  . fluconazole (DIFLUCAN) IV Stopped (02/07/20 1749)   . vancomycin 750 mg (02/08/20 0411)     LOS: 5 days    Time spent: 40 minutes    Irine Seal, MD [facility]   To contact the attending provider between 7A-7P or the covering provider during after hours 7P-7A, please log into the web site [email or web address] and access using universal [place] password for that web site. If you do not have the password, please call the hospital operator.  02/08/2020, 1:23 PM

## 2020-02-08 NOTE — Progress Notes (Addendum)
Pharmacy Antibiotic Note  Rhonda Martin is a 44 y.o. female admitted on 02/03/2020 with sepsis. Patient recently underwent TKA 3 weeks ago. Wound VAC was placed 8/24 but removed by patient on 8/26. S/p TKR revision and placement of vanc beads in wound on 8/29. ID is following. Pharmacy has been consulted for Vanc dosing. Patient is also receiving Cefepime 2g IV q8h.    Day 6 IV abx, Vancomycin AM dose was missed on 8/31. WBC wnl, fever spike 9/2 AM, and renal function remains stable. Fluconazole added for yeast.   Plan: Continue Vancomycin 750 mg IV Q12H per dosing nomogram. Continue to monitor renal function and adjust dose if needed -Given change in administration times, will order vanc trough @1530  9/2  Height: 5\' 2"  (157.5 cm) Weight: 63.5 kg (140 lb) IBW/kg (Calculated) : 50.1  Temp (24hrs), Avg:99.5 F (37.5 C), Min:98.7 F (37.1 C), Max:100.7 F (38.2 C)  Recent Labs  Lab 02/03/20 0944 02/03/20 0951 02/03/20 0951 02/04/20 1646 02/05/20 0450 02/06/20 0227 02/07/20 0502 02/08/20 0337  WBC  --  5.3   < > 6.3 5.2 5.8 5.1 5.7  CREATININE  --  0.47  --   --  0.46 0.41* 0.42* 0.39*  LATICACIDVEN 1.8  --   --   --   --   --   --   --    < > = values in this interval not displayed.    Estimated Creatinine Clearance: 78.6 mL/min (A) (by C-G formula based on SCr of 0.39 mg/dL (L)).    Allergies  Allergen Reactions  . Tylenol [Acetaminophen] Other (See Comments)    Liver disease    Antimicrobials this admission: 8/28 Vanc >>  8/28 Zosyn >> 8/30 8/30 Cefepime >> 9/1 Fluconazole >>  Dose adjustments this admission:   Microbiology results: 8/28 BCx: NGTD  8/29 Wcx: pending  Thank you for allowing pharmacy to be a part of this patient's care.  Sherilyn Banker, PharmD Pharmacy Resident  02/08/2020 12:41 PM

## 2020-02-08 NOTE — TOC Progression Note (Signed)
Transition of Care [facility]) - Progression Note    Patient Details  Name: Rhonda Martin MRN: [id number] Date of Birth: 1976/04/23  Transition of [facility]) CM/SW [place], RN Phone Number: 02/08/2020, 3:50 PM  Clinical Narrative:   I called Mardene Celeste with [employer] to let her know the patient needs crutches, provided the name DOB and room number.     Expected Discharge Plan: Home/Self Care Barriers to Discharge: No [facility] will accept this patient  Expected Discharge Plan and Services Expected Discharge Plan: Home/Self Care                                               Social Determinants of Health (SDOH) Interventions    Readmission Risk Interventions Readmission Risk Prevention Plan 11/29/2019 10/25/2019 01/21/2019  Post Dischage Appt - - Complete  Medication Screening - - Complete  Transportation Screening Complete Complete Complete  PCP or Specialist Appt within 3-5 Days - Complete -  HRI or Home Care Consult - Complete -  Medication Review ([place]) Complete Complete -  PCP or Specialist appointment within 3-5 days of discharge Complete - -  Palliative Care Screening Not Applicable - -  [place] Not Applicable - -

## 2020-02-08 NOTE — Progress Notes (Signed)
[facility] INFECTIOUS DISEASE PROGRESS NOTE Date of Admission:  02/03/2020     ID: Rhonda Martin is a 44 y.o. female with PJI Principal Problem:   Sinus tachycardia Active Problems:   Chronic hepatitis C without hepatic coma (HCC)   Alcohol abuse   Tobacco abuse   Alcohol withdrawal delirium (HCC)   Knee pain, right   Sepsis (HCC)   COPD (chronic obstructive pulmonary disease) (HCC)   HTN (hypertension)   Hepatic encephalopathy (HCC)   S/P TKR (total knee replacement) using cement, right   Wound dehiscence, surgical, initial encounter   Anemia   Subjective: Still with pain and asks for more pain meds. Much more alert today  ROS  Eleven systems are reviewed and negative except per hpi  Medications:  Antibiotics Given (last 72 hours)    Date/Time Action Medication Dose Rate   02/05/20 1939 New Bag/Given   ceFEPIme (MAXIPIME) 2 g in sodium chloride 0.9 % 100 mL IVPB 2 g 200 mL/hr   02/05/20 2149 New Bag/Given   vancomycin (VANCOREADY) IVPB 750 mg/150 mL 750 mg 150 mL/hr   02/06/20 0657 New Bag/Given   ceFEPIme (MAXIPIME) 2 g in sodium chloride 0.9 % 100 mL IVPB 2 g 200 mL/hr   02/06/20 1600 New Bag/Given   ceFEPIme (MAXIPIME) 2 g in sodium chloride 0.9 % 100 mL IVPB 2 g 200 mL/hr   02/06/20 1630 New Bag/Given   vancomycin (VANCOREADY) IVPB 750 mg/150 mL 750 mg 150 mL/hr   02/06/20 2123 New Bag/Given   ceFEPIme (MAXIPIME) 2 g in sodium chloride 0.9 % 100 mL IVPB 2 g 200 mL/hr   02/07/20 0309 New Bag/Given   vancomycin (VANCOREADY) IVPB 750 mg/150 mL 750 mg 150 mL/hr   02/07/20 0602 New Bag/Given   ceFEPIme (MAXIPIME) 2 g in sodium chloride 0.9 % 100 mL IVPB 2 g 200 mL/hr   02/07/20 1607 New Bag/Given   ceFEPIme (MAXIPIME) 2 g in sodium chloride 0.9 % 100 mL IVPB 2 g 200 mL/hr   02/07/20 1758 New Bag/Given   vancomycin (VANCOREADY) IVPB 750 mg/150 mL 750 mg 150 mL/hr   02/07/20 2237 New Bag/Given   ceFEPIme (MAXIPIME) 2 g in sodium chloride 0.9 % 100 mL IVPB 2 g 200  mL/hr   02/08/20 0411 New Bag/Given   vancomycin (VANCOREADY) IVPB 750 mg/150 mL 750 mg 150 mL/hr   02/08/20 1335 New Bag/Given   ceFEPIme (MAXIPIME) 2 g in sodium chloride 0.9 % 100 mL IVPB 2 g 200 mL/hr     . dicyclomine  20 mg Oral TID AC & HS  . docusate sodium  100 mg Oral BID  . DULoxetine  20 mg Oral Daily  . ferrous sulfate  325 mg Oral Q breakfast  . fluconazole  400 mg Oral Daily  . folic acid  1 mg Oral Daily  . furosemide  20 mg Oral Daily  . lactulose  30 g Oral TID  . magnesium oxide  400 mg Oral Daily  . metoprolol succinate  12.5 mg Oral Daily  . multivitamin with minerals  1 tablet Oral Daily  . OLANZapine  10 mg Oral QHS  . oxyCODONE  10 mg Oral Q12H  . pantoprazole  40 mg Oral Daily  . potassium chloride  20 mEq Oral Daily  . senna  1 tablet Oral BID  . spironolactone  25 mg Oral Daily  . sucralfate  1 g Oral TID WC & HS  . thiamine  100 mg Oral Daily  Or  . thiamine  100 mg Intravenous Daily  . traMADol  50 mg Oral Q6H  . vitamin B-12  500 mcg Oral Daily    Objective: Vital signs in last 24 hours: Temp:  [98.7 F (37.1 C)-100.7 F (38.2 C)] 98.7 F (37.1 C) (09/02 0834) Pulse Rate:  [88-110] 88 (09/02 0834) Resp:  [16] 16 (09/02 0834) BP: (116-127)/(60-66) 116/62 (09/02 0834) SpO2:  [95 %-100 %] 100 % (09/02 0834) Constitutional: She is lethargic HENT: anicteric Mouth/Throat: Oropharynx is clear and moist. No oropharyngeal exudate.  Cardiovascular: Normal rate, regular rhythm and normal heart sounds.Pulmonary/Chest: Effort normal and breath sounds normal. No respiratory distress. He has no wheezes.  Abdominal: Soft. Bowel sounds are normal. He exhibits no distension. There is no tenderness.  Lymphadenopathy: He has no cervical adenopathy.  Neurological: lethargic Skin: Skin is warm and dry. No rash noted. No erythema.  Ext R knee in immobilize Psychiatric: He has a normal mood and affect. His behavior is normal.   Lab Results Recent Labs     02/07/20 0502 02/08/20 0337  WBC 5.1 5.7  HGB 7.1* 7.5*  HCT 22.0* 22.8*  NA 138 138  K 3.5 3.3*  CL 110 106  CO2 23 26  BUN 7 8  CREATININE 0.42* 0.39*    Microbiology: Results for orders placed or performed during the hospital encounter of 02/03/20  Blood culture (routine x 2)     Status: None   Collection Time: 02/03/20  9:48 AM   Specimen: BLOOD  Result Value Ref Range Status   Specimen Description BLOOD RFA  Final   Special Requests   Final    BOTTLES DRAWN AEROBIC AND ANAEROBIC Blood Culture adequate volume   Culture   Final    NO GROWTH 5 DAYS Performed at [facility], [address] [place], [place] [address]    Report Status 02/08/2020 FINAL  Final  Blood culture (routine x 2)     Status: None   Collection Time: 02/03/20  9:50 AM   Specimen: BLOOD  Result Value Ref Range Status   Specimen Description BLOOD RT UPPER ARM  Final   Special Requests   Final    BOTTLES DRAWN AEROBIC AND ANAEROBIC Blood Culture results may not be optimal due to an inadequate volume of blood received in culture bottles   Culture   Final    NO GROWTH 5 DAYS Performed at [facility], [address], [place], [place] [address]    Report Status 02/08/2020 FINAL  Final  SARS Coronavirus 2 by RT PCR (hospital order, performed in [facility] hospital lab) Nasopharyngeal Nasopharyngeal Swab     Status: None   Collection Time: 02/03/20  9:51 AM   Specimen: Nasopharyngeal Swab  Result Value Ref Range Status   SARS Coronavirus 2 NEGATIVE NEGATIVE Final    Comment: (NOTE) SARS-CoV-2 target nucleic acids are NOT DETECTED.  The SARS-CoV-2 RNA is generally detectable in upper and lower respiratory specimens during the acute phase of infection. The lowest concentration of SARS-CoV-2 viral copies this assay can detect is 250 copies / mL. A negative result does not preclude SARS-CoV-2 infection and should not be used as the sole basis for treatment or other patient  management decisions.  A negative result may occur with improper specimen collection / handling, submission of specimen other than nasopharyngeal swab, presence of viral mutation(s) within the areas targeted by this assay, and inadequate number of viral copies (<250 copies / mL). A negative result  must be combined with clinical observations, patient history, and epidemiological information.  Fact Sheet for Patients:   StrictlyIdeas.no  Fact Sheet for Healthcare Providers: BankingDealers.co.za  This test is not yet approved or  cleared by the [place] FDA and has been authorized for detection and/or diagnosis of SARS-CoV-2 by FDA under an Emergency Use Authorization (EUA).  This EUA will remain in effect (meaning this test can be used) for the duration of the COVID-19 declaration under Section 564(b)(1) of the Act, 21 U.S.C. section 360bbb-3(b)(1), unless the authorization is terminated or revoked sooner.  Performed at [facility], [address] [place], [place] [address]   Aerobic/Anaerobic Culture (surgical/deep wound)     Status: None (Preliminary result)   Collection Time: 02/04/20  9:23 AM   Specimen: PATH Other; Tissue  Result Value Ref Range Status   Specimen Description   Final    KNEE IRRIGATION AND DEBRIDEMENT RIGHT KNEE Performed at [facility], [address] [place], [place] [address]    Special Requests   Final    NONE Performed at [facility], [address] [place], [place] [address]    Gram Stain   Final    NO WBC SEEN NO ORGANISMS SEEN Performed at [facility], [place] [address], [place], [place] [address]    Culture   Final    RARE STAPHYLOCOCCUS EPIDERMIDIS RARE CANDIDA PARAPSILOSIS NO ANAEROBES ISOLATED; CULTURE IN PROGRESS FOR 5 DAYS    Report Status PENDING  Incomplete   Organism ID, Bacteria STAPHYLOCOCCUS EPIDERMIDIS  Final      Susceptibility    Staphylococcus epidermidis - MIC*    CIPROFLOXACIN <=0.5 SENSITIVE Sensitive     ERYTHROMYCIN >=8 RESISTANT Resistant     GENTAMICIN <=0.5 SENSITIVE Sensitive     OXACILLIN >=4 RESISTANT Resistant     TETRACYCLINE 2 SENSITIVE Sensitive     VANCOMYCIN 1 SENSITIVE Sensitive     TRIMETH/SULFA 160 RESISTANT Resistant     CLINDAMYCIN <=0.25 SENSITIVE Sensitive     RIFAMPIN <=0.5 SENSITIVE Sensitive     Inducible Clindamycin NEGATIVE Sensitive     * RARE STAPHYLOCOCCUS EPIDERMIDIS  Aerobic/Anaerobic Culture (surgical/deep wound)     Status: None (Preliminary result)   Collection Time: 02/04/20  9:23 AM   Specimen: PATH Other; Tissue  Result Value Ref Range Status   Specimen Description   Final    KNEE Performed at [facility], [address] [place], [place] [address]    Special Requests   Final    KNEE RIGHT KNEE Performed at [facility], [address] [place], [place] [address]    Gram Stain   Final    FEW WBC PRESENT, PREDOMINANTLY MONONUCLEAR NO ORGANISMS SEEN Performed at [facility], [place] [address] [place], [place] [address]    Culture   Final    RARE STAPHYLOCOCCUS EPIDERMIDIS RARE YEAST IDENTIFICATION TO FOLLOW NO ANAEROBES ISOLATED; CULTURE IN PROGRESS FOR 5 DAYS    Report Status PENDING  Incomplete   Organism ID, Bacteria STAPHYLOCOCCUS EPIDERMIDIS  Final      Susceptibility   Staphylococcus epidermidis - MIC*    CIPROFLOXACIN <=0.5 SENSITIVE Sensitive     ERYTHROMYCIN >=8 RESISTANT Resistant     GENTAMICIN <=0.5 SENSITIVE Sensitive     OXACILLIN >=4 RESISTANT Resistant     TETRACYCLINE <=1 SENSITIVE Sensitive     VANCOMYCIN 1 SENSITIVE Sensitive     TRIMETH/SULFA 160 RESISTANT Resistant     CLINDAMYCIN <=0.25 SENSITIVE  Sensitive     RIFAMPIN <=0.5 SENSITIVE Sensitive     Inducible Clindamycin NEGATIVE Sensitive     * RARE STAPHYLOCOCCUS EPIDERMIDIS    Studies/Results: No results found.  Assessment/Plan: Rhonda Martin is a  43 y.o. female who under R TKA by Dr. Rudene Martin on 01/11/20. Patient was discharged home on 01/16/20 due to patient preference despite PT recommendations for SNF. She was seen for 2 week postop visit on 01/24/20 by Rhonda Hora, PA, at which time her incision was intact and there was mild drainage. Half of the staples were removed and patient was started on PO Abx. She presented to ED on 01/28/20 due to bleeding from the incision with opening at the superior aspect. She did not stay for full MD evaluation. She was then re-evaluated by Rhonda Martin on 01/30/20. There was noted dehiscence of the midportion of the incision at that time. Patient had increased pain as well. Wound VAC was set up for patient with nursing for [facility] application to be performed that day. The patient apparently removed the wound VAC and presented to ED on 8/28 On 8/29 s/p TKR revision, removal of hardware and placement of abx beads. Cxs sent 9/2 no fevers, cultures reviewed with staph epi and candida parapsilosis.  Recommendations Continue vancomycin and oral fluconazole while inpatient  Can dc cefepime No plans for reimplantation. AT dc would send on oral doxy 100 mg bid for 6 weeks and oral fluconazole 400 mg daily for 6 weeks Will need lfts monitored every 2 weeks initially while on fluconazole.  Will need fu with ortho and I can see in ID at [facility] clinic- please schedule when ready for DC.  I WILL SIGN OFF BUT PLEASE CALL WITH QUESTIONS  Rhonda Martin   02/08/2020, 3:12 PM

## 2020-02-08 NOTE — Progress Notes (Signed)
Physical Therapy Treatment Patient Details Name: Rhonda Martin MRN: [id number] DOB: [birth year] Today's Date: 02/08/2020    History of Present Illness Pt is a 44 y.o. female presenting to hospital 8/28 with confusion and R knee pain.  Pt with recent admit 8/5-8/10 for R TKA (Dr. Rudene Christians).  New imaging of R knee showing apparent fx fragment adjacent to lateral aspect of proximal tibia; subtle fx line extending through the tibial metaphysis.  Pt now admitted with sepsis secondary surgical site wound dehiscence; also noted with patellar tendon rupture.  Pt s/p R total knee revision and I&D 8/29.  PMH includes alcohol abuse, anxiety, h/o back injury, Charcot-Marie-Tooth disease, COPD, Hepatitis C, htn, thrombocytopenia.    PT Comments    Pt was long sitting in bed. At first, resistive to PT session but with encouragement is agreeable. She is alert and oriented but has impulsivity and poor safety awareness. She continues to c/o sever R LE pain with all movements. Unwilling to allow therapist to touch/progress LE. She was able to sit up EOB, L side of bed, with supervision+ increased time. Knee immobilizer donned throughout. Stood to [employer] with min assist for safety 2/2 to pt's unwillingness to follow therapist commands/technique safely. Pt performed NWB throughout session and unwilling to place RLE on floor 2/2 to pain. Pt is PWB but acts NWB throughout. Ambulated 2 x 12 ft with RW without difficulty. Unwilling to go further distances 2/2 to pain. Will continue efforts to improve safety functional mobility. Pt is unwilling to sign check over for rehab even when recommended. She will benefit from continued skilled PT to address strength, balance, and safe functional mobility. Pt is in recliner with chair alarm in place, call bell in reach, and RN aware of pt's abilities and concerns.    Follow Up Recommendations  SNF;Other (comment);Supervision/Assistance - 24 hour;Supervision for mobility/OOB;Follow surgeon's  recommendation for DC plan and follow-up therapies (pt unwilling to go to SNF)     Equipment Recommendations  Rolling walker with 5" wheels;3in1 (PT) (per pt, has w/c already at home)    Recommendations for Other Services       Precautions / Restrictions Precautions Precautions: Fall;Knee Precaution Booklet Issued: No Precaution Comments: Wound vac Required Braces or Orthoses: Knee Immobilizer - Right Knee Immobilizer - Right: On at all times Restrictions Weight Bearing Restrictions: Yes RLE Weight Bearing: Partial weight bearing RLE Partial Weight Bearing Percentage or Pounds: 50%    Mobility  Bed Mobility Overal bed mobility: Needs Assistance Bed Mobility: Supine to Sit;Sit to Supine     Supine to sit: Supervision     General bed mobility comments: Sup>sitr with supervision only. increased tim to perform 2/2 to pain  Transfers Overall transfer level: Needs assistance Equipment used: None Transfers: Sit to/from Stand Sit to Stand: Min assist         General transfer comment: Min assist for safety becaus of impulsivity and poor compliance of wt bearing. pt performs all task as NWB even though pt is PWB 50 %. unwilling to let RLE touch floor   Ambulation/Gait Ambulation/Gait assistance: Min guard Gait Distance (Feet): 12 Feet Assistive device: Rolling walker (2 wheeled) Gait Pattern/deviations:  (" hop to ") Gait velocity: decreased   General Gait Details: pt was able to ambulate 12 ft with RW while performing hop to pattern. pt unwilling to let RLE touch floor during gait training. performed 2 x 12 ft easily with [place] of RW but unwilling to go further distances 2/2 to pain. pt  could perform increased distance without assistnace but is self limiting. vital stable throughout.   Stairs             Wheelchair Mobility    Modified Rankin (Stroke Patients Only)       Balance Overall balance assessment: Needs assistance Sitting-balance support: Feet  supported Sitting balance-Leahy Scale: Good Sitting balance - Comments: no LOB in sitting    Standing balance support: Bilateral upper extremity supported;During functional activity Standing balance-Leahy Scale: Good Standing balance comment: no LOB in standing with UE support however due to pt's impulsivity is at high fall risk                            Cognition Arousal/Alertness: Awake/alert Behavior During Therapy: Impulsive;Anxious Overall Cognitive Status: History of cognitive impairments - at baseline                                 General Comments: Pt is alert and oriented however impulsive and anxious      Exercises Total Joint Exercises Ankle Circles/Pumps: AROM;10 reps Quad Sets: Strengthening;10 reps Gluteal Sets: AROM;10 reps Hip ABduction/ADduction: AAROM;10 reps Straight Leg Raises: AAROM;5 reps    General Comments        Pertinent Vitals/Pain Pain Assessment: 0-10 Pain Score: 8  Faces Pain Scale: Hurts even more Pain Location: 8/10 RLE Pain Descriptors / Indicators: Constant;Discomfort;Penetrating;Sharp Pain Intervention(s): Limited activity within patient's tolerance;Monitored during session;Premedicated before session;Repositioned    Home Living                      Prior Function            PT Goals (current goals can now be found in the care plan section) Acute Rehab PT Goals Patient Stated Goal: get pain under control Progress towards PT goals: Progressing toward goals    Frequency    7X/week      PT Plan Current plan remains appropriate    Co-evaluation              AM-PAC PT "6 Clicks" Mobility   Outcome Measure  Help needed turning from your back to your side while in a flat bed without using bedrails?: A Little Help needed moving from lying on your back to sitting on the side of a flat bed without using bedrails?: A Little Help needed moving to and from a bed to a chair (including a  wheelchair)?: A Little Help needed standing up from a chair using your arms (e.g., wheelchair or bedside chair)?: A Little Help needed to walk in hospital room?: A Little Help needed climbing 3-5 steps with a railing? : A Lot 6 Click Score: 17    End of Session Equipment Utilized During Treatment: Gait belt Activity Tolerance: Patient limited by pain Patient left: with chair alarm set;with call bell/phone within reach;in chair Nurse Communication: Mobility status;Precautions PT Visit Diagnosis: Unsteadiness on feet (R26.81);Other abnormalities of gait and mobility (R26.89);Muscle weakness (generalized) (M62.81);Difficulty in walking, not elsewhere classified (R26.2);Pain Pain - Right/Left: Right Pain - part of body: Knee     Time: 1010-1027 PT Time Calculation (min) (ACUTE ONLY): 17 min  Charges:  $Gait Training: 8-22 mins                     Julaine Fusi PTA 02/08/20, 11:13 AM

## 2020-02-08 NOTE — Progress Notes (Signed)
Subjective: 4 Days Post-Op Procedure(s) (LRB): TOTAL KNEE REVISION (Right) IRRIGATION AND DEBRIDEMENT KNEE (Right) Patient reports pain as moderate.  Patient with no other complaints.  Denies any cough,CP, SOB, ABD pain. We will continue with therapy today.    Objective: Vital signs in last 24 hours: Temp:  [99.2 F (37.3 C)-100.7 F (38.2 C)] 99.2 F (37.3 C) (09/02 0156) Pulse Rate:  [99-110] 99 (09/02 0001) Resp:  [16] 16 (09/02 0001) BP: (126-127)/(60-66) 127/66 (09/02 0001) SpO2:  [95 %-97 %] 97 % (09/02 0001)  Intake/Output from previous day: 09/01 0701 - 09/02 0700 In: 720 [P.O.:720] Out: 3100 [Urine:3100] Intake/Output this shift: No intake/output data recorded.  Recent Labs    02/06/20 0227 02/07/20 0502 02/08/20 0337  HGB 6.5* 7.1* 7.5*   Recent Labs    02/07/20 0502 02/08/20 0337  WBC 5.1 5.7  RBC 2.58* 2.77*  HCT 22.0* 22.8*  PLT 107* 121*   Recent Labs    02/07/20 0502 02/08/20 0337  NA 138 138  K 3.5 3.3*  CL 110 106  CO2 23 26  BUN 7 8  CREATININE 0.42* 0.39*  GLUCOSE 116* 109*  CALCIUM 7.3* 7.4*   No results for input(s): LABPT, INR in the last 72 hours.  EXAM General - Patient is Alert, Appropriate and Oriented Extremity - Neurovascular intact Sensation intact distally Intact pulses distally Dorsiflexion/Plantar flexion intact No cellulitis present Compartment soft Dressing - dressing C/D/I , prevena intact with 225 cc drainage. Motor Function - intact, moving foot and toes well on exam.   Past Medical History:  Diagnosis Date  . Alcohol abuse   . Anxiety   . Back injury   . Cervical cancer ([place])   . Charcot-Marie-Tooth disease   . COPD (chronic obstructive pulmonary disease) ([place])   . Family history of adverse reaction to anesthesia    PONV  . GERD (gastroesophageal reflux disease)   . Hepatitis    liver fibrosis, Hep C negative on 09/3019  . Hypertension   . Hypokalemia   . IDA (iron deficiency anemia)  06/26/2019  . Iron deficiency anemia   . Leg injury   . Liver cirrhosis ([place])   . Pneumonia   . Sepsis ([place]) 07/10/2019  . Symptomatic anemia 06/26/2019  . Thrombocytopenia (HCC)     Assessment/Plan:   4 Days Post-Op Procedure(s) (LRB): TOTAL KNEE REVISION (Right) IRRIGATION AND DEBRIDEMENT KNEE (Right) Principal Problem:   Sinus tachycardia Active Problems:   Chronic hepatitis C without hepatic coma (HCC)   Alcohol abuse   Tobacco abuse   Alcohol withdrawal delirium (HCC)   Knee pain, right   Sepsis (HCC)   COPD (chronic obstructive pulmonary disease) (HCC)   HTN (hypertension)   Hepatic encephalopathy (HCC)   S/P TKR (total knee replacement) using cement, right   Wound dehiscence, surgical, initial encounter   Anemia   Acute post op blood loss anemia with underlying chronic anemia   Estimated body mass index is 25.61 kg/m as calculated from the following:   Height as of this encounter: 5\' 2"  (1.575 m).   Weight as of this encounter: 63.5 kg. Advance diet Up with therapy, PWB (50%) RLE with knee immobilizer on at all times Acute post op blood loss anemia with underlying chronic anemia - Hgb7.5, trending up. S/p 1 unit of PRBC 02/06/20. Continue with IV abx per ID Sensitivities pending Pain controlled  DVT Prophylaxis - TED hose and SCDs    T. Rachelle Hora, PA-C [place] 02/08/2020, 8:28  AM

## 2020-02-08 NOTE — Progress Notes (Signed)
02/08/20 0001 02/08/20 0041 02/08/20 0156  Assess: MEWS Score  Temp (!) 100.7 F (38.2 C)  --  99.2 F (37.3 C)  BP 127/66  --   --   Level of Consciousness Alert  --   --   SpO2 97 %  --   --   Assess: MEWS Score  MEWS Temp 1  --  0  MEWS Systolic 0  --  0  MEWS Pulse 1  --  1  MEWS RR 0  --  0  MEWS LOC 0  --  0  MEWS Score 2  --  1  MEWS Score Color Yellow  --  Green  Assess: if the MEWS score is Yellow or Red  Were vital signs taken at a resting state? Yes  --   --   Focused Assessment No change from prior assessment  --   --   Early Detection of Sepsis Score *See Row Information* Low  --   --   MEWS guidelines implemented *See Row Information* No, vital signs rechecked  --   --   Treat  MEWS Interventions Administered prn meds/treatments  --   --   Pain Scale 0-10 0-10  --   Pain Score 0 7  --   Pain Type  --  Acute pain  --   Pain Location  --  Generalized  --   Pain Intervention(s)  --  Medication (See eMAR)  --   Escalate  MEWS: Escalate Yellow: discuss with charge nurse/RN and consider discussing with provider and RRT  --   --   Notify: Charge Nurse/RN  Name of Charge Nurse/RN Notified Microbiologist  --   --   Date Charge Nurse/RN Notified 02/08/20  --   --   Time Charge Nurse/RN Notified 0003  --   --   Notify: Provider  Provider Name/Title Ouma  --   --   Date Provider Notified 02/08/20  --   --   Time Provider Notified 0000  --   --   Notification Type Page  --   --   Notification Reason Change in status  --   --   Response See new orders  --   --   Date of Provider Response 02/08/20  --   --   Time of Provider Response 0015  --   --   Document  Patient Outcome Stabilized after interventions  --   --   Progress note created (see row info) Yes  --   --      02/08/20 0001 02/08/20 0041 02/08/20 0156  Assess: MEWS Score  Temp (!) 100.7 F (38.2 C)  --  99.2 F (37.3 C)  BP 127/66  --   --   Level of Consciousness Alert  --   --   SpO2 97 %  --   --    Assess: MEWS Score  MEWS Temp 1  --  0  MEWS Systolic 0  --  0  MEWS Pulse 1  --  1  MEWS RR 0  --  0  MEWS LOC 0  --  0  MEWS Score 2  --  1  MEWS Score Color Yellow  --  Green  Assess: if the MEWS score is Yellow or Red  Were vital signs taken at a resting state? Yes  --   --   Focused Assessment No change from prior assessment  --   --   Early Detection  of Sepsis Score *See Row Information* Low  --   --   MEWS guidelines implemented *See Row Information* No, vital signs rechecked  --   --   Treat  MEWS Interventions Administered prn meds/treatments  --   --   Pain Scale 0-10 0-10  --   Pain Score 0 7  --   Pain Type  --  Acute pain  --   Pain Location  --  Generalized  --   Pain Intervention(s)  --  Medication (See eMAR)  --   Escalate  MEWS: Escalate Yellow: discuss with charge nurse/RN and consider discussing with provider and RRT  --   --   Notify: Charge Nurse/RN  Name of Charge Nurse/RN Notified Microbiologist  --   --   Date Charge Nurse/RN Notified 02/08/20  --   --   Time Charge Nurse/RN Notified 0003  --   --   Notify: Provider  Provider Name/Title Ouma  --   --   Date Provider Notified 02/08/20  --   --   Time Provider Notified 0000  --   --   Notification Type Page  --   --   Notification Reason Change in status  --   --   Response See new orders  --   --   Date of Provider Response 02/08/20  --   --   Time of Provider Response 0015  --   --   Document  Patient Outcome Stabilized after interventions  --   --   Progress note created (see row info) Yes  --   --

## 2020-02-08 NOTE — Progress Notes (Deleted)
Pharmacy Antibiotic Note  Rhonda Martin is a 44 y.o. female admitted on 02/03/2020 with sepsis. Patient recently underwent TKA 3 weeks ago. Wound VAC was placed 8/24 but removed by patient on 8/26. S/p TKR revision and placement of vanc beads in wound on 8/29. ID is following. Pharmacy has been consulted for Vanc dosing. Patient is also receiving Cefepime 2g IV q8h.    Day 6 IV abx, Vancomycin AM dose was missed on 8/31. WBC wnl, fever spike 9/2 AM, and renal function remains stable. Fluconazole added for yeast.   Plan: Continue Vancomycin 750 mg IV Q12H per dosing nomogram. Continue to monitor renal function and adjust dose if needed -Given change in administration times, will plan to order vanc trough @1530  9/2  Height: 5\' 2"  (157.5 cm) Weight: 63.5 kg (140 lb) IBW/kg (Calculated) : 50.1  Temp (24hrs), Avg:99.5 F (37.5 C), Min:98.7 F (37.1 C), Max:100.7 F (38.2 C)  Recent Labs  Lab 02/03/20 0944 02/03/20 0951 02/03/20 0951 02/04/20 1646 02/05/20 0450 02/06/20 0227 02/07/20 0502 02/08/20 0337  WBC  --  5.3   < > 6.3 5.2 5.8 5.1 5.7  CREATININE  --  0.47  --   --  0.46 0.41* 0.42* 0.39*  LATICACIDVEN 1.8  --   --   --   --   --   --   --    < > = values in this interval not displayed.    Estimated Creatinine Clearance: 78.6 mL/min (A) (by C-G formula based on SCr of 0.39 mg/dL (L)).    Allergies  Allergen Reactions  . Tylenol [Acetaminophen] Other (See Comments)    Liver disease    Antimicrobials this admission: 8/28 Vanc >>  8/28 Zosyn >> 8/30 8/30 Cefepime >> 9/1 Fluconazole >>  Dose adjustments this admission:   Microbiology results: 8/28 BCx: NGTD  8/29 Wcx: pending  Thank you for allowing pharmacy to be a part of this patient's care.  Sherilyn Banker, PharmD Pharmacy Resident  02/08/2020 10:45 AM

## 2020-02-09 DIAGNOSIS — Z96651 Presence of right artificial knee joint: Secondary | ICD-10-CM

## 2020-02-09 DIAGNOSIS — F10231 Alcohol dependence with withdrawal delirium: Secondary | ICD-10-CM

## 2020-02-09 LAB — CBC
HCT: 24 % — ABNORMAL LOW (ref 36.0–46.0)
Hemoglobin: 7.8 g/dL — ABNORMAL LOW (ref 12.0–15.0)
MCH: 27 pg (ref 26.0–34.0)
MCHC: 32.5 g/dL (ref 30.0–36.0)
MCV: 83 fL (ref 80.0–100.0)
Platelets: 122 10*3/uL — ABNORMAL LOW (ref 150–400)
RBC: 2.89 MIL/uL — ABNORMAL LOW (ref 3.87–5.11)
RDW: 18.4 % — ABNORMAL HIGH (ref 11.5–15.5)
WBC: 6.1 10*3/uL (ref 4.0–10.5)
nRBC: 0 % (ref 0.0–0.2)

## 2020-02-09 LAB — BASIC METABOLIC PANEL
Anion gap: 4 — ABNORMAL LOW (ref 5–15)
BUN: 10 mg/dL (ref 6–20)
CO2: 27 mmol/L (ref 22–32)
Calcium: 8.3 mg/dL — ABNORMAL LOW (ref 8.9–10.3)
Chloride: 107 mmol/L (ref 98–111)
Creatinine, Ser: 0.33 mg/dL — ABNORMAL LOW (ref 0.44–1.00)
GFR calc Af Amer: >60 mL/min (ref >60)
GFR calc non Af Amer: >60 mL/min (ref >60)
Glucose, Bld: 126 mg/dL — ABNORMAL HIGH (ref 70–99)
Potassium: 4.2 mmol/L (ref 3.5–5.1)
Sodium: 138 mmol/L (ref 135–145)

## 2020-02-09 LAB — MAGNESIUM: Magnesium: 1.8 mg/dL (ref 1.7–2.4)

## 2020-02-09 LAB — AEROBIC/ANAEROBIC CULTURE W GRAM STAIN (SURGICAL/DEEP WOUND): Gram Stain: NONE SEEN

## 2020-02-09 LAB — VANCOMYCIN, TROUGH: Vancomycin Tr: <4 ug/mL — ABNORMAL LOW (ref 15–20)

## 2020-02-09 MED ORDER — MAGNESIUM SULFATE 2 GM/50ML IV SOLN
2.0000 g | Freq: Once | INTRAVENOUS | Status: AC
  Administered 2020-02-09: 2 g via INTRAVENOUS
  Filled 2020-02-09: qty 50

## 2020-02-09 MED ORDER — LACTULOSE 10 GM/15ML PO SOLN: 30.0000 g | Freq: Three times a day (TID) | ORAL | 1 refills | Status: DC

## 2020-02-09 MED ORDER — ASPIRIN 81 MG PO CHEW
81.0000 mg | CHEWABLE_TABLET | Freq: Two times a day (BID) | ORAL | 0 refills | Status: DC
Start: 2020-02-16 — End: 2020-07-04

## 2020-02-09 MED ORDER — FLUCONAZOLE 200 MG PO TABS: 400.0000 mg | ORAL_TABLET | Freq: Every day | ORAL | 0 refills | Status: DC

## 2020-02-09 MED ORDER — SENNA 8.6 MG PO TABS: 1.0000 | ORAL_TABLET | Freq: Two times a day (BID) | ORAL | 0 refills | Status: DC

## 2020-02-09 MED ORDER — OXYCODONE HCL 5 MG PO TABS
5.0000 mg | ORAL_TABLET | Freq: Four times a day (QID) | ORAL | 0 refills | Status: DC | PRN
Start: 2020-02-09 — End: 2021-03-25

## 2020-02-09 MED ORDER — OXYCODONE HCL ER 10 MG PO T12A: 10.0000 mg | EXTENDED_RELEASE_TABLET | Freq: Two times a day (BID) | ORAL | 0 refills | Status: DC

## 2020-02-09 MED ORDER — IBUPROFEN 200 MG PO TABS: 200.0000 mg | ORAL_TABLET | Freq: Four times a day (QID) | ORAL | 0 refills | Status: DC | PRN

## 2020-02-09 MED ORDER — DOXYCYCLINE MONOHYDRATE 100 MG PO TABS: 100.0000 mg | ORAL_TABLET | Freq: Two times a day (BID) | ORAL | 0 refills | Status: DC
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
## 2020-02-09 NOTE — Discharge Summary (Signed)
Physician Discharge Summary  KATAYA GUIMONT EPP:[id number] DOB: [birth year] DOA: 02/03/2020  PCP: Volney American, PA-C  Admit date: 02/03/2020 Discharge date: 02/09/2020  Time spent: 60 minutes  Recommendations for Outpatient Follow-up:  1. Patient be discharged home with home health therapies. 2. Follow-up with Dr. Rudene Christians, orthopedics in 2 weeks, February 21, 2020 3. Follow-up with Dorise Hiss, PA orthopedics on 02/16/2020 for Praveena removal, wound check, dressing change. 4. Follow-up with Dr. Ola Spurr, ID in 2 weeks.  Prior hepatitis C treatment will need to be reassessed on follow-up. 5. Follow-up with Volney American, PA-C in 2 weeks.  On follow-up patient will need a comprehensive metabolic profile done to follow-up on electrolytes, renal function, LFTs(while on fluconazole).  Will also need a CBC done to follow-up on iron deficiency anemia.   Discharge Diagnoses:  Principal Problem:   Sinus tachycardia Active Problems:   Chronic hepatitis C without hepatic coma (HCC)   Alcohol abuse   Tobacco abuse   Alcohol withdrawal delirium (HCC)   Knee pain, right   Sepsis (HCC)   COPD (chronic obstructive pulmonary disease) (HCC)   HTN (hypertension)   Hepatic encephalopathy (HCC)   S/P TKR (total knee replacement) using cement, right   Wound dehiscence, surgical, initial encounter   Anemia   Discharge Condition: Stable and improved  Diet recommendation: Heart healthy  Filed Weights   02/03/20 0939  Weight: 63.5 kg    History of present illness:  HPI per Dr. Nuala Alpha  is a 44 y.o. female with a known history of alcohol abuse (per husband has drank two beers in the last three weeks), anxiety/depression, history of hepatitis C, narcotic abuse/polysubstance abuse, cirrhosis of liver comes to the emergency room with redness, bleeding, pain and difficulty ambulating with right knee wound dehiscence.  Patient is unable to give any history review of systems.  Husband at bedside. Patient was seen in orthopedic office on 24th August did have someone days since with minimal discharge. No fever. Was prescribed Bactrim and narcotics. Husband cannot tell if patient fell at home.  ED course: patient is afebrile, pulse 123 to 126, respiratory rate 21, systolic blood pressure 606/30  x-ray right knee shows 1. Status post right total knee arthroplasty. No hardware complications identified at this time. 2. Soft tissue swelling and gas compatible with history of wound Dehiscence.  White count 5.3, lactic acid 1.8  sodium 133  Patient is being admitted for sepsis secondary to right knee surgical wound infection  Hospital Course:  1 sepsis secondary to right knee wound infection after recent right total knee arthroplasty/medical noncompliance, POA Patient had presented with sepsis physiology which improved during her hospitalization. Orthopedics consulted and patient subsequently underwent surgery by orthopedics 02/04/2020 for infected right knee with ruptured patella tendon status post knee revision, irrigation and debridement.  Wound VAC in place.  Blood cultures with no growth to date.  Wound cultures with rare Staph epidermidis, rare yeast.  Patient was on IV Zosyn and was changed to IV cefepime.  Patient also maintained on IV vancomycin. Diflucan added to regimen.  Patient was seen and followed by ID throughout the hospitalization.  Patient's pain was adequately managed on OxyContin, oxycodone IR every 6 hours as needed.  Patient initially was on IV morphine which was subsequently discontinued.  ID had recommended to continue IV vancomycin and fluconazole during the hospitalization.  IV cefepime subsequently discontinued.  ID recommended on discharge patient be discharged on doxycycline 100 mg twice daily x6 weeks as  well as oral fluconazole 400 mg daily x6 weeks.  Prescriptions for antibiotics were written by orthopedics on day of discharge.  Patient will  follow up with orthopedics and ID in the outpatient setting.  Patient will be discharged with a wound VAC.  Patient will be discharged in stable and improved condition.   2.  Anemia of unknown etiology/postop acute blood loss anemia Likely postop acute blood loss anemia.  Hemoglobin went as low as 6.5.  Status post transfusion 1 unit packed red blood cells 02/06/2020.  Hemoglobin stabilized and was 7.8 by day of discharge.  Anemia panel which was done was consistent with anemia of chronic disease as well as iron deficiency anemia.  Patient placed on oral iron supplementation which should be discharged home on.  Outpatient follow-up with PCP.   3.  History of liver cirrhosis/polysubstance abuse/alcohol use/hepatitis C Alcohol cessation stressed to patient.    Patient maintained on home regimen of lactulose, Lasix, Aldactone, folic acid, multivitamin, thiamine.  Unsure of previous hepatitis C treatment however that can be followed in the outpatient setting with ID.   4.  History of COPD Stable.  Bronchodilators as needed.  5.  Hypertension Remained stable on patient's home regimen of Toprol-XL, Aldactone, Lasix.  Outpatient follow-up.   6.  Hypokalemia Repleted.  Patient placed back on home regimen of oral daily potassium supplementation.  Outpatient follow-up with PCP.   Procedures:  Plain films of the right knee 02/03/2020, 02/04/2020  Right total knee revision, irrigation and debridement Dr. Rudene Christians 02/04/2020  Transfusion 1 unit packed red blood cells 02/06/2020   Consultations:  Infectious disease: Dr. Ola Spurr 02/05/2020  Orthopedics: Dr. Rudene Christians 02/03/2020  Discharge Exam: Vitals:   02/09/20 0228 02/09/20 0802  BP:  128/89  Pulse:  98  Resp:  20  Temp: 98.9 F (37.2 C) 98.5 F (36.9 C)  SpO2:  98%    General: NAD Cardiovascular: RRR Respiratory: CTAB  Discharge Instructions   Discharge Instructions    Diet - low sodium heart healthy   Complete by: As directed     Discharge wound care:   Complete by: As directed    As per orthopedics .   Increase activity slowly   Complete by: As directed      Allergies as of 02/09/2020      Reactions   Tylenol [acetaminophen] Other (See Comments)   Liver disease      Medication List    STOP taking these medications   traMADol 50 MG tablet Commonly known as: ULTRAM     TAKE these medications   aspirin 81 MG chewable tablet Chew 1 tablet (81 mg total) by mouth 2 (two) times daily. Start taking on: February 16, 2020 What changed: These instructions start on February 16, 2020. If you are unsure what to do until then, ask your doctor or other care provider. Notes to patient: Not given while in hospital.     Blood Pressure Monitor Misc For automatic blood pressure cuff.   dicyclomine 20 MG tablet Commonly known as: BENTYL Take 1 tablet (20 mg total) by mouth 4 (four) times daily -  before meals and at bedtime.   doxycycline 100 MG tablet Commonly known as: ADOXA Take 1 tablet (100 mg total) by mouth 2 (two) times daily.   DULoxetine 20 MG capsule Commonly known as: CYMBALTA Take 1 capsule (20 mg total) by mouth daily.   ferrous sulfate 325 (65 FE) MG EC tablet Take 1 tablet (325 mg total) by mouth daily  with breakfast.   fluconazole 200 MG tablet Commonly known as: DIFLUCAN Take 2 tablets (400 mg total) by mouth daily.   folic acid 1 MG tablet Commonly known as: FOLVITE Take 1 tablet (1 mg total) by mouth daily.   furosemide 20 MG tablet Commonly known as: LASIX Take 20 mg by mouth daily.   gabapentin 300 MG capsule Commonly known as: NEURONTIN Take 1 capsule (300 mg total) by mouth 3 (three) times daily as needed (pain.).   hydrOXYzine 25 MG tablet Commonly known as: ATARAX/VISTARIL Take 1 tablet (25 mg total) by mouth 3 (three) times daily as needed (anxiety.).   ibuprofen 200 MG tablet Commonly known as: ADVIL Take 1 tablet (200 mg total) by mouth every 6 (six) hours as  needed for fever or moderate pain.   lactulose 10 GM/15ML solution Commonly known as: CHRONULAC Take 45 mLs (30 g total) by mouth 3 (three) times daily. What changed: when to take this   magnesium oxide 400 (241.3 Mg) MG tablet Commonly known as: MAG-OX Take 1 tablet (400 mg total) by mouth daily.   metoprolol succinate 25 MG 24 hr tablet Commonly known as: TOPROL-XL Take 0.5 tablets (12.5 mg total) by mouth daily.   midodrine 5 MG tablet Commonly known as: PROAMATINE Take 1 tablet (5 mg total) by mouth in the morning and at bedtime. Pt is taking as needed bp is good   multivitamin with minerals Tabs tablet Take 1 tablet by mouth daily.   OLANZapine 10 MG tablet Commonly known as: ZYPREXA Take 1 tablet (10 mg total) by mouth at bedtime.   ondansetron 4 MG tablet Commonly known as: ZOFRAN Take 1 tablet (4 mg total) by mouth every 6 (six) hours as needed for nausea.   oxyCODONE 5 MG immediate release tablet Commonly known as: Oxy IR/ROXICODONE Take 1 tablet (5 mg total) by mouth every 6 (six) hours as needed for severe pain. What changed:   when to take this  reasons to take this   oxyCODONE 10 mg 12 hr tablet Commonly known as: OXYCONTIN Take 1 tablet (10 mg total) by mouth every 12 (twelve) hours for 7 days. What changed: You were already taking a medication with the same name, and this prescription was added. Make sure you understand how and when to take each.   pantoprazole 40 MG tablet Commonly known as: PROTONIX Take 1 tablet (40 mg total) by mouth daily.   potassium chloride SA 20 MEQ tablet Commonly known as: KLOR-CON Take 1 tablet (20 mEq total) by mouth daily.   senna 8.6 MG Tabs tablet Commonly known as: SENOKOT Take 1 tablet (8.6 mg total) by mouth 2 (two) times daily.   spironolactone 25 MG tablet Commonly known as: ALDACTONE Take 25 mg by mouth daily.   sucralfate 1 g tablet Commonly known as: Carafate Take 1 tablet (1 g total) by mouth 4  (four) times daily -  with meals and at bedtime.   thiamine 100 MG tablet Take 1 tablet (100 mg total) by mouth daily.   triamcinolone cream 0.1 % Commonly known as: KENALOG Apply 1 application topically 2 (two) times daily as needed (skin irritation).   vitamin B-12 500 MCG tablet Commonly known as: CYANOCOBALAMIN Take 1 tablet (500 mcg total) by mouth daily.   zolpidem 10 MG tablet Commonly known as: AMBIEN Take 10 mg by mouth at bedtime as needed.            Durable Medical Equipment  (From admission, onward)  Start     Ordered   02/08/20 1215  For home use only DME Crutches  Once        02/08/20 1214           Discharge Care Instructions  (From admission, onward)         Start     Ordered   02/09/20 0000  Discharge wound care:       Comments: As per orthopedics .   02/09/20 1235         Allergies  Allergen Reactions  . Tylenol [Acetaminophen] Other (See Comments)    Liver disease    Follow-up Information    Hessie Knows, MD Follow up in 2 week(s).   Specialty: Orthopedic Surgery Why: sept 15th @ 10:15a for dressing change Contact information: [place] [address] [phone]        Duanne Guess, [place]. Schedule an appointment as soon as possible for a visit on 02/16/2020.   Specialties: Orthopedic Surgery, Emergency Medicine Why: For Praveena removal, wound check, dressing change Contact information: [place] [address] [phone]        Volney American, [place]. Schedule an appointment as soon as possible for a visit in 2 week(s).   Specialty: Family Medicine Contact information: [place] [address] [phone]        Nelva Bush, MD .   Specialty: Cardiology Contact information: [address] [place] [address] [phone]        Leonel Ramsay, MD. Schedule an appointment as soon as  possible for a visit in 2 week(s).   Specialty: Infectious Diseases Contact information: [place] [address] [phone]                The results of significant diagnostics from this hospitalization (including imaging, microbiology, ancillary and laboratory) are listed below for reference.    Significant Diagnostic Studies: DG Chest 2 View  Result Date: 01/16/2020 CLINICAL DATA:  Follow-up pneumonia EXAM: CHEST - 2 VIEW COMPARISON:  01/14/2020 FINDINGS: Cardiac shadow is within normal limits. The lungs are well aerated bilaterally. Persistent bilateral micro nodular opacities are noted within both lungs left greater than right slightly increased when compared with the prior exam. No sizable effusion is noted. No bony abnormality is seen. IMPRESSION: Slight increase in the degree of parenchymal opacities bilaterally. Follow-up is recommended following appropriate therapy. Electronically Signed   By: Inez Catalina M.D.   On: 01/16/2020 10:09   DG Chest 2 View  Result Date: 01/14/2020 CLINICAL DATA:  Smoker, recent knee surgery, fever EXAM: CHEST - 2 VIEW COMPARISON:  11/30/2019 chest radiograph. FINDINGS: Stable cardiomediastinal silhouette with normal heart size. No pneumothorax. No pleural effusion. Faint hazy micronodular opacities in the lower lungs. IMPRESSION: Faint hazy micronodular opacities in the lower lungs, suggesting bronchiolitis/developing bronchopneumonia. Short-term chest radiograph follow-up advised. Electronically Signed   By: Ilona Sorrel M.D.   On: 01/14/2020 11:19   DG Knee 1-2 Views Right  Result Date: 02/04/2020 CLINICAL DATA:  Postop pain EXAM: RIGHT KNEE - 1-2 VIEW COMPARISON:  February 03, 2020 FINDINGS: The tibial and femoral hardware from the previous knee replacement is been removed. Antibiotic beads have been placed into the cavities left by the removed hardware. Large joint effusion. Apparent fracture fragment adjacent to the lateral  aspect of the proximal tibia. Apparent fracture line extending through the proximal tibia. IMPRESSION: 1. Removal  of hardware with antibiotic beads placed. 2. Apparent fracture fragment adjacent to the lateral aspect of the proximal tibia. Subtle fracture line extending through the tibial metaphysis. Electronically Signed   By: Dorise Bullion III M.D   On: 02/04/2020 13:56   DG Knee 1-2 Views Right  Result Date: 02/03/2020 CLINICAL DATA:  Status post right knee arthroplasty on 01/24/2020 now with surgical wound dehiscence. EXAM: RIGHT KNEE - 1-2 VIEW COMPARISON:  01/28/2020 . FINDINGS: Postoperative changes from right total knee arthroplasty identified. The hardware components remain in anatomic alignment. No periprosthetic fracture or dislocation. There is diffuse soft tissue swelling identified. Gas is identified within the surrounding soft tissues compatible with the history of wound dehiscence IMPRESSION: 1. Status post right total knee arthroplasty. No hardware complications identified at this time. 2. Soft tissue swelling and gas compatible with history of wound dehiscence. Electronically Signed   By: Kerby Moors M.D.   On: 02/03/2020 11:18   DG Knee 2 Views Right  Result Date: 01/28/2020 CLINICAL DATA:  Right knee arthroplasty 01/11/2020, wound dehiscence, erythema, drainage EXAM: RIGHT KNEE - 1-2 VIEW COMPARISON:  A 521 FINDINGS: Frontal and cross-table lateral views of the right knee are obtained. The right knee arthroplasty seen previously is in stable position with no evidence of acute complication. There are no fractures. The subcutaneous gas and intra-articular gas seen on prior study have resolved in the interim. There is persistent soft tissue edema, most pronounced anteriorly. Small residual joint effusion. IMPRESSION: 1. Persistent but improving soft tissue edema throughout the right knee. 2. No acute bony abnormality. 3. No evidence of complication after recent arthroplasty.  Electronically Signed   By: Randa Ngo M.D.   On: 01/28/2020 15:20   DG Knee 1-2 Views Right  Result Date: 01/11/2020 CLINICAL DATA:  Total right knee replacement. EXAM: RIGHT KNEE - 1-2 VIEW COMPARISON:  11/23/2019. FINDINGS: Total right knee replacement.  Hardware intact.  Anatomic alignment. IMPRESSION: Total right knee replacement with anatomic alignment. Electronically Signed   By: Marcello Moores  Register   On: 01/11/2020 13:47   US Venous Img Lower Unilateral Right (DVT)  Result Date: 01/14/2020 CLINICAL DATA:  44 year old with right leg swelling. EXAM: RIGHT LOWER EXTREMITY VENOUS DOPPLER ULTRASOUND TECHNIQUE: Gray-scale sonography with graded compression, as well as color Doppler and duplex ultrasound were performed to evaluate the lower extremity deep venous systems from the level of the common femoral vein and including the common femoral, femoral, profunda femoral, popliteal and calf veins including the posterior tibial, peroneal and gastrocnemius veins when visible. The superficial great saphenous vein was also interrogated. Spectral Doppler was utilized to evaluate flow at rest and with distal augmentation maneuvers in the common femoral, femoral and popliteal veins. COMPARISON:  12/10/2019 FINDINGS: Contralateral Common Femoral Vein: Respiratory phasicity is normal and symmetric with the symptomatic side. No evidence of thrombus. Normal compressibility. Common Femoral Vein: No evidence of thrombus. Normal compressibility, respiratory phasicity and response to augmentation. Saphenofemoral Junction: No evidence of thrombus. Normal compressibility and flow on color Doppler imaging. Profunda Femoral Vein: No evidence of thrombus. Normal compressibility and flow on color Doppler imaging. Femoral Vein: No evidence of thrombus. Normal compressibility, respiratory phasicity and response to augmentation. Popliteal Vein: No evidence of thrombus. Normal compressibility, respiratory phasicity and response to  augmentation. Calf Veins: Limited evaluation of the deep calf veins. No clear evidence of thrombus. Other Findings: Poorly defined heterogeneous structure along the medial side of the right knee at the area of redness and pain. This structure roughly  measures 5.7 x 1.8 x 5.7 cm. This structure is slightly hyperechoic. No significant vascular flow within this structure. IMPRESSION: 1. Negative for deep venous thrombosis in right lower extremity. Limited evaluation of the right deep calf veins due to the edema. 2. Poorly defined heterogeneous structure along the medial aspect of the right knee at the area of pain and redness. Structure is indeterminate but favor hematoma. Recommend clinical correlation. Electronically Signed   By: Markus Daft M.D.   On: 01/14/2020 19:48    Microbiology: Recent Results (from the past 240 hour(s))  Blood culture (routine x 2)     Status: None   Collection Time: 02/03/20  9:48 AM   Specimen: BLOOD  Result Value Ref Range Status   Specimen Description BLOOD RFA  Final   Special Requests   Final    BOTTLES DRAWN AEROBIC AND ANAEROBIC Blood Culture adequate volume   Culture   Final    NO GROWTH 5 DAYS Performed at [facility], [address] [place], [place] [address]    Report Status 02/08/2020 FINAL  Final  Blood culture (routine x 2)     Status: None   Collection Time: 02/03/20  9:50 AM   Specimen: BLOOD  Result Value Ref Range Status   Specimen Description BLOOD RT UPPER ARM  Final   Special Requests   Final    BOTTLES DRAWN AEROBIC AND ANAEROBIC Blood Culture results may not be optimal due to an inadequate volume of blood received in culture bottles   Culture   Final    NO GROWTH 5 DAYS Performed at [facility], [address], [place], [place] [address]    Report Status 02/08/2020 FINAL  Final  SARS Coronavirus 2 by RT PCR (hospital order, performed in [facility] hospital lab) Nasopharyngeal Nasopharyngeal Swab     Status: None    Collection Time: 02/03/20  9:51 AM   Specimen: Nasopharyngeal Swab  Result Value Ref Range Status   SARS Coronavirus 2 NEGATIVE NEGATIVE Final    Comment: (NOTE) SARS-CoV-2 target nucleic acids are NOT DETECTED.  The SARS-CoV-2 RNA is generally detectable in upper and lower respiratory specimens during the acute phase of infection. The lowest concentration of SARS-CoV-2 viral copies this assay can detect is 250 copies / mL. A negative result does not preclude SARS-CoV-2 infection and should not be used as the sole basis for treatment or other patient management decisions.  A negative result may occur with improper specimen collection / handling, submission of specimen other than nasopharyngeal swab, presence of viral mutation(s) within the areas targeted by this assay, and inadequate number of viral copies (<250 copies / mL). A negative result must be combined with clinical observations, patient history, and epidemiological information.  Fact Sheet for Patients:   StrictlyIdeas.no  Fact Sheet for Healthcare Providers: BankingDealers.co.za  This test is not yet approved or  cleared by the [place] FDA and has been authorized for detection and/or diagnosis of SARS-CoV-2 by FDA under an Emergency Use Authorization (EUA).  This EUA will remain in effect (meaning this test can be used) for the duration of the COVID-19 declaration under Section 564(b)(1) of the Act, 21 U.S.C. section 360bbb-3(b)(1), unless the authorization is terminated or revoked sooner.  Performed at [facility], [address] [place], [place] [address]   Aerobic/Anaerobic Culture (surgical/deep wound)     Status: None (Preliminary result)   Collection Time: 02/04/20  9:23 AM   Specimen: PATH Other; Tissue  Result  Value Ref Range Status   Specimen Description   Final    KNEE IRRIGATION AND DEBRIDEMENT RIGHT KNEE Performed at [facility],  [address], [place], [place] [address]    Special Requests   Final    NONE Performed at [facility], [address] [place], [place] [address]    Gram Stain   Final    NO WBC SEEN NO ORGANISMS SEEN Performed at [facility], [place] [address], [place], [place] [address]    Culture   Final    RARE STAPHYLOCOCCUS EPIDERMIDIS RARE CANDIDA PARAPSILOSIS NO ANAEROBES ISOLATED; CULTURE IN PROGRESS FOR 5 DAYS    Report Status PENDING  Incomplete   Organism ID, Bacteria STAPHYLOCOCCUS EPIDERMIDIS  Final      Susceptibility   Staphylococcus epidermidis - MIC*    CIPROFLOXACIN <=0.5 SENSITIVE Sensitive     ERYTHROMYCIN >=8 RESISTANT Resistant     GENTAMICIN <=0.5 SENSITIVE Sensitive     OXACILLIN >=4 RESISTANT Resistant     TETRACYCLINE 2 SENSITIVE Sensitive     VANCOMYCIN 1 SENSITIVE Sensitive     TRIMETH/SULFA 160 RESISTANT Resistant     CLINDAMYCIN <=0.25 SENSITIVE Sensitive     RIFAMPIN <=0.5 SENSITIVE Sensitive     Inducible Clindamycin NEGATIVE Sensitive     * RARE STAPHYLOCOCCUS EPIDERMIDIS  Aerobic/Anaerobic Culture (surgical/deep wound)     Status: None (Preliminary result)   Collection Time: 02/04/20  9:23 AM   Specimen: PATH Other; Tissue  Result Value Ref Range Status   Specimen Description   Final    KNEE Performed at [facility], [address] [place], [place] [address]    Special Requests   Final    KNEE RIGHT KNEE Performed at [facility], [address] [place], [place] [address]    Gram Stain   Final    FEW WBC PRESENT, PREDOMINANTLY MONONUCLEAR NO ORGANISMS SEEN Performed at [facility], [place] [address], [place], [place] [address]    Culture   Final    RARE STAPHYLOCOCCUS EPIDERMIDIS RARE YEAST IDENTIFICATION TO FOLLOW NO ANAEROBES ISOLATED; CULTURE IN PROGRESS FOR 5 DAYS    Report Status PENDING  Incomplete   Organism ID, Bacteria STAPHYLOCOCCUS EPIDERMIDIS  Final      Susceptibility   Staphylococcus  epidermidis - MIC*    CIPROFLOXACIN <=0.5 SENSITIVE Sensitive     ERYTHROMYCIN >=8 RESISTANT Resistant     GENTAMICIN <=0.5 SENSITIVE Sensitive     OXACILLIN >=4 RESISTANT Resistant     TETRACYCLINE <=1 SENSITIVE Sensitive     VANCOMYCIN 1 SENSITIVE Sensitive     TRIMETH/SULFA 160 RESISTANT Resistant     CLINDAMYCIN <=0.25 SENSITIVE Sensitive     RIFAMPIN <=0.5 SENSITIVE Sensitive     Inducible Clindamycin NEGATIVE Sensitive     * RARE STAPHYLOCOCCUS EPIDERMIDIS     Labs: Basic Metabolic Panel: Recent Labs  Lab 02/03/20 0951 02/04/20 1646 02/05/20 0450 02/06/20 0227 02/07/20 0502 02/08/20 0337 02/09/20 0328  NA   < >  --  137 140 138 138 138  K   < >  --  3.7 3.7 3.5 3.3* 4.2  CL   < >  --  107 109 110 106 107  CO2   < >  --  25 27 23 26 27   GLUCOSE   < >  --  153* 115* 116* 109* 126*  BUN   < >  --  9 10 7 8 10   CREATININE   < >  --  0.46 0.41* 0.42* 0.39* 0.33*  CALCIUM   < >  --  8.4* 7.6* 7.3* 7.4* 8.3*  MG  --  1.6*  --  1.7 1.7 2.0 1.8  PHOS  --  4.4  --   --   --   --   --    < > = values in this interval not displayed.   Liver Function Tests: Recent Labs  Lab 02/03/20 0951 02/05/20 0450  AST 39 33  ALT 20 18  ALKPHOS 106 87  BILITOT 1.4* 1.2  PROT 7.1 6.2*  ALBUMIN 2.9* 2.4*   No results for input(s): LIPASE, AMYLASE in the last 168 hours. Recent Labs  Lab 02/03/20 1511 02/05/20 0450  AMMONIA 31 57*   CBC: Recent Labs  Lab 02/03/20 0951 02/04/20 1646 02/05/20 0450 02/06/20 0227 02/07/20 0502 02/08/20 0337 02/09/20 0328  WBC 5.3   < > 5.2 5.8 5.1 5.7 6.1  NEUTROABS 3.9  --   --   --   --   --   --   HGB 8.6*   < > 7.0* 6.5* 7.1* 7.5* 7.8*  HCT 26.4*   < > 21.8* 19.5* 22.0* 22.8* 24.0*  MCV 82.8   < > 83.2 81.9 85.3 82.3 83.0  PLT 173   < > 139* 129* 107* 121* 122*   < > = values in this interval not displayed.   Cardiac Enzymes: No results for input(s): CKTOTAL, CKMB, CKMBINDEX, TROPONINI in the last 168 hours. BNP: BNP (last 3  results) Recent Labs    12/10/19 1305  BNP 52.5    ProBNP (last 3 results) No results for input(s): PROBNP in the last 8760 hours.  CBG: Recent Labs  Lab 02/06/20 1159  GLUCAP 131*       Signed:  Irine Seal MD.  [facility] 02/09/2020, 12:48 PM

## 2020-02-09 NOTE — Progress Notes (Signed)
Pt d/c home via private vehicle.  D/c paperwork reviewed with pt and she expressed understanding.  Wound vac was switched to portable vac.  All belongings taken at time of d/c including crutches.  IV removed from pts L forearm without issue.  Pt was wheeled down to med mall and assisted into car by NT.

## 2020-02-09 NOTE — Progress Notes (Addendum)
Subjective: 5 Days Post-Op Procedure(s) (LRB): TOTAL KNEE REVISION (Right) IRRIGATION AND DEBRIDEMENT KNEE (Right) Patient reports pain as moderate.  Patient with no other complaints.  Denies any cough,CP, SOB, ABD pain. We will continue with therapy today.    Objective: Vital signs in last 24 hours: Temp:  [98.5 F (36.9 C)-100.4 F (38 C)] 98.5 F (36.9 C) (09/03 0802) Pulse Rate:  [88-110] 98 (09/03 0802) Resp:  [16-20] 20 (09/03 0802) BP: (116-133)/(62-89) 128/89 (09/03 0802) SpO2:  [97 %-100 %] 98 % (09/03 0802)  Intake/Output from previous day: 09/02 0701 - 09/03 0700 In: 3017 [P.O.:960; I.V.:1204.4; IV Piggyback:852.7] Out: 2800 [Urine:2800] Intake/Output this shift: No intake/output data recorded.  Recent Labs    02/07/20 0502 02/08/20 0337 02/09/20 0328  HGB 7.1* 7.5* 7.8*   Recent Labs    02/08/20 0337 02/09/20 0328  WBC 5.7 6.1  RBC 2.77* 2.89*  HCT 22.8* 24.0*  PLT 121* 122*   Recent Labs    02/08/20 0337 02/09/20 0328  NA 138 138  K 3.3* 4.2  CL 106 107  CO2 26 27  BUN 8 10  CREATININE 0.39* 0.33*  GLUCOSE 109* 126*  CALCIUM 7.4* 8.3*   No results for input(s): LABPT, INR in the last 72 hours.  EXAM General - Patient is Alert, Appropriate and Oriented Extremity - Neurovascular intact Sensation intact distally Intact pulses distally Dorsiflexion/Plantar flexion intact No cellulitis present Compartment soft Dressing - dressing C/D/I , prevena intact with 225 cc drainage. Motor Function - intact, moving foot and toes well on exam.   Past Medical History:  Diagnosis Date  . Alcohol abuse   . Anxiety   . Back injury   . Cervical cancer ([place])   . Charcot-Marie-Tooth disease   . COPD (chronic obstructive pulmonary disease) ([place])   . Family history of adverse reaction to anesthesia    PONV  . GERD (gastroesophageal reflux disease)   . Hepatitis    liver fibrosis, Hep C negative on 09/3019  . Hypertension   . Hypokalemia   .  IDA (iron deficiency anemia) 06/26/2019  . Iron deficiency anemia   . Leg injury   . Liver cirrhosis ([place])   . Pneumonia   . Sepsis ([place]) 07/10/2019  . Symptomatic anemia 06/26/2019  . Thrombocytopenia (HCC)     Assessment/Plan:   5 Days Post-Op Procedure(s) (LRB): TOTAL KNEE REVISION (Right) IRRIGATION AND DEBRIDEMENT KNEE (Right) Principal Problem:   Sinus tachycardia Active Problems:   Chronic hepatitis C without hepatic coma (HCC)   Alcohol abuse   Tobacco abuse   Alcohol withdrawal delirium (HCC)   Knee pain, right   Sepsis (HCC)   COPD (chronic obstructive pulmonary disease) (HCC)   HTN (hypertension)   Hepatic encephalopathy (HCC)   S/P TKR (total knee replacement) using cement, right   Wound dehiscence, surgical, initial encounter   Anemia   Acute post op blood loss anemia with underlying chronic anemia   Estimated body mass index is 25.61 kg/m as calculated from the following:   Height as of this encounter: 5\' 2"  (1.575 m).   Weight as of this encounter: 63.5 kg. Advance diet Up with therapy, PWB (50%) RLE with knee immobilizer on at all times Acute post op blood loss anemia with underlying chronic anemia - Hgb7.8, trending up. S/p 1 unit of PRBC 02/06/20. Continue with IV abx per ID Okay to discharge patient home today with antibiotics per infectious disease Prescriptions for doxycycline, fluconazole, oxycodone, OxyContin are printed and in  patient's chart  Follow-up with [facility] orthopedics 02/16/2020 for removal of Praveena Follow-up with [facility] orthopedics 02/21/2020 for staple removal Patient should wear knee immobilizer at all times.  Partial weightbearing right lower extremity    DVT Prophylaxis - TED hose and SCDs    T. Rachelle Hora, PA-C [place] 02/09/2020, 8:12 AM

## 2020-02-09 NOTE — Discharge Instructions (Signed)
Diet: As you were doing prior to hospitalization   Dressing: Praveena negative pressure dressing needs to stay clean and dry.  Wear knee immobilizer at all times.  Follow-up with the clinic on 02/16/2020 for wound check  Activity: 50% weightbearing left lower extremity with knee immobilizer on at all times  Weight Bearing:   50% weightbearing left lower extremity  To prevent constipation: you may use a stool softener such as -  Colace (over the counter) 100 mg by mouth twice a day  Drink plenty of fluids (prune juice may be helpful) and high fiber foods Miralax (over the counter) for constipation as needed.    Itching:  If you experience itching with your medications, try taking only a single pain pill, or even half a pain pill at a time.  You may take up to 10 pain pills per day, and you can also use benadryl over the counter for itching or also to help with sleep.   Precautions:  If you experience chest pain or shortness of breath - call 911 immediately for transfer to the hospital emergency department!!  If you develop a fever greater that 101 F, purulent drainage from wound, increased redness or drainage from wound, or calf pain-Call [place]                                              Follow- Up Appointment:  Please call for an appointment to be seen in 1 week at [facility]

## 2020-02-14 ENCOUNTER — Encounter: Payer: Self-pay | Admitting: Emergency Medicine

## 2020-02-14 ENCOUNTER — Other Ambulatory Visit: Payer: Self-pay

## 2020-02-14 ENCOUNTER — Emergency Department: Payer: Medicaid Other

## 2020-02-14 ENCOUNTER — Inpatient Hospital Stay
Admission: EM | Admit: 2020-02-14 | Discharge: 2020-03-05 | DRG: 474 | Disposition: A | Discharge status: Home / Self Care | Payer: Medicaid Other | Attending: Internal Medicine | Admitting: Internal Medicine

## 2020-02-14 DIAGNOSIS — Z7982 Long term (current) use of aspirin: Secondary | ICD-10-CM

## 2020-02-14 DIAGNOSIS — K7031 Alcoholic cirrhosis of liver with ascites: Secondary | ICD-10-CM | POA: Diagnosis not present

## 2020-02-14 DIAGNOSIS — M25561 Pain in right knee: Secondary | ICD-10-CM | POA: Diagnosis not present

## 2020-02-14 DIAGNOSIS — B182 Chronic viral hepatitis C: Secondary | ICD-10-CM | POA: Diagnosis present

## 2020-02-14 DIAGNOSIS — S83101A Unspecified subluxation of right knee, initial encounter: Secondary | ICD-10-CM | POA: Diagnosis not present

## 2020-02-14 DIAGNOSIS — Z8541 Personal history of malignant neoplasm of cervix uteri: Secondary | ICD-10-CM | POA: Diagnosis not present

## 2020-02-14 DIAGNOSIS — M869 Osteomyelitis, unspecified: Secondary | ICD-10-CM | POA: Diagnosis not present

## 2020-02-14 DIAGNOSIS — F1020 Alcohol dependence, uncomplicated: Secondary | ICD-10-CM | POA: Diagnosis present

## 2020-02-14 DIAGNOSIS — R6520 Severe sepsis without septic shock: Secondary | ICD-10-CM | POA: Diagnosis not present

## 2020-02-14 DIAGNOSIS — M21961 Unspecified acquired deformity of right lower leg: Secondary | ICD-10-CM

## 2020-02-14 DIAGNOSIS — R5081 Fever presenting with conditions classified elsewhere: Secondary | ICD-10-CM | POA: Diagnosis not present

## 2020-02-14 DIAGNOSIS — F411 Generalized anxiety disorder: Secondary | ICD-10-CM | POA: Diagnosis not present

## 2020-02-14 DIAGNOSIS — T84092A Other mechanical complication of internal right knee prosthesis, initial encounter: Secondary | ICD-10-CM | POA: Diagnosis present

## 2020-02-14 DIAGNOSIS — Z8249 Family history of ischemic heart disease and other diseases of the circulatory system: Secondary | ICD-10-CM | POA: Diagnosis not present

## 2020-02-14 DIAGNOSIS — F17210 Nicotine dependence, cigarettes, uncomplicated: Secondary | ICD-10-CM | POA: Diagnosis present

## 2020-02-14 DIAGNOSIS — S83104A Unspecified dislocation of right knee, initial encounter: Secondary | ICD-10-CM

## 2020-02-14 DIAGNOSIS — G600 Hereditary motor and sensory neuropathy: Secondary | ICD-10-CM | POA: Diagnosis present

## 2020-02-14 DIAGNOSIS — Y831 Surgical operation with implant of artificial internal device as the cause of abnormal reaction of the patient, or of later complication, without mention of misadventure at the time of the procedure: Secondary | ICD-10-CM | POA: Diagnosis present

## 2020-02-14 DIAGNOSIS — R5381 Other malaise: Secondary | ICD-10-CM | POA: Diagnosis not present

## 2020-02-14 DIAGNOSIS — Z20822 Contact with and (suspected) exposure to covid-19: Secondary | ICD-10-CM | POA: Diagnosis present

## 2020-02-14 DIAGNOSIS — K219 Gastro-esophageal reflux disease without esophagitis: Secondary | ICD-10-CM | POA: Diagnosis present

## 2020-02-14 DIAGNOSIS — R296 Repeated falls: Secondary | ICD-10-CM | POA: Diagnosis present

## 2020-02-14 DIAGNOSIS — G4700 Insomnia, unspecified: Secondary | ICD-10-CM | POA: Diagnosis present

## 2020-02-14 DIAGNOSIS — Z9114 Patient's other noncompliance with medication regimen: Secondary | ICD-10-CM | POA: Diagnosis not present

## 2020-02-14 DIAGNOSIS — K7460 Unspecified cirrhosis of liver: Secondary | ICD-10-CM | POA: Diagnosis present

## 2020-02-14 DIAGNOSIS — M79605 Pain in left leg: Secondary | ICD-10-CM | POA: Diagnosis not present

## 2020-02-14 DIAGNOSIS — M009 Pyogenic arthritis, unspecified: Secondary | ICD-10-CM | POA: Diagnosis not present

## 2020-02-14 DIAGNOSIS — R531 Weakness: Secondary | ICD-10-CM | POA: Diagnosis not present

## 2020-02-14 DIAGNOSIS — R7401 Elevation of levels of liver transaminase levels: Secondary | ICD-10-CM | POA: Diagnosis not present

## 2020-02-14 DIAGNOSIS — J9811 Atelectasis: Secondary | ICD-10-CM | POA: Diagnosis not present

## 2020-02-14 DIAGNOSIS — J449 Chronic obstructive pulmonary disease, unspecified: Secondary | ICD-10-CM | POA: Diagnosis present

## 2020-02-14 DIAGNOSIS — R7989 Other specified abnormal findings of blood chemistry: Secondary | ICD-10-CM | POA: Diagnosis not present

## 2020-02-14 DIAGNOSIS — M79662 Pain in left lower leg: Secondary | ICD-10-CM | POA: Diagnosis not present

## 2020-02-14 DIAGNOSIS — R600 Localized edema: Secondary | ICD-10-CM | POA: Diagnosis not present

## 2020-02-14 DIAGNOSIS — L03115 Cellulitis of right lower limb: Secondary | ICD-10-CM

## 2020-02-14 DIAGNOSIS — R52 Pain, unspecified: Secondary | ICD-10-CM | POA: Diagnosis not present

## 2020-02-14 DIAGNOSIS — E876 Hypokalemia: Secondary | ICD-10-CM | POA: Diagnosis not present

## 2020-02-14 DIAGNOSIS — S82101A Unspecified fracture of upper end of right tibia, initial encounter for closed fracture: Secondary | ICD-10-CM | POA: Diagnosis present

## 2020-02-14 DIAGNOSIS — I209 Angina pectoris, unspecified: Secondary | ICD-10-CM | POA: Diagnosis not present

## 2020-02-14 DIAGNOSIS — J439 Emphysema, unspecified: Secondary | ICD-10-CM | POA: Diagnosis not present

## 2020-02-14 DIAGNOSIS — S82201A Unspecified fracture of shaft of right tibia, initial encounter for closed fracture: Secondary | ICD-10-CM | POA: Diagnosis not present

## 2020-02-14 DIAGNOSIS — Y792 Prosthetic and other implants, materials and accessory orthopedic devices associated with adverse incidents: Secondary | ICD-10-CM | POA: Diagnosis present

## 2020-02-14 DIAGNOSIS — A419 Sepsis, unspecified organism: Secondary | ICD-10-CM | POA: Diagnosis not present

## 2020-02-14 DIAGNOSIS — I10 Essential (primary) hypertension: Secondary | ICD-10-CM | POA: Diagnosis present

## 2020-02-14 DIAGNOSIS — R079 Chest pain, unspecified: Secondary | ICD-10-CM | POA: Diagnosis present

## 2020-02-14 DIAGNOSIS — R4182 Altered mental status, unspecified: Secondary | ICD-10-CM | POA: Diagnosis not present

## 2020-02-14 DIAGNOSIS — F329 Major depressive disorder, single episode, unspecified: Secondary | ICD-10-CM | POA: Diagnosis present

## 2020-02-14 DIAGNOSIS — Z471 Aftercare following joint replacement surgery: Secondary | ICD-10-CM | POA: Diagnosis not present

## 2020-02-14 DIAGNOSIS — R509 Fever, unspecified: Secondary | ICD-10-CM | POA: Diagnosis not present

## 2020-02-14 DIAGNOSIS — M8660 Other chronic osteomyelitis, unspecified site: Secondary | ICD-10-CM | POA: Diagnosis present

## 2020-02-14 DIAGNOSIS — S82141A Displaced bicondylar fracture of right tibia, initial encounter for closed fracture: Secondary | ICD-10-CM | POA: Diagnosis not present

## 2020-02-14 DIAGNOSIS — R000 Tachycardia, unspecified: Secondary | ICD-10-CM | POA: Diagnosis not present

## 2020-02-14 DIAGNOSIS — R609 Edema, unspecified: Secondary | ICD-10-CM

## 2020-02-14 DIAGNOSIS — T8453XA Infection and inflammatory reaction due to internal right knee prosthesis, initial encounter: Principal | ICD-10-CM | POA: Diagnosis present

## 2020-02-14 DIAGNOSIS — M7989 Other specified soft tissue disorders: Secondary | ICD-10-CM | POA: Diagnosis not present

## 2020-02-14 DIAGNOSIS — M868X6 Other osteomyelitis, lower leg: Secondary | ICD-10-CM | POA: Diagnosis not present

## 2020-02-14 DIAGNOSIS — J90 Pleural effusion, not elsewhere classified: Secondary | ICD-10-CM | POA: Diagnosis not present

## 2020-02-14 DIAGNOSIS — D649 Anemia, unspecified: Secondary | ICD-10-CM | POA: Diagnosis present

## 2020-02-14 DIAGNOSIS — R400 Somnolence: Secondary | ICD-10-CM | POA: Diagnosis not present

## 2020-02-14 LAB — COMPREHENSIVE METABOLIC PANEL
ALT: 26 U/L (ref 0–44)
AST: 67 U/L — ABNORMAL HIGH (ref 15–41)
Albumin: 2.8 g/dL — ABNORMAL LOW (ref 3.5–5.0)
Alkaline Phosphatase: 109 U/L (ref 38–126)
Anion gap: 12 (ref 5–15)
BUN: 8 mg/dL (ref 6–20)
CO2: 21 mmol/L — ABNORMAL LOW (ref 22–32)
Calcium: 8.5 mg/dL — ABNORMAL LOW (ref 8.9–10.3)
Chloride: 105 mmol/L (ref 98–111)
Creatinine, Ser: 0.38 mg/dL — ABNORMAL LOW (ref 0.44–1.00)
GFR calc Af Amer: >60 mL/min (ref >60)
GFR calc non Af Amer: >60 mL/min (ref >60)
Glucose, Bld: 114 mg/dL — ABNORMAL HIGH (ref 70–99)
Potassium: 4 mmol/L (ref 3.5–5.1)
Sodium: 138 mmol/L (ref 135–145)
Total Bilirubin: 1.2 mg/dL (ref 0.3–1.2)
Total Protein: 7.2 g/dL (ref 6.5–8.1)

## 2020-02-14 LAB — CBC WITH DIFFERENTIAL/PLATELET
Abs Immature Granulocytes: 0.02 10*3/uL (ref 0.00–0.07)
Basophils Absolute: 0.1 10*3/uL (ref 0.0–0.1)
Basophils Relative: 1 %
Eosinophils Absolute: 0.1 10*3/uL (ref 0.0–0.5)
Eosinophils Relative: 1 %
HCT: 27.7 % — ABNORMAL LOW (ref 36.0–46.0)
Hemoglobin: 8.7 g/dL — ABNORMAL LOW (ref 12.0–15.0)
Immature Granulocytes: 0 %
Lymphocytes Relative: 20 %
Lymphs Abs: 1.1 10*3/uL (ref 0.7–4.0)
MCH: 26.4 pg (ref 26.0–34.0)
MCHC: 31.4 g/dL (ref 30.0–36.0)
MCV: 83.9 fL (ref 80.0–100.0)
Monocytes Absolute: 0.9 10*3/uL (ref 0.1–1.0)
Monocytes Relative: 16 %
Neutro Abs: 3.5 10*3/uL (ref 1.7–7.7)
Neutrophils Relative %: 62 %
Platelets: 172 10*3/uL (ref 150–400)
RBC: 3.3 MIL/uL — ABNORMAL LOW (ref 3.87–5.11)
RDW: 17.9 % — ABNORMAL HIGH (ref 11.5–15.5)
WBC: 5.7 10*3/uL (ref 4.0–10.5)
nRBC: 0 % (ref 0.0–0.2)

## 2020-02-14 LAB — PROTIME-INR
INR: 1.4 — ABNORMAL HIGH (ref 0.8–1.2)
Prothrombin Time: 16.6 seconds — ABNORMAL HIGH (ref 11.4–15.2)

## 2020-02-14 LAB — LACTIC ACID, PLASMA: Lactic Acid, Venous: 3.1 mmol/L (ref 0.5–1.9)

## 2020-02-14 MED ORDER — MORPHINE SULFATE (PF) 2 MG/ML IV SOLN
2.0000 mg | Freq: Once | INTRAVENOUS | Status: AC
  Administered 2020-02-14: 2 mg via INTRAVENOUS
  Filled 2020-02-14: qty 1

## 2020-02-14 MED ORDER — SODIUM CHLORIDE 0.9 % IV BOLUS
1000.0000 mL | Freq: Once | INTRAVENOUS | Status: AC
  Administered 2020-02-14: 1000 mL via INTRAVENOUS

## 2020-02-14 MED ORDER — PIPERACILLIN-TAZOBACTAM 3.375 G IVPB 30 MIN
3.3750 g | Freq: Once | INTRAVENOUS | Status: AC
  Administered 2020-02-14: 3.375 g via INTRAVENOUS
  Filled 2020-02-14: qty 50

## 2020-02-14 MED ORDER — VANCOMYCIN HCL IN DEXTROSE 1-5 GM/200ML-% IV SOLN
1000.0000 mg | Freq: Once | INTRAVENOUS | Status: AC
  Administered 2020-02-15: 1000 mg via INTRAVENOUS
  Filled 2020-02-14: qty 200

## 2020-02-14 MED ORDER — ONDANSETRON HCL 4 MG/2ML IJ SOLN
4.0000 mg | Freq: Once | INTRAMUSCULAR | Status: AC
  Administered 2020-02-14: 4 mg via INTRAVENOUS
  Filled 2020-02-14: qty 2
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
## 2020-02-14 NOTE — ED Notes (Signed)
MD Mansy at bedside. 

## 2020-02-15 ENCOUNTER — Other Ambulatory Visit (INDEPENDENT_AMBULATORY_CARE_PROVIDER_SITE_OTHER): Payer: Self-pay | Admitting: Vascular Surgery

## 2020-02-15 DIAGNOSIS — E876 Hypokalemia: Secondary | ICD-10-CM

## 2020-02-15 LAB — BASIC METABOLIC PANEL
Anion gap: 7 (ref 5–15)
BUN: 7 mg/dL (ref 6–20)
CO2: 24 mmol/L (ref 22–32)
Calcium: 8.1 mg/dL — ABNORMAL LOW (ref 8.9–10.3)
Chloride: 104 mmol/L (ref 98–111)
Creatinine, Ser: 0.39 mg/dL — ABNORMAL LOW (ref 0.44–1.00)
GFR calc Af Amer: >60 mL/min (ref >60)
GFR calc non Af Amer: >60 mL/min (ref >60)
Glucose, Bld: 93 mg/dL (ref 70–99)
Potassium: 3.4 mmol/L — ABNORMAL LOW (ref 3.5–5.1)
Sodium: 135 mmol/L (ref 135–145)

## 2020-02-15 LAB — LACTIC ACID, PLASMA: Lactic Acid, Venous: 1.6 mmol/L (ref 0.5–1.9)

## 2020-02-15 LAB — C-REACTIVE PROTEIN: CRP: 3.2 mg/dL — ABNORMAL HIGH (ref <1.0)

## 2020-02-15 LAB — SEDIMENTATION RATE: Sed Rate: 65 mm/hr — ABNORMAL HIGH (ref 0–20)

## 2020-02-15 LAB — SARS CORONAVIRUS 2 BY RT PCR (HOSPITAL ORDER, PERFORMED IN ~~LOC~~ HOSPITAL LAB): SARS Coronavirus 2: NEGATIVE

## 2020-02-15 LAB — AMMONIA: Ammonia: 20 umol/L (ref 9–35)

## 2020-02-15 LAB — CK: Total CK: 59 U/L (ref 38–234)

## 2020-02-15 MED ORDER — ONDANSETRON HCL 4 MG PO TABS: 4.0000 mg | ORAL_TABLET | Freq: Four times a day (QID) | ORAL | Status: DC | PRN

## 2020-02-15 MED ORDER — VANCOMYCIN HCL IN DEXTROSE 1-5 GM/200ML-% IV SOLN
1000.0000 mg | Freq: Two times a day (BID) | INTRAVENOUS | Status: DC
  Administered 2020-02-15 – 2020-02-18 (×6): 1000 mg via INTRAVENOUS
  Filled 2020-02-15 (×9): qty 200

## 2020-02-15 MED ORDER — CHLORHEXIDINE GLUCONATE CLOTH 2 % EX PADS
6.0000 | MEDICATED_PAD | Freq: Once | CUTANEOUS | Status: AC
  Administered 2020-02-15: 6 via TOPICAL

## 2020-02-15 MED ORDER — HYDROXYZINE HCL 25 MG PO TABS
25.0000 mg | ORAL_TABLET | Freq: Three times a day (TID) | ORAL | Status: DC | PRN
  Administered 2020-02-15 – 2020-03-05 (×7): 25 mg via ORAL
  Filled 2020-02-15 (×9): qty 1

## 2020-02-15 MED ORDER — PANTOPRAZOLE SODIUM 40 MG PO TBEC
40.0000 mg | DELAYED_RELEASE_TABLET | Freq: Every day | ORAL | Status: DC
  Administered 2020-02-15 – 2020-03-05 (×18): 40 mg via ORAL
  Filled 2020-02-15 (×19): qty 1

## 2020-02-15 MED ORDER — ENOXAPARIN SODIUM 40 MG/0.4ML ~~LOC~~ SOLN
40.0000 mg | SUBCUTANEOUS | Status: DC
  Administered 2020-02-15: 40 mg via SUBCUTANEOUS
  Filled 2020-02-15: qty 0.4

## 2020-02-15 MED ORDER — DICYCLOMINE HCL 20 MG PO TABS
20.0000 mg | ORAL_TABLET | Freq: Three times a day (TID) | ORAL | Status: DC
  Administered 2020-02-15 – 2020-03-05 (×71): 20 mg via ORAL
  Filled 2020-02-15 (×83): qty 1

## 2020-02-15 MED ORDER — MIDODRINE HCL 5 MG PO TABS
5.0000 mg | ORAL_TABLET | Freq: Two times a day (BID) | ORAL | Status: DC | PRN
  Filled 2020-02-15: qty 1

## 2020-02-15 MED ORDER — FLUCONAZOLE 100 MG PO TABS
400.0000 mg | ORAL_TABLET | Freq: Every day | ORAL | Status: DC
  Administered 2020-02-15 – 2020-02-19 (×4): 400 mg via ORAL
  Filled 2020-02-15 (×5): qty 4

## 2020-02-15 MED ORDER — DULOXETINE HCL 20 MG PO CPEP
20.0000 mg | ORAL_CAPSULE | Freq: Every day | ORAL | Status: DC
  Administered 2020-02-15 – 2020-02-25 (×10): 20 mg via ORAL
  Filled 2020-02-15 (×11): qty 1

## 2020-02-15 MED ORDER — FERROUS SULFATE 325 (65 FE) MG PO TABS
325.0000 mg | ORAL_TABLET | Freq: Every day | ORAL | Status: DC
  Administered 2020-02-15 – 2020-03-05 (×19): 325 mg via ORAL
  Filled 2020-02-15 (×19): qty 1

## 2020-02-15 MED ORDER — SODIUM CHLORIDE 0.9 % IV SOLN: INTRAVENOUS | Status: DC

## 2020-02-15 MED ORDER — FOLIC ACID 1 MG PO TABS
1.0000 mg | ORAL_TABLET | Freq: Every day | ORAL | Status: DC
  Administered 2020-02-15 – 2020-02-27 (×12): 1 mg via ORAL
  Filled 2020-02-15 (×12): qty 1

## 2020-02-15 MED ORDER — SENNA 8.6 MG PO TABS
1.0000 | ORAL_TABLET | Freq: Two times a day (BID) | ORAL | Status: DC
  Administered 2020-02-15 – 2020-03-05 (×37): 8.6 mg via ORAL
  Filled 2020-02-15 (×38): qty 1

## 2020-02-15 MED ORDER — VANCOMYCIN HCL 500 MG/100ML IV SOLN
500.0000 mg | Freq: Once | INTRAVENOUS | Status: AC
  Administered 2020-02-15: 500 mg via INTRAVENOUS
  Filled 2020-02-15: qty 100

## 2020-02-15 MED ORDER — ZOLPIDEM TARTRATE 5 MG PO TABS
5.0000 mg | ORAL_TABLET | Freq: Every evening | ORAL | Status: DC | PRN
  Administered 2020-02-21 – 2020-03-04 (×9): 5 mg via ORAL
  Filled 2020-02-15 (×10): qty 1

## 2020-02-15 MED ORDER — MAGNESIUM HYDROXIDE 400 MG/5ML PO SUSP: 30.0000 mL | Freq: Every day | ORAL | Status: DC | PRN

## 2020-02-15 MED ORDER — SUCRALFATE 1 G PO TABS
1.0000 g | ORAL_TABLET | Freq: Three times a day (TID) | ORAL | Status: DC
  Administered 2020-02-15 – 2020-03-05 (×70): 1 g via ORAL
  Filled 2020-02-15 (×71): qty 1

## 2020-02-15 MED ORDER — VITAMIN B-12 1000 MCG PO TABS
500.0000 ug | ORAL_TABLET | Freq: Every day | ORAL | Status: DC
  Administered 2020-02-15 – 2020-03-05 (×19): 500 ug via ORAL
  Filled 2020-02-15 (×12): qty 1
  Filled 2020-02-15: qty 0.5
  Filled 2020-02-15 (×5): qty 1

## 2020-02-15 MED ORDER — VANCOMYCIN HCL IN DEXTROSE 1-5 GM/200ML-% IV SOLN: 1000.0000 mg | Freq: Once | INTRAVENOUS | Status: DC

## 2020-02-15 MED ORDER — POTASSIUM CHLORIDE CRYS ER 20 MEQ PO TBCR
40.0000 meq | EXTENDED_RELEASE_TABLET | Freq: Once | ORAL | Status: AC
  Administered 2020-02-15: 40 meq via ORAL
  Filled 2020-02-15: qty 2

## 2020-02-15 MED ORDER — TRAZODONE HCL 50 MG PO TABS
25.0000 mg | ORAL_TABLET | Freq: Every evening | ORAL | Status: DC | PRN
  Administered 2020-02-15: 25 mg via ORAL
  Filled 2020-02-15 (×2): qty 1

## 2020-02-15 MED ORDER — CHLORHEXIDINE GLUCONATE CLOTH 2 % EX PADS: 6.0000 | MEDICATED_PAD | Freq: Once | CUTANEOUS | Status: DC

## 2020-02-15 MED ORDER — PIPERACILLIN-TAZOBACTAM 3.375 G IVPB 30 MIN: 3.3750 g | Freq: Once | INTRAVENOUS | Status: DC

## 2020-02-15 MED ORDER — LACTULOSE 10 GM/15ML PO SOLN
30.0000 g | Freq: Three times a day (TID) | ORAL | Status: DC
  Administered 2020-02-15 – 2020-03-04 (×20): 30 g via ORAL
  Filled 2020-02-15 (×41): qty 60

## 2020-02-15 MED ORDER — POTASSIUM CHLORIDE CRYS ER 20 MEQ PO TBCR: 20.0000 meq | EXTENDED_RELEASE_TABLET | Freq: Every day | ORAL | Status: DC

## 2020-02-15 MED ORDER — HYDROMORPHONE HCL 1 MG/ML IJ SOLN
1.0000 mg | Freq: Once | INTRAMUSCULAR | Status: AC
  Administered 2020-02-15: 1 mg via INTRAVENOUS
  Filled 2020-02-15: qty 1

## 2020-02-15 MED ORDER — FUROSEMIDE 20 MG PO TABS
20.0000 mg | ORAL_TABLET | Freq: Every day | ORAL | Status: DC
  Administered 2020-02-15 – 2020-03-05 (×17): 20 mg via ORAL
  Filled 2020-02-15 (×19): qty 1

## 2020-02-15 MED ORDER — MAGNESIUM OXIDE 400 (241.3 MG) MG PO TABS
400.0000 mg | ORAL_TABLET | Freq: Every day | ORAL | Status: DC
  Administered 2020-02-15 – 2020-03-05 (×19): 400 mg via ORAL
  Filled 2020-02-15 (×19): qty 1

## 2020-02-15 MED ORDER — CEFAZOLIN SODIUM-DEXTROSE 2-4 GM/100ML-% IV SOLN
2.0000 g | INTRAVENOUS | Status: DC
  Filled 2020-02-15: qty 100

## 2020-02-15 MED ORDER — OXYCODONE HCL ER 10 MG PO T12A
10.0000 mg | EXTENDED_RELEASE_TABLET | Freq: Two times a day (BID) | ORAL | Status: DC
  Administered 2020-02-15 – 2020-02-19 (×9): 10 mg via ORAL
  Filled 2020-02-15 (×9): qty 1

## 2020-02-15 MED ORDER — PIPERACILLIN-TAZOBACTAM 3.375 G IVPB
3.3750 g | Freq: Three times a day (TID) | INTRAVENOUS | Status: DC
  Administered 2020-02-15 – 2020-02-19 (×12): 3.375 g via INTRAVENOUS
  Filled 2020-02-15 (×11): qty 50

## 2020-02-15 MED ORDER — GABAPENTIN 300 MG PO CAPS
300.0000 mg | ORAL_CAPSULE | Freq: Three times a day (TID) | ORAL | Status: DC | PRN
  Administered 2020-02-15 – 2020-02-19 (×7): 300 mg via ORAL
  Filled 2020-02-15 (×7): qty 1

## 2020-02-15 MED ORDER — ONDANSETRON HCL 4 MG/2ML IJ SOLN
4.0000 mg | Freq: Four times a day (QID) | INTRAMUSCULAR | Status: DC | PRN
  Administered 2020-02-16: 4 mg via INTRAVENOUS

## 2020-02-15 MED ORDER — OLANZAPINE 5 MG PO TABS
10.0000 mg | ORAL_TABLET | Freq: Every day | ORAL | Status: DC
  Administered 2020-02-15 – 2020-03-04 (×20): 10 mg via ORAL
  Filled 2020-02-15 (×16): qty 2
  Filled 2020-02-15: qty 1
  Filled 2020-02-15 (×3): qty 2

## 2020-02-15 MED ORDER — SPIRONOLACTONE 25 MG PO TABS
25.0000 mg | ORAL_TABLET | Freq: Every day | ORAL | Status: DC
  Administered 2020-02-15 – 2020-03-05 (×19): 25 mg via ORAL
  Filled 2020-02-15 (×19): qty 1

## 2020-02-15 MED ORDER — METOPROLOL SUCCINATE ER 25 MG PO TB24
12.5000 mg | ORAL_TABLET | Freq: Every day | ORAL | Status: DC
  Administered 2020-02-15 – 2020-02-25 (×11): 12.5 mg via ORAL
  Filled 2020-02-15 (×9): qty 1
  Filled 2020-02-15: qty 0.5
  Filled 2020-02-15: qty 1

## 2020-02-15 MED ORDER — THIAMINE HCL 100 MG PO TABS
100.0000 mg | ORAL_TABLET | Freq: Every day | ORAL | Status: DC
  Administered 2020-02-15 – 2020-02-25 (×10): 100 mg via ORAL
  Filled 2020-02-15 (×10): qty 1

## 2020-02-15 MED ORDER — ADULT MULTIVITAMIN W/MINERALS CH
1.0000 | ORAL_TABLET | Freq: Every day | ORAL | Status: DC
  Administered 2020-02-15 – 2020-03-05 (×19): 1 via ORAL
  Filled 2020-02-15 (×19): qty 1

## 2020-02-15 MED ORDER — OXYCODONE HCL 5 MG PO TABS
5.0000 mg | ORAL_TABLET | Freq: Four times a day (QID) | ORAL | Status: DC | PRN
  Administered 2020-02-15 – 2020-02-17 (×5): 5 mg via ORAL
  Filled 2020-02-15 (×6): qty 1
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
## 2020-02-15 NOTE — ED Notes (Signed)
Pt had large, loose stool in bed, cleaned by this rn and nurse tech, pt tolerated well however, in pain.

## 2020-02-15 NOTE — ED Notes (Signed)
Lab at bedside for blood draw.

## 2020-02-15 NOTE — ED Notes (Signed)
Purewick placed on pt. Brief changed.

## 2020-02-15 NOTE — Progress Notes (Signed)
PROGRESS NOTE    Rhonda Martin  JME:[id number] DOB: 27-Feb-1976 DOA: 02/14/2020 PCP: Volney American, PA-C  Assessment & Plan:   Active Problems:   Osteomyelitis of right lower extremity (HCC)   Right total knee revision dislocation: with associated right lower extremity osteomyelitis and severe nonpurulent knee cellulitis. Continue on IV vanco, zosyn & fluconazole. Previous knee cultures grew stap epi and candidia parapsilosis. Ortho surg consulted   HTN: will continue on home dose of metoprolol, aldactone  Hypokalemia: KCl repleted. Will continue to monitor  GERD: continue on PPI   Peripheral neuropathy: continue on home dose of gabapentin   Depression: severity unknown. 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 (previous admission, PT/OT recs SNF but pt refused)   Consultants:   Ortho surgery    Procedures:    Antimicrobials: vanco, zosyn    Subjective: Pt c/o right leg pain   Objective: Vitals:   02/15/20 0600 02/15/20 0630 02/15/20 0645 02/15/20 1107  BP: 132/77 119/65 119/65 111/90  Pulse: (!) 102 (!) 108 (!) 107 98  Resp:   20   Temp:      TempSrc:      SpO2: 96%  98%   Weight:      Height:        Intake/Output Summary (Last 24 hours) at 02/15/2020 1457 Last data filed at 02/15/2020 0417 Gross per 24 hour  Intake 1300 ml  Output --  Net 1300 ml   Filed Weights   02/14/20 1722  Weight: 60.8 kg    Examination:  General exam: Appears calm but uncomfortable  Respiratory system: clear breath sounds b/l  Cardiovascular system: S1 & S2 +. No  rubs, gallops or clicks.  Gastrointestinal system: Abdomen is nondistended, soft  and nontender. Normal bowel sounds heard. Central nervous system: Alert and oriented. Moves all 4 extremities Psychiatry: Judgement and insight appear normal. Flat mood and affect     Data Reviewed: I have personally reviewed following labs and imaging studies  CBC: Recent Labs  Lab 02/09/20 0328 02/14/20 1757  WBC 6.1 5.7  NEUTROABS  --  3.5  HGB 7.8* 8.7*  HCT 24.0* 27.7*  MCV 83.0 83.9  PLT 122* 229   Basic Metabolic Panel: Recent Labs  Lab 02/09/20 0328 02/14/20 1757 02/15/20 0328  NA 138 138 135  K 4.2 4.0 3.4*  CL 107 105 104  CO2 27 21* 24  GLUCOSE 126* 114* 93  BUN 10 8 7   CREATININE 0.33* 0.38* 0.39*  CALCIUM 8.3* 8.5* 8.1*  MG 1.8  --   --    GFR: Estimated Creatinine Clearance: 77.1 mL/min (A) (by C-G formula based on SCr of 0.39 mg/dL (L)). Liver Function Tests: Recent Labs  Lab 02/14/20 1757  AST 67*  ALT 26  ALKPHOS 109  BILITOT 1.2  PROT 7.2  ALBUMIN 2.8*   No results for input(s): LIPASE, AMYLASE in the last 168 hours. Recent Labs  Lab 02/15/20 0328  AMMONIA 20   Coagulation Profile: Recent Labs  Lab 02/14/20 1757  INR 1.4*   Cardiac Enzymes: Recent Labs  Lab 02/14/20 1757  CKTOTAL 59   BNP (last 3 results) No results for input(s): PROBNP in the last  8760 hours. HbA1C: No results for input(s): HGBA1C in the last 72 hours. CBG: No results for input(s): GLUCAP in the last 168 hours. Lipid Profile: No results for input(s): CHOL, HDL, LDLCALC, TRIG, CHOLHDL, LDLDIRECT in the last 72 hours. Thyroid Function Tests: No results for input(s): TSH, T4TOTAL, FREET4, T3FREE, THYROIDAB in the last 72 hours. Anemia Panel: No results for input(s): VITAMINB12, FOLATE, FERRITIN, TIBC, IRON, RETICCTPCT in the last 72 hours. Sepsis Labs: Recent Labs  Lab 02/14/20 1757 02/15/20 0336  LATICACIDVEN 3.1* 1.6    Recent Results (from the past 240 hour(s))  Culture, blood (Routine x 2)     Status: None (Preliminary result)   Collection  Time: 02/14/20  5:57 PM   Specimen: BLOOD  Result Value Ref Range Status   Specimen Description BLOOD BLOOD LEFT HAND  Final   Special Requests   Final    BOTTLES DRAWN AEROBIC ONLY Blood Culture results may not be optimal due to an inadequate volume of blood received in culture bottles   Culture   Final    NO GROWTH < 24 HOURS Performed at [facility], [address] [place], [place] [address]    Report Status PENDING  Incomplete  Culture, blood (Routine x 2)     Status: None (Preliminary result)   Collection Time: 02/14/20  5:57 PM   Specimen: BLOOD  Result Value Ref Range Status   Specimen Description BLOOD BLOOD RIGHT FOREARM  Final   Special Requests   Final    BOTTLES DRAWN AEROBIC AND ANAEROBIC Blood Culture adequate volume   Culture   Final    NO GROWTH < 24 HOURS Performed at [facility], [address], [place], [place] [address]    Report Status PENDING  Incomplete  SARS Coronavirus 2 by RT PCR (hospital order, performed in [place] hospital lab) Nasopharyngeal Nasopharyngeal Swab     Status: None   Collection Time: 02/14/20 11:58 PM   Specimen: Nasopharyngeal Swab  Result Value Ref Range Status   SARS Coronavirus 2 NEGATIVE NEGATIVE Final    Comment: (NOTE) SARS-CoV-2 target nucleic acids are NOT DETECTED.  The SARS-CoV-2 RNA is generally detectable in upper and lower respiratory specimens during the acute phase of infection. The lowest concentration of SARS-CoV-2 viral copies this assay can detect is 250 copies / mL. A negative result does not preclude SARS-CoV-2 infection and should not be used as the sole basis for treatment or other patient management decisions.  A negative result may occur with improper specimen collection / handling, submission of specimen other than nasopharyngeal swab, presence of viral mutation(s) within the areas targeted by this assay, and inadequate number of viral copies (<250 copies / mL). A negative result  must be combined with clinical observations, patient history, and epidemiological information.  Fact Sheet for Patients:   StrictlyIdeas.no  Fact Sheet for Healthcare Providers: BankingDealers.co.za  This test is not yet approved or  cleared by the [place] FDA and has been authorized for detection and/or diagnosis of SARS-CoV-2 by FDA under an Emergency Use Authorization (EUA).  This EUA will remain in effect (meaning this test can be used) for the duration of the COVID-19 declaration under Section 564(b)(1) of the Act, 21 U.S.C. section 360bbb-3(b)(1), unless the authorization is terminated or revoked sooner.  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 There is severe lateral subluxation of the tibia relative to the distal femur. The comminuted fracture through the tibial metadiaphyseal junction seen previously is again noted, with slight increased displacement. Progressive bony resorption along the distal femur and proximal tibia consistent with osteomyelitis. There is diffuse soft tissue edema surrounding the right knee. Large knee effusion is suspected. Wound VAC device is in place. There is some residual antibiotic beads at the site of previous orthopedic hardware. IMPRESSION: 1. Persistent fracture of the proximal tibia as above. 2. Interval severe lateral subluxation of the tibia relative to the distal femur. 3. Bony resorption along the distal femur proximal tibia consistent with progressive osteomyelitis. Electronically Signed   By: Randa Ngo M.D.   On: 02/14/2020 18:30        Scheduled  Meds: . dicyclomine  20 mg Oral TID AC & HS  . DULoxetine  20 mg Oral Daily  . ferrous sulfate  325 mg Oral Q breakfast  . fluconazole  400 mg Oral Daily  . folic acid  1 mg Oral Daily  . furosemide  20 mg Oral Daily  . lactulose  30 g Oral TID  . magnesium oxide  400 mg Oral Daily  . metoprolol succinate  12.5 mg Oral Daily  . multivitamin with minerals  1 tablet Oral Daily  . OLANZapine  10 mg Oral QHS  . oxyCODONE  10 mg Oral Q12H  . pantoprazole  40 mg Oral Daily  . senna  1 tablet Oral BID  . spironolactone  25 mg Oral Daily  . sucralfate  1 g Oral TID WC & HS  . thiamine  100 mg Oral Daily  . vitamin B-12  500 mcg Oral Daily   Continuous Infusions: . sodium chloride 100 mL/hr at 02/15/20 0246  . piperacillin-tazobactam (ZOSYN)  IV 3.375 g (02/15/20 0534)  . vancomycin       LOS: 1 day    Time spent: 33 mins     Wyvonnia Dusky, MD [facility] Pager [phone]  If 7PM-7AM, please contact night-coverage [email or web address] 02/15/2020, 2:57 PM
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
## 2020-02-15 NOTE — ED Notes (Signed)
Lab called for blood draw.
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
## 2020-02-15 NOTE — Progress Notes (Signed)
PHARMACY -  BRIEF ANTIBIOTIC NOTE   Pharmacy has received consult(s) for vanc/zosyn from a hospitalist provider.  The patient's profile has been reviewed for ht/wt/allergies/indication/available labs.    One time order(s) placed for vanc 1.5g IV load and zosyn 3.375g IV x 1  Will start maintenance regimen once first doses administered.  Thank you,  Tobie Lords, PharmD, BCPS Clinical Pharmacist 02/15/2020  2:35 AM
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
## 2020-02-15 NOTE — Progress Notes (Addendum)
Pharmacy Antibiotic Note  Rhonda Martin is a 44 y.o. female admitted on 02/14/2020 with osteomyelitis of right knee s/p arthroplasty, KXR: IMPRESSION: 1. Persistent fracture of the proximal tibia as above. 2. Interval severe lateral subluxation of the tibia relative to the distal femur. 3. Bony resorption along the distal femur proximal tibia consistent with progressive osteomyelitis.  Pharmacy has been consulted for vanc/zosyn dosing.  Plan: Patient received vanc 1.5g IV load and zosyn 3.375g IV x 1 in the ED. Will start vanc 1g IV q12h and continue zosyn 3.375g IV q8h and continue to monitor renal function and adjust doses per changes in renal function. Will monitor renal function closely as this combination increases risk of AKI.  Goal trough 15 - 20 mcg/mL Ke [id number] T1/2 ~ 12 hrs  Height: 5\' 2"  (157.5 cm) Weight: 60.8 kg (134 lb) IBW/kg (Calculated) : 50.1  Temp (24hrs), Avg:98.2 F (36.8 C), Min:98.2 F (36.8 C), Max:98.2 F (36.8 C)  Recent Labs  Lab 02/08/20 0337 02/08/20 1529 02/09/20 0328 02/14/20 1757  WBC 5.7  --  6.1 5.7  CREATININE 0.39*  --  0.33* 0.38*  LATICACIDVEN  --   --   --  3.1*  VANCOTROUGH  --  6* <4*  --     Estimated Creatinine Clearance: 77.1 mL/min (A) (by C-G formula based on SCr of 0.38 mg/dL (L)).    Allergies  Allergen Reactions  . Tylenol [Acetaminophen] Other (See Comments)    Liver disease     Thank you for allowing pharmacy to be a part of this patient's care.  Tobie Lords, PharmD, BCPS Clinical Pharmacist 02/15/2020 2:45 AM

## 2020-02-16 ENCOUNTER — Encounter
Admission: EM | Disposition: A | Discharge status: Home / Self Care | Payer: Self-pay | Source: Home / Self Care | Attending: Family Medicine

## 2020-02-16 ENCOUNTER — Encounter: Payer: Self-pay | Admitting: Family Medicine

## 2020-02-16 ENCOUNTER — Inpatient Hospital Stay: Payer: Medicaid Other | Admitting: Anesthesiology

## 2020-02-16 DIAGNOSIS — R7401 Elevation of levels of liver transaminase levels: Secondary | ICD-10-CM

## 2020-02-16 DIAGNOSIS — M869 Osteomyelitis, unspecified: Secondary | ICD-10-CM

## 2020-02-16 DIAGNOSIS — A419 Sepsis, unspecified organism: Secondary | ICD-10-CM

## 2020-02-16 DIAGNOSIS — K7031 Alcoholic cirrhosis of liver with ascites: Secondary | ICD-10-CM

## 2020-02-16 HISTORY — PX: AMPUTATION: SHX166

## 2020-02-16 LAB — COMPREHENSIVE METABOLIC PANEL
ALT: 22 U/L (ref 0–44)
AST: 49 U/L — ABNORMAL HIGH (ref 15–41)
Albumin: 2.6 g/dL — ABNORMAL LOW (ref 3.5–5.0)
Alkaline Phosphatase: 107 U/L (ref 38–126)
Anion gap: 9 (ref 5–15)
BUN: 8 mg/dL (ref 6–20)
CO2: 24 mmol/L (ref 22–32)
Calcium: 8.4 mg/dL — ABNORMAL LOW (ref 8.9–10.3)
Chloride: 105 mmol/L (ref 98–111)
Creatinine, Ser: 0.51 mg/dL (ref 0.44–1.00)
GFR calc Af Amer: >60 mL/min (ref >60)
GFR calc non Af Amer: >60 mL/min (ref >60)
Glucose, Bld: 96 mg/dL (ref 70–99)
Potassium: 3.9 mmol/L (ref 3.5–5.1)
Sodium: 138 mmol/L (ref 135–145)
Total Bilirubin: 1.7 mg/dL — ABNORMAL HIGH (ref 0.3–1.2)
Total Protein: 6.7 g/dL (ref 6.5–8.1)

## 2020-02-16 LAB — CBC
HCT: 26.6 % — ABNORMAL LOW (ref 36.0–46.0)
Hemoglobin: 8.4 g/dL — ABNORMAL LOW (ref 12.0–15.0)
MCH: 26.5 pg (ref 26.0–34.0)
MCHC: 31.6 g/dL (ref 30.0–36.0)
MCV: 83.9 fL (ref 80.0–100.0)
Platelets: 154 10*3/uL (ref 150–400)
RBC: 3.17 MIL/uL — ABNORMAL LOW (ref 3.87–5.11)
RDW: 17.4 % — ABNORMAL HIGH (ref 11.5–15.5)
WBC: 3.4 10*3/uL — ABNORMAL LOW (ref 4.0–10.5)
nRBC: 0 % (ref 0.0–0.2)

## 2020-02-16 LAB — PREPARE RBC (CROSSMATCH)

## 2020-02-16 LAB — PROTIME-INR
INR: 1.4 — ABNORMAL HIGH (ref 0.8–1.2)
Prothrombin Time: 16.9 seconds — ABNORMAL HIGH (ref 11.4–15.2)

## 2020-02-16 LAB — SURGICAL PCR SCREEN
MRSA, PCR: NEGATIVE
Staphylococcus aureus: NEGATIVE

## 2020-02-16 LAB — AMMONIA: Ammonia: 33 umol/L (ref 9–35)

## 2020-02-16 LAB — APTT: aPTT: 40 seconds — ABNORMAL HIGH (ref 24–36)

## 2020-02-16 SURGERY — AMPUTATION, ABOVE KNEE
Anesthesia: General | Site: Knee | Laterality: Right

## 2020-02-16 MED ORDER — LIDOCAINE HCL (PF) 2 % IJ SOLN
INTRAMUSCULAR | Status: AC
  Filled 2020-02-16: qty 5

## 2020-02-16 MED ORDER — OXYCODONE HCL 5 MG PO TABS
ORAL_TABLET | ORAL | Status: AC
Start: 2020-02-16 — End: 2020-02-16
  Filled 2020-02-16: qty 1

## 2020-02-16 MED ORDER — ACETAMINOPHEN 10 MG/ML IV SOLN
INTRAVENOUS | Status: AC
  Filled 2020-02-16: qty 100

## 2020-02-16 MED ORDER — FENTANYL CITRATE (PF) 100 MCG/2ML IJ SOLN
INTRAMUSCULAR | Status: AC
  Filled 2020-02-16: qty 2

## 2020-02-16 MED ORDER — VANCOMYCIN HCL IN DEXTROSE 1-5 GM/200ML-% IV SOLN
INTRAVENOUS | Status: AC
  Filled 2020-02-16: qty 200

## 2020-02-16 MED ORDER — BUPIVACAINE-EPINEPHRINE (PF) 0.5% -1:200000 IJ SOLN
INTRAMUSCULAR | Status: DC | PRN
  Administered 2020-02-16: 30 mL via PERINEURAL

## 2020-02-16 MED ORDER — PROPOFOL 10 MG/ML IV BOLUS
INTRAVENOUS | Status: AC
  Filled 2020-02-16: qty 20

## 2020-02-16 MED ORDER — HYDROMORPHONE HCL 1 MG/ML IJ SOLN
0.5000 mg | INTRAMUSCULAR | Status: AC | PRN
  Administered 2020-02-16 (×2): 0.5 mg via INTRAVENOUS

## 2020-02-16 MED ORDER — LIDOCAINE HCL (CARDIAC) PF 100 MG/5ML IV SOSY
PREFILLED_SYRINGE | INTRAVENOUS | Status: DC | PRN
  Administered 2020-02-16: 80 mg via INTRAVENOUS

## 2020-02-16 MED ORDER — ACETAMINOPHEN 10 MG/ML IV SOLN
INTRAVENOUS | Status: DC | PRN
  Administered 2020-02-16: 1000 mg via INTRAVENOUS

## 2020-02-16 MED ORDER — HYDROMORPHONE HCL 1 MG/ML IJ SOLN
INTRAMUSCULAR | Status: AC
Start: 2020-02-16 — End: 2020-02-16
  Administered 2020-02-16: 0.5 mg via INTRAVENOUS
  Filled 2020-02-16: qty 1

## 2020-02-16 MED ORDER — BUPIVACAINE LIPOSOME 1.3 % IJ SUSP
INTRAMUSCULAR | Status: DC | PRN
  Administered 2020-02-16: 20 mL

## 2020-02-16 MED ORDER — FENTANYL CITRATE (PF) 100 MCG/2ML IJ SOLN
INTRAMUSCULAR | Status: AC
  Administered 2020-02-16: 25 ug via INTRAVENOUS
  Filled 2020-02-16: qty 2

## 2020-02-16 MED ORDER — MIDAZOLAM HCL 2 MG/2ML IJ SOLN
INTRAMUSCULAR | Status: AC
  Filled 2020-02-16: qty 2

## 2020-02-16 MED ORDER — BUPIVACAINE-EPINEPHRINE (PF) 0.5% -1:200000 IJ SOLN
INTRAMUSCULAR | Status: AC
  Filled 2020-02-16: qty 30

## 2020-02-16 MED ORDER — BUPIVACAINE LIPOSOME 1.3 % IJ SUSP
INTRAMUSCULAR | Status: AC
  Filled 2020-02-16: qty 20

## 2020-02-16 MED ORDER — FENTANYL CITRATE (PF) 100 MCG/2ML IJ SOLN
25.0000 ug | INTRAMUSCULAR | Status: DC | PRN
  Administered 2020-02-16 (×4): 25 ug via INTRAVENOUS

## 2020-02-16 MED ORDER — SUGAMMADEX SODIUM 200 MG/2ML IV SOLN
INTRAVENOUS | Status: DC | PRN
  Administered 2020-02-16: 200 mg via INTRAVENOUS

## 2020-02-16 MED ORDER — PROPOFOL 10 MG/ML IV BOLUS
INTRAVENOUS | Status: DC | PRN
  Administered 2020-02-16: 120 mg via INTRAVENOUS

## 2020-02-16 MED ORDER — ONDANSETRON HCL 4 MG/2ML IJ SOLN: 4.0000 mg | Freq: Once | INTRAMUSCULAR | Status: DC | PRN

## 2020-02-16 MED ORDER — ROCURONIUM BROMIDE 100 MG/10ML IV SOLN
INTRAVENOUS | Status: DC | PRN
  Administered 2020-02-16: 40 mg via INTRAVENOUS

## 2020-02-16 MED ORDER — FENTANYL CITRATE (PF) 100 MCG/2ML IJ SOLN
INTRAMUSCULAR | Status: DC | PRN
Start: 2020-02-16 — End: 2020-02-16
  Administered 2020-02-16 (×4): 50 ug via INTRAVENOUS

## 2020-02-16 MED ORDER — DEXAMETHASONE SODIUM PHOSPHATE 10 MG/ML IJ SOLN
INTRAMUSCULAR | Status: DC | PRN
  Administered 2020-02-16: 10 mg via INTRAVENOUS

## 2020-02-16 MED ORDER — OXYCODONE HCL 5 MG PO TABS
5.0000 mg | ORAL_TABLET | Freq: Once | ORAL | Status: AC
  Administered 2020-02-16: 5 mg via ORAL

## 2020-02-16 MED ORDER — MORPHINE SULFATE (PF) 2 MG/ML IV SOLN
2.0000 mg | INTRAVENOUS | Status: DC | PRN
  Administered 2020-02-16 – 2020-02-17 (×5): 2 mg via INTRAVENOUS
  Filled 2020-02-16 (×5): qty 1

## 2020-02-16 MED ORDER — MIDAZOLAM HCL 2 MG/2ML IJ SOLN
INTRAMUSCULAR | Status: DC | PRN
  Administered 2020-02-16: 2 mg via INTRAVENOUS

## 2020-02-16 SURGICAL SUPPLY — 43 items
"PENCIL ELECTRO HAND CTR " (MISCELLANEOUS) IMPLANT
BLADE SAGITTAL WIDE XTHICK NO (BLADE) ×3 IMPLANT
BNDG COHESIVE 4X5 TAN STRL (GAUZE/BANDAGES/DRESSINGS) ×3 IMPLANT
BNDG COHESIVE 6X5 TAN STRL LF (GAUZE/BANDAGES/DRESSINGS) ×2 IMPLANT
BNDG ELASTIC 6X5.8 VLCR NS LF (GAUZE/BANDAGES/DRESSINGS) ×3 IMPLANT
BNDG GAUZE 4.5X4.1 6PLY STRL (MISCELLANEOUS) ×8 IMPLANT
BRUSH SCRUB EZ  4% CHG (MISCELLANEOUS) ×2
BRUSH SCRUB EZ 4% CHG (MISCELLANEOUS) ×1 IMPLANT
CANISTER SUCT 1200ML W/VALVE (MISCELLANEOUS) ×3 IMPLANT
CHLORAPREP W/TINT 26ML (MISCELLANEOUS) ×3 IMPLANT
COVER WAND RF STERILE (DRAPES) ×3 IMPLANT
DRAPE INCISE IOBAN 66X45 STRL (DRAPES) ×3 IMPLANT
DRAPE INCISE IOBAN 66X60 STRL (DRAPES) ×3 IMPLANT
DRSG GAUZE FLUFF 36X18 (GAUZE/BANDAGES/DRESSINGS) ×2 IMPLANT
ELECT CAUTERY BLADE 6.4 (BLADE) ×3 IMPLANT
ELECT REM PT RETURN 9FT ADLT (ELECTROSURGICAL) ×3
ELECTRODE REM PT RTRN 9FT ADLT (ELECTROSURGICAL) ×1 IMPLANT
GAUZE XEROFORM 1X8 LF (GAUZE/BANDAGES/DRESSINGS) ×6 IMPLANT
GLOVE BIO SURGEON STRL SZ7 (GLOVE) ×6 IMPLANT
GLOVE INDICATOR 7.5 STRL GRN (GLOVE) ×3 IMPLANT
GOWN STRL REUS W/ TWL LRG LVL3 (GOWN DISPOSABLE) ×1 IMPLANT
GOWN STRL REUS W/ TWL XL LVL3 (GOWN DISPOSABLE) ×2 IMPLANT
GOWN STRL REUS W/TWL LRG LVL3 (GOWN DISPOSABLE) ×2
GOWN STRL REUS W/TWL XL LVL3 (GOWN DISPOSABLE) ×4
HANDLE YANKAUER SUCT BULB TIP (MISCELLANEOUS) ×5 IMPLANT
KIT TURNOVER KIT A (KITS) ×3 IMPLANT
LABEL OR SOLS (LABEL) ×3 IMPLANT
NS IRRIG 1000ML POUR BTL (IV SOLUTION) ×3 IMPLANT
PACK EXTREMITY (MISCELLANEOUS) ×3 IMPLANT
PAD ABD DERMACEA PRESS 5X9 (GAUZE/BANDAGES/DRESSINGS) ×6 IMPLANT
PAD PREP 24X41 OB/GYN DISP (PERSONAL CARE ITEMS) ×3 IMPLANT
PENCIL ELECTRO HAND CTR (MISCELLANEOUS) ×3 IMPLANT
SPONGE LAP 18X18 RF (DISPOSABLE) ×10 IMPLANT
STAPLER SKIN PROX 35W (STAPLE) ×3 IMPLANT
STOCKINETTE M/LG 89821 (MISCELLANEOUS) ×3 IMPLANT
SUT SILK 2 0 (SUTURE) ×2
SUT SILK 2 0 SH (SUTURE) ×6 IMPLANT
SUT SILK 2-0 18XBRD TIE 12 (SUTURE) ×1 IMPLANT
SUT VIC AB 0 CT1 36 (SUTURE) ×6 IMPLANT
SUT VIC AB 2-0 CT1 (SUTURE) ×6 IMPLANT
SYR 20ML LL LF (SYRINGE) ×4 IMPLANT
TUBING CONNECTING 10 (TUBING) ×1 IMPLANT
TUBING CONNECTING 10' (TUBING) ×1

## 2020-02-16 NOTE — Interval H&P Note (Signed)
History and Physical Interval Note:  02/16/2020 10:09 AM  Rhonda Martin  has presented today for surgery, with the diagnosis of Severe Septic Knee.  The various methods of treatment have been discussed with the patient and family. After consideration of risks, benefits and other options for treatment, the patient has consented to  Procedure(s): AMPUTATION ABOVE KNEE (Right) as a surgical intervention.  The patient's history has been reviewed, patient examined, no change in status, stable for surgery.  I have reviewed the patient's chart and labs.  Questions were answered to the patient's satisfaction.     Leotis Pain

## 2020-02-16 NOTE — Anesthesia Postprocedure Evaluation (Signed)
Anesthesia Post Note  Patient: Rhonda Martin  Procedure(s) Performed: AMPUTATION ABOVE KNEE (Right Knee)  Patient location during evaluation: PACU Anesthesia Type: General Level of consciousness: awake and alert Pain management: pain level controlled Vital Signs Assessment: post-procedure vital signs reviewed and stable Respiratory status: spontaneous breathing and respiratory function stable Cardiovascular status: stable Anesthetic complications: no   No complications documented.   Last Vitals:  Vitals:   02/16/20 1345 02/16/20 1346  BP:    Pulse: 96 89  Resp: 15 10  Temp:  36.5 C  SpO2: (!) 87% 94%    Last Pain:  Vitals:   02/16/20 1338  TempSrc:   PainSc: Asleep                 [name] K

## 2020-02-16 NOTE — Progress Notes (Signed)
Pharmacy Antibiotic Note  Rhonda Martin is a 44 y.o. female admitted on 02/14/2020 with osteomyelitis of right knee s/p arthroplasty, KXR: IMPRESSION: 1. Persistent fracture of the proximal tibia as above. 2. Interval severe lateral subluxation of the tibia relative to the distal femur. 3. Bony resorption along the distal femur proximal tibia consistent with progressive osteomyelitis.  Pharmacy has been consulted for vanc/zosyn dosing.  Plan: For AKA today 9/10. -Will continue vanc 1g IV q12h.  Goal Vanc trough 15 - 20 mcg/mL - zosyn 3.375g EI IV q8h   - continue to monitor renal function and adjust doses per changes in renal function. Will monitor renal function closely as this combination increases risk of AKI.    Height: 5\' 2"  (157.5 cm) Weight: 60.8 kg (134 lb) IBW/kg (Calculated) : 50.1  Temp (24hrs), Avg:99.2 F (37.3 C), Min:98.6 F (37 C), Max:100.2 F (37.9 C)  Recent Labs  Lab 02/14/20 1757 02/15/20 0328 02/15/20 0336 02/16/20 0620  WBC 5.7  --   --  3.4*  CREATININE 0.38* 0.39*  --  0.51  LATICACIDVEN 3.1*  --  1.6  --     Estimated Creatinine Clearance: 77.1 mL/min (by C-G formula based on SCr of 0.51 mg/dL).    Allergies  Allergen Reactions  . Tylenol [Acetaminophen] Other (See Comments)    Liver disease   Fluconazole (on PTA) 9/9 >>   (planned thru 03/22/20 per previous rx) Zosyn 9/8 (evening)>> Vancomycin 9/9 >>   Thank you for allowing pharmacy to be a part of this patient's care.  Chinita Greenland PharmD Clinical Pharmacist 02/16/2020

## 2020-02-16 NOTE — Anesthesia Procedure Notes (Signed)
Procedure Name: Intubation Date/Time: 02/16/2020 12:08 PM Performed by: Allean Found, CRNA Pre-anesthesia Checklist: Patient identified, Patient being monitored, Timeout performed, Emergency Drugs available and Suction available Patient Re-evaluated:Patient Re-evaluated prior to induction Oxygen Delivery Method: Circle system utilized Preoxygenation: Pre-oxygenation with 100% oxygen Induction Type: IV induction Ventilation: Mask ventilation without difficulty Laryngoscope Size: Mac, 3 and McGraph Grade View: Grade I Tube type: Oral Tube size: 7.0 mm Number of attempts: 1 Airway Equipment and Method: Stylet Placement Confirmation: ETT inserted through vocal cords under direct vision,  positive ETCO2 and breath sounds checked- equal and bilateral Secured at: 21 cm Tube secured with: Tape Dental Injury: Teeth and Oropharynx as per pre-operative assessment

## 2020-02-16 NOTE — Progress Notes (Signed)
PROGRESS NOTE    Rhonda Martin  XBJ:[id number] DOB: 1975-08-26 DOA: 02/14/2020 PCP: Volney American, PA-C  Assessment & Plan:   Active Problems:   Osteomyelitis of right lower extremity (HCC)   Failed right total knee replacement: with associated right lower extremity osteomyelitis and severe nonpurulent knee cellulitis. Continue on IV vanco, zosyn & fluconazole. Previous knee cultures grew stap epi and candidia parapsilosis. W/ wound dehiscence, noncompliance w/ medication and alcohol/drug abuse, ortho surg feels the infection will not resolve so pt will go for R AKA today as per vascular surg   HTN: will continue on home dose of metoprolol, aldactone  Hypokalemia: WNL today. Will continue to monitor  GERD: continue on pantoprazole   Peripheral neuropathy: continue on home dose of neurontin   Depression: severity unknown. Will continue on home dose of duloxetine, olanzapine  Cirrhosis: will continue on home dose of lactulose, spironolactone   Transaminitis: likely secondary to cirrhosis. 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 (previous admission, PT/OT recs SNF but pt refused)   Consultants:   Ortho surgery    Procedures:    Antimicrobials: vanco, zosyn    Subjective: Pt c/o severe right leg pain   Objective: Vitals:   02/15/20 2015 02/15/20 2120 02/15/20 2345 02/16/20 0407  BP: 108/61 122/66 113/61 120/66  Pulse: (!) 101 100 (!) 104 99  Resp: 16 18 16 17   Temp:  100.2 F (37.9 C) 99.5 F (37.5 C) 99.1 F (37.3 C)  TempSrc:  Oral Oral Oral  SpO2: 94% 99% 97% 93%  Weight:      Height:        Intake/Output Summary (Last 24 hours) at 02/16/2020 0719 Last data filed at 02/16/2020 0410 Gross per 24 hour   Intake --  Output 500 ml  Net -500 ml   Filed Weights   02/14/20 1722  Weight: 60.8 kg    Examination:  General exam: Appears uncomfortable  Respiratory system: clear breath sounds b/l. No rales Cardiovascular system: S1 & S2 +. No  rubs, gallops or clicks.  Gastrointestinal system: Abdomen is nondistended, soft and nontender. Hypoactive bowel sounds  Central nervous system: Alert and oriented. Moves all 4 extremities Psychiatry: Judgement and insight appear normal. Flat mood and affect     Data Reviewed: I have personally reviewed following labs and imaging studies  CBC: Recent Labs  Lab 02/14/20 1757 02/16/20 0620  WBC 5.7 3.4*  NEUTROABS 3.5  --   HGB 8.7* 8.4*  HCT 27.7* 26.6*  MCV 83.9 83.9  PLT 172 308   Basic Metabolic Panel: Recent Labs  Lab 02/14/20 1757 02/15/20 0328 02/16/20 0620  NA 138 135 138  K 4.0 3.4* 3.9  CL 105 104 105  CO2 21* 24 24  GLUCOSE 114* 93 96  BUN 8 7 8   CREATININE 0.38* 0.39* 0.51  CALCIUM 8.5* 8.1* 8.4*   GFR: Estimated Creatinine Clearance: 77.1 mL/min (by C-G formula based on SCr of 0.51 mg/dL). Liver Function Tests: Recent Labs  Lab 02/14/20 1757 02/16/20 0620  AST 67* 49*  ALT 26 22  ALKPHOS 109 107  BILITOT 1.2 1.7*  PROT 7.2 6.7  ALBUMIN 2.8* 2.6*   No results for input(s): LIPASE, AMYLASE in the last 168 hours. Recent Labs  Lab 02/15/20 0328 02/16/20 0620  AMMONIA 20 33   Coagulation Profile: Recent Labs  Lab 02/14/20 1757 02/16/20 0620  INR 1.4* 1.4*   Cardiac Enzymes: Recent Labs  Lab 02/14/20 1757  CKTOTAL 59   BNP (last 3 results) No results for input(s): PROBNP in the last 8760 hours. HbA1C: No results for input(s): HGBA1C in the last 72 hours. CBG: No results for input(s): GLUCAP in the last 168 hours. Lipid Profile: No results for input(s): CHOL, HDL, LDLCALC, TRIG, CHOLHDL, LDLDIRECT in the last 72 hours. Thyroid Function Tests: No results for input(s): TSH, T4TOTAL, FREET4,  T3FREE, THYROIDAB in the last 72 hours. Anemia Panel: No results for input(s): VITAMINB12, FOLATE, FERRITIN, TIBC, IRON, RETICCTPCT in the last 72 hours. Sepsis Labs: Recent Labs  Lab 02/14/20 1757 02/15/20 0336  LATICACIDVEN 3.1* 1.6    Recent Results (from the past 240 hour(s))  Culture, blood (Routine x 2)     Status: None (Preliminary result)   Collection Time: 02/14/20  5:57 PM   Specimen: BLOOD  Result Value Ref Range Status   Specimen Description BLOOD BLOOD LEFT HAND  Final   Special Requests   Final    BOTTLES DRAWN AEROBIC ONLY Blood Culture results may not be optimal due to an inadequate volume of blood received in culture bottles   Culture   Final    NO GROWTH < 24 HOURS Performed at [facility], [address], [place], [place] [address]    Report Status PENDING  Incomplete  Culture, blood (Routine x 2)     Status: None (Preliminary result)   Collection Time: 02/14/20  5:57 PM   Specimen: BLOOD  Result Value Ref Range Status   Specimen Description BLOOD BLOOD RIGHT FOREARM  Final   Special Requests   Final    BOTTLES DRAWN AEROBIC AND ANAEROBIC Blood Culture adequate volume   Culture   Final    NO GROWTH < 24 HOURS Performed at [facility], [address] [place], [place] [address]    Report Status PENDING  Incomplete  SARS Coronavirus 2 by RT PCR (hospital order, performed in [employer] hospital lab) Nasopharyngeal Nasopharyngeal Swab     Status: None   Collection Time: 02/14/20 11:58 PM   Specimen: Nasopharyngeal Swab  Result Value Ref Range Status   SARS Coronavirus 2 NEGATIVE NEGATIVE Final    Comment: (NOTE) SARS-CoV-2 target nucleic acids are NOT DETECTED.  The SARS-CoV-2 RNA is generally detectable in upper and lower respiratory specimens during the acute phase of infection. The lowest concentration of SARS-CoV-2 viral copies this assay can detect is 250 copies / mL. A negative result does not preclude SARS-CoV-2  infection and should not be used as the sole basis for treatment or other patient management decisions.  A negative result may occur with improper specimen collection / handling, submission of specimen other than nasopharyngeal swab, presence of viral mutation(s) within the areas targeted by this assay, and inadequate number of viral copies (<250 copies / mL). A negative result must be combined with clinical observations, patient history, and epidemiological information.  Fact Sheet for Patients:   StrictlyIdeas.no  Fact Sheet for Healthcare Providers: BankingDealers.co.za  This test is not yet approved or  cleared by the [place] FDA and has been authorized for detection and/or diagnosis of SARS-CoV-2 by FDA under an Emergency Use Authorization (EUA).  This EUA will remain in effect (meaning this test can be used) for the duration of the COVID-19 declaration under  Section 564(b)(1) of the Act, 21 U.S.C. section 360bbb-3(b)(1), unless the authorization is terminated or revoked sooner.  Performed at [facility], [address] [place], [place] [address]   Surgical PCR screen     Status: None   Collection Time: 02/16/20  1:37 AM   Specimen: Nasal Mucosa; Nasal Swab  Result Value Ref Range Status   MRSA, PCR NEGATIVE NEGATIVE Final   Staphylococcus aureus NEGATIVE NEGATIVE Final    Comment: (NOTE) The Xpert SA Assay (FDA approved for NASAL specimens in patients 63 years of age and older), is one component of a comprehensive surveillance program. It is not intended to diagnose infection nor to guide or monitor treatment. Performed at [facility], [address], [place], [place] [address]          Radiology Studies: DG Knee 1-2 Views Right  Result Date: 02/14/2020 CLINICAL DATA:  Infected right knee arthroplasty status post removal, right knee injury EXAM: RIGHT KNEE - 1-2 VIEW COMPARISON:   02/04/2020 FINDINGS: Frontal and cross-table lateral views of the right knee are obtained. There is severe lateral subluxation of the tibia relative to the distal femur. The comminuted fracture through the tibial metadiaphyseal junction seen previously is again noted, with slight increased displacement. Progressive bony resorption along the distal femur and proximal tibia consistent with osteomyelitis. There is diffuse soft tissue edema surrounding the right knee. Large knee effusion is suspected. Wound VAC device is in place. There is some residual antibiotic beads at the site of previous orthopedic hardware. IMPRESSION: 1. Persistent fracture of the proximal tibia as above. 2. Interval severe lateral subluxation of the tibia relative to the distal femur. 3. Bony resorption along the distal femur proximal tibia consistent with progressive osteomyelitis. Electronically Signed   By: Randa Ngo M.D.   On: 02/14/2020 18:30        Scheduled Meds: . Chlorhexidine Gluconate Cloth  6 each Topical Once  . dicyclomine  20 mg Oral TID AC & HS  . DULoxetine  20 mg Oral Daily  . ferrous sulfate  325 mg Oral Q breakfast  . fluconazole  400 mg Oral Daily  . folic acid  1 mg Oral Daily  . furosemide  20 mg Oral Daily  . lactulose  30 g Oral TID  . magnesium oxide  400 mg Oral Daily  . metoprolol succinate  12.5 mg Oral Daily  . multivitamin with minerals  1 tablet Oral Daily  . OLANZapine  10 mg Oral QHS  . oxyCODONE  10 mg Oral Q12H  . pantoprazole  40 mg Oral Daily  . senna  1 tablet Oral BID  . spironolactone  25 mg Oral Daily  . sucralfate  1 g Oral TID WC & HS  . thiamine  100 mg Oral Daily  . vitamin B-12  500 mcg Oral Daily   Continuous Infusions: . sodium chloride 100 mL/hr at 02/15/20 2059  . sodium chloride 75 mL/hr at 02/16/20 0048  .  ceFAZolin (ANCEF) IV    . piperacillin-tazobactam (ZOSYN)  IV 3.375 g (02/16/20 0700)  . vancomycin 1,000 mg (02/16/20 0050)     LOS: 2 days     Time spent: 30 mins     Wyvonnia Dusky, MD [facility] Pager [phone]  If 7PM-7AM, please contact night-coverage [email or web address] 02/16/2020, 7:19 AM

## 2020-02-16 NOTE — Transfer of Care (Signed)
Immediate Anesthesia Transfer of Care Note  Patient: Rhonda Martin  Procedure(s) Performed: AMPUTATION ABOVE KNEE (Right Knee)  Patient Location: PACU  Anesthesia Type:General  Level of Consciousness: awake, alert  and oriented  Airway & Oxygen Therapy: Patient Spontanous Breathing and Patient connected to face mask oxygen  Post-op Assessment: Report given to RN and Post -op Vital signs reviewed and stable  Post vital signs: Reviewed and stable  Last Vitals:  Vitals Value Taken Time  BP    Temp    Pulse 109 02/16/20 1249  Resp 23 02/16/20 1249  SpO2 99 % 02/16/20 1249  Vitals shown include unvalidated device data.  Last Pain:  Vitals:   02/16/20 1248  TempSrc:   PainSc: 10-Worst pain ever         Complications: No complications documented.

## 2020-02-16 NOTE — Op Note (Signed)
[facility]   OPERATIVE NOTE   PROCEDURE:  Right above-the-knee amputation  PRE-OPERATIVE DIAGNOSIS: Right knee replacement with infection and failure  POST-OPERATIVE DIAGNOSIS: same as above  SURGEON:  Leotis Pain, MD  ASSISTANT(S): Hezzie Bump, PA-C  ANESTHESIA: general  ESTIMATED BLOOD LOSS: 400 cc  FINDING(S): none  SPECIMEN(S):  Right above-the-knee amputation  INDICATIONS:   Rhonda Martin is a 44 y.o. female who presents with right infected knee replacement that is not salvageable at this point.  The orthopedic surgery service felt that this was not going to be stable for effusion and to clear the infection and give her stability in the hope of a future prosthesis, and above-knee amputation would be her best option.  The patient is scheduled for a right above-the-knee amputation.  I discussed in depth with the patient the risks, benefits, and alternatives to this procedure.  The patient is aware that the risk of this operation included but are not limited to:  bleeding, infection, myocardial infarction, stroke, death, failure to heal amputation wound, and possible need for more proximal amputation.  The patient is aware of the risks and agrees proceed forward with the procedure. An assistant was present during the procedure to help facilitate the exposure and expedite the procedure.  DESCRIPTION: After full informed written consent was obtained from the patient, the patient was taken to the operating room, and placed supine upon the operating table.  Prior to induction, the patient received IV antibiotics.  The patient was then prepped and draped in the standard fashion for a right above-the-knee amputation. The assistant provided retraction and mobilization to help facilitate exposure and expedite the procedure throughout the entire procedure.  This included following suture, using retractors, and optimizing lighting. After obtaining adequate anesthesia,  the patient was prepped and draped in the standard fashion for a above-the-knee amputation.  I marked out the anterior and posterior flaps for a fish-mouth type of amputation.  I made the incisions for these flaps, and then dissected through the subcutaneous tissue, fascia, and muscles circumferentially.  I elevated  the periosteal tissue 4-5 cm more proximal than the anterior skin flap.  I then transected the femur with a power saw at this level.  Then I smoothed out the rough edges of the bone.  At this point, the specimen was passed off the field as the above-the-knee amputation.  At this point, I clamped all visibly bleeding arteries and veins using a combination of suture ligation with silk suture and electrocautery.   Bleeding continued to be controlled with electrocautery and suture ligature.  The stump was washed off with sterile normal saline and no further active bleeding was noted.  I reapproximated the anterior and posterior fascia  with interrupted stitches of 0 Vicryl.  This was completed along the entire length of anterior and posterior fascia until there were no more loose space in the fascial line. The subcutaneous tissue was then approximated with 2-0 vicryl sutures. The skin was then  reapproximated with staples.  The stump was washed off and dried.  The incision was dressed with Xeroform and ABD pads, and  then fluffs were applied.  Kerlix was wrapped around the leg and then gently an ACE wrap was applied.  A large Ioban was then placed over the ACE wrap to secure the dressing. The patient was then awakened and take to the recovery room in stable condition.   COMPLICATIONS: none  CONDITION: stable  Leotis Pain  02/16/2020, 12:16  PM   This note was created with Dragon Medical transcription system. Any errors in dictation are purely unintentional.

## 2020-02-16 NOTE — Anesthesia Preprocedure Evaluation (Signed)
Anesthesia Evaluation  Patient identified by MRN, date of birth, ID band Patient awake    Reviewed: Allergy & Precautions, NPO status , Patient's Chart, lab work & pertinent test results  History of Anesthesia Complications Negative for: history of anesthetic complications  Airway Mallampati: II  TM Distance: >3 FB     Dental  (+) Poor Dentition, Dental Advidsory Given   Pulmonary COPD,  COPD inhaler, Current Smoker and Patient abstained from smoking.,    breath sounds clear to auscultation- rhonchi (-) wheezing      Cardiovascular hypertension, Pt. on medications (-) CAD, (-) Past MI, (-) Cardiac Stents and (-) CABG  - Systolic murmurs and - Diastolic murmurs    Neuro/Psych neg Seizures PSYCHIATRIC DISORDERS Anxiety Depression negative neurological ROS     GI/Hepatic GERD  ,(+) Cirrhosis       , Hepatitis -  Endo/Other  negative endocrine ROSneg diabetes  Renal/GU negative Renal ROS     Musculoskeletal negative musculoskeletal ROS (+)   Abdominal (+) - obese,   Peds  Hematology  (+) Blood dyscrasia, anemia ,   Anesthesia Other Findings    Reproductive/Obstetrics negative OB ROS                             Anesthesia Physical  Anesthesia Plan  ASA: III  Anesthesia Plan: General   Post-op Pain Management:    Induction: Intravenous  PONV Risk Score and Plan: 1 and Dexamethasone, Ondansetron and Treatment may vary due to age or medical condition  Airway Management Planned: Oral ETT  Additional Equipment:   Intra-op Plan:   Post-operative Plan: Extubation in OR  Informed Consent: I have reviewed the patients History and Physical, chart, labs and discussed the procedure including the risks, benefits and alternatives for the proposed anesthesia with the patient or authorized representative who has indicated his/her understanding and acceptance.       Plan Discussed with:    Anesthesia Plan Comments:         Anesthesia Quick Evaluation

## 2020-02-17 ENCOUNTER — Encounter: Payer: Self-pay | Admitting: Vascular Surgery

## 2020-02-17 LAB — COMPREHENSIVE METABOLIC PANEL
ALT: 21 U/L (ref 0–44)
AST: 41 U/L (ref 15–41)
Albumin: 2.5 g/dL — ABNORMAL LOW (ref 3.5–5.0)
Alkaline Phosphatase: 99 U/L (ref 38–126)
Anion gap: 7 (ref 5–15)
BUN: 8 mg/dL (ref 6–20)
CO2: 25 mmol/L (ref 22–32)
Calcium: 8.1 mg/dL — ABNORMAL LOW (ref 8.9–10.3)
Chloride: 107 mmol/L (ref 98–111)
Creatinine, Ser: 0.37 mg/dL — ABNORMAL LOW (ref 0.44–1.00)
GFR calc Af Amer: >60 mL/min (ref >60)
GFR calc non Af Amer: >60 mL/min (ref >60)
Glucose, Bld: 210 mg/dL — ABNORMAL HIGH (ref 70–99)
Potassium: 4.4 mmol/L (ref 3.5–5.1)
Sodium: 139 mmol/L (ref 135–145)
Total Bilirubin: 1.3 mg/dL — ABNORMAL HIGH (ref 0.3–1.2)
Total Protein: 6.5 g/dL (ref 6.5–8.1)

## 2020-02-17 LAB — CBC
HCT: 28.6 % — ABNORMAL LOW (ref 36.0–46.0)
Hemoglobin: 9.8 g/dL — ABNORMAL LOW (ref 12.0–15.0)
MCH: 28.4 pg (ref 26.0–34.0)
MCHC: 34.3 g/dL (ref 30.0–36.0)
MCV: 82.9 fL (ref 80.0–100.0)
Platelets: 158 10*3/uL (ref 150–400)
RBC: 3.45 MIL/uL — ABNORMAL LOW (ref 3.87–5.11)
RDW: 16.6 % — ABNORMAL HIGH (ref 11.5–15.5)
WBC: 9.9 10*3/uL (ref 4.0–10.5)
nRBC: 0 % (ref 0.0–0.2)

## 2020-02-17 LAB — AMMONIA: Ammonia: 18 umol/L (ref 9–35)

## 2020-02-17 MED ORDER — OXYCODONE HCL 5 MG PO TABS
10.0000 mg | ORAL_TABLET | Freq: Four times a day (QID) | ORAL | Status: DC | PRN
  Administered 2020-02-17 – 2020-02-23 (×17): 10 mg via ORAL
  Filled 2020-02-17 (×19): qty 2

## 2020-02-17 MED ORDER — SODIUM CHLORIDE 0.9 % IV SOLN
INTRAVENOUS | Status: DC | PRN
  Administered 2020-02-17 – 2020-02-21 (×2): 1000 mL via INTRAVENOUS

## 2020-02-17 MED ORDER — HYDROMORPHONE HCL 1 MG/ML IJ SOLN
1.0000 mg | INTRAMUSCULAR | Status: DC | PRN
  Administered 2020-02-17 – 2020-02-18 (×4): 1 mg via INTRAVENOUS
  Filled 2020-02-17 (×4): qty 1

## 2020-02-17 NOTE — Evaluation (Signed)
Occupational Therapy Evaluation Patient Details Name: Rhonda Martin MRN: [id number] DOB: Nov 08, 1975 Today's Date: 02/17/2020    History of Present Illness Rhonda Martin is a 44 y.o. Caucasian female with a known history of Charcot- Marie-Tooth disease, alcohol abuse, anxiety/depression, hepatitis C, narcotic/polysubstance abuse, liver cirrhosis, who underwent right total knee arthroplasty on 06/11/4816 that was complicated by wound dehiscence and bleeding for which she had a right total knee revision on 8/29 by Dr. Rudene Christians at which time she was apparently found to have osteomyelitis, who presented to the emergency room with acute onset of suspected dislocation and worsening swelling, tenderness and pain on her right knee. 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   Clinical Impression   [name] was seen for OT/PT co-evaluation POD#1 from above surgery. Prior to hospital admission, pt was MOD I using AD however endorses extensive falls hx. Pt presents to acute OT demonstrating impaired ADL performance, functional cognition, and functional mobility 2/2 decreased safety awareness, functional strength/ROM/balance deficits, decreased activity tolerance, and pain. Pt currently requires MOD I seated ADLs. MAX A don L sock at bed level. MIN A to exit L side of bed (VCs t/o for safety and encouragement). MOD A + RW for SPT bed>chair. Pt would benefit from skilled OT to address noted impairments and functional limitations (see below for any additional details) in order to maximize safety and independence while minimizing falls risk and caregiver burden. Upon hospital discharge, recommend STR to maximize pt safety and return to PLOF.     Follow Up Recommendations  SNF    Equipment Recommendations   (TBD at next venue)    Recommendations for Other Services       Precautions / Restrictions Precautions Precautions: Fall Restrictions Weight Bearing Restrictions: No      Mobility Bed Mobility Overal bed mobility: Needs  Assistance Bed Mobility: Supine to Sit   Supine to sit: Min assist;HOB elevated (Increased time and VC t/o for motivation/safety)     General bed mobility comments: Increased time due to pain, pt able to roll onto her L with UE support; pt able to sit EOB with VCs   Transfers Overall transfer level: Needs assistance Equipment used: Rolling walker (2 wheeled) Transfers: Sit to/from [facility] Sit to Stand: Mod assist Stand pivot transfers: Mod assist       General transfer comment: +1 standby for safety (pt impulsive)     Balance Overall balance assessment: Needs assistance Sitting-balance support: Single extremity supported;Feet supported Sitting balance-Leahy Scale: Good    Standing balance support: Bilateral upper extremity supported;During functional activity Standing balance-Leahy Scale: Fair          ADL either performed or assessed with clinical judgement   ADL Overall ADL's : Needs assistance/impaired       General ADL Comments: MOD I seated ADLs. MAX A don L sock at bed level. MOD A for ADL t/f.                   Pertinent Vitals/Pain Pain Assessment: 0-10 Pain Score: 8  Pain Location: R LE Pain Descriptors / Indicators: Aching;Burning;Pins and needles;Sharp;Shooting Pain Intervention(s): Limited activity within patient's tolerance;Monitored during session;Premedicated before session;Patient requesting pain meds-RN notified;Repositioned     Hand Dominance Right   Extremity/Trunk Assessment Upper Extremity Assessment Upper Extremity Assessment: Overall WFL for tasks assessed   Lower Extremity Assessment Lower Extremity Assessment: Defer to PT evaluation LLE Deficits / Details: somewhat WFL, somewhat limited ankle DF/PF which pt attributes to Charcot-Marie Tooth  Communication Communication Communication: No difficulties (Tangential speech)   Cognition Arousal/Alertness: Awake/alert Behavior During Therapy:  Anxious;Impulsive Overall Cognitive Status: No family/caregiver present to determine baseline cognitive functioning        General Comments: Poor command following, requires VCs t/o for safety    General Comments       Exercises Exercises: Other exercises Other Exercises Other Exercises: Pt educated re: OT role, DME recs, falls prevention, ECS, pain mgmt, desensitization techniques Other Exercises: LBD, self-drinking, sup>sit, sit<>stand, SPT, sitting/standing balance/tolerance, assist to order meal, therapeutic listening    Shoulder Instructions      Home Living Family/patient expects to be discharged to:: Skilled nursing facility   Available Help at Discharge: Other (Comment) (Unsure where she will be after rehab center and who can help) Type of Home: Other(Comment) Home Access:  (Unsure where she will be after rehab)            Home Equipment: Shower seat;Walker - 2 wheels;Cane - single point;Wheelchair - manual   Additional Comments: Endorses extensive falls hx. Pt uncertain of d/c setting following rehab      Prior Functioning/Environment Level of Independence: Independent with assistive device(s)              OT Problem List: Decreased strength;Decreased coordination;Pain;Decreased range of motion;Decreased activity tolerance;Decreased safety awareness;Impaired balance (sitting and/or standing);Decreased knowledge of use of DME or AE;Decreased knowledge of precautions      OT Treatment/Interventions: Self-care/ADL training;Therapeutic activities;Therapeutic exercise;DME and/or AE instruction;Patient/family education;Balance training;Energy conservation    OT Goals(Current goals can be found in the care plan section) Acute Rehab OT Goals Patient Stated Goal: Get pain under control OT Goal Formulation: With patient Time For Goal Achievement: 03/02/20 Potential to Achieve Goals: Fair  OT Frequency: Min 1X/week   Barriers to D/C: Inaccessible home  environment          Co-evaluation PT/OT/SLP Co-Evaluation/Treatment: Yes Reason for Co-Treatment: Necessary to address cognition/behavior during functional activity;Complexity of the patient's impairments (multi-system involvement) PT goals addressed during session: Mobility/safety with mobility;Strengthening/ROM OT goals addressed during session: ADL's and self-care;Proper use of Adaptive equipment and DME      AM-PAC OT "6 Clicks" Daily Activity     Outcome Measure Help from another person eating meals?: None Help from another person taking care of personal grooming?: A Little Help from another person toileting, which includes using toliet, bedpan, or urinal?: A Lot Help from another person bathing (including washing, rinsing, drying)?: A Lot Help from another person to put on and taking off regular upper body clothing?: A Little Help from another person to put on and taking off regular lower body clothing?: A Lot 6 Click Score: 16   End of Session Equipment Utilized During Treatment: Rolling walker;Gait belt Nurse Communication: Patient requests pain meds  Activity Tolerance: Patient tolerated treatment well (Pt self-limiting) Patient left: in chair;with call bell/phone within reach;with chair alarm set  OT Visit Diagnosis: Other abnormalities of gait and mobility (R26.89);Muscle weakness (generalized) (M62.81);Pain;History of falling (Z91.81) Pain - Right/Left: Right Pain - part of body: Knee;Leg;Ankle and joints of foot                Time: 9622-2979 OT Time Calculation (min): 29 min Charges:  OT General Charges $OT Visit: 1 Visit OT Evaluation $OT Eval Moderate Complexity: 1 Mod OT Treatments $Self Care/Home Management : 8-22 mins  Dessie Coma, M.S. OTR/L  02/17/20, 10:38 AM  ascom [phone]

## 2020-02-17 NOTE — Plan of Care (Signed)
  Problem: Health Behavior/Discharge Planning: Goal: Ability to manage health-related needs will improve Outcome: Progressing   Problem: Clinical Measurements: Goal: Ability to maintain clinical measurements within normal limits will improve Outcome: Progressing Goal: Will remain free from infection Outcome: Progressing Goal: Diagnostic test results will improve Outcome: Progressing Goal: Respiratory complications will improve Outcome: Progressing Goal: Cardiovascular complication will be avoided Outcome: Progressing   Problem: Activity: Goal: Risk for activity intolerance will decrease Outcome: Progressing   Problem: Nutrition: Goal: Adequate nutrition will be maintained Outcome: Progressing   Problem: Coping: Goal: Level of anxiety will decrease Outcome: Progressing   Problem: Elimination: Goal: Will not experience complications related to bowel motility Outcome: Progressing Goal: Will not experience complications related to urinary retention Outcome: Progressing   Problem: Pain Managment: Goal: General experience of comfort will improve Outcome: Progressing   Problem: Safety: Goal: Ability to remain free from injury will improve Outcome: Progressing   Problem: Skin Integrity: Goal: Risk for impaired skin integrity will decrease Outcome: Progressing   Problem: Education: Goal: Ability to verbalize activity precautions or restrictions will improve Outcome: Progressing   Problem: Activity: Goal: Ability to perform//tolerate increased activity and mobilize with assistive devices will improve Outcome: Progressing   Problem: Self-Care: Goal: Ability to meet self-care needs will improve Outcome: Progressing   Problem: Self-Concept: Goal: Ability to maintain and perform role responsibilities to the fullest extent possible will improve Outcome: Progressing   Problem: Pain Management: Goal: Pain level will decrease with appropriate interventions Outcome:  Progressing   Problem: Skin Integrity: Goal: Demonstration of wound healing without infection will improve Outcome: Progressing

## 2020-02-17 NOTE — Evaluation (Signed)
Physical Therapy Evaluation Patient Details Name: Rhonda Martin MRN: [id number] DOB: [birth year] Today's Date: 02/17/2020   History of Present Illness  Pt is a 44 y.o. female with a known history of Charco-Marie-Tooth disease, alcohol abuse, anxiety/depression, hepatitis C, narcotic/polysubstance abuse, and liver cirrhosis. She underwent right total knee arthroplasty on 01/11/2020 and a right total knee revision 5/45 due to complications. She presented to the emergency room with acute onset of suspected dislocation and worsening swelling, tenderness, and pain in her R knee. On 02/16/20 she underwent R AKA surgery.  Clinical Impression  Pt seen for PT evaluation this date status post AKA on 9/10. Patient was sleeping in bed, but is awaken with verbal stimuli. She was hesitant to work with therapy at first due to pain level and not sleeping well, but is agreeable after talking to her. She is A&O x 4. Pt reports 8/10 pain as describes pain as pins and needles, stabbing, aching, and sharp. She experiences phantom limb pain and was educated to continue desensitizing R LE with tactile stimuli. She is able to roll onto her L side with min A and use of BUE support, but unable to lie completely on her L side due to RLE pain. Currently pt is min assist supine to sitting edge of bed and mod A + 1 standby sit to stand. Constant verbal cues needed for technique and UE/LE placement. Throughout session, she is constantly talking about her pain and redirection needed during conversation. At the end of session, patient was in the recliner chair with OT finishing up their evaluation. Pt would benefit from skilled PT to increase strength, balance, and mobility. Upon hospital discharge, pt would benefit from skilled rehabilitation.     Follow Up Recommendations SNF    Equipment Recommendations  None recommended by PT    Recommendations for Other Services       Precautions / Restrictions Precautions Precautions:  Fall Restrictions Weight Bearing Restrictions: No      Mobility  Bed Mobility Overal bed mobility: Needs Assistance Bed Mobility: Rolling;Supine to Sit Rolling: Min assist   Supine to sit: Min assist;HOB elevated (Increased time and VC t/o for motivation/safety)     General bed mobility comments: Increased time due to pain, pt able to roll onto her L with UE support; pt able to sit EOB with VCs   Transfers Overall transfer level: Needs assistance Equipment used: Rolling walker (2 wheeled) Transfers: Sit to/from [facility] Sit to Stand: Mod assist Stand pivot transfers: Mod assist       General transfer comment: +1 standby for safety (pt impulsive)   Ambulation/Gait Ambulation/Gait assistance:  (Did not ambulate due to patient's pain/activity tolerance)              Stairs            Wheelchair Mobility    Modified Rankin (Stroke Patients Only)       Balance Overall balance assessment: Needs assistance Sitting-balance support: Single extremity supported;Feet supported Sitting balance-Leahy Scale: Good Sitting balance - Comments: no LOB in sitting    Standing balance support: Bilateral upper extremity supported;During functional activity Standing balance-Leahy Scale: Fair Standing balance comment: No LOB in standing with BUE support                             Pertinent Vitals/Pain Pain Assessment: 0-10 Pain Score: 8  Pain Location: R LE Pain Descriptors / Indicators: Aching;Burning;Pins and needles;Sharp;Shooting Pain  Intervention(s): Limited activity within patient's tolerance;Monitored during session;Premedicated before session;Patient requesting pain meds-RN notified;Repositioned    Home Living Family/patient expects to be discharged to:: Skilled nursing facility Living Arrangements: Other (Comment) (Unsure where she will be after rehabilitation facility) Available Help at Discharge: Other (Comment) (Unsure where  she will be after rehab center and who can help) Type of Home: House Home Access:  (Unsure where she will be after rehab)       Home Equipment: Shower seat;Walker - 2 wheels;Cane - single point;Wheelchair - manual Additional Comments: Endorses extensive falls hx. Pt uncertain of d/c setting following rehab    Prior Function Level of Independence: Independent with assistive device(s)               Hand Dominance   Dominant Hand: Right    Extremity/Trunk Assessment   Upper Extremity Assessment Upper Extremity Assessment: Overall WFL for tasks assessed    Lower Extremity Assessment Lower Extremity Assessment: Defer to PT evaluation RLE Deficits / Details: Able to perform limited hip movements (abduction/adduction/flexion) with lots of verbal cueing LLE Deficits / Details: somewhat WFL, somewhat limited ankle DF/PF which pt attributes to Charcot-Marie Tooth       Communication   Communication: No difficulties (Tangential speech)  Cognition Arousal/Alertness: Awake/alert Behavior During Therapy: Anxious;Impulsive Overall Cognitive Status: No family/caregiver present to determine baseline cognitive functioning                                 General Comments: Poor command following, requires VCs t/o for safety       General Comments      Exercises Isometrics with R residual limb but limited due to pain   Assessment/Plan    PT Assessment Patient needs continued PT services  PT Problem List Decreased strength;Decreased activity tolerance;Decreased mobility;Decreased balance;Pain;Decreased skin integrity;Decreased range of motion       PT Treatment Interventions DME instruction;Gait training;Stair training;Functional mobility training;Therapeutic activities;Therapeutic exercise;Balance training;Patient/family education;Neuromuscular re-education    PT Goals (Current goals can be found in the Care Plan section)  Acute Rehab PT Goals Patient Stated  Goal: Get pain under control PT Goal Formulation: With patient Time For Goal Achievement: 03/02/20 Potential to Achieve Goals: Fair Additional Goals Additional Goal #1: Pt will perform all bed mobility and transfers with minimal assistance in order to perform ADLs with minimal difficulty.    Frequency 7X/week   Barriers to discharge Decreased caregiver support      Co-evaluation PT/OT/SLP Co-Evaluation/Treatment: Yes Reason for Co-Treatment: Necessary to address cognition/behavior during functional activity;Complexity of the patient's impairments (multi-system involvement) PT goals addressed during session: Mobility/safety with mobility;Strengthening/ROM OT goals addressed during session: ADL's and self-care;Proper use of Adaptive equipment and DME       AM-PAC PT "6 Clicks" Mobility  Outcome Measure Help needed turning from your back to your side while in a flat bed without using bedrails?: A Little Help needed moving from lying on your back to sitting on the side of a flat bed without using bedrails?: A Lot Help needed moving to and from a bed to a chair (including a wheelchair)?: A Lot Help needed standing up from a chair using your arms (e.g., wheelchair or bedside chair)?: A Lot Help needed to walk in hospital room?: A Lot Help needed climbing 3-5 steps with a railing? : Total 6 Click Score: 12    End of Session Equipment Utilized During Treatment: Gait belt Activity Tolerance: Patient  tolerated treatment well;Patient limited by pain Patient left: in chair;with call bell/phone within reach;with chair alarm set (with OT) Nurse Communication: Mobility status PT Visit Diagnosis: Unsteadiness on feet (R26.81);Other abnormalities of gait and mobility (R26.89);Muscle weakness (generalized) (M62.81);Difficulty in walking, not elsewhere classified (R26.2);Pain Pain - Right/Left: Right Pain - part of body: Leg    Time: 4720-7218 PT Time Calculation (min) (ACUTE ONLY): 56  min   Charges:               Noemi Chapel, SPT Bernita Raisin 02/17/2020, 12:42 PM

## 2020-02-17 NOTE — Progress Notes (Signed)
1 Day Post-Op   Subjective/Chief Complaint: Complains of Pain in right stump. Controlled with current regimen. Otherwise without complaint.   Objective: Vital signs in last 24 hours: Temp:  [97.6 F (36.4 C)-99.6 F (37.6 C)] 98.6 F (37 C) (09/11 0902) Pulse Rate:  [81-114] 87 (09/11 0902) Resp:  [8-20] 16 (09/11 0902) BP: (117-162)/(55-97) 142/73 (09/11 0902) SpO2:  [87 %-100 %] 99 % (09/11 0902) Last BM Date: 02/14/20  Intake/Output from previous day: 09/10 0701 - 09/11 0700 In: 3529.4 [I.V.:2506.7; Blood:390; IV Piggyback:632.7] Out: 3050 [Urine:2400; Blood:650] Intake/Output this shift: No intake/output data recorded.  General appearance: alert and no distress Extremities: RIGHT AKA dressing C/D/I, soft  Lab Results:  Recent Labs    02/14/20 1757 02/16/20 0620  WBC 5.7 3.4*  HGB 8.7* 8.4*  HCT 27.7* 26.6*  PLT 172 154   BMET Recent Labs    02/15/20 0328 02/16/20 0620  NA 135 138  K 3.4* 3.9  CL 104 105  CO2 24 24  GLUCOSE 93 96  BUN 7 8  CREATININE 0.39* 0.51  CALCIUM 8.1* 8.4*   PT/INR Recent Labs    02/14/20 1757 02/16/20 0620  LABPROT 16.6* 16.9*  INR 1.4* 1.4*   ABG No results for input(s): PHART, HCO3 in the last 72 hours.  Invalid input(s): PCO2, PO2  Studies/Results: No results found.  Anti-infectives: Anti-infectives (From admission, onward)   Start     Dose/Rate Route Frequency Ordered Stop   02/16/20 1038  vancomycin (VANCOCIN) 1-5 GM/200ML-% IVPB       Note to Pharmacy: Leonia Reader   : cabinet override      02/16/20 1038 02/16/20 2244   02/16/20 0600  ceFAZolin (ANCEF) IVPB 2g/100 mL premix  Status:  Discontinued        2 g 200 mL/hr over 30 Minutes Intravenous On call to O.R. 02/15/20 2156 02/16/20 1411   02/15/20 1300  vancomycin (VANCOCIN) IVPB 1000 mg/200 mL premix        1,000 mg 200 mL/hr over 60 Minutes Intravenous Every 12 hours 02/15/20 0244     02/15/20 1000  fluconazole (DIFLUCAN) tablet 400 mg        400  mg Oral Daily 02/15/20 0103     02/15/20 0600  piperacillin-tazobactam (ZOSYN) IVPB 3.375 g        3.375 g 12.5 mL/hr over 240 Minutes Intravenous Every 8 hours 02/15/20 0111     02/15/20 0115  piperacillin-tazobactam (ZOSYN) IVPB 3.375 g  Status:  Discontinued        3.375 g 100 mL/hr over 30 Minutes Intravenous  Once 02/15/20 0103 02/15/20 0111   02/15/20 0115  vancomycin (VANCOCIN) IVPB 1000 mg/200 mL premix  Status:  Discontinued        1,000 mg 200 mL/hr over 60 Minutes Intravenous  Once 02/15/20 0103 02/15/20 0111   02/15/20 0115  vancomycin (VANCOREADY) IVPB 500 mg/100 mL        500 mg 100 mL/hr over 60 Minutes Intravenous  Once 02/15/20 0111 02/15/20 0417   02/14/20 2330  vancomycin (VANCOCIN) IVPB 1000 mg/200 mL premix        1,000 mg 200 mL/hr over 60 Minutes Intravenous  Once 02/14/20 2328 02/15/20 0235   02/14/20 2330  piperacillin-tazobactam (ZOSYN) IVPB 3.375 g        3.375 g 100 mL/hr over 30 Minutes Intravenous  Once 02/14/20 2328 02/15/20 0051      Assessment/Plan: s/p Procedure(s): AMPUTATION ABOVE KNEE (Right) POD #1  Continue with  pain control OOB as tolerated Will change dressing on Monday  LOS: 3 days    Rhonda Martin A 02/17/2020

## 2020-02-17 NOTE — Progress Notes (Signed)
Consult was placed to the IV Therapist for a new iv site;  Pt with very poor veins; ultrasound used; scar tissue noted; able to place PIV, however pt would benefit from a picc if she will require iv access for any length of time.  She is requesting a picc, and has had them in the past.

## 2020-02-17 NOTE — Progress Notes (Signed)
PROGRESS NOTE    Rhonda Martin  NAT:[id number] DOB: Aug 04, 1975 DOA: 02/14/2020 PCP: Volney American, PA-C  Assessment & Plan:   Active Problems:   Osteomyelitis of right lower extremity (HCC)   Failed right total knee replacement: with associated right lower extremity osteomyelitis and severe nonpurulent knee cellulitis. Continue on IV vanco, zosyn & fluconazole until surgical path is finalized to determine if margins are clean. Previous knee cultures grew stap epi and candidia parapsilosis. W/ wound dehiscence, noncompliance w/ medication and alcohol/drug abuse, ortho surg feels the infection will not resolve so pt will go for R AKA. 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 Continue on IV dialudid, oxycodone, gabapentin.   HTN: will continue on home dose of metoprolol, aldactone  Hypokalemia: within normal limits today. Will continue to monitor  GERD: continue on PPI   Peripheral neuropathy: continue on home dose of gabapentin   Depression: severity unknown. Will continue on home dose of duloxetine, olanzapine  Cirrhosis: will continue on home dose of lactulose, spironolactone   Transaminitis: likely secondary to cirrhosis. Resolved  Charcot-Marie Tooth Disease: continue w/ supportive care    DVT prophylaxis: SCDs Code Status: full  Family Communication: discussed pt's care w/ pt's husband at bedside and answered their questions  Disposition Plan: depends on PT/OT recs. (previous admission, PT/OT recs SNF but pt refused)   Consultants:   Ortho surgery    Procedures:    Antimicrobials: vanco, zosyn    Subjective: Pt c/o R stump severe pain   Objective: Vitals:   02/17/20 0218 02/17/20 0403 02/17/20 0404 02/17/20 0902  BP: (!) 117/55 133/85 133/85 (!) 142/73  Pulse: (!) 105 100 100 87  Resp: 18 17 17 16   Temp: 99.5 F (37.5 C) 98.5 F (36.9 C) 98.5 F (36.9 C) 98.6 F (37 C)  TempSrc: Oral Oral Oral Oral  SpO2: 95%  98% 99%  Weight:      Height:        Intake/Output  Summary (Last 24 hours) at 02/17/2020 1107 Last data filed at 02/17/2020 0513 Gross per 24 hour  Intake 3529.38 ml  Output 3050 ml  Net 479.38 ml   Filed Weights   02/14/20 1722  Weight: 60.8 kg    Examination: General exam: Appears uncomfortable Respiratory system: clear breath sounds b/l. Normal RR. No rales Cardiovascular system: S1 & S2 +. No  rubs, gallops or clicks.  Gastrointestinal system: Abdomen is nondistended, soft and nontender. Hypoactive bowel sounds  Central nervous system: Alert and oriented. Moves all 4 extremities Psychiatry: Judgement and insight appear normal. Normal mood and affect     Data Reviewed: I have personally reviewed following labs and imaging studies  CBC: Recent Labs  Lab 02/14/20 1757 02/16/20 0620 02/17/20 0946  WBC 5.7 3.4* 9.9  NEUTROABS 3.5  --   --   HGB 8.7* 8.4* 9.8*  HCT 27.7* 26.6* 28.6*  MCV 83.9 83.9 82.9  PLT 172 154 427   Basic Metabolic Panel: Recent Labs  Lab 02/14/20 1757 02/15/20 0328 02/16/20 0620 02/17/20 0946  NA 138 135 138 139  K 4.0 3.4* 3.9 4.4  CL 105 104 105 107  CO2 21* 24 24 25   GLUCOSE 114* 93 96 210*  BUN 8 7 8 8   CREATININE 0.38* 0.39* 0.51 0.37*  CALCIUM 8.5* 8.1* 8.4* 8.1*   GFR: Estimated Creatinine Clearance: 77.1 mL/min (A) (by C-G formula based on SCr of 0.37 mg/dL (L)). Liver Function Tests: Recent Labs  Lab 02/14/20 1757 02/16/20 0623 02/17/20 7628  AST 67* 49* 41  ALT 26 22 21   ALKPHOS 109 107 99  BILITOT 1.2 1.7* 1.3*  PROT 7.2 6.7 6.5  ALBUMIN 2.8* 2.6* 2.5*   No results for input(s): LIPASE, AMYLASE in the last 168 hours. Recent Labs  Lab 02/15/20 0328 02/16/20 0620 02/17/20 0946  AMMONIA 20 33 18   Coagulation Profile: Recent Labs  Lab 02/14/20 1757 02/16/20 0620  INR 1.4* 1.4*   Cardiac Enzymes: Recent Labs  Lab 02/14/20 1757  CKTOTAL 59   BNP (last 3 results) No results for input(s): PROBNP in the last 8760 hours. HbA1C: No results for input(s):  HGBA1C in the last 72 hours. CBG: No results for input(s): GLUCAP in the last 168 hours. Lipid Profile: No results for input(s): CHOL, HDL, LDLCALC, TRIG, CHOLHDL, LDLDIRECT in the last 72 hours. Thyroid Function Tests: No results for input(s): TSH, T4TOTAL, FREET4, T3FREE, THYROIDAB in the last 72 hours. Anemia Panel: No results for input(s): VITAMINB12, FOLATE, FERRITIN, TIBC, IRON, RETICCTPCT in the last 72 hours. Sepsis Labs: Recent Labs  Lab 02/14/20 1757 02/15/20 0336  LATICACIDVEN 3.1* 1.6    Recent Results (from the past 240 hour(s))  Culture, blood (Routine x 2)     Status: None (Preliminary result)   Collection Time: 02/14/20  5:57 PM   Specimen: BLOOD  Result Value Ref Range Status   Specimen Description BLOOD BLOOD LEFT HAND  Final   Special Requests   Final    BOTTLES DRAWN AEROBIC ONLY Blood Culture results may not be optimal due to an inadequate volume of blood received in culture bottles   Culture   Final    NO GROWTH 3 DAYS Performed at [facility], [address], [place], [place] [address]    Report Status PENDING  Incomplete  Culture, blood (Routine x 2)     Status: None (Preliminary result)   Collection Time: 02/14/20  5:57 PM   Specimen: BLOOD  Result Value Ref Range Status   Specimen Description BLOOD BLOOD RIGHT FOREARM  Final   Special Requests   Final    BOTTLES DRAWN AEROBIC AND ANAEROBIC Blood Culture adequate volume   Culture   Final    NO GROWTH 3 DAYS Performed at [facility], [address] [place], [place] [address]    Report Status PENDING  Incomplete  SARS Coronavirus 2 by RT PCR (hospital order, performed in [facility] hospital lab) Nasopharyngeal Nasopharyngeal Swab     Status: None   Collection Time: 02/14/20 11:58 PM   Specimen: Nasopharyngeal Swab  Result Value Ref Range Status   SARS Coronavirus 2 NEGATIVE NEGATIVE Final    Comment: (NOTE) SARS-CoV-2 target nucleic acids are NOT DETECTED.  The  SARS-CoV-2 RNA is generally detectable in upper and lower respiratory specimens during the acute phase of infection. The lowest concentration of SARS-CoV-2 viral copies this assay can detect is 250 copies / mL. A negative result does not preclude SARS-CoV-2 infection and should not be used as the sole basis for treatment or other patient management decisions.  A negative result may occur with improper specimen collection / handling, submission of specimen other than nasopharyngeal swab, presence of viral mutation(s) within the areas targeted by this assay, and inadequate number of viral copies (<250 copies / mL). A negative result must be combined with clinical observations, patient history, and epidemiological information.  Fact Sheet for Patients:   StrictlyIdeas.no  Fact Sheet for Healthcare Providers: BankingDealers.co.za  This test is not yet approved or  cleared by the [place] and has been authorized for detection and/or diagnosis of SARS-CoV-2 by FDA under an Emergency Use Authorization (EUA).  This EUA will remain in effect (meaning this test can be used) for the duration of the COVID-19 declaration under Section 564(b)(1) of the Act, 21 U.S.C. section 360bbb-3(b)(1), unless the authorization is terminated or revoked sooner.  Performed at [facility], [address], [place], [place] [address]   Surgical PCR screen     Status: None   Collection Time: 02/16/20  1:37 AM   Specimen: Nasal Mucosa; Nasal Swab  Result Value Ref Range Status   MRSA, PCR NEGATIVE NEGATIVE Final   Staphylococcus aureus NEGATIVE NEGATIVE Final    Comment: (NOTE) The Xpert SA Assay (FDA approved for NASAL specimens in patients 60 years of age and older), is one component of a comprehensive surveillance program. It is not intended to diagnose infection nor to guide or monitor treatment. Performed at [facility], [address] [place], [place] [address]          Radiology Studies: No results found.      Scheduled Meds: . Chlorhexidine Gluconate Cloth  6 each Topical Once  . dicyclomine  20 mg Oral TID AC & HS  . DULoxetine  20 mg Oral Daily  . ferrous sulfate  325 mg Oral Q breakfast  . fluconazole  400 mg Oral Daily  . folic acid  1 mg Oral Daily  . furosemide  20 mg Oral Daily  . lactulose  30 g Oral TID  . magnesium oxide  400 mg Oral Daily  . metoprolol succinate  12.5 mg Oral Daily  . multivitamin with minerals  1 tablet Oral Daily  . OLANZapine  10 mg Oral QHS  . oxyCODONE  10 mg Oral Q12H  . pantoprazole  40 mg Oral Daily  . senna  1 tablet Oral BID  . spironolactone  25 mg Oral Daily  . sucralfate  1 g Oral TID WC & HS  . thiamine  100 mg Oral Daily  . vitamin B-12  500 mcg Oral Daily   Continuous Infusions: . piperacillin-tazobactam (ZOSYN)  IV 3.375 g (02/17/20 0511)  . vancomycin Stopped (02/17/20 1041)     LOS: 3 days    Time spent: 33 mins     Wyvonnia Dusky, MD [facility] Pager [phone]  If 7PM-7AM, please contact night-coverage [email or web address] 02/17/2020, 11:07 AM

## 2020-02-17 NOTE — Progress Notes (Signed)
   02/17/20 0007  Assess: MEWS Score  Temp 99.6 F (37.6 C)  BP 140/71  Pulse Rate (!) 114  Resp 16  SpO2 96 %  O2 Device Room Air  Assess: MEWS Score  MEWS Temp 0  MEWS Systolic 0  MEWS Pulse 2  MEWS RR 0  MEWS LOC 0  MEWS Score 2  MEWS Score Color Yellow  Assess: if the MEWS score is Yellow or Red  Were vital signs taken at a resting state? Yes  Focused Assessment No change from prior assessment  Early Detection of Sepsis Score *See Row Information* Low  MEWS guidelines implemented *See Row Information* Yes  Treat  Pain Scale 0-10  Pain Score 6  Pain Type Surgical pain  Pain Location Leg  Pain Orientation Right  Pain Descriptors / Indicators Aching  Pain Intervention(s) Medication (See eMAR)  Take Vital Signs  Increase Vital Sign Frequency  Yellow: Q 2hr X 2 then Q 4hr X 2, if remains yellow, continue Q 4hrs  Escalate  MEWS: Escalate Yellow: discuss with charge nurse/RN and consider discussing with provider and RRT  Notify: Charge Nurse/RN  Name of Charge Nurse/RN Notified Debra  Date Charge Nurse/RN Notified 02/17/20  Time Charge Nurse/RN Notified 0010  Notify: Provider  Provider Name/Title Sharion Settler, NP  Date Provider Notified 02/17/20  Time Provider Notified 0010  Notification Type  (secured message)  Notification Reason Change in status  Response See new orders  Date of Provider Response 02/17/20

## 2020-02-18 LAB — COMPREHENSIVE METABOLIC PANEL
ALT: 24 U/L (ref 0–44)
AST: 47 U/L — ABNORMAL HIGH (ref 15–41)
Albumin: 2.4 g/dL — ABNORMAL LOW (ref 3.5–5.0)
Alkaline Phosphatase: 93 U/L (ref 38–126)
Anion gap: 8 (ref 5–15)
BUN: 7 mg/dL (ref 6–20)
CO2: 24 mmol/L (ref 22–32)
Calcium: 8 mg/dL — ABNORMAL LOW (ref 8.9–10.3)
Chloride: 104 mmol/L (ref 98–111)
Creatinine, Ser: 0.35 mg/dL — ABNORMAL LOW (ref 0.44–1.00)
GFR calc Af Amer: >60 mL/min (ref >60)
GFR calc non Af Amer: >60 mL/min (ref >60)
Glucose, Bld: 118 mg/dL — ABNORMAL HIGH (ref 70–99)
Potassium: 3.8 mmol/L (ref 3.5–5.1)
Sodium: 136 mmol/L (ref 135–145)
Total Bilirubin: 1 mg/dL (ref 0.3–1.2)
Total Protein: 6.3 g/dL — ABNORMAL LOW (ref 6.5–8.1)

## 2020-02-18 LAB — AMMONIA: Ammonia: 46 umol/L — ABNORMAL HIGH (ref 9–35)

## 2020-02-18 LAB — TYPE AND SCREEN
ABO/RH(D): A POS
Antibody Screen: NEGATIVE
Unit division: 0
Unit division: 0

## 2020-02-18 LAB — CBC
HCT: 29.1 % — ABNORMAL LOW (ref 36.0–46.0)
Hemoglobin: 9.4 g/dL — ABNORMAL LOW (ref 12.0–15.0)
MCH: 27.6 pg (ref 26.0–34.0)
MCHC: 32.3 g/dL (ref 30.0–36.0)
MCV: 85.3 fL (ref 80.0–100.0)
Platelets: 143 10*3/uL — ABNORMAL LOW (ref 150–400)
RBC: 3.41 MIL/uL — ABNORMAL LOW (ref 3.87–5.11)
RDW: 16.9 % — ABNORMAL HIGH (ref 11.5–15.5)
WBC: 14.2 10*3/uL — ABNORMAL HIGH (ref 4.0–10.5)
nRBC: 0 % (ref 0.0–0.2)

## 2020-02-18 LAB — BPAM RBC
Blood Product Expiration Date: 202110032359
Blood Product Expiration Date: 202110062359
ISSUE DATE / TIME: 202109101146
ISSUE DATE / TIME: 202109101146
Unit Type and Rh: 6200
Unit Type and Rh: 6200

## 2020-02-18 MED ORDER — HYDROMORPHONE HCL 1 MG/ML IJ SOLN: 1.0000 mg | INTRAMUSCULAR | Status: DC | PRN

## 2020-02-18 MED ORDER — LINEZOLID 600 MG/300ML IV SOLN
600.0000 mg | Freq: Two times a day (BID) | INTRAVENOUS | Status: DC
  Administered 2020-02-18 – 2020-02-19 (×2): 600 mg via INTRAVENOUS
  Filled 2020-02-18 (×5): qty 300

## 2020-02-18 MED ORDER — HYDROMORPHONE HCL 1 MG/ML IJ SOLN: 1.5000 mg | INTRAMUSCULAR | Status: DC | PRN

## 2020-02-18 NOTE — Progress Notes (Signed)
PROGRESS NOTE    Rhonda Martin  DPO:[id number] DOB: [birth year] DOA: 02/14/2020 PCP: Volney American, PA-C  Assessment & Plan:   Active Problems:   Osteomyelitis of right lower extremity (HCC)   Failed right total knee replacement: with associated right lower extremity osteomyelitis and severe nonpurulent knee cellulitis. Continue on IV vanco, zosyn & fluconazole until surgical path is finalized to determine if margins are clean. Previous knee cultures grew stap epi and candidia parapsilosis. W/ wound dehiscence, noncompliance w/ medication and alcohol/drug abuse, ortho surg feels the infection will not resolve so pt will go for R AKA. 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 Continue on IV dialudid, oxycodone, gabapentin but still c/o R stump pain   Leukocytosis: likely secondary to above. Continue on IV abxs   HTN: will continue on home dose of BB, diuretic   Hypokalemia: WNL today   GERD: continue on pantoprazole    Peripheral neuropathy: continue on home dose of neurontin   Depression: severity unknown. Will continue on home dose of SSRI, olanzapine  Cirrhosis: will continue on home dose of lactulose, spironolactone   Thrombocytopenia: likely secondary to cirrhosis. Will continue to monitor   Transaminitis: likely secondary to cirrhosis. AST is labile.   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 (previous admission, PT/OT recs SNF but pt refused)   Consultants:   Ortho surgery    Procedures:    Antimicrobials: vanco, zosyn    Subjective: Pt c/o of severe R stump pain  Objective: Vitals:   02/18/20 0101 02/18/20 0125 02/18/20 0431 02/18/20 0825  BP: 114/66 113/65 140/82 (!) 147/97  Pulse: 86 97 94 (!) 103  Resp: 16 16 18 20   Temp: 98.6 F (37 C) 98.2 F (36.8 C) 98.5 F (36.9 C) 98.3 F (36.8 C)  TempSrc: Oral Oral Oral Oral  SpO2:  95% 95% 96% 96%  Weight:      Height:        Intake/Output Summary (Last 24 hours) at 02/18/2020 1052 Last data filed at 02/17/2020 1900 Gross per 24 hour  Intake 447.82 ml  Output 1800 ml  Net -1352.18 ml   Filed Weights   02/14/20 1722  Weight: 60.8 kg    Examination: General exam:Appears uncomfortable  Respiratory system: clear breath sounds b/l. No wheezes, rales  Cardiovascular system:S1 &S2 +. No rubs, gallops or clicks.  Gastrointestinal system:Abdomen is nondistended, soft and nontender. Normal bowel sounds  Central nervous system:Alert and oriented. Moves all 4 extremities Psychiatry:Judgement and insight appear normal. Appears tearful     Data Reviewed: I have personally reviewed following labs and imaging studies  CBC: Recent Labs  Lab 02/14/20 1757 02/16/20 0620 02/17/20 0946 02/18/20 0841  WBC 5.7 3.4* 9.9 14.2*  NEUTROABS 3.5  --   --   --   HGB 8.7* 8.4* 9.8* 9.4*  HCT 27.7* 26.6* 28.6* 29.1*  MCV 83.9 83.9 82.9 85.3  PLT 172 154 158 431*   Basic Metabolic Panel: Recent Labs  Lab 02/14/20 1757 02/15/20 0328 02/16/20 0620 02/17/20 0946 02/18/20 0841  NA 138 135 138 139 136  K 4.0 3.4* 3.9 4.4 3.8  CL 105 104 105 107 104  CO2 21* 24 24 25 24   GLUCOSE 114* 93 96 210* 118*  BUN 8 7 8 8 7   CREATININE 0.38* 0.39* 0.51 0.37* 0.35*  CALCIUM 8.5* 8.1* 8.4* 8.1* 8.0*   GFR:  Estimated Creatinine Clearance: 77.1 mL/min (A) (by C-G formula based on SCr of 0.35 mg/dL (L)). Liver Function Tests: Recent Labs  Lab 02/14/20 1757 02/16/20 0620 02/17/20 0946 02/18/20 0841  AST 67* 49* 41 47*  ALT 26 22 21 24   ALKPHOS 109 107 99 93  BILITOT 1.2 1.7* 1.3* 1.0  PROT 7.2 6.7 6.5 6.3*  ALBUMIN 2.8* 2.6* 2.5* 2.4*   No results for input(s): LIPASE, AMYLASE in the last 168 hours. Recent Labs  Lab 02/15/20 0328 02/16/20 0620 02/17/20 0946 02/18/20 0841  AMMONIA 20 33 18 46*   Coagulation Profile: Recent Labs  Lab 02/14/20 1757  02/16/20 0620  INR 1.4* 1.4*   Cardiac Enzymes: Recent Labs  Lab 02/14/20 1757  CKTOTAL 59   BNP (last 3 results) No results for input(s): PROBNP in the last 8760 hours. HbA1C: No results for input(s): HGBA1C in the last 72 hours. CBG: No results for input(s): GLUCAP in the last 168 hours. Lipid Profile: No results for input(s): CHOL, HDL, LDLCALC, TRIG, CHOLHDL, LDLDIRECT in the last 72 hours. Thyroid Function Tests: No results for input(s): TSH, T4TOTAL, FREET4, T3FREE, THYROIDAB in the last 72 hours. Anemia Panel: No results for input(s): VITAMINB12, FOLATE, FERRITIN, TIBC, IRON, RETICCTPCT in the last 72 hours. Sepsis Labs: Recent Labs  Lab 02/14/20 1757 02/15/20 0336  LATICACIDVEN 3.1* 1.6    Recent Results (from the past 240 hour(s))  Culture, blood (Routine x 2)     Status: None (Preliminary result)   Collection Time: 02/14/20  5:57 PM   Specimen: BLOOD  Result Value Ref Range Status   Specimen Description BLOOD BLOOD LEFT HAND  Final   Special Requests   Final    BOTTLES DRAWN AEROBIC ONLY Blood Culture results may not be optimal due to an inadequate volume of blood received in culture bottles   Culture   Final    NO GROWTH 4 DAYS Performed at [facility], [address], [place], [place] [address]    Report Status PENDING  Incomplete  Culture, blood (Routine x 2)     Status: None (Preliminary result)   Collection Time: 02/14/20  5:57 PM   Specimen: BLOOD  Result Value Ref Range Status   Specimen Description BLOOD BLOOD RIGHT FOREARM  Final   Special Requests   Final    BOTTLES DRAWN AEROBIC AND ANAEROBIC Blood Culture adequate volume   Culture   Final    NO GROWTH 4 DAYS Performed at [facility], [address], [place], [place] [address]    Report Status PENDING  Incomplete  SARS Coronavirus 2 by RT PCR (hospital order, performed in [place] hospital lab) Nasopharyngeal Nasopharyngeal Swab     Status: None   Collection Time:  02/14/20 11:58 PM   Specimen: Nasopharyngeal Swab  Result Value Ref Range Status   SARS Coronavirus 2 NEGATIVE NEGATIVE Final    Comment: (NOTE) SARS-CoV-2 target nucleic acids are NOT DETECTED.  The SARS-CoV-2 RNA is generally detectable in upper and lower respiratory specimens during the acute phase of infection. The lowest concentration of SARS-CoV-2 viral copies this assay can detect is 250 copies / mL. A negative result does not preclude SARS-CoV-2 infection and should not be used as the sole basis for treatment or other patient management decisions.  A negative result may occur with improper specimen collection / handling, submission of specimen other than nasopharyngeal swab, presence of viral mutation(s) within the areas targeted by this assay, and inadequate number of viral copies (<  250 copies / mL). A negative result must be combined with clinical observations, patient history, and epidemiological information.  Fact Sheet for Patients:   StrictlyIdeas.no  Fact Sheet for Healthcare Providers: BankingDealers.co.za  This test is not yet approved or  cleared by the [place] FDA and has been authorized for detection and/or diagnosis of SARS-CoV-2 by FDA under an Emergency Use Authorization (EUA).  This EUA will remain in effect (meaning this test can be used) for the duration of the COVID-19 declaration under Section 564(b)(1) of the Act, 21 U.S.C. section 360bbb-3(b)(1), unless the authorization is terminated or revoked sooner.  Performed at [facility], [address] [place], [place] [address]   Surgical PCR screen     Status: None   Collection Time: 02/16/20  1:37 AM   Specimen: Nasal Mucosa; Nasal Swab  Result Value Ref Range Status   MRSA, PCR NEGATIVE NEGATIVE Final   Staphylococcus aureus NEGATIVE NEGATIVE Final    Comment: (NOTE) The Xpert SA Assay (FDA approved for NASAL specimens in patients  26 years of age and older), is one component of a comprehensive surveillance program. It is not intended to diagnose infection nor to guide or monitor treatment. Performed at [facility], [address] [place], [place] [address]          Radiology Studies: No results found.      Scheduled Meds:  Chlorhexidine Gluconate Cloth  6 each Topical Once   dicyclomine  20 mg Oral TID AC & HS   DULoxetine  20 mg Oral Daily   ferrous sulfate  325 mg Oral Q breakfast   fluconazole  400 mg Oral Daily   folic acid  1 mg Oral Daily   furosemide  20 mg Oral Daily   lactulose  30 g Oral TID   magnesium oxide  400 mg Oral Daily   metoprolol succinate  12.5 mg Oral Daily   multivitamin with minerals  1 tablet Oral Daily   OLANZapine  10 mg Oral QHS   oxyCODONE  10 mg Oral Q12H   pantoprazole  40 mg Oral Daily   senna  1 tablet Oral BID   spironolactone  25 mg Oral Daily   sucralfate  1 g Oral TID WC & HS   thiamine  100 mg Oral Daily   vitamin B-12  500 mcg Oral Daily   Continuous Infusions:  sodium chloride 10 mL/hr at 02/17/20 1500   piperacillin-tazobactam (ZOSYN)  IV 3.375 g (02/18/20 0514)   vancomycin 1,000 mg (02/18/20 0056)     LOS: 4 days    Time spent: 31 mins     Wyvonnia Dusky, MD [facility] Pager [phone]  If 7PM-7AM, please contact night-coverage [email or web address] 02/18/2020, 10:52 AM

## 2020-02-18 NOTE — Progress Notes (Signed)
Physical Therapy Treatment Patient Details Name: Rhonda Martin MRN: [id number] DOB: 1975/11/26 Today's Date: 02/18/2020    History of Present Illness Rhonda Martin is a 44 y.o. Caucasian female with a known history of Charcot- Marie-Tooth disease, alcohol abuse, anxiety/depression, hepatitis C, narcotic/polysubstance abuse, liver cirrhosis, who underwent right total knee arthroplasty on 0/06/7791 that was complicated by wound dehiscence and bleeding for which she had a right total knee revision on 8/29 by Dr. Rudene Christians at which time she was apparently found to have osteomyelitis, who presented to the emergency room with acute onset of suspected dislocation and worsening swelling, tenderness and pain on her right knee. 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    PT Comments    Pt in bed upon arrival to [place] [place] nd required max encouragement to participate with PT this afternoon. Pt reports maximum pain in RLE. Pt initially stating she is unable to do anything secondary to pain levels. Pt deferring upright or OOB attempts today. Pt found to be soiled with urine through gown, blanket, and absorbant pads and agreeable to bed mobility to get cleaned up. Pt performed rolling to L with min A for coming into complete sidelying position. Pt requires increased time and rest breaks with all activities secondary to pain. Pt performed LLE bridging initially to get on and off of bedpan and more reps performed for LLE strengthening. Pt educated on importance of RLE positioning while in bed to avoid contractures for possible future prosthetic usage. Pt also instructed in desensitization techniques of RLE. Pt left with all needs met and phone/call bell within reach.   Follow Up Recommendations  SNF     Equipment Recommendations  None recommended by PT    Recommendations for Other Services       Precautions / Restrictions Precautions Precautions: Fall Restrictions Weight Bearing Restrictions: No    Mobility  Bed Mobility Overal  bed mobility: Needs Assistance Bed Mobility: Rolling Rolling: Min assist         General bed mobility comments: Pt performed rolling side to side for gown and soiled absorbant pad change and required min A to come into full L sidelying position; requires increased time and multiple rest breaks secondary to pain  Transfers                 General transfer comment: pt deferred  Ambulation/Gait                 Stairs             Wheelchair Mobility    Modified Rankin (Stroke Patients Only)       Balance       Sitting balance - Comments: not performed       Standing balance comment: not performed                            Cognition Arousal/Alertness: Awake/alert Behavior During Therapy: Anxious;Impulsive Overall Cognitive Status: Within Functional Limits for tasks assessed                                        Exercises Other Exercises Other Exercises: pt educated on importance of RLE residual limb preparation for prosthesis including avoiding contractures and desensitization techniques Other Exercises: pt performed LLE bridging x 5 and RLE hip extension; verbal cues for corect technique Other Exercises: pt assisted onto bed  pan and able to perform self pericare without assistance    General Comments        Pertinent Vitals/Pain Pain Assessment: Faces Faces Pain Scale: Hurts worst Pain Location: RLE Pain Descriptors / Indicators: Aching;Burning;Pins and needles;Sharp;Shooting Pain Intervention(s): Monitored during session;Limited activity within patient's tolerance;Repositioned    Home Living                      Prior Function            PT Goals (current goals can now be found in the care plan section) Acute Rehab PT Goals Patient Stated Goal: Get pain under control PT Goal Formulation: With patient Time For Goal Achievement: 03/02/20 Potential to Achieve Goals: Fair Progress towards PT  goals: Progressing toward goals    Frequency    7X/week      PT Plan Current plan remains appropriate    Co-evaluation              AM-PAC PT "6 Clicks" Mobility   Outcome Measure  Help needed turning from your back to your side while in a flat bed without using bedrails?: A Little Help needed moving from lying on your back to sitting on the side of a flat bed without using bedrails?: A Lot Help needed moving to and from a bed to a chair (including a wheelchair)?: A Lot Help needed standing up from a chair using your arms (e.g., wheelchair or bedside chair)?: A Lot Help needed to walk in hospital room?: A Lot Help needed climbing 3-5 steps with a railing? : Total 6 Click Score: 12    End of Session   Activity Tolerance: Patient limited by pain Patient left: in bed;with call bell/phone within reach;with bed alarm set Nurse Communication: Mobility status PT Visit Diagnosis: Unsteadiness on feet (R26.81);Other abnormalities of gait and mobility (R26.89);Muscle weakness (generalized) (M62.81);Difficulty in walking, not elsewhere classified (R26.2);Pain Pain - Right/Left: Right Pain - part of body: Leg     Time: 1456-1534 PT Time Calculation (min) (ACUTE ONLY): 38 min  Charges:                         , SPT     02/18/2020, 4:10 PM   

## 2020-02-18 NOTE — Plan of Care (Signed)
  Problem: Health Behavior/Discharge Planning: Goal: Ability to manage health-related needs will improve Outcome: Progressing   Problem: Clinical Measurements: Goal: Ability to maintain clinical measurements within normal limits will improve Outcome: Progressing Goal: Will remain free from infection Outcome: Progressing Goal: Diagnostic test results will improve Outcome: Progressing Goal: Respiratory complications will improve Outcome: Progressing Goal: Cardiovascular complication will be avoided Outcome: Progressing   Problem: Activity: Goal: Risk for activity intolerance will decrease Outcome: Progressing   Problem: Nutrition: Goal: Adequate nutrition will be maintained Outcome: Progressing   Problem: Coping: Goal: Level of anxiety will decrease Outcome: Progressing   Problem: Elimination: Goal: Will not experience complications related to bowel motility Outcome: Progressing Goal: Will not experience complications related to urinary retention Outcome: Progressing   Problem: Pain Managment: Goal: General experience of comfort will improve Outcome: Progressing   Problem: Safety: Goal: Ability to remain free from injury will improve Outcome: Progressing   Problem: Skin Integrity: Goal: Risk for impaired skin integrity will decrease Outcome: Progressing   Problem: Education: Goal: Ability to verbalize activity precautions or restrictions will improve Outcome: Progressing   Problem: Activity: Goal: Ability to perform//tolerate increased activity and mobilize with assistive devices will improve Outcome: Progressing   Problem: Self-Care: Goal: Ability to meet self-care needs will improve Outcome: Progressing   Problem: Self-Concept: Goal: Ability to maintain and perform role responsibilities to the fullest extent possible will improve Outcome: Progressing   Problem: Pain Management: Goal: Pain level will decrease with appropriate interventions Outcome:  Progressing   Problem: Skin Integrity: Goal: Demonstration of wound healing without infection will improve Outcome: Progressing

## 2020-02-19 DIAGNOSIS — A419 Sepsis, unspecified organism: Secondary | ICD-10-CM

## 2020-02-19 DIAGNOSIS — M869 Osteomyelitis, unspecified: Secondary | ICD-10-CM

## 2020-02-19 LAB — CBC
HCT: 29.3 % — ABNORMAL LOW (ref 36.0–46.0)
Hemoglobin: 9.8 g/dL — ABNORMAL LOW (ref 12.0–15.0)
MCH: 28.2 pg (ref 26.0–34.0)
MCHC: 33.4 g/dL (ref 30.0–36.0)
MCV: 84.4 fL (ref 80.0–100.0)
Platelets: 159 10*3/uL (ref 150–400)
RBC: 3.47 MIL/uL — ABNORMAL LOW (ref 3.87–5.11)
RDW: 17.3 % — ABNORMAL HIGH (ref 11.5–15.5)
WBC: 8.6 10*3/uL (ref 4.0–10.5)
nRBC: 0 % (ref 0.0–0.2)

## 2020-02-19 LAB — CULTURE, BLOOD (ROUTINE X 2)
Culture: NO GROWTH
Culture: NO GROWTH
Special Requests: ADEQUATE

## 2020-02-19 LAB — COMPREHENSIVE METABOLIC PANEL
ALT: 25 U/L (ref 0–44)
AST: 46 U/L — ABNORMAL HIGH (ref 15–41)
Albumin: 2.4 g/dL — ABNORMAL LOW (ref 3.5–5.0)
Alkaline Phosphatase: 90 U/L (ref 38–126)
Anion gap: 7 (ref 5–15)
BUN: 9 mg/dL (ref 6–20)
CO2: 28 mmol/L (ref 22–32)
Calcium: 7.9 mg/dL — ABNORMAL LOW (ref 8.9–10.3)
Chloride: 103 mmol/L (ref 98–111)
Creatinine, Ser: 0.43 mg/dL — ABNORMAL LOW (ref 0.44–1.00)
GFR calc Af Amer: >60 mL/min (ref >60)
GFR calc non Af Amer: >60 mL/min (ref >60)
Glucose, Bld: 120 mg/dL — ABNORMAL HIGH (ref 70–99)
Potassium: 4.1 mmol/L (ref 3.5–5.1)
Sodium: 138 mmol/L (ref 135–145)
Total Bilirubin: 0.7 mg/dL (ref 0.3–1.2)
Total Protein: 6.2 g/dL — ABNORMAL LOW (ref 6.5–8.1)

## 2020-02-19 LAB — AMMONIA: Ammonia: 34 umol/L (ref 9–35)

## 2020-02-19 MED ORDER — OXYCODONE HCL 5 MG PO TABS
5.0000 mg | ORAL_TABLET | Freq: Once | ORAL | Status: AC
  Administered 2020-02-19: 5 mg via ORAL
  Filled 2020-02-19: qty 1

## 2020-02-19 MED ORDER — OXYCODONE HCL ER 10 MG PO T12A
20.0000 mg | EXTENDED_RELEASE_TABLET | Freq: Two times a day (BID) | ORAL | Status: DC
  Administered 2020-02-19 – 2020-02-24 (×10): 20 mg via ORAL
  Filled 2020-02-19 (×10): qty 2

## 2020-02-19 MED ORDER — GABAPENTIN 400 MG PO CAPS
400.0000 mg | ORAL_CAPSULE | Freq: Three times a day (TID) | ORAL | Status: DC | PRN
  Administered 2020-02-19 – 2020-02-22 (×7): 400 mg via ORAL
  Filled 2020-02-19 (×7): qty 1

## 2020-02-19 NOTE — TOC Initial Note (Signed)
Transition of Care [facility]) - Initial/Assessment Note    Patient Details  Name: Rhonda Martin MRN: [id number] Date of Birth: 1976/01/21  Transition of [facility]) CM/SW Contact:    Shelbie Ammons, RN Phone Number: 02/19/2020, 3:56 PM  Clinical Narrative:       RNCM met with patient in room, patient sitting up in recliner alert and verbally responsive. Discussed with patient that the therapists are again recommending SNF, she questions as to if the same things apply as before. Verified that she would need to agree to stay for 1 month and that she would sign her check over to the facility for that month. Patient verbalizes that she does not have any choice this time after the amputation, she knows she needs to go.  Verified PASSR, completed FL-2 and initiated bed search.             Expected Discharge Plan: Skilled Nursing Facility Barriers to Discharge: Continued Medical Work up   Patient Goals and CMS Choice        Expected Discharge Plan and Services Expected Discharge Plan: [place]       Living arrangements for the past 2 months: Single Family Home                                      Prior Living Arrangements/Services Living arrangements for the past 2 months: Single Family Home   Patient language and need for interpreter reviewed:: Yes Do you feel safe going back to the place where you live?: Yes      Need for Family Participation in Patient Care: Yes (Comment) Care giver support system in place?: Yes (comment)   Criminal Activity/Legal Involvement Pertinent to Current Situation/Hospitalization: No - Comment as needed  Activities of Daily Living Home Assistive Devices/Equipment: Wheelchair, Environmental consultant (specify type), Cane (specify quad or straight) ADL Screening (condition at time of admission) Patient's cognitive ability adequate to safely complete daily activities?: Yes Is the patient deaf or have difficulty hearing?: No Does the patient have  difficulty seeing, even when wearing glasses/contacts?: No Does the patient have difficulty concentrating, remembering, or making decisions?: No Patient able to express need for assistance with ADLs?: Yes Does the patient have difficulty dressing or bathing?: Yes Independently performs ADLs?: No Communication: Independent Dressing (OT): Needs assistance Is this a change from baseline?: Pre-admission baseline Grooming: Independent Feeding: Independent Bathing: Needs assistance Is this a change from baseline?: Pre-admission baseline Toileting: Needs assistance Is this a change from baseline?: Pre-admission baseline In/Out Bed: Needs assistance Is this a change from baseline?: Pre-admission baseline Walks in Home: Needs assistance Is this a change from baseline?: Pre-admission baseline Does the patient have difficulty walking or climbing stairs?: Yes Weakness of Legs: Both Weakness of Arms/Hands: None  Permission Sought/Granted                  Emotional Assessment       Orientation: : Oriented to Self, Oriented to Place, Oriented to  Time, Oriented to Situation   Psych Involvement: No (comment)  Admission diagnosis:  Knee subluxation, right, initial encounter [S83.101A] Deformity of right knee joint [M21.961] Osteomyelitis of right lower extremity (HCC) [M86.9] Sepsis, due to unspecified organism, unspecified whether acute organ dysfunction present [facility]) [A41.9] Patient Active Problem List   Diagnosis Date Noted  . Osteomyelitis of right lower extremity ([place]) 02/14/2020  . Anemia   . Wound dehiscence,  surgical, initial encounter 02/04/2020  . Infection of deep incisional surgical site after procedure   . Wound dehiscence   . S/P TKR (total knee replacement) using cement, 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  . Pancytopenia ([place])   . Hypomagnesemia   . GI bleeding 11/24/2019  . AKI (acute kidney injury) ([place])   . GIB (gastrointestinal bleeding)   . Elevated LFTs 10/24/2019  .  Hyperbilirubinemia 10/24/2019  . Hematemesis 10/23/2019  . Acute esophagitis   . 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 Neutropenic fever ([place])   . Lobar pneumonia ([place])   . Hepatic encephalopathy ([place]) 08/23/2019  . Hypotension   . Hallucinations 08/22/2019  . Acute metabolic encephalopathy 90/24/0973  . Acute hepatic encephalopathy 08/22/2019  . Sepsis ([place]) 07/10/2019  . Hyponatremia 07/10/2019  . Hypokalemia 07/10/2019  . Anxiety 07/10/2019  . COPD (chronic obstructive pulmonary disease) ([place]) 07/10/2019  . HTN (hypertension) 07/10/2019  . Liver cirrhosis ([place])   . Symptomatic anemia 06/26/2019  . IDA (iron deficiency anemia) 06/26/2019  . Insomnia 04/03/2019  . Sinus tachycardia 04/03/2019  . Knee pain, right 03/31/2019  . Bilateral leg edema 02/06/2019  . Generalized anxiety disorder 01/23/2019  . 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 Alcohol withdrawal delirium ([place]) 01/20/2019  . Chronic hepatitis C without hepatic coma ([place]) 12/20/2018  . Thrombocytopenia ([place]) 12/20/2018  . Alcohol abuse 12/20/2018  . Transaminitis 12/20/2018  . Tobacco abuse 12/20/2018  . Easy bruising 12/20/2018  . Weakness 12/20/2018  . Folate deficiency 12/20/2018  . Hepatitis B core antibody negative 12/20/2018  . Charcot-Marie-Tooth disease    PCP:  Volney American, PA-C Pharmacy:   [id number] [place] [id number]: [phone]: [phone]    Social Determinants of Health (SDOH) Interventions    Readmission Risk Interventions Readmission Risk Prevention Plan 02/19/2020 11/29/2019 10/25/2019  Post Dischage Appt - - -  Medication Screening - - -  Transportation Screening Complete Complete Complete  PCP or Specialist Appt within 3-5 Days - - Complete  HRI or Home Care Consult - - Complete  Medication Review (RAntoine Complete Complete Complete  PCP or Specialist appointment within 3-5  days of discharge Complete Complete -  [place] Consult Complete - -  SW Recovery Care/Counseling Consult Complete - -  Palliative Care Screening Not Applicable Not Applicable -  [place] Not Applicable -

## 2020-02-19 NOTE — Discharge Instructions (Signed)
Vascular Surgery Discharge Instructions:  1) Daily dressing changes: Kerlix covered with ace bandage

## 2020-02-19 NOTE — Progress Notes (Signed)
Reinforced dressing AKA.  Administered one time dose of 5 mg oxycodone, per PA order.

## 2020-02-19 NOTE — Progress Notes (Signed)
Physical Therapy Treatment Patient Details Name: Rhonda Martin MRN: [id number] DOB: [birth year] Today's Date: 02/19/2020    History of Present Illness Rhonda Martin is a 44 y.o. Caucasian female with a known history of Charcot- Marie-Tooth disease, alcohol abuse, anxiety/depression, hepatitis C, narcotic/polysubstance abuse, liver cirrhosis, who underwent right total knee arthroplasty on 09/06/6604 that was complicated by wound dehiscence and bleeding for which she had a right total knee revision on 8/29 by Dr. Rudene Christians at which time she was apparently found to have osteomyelitis, who presented to the emergency room with acute onset of suspected dislocation and worsening swelling, tenderness and pain on her right knee. 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    PT Comments    Pt lying in bed upon arrival and agreeable to PT session. Pt requires max time to perform all movements and requires multiple rest breaks between movements secondary to increased pain. Pt very tearful and screaming in pain throughout session. Pt's R residual limb noted to be resting in flexed position and pt educated on importance of avoiding RLE elevation secondary to possible development of contractures. Pt performed bed mobility with supervision for safety and requires no physical assistance. Pt requires max verbal cues for sequencing of all tasks. Pt stood to RW with mod A and residual limb bandage fell off immediately. Pt returned to sitting quickly requiring mod A for eccentric control on descent. Pt transferred from bed to chair with mod A for balance and safety and ambulated at most 2 feet using RW. Pt sat in recliner chair and nurse notified of bandage removal. Additional time taken to position pt to redress RLE. Pt continues to be greatly limited secondary to increased pain. STR remains appropriate for discharge due to patient's poor tolerance of activity.   Follow Up Recommendations  SNF     Equipment Recommendations  None recommended by  PT    Recommendations for Other Services       Precautions / Restrictions Precautions Precautions: Fall Restrictions RLE Weight Bearing: Non weight bearing    Mobility  Bed Mobility Overal bed mobility: Needs Assistance       Supine to sit: HOB elevated;Supervision     General bed mobility comments: Pt performed supine to sit with supervision for safety. HOB raised and pt required increased time and max verbal cues for sequencing of task. Pt requires several rest breaks secondary to increased pain.  Transfers Overall transfer level: Needs assistance Equipment used: Rolling walker (2 wheeled) Transfers: Sit to/from Stand Sit to Stand: Mod assist         General transfer comment: pt able to self-initiate sit <> stand transfer to RW and able to clear hips then required mod A for boosting to full stance and balance; pt immediately screamed in pain and sat quickly to bed with mod A for eccentric control on descent  Ambulation/Gait Ambulation/Gait assistance: Min assist Gait Distance (Feet): 2 Feet Assistive device: Rolling walker (2 wheeled)   Gait velocity: decreased   General Gait Details: pt able to power through BUE to clear L foot with min A for steadying and safety   Stairs             Wheelchair Mobility    Modified Rankin (Stroke Patients Only)       Balance Overall balance assessment: Needs assistance Sitting-balance support: Feet supported Sitting balance-Leahy Scale: Good Sitting balance - Comments: BUE supporting RLE at times and pt with good static balance   Standing balance support: Bilateral upper extremity  supported;During functional activity Standing balance-Leahy Scale: Fair Standing balance comment: requires physical assistance to balance with BUE on RW                            Cognition Arousal/Alertness: Awake/alert Behavior During Therapy: Anxious Overall Cognitive Status: Within Functional Limits for tasks  assessed                                        Exercises Other Exercises Other Exercises: pt educated on importance of R hip extension Other Exercises: pt's RLE bandage fell off during intiial sit <> stand transfer; additional time taken to contact the nurse and re-wrap residual limb    General Comments        Pertinent Vitals/Pain Pain Assessment: Faces Faces Pain Scale: Hurts worst Pain Location: RLE Pain Descriptors / Indicators: Aching;Burning;Pins and needles;Sharp;Shooting Pain Intervention(s): Monitored during session;Repositioned;Limited activity within patient's tolerance    Home Living                      Prior Function            PT Goals (current goals can now be found in the care plan section) Acute Rehab PT Goals Patient Stated Goal: Get pain under control PT Goal Formulation: With patient Time For Goal Achievement: 03/02/20 Potential to Achieve Goals: Fair Progress towards PT goals: Progressing toward goals    Frequency    7X/week      PT Plan Current plan remains appropriate    Co-evaluation              AM-PAC PT "6 Clicks" Mobility   Outcome Measure  Help needed turning from your back to your side while in a flat bed without using bedrails?: A Little Help needed moving from lying on your back to sitting on the side of a flat bed without using bedrails?: A Lot Help needed moving to and from a bed to a chair (including a wheelchair)?: A Lot Help needed standing up from a chair using your arms (e.g., wheelchair or bedside chair)?: A Lot Help needed to walk in hospital room?: A Lot Help needed climbing 3-5 steps with a railing? : Total 6 Click Score: 12    End of Session Equipment Utilized During Treatment: Gait belt Activity Tolerance: Patient limited by pain Patient left: in chair;with call bell/phone within reach;with chair alarm set Nurse Communication: Mobility status PT Visit Diagnosis: Unsteadiness on  feet (R26.81);Other abnormalities of gait and mobility (R26.89);Muscle weakness (generalized) (M62.81);Difficulty in walking, not elsewhere classified (R26.2);Pain Pain - Right/Left: Right Pain - part of body: Leg     Time: 1410-1511 PT Time Calculation (min) (ACUTE ONLY): 61 min  Charges:                       Vale Haven, SPT   Vale Haven 02/19/2020, 4:49 PM

## 2020-02-19 NOTE — NC FL2 (Signed)
[place] LEVEL OF CARE SCREENING TOOL     IDENTIFICATION  Patient Name: Rhonda Martin Birthdate: 1976/01/05 Sex: female Admission Date (Current Location): 02/14/2020  [place] and [place] Number:  Engineering geologist and Address:  [facility], [address], [place], [place] [address]      Provider Number: [id number]  Attending Physician Name and Address:  Wyvonnia Dusky, MD  Relative Name and Phone Number:  Gasper Sells [phone]    Current Level of Care: Hospital Recommended Level of Care: [place] Prior Approval Number:    Date Approved/Denied:   PASRR Number: [id number]  Discharge Plan: SNF    Current Diagnoses: Patient Active Problem List   Diagnosis Date Noted  . Osteomyelitis of right lower extremity ([place]) 02/14/2020  . Anemia   . Wound dehiscence, surgical, initial encounter 02/04/2020  . Infection of deep incisional surgical site after procedure   . Wound dehiscence   . S/P TKR (total knee replacement) using cement, 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  . Pancytopenia ([place])   . Hypomagnesemia   . GI bleeding 11/24/2019  . AKI (acute kidney injury) ([place])   . GIB (gastrointestinal bleeding)   . Elevated LFTs 10/24/2019  . Hyperbilirubinemia 10/24/2019  . Hematemesis 10/23/2019  . Acute esophagitis   . 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 Neutropenic fever ([place])   . Lobar pneumonia ([place])   . Hepatic encephalopathy ([place]) 08/23/2019  . Hypotension   . Hallucinations 08/22/2019  . Acute metabolic encephalopathy 12/06/1599  . Acute hepatic encephalopathy 08/22/2019  . Sepsis ([place]) 07/10/2019  . Hyponatremia 07/10/2019  . Hypokalemia 07/10/2019  . Anxiety 07/10/2019  . COPD (chronic obstructive pulmonary disease) ([place]) 07/10/2019  . HTN (hypertension) 07/10/2019  . Liver cirrhosis ([place])   . Symptomatic anemia 06/26/2019  . IDA (iron deficiency anemia) 06/26/2019  . Insomnia  04/03/2019  . Sinus tachycardia 04/03/2019  . Knee pain, right 03/31/2019  . Bilateral leg edema 02/06/2019  . Generalized anxiety disorder 01/23/2019  . 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 Alcohol withdrawal delirium ([place]) 01/20/2019  . Chronic hepatitis C without hepatic coma ([place]) 12/20/2018  . Thrombocytopenia ([place]) 12/20/2018  . Alcohol abuse 12/20/2018  . Transaminitis 12/20/2018  . Tobacco abuse 12/20/2018  . Easy bruising 12/20/2018  . Weakness 12/20/2018  . Folate deficiency 12/20/2018  . Hepatitis B core antibody negative 12/20/2018  . Charcot-Marie-Tooth disease     Orientation RESPIRATION BLADDER Height & Weight     Self, Time, Situation, Place  Normal Continent, External catheter Weight: 60.8 kg Height:  5\' 2"  (157.5 cm)  BEHAVIORAL SYMPTOMS/MOOD NEUROLOGICAL BOWEL NUTRITION STATUS      Continent Diet (Heart Healthy)  AMBULATORY STATUS COMMUNICATION OF NEEDS Skin   Extensive Assist Verbally Surgical wounds                       Personal Care Assistance Level of Assistance  Bathing, Dressing Bathing Assistance: Limited assistance   Dressing Assistance: Limited assistance     Functional Limitations Info  Sight, Hearing, Speech Sight Info: Adequate Hearing Info: Adequate Speech Info: Adequate    SPECIAL CARE FACTORS FREQUENCY  PT (By licensed PT), OT (By licensed OT)                    Contractures Contractures Info: Not present    Additional Factors Info  Code Status, Allergies Code Status Info: Full code Allergies Info: Tylenol  Current Medications (02/19/2020):  This is the current hospital active medication list Current Facility-Administered Medications  Medication Dose Route Frequency Provider Last Rate Last Admin  . 0.9 %  sodium chloride infusion   Intravenous PRN Wyvonnia Dusky, MD 10 mL/hr at 02/17/20 1500 Restarted at 02/17/20 1500  . Chlorhexidine Gluconate Cloth 2 % PADS 6 each  6 each Topical Once Algernon Huxley,  MD      . dicyclomine (BENTYL) tablet 20 mg  20 mg Oral TID AC & HS Algernon Huxley, MD   20 mg at 02/19/20 1244  . DULoxetine (CYMBALTA) DR capsule 20 mg  20 mg Oral Daily Algernon Huxley, MD   20 mg at 02/19/20 0930  . ferrous sulfate tablet 325 mg  325 mg Oral Q breakfast Algernon Huxley, MD   325 mg at 02/19/20 7035  . folic acid (FOLVITE) tablet 1 mg  1 mg Oral Daily Algernon Huxley, MD   1 mg at 02/19/20 0929  . furosemide (LASIX) tablet 20 mg  20 mg Oral Daily Algernon Huxley, MD   20 mg at 02/18/20 0932  . gabapentin (NEURONTIN) capsule 400 mg  400 mg Oral TID PRN Wyvonnia Dusky, MD   400 mg at 02/19/20 1244  . hydrOXYzine (ATARAX/VISTARIL) tablet 25 mg  25 mg Oral TID PRN Algernon Huxley, MD   25 mg at 02/15/20 0235  . lactulose (CHRONULAC) 10 GM/15ML solution 30 g  30 g Oral TID Algernon Huxley, MD   30 g at 02/18/20 2109  . magnesium hydroxide (MILK OF MAGNESIA) suspension 30 mL  30 mL Oral Daily PRN Algernon Huxley, MD      . magnesium oxide (MAG-OX) tablet 400 mg  400 mg Oral Daily Algernon Huxley, MD   400 mg at 02/19/20 0930  . metoprolol succinate (TOPROL-XL) 24 hr tablet 12.5 mg  12.5 mg Oral Daily Algernon Huxley, MD   12.5 mg at 02/19/20 0928  . midodrine (PROAMATINE) tablet 5 mg  5 mg Oral BID PRN Algernon Huxley, MD      . multivitamin with minerals tablet 1 tablet  1 tablet Oral Daily Algernon Huxley, MD   1 tablet at 02/19/20 0927  . OLANZapine (ZYPREXA) tablet 10 mg  10 mg Oral QHS Algernon Huxley, MD   10 mg at 02/18/20 2109  . ondansetron (ZOFRAN) tablet 4 mg  4 mg Oral Q6H PRN Algernon Huxley, MD       Or  . ondansetron (ZOFRAN) injection 4 mg  4 mg Intravenous Q6H PRN Algernon Huxley, MD   4 mg at 02/16/20 1205  . oxyCODONE (Oxy IR/ROXICODONE) immediate release tablet 10 mg  10 mg Oral Q6H PRN Wyvonnia Dusky, MD   10 mg at 02/19/20 1243  . oxyCODONE (OXYCONTIN) 12 hr tablet 20 mg  20 mg Oral Q12H Wyvonnia Dusky, MD      . pantoprazole (PROTONIX) EC tablet 40 mg  40 mg Oral Daily Algernon Huxley, MD    40 mg at 02/19/20 0928  . senna (SENOKOT) tablet 8.6 mg  1 tablet Oral BID Algernon Huxley, MD   8.6 mg at 02/19/20 0928  . spironolactone (ALDACTONE) tablet 25 mg  25 mg Oral Daily Algernon Huxley, MD   25 mg at 02/19/20 0930  . sucralfate (CARAFATE) tablet 1 g  1 g Oral TID WC & HS Dew, Erskine Squibb, MD   1  g at 02/19/20 1246  . thiamine tablet 100 mg  100 mg Oral Daily Algernon Huxley, MD   100 mg at 02/19/20 0930  . traZODone (DESYREL) tablet 25 mg  25 mg Oral QHS PRN Algernon Huxley, MD   25 mg at 02/15/20 2352  . vitamin B-12 (CYANOCOBALAMIN) tablet 500 mcg  500 mcg Oral Daily Algernon Huxley, MD   500 mcg at 02/19/20 0929  . zolpidem (AMBIEN) tablet 5 mg  5 mg Oral QHS PRN Algernon Huxley, MD         Discharge Medications: Please see discharge summary for a list of discharge medications.  Relevant Imaging Results:  Relevant Lab Results:   Additional Information SS# [id number]  Shelbie Ammons, RN

## 2020-02-19 NOTE — Progress Notes (Signed)
PROGRESS NOTE    Rhonda Martin  GGE:[id number] DOB: 1975/12/18 DOA: 02/14/2020 PCP: Volney American, PA-C  Assessment & Plan:   Active Problems:   Osteomyelitis of right lower extremity (HCC)   Failed right total knee replacement: with associated right lower extremity osteomyelitis and severe nonpurulent knee cellulitis. Continue on IV vanco, zosyn & fluconazole until surgical path is finalized to determine if margins are clean. Previous knee cultures grew stap epi and candidia parapsilosis. W/ wound dehiscence, noncompliance w/ medication and alcohol/drug abuse, ortho surg feels the infection will not resolve so pt will go for R AKA. 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 Continue on oxycodone, gabapentin, oxycontin as pt has severe pain. Increased oxycontin dose to 20mg  BID.  Sepsis: r/o w/ only tachycardia, elevated lactic acid, right knee infection, normal WBC, RR 20 was present on admission.   Leukocytosis: resolved  HTN: continue on home dose of metoprolol, lasix  Hypokalemia: within normal limits. Will continue to monitor   GERD: continue on PPI   Peripheral neuropathy: continue on home dose of gabapentin    Depression: severity unknown. Continue on home dose of duloxetine, olanzapine  Cirrhosis: will continue on home dose of lactulose, spironolactone   Thrombocytopenia: resolved  Transaminitis: likely secondary to cirrhosis. AST is labile.   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 (previous admission, PT/OT recs SNF but pt refused)   Consultants:   Ortho surgery    Procedures:    Antimicrobials: vanco, zosyn    Subjective: Pt c/o severe R stump pain, unchanged from day prior.   Objective: Vitals:   02/18/20 0431 02/18/20 0825 02/18/20 1608 02/18/20 2330  BP: 140/82 (!) 147/97 136/71 136/78  Pulse: 94 (!) 103 96 98  Resp: 18 20 20  18   Temp: 98.5 F (36.9 C) 98.3 F (36.8 C) 98.7 F (37.1 C) 98.3 F (36.8 C)  TempSrc: Oral Oral Oral Oral  SpO2: 96% 96% 96% 98%  Weight:      Height:        Intake/Output Summary (Last 24 hours) at 02/19/2020 0718 Last data filed at 02/18/2020 2134 Gross per 24 hour  Intake --  Output 700 ml  Net -700 ml   Filed Weights   02/14/20 1722  Weight: 60.8 kg    Examination: General exam:Appears calm but uncomfortable  Respiratory system: clear breath sounds b/l. No wheezes, rhonchi  Cardiovascular system:S1 &S2 +. No rubs, gallops or clicks.  Gastrointestinal system:Abdomen is nondistended, soft and nontender. Hypoactive bowel sounds  Central nervous system:Alert and oriented. Psychiatry:Judgement and insight appear normal. Flat mood and affect     Data Reviewed: I have personally reviewed following labs and imaging studies  CBC: Recent Labs  Lab 02/14/20 1757 02/16/20 0620 02/17/20 0946 02/18/20 0841 02/19/20 0353  WBC 5.7 3.4* 9.9 14.2* 8.6  NEUTROABS 3.5  --   --   --   --   HGB 8.7* 8.4* 9.8* 9.4* 9.8*  HCT 27.7* 26.6* 28.6* 29.1* 29.3*  MCV 83.9 83.9 82.9 85.3 84.4  PLT 172 154 158 143* 465   Basic Metabolic Panel: Recent Labs  Lab 02/15/20 0328 02/16/20 0620 02/17/20 0946 02/18/20 0841 02/19/20 0353  NA 135 138 139 136 138  K 3.4* 3.9 4.4 3.8 4.1  CL 104 105 107 104 103  CO2 24 24 25 24 28   GLUCOSE 93 96 210* 118* 120*  BUN 7 8 8 7 9   CREATININE 0.39* 0.51 0.37* 0.35* 0.43*  CALCIUM 8.1* 8.4* 8.1* 8.0* 7.9*   GFR: Estimated Creatinine Clearance: 77.1 mL/min (A) (by C-G formula based on SCr of 0.43 mg/dL (L)). Liver Function Tests: Recent Labs  Lab 02/14/20 1757 02/16/20 0620 02/17/20 0946 02/18/20 0841 02/19/20 0353  AST 67* 49* 41 47* 46*  ALT 26 22 21 24 25   ALKPHOS 109 107 99 93 90  BILITOT 1.2 1.7* 1.3* 1.0 0.7  PROT 7.2 6.7 6.5 6.3* 6.2*  ALBUMIN 2.8* 2.6* 2.5* 2.4* 2.4*   No results for input(s): LIPASE, AMYLASE in  the last 168 hours. Recent Labs  Lab 02/15/20 0328 02/16/20 0620 02/17/20 0946 02/18/20 0841 02/19/20 0353  AMMONIA 20 33 18 46* 34   Coagulation Profile: Recent Labs  Lab 02/14/20 1757 02/16/20 0620  INR 1.4* 1.4*   Cardiac Enzymes: Recent Labs  Lab 02/14/20 1757  CKTOTAL 59   BNP (last 3 results) No results for input(s): PROBNP in the last 8760 hours. HbA1C: No results for input(s): HGBA1C in the last 72 hours. CBG: No results for input(s): GLUCAP in the last 168 hours. Lipid Profile: No results for input(s): CHOL, HDL, LDLCALC, TRIG, CHOLHDL, LDLDIRECT in the last 72 hours. Thyroid Function Tests: No results for input(s): TSH, T4TOTAL, FREET4, T3FREE, THYROIDAB in the last 72 hours. Anemia Panel: No results for input(s): VITAMINB12, FOLATE, FERRITIN, TIBC, IRON, RETICCTPCT in the last 72 hours. Sepsis Labs: Recent Labs  Lab 02/14/20 1757 02/15/20 0336  LATICACIDVEN 3.1* 1.6    Recent Results (from the past 240 hour(s))  Culture, blood (Routine x 2)     Status: None   Collection Time: 02/14/20  5:57 PM   Specimen: BLOOD  Result Value Ref Range Status   Specimen Description BLOOD BLOOD LEFT HAND  Final   Special Requests   Final    BOTTLES DRAWN AEROBIC ONLY Blood Culture results may not be optimal due to an inadequate volume of blood received in culture bottles   Culture   Final    NO GROWTH 5 DAYS Performed at [facility], [address], [place], [place] [address]    Report Status 02/19/2020 FINAL  Final  Culture, blood (Routine x 2)     Status: None   Collection Time: 02/14/20  5:57 PM   Specimen: BLOOD  Result Value Ref Range Status   Specimen Description BLOOD BLOOD RIGHT FOREARM  Final   Special Requests   Final    BOTTLES DRAWN AEROBIC AND ANAEROBIC Blood Culture adequate volume   Culture   Final    NO GROWTH 5 DAYS Performed at [facility], [address] [place],  [address]    Report Status 02/19/2020 FINAL   Final  SARS Coronavirus 2 by RT PCR (hospital order, performed in [facility] hospital lab) Nasopharyngeal Nasopharyngeal Swab     Status: None   Collection Time: 02/14/20 11:58 PM   Specimen: Nasopharyngeal Swab  Result Value Ref Range Status   SARS Coronavirus 2 NEGATIVE NEGATIVE Final    Comment: (NOTE) SARS-CoV-2 target nucleic acids are NOT DETECTED.  The SARS-CoV-2 RNA is generally detectable in upper and lower respiratory specimens during the acute phase of infection. The lowest concentration of SARS-CoV-2 viral copies this assay can detect is 250 copies / mL. A negative result does not preclude SARS-CoV-2 infection and should not be used as the sole basis for treatment or other patient management decisions.  A negative result  may occur with improper specimen collection / handling, submission of specimen other than nasopharyngeal swab, presence of viral mutation(s) within the areas targeted by this assay, and inadequate number of viral copies (<250 copies / mL). A negative result must be combined with clinical observations, patient history, and epidemiological information.  Fact Sheet for Patients:   StrictlyIdeas.no  Fact Sheet for Healthcare Providers: BankingDealers.co.za  This test is not yet approved or  cleared by the [place] FDA and has been authorized for detection and/or diagnosis of SARS-CoV-2 by FDA under an Emergency Use Authorization (EUA).  This EUA will remain in effect (meaning this test can be used) for the duration of the COVID-19 declaration under Section 564(b)(1) of the Act, 21 U.S.C. section 360bbb-3(b)(1), unless the authorization is terminated or revoked sooner.  Performed at [facility], [address], [place], [place] [address]   Surgical PCR screen     Status: None   Collection Time: 02/16/20  1:37 AM   Specimen: Nasal Mucosa; Nasal Swab  Result Value Ref Range Status   MRSA,  PCR NEGATIVE NEGATIVE Final   Staphylococcus aureus NEGATIVE NEGATIVE Final    Comment: (NOTE) The Xpert SA Assay (FDA approved for NASAL specimens in patients 20 years of age and older), is one component of a comprehensive surveillance program. It is not intended to diagnose infection nor to guide or monitor treatment. Performed at [facility], [address], [place], [place] [address]          Radiology Studies: No results found.      Scheduled Meds:  Chlorhexidine Gluconate Cloth  6 each Topical Once   dicyclomine  20 mg Oral TID AC & HS   DULoxetine  20 mg Oral Daily   ferrous sulfate  325 mg Oral Q breakfast   fluconazole  400 mg Oral Daily   folic acid  1 mg Oral Daily   furosemide  20 mg Oral Daily   lactulose  30 g Oral TID   magnesium oxide  400 mg Oral Daily   metoprolol succinate  12.5 mg Oral Daily   multivitamin with minerals  1 tablet Oral Daily   OLANZapine  10 mg Oral QHS   oxyCODONE  10 mg Oral Q12H   pantoprazole  40 mg Oral Daily   senna  1 tablet Oral BID   spironolactone  25 mg Oral Daily   sucralfate  1 g Oral TID WC & HS   thiamine  100 mg Oral Daily   vitamin B-12  500 mcg Oral Daily   Continuous Infusions:  sodium chloride 10 mL/hr at 02/17/20 1500   linezolid (ZYVOX) IV 600 mg (02/18/20 2114)   piperacillin-tazobactam (ZOSYN)  IV 3.375 g (02/19/20 0527)     LOS: 5 days    Time spent: 34 mins     Wyvonnia Dusky, MD [facility] Pager [phone]  If 7PM-7AM, please contact night-coverage [email or web address] 02/19/2020, 7:18 AM

## 2020-02-19 NOTE — Progress Notes (Signed)
Pt is in a deep state of sleep in recliner chair.  Pt have removed dressing and ace wrap from  amputated surgical site. Will educate/ monitor

## 2020-02-19 NOTE — Progress Notes (Signed)
[facility] Daily Progress Note   Subjective: Patient experiencing phantom pain.  Objective: Vitals:   02/18/20 0825 02/18/20 1608 02/18/20 2330 02/19/20 0724  BP: (!) 147/97 136/71 136/78 115/81  Pulse: (!) 103 96 98 86  Resp: 20 20 18 20   Temp: 98.3 F (36.8 C) 98.7 F (37.1 C) 98.3 F (36.8 C) 98.2 F (36.8 C)  TempSrc: Oral Oral Oral Oral  SpO2: 96% 96% 98% 98%  Weight:      Height:        Intake/Output Summary (Last 24 hours) at 02/19/2020 1200 Last data filed at 02/19/2020 0900 Gross per 24 hour  Intake 480 ml  Output 1600 ml  Net -1120 ml   Physical Exam: A&Ox3, NAD CV: RRR Pulmonary: CTA Bilaterally Abdomen: Soft, Nontender, Nondistended Vascular:  Right Lower Extremity: Thigh soft.    OR dressing removed: Stump - clean, dry and intact.   Laboratory: CBC    Component Value Date/Time   WBC 8.6 02/19/2020 0353   HGB 9.8 (L) 02/19/2020 0353   HGB 9.9 (L) 11/10/2019 1601   HCT 29.3 (L) 02/19/2020 0353   HCT 29.1 (L) 11/10/2019 1601   PLT 159 02/19/2020 0353   PLT 146 (L) 11/10/2019 1601   BMET    Component Value Date/Time   NA 138 02/19/2020 0353   NA 135 11/10/2019 1601   K 4.1 02/19/2020 0353   CL 103 02/19/2020 0353   CO2 28 02/19/2020 0353   GLUCOSE 120 (H) 02/19/2020 0353   BUN 9 02/19/2020 0353   BUN 4 (L) 11/10/2019 1601   CREATININE 0.43 (L) 02/19/2020 0353   CALCIUM 7.9 (L) 02/19/2020 0353   GFRNONAA >60 02/19/2020 0353   GFRAA >60 02/19/2020 0353   Assessment/Planning: The patient is a 44 year old female s/p right AKA   1) Hbg stable 2) OR dressing removed. Stump is healthy. 3) Good arterial flow noted during surgery, should heal well 4) Dressing changes daily 5) Will see patient in three weeks for first post-op visit  Discussed with Dr. Ellis Parents Khristie Sak PA-C 02/19/2020 12:00 PM

## 2020-02-20 ENCOUNTER — Ambulatory Visit: Payer: Medicaid Other | Admitting: Physician Assistant

## 2020-02-20 LAB — CBC
HCT: 27.6 % — ABNORMAL LOW (ref 36.0–46.0)
Hemoglobin: 9 g/dL — ABNORMAL LOW (ref 12.0–15.0)
MCH: 27.6 pg (ref 26.0–34.0)
MCHC: 32.6 g/dL (ref 30.0–36.0)
MCV: 84.7 fL (ref 80.0–100.0)
Platelets: 151 10*3/uL (ref 150–400)
RBC: 3.26 MIL/uL — ABNORMAL LOW (ref 3.87–5.11)
RDW: 17.3 % — ABNORMAL HIGH (ref 11.5–15.5)
WBC: 9.1 10*3/uL (ref 4.0–10.5)
nRBC: 0 % (ref 0.0–0.2)

## 2020-02-20 LAB — SURGICAL PATHOLOGY

## 2020-02-20 LAB — COMPREHENSIVE METABOLIC PANEL
ALT: 23 U/L (ref 0–44)
AST: 43 U/L — ABNORMAL HIGH (ref 15–41)
Albumin: 2.3 g/dL — ABNORMAL LOW (ref 3.5–5.0)
Alkaline Phosphatase: 78 U/L (ref 38–126)
Anion gap: 8 (ref 5–15)
BUN: 10 mg/dL (ref 6–20)
CO2: 27 mmol/L (ref 22–32)
Calcium: 8.1 mg/dL — ABNORMAL LOW (ref 8.9–10.3)
Chloride: 101 mmol/L (ref 98–111)
Creatinine, Ser: 0.42 mg/dL — ABNORMAL LOW (ref 0.44–1.00)
GFR calc Af Amer: >60 mL/min (ref >60)
GFR calc non Af Amer: >60 mL/min (ref >60)
Glucose, Bld: 134 mg/dL — ABNORMAL HIGH (ref 70–99)
Potassium: 3.8 mmol/L (ref 3.5–5.1)
Sodium: 136 mmol/L (ref 135–145)
Total Bilirubin: 0.7 mg/dL (ref 0.3–1.2)
Total Protein: 5.9 g/dL — ABNORMAL LOW (ref 6.5–8.1)

## 2020-02-20 LAB — AMMONIA: Ammonia: 43 umol/L — ABNORMAL HIGH (ref 9–35)

## 2020-02-20 MED ORDER — ENOXAPARIN SODIUM 40 MG/0.4ML ~~LOC~~ SOLN
40.0000 mg | SUBCUTANEOUS | Status: DC
  Administered 2020-02-20 – 2020-03-04 (×14): 40 mg via SUBCUTANEOUS
  Filled 2020-02-20 (×14): qty 0.4

## 2020-02-20 NOTE — Progress Notes (Signed)
PROGRESS NOTE    Rhonda Martin  EXB:[id number] DOB: [birth year] DOA: 02/14/2020 PCP: Volney American, PA-C  Assessment & Plan:   Active Problems:   Osteomyelitis of right lower extremity (HCC)   Failed right total knee replacement: with associated right lower extremity osteomyelitis and severe nonpurulent knee cellulitis. D/c IV vanco, zosyn & fluconazole as wound is clean as per vascular surg. Surgical path is pending still. Previous knee cultures grew stap epi and candidia parapsilosis. W/ wound dehiscence, noncompliance w/ medication and alcohol/drug abuse, ortho surg feels the infection will not resolve so pt will go for R AKA. 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 Continue on oxycodone, gabapentin, oxycontin as pt has severe pain. Improved pain   Sepsis: r/o w/ only tachycardia, elevated lactic acid, right knee infection, normal WBC, RR 20 was present on admission.   Leukocytosis: resolved  HTN: continue on home dose of metoprolol, lasix  Hypokalemia: continued to be WNL  GERD: continue on pantoprazole   Peripheral neuropathy: continue on home dose of neurontin   Depression: severity unknown. Continue on home dose of SSRI, olanzapine  Cirrhosis: will continue on home dose of lactulose, spironolactone   Thrombocytopenia: resolved  Transaminitis: likely secondary to cirrhosis. AST is labile.   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 (previous admission, PT/OT recs SNF but pt refused). Waiting on bed placement for SNF  Status is: Inpatient  Remains inpatient appropriate because:IV treatments appropriate due to intensity of illness or inability to take PO, stable for d/c to SNF, waiting on bed placement    Dispo: The patient is from: Home              Anticipated d/c is to: SNF              Anticipated d/c date is: whenever a SNF bed is available                Patient currently is medically stable to d/c.     Consultants:   Ortho surgery    Procedures:   Antimicrobials:   Subjective: Pt c/o R stump pain improved from day prior   Objective: Vitals:   02/18/20 2330 02/19/20 0724 02/19/20 1552 02/19/20 2341  BP: 136/78 115/81 116/75 139/82  Pulse: 98 86 (!) 101 (!) 107  Resp: 18 20 20    Temp: 98.3 F (36.8 C) 98.2 F (36.8 C) 98.2 F (36.8 C) 99.5 F (37.5 C)  TempSrc: Oral Oral Oral Oral  SpO2: 98% 98% 96% 96%  Weight:      Height:        Intake/Output Summary (Last 24 hours) at 02/20/2020 0715 Last data filed at 02/20/2020 0345 Gross per 24 hour  Intake 1858.19 ml  Output 1850 ml  Net 8.19 ml   Filed Weights   02/14/20 1722  Weight: 60.8 kg    Examination: General exam:Appears calm and comfortable  Respiratory system:clear breath sounds b/l. No rales, rhonchi  Cardiovascular system:S1 &S2 +. No rubs, gallops or clicks.  Gastrointestinal system:Abdomen is nondistended, soft and nontender. Normal bowel sounds  Central nervous system:Alert and oriented. Moves all 4 extremities Psychiatry:Judgement and insight appear normal. Normal mood and affect     Data Reviewed: I have personally reviewed following labs and imaging studies  CBC: Recent Labs  Lab 02/14/20 1757 02/14/20 1757 02/16/20 1027 02/17/20 2536 02/18/20 6440 02/19/20 0353 02/20/20 3474  WBC 5.7   < > 3.4* 9.9 14.2* 8.6 9.1  NEUTROABS 3.5  --   --   --   --   --   --   HGB 8.7*   < > 8.4* 9.8* 9.4* 9.8* 9.0*  HCT 27.7*   < > 26.6* 28.6* 29.1* 29.3* 27.6*  MCV 83.9   < > 83.9 82.9 85.3 84.4 84.7  PLT 172   < > 154 158 143* 159 151   < > = values in this interval not displayed.   Basic Metabolic Panel: Recent Labs  Lab 02/16/20 0620 02/17/20 0946 02/18/20 0841 02/19/20 0353 02/20/20 0322  NA 138 139 136 138 136  K 3.9 4.4 3.8 4.1 3.8  CL 105 107 104 103 101  CO2 24 25 24 28 27   GLUCOSE 96 210* 118* 120* 134*  BUN 8  8 7 9 10   CREATININE 0.51 0.37* 0.35* 0.43* 0.42*  CALCIUM 8.4* 8.1* 8.0* 7.9* 8.1*   GFR: Estimated Creatinine Clearance: 77.1 mL/min (A) (by C-G formula based on SCr of 0.42 mg/dL (L)). Liver Function Tests: Recent Labs  Lab 02/16/20 0620 02/17/20 0946 02/18/20 0841 02/19/20 0353 02/20/20 0322  AST 49* 41 47* 46* 43*  ALT 22 21 24 25 23   ALKPHOS 107 99 93 90 78  BILITOT 1.7* 1.3* 1.0 0.7 0.7  PROT 6.7 6.5 6.3* 6.2* 5.9*  ALBUMIN 2.6* 2.5* 2.4* 2.4* 2.3*   No results for input(s): LIPASE, AMYLASE in the last 168 hours. Recent Labs  Lab 02/16/20 0620 02/17/20 0946 02/18/20 0841 02/19/20 0353 02/20/20 0322  AMMONIA 33 18 46* 34 43*   Coagulation Profile: Recent Labs  Lab 02/14/20 1757 02/16/20 0620  INR 1.4* 1.4*   Cardiac Enzymes: Recent Labs  Lab 02/14/20 1757  CKTOTAL 59   BNP (last 3 results) No results for input(s): PROBNP in the last 8760 hours. HbA1C: No results for input(s): HGBA1C in the last 72 hours. CBG: No results for input(s): GLUCAP in the last 168 hours. Lipid Profile: No results for input(s): CHOL, HDL, LDLCALC, TRIG, CHOLHDL, LDLDIRECT in the last 72 hours. Thyroid Function Tests: No results for input(s): TSH, T4TOTAL, FREET4, T3FREE, THYROIDAB in the last 72 hours. Anemia Panel: No results for input(s): VITAMINB12, FOLATE, FERRITIN, TIBC, IRON, RETICCTPCT in the last 72 hours. Sepsis Labs: Recent Labs  Lab 02/14/20 1757 02/15/20 0336  LATICACIDVEN 3.1* 1.6    Recent Results (from the past 240 hour(s))  Culture, blood (Routine x 2)     Status: None   Collection Time: 02/14/20  5:57 PM   Specimen: BLOOD  Result Value Ref Range Status   Specimen Description BLOOD BLOOD LEFT HAND  Final   Special Requests   Final    BOTTLES DRAWN AEROBIC ONLY Blood Culture results may not be optimal due to an inadequate volume of blood received in culture bottles   Culture   Final    NO GROWTH 5 DAYS Performed at [facility], [address] [place], [place] [address]    Report Status 02/19/2020 FINAL  Final  Culture, blood (Routine x 2)     Status: None   Collection Time: 02/14/20  5:57 PM   Specimen: BLOOD  Result Value Ref Range Status   Specimen Description BLOOD BLOOD RIGHT FOREARM  Final   Special Requests   Final    BOTTLES DRAWN AEROBIC AND ANAEROBIC Blood Culture adequate volume   Culture   Final    NO GROWTH 5 DAYS  Performed at [facility], [address] [place], [place] [address]    Report Status 02/19/2020 FINAL  Final  SARS Coronavirus 2 by RT PCR (hospital order, performed in [facility] hospital lab) Nasopharyngeal Nasopharyngeal Swab     Status: None   Collection Time: 02/14/20 11:58 PM   Specimen: Nasopharyngeal Swab  Result Value Ref Range Status   SARS Coronavirus 2 NEGATIVE NEGATIVE Final    Comment: (NOTE) SARS-CoV-2 target nucleic acids are NOT DETECTED.  The SARS-CoV-2 RNA is generally detectable in upper and lower respiratory specimens during the acute phase of infection. The lowest concentration of SARS-CoV-2 viral copies this assay can detect is 250 copies / mL. A negative result does not preclude SARS-CoV-2 infection and should not be used as the sole basis for treatment or other patient management decisions.  A negative result may occur with improper specimen collection / handling, submission of specimen other than nasopharyngeal swab, presence of viral mutation(s) within the areas targeted by this assay, and inadequate number of viral copies (<250 copies / mL). A negative result must be combined with clinical observations, patient history, and epidemiological information.  Fact Sheet for Patients:   StrictlyIdeas.no  Fact Sheet for Healthcare Providers: BankingDealers.co.za  This test is not yet approved or  cleared by the [place] FDA and has been authorized for detection and/or diagnosis of SARS-CoV-2  by FDA under an Emergency Use Authorization (EUA).  This EUA will remain in effect (meaning this test can be used) for the duration of the COVID-19 declaration under Section 564(b)(1) of the Act, 21 U.S.C. section 360bbb-3(b)(1), unless the authorization is terminated or revoked sooner.  Performed at [facility], [address], [place], [place] [address]   Surgical PCR screen     Status: None   Collection Time: 02/16/20  1:37 AM   Specimen: Nasal Mucosa; Nasal Swab  Result Value Ref Range Status   MRSA, PCR NEGATIVE NEGATIVE Final   Staphylococcus aureus NEGATIVE NEGATIVE Final    Comment: (NOTE) The Xpert SA Assay (FDA approved for NASAL specimens in patients 20 years of age and older), is one component of a comprehensive surveillance program. It is not intended to diagnose infection nor to guide or monitor treatment. Performed at [facility], [address] [place], [place] [address]          Radiology Studies: No results found.      Scheduled Meds: . Chlorhexidine Gluconate Cloth  6 each Topical Once  . dicyclomine  20 mg Oral TID AC & HS  . DULoxetine  20 mg Oral Daily  . ferrous sulfate  325 mg Oral Q breakfast  . folic acid  1 mg Oral Daily  . furosemide  20 mg Oral Daily  . lactulose  30 g Oral TID  . magnesium oxide  400 mg Oral Daily  . metoprolol succinate  12.5 mg Oral Daily  . multivitamin with minerals  1 tablet Oral Daily  . OLANZapine  10 mg Oral QHS  . oxyCODONE  20 mg Oral Q12H  . pantoprazole  40 mg Oral Daily  . senna  1 tablet Oral BID  . spironolactone  25 mg Oral Daily  . sucralfate  1 g Oral TID WC & HS  . thiamine  100 mg Oral Daily  . vitamin B-12  500 mcg Oral Daily   Continuous Infusions: . sodium chloride 10 mL/hr at 02/17/20 1500     LOS: 6 days    Time  spent: 31 mins     Wyvonnia Dusky, MD [facility] Pager [phone]  If 7PM-7AM, please contact  night-coverage [email or web address] 02/20/2020, 7:15 AM

## 2020-02-20 NOTE — Progress Notes (Signed)
Physical Therapy Treatment Patient Details Name: Rhonda Martin MRN: [id number] DOB: 1976-05-11 Today's Date: 02/20/2020    History of Present Illness Rhonda Martin is a 44 y.o. Caucasian female with a known history of Charcot- Marie-Tooth disease, alcohol abuse, anxiety/depression, hepatitis C, narcotic/polysubstance abuse, liver cirrhosis, who underwent right total knee arthroplasty on 08/08/2949 that was complicated by wound dehiscence and bleeding for which she had a right total knee revision on 8/29 by Dr. Rudene Christians at which time she was apparently found to have osteomyelitis, who presented to the emergency room with acute onset of suspected dislocation and worsening swelling, tenderness and pain on her right knee. 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    PT Comments    Patient received in bed, appears comfortable when at rest. Agreeable to PT session. Patient requires increased time with mobility due to pain in right leg with movement. Performed bed mobility with mod independence. Transferred bed to recliner via squat pivot per her request to not use walker this session. Patient is very pain limited and likes to do things at her own. She will continue to benefit from skilled PT while here to improve strength, functional independence and safety with mobility.      Follow Up Recommendations  SNF;Supervision for mobility/OOB     Equipment Recommendations  None recommended by PT;Other (comment) (TBD)    Recommendations for Other Services       Precautions / Restrictions Precautions Precautions: Fall Precaution Comments: mod fall Restrictions Weight Bearing Restrictions: Yes RLE Weight Bearing: Non weight bearing    Mobility  Bed Mobility Overal bed mobility: Modified Independent       Supine to sit: Modified independent (Device/Increase time);HOB elevated     General bed mobility comments: Pt performed supine to sit with supervision for safety. HOB raised and pt required increased time and   attempted to provide verbal cues, however patient prefers to do things her way. Pt requires several rest breaks secondary to increased pain.  Transfers Overall transfer level: Needs assistance   Transfers: Squat Pivot Transfers     Squat pivot transfers: Min assist     General transfer comment: Patient states "I dont think I can do this today." with encouragement patient able to transfer to recliner.  Ambulation/Gait                 Stairs             Wheelchair Mobility    Modified Rankin (Stroke Patients Only)       Balance Overall balance assessment: Needs assistance Sitting-balance support: Feet supported Sitting balance-Leahy Scale: Good Sitting balance - Comments: BUE supporting RLE at times and pt with good static balance   Standing balance support: Single extremity supported;During functional activity Standing balance-Leahy Scale: Fair Standing balance comment: requires physical assistance to balance with single UE on recliner during transfer                            Cognition Arousal/Alertness: Awake/alert Behavior During Therapy: Anxious Overall Cognitive Status: Within Functional Limits for tasks assessed                                 General Comments: Poor command following, requires VCs t/o for safety       Exercises Total Joint Exercises Quad Sets: AROM;10 reps;Right    General Comments  Pertinent Vitals/Pain Pain Assessment: 0-10 Pain Score: 10-Worst pain ever Pain Location: RLE Pain Descriptors / Indicators: Burning;Discomfort;Grimacing;Guarding;Moaning Pain Intervention(s): Monitored during session;Repositioned;Premedicated before session    Home Living                      Prior Function            PT Goals (current goals can now be found in the care plan section) Acute Rehab PT Goals Patient Stated Goal: Get pain under control PT Goal Formulation: With patient Time For  Goal Achievement: 03/02/20 Potential to Achieve Goals: Fair Additional Goals Additional Goal #1: Pt will perform all bed mobility and transfers with minimal assistance in order to perform ADLs with minimal difficulty. Progress towards PT goals: Progressing toward goals    Frequency    7X/week      PT Plan Current plan remains appropriate    Co-evaluation              AM-PAC PT "6 Clicks" Mobility   Outcome Measure  Help needed turning from your back to your side while in a flat bed without using bedrails?: A Little Help needed moving from lying on your back to sitting on the side of a flat bed without using bedrails?: A Little Help needed moving to and from a bed to a chair (including a wheelchair)?: A Lot Help needed standing up from a chair using your arms (e.g., wheelchair or bedside chair)?: A Lot Help needed to walk in hospital room?: Total Help needed climbing 3-5 steps with a railing? : Total 6 Click Score: 12    End of Session Equipment Utilized During Treatment: Gait belt Activity Tolerance: Patient limited by pain Patient left: in chair;with call bell/phone within reach;with chair alarm set Nurse Communication: Mobility status PT Visit Diagnosis: Unsteadiness on feet (R26.81);Other abnormalities of gait and mobility (R26.89);Muscle weakness (generalized) (M62.81);Difficulty in walking, not elsewhere classified (R26.2);Pain Pain - Right/Left: Right Pain - part of body: Leg     Time: 1100-1118 PT Time Calculation (min) (ACUTE ONLY): 18 min  Charges:  $Therapeutic Activity: 8-22 mins                     [employer], PT, [facility] 02/20/20,12:21 PM

## 2020-02-20 NOTE — Progress Notes (Signed)
OT Cancellation Note  Patient Details Name: Rhonda Martin MRN: [id number] DOB: 1975-10-13   Cancelled Treatment:    Reason Eval/Treat Not Completed: Pain limiting ability to participate;Fatigue/lethargy limiting ability to participate. Upon arrival pt reclined in chair, endorsing fatigue and pain. Refuses tx this date, eyes closed t/o conversation. Will follow and reattempt at later date/time.   Dessie Coma, M.S. OTR/L  02/20/20, 2:09 PM  ascom [phone]

## 2020-02-21 ENCOUNTER — Encounter: Payer: Self-pay | Admitting: Physician Assistant

## 2020-02-21 DIAGNOSIS — M21961 Unspecified acquired deformity of right lower leg: Secondary | ICD-10-CM

## 2020-02-21 NOTE — Progress Notes (Signed)
PROGRESS NOTE    Rhonda Martin  HUD:[id number] DOB: May 31, 1976 DOA: 02/14/2020 PCP: Volney American, PA-C   Subjective: Complaining of her right stump pain Otherwise hemodynamically stable no issues overnight  Note review--patient consistently been complaining of pain morning more more narcotics   Hospital course Rhonda Martin is a 44 y.o. Caucasian female with a known history of Charcot- Marie-Tooth disease, alcohol abuse, anxiety/depression, hepatitis C, narcotic/polysubstance abuse, liver cirrhosis, who underwent right total knee arthroplasty on 01/10/8849 that was complicated by wound dehiscence and bleeding for which she had a right total knee revision on 8/29 by Dr. Rudene Christians at which time she was apparently found to have osteomyelitis, subsequently presented  with acute onset of suspected dislocation and worsening swelling, tenderness and pain on her right knee.  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 On antibiotics   Assessment & Plan:   Active Problems:   Osteomyelitis of right lower extremity (HCC)   Failed right total knee replacement: with associated right lower extremity osteomyelitis and severe nonpurulent knee cellulitis.  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 Surgical path is pending still.  Previous knee cultures grew stap epi and candidia parapsilosis. W/ wound dehiscence, noncompliance w/ medication and alcohol/drug abuse, ortho surg feels the infection will not resolve so pt will go for R AKA. 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 Continue on oxycodone, gabapentin, oxycontin as pt has severe pain.  Continues to complain of pain--- above medication will be titrated   Sepsis: -Resolved   r/o w/ only tachycardia, elevated lactic acid, right knee infection, normal WBC, RR 20 was present on admission.   Leukocytosis: resolved  HTN: Monitoring, continue home medication metoprolol, Lasix , as needed  hydralazine Hypokalemia: Stable repleting accordingly GERD: continue on pantoprazole   Peripheral neuropathy:  Stable, continue home dose Neurontin   Depression: Stable,. Continue on home dose of SSRI, olanzapine  Cirrhosis: No signs of asterixis, encephalopathy, continue lactulose and spironolactone  Thrombocytopenia: Monitoring, resolved  Transaminitis: likely secondary to cirrhosis. AST is labile.   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 (previous admission, PT/OT recs SNF but pt refused). Waiting on bed placement for SNF  Status is: Inpatient  Remains inpatient appropriate because:IV treatments appropriate due to intensity of illness or inability to take PO, stable for d/c to SNF, waiting on bed placement    Dispo: The patient is from: Home              Anticipated d/c is to: SNF              Anticipated d/c date is: whenever a SNF bed is available               Patient currently is medically stable to d/c.     Consultants:   Ortho surgery    Procedures:   Antimicrobials:     Objective: Vitals:   02/20/20 1600 02/20/20 2329 02/21/20 0720 02/21/20 0914  BP:  (!) 136/58 132/66 139/73  Pulse: (!) 108 96 96 96  Resp:  18 16 16   Temp:  98.4 F (36.9 C) 99.1 F (37.3 C) 98.8 F (37.1 C)  TempSrc:  Oral  Axillary  SpO2:  98% 97% 97%  Weight:      Height:        Intake/Output Summary (Last  24 hours) at 02/21/2020 1332 Last data filed at 02/21/2020 0058 Gross per 24 hour  Intake 240 ml  Output 1300 ml  Net -1060 ml   Filed Weights   02/14/20 1722  Weight: 60.8 kg       Physical Exam:   General:  Alert, oriented, cooperative, no distress;   HEENT:  Normocephalic, PERRL, otherwise with in Normal limits   Neuro:  CNII-XII intact. , normal motor and sensation, reflexes intact   Lungs:   Clear to auscultation BL,  Respirations unlabored, no wheezes / crackles  Cardio:    S1/S2, RRR, No murmure, No Rubs or Gallops   Abdomen:   Soft, non-tender, bowel sounds active all four quadrants,  no guarding or peritoneal signs.  Muscular skeletal:   Right leg, S/P AKA,  Limited exam - in bed, able to move all 4 extremities, Normal strength,  2+ pulses,  symmetric, No pitting edema  Skin:  Dry, warm to touch, negative for any Rashes, right leg stump surgical wound, dressing in place  Wounds: Right leg stump surgical wound,            Data Reviewed: I have personally reviewed following labs and imaging studies  CBC: Recent Labs  Lab 02/14/20 1757 02/14/20 1757 02/16/20 0620 02/17/20 0946 02/18/20 0841 02/19/20 0353 02/20/20 0322  WBC 5.7   < > 3.4* 9.9 14.2* 8.6 9.1  NEUTROABS 3.5  --   --   --   --   --   --   HGB 8.7*   < > 8.4* 9.8* 9.4* 9.8* 9.0*  HCT 27.7*   < > 26.6* 28.6* 29.1* 29.3* 27.6*  MCV 83.9   < > 83.9 82.9 85.3 84.4 84.7  PLT 172   < > 154 158 143* 159 151   < > = values in this interval not displayed.   Basic Metabolic Panel: Recent Labs  Lab 02/16/20 0620 02/17/20 0946 02/18/20 0841 02/19/20 0353 02/20/20 0322  NA 138 139 136 138 136  K 3.9 4.4 3.8 4.1 3.8  CL 105 107 104 103 101  CO2 24 25 24 28 27   GLUCOSE 96 210* 118* 120* 134*  BUN 8 8 7 9 10   CREATININE 0.51 0.37* 0.35* 0.43* 0.42*  CALCIUM 8.4* 8.1* 8.0* 7.9* 8.1*   GFR: Estimated Creatinine Clearance: 77.1 mL/min (A) (by C-G formula based on SCr of 0.42 mg/dL (L)). Liver Function Tests: Recent Labs  Lab 02/16/20 0620 02/17/20 0946 02/18/20 0841 02/19/20 0353 02/20/20 0322  AST 49* 41 47* 46* 43*  ALT 22 21 24 25 23   ALKPHOS 107 99 93 90 78  BILITOT 1.7* 1.3* 1.0 0.7 0.7  PROT 6.7 6.5 6.3* 6.2* 5.9*  ALBUMIN 2.6* 2.5* 2.4* 2.4* 2.3*   No results for input(s): LIPASE, AMYLASE in the last 168 hours. Recent Labs  Lab 02/16/20 0620 02/17/20 0946 02/18/20 0841 02/19/20 0353 02/20/20 0322    AMMONIA 33 18 46* 34 43*   Coagulation Profile: Recent Labs  Lab 02/14/20 1757 02/16/20 0620  INR 1.4* 1.4*   Cardiac Enzymes: Recent Labs  Lab 02/14/20 1757  CKTOTAL 59   Recent Labs  Lab 02/14/20 1757 02/15/20 0336  LATICACIDVEN 3.1* 1.6    Recent Results (from the past 240 hour(s))  Culture, blood (Routine x 2)     Status: None   Collection Time: 02/14/20  5:57 PM   Specimen: BLOOD  Result Value Ref Range Status   Specimen Description BLOOD BLOOD LEFT HAND  Final   Special Requests   Final    BOTTLES DRAWN AEROBIC ONLY Blood Culture results may not be optimal due to an inadequate volume of blood received in culture bottles   Culture   Final    NO GROWTH 5 DAYS Performed at [facility], [address] [place], [place] [address]    Report Status 02/19/2020 FINAL  Final  Culture, blood (Routine x 2)     Status: None   Collection Time: 02/14/20  5:57 PM   Specimen: BLOOD  Result Value Ref Range Status   Specimen Description BLOOD BLOOD RIGHT FOREARM  Final   Special Requests   Final    BOTTLES DRAWN AEROBIC AND ANAEROBIC Blood Culture adequate volume   Culture   Final    NO GROWTH 5 DAYS Performed at [facility], [address], [place], [place] [address]    Report Status 02/19/2020 FINAL  Final  SARS Coronavirus 2 by RT PCR (hospital order, performed in [facility] hospital lab) Nasopharyngeal Nasopharyngeal Swab     Status: None   Collection Time: 02/14/20 11:58 PM   Specimen: Nasopharyngeal Swab  Result Value Ref Range Status   SARS Coronavirus 2 NEGATIVE NEGATIVE Final    Comment: (NOTE) SARS-CoV-2 target nucleic acids are NOT DETECTED.  The SARS-CoV-2 RNA is generally detectable in upper and lower respiratory specimens during the acute phase of infection. The lowest concentration of SARS-CoV-2 viral copies this assay can detect is 250 copies / mL. A negative result does not preclude SARS-CoV-2 infection and should not be used as  the sole basis for treatment or other patient management decisions.  A negative result may occur with improper specimen collection / handling, submission of specimen other than nasopharyngeal swab, presence of viral mutation(s) within the areas targeted by this assay, and inadequate number of viral copies (<250 copies / mL). A negative result must be combined with clinical observations, patient history, and epidemiological information.  Fact Sheet for Patients:   StrictlyIdeas.no  Fact Sheet for Healthcare Providers: BankingDealers.co.za  This test is not yet approved or  cleared by the [place] FDA and has been authorized for detection and/or diagnosis of SARS-CoV-2 by FDA under an Emergency Use Authorization (EUA).  This EUA will remain in effect (meaning this test can be used) for the duration of the COVID-19 declaration under Section 564(b)(1) of the Act, 21 U.S.C. section 360bbb-3(b)(1), unless the authorization is terminated or revoked sooner.  Performed at [facility], [address], [place], [place] [address]   Surgical PCR screen     Status: None   Collection Time: 02/16/20  1:37 AM   Specimen: Nasal Mucosa; Nasal Swab  Result Value Ref Range Status   MRSA, PCR NEGATIVE NEGATIVE Final   Staphylococcus aureus NEGATIVE NEGATIVE Final    Comment: (NOTE) The Xpert SA Assay (FDA approved for NASAL specimens in patients 29 years of age and older), is one component of a comprehensive surveillance program. It is not intended to diagnose infection nor to guide or monitor treatment. Performed at [facility], [address] [place], [place] [address]          Radiology Studies: No results found.      Scheduled Meds: . Chlorhexidine Gluconate Cloth  6 each Topical Once  . dicyclomine  20 mg Oral TID AC & HS  . DULoxetine  20 mg Oral Daily  . enoxaparin (LOVENOX) injection  40 mg  Subcutaneous Q24H  . ferrous sulfate  325 mg Oral Q breakfast  . folic acid  1 mg Oral Daily  . furosemide  20 mg Oral Daily  . lactulose  30 g Oral TID  . magnesium oxide  400 mg Oral Daily  . metoprolol succinate  12.5 mg Oral Daily  . multivitamin with minerals  1 tablet Oral Daily  . OLANZapine  10 mg Oral QHS  . oxyCODONE  20 mg Oral Q12H  . pantoprazole  40 mg Oral Daily  . senna  1 tablet Oral BID  . spironolactone  25 mg Oral Daily  . sucralfate  1 g Oral TID WC & HS  . thiamine  100 mg Oral Daily  . vitamin B-12  500 mcg Oral Daily   Continuous Infusions: . sodium chloride 10 mL/hr at 02/17/20 1500     LOS: 7 days    Time spent: 31 mins     Deatra James, MD [facility] Pager [phone]  If 7PM-7AM, please contact night-coverage [email or web address] 02/21/2020, 1:32 PM

## 2020-02-21 NOTE — Progress Notes (Signed)
Occupational Therapy Treatment Patient Details Name: Rhonda Martin MRN: [id number] DOB: 1976/04/14 Today's Date: 02/21/2020    History of present illness Rhonda Martin is a 44 y.o. Caucasian female with a known history of Charcot- Marie-Tooth disease, alcohol abuse, anxiety/depression, hepatitis C, narcotic/polysubstance abuse, liver cirrhosis, who underwent right total knee arthroplasty on 10/11/8125 that was complicated by wound dehiscence and bleeding for which she had a right total knee revision on 8/29 by Dr. Rudene Christians at which time she was apparently found to have osteomyelitis, who presented to the emergency room with acute onset of suspected dislocation and worsening swelling, tenderness and pain on her right knee. 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   OT comments  Pt supine in bed upon entering the room and reports 10/10 pain in R residual limb. RN arrived to give medications during session. OT educated pt on use of light tapping and rubbing of residual limb for pain management which pt did and she also allowed therapist to perform this action as well with reports of , " It is a little better". Pt's dressing falling off of residual limb. Pt requesting to remove it herself secondary to pain which she did with increased time and encouragement. Pt wanting to wrap herself but she has not been educated on proper way to perform. OT educating and demonstrating figure eight wrapping this session on residual limb. OT then discussing relaxation techniques for pain management to which pt enjoys listening to musing and watching disney movies. OT suggested we incorporate some of this during next treatment session while exercising or working on transfer tasks. Pt is agreeable.   Follow Up Recommendations  SNF    Equipment Recommendations  Other (comment) (defer to next venue of care)       Precautions / Restrictions Precautions Precautions: Fall Restrictions Weight Bearing Restrictions: Yes RLE Weight Bearing: Non  weight bearing       Mobility Bed Mobility Overal bed mobility: Needs Assistance Bed Mobility: Rolling Rolling: Supervision            Transfers    General transfer comment: deferred secondary to pain        ADL either performed or assessed with clinical judgement                  Cognition Arousal/Alertness: Awake/alert Behavior During Therapy: Anxious Overall Cognitive Status: Within Functional Limits for tasks assessed                      Pertinent Vitals/ Pain       Pain Assessment: 0-10 Pain Score: 10-Worst pain ever Pain Location: RLE Pain Descriptors / Indicators: Burning;Discomfort;Grimacing;Guarding;Moaning Pain Intervention(s): Monitored during session;Limited activity within patient's tolerance;Repositioned;RN gave pain meds during session         Frequency  Min 1X/week        Progress Toward Goals  OT Goals(current goals can now be found in the care plan section)  Progress towards OT goals: Progressing toward goals  Acute Rehab OT Goals Patient Stated Goal: Get pain under control OT Goal Formulation: With patient Time For Goal Achievement: 03/02/20 Potential to Achieve Goals: [place] Discharge plan remains appropriate       AM-PAC OT "6 Clicks" Daily Activity     Outcome Measure   Help from another person eating meals?: None Help from another person taking care of personal grooming?: A Little Help from another person toileting, which includes using toliet, bedpan, or urinal?: A Lot Help from  another person bathing (including washing, rinsing, drying)?: A Lot Help from another person to put on and taking off regular upper body clothing?: None Help from another person to put on and taking off regular lower body clothing?: A Lot 6 Click Score: 17    End of Session    OT Visit Diagnosis: Other abnormalities of gait and mobility (R26.89);Muscle weakness (generalized) (M62.81);Pain;History of falling (Z91.81) Pain -  Right/Left: Right Pain - part of body: Knee;Leg;Ankle and joints of foot   Activity Tolerance Patient tolerated treatment well   Patient Left in chair;with call bell/phone within reach;with chair alarm set   Nurse Communication Patient requests pain meds        Time: 4356-8616 OT Time Calculation (min): 32 min  Charges: OT General Charges $OT Visit: 1 Visit OT Treatments $Self Care/Home Management : 23-37 mins  Darleen Crocker, MS, OTR/L , CBIS ascom [phone]  02/21/20, 10:57 AM

## 2020-02-21 NOTE — Progress Notes (Signed)
Physical Therapy Treatment Patient Details Name: Rhonda Martin MRN: [id number] DOB: Jun 17, 1975 Today's Date: 02/21/2020    History of Present Illness Pt is a 44 y.o. female presenting to hospital 8/28 with confusion and R knee pain.  Pt with recent admit 8/5-8/10 for R TKA (Dr. Rudene Christians).  New imaging of R knee showing apparent fx fragment adjacent to lateral aspect of proximal tibia; subtle fx line extending through the tibial metaphysis.  Pt now admitted with sepsis secondary surgical site wound dehiscence; also noted with patellar tendon rupture.  Pt s/p R total knee revision and I&D 8/29.  PMH includes alcohol abuse, anxiety, h/o back injury, Charcot-Marie-Tooth disease, COPD, Hepatitis C, htn, thrombocytopenia.    PT Comments    Pt lying in bed upon arrival and agreeable to PT session. Performed strengthening exercises in bed before transitioning to EOB. Pt requires less time from lying in bed to sitting EOB but still requires multiple rest breaks between movements secondary to increased pain. Pt takes increase encouragement to participate, agreeable to transfer to recliner. However not able to tolerate ambulation due to pain today. Pt requires min verbal cues for sequencing of all tasks. Pt left in chair with all needs. Pt encouraged to continue leg exercises on her own time and reports she has been trying to do them in the bed. Pt continues to be greatly limited secondary to increased pain. STR remains appropriate for discharge due to patient's poor tolerance of activity.   Follow Up Recommendations  SNF;Supervision for mobility/OOB     Equipment Recommendations  None recommended by PT    Recommendations for Other Services       Precautions / Restrictions Precautions Precautions: Fall Restrictions Weight Bearing Restrictions: No    Mobility  Bed Mobility Overal bed mobility: Needs Assistance Bed Mobility: Supine to Sit Rolling: Supervision   Supine to sit: HOB  elevated;Supervision     General bed mobility comments: Pt performed supine to sit with supervision for safety. HOB raised and pt required increased time and min VCs provided. Pt requires some rest breaks secondary to increased pain.  Transfers Overall transfer level: Needs assistance Equipment used: None Transfers: Stand Pivot Transfers     Squat pivot transfers: Supervision     General transfer comment: Patient hesistant to transfer to recliner, very fearful. emotional support given and heavy encouragement. Pt eventually able to stand pivot transfer to recliner with no close standby supervision and min assist for equipment set up and safe chair placement, needed cues for clearance of elevated arm rest on recliner during transfer. Once seated in recliner, CGA for positioning in chair for safety  Ambulation/Gait             General Gait Details: Deferred d/t pt's elevated R LE pain with activity   Stairs             Wheelchair Mobility    Modified Rankin (Stroke Patients Only)       Balance Overall balance assessment: Needs assistance Sitting-balance support: Feet supported Sitting balance-Leahy Scale: Good Sitting balance - Comments: steady static sitting with at least single UE support   Standing balance support: Single extremity supported;During functional activity Standing balance-Leahy Scale: Fair Standing balance comment: requires physical assistance to balance with single UE on recliner during transfer                            Cognition Arousal/Alertness: Awake/alert Behavior During Therapy: Anxious Overall Cognitive Status:  Within Functional Limits for tasks assessed                                        Exercises Total Joint Exercises Straight Leg Raises: 10 reps;Supine;AROM;Strengthening (Some manual resistance with eccentrically and concentrically) Knee Flexion: AROM;Supine;15 reps (with manual resistance on  eccentric control) General Exercises - Upper Extremity Elbow Extension: Strengthening;10 reps;Supine    General Comments        Pertinent Vitals/Pain Pain Assessment: Faces Pain Score: 10-Worst pain ever Faces Pain Scale: Hurts whole lot Pain Location: RLE Pain Descriptors / Indicators: Burning;Discomfort;Moaning;Grimacing;Guarding Pain Intervention(s): Monitored during session    Home Living                      Prior Function            PT Goals (current goals can now be found in the care plan section) Acute Rehab PT Goals Patient Stated Goal: Get pain under control PT Goal Formulation: With patient Time For Goal Achievement: 03/02/20 Potential to Achieve Goals: Fair Progress towards PT goals: Progressing toward goals    Frequency    7X/week      PT Plan Current plan remains appropriate    Co-evaluation              AM-PAC PT "6 Clicks" Mobility   Outcome Measure  Help needed turning from your back to your side while in a flat bed without using bedrails?: A Little Help needed moving from lying on your back to sitting on the side of a flat bed without using bedrails?: A Little Help needed moving to and from a bed to a chair (including a wheelchair)?: A Little Help needed standing up from a chair using your arms (e.g., wheelchair or bedside chair)?: A Little Help needed to walk in hospital room?: Total Help needed climbing 3-5 steps with a railing? : Total 6 Click Score: 14    End of Session Equipment Utilized During Treatment: Other (comment) (None) Activity Tolerance: Patient limited by pain Patient left: in chair;with call bell/phone within reach;with chair alarm set Nurse Communication: Mobility status PT Visit Diagnosis: Unsteadiness on feet (R26.81);Other abnormalities of gait and mobility (R26.89);Muscle weakness (generalized) (M62.81);Difficulty in walking, not elsewhere classified (R26.2);Pain Pain - Right/Left: Right Pain - part of  body: Leg     Time: 1126-1204 PT Time Calculation (min) (ACUTE ONLY): 38 min  Charges:  $Therapeutic Exercise: 23-37 mins $Therapeutic Activity: 8-22 mins                       Noemi Chapel, SPT Bernita Raisin 02/21/2020, 2:21 PM

## 2020-02-22 DIAGNOSIS — R079 Chest pain, unspecified: Secondary | ICD-10-CM | POA: Diagnosis present

## 2020-02-22 DIAGNOSIS — S83101A Unspecified subluxation of right knee, initial encounter: Secondary | ICD-10-CM

## 2020-02-22 MED ORDER — TRAMADOL HCL 50 MG PO TABS
50.0000 mg | ORAL_TABLET | Freq: Four times a day (QID) | ORAL | Status: DC | PRN
  Administered 2020-02-22 – 2020-02-23 (×2): 50 mg via ORAL
  Filled 2020-02-22 (×2): qty 1

## 2020-02-22 MED ORDER — GABAPENTIN 400 MG PO CAPS
400.0000 mg | ORAL_CAPSULE | Freq: Three times a day (TID) | ORAL | Status: DC
  Administered 2020-02-22 – 2020-03-05 (×36): 400 mg via ORAL
  Filled 2020-02-22 (×36): qty 1

## 2020-02-22 NOTE — Progress Notes (Signed)
PROGRESS NOTE    Rhonda Martin  NAT:[id number] DOB: Aug 31, 1975 DOA: 02/14/2020 PCP: Volney American, PA-C   Subjective: The patient was seen and examined this morning, stable no acute distress still complaining of pain and discomfort   Issues discussed extensively the patient regarding daily complain of pain regardless hefty dose of narcotics.. Patient stating she had multiple surgeries in the past, she is not comfortable with the current amputation still having pain despite heavy narcotics  Pros and cons of narcotics was discussed with the patient in detail she is agreeable to follow-up with pain management as an outpatient with plan to take her off narcotics  I have explained to her extensively that additional narcotics not recommended.    Hospital course Rhonda Martin is a 44 y.o. Caucasian female with a known history of Charcot- Marie-Tooth disease, alcohol abuse, anxiety/depression, hepatitis C, narcotic/polysubstance abuse, liver cirrhosis, who underwent right total knee arthroplasty on 09/07/7060 that was complicated by wound dehiscence and bleeding for which she had a right total knee revision on 8/29 by Dr. Rudene Christians at which time she was apparently found to have osteomyelitis, subsequently presented  with acute onset of suspected dislocation and worsening swelling, tenderness and pain on her right knee.  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 On antibiotics   Assessment & Plan:   Active Problems:   Osteomyelitis of right lower extremity (HCC)   Failed right total knee replacement: with associated right lower extremity osteomyelitis and severe nonpurulent knee cellulitis.  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 Surgical path is pending still.  Previous knee cultures grew stap epi and candidia parapsilosis. W/ wound dehiscence, noncompliance w/ medication and alcohol/drug abuse,  ortho surg feels the infection will not resolve so pt will go for R AKA. 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 Continue on oxycodone, gabapentin, oxycontin as pt has severe pain.  Continues to complain of pain--- above medication will be titrated   Sepsis/leukocytosis -Resolved, stable  -r/o w/ only tachycardia, elevated lactic acid, right knee infection, normal WBC, RR 20 was present on admission.     HTN: Monitoring, continue home medication metoprolol, Lasix , as needed hydralazine Hypokalemia: Stable repleting accordingly GERD: continue on pantoprazole   Peripheral neuropathy:  Stable, continue home dose Neurontin  Acute on chronic pain syndrome -Currently on OxyIR 10 mg as needed every 6 hours, Ultram 50 mg p.o. every 6 hours as needed -Scheduled oxycodone 20 mg every 12 hours -Discussed extensively regarding narcotics, pros and cons of narcotics were discussed in detail Highly recommending no additional of narcotics It was explained to the patient she needs to follow with pain management with a goal of tapering off She is agreeable     Depression: Stable,. Continue on home dose of SSRI, olanzapine Cirrhosis: No signs of asterixis, encephalopathy, continue lactulose and spironolactone  Thrombocytopenia: Monitoring, resolved Transaminitis: likely secondary to cirrhosis. AST is labile.   Charcot-Marie Tooth Disease: continue w/ supportive care    DVT prophylaxis: SCDs Code Status: full  Family Communication:  Disposition Plan: Remains medically stable, pending SNF discharge, appreciate social worker assistance  (previous admission, PT/OT recs SNF but pt refused). Waiting on bed placement for SNF  Status is: Inpatient  Remains inpatient appropriate because:IV treatments appropriate due to intensity of illness or inability to take PO, stable for d/c to SNF, waiting on bed placement    Dispo: The patient is from: Home  Anticipated d/c is to: SNF              Anticipated d/c  date is: whenever a SNF bed is available               Patient currently is medically stable to d/c.     Consultants:   Ortho surgery    Procedures:   Antimicrobials:     Objective: Vitals:   02/21/20 1631 02/21/20 2348 02/22/20 0724 02/22/20 0846  BP: 116/78 138/82 136/86 120/68  Pulse: 97 (!) 105 (!) 110 (!) 110  Resp: 15 17 16    Temp: 99.3 F (37.4 C) 99 F (37.2 C) 98.2 F (36.8 C)   TempSrc:  Oral Oral   SpO2: 99% 98% 98%   Weight:      Height:        Intake/Output Summary (Last 24 hours) at 02/22/2020 1248 Last data filed at 02/22/2020 1020 Gross per 24 hour  Intake 1289.71 ml  Output 2301 ml  Net -1011.29 ml   Filed Weights   02/14/20 1722  Weight: 60.8 kg         Physical Exam:   General:  Alert, oriented, cooperative, no distress;   HEENT:  Normocephalic, PERRL, otherwise with in Normal limits   Neuro:  CNII-XII intact. , normal motor and sensation, reflexes intact   Lungs:   Clear to auscultation BL, Respirations unlabored, no wheezes / crackles  Cardio:    S1/S2, RRR, No murmure, No Rubs or Gallops   Abdomen:   Soft, non-tender, bowel sounds active all four quadrants,  no guarding or peritoneal signs.  Muscular skeletal:   Right lower extremity AKA Limited exam - in bed, able to move all 4 extremities, Normal strength,  2+ pulses,  symmetric, No pitting edema  Skin:  Dry, warm to touch, negative for any Rashes, No open wounds  Wounds: Please see nursing documentation -right lower extremity stump-surgical wound dressing in place                Data Reviewed: I have personally reviewed following labs and imaging studies  CBC: Recent Labs  Lab 02/16/20 0620 02/17/20 0946 02/18/20 0841 02/19/20 0353 02/20/20 0322  WBC 3.4* 9.9 14.2* 8.6 9.1  HGB 8.4* 9.8* 9.4* 9.8* 9.0*  HCT 26.6* 28.6* 29.1* 29.3* 27.6*  MCV 83.9 82.9 85.3 84.4 84.7  PLT 154 158 143* 159 749   Basic Metabolic Panel: Recent Labs  Lab 02/16/20 0620  02/17/20 0946 02/18/20 0841 02/19/20 0353 02/20/20 0322  NA 138 139 136 138 136  K 3.9 4.4 3.8 4.1 3.8  CL 105 107 104 103 101  CO2 24 25 24 28 27   GLUCOSE 96 210* 118* 120* 134*  BUN 8 8 7 9 10   CREATININE 0.51 0.37* 0.35* 0.43* 0.42*  CALCIUM 8.4* 8.1* 8.0* 7.9* 8.1*   GFR: Estimated Creatinine Clearance: 77.1 mL/min (A) (by C-G formula based on SCr of 0.42 mg/dL (L)). Liver Function Tests: Recent Labs  Lab 02/16/20 0620 02/17/20 0946 02/18/20 0841 02/19/20 0353 02/20/20 0322  AST 49* 41 47* 46* 43*  ALT 22 21 24 25 23   ALKPHOS 107 99 93 90 78  BILITOT 1.7* 1.3* 1.0 0.7 0.7  PROT 6.7 6.5 6.3* 6.2* 5.9*  ALBUMIN 2.6* 2.5* 2.4* 2.4* 2.3*   No results for input(s): LIPASE, AMYLASE in the last 168 hours. Recent Labs  Lab 02/16/20 0620 02/17/20 0946 02/18/20 0841 02/19/20 0353 02/20/20 0322  AMMONIA 33 18 46* 34 43*  Coagulation Profile: Recent Labs  Lab 02/16/20 0620  INR 1.4*   Cardiac Enzymes: No results for input(s): CKTOTAL, CKMB, CKMBINDEX, TROPONINI in the last 168 hours. No results for input(s): PROCALCITON, LATICACIDVEN in the last 168 hours.  Recent Results (from the past 240 hour(s))  Culture, blood (Routine x 2)     Status: None   Collection Time: 02/14/20  5:57 PM   Specimen: BLOOD  Result Value Ref Range Status   Specimen Description BLOOD BLOOD LEFT HAND  Final   Special Requests   Final    BOTTLES DRAWN AEROBIC ONLY Blood Culture results may not be optimal due to an inadequate volume of blood received in culture bottles   Culture   Final    NO GROWTH 5 DAYS Performed at [facility], [address] [place], [place] [address]    Report Status 02/19/2020 FINAL  Final  Culture, blood (Routine x 2)     Status: None   Collection Time: 02/14/20  5:57 PM   Specimen: BLOOD  Result Value Ref Range Status   Specimen Description BLOOD BLOOD RIGHT FOREARM  Final   Special Requests   Final    BOTTLES DRAWN AEROBIC AND ANAEROBIC Blood  Culture adequate volume   Culture   Final    NO GROWTH 5 DAYS Performed at [facility], [address] [place], [place] [address]    Report Status 02/19/2020 FINAL  Final  SARS Coronavirus 2 by RT PCR (hospital order, performed in [facility] hospital lab) Nasopharyngeal Nasopharyngeal Swab     Status: None   Collection Time: 02/14/20 11:58 PM   Specimen: Nasopharyngeal Swab  Result Value Ref Range Status   SARS Coronavirus 2 NEGATIVE NEGATIVE Final    Comment: (NOTE) SARS-CoV-2 target nucleic acids are NOT DETECTED.  The SARS-CoV-2 RNA is generally detectable in upper and lower respiratory specimens during the acute phase of infection. The lowest concentration of SARS-CoV-2 viral copies this assay can detect is 250 copies / mL. A negative result does not preclude SARS-CoV-2 infection and should not be used as the sole basis for treatment or other patient management decisions.  A negative result may occur with improper specimen collection / handling, submission of specimen other than nasopharyngeal swab, presence of viral mutation(s) within the areas targeted by this assay, and inadequate number of viral copies (<250 copies / mL). A negative result must be combined with clinical observations, patient history, and epidemiological information.  Fact Sheet for Patients:   StrictlyIdeas.no  Fact Sheet for Healthcare Providers: BankingDealers.co.za  This test is not yet approved or  cleared by the [place] FDA and has been authorized for detection and/or diagnosis of SARS-CoV-2 by FDA under an Emergency Use Authorization (EUA).  This EUA will remain in effect (meaning this test can be used) for the duration of the COVID-19 declaration under Section 564(b)(1) of the Act, 21 U.S.C. section 360bbb-3(b)(1), unless the authorization is terminated or revoked sooner.  Performed at [facility], [address]  [place], [place] [address]   Surgical PCR screen     Status: None   Collection Time: 02/16/20  1:37 AM   Specimen: Nasal Mucosa; Nasal Swab  Result Value Ref Range Status   MRSA, PCR NEGATIVE NEGATIVE Final   Staphylococcus aureus NEGATIVE NEGATIVE Final    Comment: (NOTE) The Xpert SA Assay (FDA approved for NASAL specimens in patients 62 years of age and older), is one component of a comprehensive surveillance program. It is  not intended to diagnose infection nor to guide or monitor treatment. Performed at [facility], [address], [place], [place] [address]          Radiology Studies: No results found.      Scheduled Meds: . Chlorhexidine Gluconate Cloth  6 each Topical Once  . dicyclomine  20 mg Oral TID AC & HS  . DULoxetine  20 mg Oral Daily  . enoxaparin (LOVENOX) injection  40 mg Subcutaneous Q24H  . ferrous sulfate  325 mg Oral Q breakfast  . folic acid  1 mg Oral Daily  . furosemide  20 mg Oral Daily  . lactulose  30 g Oral TID  . magnesium oxide  400 mg Oral Daily  . metoprolol succinate  12.5 mg Oral Daily  . multivitamin with minerals  1 tablet Oral Daily  . OLANZapine  10 mg Oral QHS  . oxyCODONE  20 mg Oral Q12H  . pantoprazole  40 mg Oral Daily  . senna  1 tablet Oral BID  . spironolactone  25 mg Oral Daily  . sucralfate  1 g Oral TID WC & HS  . thiamine  100 mg Oral Daily  . vitamin B-12  500 mcg Oral Daily   Continuous Infusions: . sodium chloride 1,000 mL (02/21/20 2041)     LOS: 8 days    Time spent: 31 mins     Deatra James, MD [facility] Pager [phone]  If 7PM-7AM, please contact night-coverage [email or web address] 02/22/2020, 12:48 PM

## 2020-02-22 NOTE — Progress Notes (Signed)
Physical Therapy Treatment Patient Details Name: Rhonda Martin MRN: [id number] DOB: 1975/11/07 Today's Date: 02/22/2020    History of Present Illness Pt is a 44 y.o. female presenting to hospital 8/28 with confusion and R knee pain.  Pt with recent admit 8/5-8/10 for R TKA (Dr. Rudene Christians).  New imaging of R knee showing apparent fx fragment adjacent to lateral aspect of proximal tibia; subtle fx line extending through the tibial metaphysis.  Pt now admitted with sepsis secondary surgical site wound dehiscence; also noted with patellar tendon rupture.  Pt s/p R total knee revision and I&D 8/29.  PMH includes alcohol abuse, anxiety, h/o back injury, Charcot-Marie-Tooth disease, COPD, Hepatitis C, htn, thrombocytopenia.    PT Comments    Pt lying in bed upon arrival and agreeable to PT session after some convincing. Pt's wrap is not on leg, but pt able to put it on herself with consent from the nurse. Pt has been touching and desensitizing leg. Pt requires less time from lying in bed to sitting EOB with no rest breaks and able to push through pain. Pt takes increase encouragement to participate, agreeable to transfer to recliner. Trying to convince her to ambulate, but pt denies due to not having a shoe to prevent her from falling. Once in recliner, patient performed strengthening exercises with increased reps and increased manual resistance. Pt requires min verbal cues for sequencing of all tasks. Pt left in chair with all needs. Pt encouraged to continue leg exercises on her own time and reports she has been trying to do them in the bed. Pt continues to be greatly limited secondary to increased pain. STR remains appropriate for discharge due to patient's poor tolerance of activity.   Follow Up Recommendations  SNF;Supervision for mobility/OOB     Equipment Recommendations  None recommended by PT    Recommendations for Other Services       Precautions / Restrictions Precautions Precautions:  Fall Precaution Booklet Issued: No Restrictions Weight Bearing Restrictions: No    Mobility  Bed Mobility Overal bed mobility: Modified Independent Bed Mobility: Supine to Sit Rolling: Supervision   Supine to sit: HOB elevated;Supervision     General bed mobility comments: Pt performed supine to sit with supervision for safety. HOB raised and pt did not take increased time during transition. Pt took a rest break once sitting EOB.  Transfers Overall transfer level: Needs assistance Equipment used: None     Stand pivot transfers: Supervision       General transfer comment: Patient not hesistant to transfer since she is more comfortable moving to the chair. Pt is standby supervision and min assist for equipment set up and safe chair placement, needed cues for clearance of elevated arm rest on recliner during transfer. Once seated in recliner, CGA for positioning in chair for safety  Ambulation/Gait             General Gait Details: Tried to convince pt to walk but pt is insistent of getting a shoe because socks are slippery and is afraid of falling   Stairs             Wheelchair Mobility    Modified Rankin (Stroke Patients Only)       Balance Overall balance assessment: Needs assistance Sitting-balance support: Feet supported Sitting balance-Leahy Scale: Good Sitting balance - Comments: steady static sitting with at least single UE support   Standing balance support: Single extremity supported;During functional activity Standing balance-Leahy Scale: Fair Standing balance comment: requires physical  assistance to balance with single UE on recliner during transfer                            Cognition Arousal/Alertness: Awake/alert Behavior During Therapy: Anxious Overall Cognitive Status: Within Functional Limits for tasks assessed                                        Exercises Total Joint Exercises Hip  ABduction/ADduction: Strengthening;15 reps;Seated General Exercises - Upper Extremity Elbow Extension: Strengthening;15 reps;Seated General Exercises - Lower Extremity Long Arc Quad: Strengthening;15 reps;Seated    General Comments        Pertinent Vitals/Pain Pain Assessment: Faces Faces Pain Scale: Hurts whole lot Pain Location: RLE Pain Descriptors / Indicators: Burning;Discomfort;Moaning;Grimacing;Guarding Pain Intervention(s): Monitored during session    Home Living                      Prior Function            PT Goals (current goals can now be found in the care plan section) Acute Rehab PT Goals Patient Stated Goal: Get pain under control PT Goal Formulation: With patient Time For Goal Achievement: 03/02/20 Potential to Achieve Goals: Fair    Frequency    7X/week      PT Plan Current plan remains appropriate    Co-evaluation              AM-PAC PT "6 Clicks" Mobility   Outcome Measure  Help needed turning from your back to your side while in a flat bed without using bedrails?: A Little Help needed moving from lying on your back to sitting on the side of a flat bed without using bedrails?: A Little Help needed moving to and from a bed to a chair (including a wheelchair)?: A Little Help needed standing up from a chair using your arms (e.g., wheelchair or bedside chair)?: A Little Help needed to walk in hospital room?: Total Help needed climbing 3-5 steps with a railing? : Total 6 Click Score: 14    End of Session Equipment Utilized During Treatment: Other (comment) (None) Activity Tolerance: Patient limited by pain;Treatment limited secondary to agitation Patient left: in chair;with call bell/phone within reach;with chair alarm set Nurse Communication: Mobility status PT Visit Diagnosis: Unsteadiness on feet (R26.81);Other abnormalities of gait and mobility (R26.89);Muscle weakness (generalized) (M62.81);Difficulty in walking, not  elsewhere classified (R26.2);Pain Pain - Right/Left: Right Pain - part of body: Leg     Time: 1340-1427 PT Time Calculation (min) (ACUTE ONLY): 47 min  Charges:                         Noemi Chapel, SPT Bernita Raisin 02/22/2020, 4:31 PM

## 2020-02-23 DIAGNOSIS — F411 Generalized anxiety disorder: Secondary | ICD-10-CM

## 2020-02-23 DIAGNOSIS — I209 Angina pectoris, unspecified: Secondary | ICD-10-CM

## 2020-02-23 DIAGNOSIS — R531 Weakness: Secondary | ICD-10-CM

## 2020-02-23 MED ORDER — OXYCODONE HCL 5 MG PO TABS
5.0000 mg | ORAL_TABLET | Freq: Four times a day (QID) | ORAL | Status: DC | PRN
  Administered 2020-02-23 – 2020-03-05 (×32): 5 mg via ORAL
  Filled 2020-02-23 (×33): qty 1

## 2020-02-23 MED ORDER — TRAMADOL HCL 50 MG PO TABS
100.0000 mg | ORAL_TABLET | Freq: Four times a day (QID) | ORAL | Status: DC | PRN
  Administered 2020-02-24 – 2020-03-04 (×8): 100 mg via ORAL
  Filled 2020-02-23 (×9): qty 2

## 2020-02-23 NOTE — Progress Notes (Signed)
Pt observed with dressing to R stump half off and she was using her dirty fingers to pick at area surrounding staples.  She said she still felt tape to the site but there was not any that this nurse observed.  Nurse encouraged pt to stop touching healing incision with her bare dirty hands and finger nails.  She stated she couldn't help it.  Pt asked nurse to undress incision completely to let it "air out".  Nurse stated it needed to be covered per Drs orders, she began to refuse but then agreed to change the dressing out completely.  At first she wanted to do it and this nurse was ok with that as long as she gelled her hands and wore gloves, she did this but then ended up allowing this nurse to fully redress.  Stump was cleansed with normal saline, redness is observed at the medial end of the incision, no blood or drainage is observed.  Incision was dried, telfa pad was applied and it was rewrapped with kerlix and taped.  Pt tolerated well.

## 2020-02-23 NOTE — Progress Notes (Signed)
Physical Therapy Treatment Patient Details Name: Rhonda Martin MRN: [id number] DOB: 09/05/1975 Today's Date: 02/23/2020    History of Present Illness Pt is a 44 y.o. female presenting to hospital 8/28 with confusion and R knee pain.  Pt with recent admit 8/5-8/10 for R TKA (Dr. Rudene Christians).  New imaging of R knee showing apparent fx fragment adjacent to lateral aspect of proximal tibia; subtle fx line extending through the tibial metaphysis.  Pt now admitted with sepsis secondary surgical site wound dehiscence; also noted with patellar tendon rupture.  Pt s/p R total knee revision and I&D 8/29.  PMH includes alcohol abuse, anxiety, h/o back injury, Charcot-Marie-Tooth disease, COPD, Hepatitis C, htn, thrombocytopenia.    PT Comments    Pt lying in bed upon arrival and is in a lot of pain, so treatment broken up in 2 sessions.Pt's wrap is not on leg and was touching it with her bare hands. Pt was given hand sanitizer so she can touch her incision with clean hands. While setting up the chair so she can transfer over to it, the RN came in and wrapped her leg. Pt was screaming due to the pain and the RN noted to come back later because after pt receives pain medication. Came back later and pt still refuses to get out of bed and into chair. She notes being wet so performed bed mobility rolling to change seats with help of RNA. Pt requires VCs for proper hand placement during rolling. Pt left in bed with all needs.Pt encouraged to continue leg exercises on her own time.Pt continues to be greatly limited secondary to increased pain. STR remains appropriate for discharge due to patient's poor tolerance of activity.   Follow Up Recommendations  SNF;Supervision for mobility/OOB     Equipment Recommendations  None recommended by PT    Recommendations for Other Services       Precautions / Restrictions Precautions Precautions: Fall Precaution Booklet Issued: No Restrictions Weight Bearing Restrictions:  No    Mobility  Bed Mobility Overal bed mobility: Modified Independent Bed Mobility: Rolling Rolling: Supervision         General bed mobility comments: Pt rolled L & R to change sheets, VCs needed for proper hand placement  Transfers                 General transfer comment: Did not transfer due to patient's pain level and refusal  Ambulation/Gait             General Gait Details: Pt was adamant to stay in bed so did not transfer and walk   Stairs             Wheelchair Mobility    Modified Rankin (Stroke Patients Only)       Balance       Sitting balance - Comments: Pt did not sit EOB this session due to pain level       Standing balance comment: Patient did not stand today due to pain level                            Cognition Arousal/Alertness: Awake/alert Behavior During Therapy: Anxious Overall Cognitive Status: Within Functional Limits for tasks assessed                                 General Comments: Pt has increased pain today and does not  want to get out of bed      Exercises Other Exercises Other Exercises: Pt educated on the importance to stay mobile and keep moving    General Comments        Pertinent Vitals/Pain Pain Assessment: Faces Faces Pain Scale: Hurts whole lot Pain Location: RLE Pain Descriptors / Indicators: Burning;Discomfort;Moaning;Grimacing;Guarding Pain Intervention(s): Monitored during session;Premedicated before session    Home Living                      Prior Function            PT Goals (current goals can now be found in the care plan section) Acute Rehab PT Goals Patient Stated Goal: Get pain under control PT Goal Formulation: With patient Time For Goal Achievement: 03/02/20 Potential to Achieve Goals: Fair    Frequency    7X/week      PT Plan Current plan remains appropriate    Co-evaluation              AM-PAC PT "6 Clicks"  Mobility   Outcome Measure  Help needed turning from your back to your side while in a flat bed without using bedrails?: A Little Help needed moving from lying on your back to sitting on the side of a flat bed without using bedrails?: A Little Help needed moving to and from a bed to a chair (including a wheelchair)?: A Little Help needed standing up from a chair using your arms (e.g., wheelchair or bedside chair)?: A Little Help needed to walk in hospital room?: Total Help needed climbing 3-5 steps with a railing? : Total 6 Click Score: 14    End of Session Equipment Utilized During Treatment: Other (comment) (None) Activity Tolerance: Patient limited by pain;Patient limited by fatigue Patient left: in bed;with call bell/phone within reach;with bed alarm set Nurse Communication: Mobility status PT Visit Diagnosis: Unsteadiness on feet (R26.81);Other abnormalities of gait and mobility (R26.89);Muscle weakness (generalized) (M62.81);Difficulty in walking, not elsewhere classified (R26.2);Pain Pain - Right/Left: Right Pain - part of body: Leg     Time: 1337 [phone] (1546) PT Time Calculation (min) (ACUTE ONLY): 109 min  Charges:                         Noemi Chapel, SPT Bernita Raisin 02/23/2020, 4:51 PM

## 2020-02-23 NOTE — Progress Notes (Signed)
PROGRESS NOTE    Rhonda Martin  RXV:[id number] DOB: [birth year] DOA: 02/14/2020 PCP: Volney American, PA-C   Subjective: Patient was seen and examined this morning, stable as usual still complaining of pain and discomfort  Long discussion was held regarding pain management Addition of scheduled Neurontin, Ultram, reduction of as needed [facility] course Rhonda Martin is a 44 y.o. Caucasian female with a known history of Charcot- Marie-Tooth disease, alcohol abuse, anxiety/depression, hepatitis C, narcotic/polysubstance abuse, liver cirrhosis, who underwent right total knee arthroplasty on 5/0/9326 that was complicated by wound dehiscence and bleeding for which she had a right total knee revision on 8/29 by Dr. Rudene Christians at which time she was apparently found to have osteomyelitis, subsequently presented  with acute onset of suspected dislocation and worsening swelling, tenderness and pain on her right knee.  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 On antibiotics   Assessment & Plan:   Active Problems:   Weakness   Generalized anxiety disorder   HTN (hypertension)   Osteomyelitis of right lower extremity (HCC)   Ischemic chest pain (HCC)   Failed right total knee replacement: with associated right lower extremity osteomyelitis and severe nonpurulent knee cellulitis.  -Remained stable, afebrile normotensive -Status post AKA 02/16/2020 -D/c IV vanco, zosyn & fluconazole as wound is clean as per vascular surg. Surgical path is pending still.  Previous knee cultures grew stap epi and candidia parapsilosis. W/ wound dehiscence, noncompliance w/ medication and alcohol/drug abuse, ortho surg feels the infection will not resolve so pt will go for R AKA. 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 Continue on oxycodone, gabapentin, oxycontin as pt has severe pain.  Continues to complain of pain--- above medication will be titrated   Sepsis/leukocytosis -Resolved  -r/o w/ only tachycardia, elevated lactic acid, right  knee infection, normal WBC, RR 20 was present on admission.     HTN: Monitoring, continue home medication metoprolol, Lasix , as needed hydralazine Hypokalemia: Stable repleting accordingly GERD: continue on pantoprazole   Peripheral neuropathy:  Stable, continue home dose Neurontin  Acute on chronic pain syndrome -Currently on OxyIR 10 mg as needed every 6 hours, Ultram 50 mg p.o. every 6 hours as needed -Scheduled oxycodone 20 mg every 12 hours -Once again extensive discussion regarding pain management was held with patient she expressed understanding seem to be cooperative  -Adjustment reducing her OxyIR from 10 to 5 mg every 6 hours as needed, continue Ultram 50 mg every 6 hours as needed, continue scheduled oxycodone 20 mg every 12 hours, Neurontin 400 mg p.o. 3 times daily     Depression: Stable,. Continue on home dose of SSRI, olanzapine Cirrhosis: No signs of asterixis, encephalopathy, continue lactulose and spironolactone  Thrombocytopenia: Monitoring, resolved Transaminitis: likely secondary to cirrhosis. AST is labile.   Charcot-Marie Tooth Disease: continue w/ supportive care    DVT prophylaxis: SCDs Code Status: full  Family Communication:  Disposition Plan: Remains medically stable, pending SNF discharge, appreciate social worker assistance  (previous admission, PT/OT recs SNF but pt refused). Waiting on bed placement for SNF Difficult to discharge at SNF still does not have any accepted Patient is now agreeable to go to SNF    Status is: Inpatient  Remains inpatient appropriate because:IV treatments appropriate due to intensity of illness or inability to take PO, stable for d/c to SNF, waiting on bed placement    Dispo: The patient is from: Home              Anticipated d/c is to:  SNF              Anticipated d/c date is: whenever a SNF bed is available               Patient currently is medically stable to d/c.     Consultants:   Ortho surgery     Procedures:   Antimicrobials:     Objective: Vitals:   02/22/20 1545 02/22/20 2000 02/23/20 0025 02/23/20 0735  BP: 131/68 139/71 127/69 125/66  Pulse: (!) 110 (!) 110 (!) 106 98  Resp: 17 19 17 16   Temp: 99.1 F (37.3 C) 99 F (37.2 C) 97.9 F (36.6 C) 98.8 F (37.1 C)  TempSrc: Oral Oral Oral Oral  SpO2: 97% 98% 98% 96%  Weight:      Height:        Intake/Output Summary (Last 24 hours) at 02/23/2020 1223 Last data filed at 02/23/2020 0656 Gross per 24 hour  Intake 280.64 ml  Output 450 ml  Net -169.36 ml   Filed Weights   02/14/20 1722  Weight: 60.8 kg        Physical Exam:   General:  Alert, oriented, cooperative, no distress;   HEENT:  Normocephalic, PERRL, otherwise with in Normal limits   Neuro:  CNII-XII intact. , normal motor and sensation, reflexes intact   Lungs:   Clear to auscultation BL, Respirations unlabored, no wheezes / crackles  Cardio:    S1/S2, RRR, No murmure, No Rubs or Gallops   Abdomen:   Soft, non-tender, bowel sounds active all four quadrants,  no guarding or peritoneal signs.  Muscular skeletal:   Right AKA Limited exam - in bed, able to move all 4 extremities, Normal strength,  2+ pulses,  symmetric, No pitting edema  Skin:  Dry, warm to touch, negative for any Rashes, right AKA-stump dressing in place  Wounds: Please see nursing documentation   -right lower extremity stump-surgical wound dressing in place                Data Reviewed: I have personally reviewed following labs and imaging studies  CBC: Recent Labs  Lab 02/17/20 0946 02/18/20 0841 02/19/20 0353 02/20/20 0322  WBC 9.9 14.2* 8.6 9.1  HGB 9.8* 9.4* 9.8* 9.0*  HCT 28.6* 29.1* 29.3* 27.6*  MCV 82.9 85.3 84.4 84.7  PLT 158 143* 159 416   Basic Metabolic Panel: Recent Labs  Lab 02/17/20 0946 02/18/20 0841 02/19/20 0353 02/20/20 0322  NA 139 136 138 136  K 4.4 3.8 4.1 3.8  CL 107 104 103 101  CO2 25 24 28 27   GLUCOSE 210* 118* 120* 134*   BUN 8 7 9 10   CREATININE 0.37* 0.35* 0.43* 0.42*  CALCIUM 8.1* 8.0* 7.9* 8.1*   GFR: Estimated Creatinine Clearance: 77.1 mL/min (A) (by C-G formula based on SCr of 0.42 mg/dL (L)). Liver Function Tests: Recent Labs  Lab 02/17/20 0946 02/18/20 0841 02/19/20 0353 02/20/20 0322  AST 41 47* 46* 43*  ALT 21 24 25 23   ALKPHOS 99 93 90 78  BILITOT 1.3* 1.0 0.7 0.7  PROT 6.5 6.3* 6.2* 5.9*  ALBUMIN 2.5* 2.4* 2.4* 2.3*   No results for input(s): LIPASE, AMYLASE in the last 168 hours. Recent Labs  Lab 02/17/20 0946 02/18/20 0841 02/19/20 0353 02/20/20 0322  AMMONIA 18 46* 34 43*   Coagulation Profile: No results for input(s): INR, PROTIME in the last 168 hours. Cardiac Enzymes: No results for input(s): CKTOTAL, CKMB, CKMBINDEX, TROPONINI in the last  168 hours. No results for input(s): PROCALCITON, LATICACIDVEN in the last 168 hours.  Recent Results (from the past 240 hour(s))  Culture, blood (Routine x 2)     Status: None   Collection Time: 02/14/20  5:57 PM   Specimen: BLOOD  Result Value Ref Range Status   Specimen Description BLOOD BLOOD LEFT HAND  Final   Special Requests   Final    BOTTLES DRAWN AEROBIC ONLY Blood Culture results may not be optimal due to an inadequate volume of blood received in culture bottles   Culture   Final    NO GROWTH 5 DAYS Performed at [facility], [address], [place], [place] [address]    Report Status 02/19/2020 FINAL  Final  Culture, blood (Routine x 2)     Status: None   Collection Time: 02/14/20  5:57 PM   Specimen: BLOOD  Result Value Ref Range Status   Specimen Description BLOOD BLOOD RIGHT FOREARM  Final   Special Requests   Final    BOTTLES DRAWN AEROBIC AND ANAEROBIC Blood Culture adequate volume   Culture   Final    NO GROWTH 5 DAYS Performed at [facility], [address], [place], [place] [address]    Report Status 02/19/2020 FINAL  Final  SARS Coronavirus 2 by RT PCR (hospital order, performed  in [facility] hospital lab) Nasopharyngeal Nasopharyngeal Swab     Status: None   Collection Time: 02/14/20 11:58 PM   Specimen: Nasopharyngeal Swab  Result Value Ref Range Status   SARS Coronavirus 2 NEGATIVE NEGATIVE Final    Comment: (NOTE) SARS-CoV-2 target nucleic acids are NOT DETECTED.  The SARS-CoV-2 RNA is generally detectable in upper and lower respiratory specimens during the acute phase of infection. The lowest concentration of SARS-CoV-2 viral copies this assay can detect is 250 copies / mL. A negative result does not preclude SARS-CoV-2 infection and should not be used as the sole basis for treatment or other patient management decisions.  A negative result may occur with improper specimen collection / handling, submission of specimen other than nasopharyngeal swab, presence of viral mutation(s) within the areas targeted by this assay, and inadequate number of viral copies (<250 copies / mL). A negative result must be combined with clinical observations, patient history, and epidemiological information.  Fact Sheet for Patients:   StrictlyIdeas.no  Fact Sheet for Healthcare Providers: BankingDealers.co.za  This test is not yet approved or  cleared by the [place] FDA and has been authorized for detection and/or diagnosis of SARS-CoV-2 by FDA under an Emergency Use Authorization (EUA).  This EUA will remain in effect (meaning this test can be used) for the duration of the COVID-19 declaration under Section 564(b)(1) of the Act, 21 U.S.C. section 360bbb-3(b)(1), unless the authorization is terminated or revoked sooner.  Performed at [facility], [address], [place], [place] [address]   Surgical PCR screen     Status: None   Collection Time: 02/16/20  1:37 AM   Specimen: Nasal Mucosa; Nasal Swab  Result Value Ref Range Status   MRSA, PCR NEGATIVE NEGATIVE Final   Staphylococcus aureus NEGATIVE  NEGATIVE Final    Comment: (NOTE) The Xpert SA Assay (FDA approved for NASAL specimens in patients 34 years of age and older), is one component of a comprehensive surveillance program. It is not intended to diagnose infection nor to guide or monitor treatment. Performed at [facility], [address] [place],  [address]  Radiology Studies: No results found.      Scheduled Meds: . Chlorhexidine Gluconate Cloth  6 each Topical Once  . dicyclomine  20 mg Oral TID AC & HS  . DULoxetine  20 mg Oral Daily  . enoxaparin (LOVENOX) injection  40 mg Subcutaneous Q24H  . ferrous sulfate  325 mg Oral Q breakfast  . folic acid  1 mg Oral Daily  . furosemide  20 mg Oral Daily  . gabapentin  400 mg Oral TID  . lactulose  30 g Oral TID  . magnesium oxide  400 mg Oral Daily  . metoprolol succinate  12.5 mg Oral Daily  . multivitamin with minerals  1 tablet Oral Daily  . OLANZapine  10 mg Oral QHS  . oxyCODONE  20 mg Oral Q12H  . pantoprazole  40 mg Oral Daily  . senna  1 tablet Oral BID  . spironolactone  25 mg Oral Daily  . sucralfate  1 g Oral TID WC & HS  . thiamine  100 mg Oral Daily  . vitamin B-12  500 mcg Oral Daily   Continuous Infusions: . sodium chloride Stopped (02/22/20 2210)     LOS: 9 days    Time spent: 31 mins     Deatra James, MD [facility] Pager [phone]  If 7PM-7AM, please contact night-coverage [email or web address] 02/23/2020, 12:23 PM

## 2020-02-24 MED ORDER — OXYCODONE HCL ER 15 MG PO T12A
15.0000 mg | EXTENDED_RELEASE_TABLET | Freq: Two times a day (BID) | ORAL | Status: DC
  Administered 2020-02-24 – 2020-02-25 (×2): 15 mg via ORAL
  Filled 2020-02-24 (×2): qty 1

## 2020-02-24 NOTE — Progress Notes (Signed)
PROGRESS NOTE    Rhonda Martin  JYN:[id number] DOB: 06/22/1975 DOA: 02/14/2020 PCP: Volney American, PA-C   Subjective:  The patient was seen and examined this morning, stable surprisingly not complaining of any pain No issues overnight     Hospital course Rhonda Martin is a 44 y.o. Caucasian female with a known history of Charcot- Marie-Tooth disease, alcohol abuse, anxiety/depression, hepatitis C, narcotic/polysubstance abuse, liver cirrhosis, who underwent right total knee arthroplasty on 01/12/5783 that was complicated by wound dehiscence and bleeding for which she had a right total knee revision on 8/29 by Dr. Rudene Christians at which time she was apparently found to have osteomyelitis, subsequently presented  with acute onset of suspected dislocation and worsening swelling, tenderness and pain on her right knee.  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 On antibiotics   Assessment & Plan:   Active Problems:   Weakness   Generalized anxiety disorder   HTN (hypertension)   Osteomyelitis of right lower extremity (HCC)   Ischemic chest pain (HCC)   Failed right total knee replacement: with associated right lower extremity osteomyelitis and severe nonpurulent knee cellulitis.  -Hemodynamically stable -Status post AKA 02/16/2020 -D/c IV vanco, zosyn & fluconazole as wound is clean as per vascular surg. Surgical path is pending still.  Previous knee cultures grew stap epi and candidia parapsilosis. W/ wound dehiscence, noncompliance w/ medication and alcohol/drug abuse, ortho surg feels the infection will not resolve so pt will go for R AKA. 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 Continue on oxycodone, gabapentin, oxycontin as pt has severe pain.  Continues to complain of pain--- above medication will be titrated   Sepsis/leukocytosis -Resolved  -r/o w/ only tachycardia, elevated lactic acid, right knee infection, normal WBC, RR 20 was present on admission.     HTN: Monitoring, continue home medication metoprolol,  Lasix , as needed hydralazine Hypokalemia: Stable repleting accordingly GERD: continue on pantoprazole   Peripheral neuropathy:  Stable, continue home dose Neurontin  Acute on chronic pain syndrome We will make no changes to medications today -Currently on OxyIR 10 mg as needed every 6 hours, Ultram 50 mg p.o. every 6 hours as needed -Scheduled oxycodone 20 mg every 12 hours -Once again extensive discussion regarding pain management was held with patient she expressed understanding seem to be cooperative  -Adjustment reducing her OxyIR from 10 to 5 mg every 6 hours as needed, continue Ultram 50 mg every 6 hours as needed, continue scheduled oxycodone 20 mg every 12 hours, Neurontin 400 mg p.o. 3 times daily     Depression / anxiety  -stable continue on home dose of SSRI, olanzapine Cirrhosis: Stable No signs of asterixis, encephalopathy, continue lactulose and spironolactone  Thrombocytopenia: Monitoring, resolved Transaminitis: likely secondary to cirrhosis. AST is labile.   Charcot-Marie Tooth Disease: continue w/ supportive care    DVT prophylaxis: SCDs Code Status: full  Family Communication:  Disposition Plan: Remains medically stable, pending SNF discharge, appreciate social worker assistance Pending discharge to SNF no approval yet  (previous admission, PT/OT recs SNF but pt refused). Waiting on bed placement for SNF Difficult to discharge at SNF still does not have any accepted Patient is now agreeable to go to SNF    Status is: Inpatient  Remains inpatient appropriate because:IV treatments appropriate due to intensity of illness or inability to take PO, stable for d/c to SNF, waiting on bed placement    Dispo: The patient is from: Home  Anticipated d/c is to: SNF              Anticipated d/c date is: whenever a SNF bed is available               Patient currently is medically stable to d/c.     Consultants:   Ortho surgery    Procedures:     Antimicrobials:     Objective: Vitals:   02/24/20 0218 02/24/20 0415 02/24/20 0815 02/24/20 1217  BP: 132/75 (!) 147/73 122/61 (!) 145/77  Pulse: (!) 118 (!) 121 (!) 108 (!) 113  Resp: 18 16 18 16   Temp: 99 F (37.2 C) 98.6 F (37 C) 98.4 F (36.9 C) 98.5 F (36.9 C)  TempSrc: Oral Oral Oral Oral  SpO2: 97% 96% 96% 98%  Weight:      Height:        Intake/Output Summary (Last 24 hours) at 02/24/2020 1301 Last data filed at 02/24/2020 0223 Gross per 24 hour  Intake 480 ml  Output 700 ml  Net -220 ml   Filed Weights   02/14/20 1722  Weight: 60.8 kg    Physical Exam:   General:  Alert, oriented, cooperative, no distress;   HEENT:  Normocephalic, PERRL, otherwise with in Normal limits   Neuro:  CNII-XII intact. , normal motor and sensation, reflexes intact   Lungs:   Clear to auscultation BL, Respirations unlabored, no wheezes / crackles  Cardio:    S1/S2, RRR, No murmure, No Rubs or Gallops   Abdomen:   Soft, non-tender, bowel sounds active all four quadrants,  no guarding or peritoneal signs.  Muscular skeletal:   Right AKA, stump wound Limited exam - in bed, able to move all 4 extremities, Normal strength,  2+ pulses,  symmetric, No pitting edema  Skin:  Dry, warm to touch, negative for any Rashes, No open wounds  Wounds: Please see nursing documentation   -right lower extremity stump-surgical wound dressing in place                Data Reviewed: I have personally reviewed following labs and imaging studies  CBC: Recent Labs  Lab 02/18/20 0841 02/19/20 0353 02/20/20 0322  WBC 14.2* 8.6 9.1  HGB 9.4* 9.8* 9.0*  HCT 29.1* 29.3* 27.6*  MCV 85.3 84.4 84.7  PLT 143* 159 496   Basic Metabolic Panel: Recent Labs  Lab 02/18/20 0841 02/19/20 0353 02/20/20 0322  NA 136 138 136  K 3.8 4.1 3.8  CL 104 103 101  CO2 24 28 27   GLUCOSE 118* 120* 134*  BUN 7 9 10   CREATININE 0.35* 0.43* 0.42*  CALCIUM 8.0* 7.9* 8.1*   GFR: Estimated Creatinine  Clearance: 77.1 mL/min (A) (by C-G formula based on SCr of 0.42 mg/dL (L)). Liver Function Tests: Recent Labs  Lab 02/18/20 0841 02/19/20 0353 02/20/20 0322  AST 47* 46* 43*  ALT 24 25 23   ALKPHOS 93 90 78  BILITOT 1.0 0.7 0.7  PROT 6.3* 6.2* 5.9*  ALBUMIN 2.4* 2.4* 2.3*   No results for input(s): LIPASE, AMYLASE in the last 168 hours. Recent Labs  Lab 02/18/20 0841 02/19/20 0353 02/20/20 0322  AMMONIA 46* 34 43*   Coagulation Profile: No results for input(s): INR, PROTIME in the last 168 hours. Cardiac Enzymes: No results for input(s): CKTOTAL, CKMB, CKMBINDEX, TROPONINI in the last 168 hours. No results for input(s): PROCALCITON, LATICACIDVEN in the last 168 hours.  Recent Results (from the past 240 hour(s))  Culture, blood (  Routine x 2)     Status: None   Collection Time: 02/14/20  5:57 PM   Specimen: BLOOD  Result Value Ref Range Status   Specimen Description BLOOD BLOOD LEFT HAND  Final   Special Requests   Final    BOTTLES DRAWN AEROBIC ONLY Blood Culture results may not be optimal due to an inadequate volume of blood received in culture bottles   Culture   Final    NO GROWTH 5 DAYS Performed at [facility], [address] [place], [place] [address]    Report Status 02/19/2020 FINAL  Final  Culture, blood (Routine x 2)     Status: None   Collection Time: 02/14/20  5:57 PM   Specimen: BLOOD  Result Value Ref Range Status   Specimen Description BLOOD BLOOD RIGHT FOREARM  Final   Special Requests   Final    BOTTLES DRAWN AEROBIC AND ANAEROBIC Blood Culture adequate volume   Culture   Final    NO GROWTH 5 DAYS Performed at [facility], [address], [place], [place] [address]    Report Status 02/19/2020 FINAL  Final  SARS Coronavirus 2 by RT PCR (hospital order, performed in [facility] hospital lab) Nasopharyngeal Nasopharyngeal Swab     Status: None   Collection Time: 02/14/20 11:58 PM   Specimen: Nasopharyngeal Swab  Result Value  Ref Range Status   SARS Coronavirus 2 NEGATIVE NEGATIVE Final    Comment: (NOTE) SARS-CoV-2 target nucleic acids are NOT DETECTED.  The SARS-CoV-2 RNA is generally detectable in upper and lower respiratory specimens during the acute phase of infection. The lowest concentration of SARS-CoV-2 viral copies this assay can detect is 250 copies / mL. A negative result does not preclude SARS-CoV-2 infection and should not be used as the sole basis for treatment or other patient management decisions.  A negative result may occur with improper specimen collection / handling, submission of specimen other than nasopharyngeal swab, presence of viral mutation(s) within the areas targeted by this assay, and inadequate number of viral copies (<250 copies / mL). A negative result must be combined with clinical observations, patient history, and epidemiological information.  Fact Sheet for Patients:   StrictlyIdeas.no  Fact Sheet for Healthcare Providers: BankingDealers.co.za  This test is not yet approved or  cleared by the [place] FDA and has been authorized for detection and/or diagnosis of SARS-CoV-2 by FDA under an Emergency Use Authorization (EUA).  This EUA will remain in effect (meaning this test can be used) for the duration of the COVID-19 declaration under Section 564(b)(1) of the Act, 21 U.S.C. section 360bbb-3(b)(1), unless the authorization is terminated or revoked sooner.  Performed at [facility], [address], [place],  [address]   Surgical PCR screen     Status: None   Collection Time: 02/16/20  1:37 AM   Specimen: Nasal Mucosa; Nasal Swab  Result Value Ref Range Status   MRSA, PCR NEGATIVE NEGATIVE Final   Staphylococcus aureus NEGATIVE NEGATIVE Final    Comment: (NOTE) The Xpert SA Assay (FDA approved for NASAL specimens in patients 56 years of age and older), is one component of a  comprehensive surveillance program. It is not intended to diagnose infection nor to guide or monitor treatment. Performed at [facility], [address], [place],  [address]          Radiology Studies: No results found.      Scheduled Meds: . Chlorhexidine Gluconate Cloth  6  each Topical Once  . dicyclomine  20 mg Oral TID AC & HS  . DULoxetine  20 mg Oral Daily  . enoxaparin (LOVENOX) injection  40 mg Subcutaneous Q24H  . ferrous sulfate  325 mg Oral Q breakfast  . folic acid  1 mg Oral Daily  . furosemide  20 mg Oral Daily  . gabapentin  400 mg Oral TID  . lactulose  30 g Oral TID  . magnesium oxide  400 mg Oral Daily  . metoprolol succinate  12.5 mg Oral Daily  . multivitamin with minerals  1 tablet Oral Daily  . OLANZapine  10 mg Oral QHS  . oxyCODONE  20 mg Oral Q12H  . pantoprazole  40 mg Oral Daily  . senna  1 tablet Oral BID  . spironolactone  25 mg Oral Daily  . sucralfate  1 g Oral TID WC & HS  . thiamine  100 mg Oral Daily  . vitamin B-12  500 mcg Oral Daily   Continuous Infusions: . sodium chloride Stopped (02/22/20 2210)     LOS: 10 days    Time spent: 31 mins     Deatra James, MD [facility] Pager [phone]  If 7PM-7AM, please contact night-coverage [email or web address] 02/24/2020, 1:01 PM

## 2020-02-24 NOTE — Progress Notes (Signed)
Received report from [place], Therapist, sports. Assuming care of patient at this time

## 2020-02-24 NOTE — Progress Notes (Signed)
   02/24/20 0004  Assess: MEWS Score  Temp 98.6 F (37 C)  BP (!) 142/67  Pulse Rate (!) 113  Resp 18  SpO2 97 %  O2 Device Room Air  Assess: MEWS Score  MEWS Temp 0  MEWS Systolic 0  MEWS Pulse 2  MEWS RR 0  MEWS LOC 0  MEWS Score 2  MEWS Score Color Yellow  Assess: if the MEWS score is Yellow or Red  Were vital signs taken at a resting state? Yes  Focused Assessment No change from prior assessment  Early Detection of Sepsis Score *See Row Information* Low  MEWS guidelines implemented *See Row Information* Yes  Treat  MEWS Interventions Other (Comment) (assessed patient)  Pain Scale 0-10  Pain Score 6  Pain Location Other (Comment) (amputation site (right leg))  Pain Orientation Right  Pain Descriptors / Indicators Shooting  Pain Frequency Intermittent  Pain Onset Gradual  Patients Stated Pain Goal 0  Pain Intervention(s) Rest;Relaxation  Multiple Pain Sites No  Breathing 0  Negative Vocalization 0  Facial Expression 0  Body Language 0  Consolability 0  PAINAD Score 0  Facial Expression 0  Body Movements 0  Muscle Tension 0  Take Vital Signs  Increase Vital Sign Frequency  Yellow: Q 2hr X 2 then Q 4hr X 2, if remains yellow, continue Q 4hrs  Escalate  MEWS: Escalate Yellow: discuss with charge nurse/RN and consider discussing with provider and RRT  Notify: Charge Nurse/RN  Name of Charge Nurse/RN Notified Linnise, RN  Date Charge Nurse/RN Notified 02/24/20  Time Charge Nurse/RN Notified 0005   Patient's MEWS fired for a heart rate of 113. I went in and assessed the patient who was asymptomatic without any changes. Informed Charge nurse and NP of patient's heart rate.

## 2020-02-24 NOTE — Progress Notes (Signed)
Physical Therapy Treatment Patient Details Name: Rhonda Martin MRN: [id number] DOB: 05-26-1976 Today's Date: 02/24/2020    History of Present Illness Pt is a 44 y.o. female presenting to hospital 8/28 with confusion and R knee pain.  Pt with recent admit 8/5-8/10 for R TKA (Dr. Rudene Christians).  New imaging of R knee showing apparent fx fragment adjacent to lateral aspect of proximal tibia; subtle fx line extending through the tibial metaphysis.  Pt now admitted with sepsis secondary surgical site wound dehiscence; also noted with patellar tendon rupture.  Pt s/p R total knee revision and I&D 8/29.  PMH includes alcohol abuse, anxiety, h/o back injury, Charcot-Marie-Tooth disease, COPD, Hepatitis C, htn, thrombocytopenia.    PT Comments    Pt was sitting up in bed upon arriving. She greets therapist, " Hey buddy, I've been waiting on you to come see me." She is A + O and premedicated for session. Endorses pain but pain did not limit session progression. Requested to have get OOB to have BM. She was able to exit L side of bed with supervision. Stand and "hop" to [facility] prior to standing and ambulating ~ 25 ft in room. Vcs throughout session to slow down for improved safety. Lengthy discussion about importance of performing there ex and wrapping limb for proper healing, infection prevention, and shaping limb for prosthetic placement in future. She had successful BM and RN notified. Pt overall is progressing well. Is willing to go to SNF however CM/SW having difficulty with placement. If pt continues to progress as she did today. Will be able to progress to home with HHPT to follow. However currently, pt is SNF appropriate to address deficits and improve safe functional mobility. At conclusion of session, pt was seated in recliner,chair alarm set, call bell in reach, and RN aware of pt's abilities and request for pain medication.   Follow Up Recommendations  SNF;Supervision for mobility/OOB     Equipment  Recommendations  None recommended by PT    Recommendations for Other Services       Precautions / Restrictions Precautions Precautions: Fall Precaution Booklet Issued: No Precaution Comments: mod fall Restrictions Weight Bearing Restrictions: No RLE Weight Bearing: Non weight bearing    Mobility  Bed Mobility Overal bed mobility: Modified Independent Bed Mobility: Supine to Sit Rolling: Supervision   Supine to sit: Supervision     General bed mobility comments: Pt did not require physical assistance to exit bed. vcs for improved technique and safety. needed cues to slow down throughout session  Transfers Overall transfer level: Needs assistance Equipment used: Rolling walker (2 wheeled) Transfers: Sit to/from Stand Sit to Stand: Min guard         General transfer comment: Pt was able to STS 3 x from EOB and 2 x from recliner during session. CGA for safety with vcs for technique, hand placement, and improved fwd wt shift prior to standing.  Ambulation/Gait Ambulation/Gait assistance: Min guard Gait Distance (Feet): 25 Feet Assistive device: Rolling walker (2 wheeled) Gait Pattern/deviations:  (" hop to") Gait velocity: impulsive- gait speed was WNL for amputee   General Gait Details: pt was able to stand and ambulate 2 laps in room with CGA only. no difficulty with hopping. unwilling to venture into hallway but does require constant vcs to slow down. Pt overall tolerated session well.        Balance Overall balance assessment: Needs assistance Sitting-balance support: Feet supported Sitting balance-Leahy Scale: Good Sitting balance - Comments: no LOB or unsteadiness  in sitting. Even able to lateral lean L/R to donn pants in sitting   Standing balance support: Bilateral upper extremity supported;During functional activity Standing balance-Leahy Scale: Good Standing balance comment: no LOB in standing but is reliant on RW for support. pt's poor safety awarenss  and impulsivity make her fall risk         Cognition Arousal/Alertness: Awake/alert Behavior During Therapy: Impulsive;WFL for tasks assessed/performed Overall Cognitive Status: Within Functional Limits for tasks assessed      General Comments: Pt is A and O but slightly imopulsive. Did have pants on but did not have R limb wrapped. Cooperative and pleasant. " I've been waiting to see you. where have you been? I want to try to have a BM."       Exercises Other Exercises Other Exercises: pt was educated on importance of wrapping limb for proper healing and shaping of limb. states understanding and is willing to allow RN to wrap post session        Pertinent Vitals/Pain Pain Assessment: 0-10 Pain Score: 7  Faces Pain Scale: Hurts even more Pain Location: RLE Pain Descriptors / Indicators: Burning;Discomfort;Moaning;Grimacing;Guarding Pain Intervention(s): Limited activity within patient's tolerance;Monitored during session;Premedicated before session;Repositioned       Prior Function            PT Goals (current goals can now be found in the care plan section) Acute Rehab PT Goals Patient Stated Goal: Move better so I can get a prosthetic. Progress towards PT goals: Progressing toward goals    Frequency    7X/week      PT Plan Current plan remains appropriate    Co-evaluation     PT goals addressed during session: Mobility/safety with mobility;Strengthening/ROM;Proper use of DME        AM-PAC PT "6 Clicks" Mobility   Outcome Measure  Help needed turning from your back to your side while in a flat bed without using bedrails?: A Little Help needed moving from lying on your back to sitting on the side of a flat bed without using bedrails?: A Little Help needed moving to and from a bed to a chair (including a wheelchair)?: A Little Help needed standing up from a chair using your arms (e.g., wheelchair or bedside chair)?: A Little Help needed to walk in  hospital room?: A Little Help needed climbing 3-5 steps with a railing? : A Lot 6 Click Score: 17    End of Session Equipment Utilized During Treatment: Gait belt Activity Tolerance: Patient tolerated treatment well Patient left: in chair;with call bell/phone within reach;with chair alarm set Nurse Communication: Mobility status PT Visit Diagnosis: Unsteadiness on feet (R26.81);Other abnormalities of gait and mobility (R26.89);Muscle weakness (generalized) (M62.81);Difficulty in walking, not elsewhere classified (R26.2);Pain Pain - Right/Left: Right Pain - part of body: Leg     Time: 1115-1150 PT Time Calculation (min) (ACUTE ONLY): 35 min  Charges:  $Gait Training: 8-22 mins $Therapeutic Activity: 8-22 mins                     Julaine Fusi PTA 02/24/20, 12:09 PM

## 2020-02-25 MED ORDER — METOPROLOL SUCCINATE ER 25 MG PO TB24
25.0000 mg | ORAL_TABLET | Freq: Every day | ORAL | Status: DC
  Administered 2020-02-26 – 2020-03-02 (×6): 25 mg via ORAL
  Filled 2020-02-25 (×6): qty 1

## 2020-02-25 MED ORDER — OXYCODONE HCL ER 10 MG PO T12A
10.0000 mg | EXTENDED_RELEASE_TABLET | Freq: Two times a day (BID) | ORAL | Status: DC
  Administered 2020-02-25 – 2020-03-05 (×18): 10 mg via ORAL
  Filled 2020-02-25 (×18): qty 1

## 2020-02-25 MED ORDER — DULOXETINE HCL 30 MG PO CPEP
30.0000 mg | ORAL_CAPSULE | Freq: Every day | ORAL | Status: DC
  Administered 2020-02-26 – 2020-03-04 (×7): 30 mg via ORAL
  Filled 2020-02-25 (×9): qty 1

## 2020-02-25 NOTE — Progress Notes (Signed)
PROGRESS NOTE    Rhonda Martin  TIW:[id number] DOB: 11-14-1975 DOA: 02/14/2020 PCP: Volney American, PA-C   Subjective:  Patient was seen and examined, surprisingly not complaining of any pain this morning. No issues overnight  Remains difficult for discharge     Hospital course Rhonda Martin is a 44 y.o. Caucasian female with a known history of Charcot- Marie-Tooth disease, alcohol abuse, anxiety/depression, hepatitis C, narcotic/polysubstance abuse, liver cirrhosis, who underwent right total knee arthroplasty on 07/13/537 that was complicated by wound dehiscence and bleeding for which she had a right total knee revision on 8/29 by Dr. Rudene Christians at which time she was apparently found to have osteomyelitis, subsequently presented  with acute onset of suspected dislocation and worsening swelling, tenderness and pain on her right knee.  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 On antibiotics   Assessment & Plan:   Active Problems:   Weakness   Generalized anxiety disorder   HTN (hypertension)   Osteomyelitis of right lower extremity (HCC)   Ischemic chest pain (HCC)   Failed right total knee replacement: with associated right lower extremity osteomyelitis and severe nonpurulent knee cellulitis.  -Stable -Status post AKA 02/16/2020 -D/c IV vanco, zosyn & fluconazole as wound is clean as per vascular surg. Surgical path is pending still.  Previous knee cultures grew stap epi and candidia parapsilosis. W/ wound dehiscence, noncompliance w/ medication and alcohol/drug abuse, ortho surg feels the infection will not resolve so pt will go for R AKA. 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 Continue on oxycodone, gabapentin, oxycontin as pt has severe pain.  Continues to complain of pain--- above medication will be titrated   Sepsis/leukocytosis -Resolved  -r/o w/ only tachycardia, elevated lactic acid, right knee infection,  normal WBC, RR 20 was present on admission.     HTN: Monitoring, continue home medication  metoprolol, Lasix , as needed hydralazine Hypokalemia: Stable repleting accordingly GERD: continue on pantoprazole   Peripheral neuropathy:  Stable, continue home dose Neurontin -Slowly tapering down narcotics   Acute on chronic pain syndrome We will make no changes to medications today -Currently on OxyIR 10 mg as needed every 6 hours, Ultram 50 mg p.o. every 6 hours as needed -Scheduled oxycodone 20 mg every 12 hours -Once again extensive discussion regarding pain management was held with patient she expressed understanding seem to be cooperative  -Adjustment reducing her OxyIR from 10 to 5 mg every 6 hours as needed, continue Ultram 50 mg every 6 hours as needed, continue scheduled oxycodone 20 mg every 12 hours, Neurontin 400 mg p.o. 3 times daily     Depression / anxiety  -stable continue on home dose of SSRI, olanzapine Cirrhosis: Stable No signs of asterixis, encephalopathy, continue lactulose and spironolactone  Thrombocytopenia: Monitoring, resolved Transaminitis: likely secondary to cirrhosis. AST is labile.   Charcot-Marie Tooth Disease: continue w/ supportive care    DVT prophylaxis: SCDs Code Status: full  Family Communication:  Disposition Plan: Remains medically stable, pending SNF discharge, appreciate social worker assistance Pending discharge to SNF no approval yet  (previous admission, PT/OT recs SNF but pt refused). Waiting on bed placement for SNF Difficult to discharge at SNF still does not have any accepted Patient is now agreeable to go to SNF    Status is: Inpatient  Remains inpatient appropriate because:IV treatments appropriate due to intensity of illness or inability to take PO, stable for d/c to SNF, waiting on bed placement    Dispo: The patient is from: Home  Anticipated d/c is to: SNF              Anticipated d/c date is: whenever a SNF bed is available               Patient currently is medically stable to  d/c.     Consultants:   Ortho surgery    Procedures:   Antimicrobials:     Objective: Vitals:   02/24/20 2000 02/25/20 0022 02/25/20 0442 02/25/20 0814  BP: 136/75 129/70 130/75 (!) 149/88  Pulse: 97 (!) 101 (!) 102 (!) 102  Resp: 14 16 14 16   Temp: 98.7 F (37.1 C) 99 F (37.2 C) 98.7 F (37.1 C) 98.4 F (36.9 C)  TempSrc: Oral Oral Oral Oral  SpO2: 99% 98% 96% 99%  Weight:      Height:        Intake/Output Summary (Last 24 hours) at 02/25/2020 1205 Last data filed at 02/25/2020 0442 Gross per 24 hour  Intake --  Output 2750 ml  Net -2750 ml   Filed Weights   02/14/20 1722  Weight: 60.8 kg      Physical Exam:   General:  Alert, oriented, cooperative, no distress;   HEENT:  Normocephalic, PERRL, otherwise with in Normal limits   Neuro:  CNII-XII intact. , normal motor and sensation, reflexes intact   Lungs:   Clear to auscultation BL, Respirations unlabored, no wheezes / crackles  Cardio:    S1/S2, RRR, No murmure, No Rubs or Gallops   Abdomen:   Soft, non-tender, bowel sounds active all four quadrants,  no guarding or peritoneal signs.  Muscular skeletal:   Right AKA Limited exam - in bed, able to move all 4 extremities, Normal strength,  2+ pulses,  symmetric, No pitting edema  Skin:  Dry, warm to touch, negative for any Rashes, No open wounds  Wounds: Please see nursing documentation -right AKA postsurgical stump dressing in place                      Data Reviewed: I have personally reviewed following labs and imaging studies  CBC: Recent Labs  Lab 02/19/20 0353 02/20/20 0322  WBC 8.6 9.1  HGB 9.8* 9.0*  HCT 29.3* 27.6*  MCV 84.4 84.7  PLT 159 678   Basic Metabolic Panel: Recent Labs  Lab 02/19/20 0353 02/20/20 0322  NA 138 136  K 4.1 3.8  CL 103 101  CO2 28 27  GLUCOSE 120* 134*  BUN 9 10  CREATININE 0.43* 0.42*  CALCIUM 7.9* 8.1*   GFR: Estimated Creatinine Clearance: 77.1 mL/min (A) (by C-G formula based on SCr  of 0.42 mg/dL (L)). Liver Function Tests: Recent Labs  Lab 02/19/20 0353 02/20/20 0322  AST 46* 43*  ALT 25 23  ALKPHOS 90 78  BILITOT 0.7 0.7  PROT 6.2* 5.9*  ALBUMIN 2.4* 2.3*   No results for input(s): LIPASE, AMYLASE in the last 168 hours. Recent Labs  Lab 02/19/20 0353 02/20/20 0322  AMMONIA 34 43*   Coagulation Profile: No results for input(s): INR, PROTIME in the last 168 hours. Cardiac Enzymes: No results for input(s): CKTOTAL, CKMB, CKMBINDEX, TROPONINI in the last 168 hours. No results for input(s): PROCALCITON, LATICACIDVEN in the last 168 hours.  Recent Results (from the past 240 hour(s))  Surgical PCR screen     Status: None   Collection Time: 02/16/20  1:37 AM   Specimen: Nasal Mucosa; Nasal Swab  Result Value Ref Range Status  MRSA, PCR NEGATIVE NEGATIVE Final   Staphylococcus aureus NEGATIVE NEGATIVE Final    Comment: (NOTE) The Xpert SA Assay (FDA approved for NASAL specimens in patients 68 years of age and older), is one component of a comprehensive surveillance program. It is not intended to diagnose infection nor to guide or monitor treatment. Performed at [facility], [address] [place], [place] [address]          Radiology Studies: No results found.      Scheduled Meds: . Chlorhexidine Gluconate Cloth  6 each Topical Once  . dicyclomine  20 mg Oral TID AC & HS  . DULoxetine  20 mg Oral Daily  . enoxaparin (LOVENOX) injection  40 mg Subcutaneous Q24H  . ferrous sulfate  325 mg Oral Q breakfast  . folic acid  1 mg Oral Daily  . furosemide  20 mg Oral Daily  . gabapentin  400 mg Oral TID  . lactulose  30 g Oral TID  . magnesium oxide  400 mg Oral Daily  . metoprolol succinate  12.5 mg Oral Daily  . multivitamin with minerals  1 tablet Oral Daily  . OLANZapine  10 mg Oral QHS  . oxyCODONE  15 mg Oral Q12H  . pantoprazole  40 mg Oral Daily  . senna  1 tablet Oral BID  . spironolactone  25 mg Oral Daily  .  sucralfate  1 g Oral TID WC & HS  . thiamine  100 mg Oral Daily  . vitamin B-12  500 mcg Oral Daily   Continuous Infusions: . sodium chloride Stopped (02/22/20 2210)     LOS: 11 days    Time spent: 31 mins     Deatra James, MD [facility] Pager [phone]  If 7PM-7AM, please contact night-coverage [email or web address] 02/25/2020, 12:05 PM

## 2020-02-25 NOTE — Progress Notes (Signed)
PT Cancellation Note  Patient Details Name: Rhonda Martin MRN: [id number] DOB: 10-Aug-1975   Cancelled Treatment:    Reason Eval/Treat Not Completed: Pain limiting ability to participate   Pt sitting EOB on arrival this am generally comfortable at 9:00 but asked to return after pain medication around 10:30.  Upon arrival, pt laying on left side,crying in pain stating she is unable to participate due to pain and "all I want to do is cry."  Encouragement given but declined session at this time.  Will offer again this pm as time allows.   [name] 02/25/2020, 11:52 AM

## 2020-02-25 NOTE — Progress Notes (Signed)
PT Cancellation Note  Patient Details Name: Rhonda Martin MRN: [id number] DOB: May 27, 1976   Cancelled Treatment:    Reason Eval/Treat Not Completed: Pain limiting ability to participate.  Pt in semi-fowler's position upon arrival to room.  Pt reports that she was still in a lot of pain and unwilling to perform PT.  Pt given encouragement, however refused therapy.  Pt will be seen at later date as pain allows.   Gwenlyn Saran, PT, DPT 02/25/20, 3:03 PM

## 2020-02-26 ENCOUNTER — Inpatient Hospital Stay: Payer: Medicaid Other

## 2020-02-26 ENCOUNTER — Inpatient Hospital Stay: Payer: Medicaid Other | Admitting: Family Medicine

## 2020-02-26 DIAGNOSIS — R509 Fever, unspecified: Secondary | ICD-10-CM

## 2020-02-26 LAB — CBC WITH DIFFERENTIAL/PLATELET
Abs Immature Granulocytes: 0.03 10*3/uL (ref 0.00–0.07)
Basophils Absolute: 0.1 10*3/uL (ref 0.0–0.1)
Basophils Relative: 1 %
Eosinophils Absolute: 0.1 10*3/uL (ref 0.0–0.5)
Eosinophils Relative: 2 %
HCT: 29.8 % — ABNORMAL LOW (ref 36.0–46.0)
Hemoglobin: 9.4 g/dL — ABNORMAL LOW (ref 12.0–15.0)
Immature Granulocytes: 0 %
Lymphocytes Relative: 5 %
Lymphs Abs: 0.4 10*3/uL — ABNORMAL LOW (ref 0.7–4.0)
MCH: 27.2 pg (ref 26.0–34.0)
MCHC: 31.5 g/dL (ref 30.0–36.0)
MCV: 86.4 fL (ref 80.0–100.0)
Monocytes Absolute: 0.7 10*3/uL (ref 0.1–1.0)
Monocytes Relative: 10 %
Neutro Abs: 6.1 10*3/uL (ref 1.7–7.7)
Neutrophils Relative %: 82 %
Platelets: 176 10*3/uL (ref 150–400)
RBC: 3.45 MIL/uL — ABNORMAL LOW (ref 3.87–5.11)
RDW: 17.4 % — ABNORMAL HIGH (ref 11.5–15.5)
WBC: 7.4 10*3/uL (ref 4.0–10.5)
nRBC: 0 % (ref 0.0–0.2)

## 2020-02-26 LAB — COMPREHENSIVE METABOLIC PANEL
ALT: 24 U/L (ref 0–44)
AST: 39 U/L (ref 15–41)
Albumin: 2.7 g/dL — ABNORMAL LOW (ref 3.5–5.0)
Alkaline Phosphatase: 92 U/L (ref 38–126)
Anion gap: 8 (ref 5–15)
BUN: 11 mg/dL (ref 6–20)
CO2: 24 mmol/L (ref 22–32)
Calcium: 8.6 mg/dL — ABNORMAL LOW (ref 8.9–10.3)
Chloride: 101 mmol/L (ref 98–111)
Creatinine, Ser: 0.42 mg/dL — ABNORMAL LOW (ref 0.44–1.00)
GFR calc Af Amer: >60 mL/min (ref >60)
GFR calc non Af Amer: >60 mL/min (ref >60)
Glucose, Bld: 125 mg/dL — ABNORMAL HIGH (ref 70–99)
Potassium: 4 mmol/L (ref 3.5–5.1)
Sodium: 133 mmol/L — ABNORMAL LOW (ref 135–145)
Total Bilirubin: 0.7 mg/dL (ref 0.3–1.2)
Total Protein: 6.6 g/dL (ref 6.5–8.1)

## 2020-02-26 LAB — LACTIC ACID, PLASMA
Lactic Acid, Venous: 1.4 mmol/L (ref 0.5–1.9)
Lactic Acid, Venous: 1.5 mmol/L (ref 0.5–1.9)

## 2020-02-26 LAB — PROCALCITONIN: Procalcitonin: <0.1 ng/mL

## 2020-02-26 NOTE — Plan of Care (Signed)
  Problem: Health Behavior/Discharge Planning: Goal: Ability to manage health-related needs will improve Outcome: Progressing   Problem: Clinical Measurements: Goal: Ability to maintain clinical measurements within normal limits will improve Outcome: Progressing Goal: Will remain free from infection Outcome: Progressing Goal: Diagnostic test results will improve Outcome: Not Progressing Goal: Respiratory complications will improve Outcome: Not Progressing Goal: Cardiovascular complication will be avoided Outcome: Not Progressing   Problem: Activity: Goal: Risk for activity intolerance will decrease Outcome: Not Progressing   Problem: Nutrition: Goal: Adequate nutrition will be maintained Outcome: Not Progressing   Problem: Coping: Goal: Level of anxiety will decrease Outcome: Not Progressing   Problem: Pain Managment: Goal: General experience of comfort will improve Outcome: Not Progressing   Problem: Elimination: Goal: Will not experience complications related to bowel motility Outcome: Not Progressing Goal: Will not experience complications related to urinary retention Outcome: Not Progressing

## 2020-02-26 NOTE — Progress Notes (Signed)
Will notify Doctor of tachycardia  Yellow MEWS started VS checked x 2    02/26/20 0822  Vitals  Temp 99.6 F (37.6 C)  Temp Source Oral  BP (!) 146/78  MAP (mmHg) 94  BP Location Right Arm  BP Method Automatic  Patient Position (if appropriate) Lying  Pulse Rate Source Monitor  Resp 15  Oxygen Therapy  SpO2 99 %  O2 Device Room Air

## 2020-02-26 NOTE — Progress Notes (Signed)
PT Cancellation Note  Patient Details Name: Rhonda Martin MRN: [id number] DOB: 1976-01-31   Cancelled Treatment:    Reason Eval/Treat Not Completed: Medical issues which prohibited therapy   Telemetry monitored for several minutes prior to attempt.  HR 136-141 at rest.  Pt c/o feeling poorly and running a fever - 99.9 last taken.  She voices concern over Covid stating she had 2 family members pass recently from it.  Encouragement given and encouraged to discuss with MD/RN her concerns.  Testing on admission negative.  Will hold for now due to HR and continue as appropriate.   [name] 02/26/2020, 9:53 AM

## 2020-02-26 NOTE — Progress Notes (Signed)
PT Cancellation Note  Patient Details Name: Rhonda Martin MRN: [id number] DOB: 13-Feb-1976   Cancelled Treatment:    Reason Eval/Treat Not Completed: Other (comment)   HR lower this pm 107 at rest.  Pt offered and encouraged session.  She continues to report malaise and not feeling well. She stated she is not up to anything this pm and declines interventions.     [name] 02/26/2020, 2:03 PM

## 2020-02-26 NOTE — Progress Notes (Signed)
°   02/26/20 0830  Assess: MEWS Score  Temp 99.9 F (37.7 C)  BP 136/77  Pulse Rate (!) 127  Resp 16  SpO2 98 %  Assess: MEWS Score  MEWS Temp 0  MEWS Systolic 0  MEWS Pulse 2  MEWS RR 0  MEWS LOC 0  MEWS Score 2  MEWS Score Color Yellow  Assess: if the MEWS score is Yellow or Red  Were vital signs taken at a resting state? Yes  Focused Assessment Change from prior assessment (see assessment flowsheet)  Early Detection of Sepsis Score *See Row Information* Low  MEWS guidelines implemented *See Row Information* Yes  Treat  MEWS Interventions Escalated (See documentation below)  Take Vital Signs  Increase Vital Sign Frequency  Yellow: Q 2hr X 2 then Q 4hr X 2, if remains yellow, continue Q 4hrs  Escalate  MEWS: Escalate Yellow: discuss with charge nurse/RN and consider discussing with provider and RRT  Notify: Charge Nurse/RN  Name of Charge Nurse/RN Notified Wilder Glade  Date Charge Nurse/RN Notified 02/26/20  Time Charge Nurse/RN Notified 0820  Notify: Provider  Provider Name/Title Dr Nila Nephew  Date Provider Notified 02/26/20  Time Provider Notified 0830  Notification Type Page  Notification Reason Change in status (elevated heart rate)  Response No new orders  Date of Provider Response 02/26/20  Time of Provider Response [phone]

## 2020-02-26 NOTE — Progress Notes (Signed)
OT Cancellation Note  Patient Details Name: Rhonda Martin MRN: [id number] DOB: 1975-11-13   Cancelled Treatment:    Reason Eval/Treat Not Completed: Other (comment). Pt supine in bed and continues to have low grade fever this afternoon. Pt reports, " I just don't feel good at all". OT attempts to encourage pt to participate but pt declines at this time. OT will follow up at next available time.   Darleen Crocker, MS, OTR/L , CBIS ascom [phone]  02/26/20, 3:13 PM   02/26/2020, 3:13 PM

## 2020-02-26 NOTE — Progress Notes (Signed)
Patient refused to let this writer wrap leg, requested that I leave her kerlix and ace wrap at bedside.

## 2020-02-26 NOTE — Progress Notes (Signed)
PROGRESS NOTE    Rhonda Martin  POE:[id number] DOB: May 15, 1976 DOA: 02/14/2020 PCP: Volney American, PA-C   Subjective:  The patient was seen and examined this morning, stable once again complaining of pain, not feeling well.. Patient was found tachycardic with heart rate of 127, blood pressure stable at 136/77 temp 99.9    Hospital course Rhonda Martin is a 44 y.o. Caucasian female with a known history of Charcot- Marie-Tooth disease, alcohol abuse, anxiety/depression, hepatitis C, narcotic/polysubstance abuse, liver cirrhosis, who underwent right total knee arthroplasty on 08/06/5398 that was complicated by wound dehiscence and bleeding for which she had a right total knee revision on 8/29 by Dr. Rudene Christians at which time she was apparently found to have osteomyelitis, subsequently presented  with acute onset of suspected dislocation and worsening swelling, tenderness and pain on her right knee.  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 On antibiotics   Assessment & Plan:   Active Problems:   Weakness   Generalized anxiety disorder   HTN (hypertension)   Osteomyelitis of right lower extremity (HCC)   Ischemic chest pain (HCC)   Low-grade fever/tachycardic  -Patient was found tachycardic with heart rate of 127, blood pressure stable 136/77, temp of 99.9 this AM. -Patient has not had labs for past few days, repeated CBC CMP within normal limits -Obtaining chest x-ray, lactic acid and procalcitonin level -Denies of having any dysuria shortness of breath or chest pain, denies any cough or congestion. -WBC 7.4, lactic acid 1.4, procalcitonin <0.10 -Chest x-ray reviewed, improved from the last chest x-ray, finding consistent with improving pneumonia versus edema -We will abstain from any antibiotics use at this time, will monitor closely  Failed right total knee replacement: with associated right lower extremity osteomyelitis and severe nonpurulent knee cellulitis.  -Remained stable -Status post  AKA 02/16/2020 -D/c IV vanco, zosyn & fluconazole as wound is clean as per vascular surg. Surgical path is pending still.  Previous knee cultures grew stap epi and candidia parapsilosis. W/ wound dehiscence, noncompliance w/ medication and alcohol/drug abuse, ortho surg feels the infection will not resolve so pt will go for R AKA. 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 Continue on oxycodone, gabapentin, oxycontin as pt has severe pain.  Continues to complain of pain--- above medication will be titrated   Sepsis/leukocytosis -Repeated CBC CMP lactic acid, procalcitonin level all within normal limits -Previous sepsis physiology has resolved HTN: Monitoring, continue home medication metoprolol, Lasix , as needed hydralazine Hypokalemia: Stable repleting accordingly GERD: continue on pantoprazole   Peripheral neuropathy:  Stable, continue home dose Neurontin -Slowly tapering down narcotics   Acute on chronic pain syndrome We will make no changes to medications today -Currently on OxyIR 10 mg as needed every 6 hours, Ultram 50 mg p.o. every 6 hours as needed -Scheduled oxycodone 20>> reduce to 15 >>>  mg every 12 hours -Once again extensive discussion regarding pain management was held with patient she expressed understanding seem to be cooperative  -Adjustment reducing her OxyIR from 10 to 5 mg every 6 hours as needed, continue Ultram 50 mg every 6 hours as needed, continue scheduled oxycodone 20 mg every 12 hours, Neurontin 400 mg p.o. 3 times daily     Depression / anxiety  -stable continue on home dose of SSRI, olanzapine Cirrhosis: Stable No signs of asterixis, encephalopathy, continue lactulose and spironolactone  Thrombocytopenia: Monitoring, resolved Transaminitis: likely secondary to cirrhosis. AST is labile.   Charcot-Marie Tooth Disease: continue w/ supportive care    DVT prophylaxis: SCDs Code  Status: full  Family Communication:  Disposition Plan: Remains medically stable, pending SNF  discharge, appreciate social worker assistance Pending discharge to SNF no approval yet  (previous admission, PT/OT recs SNF but pt refused). Waiting on bed placement for SNF Difficult to discharge at SNF still does not have any accepted Patient is now agreeable to go to SNF    Status is: Inpatient  Remains inpatient appropriate because:IV treatments appropriate due to intensity of illness or inability to take PO, stable for d/c to SNF, waiting on bed placement    Dispo: The patient is from: Home              Anticipated d/c is to: SNF              Anticipated d/c date is: whenever a SNF bed is available               Patient currently is medically stable to d/c.     Consultants:   Ortho surgery    Procedures:   Antimicrobials:     Objective: Vitals:   02/25/20 2103 02/26/20 0822 02/26/20 0830 02/26/20 1054  BP: 121/69 (!) 146/78 136/77 132/66  Pulse: 96 (!) 126 (!) 127 (!) 124  Resp: 17 15 16 12   Temp: 98.8 F (37.1 C) 99.6 F (37.6 C) 99.9 F (37.7 C) 99.1 F (37.3 C)  TempSrc: Oral Axillary Oral Oral  SpO2: 94% 99% 98% 98%  Weight:      Height:        Intake/Output Summary (Last 24 hours) at 02/26/2020 1142 Last data filed at 02/26/2020 1017 Gross per 24 hour  Intake 240 ml  Output 2250 ml  Net -2010 ml   Filed Weights   02/14/20 1722  Weight: 60.8 kg         Physical Exam:   General:  Alert, oriented, cooperative, no distress;   HEENT:  Normocephalic, PERRL, otherwise with in Normal limits   Neuro:  CNII-XII intact. , normal motor and sensation, reflexes intact   Lungs:   Clear to auscultation BL, Respirations unlabored, no wheezes / crackles  Cardio:    S1/S2, RRR, No murmure, No Rubs or Gallops   Abdomen:   Soft, non-tender, bowel sounds active all four quadrants,  no guarding or peritoneal signs.  Muscular skeletal:   Right AKA Limited exam - in bed, able to move all 4 extremities, Normal strength,  2+ pulses,  symmetric, No pitting edema   Skin:  Dry, warm to touch, negative for any Rashes, right lower extremity surgical wound dressing in place  Wounds: Please see nursing documentation-right AKA, surgical stump dressing in place                 Data Reviewed: I have personally reviewed following labs and imaging studies  CBC: Recent Labs  Lab 02/20/20 0322 02/26/20 0919  WBC 9.1 7.4  NEUTROABS  --  6.1  HGB 9.0* 9.4*  HCT 27.6* 29.8*  MCV 84.7 86.4  PLT 151 448   Basic Metabolic Panel: Recent Labs  Lab 02/20/20 0322 02/26/20 0919  NA 136 133*  K 3.8 4.0  CL 101 101  CO2 27 24  GLUCOSE 134* 125*  BUN 10 11  CREATININE 0.42* 0.42*  CALCIUM 8.1* 8.6*   GFR: Estimated Creatinine Clearance: 77.1 mL/min (A) (by C-G formula based on SCr of 0.42 mg/dL (L)). Liver Function Tests: Recent Labs  Lab 02/20/20 0322 02/26/20 0919  AST 43* 39  ALT 23 24  ALKPHOS 78 92  BILITOT 0.7 0.7  PROT 5.9* 6.6  ALBUMIN 2.3* 2.7*   No results for input(s): LIPASE, AMYLASE in the last 168 hours. Recent Labs  Lab 02/20/20 0322  AMMONIA 43*   Coagulation Profile: No results for input(s): INR, PROTIME in the last 168 hours. Cardiac Enzymes: No results for input(s): CKTOTAL, CKMB, CKMBINDEX, TROPONINI in the last 168 hours. Recent Labs  Lab 02/26/20 0919  PROCALCITON <0.10  LATICACIDVEN 1.4    No results found for this or any previous visit (from the past 240 hour(s)).       Radiology Studies: DG Chest Port 1 View  Result Date: 02/26/2020 CLINICAL DATA:  Fever, tachycardia EXAM: PORTABLE CHEST 1 VIEW COMPARISON:  01/16/2020 FINDINGS: The heart size and mediastinal contours are within normal limits. Mild interstitial prominence throughout the bilateral lung fields, which has slightly improved compared to the prior. No lobar airspace consolidation. No pleural effusion or pneumothorax. The visualized skeletal structures are unremarkable. IMPRESSION: Mild interstitial prominence throughout the bilateral  lung fields, which has slightly improved compared to the prior radiograph. Findings may represent improving pneumonitis or edema. Electronically Signed   By: Davina Poke D.O.   On: 02/26/2020 09:26        Scheduled Meds: . Chlorhexidine Gluconate Cloth  6 each Topical Once  . dicyclomine  20 mg Oral TID AC & HS  . DULoxetine  30 mg Oral Daily  . enoxaparin (LOVENOX) injection  40 mg Subcutaneous Q24H  . ferrous sulfate  325 mg Oral Q breakfast  . folic acid  1 mg Oral Daily  . furosemide  20 mg Oral Daily  . gabapentin  400 mg Oral TID  . lactulose  30 g Oral TID  . magnesium oxide  400 mg Oral Daily  . metoprolol succinate  25 mg Oral Daily  . multivitamin with minerals  1 tablet Oral Daily  . OLANZapine  10 mg Oral QHS  . oxyCODONE  10 mg Oral Q12H  . pantoprazole  40 mg Oral Daily  . senna  1 tablet Oral BID  . spironolactone  25 mg Oral Daily  . sucralfate  1 g Oral TID WC & HS  . vitamin B-12  500 mcg Oral Daily   Continuous Infusions: . sodium chloride Stopped (02/22/20 2210)     LOS: 12 days    Time spent: 31 mins     Deatra James, MD [facility] Pager [phone]  If 7PM-7AM, please contact night-coverage [email or web address] 02/26/2020, 11:42 AM

## 2020-02-27 DIAGNOSIS — R5081 Fever presenting with conditions classified elsewhere: Secondary | ICD-10-CM

## 2020-02-27 NOTE — Progress Notes (Signed)
PROGRESS NOTE    Rhonda Martin  GXQ:[id number] DOB: 1975/12/01 DOA: 02/14/2020 PCP: Volney American, PA-C   Subjective:  Patient was seen and examined, stable this morning No issues overnight Still requesting more pain medication (daily request) Informed no additive pain medication plan is to taper down    Hospital course Rhonda Martin is a 44 y.o. Caucasian female with a known history of Charcot- Marie-Tooth disease, alcohol abuse, anxiety/depression, hepatitis C, narcotic/polysubstance abuse, liver cirrhosis, who underwent right total knee arthroplasty on 06/11/4816 that was complicated by wound dehiscence and bleeding for which she had a right total knee revision on 8/29 by Dr. Rudene Christians at which time she was apparently found to have osteomyelitis, subsequently presented  with acute onset of suspected dislocation and worsening swelling, tenderness and pain on her right knee.  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 On antibiotics   Assessment & Plan:   Active Problems:   Weakness   Generalized anxiety disorder   HTN (hypertension)   Osteomyelitis of right lower extremity (HCC)   Ischemic chest pain (HCC)   Low-grade fever/tachycardic (on 02/26/2020) -Resolved -Hemodynamically stable, labs within normal limits no signs of infection. -Denies of having any dysuria shortness of breath or chest pain, denies any cough or congestion. -WBC 7.4, lactic acid 1.4, procalcitonin <0.10 -Chest x-ray reviewed, improved from the last chest x-ray, finding consistent with improving pneumonia versus edema -We will abstain from any antibiotics use at this time, will monitor closely  Failed right total knee replacement: with associated right lower extremity osteomyelitis and severe nonpurulent knee cellulitis.  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 Surgical path is pending still.  Previous knee cultures grew stap epi and  candidia parapsilosis. W/ wound dehiscence, noncompliance w/ medication and alcohol/drug abuse, ortho surg feels the infection will not resolve so pt will go for R AKA. 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 Continue on oxycodone, gabapentin, oxycontin as pt has severe pain.  Continues to complain of pain--- above medication will be titrated   Sepsis/leukocytosis -Previous sepsis physiology resolved HTN: Monitoring, continue home medication metoprolol, Lasix , as needed hydralazine Hypokalemia: Stable repleting accordingly GERD: continue on pantoprazole   Peripheral neuropathy:  Stable, continue home dose Neurontin -Slowly tapering down narcotics   Acute on chronic pain syndrome We will make no changes to medications today -Currently on OxyIR 10 mg as needed every 6 hours, Ultram 50 mg p.o. every 6 hours as needed -Scheduled oxycodone 20>> reduce to 15 >>>  mg every 12 hours -Once again extensive discussion regarding pain management was held with patient she expressed understanding seem to be cooperative  -Adjustment reducing her OxyIR from 10 to 5 mg every 6 hours as needed, continue Ultram 50 mg every 6 hours as needed, continue scheduled oxycodone 20 mg every 12 hours, Neurontin 400 mg p.o. 3 times daily     Depression / anxiety  -stable continue on home dose of SSRI, olanzapine Cirrhosis: Stable No signs of asterixis, encephalopathy, continue lactulose and spironolactone  Thrombocytopenia: Monitoring, resolved Transaminitis: likely secondary to cirrhosis. AST is labile.   Charcot-Marie Tooth Disease: continue w/ supportive care    DVT prophylaxis: SCDs Code Status: full  Family Communication:  Disposition Plan: Remains medically stable, pending SNF discharge, appreciate social worker assistance Pending discharge to SNF no approval yet  (previous admission, PT/OT recs SNF but pt refused). Waiting on bed placement for SNF Difficult to discharge at SNF still  does not have any accepted Patient  is now agreeable to go to SNF    Status is: Inpatient  Remains inpatient appropriate because:IV treatments appropriate due to intensity of illness or inability to take PO, stable for d/c to SNF, waiting on bed placement    Dispo: The patient is from: Home              Anticipated d/c is to: SNF              Anticipated d/c date is: whenever a SNF bed is available               Patient currently is medically stable to d/c.     Consultants:   Ortho surgery    Procedures:   Antimicrobials:     Objective: Vitals:   02/27/20 0047 02/27/20 0235 02/27/20 0446 02/27/20 0803  BP: 126/68 137/75 112/69 122/72  Pulse: (!) 116 (!) 118 (!) 112 (!) 115  Resp: 18 18 14 16   Temp: 97.9 F (36.6 C) 98.7 F (37.1 C) 98.8 F (37.1 C) 99 F (37.2 C)  TempSrc: Oral Oral Oral Oral  SpO2: 96% 96% 96% 97%  Weight:      Height:        Intake/Output Summary (Last 24 hours) at 02/27/2020 1153 Last data filed at 02/27/2020 1005 Gross per 24 hour  Intake 960 ml  Output 550 ml  Net 410 ml   Filed Weights   02/14/20 1722  Weight: 60.8 kg      Physical Exam:   General:  Alert, oriented, cooperative, no distress;   HEENT:  Normocephalic, PERRL, otherwise with in Normal limits   Neuro:  CNII-XII intact. , normal motor and sensation, reflexes intact   Lungs:   Clear to auscultation BL, Respirations unlabored, no wheezes / crackles  Cardio:    S1/S2, RRR, No murmure, No Rubs or Gallops   Abdomen:   Soft, non-tender, bowel sounds active all four quadrants,  no guarding or peritoneal signs.  Muscular skeletal:   Right AKA Limited exam - in bed, able to move all 4 extremities, Normal strength,  2+ pulses,  symmetric, No pitting edema  Skin:  Dry, warm to touch, negative for any Rashes, right lower extremity surgical wound dressing in place  Wounds: Please see nursing documentation-right AKA, surgical stump dressing in place                 Data Reviewed: I have personally  reviewed following labs and imaging studies  CBC: Recent Labs  Lab 02/26/20 0919  WBC 7.4  NEUTROABS 6.1  HGB 9.4*  HCT 29.8*  MCV 86.4  PLT 035   Basic Metabolic Panel: Recent Labs  Lab 02/26/20 0919  NA 133*  K 4.0  CL 101  CO2 24  GLUCOSE 125*  BUN 11  CREATININE 0.42*  CALCIUM 8.6*   GFR: Estimated Creatinine Clearance: 77.1 mL/min (A) (by C-G formula based on SCr of 0.42 mg/dL (L)). Liver Function Tests: Recent Labs  Lab 02/26/20 0919  AST 39  ALT 24  ALKPHOS 92  BILITOT 0.7  PROT 6.6  ALBUMIN 2.7*   No results for input(s): LIPASE, AMYLASE in the last 168 hours. No results for input(s): AMMONIA in the last 168 hours. Coagulation Profile: No results for input(s): INR, PROTIME in the last 168 hours. Cardiac Enzymes: No results for input(s): CKTOTAL, CKMB, CKMBINDEX, TROPONINI in the last 168 hours. Recent Labs  Lab 02/26/20 0919 02/26/20 1136  PROCALCITON <  0.10  --   LATICACIDVEN 1.4 1.5    No results found for this or any previous visit (from the past 240 hour(s)).       Radiology Studies: DG Chest Port 1 View  Result Date: 02/26/2020 CLINICAL DATA:  Fever, tachycardia EXAM: PORTABLE CHEST 1 VIEW COMPARISON:  01/16/2020 FINDINGS: The heart size and mediastinal contours are within normal limits. Mild interstitial prominence throughout the bilateral lung fields, which has slightly improved compared to the prior. No lobar airspace consolidation. No pleural effusion or pneumothorax. The visualized skeletal structures are unremarkable. IMPRESSION: Mild interstitial prominence throughout the bilateral lung fields, which has slightly improved compared to the prior radiograph. Findings may represent improving pneumonitis or edema. Electronically Signed   By: Davina Poke D.O.   On: 02/26/2020 09:26        Scheduled Meds: . Chlorhexidine Gluconate Cloth  6 each Topical Once  . dicyclomine  20 mg Oral TID AC & HS  . DULoxetine  30 mg Oral Daily    . enoxaparin (LOVENOX) injection  40 mg Subcutaneous Q24H  . ferrous sulfate  325 mg Oral Q breakfast  . furosemide  20 mg Oral Daily  . gabapentin  400 mg Oral TID  . lactulose  30 g Oral TID  . magnesium oxide  400 mg Oral Daily  . metoprolol succinate  25 mg Oral Daily  . multivitamin with minerals  1 tablet Oral Daily  . OLANZapine  10 mg Oral QHS  . oxyCODONE  10 mg Oral Q12H  . pantoprazole  40 mg Oral Daily  . senna  1 tablet Oral BID  . spironolactone  25 mg Oral Daily  . sucralfate  1 g Oral TID WC & HS  . vitamin B-12  500 mcg Oral Daily   Continuous Infusions: . sodium chloride Stopped (02/22/20 2210)     LOS: 13 days    Time spent: 31 mins     Deatra James, MD [facility] Pager [phone]  If 7PM-7AM, please contact night-coverage [email or web address] 02/27/2020, 11:53 AM

## 2020-02-27 NOTE — Progress Notes (Signed)
   02/26/20 2301 02/27/20 0047  Assess: MEWS Score  Temp 100.3 F (37.9 C) 97.9 F (36.6 C)  BP 125/66 126/68  Pulse Rate (!) 107 (!) 116  Resp 16 18  SpO2 98 % 96 %  O2 Device Room [employer]

## 2020-02-27 NOTE — Progress Notes (Signed)
Physical Therapy Treatment Patient Details Name: Rhonda Martin MRN: [id number] DOB: [birth year] Today's Date: 02/27/2020    History of Present Illness Pt is a 44 y.o. female presenting to hospital 8/28 with confusion and R knee pain.  Pt with recent admit 8/5-8/10 for R TKA (Dr. Rudene Christians).  New imaging of R knee showing apparent fx fragment adjacent to lateral aspect of proximal tibia; subtle fx line extending through the tibial metaphysis.  Pt now admitted with sepsis secondary surgical site wound dehiscence; also noted with patellar tendon rupture.  Pt s/p R total knee revision and I&D 8/29.  PMH includes alcohol abuse, anxiety, h/o back injury, Charcot-Marie-Tooth disease, COPD, Hepatitis C, htn, thrombocytopenia.    PT Comments    Pt was long sitting in bed upon arriving. She was attempting to replace dressing when therapist entered. Pt frustrated that dressing continues to fall off however witnessed removing it herself. She agrees to session with encouragement. HR at rest 115 that elevated to 128 at highest during ambulation. She was able to ambulate a lap around rn unit with 2 seated rest. W/C follow for safety. Lengthy discussion about DC disposition. Pt states she wanted to go to rehab but now is leaning towards home. Unwilling to trial stair training today but states, " I will try tomorrow." Acute PT will continue to follow and progress as able per POC. Will benefit from PT at DC to address balance, strength, and safe functional mobility deficits. At conclusion of session, pt was sitting in recliner with chair alarm in place and call bell in reach.    Follow Up Recommendations  Supervision/Assistance - 24 hour;Supervision for mobility/OOB;SNF     Equipment Recommendations  None recommended by PT    Recommendations for Other Services       Precautions / Restrictions Precautions Precautions: Fall Precaution Booklet Issued: No Precaution Comments: mod fall Restrictions Weight Bearing  Restrictions: Yes RLE Weight Bearing: Partial weight bearing    Mobility  Bed Mobility Overal bed mobility: Modified Independent       Transfers Overall transfer level: Modified independent     Ambulation/Gait Ambulation/Gait assistance: Min guard Gait Distance (Feet): 160 Feet Assistive device: Rolling walker (2 wheeled) Gait Pattern/deviations:  (" hop to") Gait velocity: decreased   General Gait Details: Pt was able to ambulate 160 ft with 2 seated rest. Overall tolerated well. HR at rest 115. elevated to 128 at highest during ambulation.    Stairs         General stair comments: unwilling to trial stairs today but states she will tomorrow       Balance Overall balance assessment: Needs assistance Sitting-balance support: Feet supported Sitting balance-Leahy Scale: Good Sitting balance - Comments: no LOB in sitting without any extremty support   Standing balance support: Bilateral upper extremity supported;During functional activity Standing balance-Leahy Scale: Good Standing balance comment: no LOB with standing ot during ambulation. moderate fall risk 2/2 to pt's safety awareness/impulsivity         Cognition Arousal/Alertness: Awake/alert Behavior During Therapy: WFL for tasks assessed/performed Overall Cognitive Status: Within Functional Limits for tasks assessed          General Comments: Pt was A and O x 4 and agreeable and cooperative throughout      Exercises Other Exercises Other Exercises: RN aware pt wrap has fallen off and heart monitor not reading correctly        Pertinent Vitals/Pain Pain Assessment: 0-10 Pain Score: 3  Faces Pain Scale: Hurts a  little bit Pain Location: RLE Pain Descriptors / Indicators: Burning;Discomfort;Moaning;Grimacing;Guarding Pain Intervention(s): Limited activity within patient's tolerance;Monitored during session;Premedicated before session;Repositioned           PT Goals (current goals can now be  found in the care plan section) Acute Rehab PT Goals Patient Stated Goal: Move better so I can get a prosthetic. Progress towards PT goals: Progressing toward goals    Frequency    7X/week      PT Plan Current plan remains appropriate    Co-evaluation     PT goals addressed during session: Mobility/safety with mobility;Balance;Proper use of DME;Strengthening/ROM        AM-PAC PT "6 Clicks" Mobility   Outcome Measure  Help needed turning from your back to your side while in a flat bed without using bedrails?: A Little Help needed moving from lying on your back to sitting on the side of a flat bed without using bedrails?: A Little Help needed moving to and from a bed to a chair (including a wheelchair)?: A Little Help needed standing up from a chair using your arms (e.g., wheelchair or bedside chair)?: A Little Help needed to walk in hospital room?: A Little Help needed climbing 3-5 steps with a railing? : A Little 6 Click Score: 18    End of Session Equipment Utilized During Treatment: Gait belt Activity Tolerance: Patient tolerated treatment well Patient left: in chair;with call bell/phone within reach;with chair alarm set Nurse Communication: Mobility status PT Visit Diagnosis: Unsteadiness on feet (R26.81);Other abnormalities of gait and mobility (R26.89);Muscle weakness (generalized) (M62.81);Difficulty in walking, not elsewhere classified (R26.2);Pain Pain - Right/Left: Right Pain - part of body: Leg     Time: 0947-0962 PT Time Calculation (min) (ACUTE ONLY): 26 min  Charges:  $Gait Training: 8-22 mins $Therapeutic Activity: 8-22 mins                     Julaine Fusi PTA 02/27/20, 11:16 AM

## 2020-02-28 DIAGNOSIS — K219 Gastro-esophageal reflux disease without esophagitis: Secondary | ICD-10-CM

## 2020-02-28 DIAGNOSIS — G600 Hereditary motor and sensory neuropathy: Secondary | ICD-10-CM

## 2020-02-28 DIAGNOSIS — D649 Anemia, unspecified: Secondary | ICD-10-CM

## 2020-02-28 DIAGNOSIS — R400 Somnolence: Secondary | ICD-10-CM

## 2020-02-28 NOTE — Progress Notes (Signed)
Occupational Therapy Treatment Patient Details Name: Rhonda Martin MRN: [id number] DOB: [birth year] Today's Date: 02/28/2020    History of present illness Pt is a 44 y.o. female presenting to hospital 8/28 with confusion and R knee pain.  Pt with recent admit 8/5-8/10 for R TKA (Dr. Rudene Christians).  New imaging of R knee showing apparent fx fragment adjacent to lateral aspect of proximal tibia; subtle fx line extending through the tibial metaphysis.  Pt now admitted with sepsis secondary surgical site wound dehiscence; also noted with patellar tendon rupture.  Pt s/p R total knee revision and I&D 8/29.  PMH includes alcohol abuse, anxiety, h/o back injury, Charcot-Marie-Tooth disease, COPD, Hepatitis C, htn, thrombocytopenia.   OT comments  Rhonda Martin was seen for OT treatment on this date. Pt received in therapy gym finishing up PT session. Pt agreeable to transitioning to OT session. Pt returns to room, and OT facilitates seated UB grooming and bathing tasks as described below (See ADL section for additional detail). Pt continues to demo poor insight into deficits and safety awareness and requires consistent cueing for safety t/o ADL management. Pt requests use of BSC during session and performs squat pivot transfer given set-up/supervision assist and min assist for STS LB clothing management. Pt remains lethargic t/o session and requires consistent prompting/re-direction to task. Suspect pain medication influencing pt cognition. OT facilitates pt education on residual limb management techniques. Pt requires cueing to return demo understanding this date. Would benefit from further reinforcement. Pt progressing toward OT goals and continues to benefit from skilled OT services to maximize return to PLOF and minimize risk of future falls, injury, caregiver burden, and readmission. POC updated to reflect current pt needs/progress with functional performance. Discharge recommendation remains appropriate.    Follow Up  Recommendations  SNF    Equipment Recommendations       Recommendations for Other Services      Precautions / Restrictions Precautions Precautions: Fall Precaution Booklet Issued: No Precaution Comments: Moderate fall Restrictions Weight Bearing Restrictions: Yes Other Position/Activity Restrictions: R AKA       Mobility Bed Mobility Overal bed mobility: Modified Independent             General bed mobility comments: Deferred. Pt in WC/Chair at start/end of session.  Transfers Overall transfer level: Modified independent Equipment used: Rolling walker (2 wheeled) Transfers: Sit to/from Stand Sit to Stand: Min guard Stand pivot transfers: Min guard Squat pivot transfers: Min guard;Supervision     General transfer comment: CGA for safety, and cues for hand placement. Pt noted with difficulty sequencing/attending to complex motor tasks this date. Suspect lethargy due to medications.    Balance Overall balance assessment: Needs assistance Sitting-balance support: Feet supported Sitting balance-Leahy Scale: Good Sitting balance - Comments: Steady static sitting, reaching, within BOS.   Standing balance support: Bilateral upper extremity supported;During functional activity Standing balance-Leahy Scale: Good                             ADL either performed or assessed with clinical judgement   ADL Overall ADL's : Needs assistance/impaired     Grooming: Brushing hair;Cueing for sequencing;Supervision/safety;Sitting Grooming Details (indicate cue type and reason): Pt intermittently falling asleep during session, requires cueing to attend to tasks during session. Otherwise, performs hair combing w/o further assist. Upper Body Bathing: Set up;Supervision/ safety;Sitting Upper Body Bathing Details (indicate cue type and reason): OT facilitates seated hair washing during session. Pt noted with significant  decreased safety awareness when discussing UB  bathing, requests to stand over sink with therapist assist "to rinse the shampoo out". Pt and therapist discuss safety considerations for standing bathing including her ongoing need for UE support with standing tasks to maintain balance. Pt agreeable to using no-rinse shampoo cap as alternative. She performs task seated, with supervision for safety.             Toilet Transfer: Squat-pivot;BSC;Supervision/safety;Set up Toilet Transfer Details (indicate cue type and reason): Pt uses BUE to support squat pivot to/from [facility]. Demos good body mechanics and stability during transfer. Toileting- Clothing Manipulation and Hygiene: Minimal assistance;Sit to/from stand Toileting - Clothing Manipulation Details (indicate cue type and reason): Min A to support standing balance when pulling up underwear after toileting.     Functional mobility during ADLs: Rolling walker;Min guard       Vision Patient Visual Report: No change from baseline     Perception     Praxis      Cognition Arousal/Alertness: Lethargic;Suspect due to medications Behavior During Therapy: [facility] for tasks assessed/performed Overall Cognitive Status: Within Functional Limits for tasks assessed                                 General Comments: Pt noted to be intermittently letharghic t/o session, requires cueing/redirection to task/topic at hand. Occasional tangential comments including "they abuse old people in the halls". Re-directable.        Exercises Other Exercises Other Exercises: Pt educated on residual limb desensitization techniques including gentle self massage, gentle tapping, and importance of monitoring for signs of infection and keeping wound/incision site clean. Other Exercises: OT facilitates seated UB grooming and bathing tasks, toilet transfer, toileting, with safety education and re-direction provided t/o session. See ADL section for additional detail.   Shoulder Instructions        General Comments RLE wound bandages intact at start/end of session.    Pertinent Vitals/ Pain       Pain Assessment: No/denies pain Pain Location: Monitored during session. Pt premedicated prior to session.  Home Living                                          Prior Functioning/Environment              Frequency  Min 1X/week        Progress Toward Goals  OT Goals(current goals can now be found in the care plan section)  Progress towards OT goals: Progressing toward goals  Acute Rehab OT Goals Patient Stated Goal: To get stronger so I can shower. OT Goal Formulation: With patient Time For Goal Achievement: 03/13/20 Potential to Achieve Goals: [place]  Plan Discharge plan remains appropriate    Co-evaluation                 AM-PAC OT "6 Clicks" Daily Activity     Outcome Measure   Help from another person eating meals?: None Help from another person taking care of personal grooming?: A Little Help from another person toileting, which includes using toliet, bedpan, or urinal?: A Little Help from another person bathing (including washing, rinsing, drying)?: A Lot Help from another person to put on and taking off regular upper body clothing?: A Little Help from another person to put on and taking off regular  lower body clothing?: A Lot 6 Click Score: 17    End of Session Equipment Utilized During Treatment: Rolling walker;Gait belt  OT Visit Diagnosis: Other abnormalities of gait and mobility (R26.89);Muscle weakness (generalized) (M62.81);Pain;History of falling (Z91.81) Pain - Right/Left: Right Pain - part of body: Knee;Leg;Ankle and joints of foot   Activity Tolerance Patient tolerated treatment well   Patient Left in chair;with call bell/phone within reach;with chair alarm set   Nurse Communication Patient requests pain meds        Time: 1517-6160 OT Time Calculation (min): 30 min  Charges: OT General Charges $OT Visit: 1  Visit OT Treatments $Self Care/Home Management : 8-22 mins $Therapeutic Activity: 8-22 mins  Shara Blazing, M.S., OTR/L Ascom: [phone] 02/28/20, 11:25 AM

## 2020-02-28 NOTE — Progress Notes (Addendum)
PROGRESS NOTE    Rhonda Martin  JJK:[id number] DOB: 05-17-1976 DOA: 02/14/2020 PCP: Volney American, PA-C  Brief Narrative:  The patient is a 44 year old Caucasian female with a past medical history significant for but not limited to Charcot-Marie-Tooth disease, alcohol abuse, anxiety and depression, hepatitis C, history of narcotic and polysubstance abuse, liver cirrhosis, chronic pain syndrome as well as other comorbidities who underwent a right total knee arthroplasty on 08/13/1694 that was complicated by wound dehiscence and bleeding for which that she had a total right knee revision on 02/04/2020 by Dr. Rudene Christians.  At that time she was apparently found to have osteomyelitis subsequently and presented with acute onset of suspect dislocation and worsening swelling tenderness and pain of her right knee.  She is found to have a ruptured patella tendon at that time was discharged on 02/09/2020 after being hospitalized for sepsis secondary right knee wound infection after right recent total knee arthroplasty and medical noncompliance.  During that time IV had recommend on discharge the patient be discharged on doxycycline 100 mg twice a day for 6 weeks as well as oral fluconazole for 6 weeks.  She subsequently presented to the hospital again and was hospitalized on 02/15/2020 and since she feels her right total knee replacement vascular surgery was consulted by Dr. 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  PT OT evaluated and recommending SNF discharge however currently there is no bed available and she is unsafe to discharge home as she is still pending.  Husband is going to build a ramp for the patient and she can likely be discharged once cleared by PT OT.  Assessment & Plan:   Active Problems:   Weakness   Generalized anxiety disorder   HTN (hypertension)   Osteomyelitis of right lower extremity (HCC)   Ischemic chest pain (HCC)  SIRS with Low-grade fever/tachycardic (on  02/26/2020); Admission Sepsis Resolved  -Resolved -Hemodynamically stable, labs within normal limits no signs of infection. -Denies of having any dysuria shortness of breath or chest pain, denies any cough or congestion. -WBC 7.4 then and not repeated, lactic acid 1.4 with repeat of 1.5, procalcitonin <0.10 -Chest x-ray reviewed, improved from the last chest x-ray, finding consistent with improving pneumonia versus edema -We will abstain from any antibiotics use at this time -Continue to Monitor Closely   Failed right total knee replacement: with associated right lower extremity osteomyelitis and severe nonpurulent knee cellulitis.  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 Surgical path showed "RIGHT LEG, ABOVE KNEE AMPUTATION:  - SUBCUTANEOUS TISSUE AND SKELETAL MUSCLE WITH ABSCESS AND NECROSIS.  - CHRONIC OSTEOMYELITIS WITH BONE REMODELING.  - NEGATIVE FOR MALIGNANCY.  - CHRONIC INFLAMMATORY CHANGES ARE PRESENT WITHIN BONE AT THE PROXIMAL  RESECTION MARGIN. " -Previous knee cultures grew stap epi and candidia parapsilosis. W/ wound dehiscence, noncompliance w/ medication and alcohol/drug abuse, ortho surg felt the infection will not resolve so plan was for a R AKA. 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  -Continue on oxycodone, gabapentin, oxycontin as pt has severe pain.  -Continues to complain of pain but has been on chronic pain medication and this has at that time patient's blood count was 176,000 [place] monitor for signs and symptoms of bleeding; currently no overt bleeding noted -Repeat CBC in a.m. titrated down to her home regimen  Hyponatremia -Patient's Na+ was 133 on last check -Repeat CMP in the AM   HTN -Last blood pressure  was 116/71 -Continue monitoring,  -Continue home medication Metoprolol Succinate 25 mg po Daily and Furosemide 20 mg po daily and with Spironolactone 25 mg po Daily  -No longer on as needed  Hydralazine  Hypokalemia -Was replete previously and sble on last check with a potassium 4.0 on 02/26/20 -Continue to monitor intermittently and replete as necessary -Repeat CMP intermittently and will check in the AM  GERD -Continue Pantoprazole 40 mg po Daily  -C/w Sucralfate 1 gram po TIDwm and qHS  Peripheral Neuropathy:  -Stable, continue home dose Gabapentin 400 mg po TID -Slowly tapering down narcotics  Acute on chronic pain syndrome -Again We will make no changes to medications today -Yesterday Dr. Jamesetta Geralds had an extensive discussion regarding pain management was held with patient she expressed understanding seem to be cooperative -Current Pain Regimen: OxyIR from 5 mg every 6 hours as needed, continue Ultram 50 mg every 6 hours as needed, Gabapentin 400 mg p.o. 3 times daily  Depression and Anxiety -C/w Duloxetine 30 mg p.o. daily, Hydroxyzine 25 mg p.o. 3 times daily as needed anxiety, Olanzapine 10 mg p.o. nightly,  Cirrhosis and Hx of Hepatitis C -LFTs stable and no signs of asterixis -Last Ammonia was 43 on 9/14/2 -Did have some drowsiness today but was not noted to have encephalopathy,  -Continue Lactulose 30 grams TID, Furosemide 20 mg po Daily and Spironolactone 25 mg po Daily   Thrombocytopenia -Patient's platelet count has now improved and stabilized on last 3 checks the last check being on 02/26/2020 -Repeat CBC in the AM   Abnormal LFTs/Transaminitis -Likely secondary to cirrhosis. AST is labile but is now improved and was last checked at 39.  -Repeat CMP in the AM   Charcot-Marie Tooth Disease -Continue w/ supportive care   Normocytic Anemia -Patient's Hgb/Hct has been relatively stable with last check of 9.4/29.8 -C/w Ferrous Sulfate 325 mg po Daily with Breakfast -Continue to Monitor for S/Sx of Bleeding; Currently no overt bleeding noted -Repeat CBC in the AM   DVT prophylaxis: Enoxaparin 40 mg subcu every 24h  Code Status: FULL CODE    Family Communication: No family present at bedside  Disposition Plan: SNF; If Ramp can get built for home and she has IT trainer then can likely discharge Home with Home Health   Status is: Inpatient  Remains inpatient appropriate because:Unsafe d/c plan and Inpatient level of care appropriate due to severity of illness   Dispo: The patient is from: Home              Anticipated d/c is to: SNF              Anticipated d/c date is: 3 days              Patient currently is medically stable to d/c.  Consultants:   Orthopedic surgery  Vascular surgery  Infectious diseases   Procedures:  Right above-the-knee amputation on 02/16/2020 done by Dr. Leotis Pain  Antimicrobials: Anti-infectives (From admission, onward)   Start     Dose/Rate Route Frequency Ordered Stop   02/18/20 2200  linezolid (ZYVOX) IVPB 600 mg  Status:  Discontinued        600 mg 300 mL/hr over 60 Minutes Intravenous Every 12 hours 02/18/20 1530 02/19/20 1502   02/16/20 1038  vancomycin (VANCOCIN) 1-5 GM/200ML-% IVPB       Note to Pharmacy: Leonia Reader   : cabinet override      02/16/20 1038 02/16/20 2244   02/16/20 0600  ceFAZolin (ANCEF) IVPB 2g/100 mL premix  Status:  Discontinued        2 g 200 mL/hr over 30 Minutes Intravenous On call to O.R. 02/15/20 2156 02/16/20 1411   02/15/20 1300  vancomycin (VANCOCIN) IVPB 1000 mg/200 mL premix  Status:  Discontinued        1,000 mg 200 mL/hr over 60 Minutes Intravenous Every 12 hours 02/15/20 0244 02/18/20 1530   02/15/20 1000  fluconazole (DIFLUCAN) tablet 400 mg  Status:  Discontinued        400 mg Oral Daily 02/15/20 0103 02/19/20 1502   02/15/20 0600  piperacillin-tazobactam (ZOSYN) IVPB 3.375 g  Status:  Discontinued        3.375 g 12.5 mL/hr over 240 Minutes Intravenous Every 8 hours 02/15/20 0111 02/19/20 1502   02/15/20 0115  piperacillin-tazobactam (ZOSYN) IVPB 3.375 g  Status:  Discontinued        3.375 g 100 mL/hr over 30 Minutes Intravenous   Once 02/15/20 0103 02/15/20 0111   02/15/20 0115  vancomycin (VANCOCIN) IVPB 1000 mg/200 mL premix  Status:  Discontinued        1,000 mg 200 mL/hr over 60 Minutes Intravenous  Once 02/15/20 0103 02/15/20 0111   02/15/20 0115  vancomycin (VANCOREADY) IVPB 500 mg/100 mL        500 mg 100 mL/hr over 60 Minutes Intravenous  Once 02/15/20 0111 02/15/20 0417   02/14/20 2330  vancomycin (VANCOCIN) IVPB 1000 mg/200 mL premix        1,000 mg 200 mL/hr over 60 Minutes Intravenous  Once 02/14/20 2328 02/15/20 0235   02/14/20 2330  piperacillin-tazobactam (ZOSYN) IVPB 3.375 g        3.375 g 100 mL/hr over 30 Minutes Intravenous  Once 02/14/20 2328 02/15/20 0051        Subjective: Seen and examined at bedside and she is sitting in the chair and she is very drowsy and sleepy but does answer questions appropriately.  States that she did not sleep very well last night.  No nausea or vomiting.  Denies any other concerns or complaints at this time and had just gotten pain medication.   Objective: Vitals:   02/27/20 2320 02/28/20 0813 02/28/20 0846 02/28/20 1143  BP: 132/81 131/89 134/75 116/71  Pulse: 99 (!) 113 (!) 117 94  Resp: 14 16  16   Temp: 98.4 F (36.9 C) 98.5 F (36.9 C) 98.6 F (37 C) 98.5 F (36.9 C)  TempSrc: Oral Oral Oral Oral  SpO2: 98% 96% 95% 96%  Weight:      Height:       No intake or output data in the 24 hours ending 02/28/20 1336 Filed Weights   02/14/20 1722  Weight: 60.8 kg   Examination: Physical Exam:  Constitutional: Thin Caucasian female currently in NAD and appears calm and is very drowsy Eyes: Lids and conjunctivae normal, sclerae anicteric  ENMT: External Ears, Nose appear normal. Grossly normal hearing. Neck: Appears normal, supple, no cervical masses, normal ROM, no appreciable thyromegaly; no JVD Respiratory: Diminished to auscultation bilaterally, no wheezing, rales, rhonchi or crackles. Normal respiratory effort and patient is not tachypenic. No  accessory muscle use.  Unlabored breathing Cardiovascular: RRR, no murmurs / rubs / gallops. S1 and S2 auscultated.  Abdomen: Soft, non-tender, non-distended. Bowel sounds positive.  GU: Deferred. Musculoskeletal: No clubbing / cyanosis of digits/nails.  Patient has a right AKA Skin: No rashes, lesions, ulcers on limited skin evaluation. No induration; Warm and dry.  Neurologic: CN  2-12 grossly intact with no focal deficits. Romberg sign and cerebellar reflexes not assessed.  Psychiatric: Normal judgment and insight.  She is very drowsy but oriented x 3.  Extremely drowsy mood and appropriate affect.   Data Reviewed: I have personally reviewed following labs and imaging studies  CBC: Recent Labs  Lab 02/26/20 0919  WBC 7.4  NEUTROABS 6.1  HGB 9.4*  HCT 29.8*  MCV 86.4  PLT 427   Basic Metabolic Panel: Recent Labs  Lab 02/26/20 0919  NA 133*  K 4.0  CL 101  CO2 24  GLUCOSE 125*  BUN 11  CREATININE 0.42*  CALCIUM 8.6*   GFR: Estimated Creatinine Clearance: 77.1 mL/min (A) (by C-G formula based on SCr of 0.42 mg/dL (L)). Liver Function Tests: Recent Labs  Lab 02/26/20 0919  AST 39  ALT 24  ALKPHOS 92  BILITOT 0.7  PROT 6.6  ALBUMIN 2.7*   No results for input(s): LIPASE, AMYLASE in the last 168 hours. No results for input(s): AMMONIA in the last 168 hours. Coagulation Profile: No results for input(s): INR, PROTIME in the last 168 hours. Cardiac Enzymes: No results for input(s): CKTOTAL, CKMB, CKMBINDEX, TROPONINI in the last 168 hours. BNP (last 3 results) No results for input(s): PROBNP in the last 8760 hours. HbA1C: No results for input(s): HGBA1C in the last 72 hours. CBG: No results for input(s): GLUCAP in the last 168 hours. Lipid Profile: No results for input(s): CHOL, HDL, LDLCALC, TRIG, CHOLHDL, LDLDIRECT in the last 72 hours. Thyroid Function Tests: No results for input(s): TSH, T4TOTAL, FREET4, T3FREE, THYROIDAB in the last 72 hours. Anemia  Panel: No results for input(s): VITAMINB12, FOLATE, FERRITIN, TIBC, IRON, RETICCTPCT in the last 72 hours. Sepsis Labs: Recent Labs  Lab 02/26/20 0919 02/26/20 1136  PROCALCITON <0.10  --   LATICACIDVEN 1.4 1.5    No results found for this or any previous visit (from the past 240 hour(s)).   RN Pressure Injury Documentation:     Estimated body mass index is 24.51 kg/m as calculated from the following:   Height as of this encounter: 5\' 2"  (1.575 m).   Weight as of this encounter: 60.8 kg.  Malnutrition Type:      Malnutrition Characteristics:      Nutrition Interventions:      Radiology Studies: No results found.  Scheduled Meds: . Chlorhexidine Gluconate Cloth  6 each Topical Once  . dicyclomine  20 mg Oral TID AC & HS  . DULoxetine  30 mg Oral Daily  . enoxaparin (LOVENOX) injection  40 mg Subcutaneous Q24H  . ferrous sulfate  325 mg Oral Q breakfast  . furosemide  20 mg Oral Daily  . gabapentin  400 mg Oral TID  . lactulose  30 g Oral TID  . magnesium oxide  400 mg Oral Daily  . metoprolol succinate  25 mg Oral Daily  . multivitamin with minerals  1 tablet Oral Daily  . OLANZapine  10 mg Oral QHS  . oxyCODONE  10 mg Oral Q12H  . pantoprazole  40 mg Oral Daily  . senna  1 tablet Oral BID  . spironolactone  25 mg Oral Daily  . sucralfate  1 g Oral TID WC & HS  . vitamin B-12  500 mcg Oral Daily   Continuous Infusions: . sodium chloride Stopped (02/22/20 2210)    LOS: 14 days    Kerney Elbe, DO [facility] PAGER is on AMION  If 7PM-7AM, please contact night-coverage [email or web address]

## 2020-02-28 NOTE — Progress Notes (Signed)
Physical Therapy Treatment Patient Details Name: Rhonda Martin MRN: [id number] DOB: 1976-01-11 Today's Date: 02/28/2020    History of Present Illness Pt is a 44 y.o. female presenting to hospital 8/28 with confusion and R knee pain.  Pt with recent admit 8/5-8/10 for R TKA (Dr. Rudene Christians).  New imaging of R knee showing apparent fx fragment adjacent to lateral aspect of proximal tibia; subtle fx line extending through the tibial metaphysis.  Pt now admitted with sepsis secondary surgical site wound dehiscence; also noted with patellar tendon rupture.  Pt s/p R total knee revision and I&D 8/29.  PMH includes alcohol abuse, anxiety, h/o back injury, Charcot-Marie-Tooth disease, COPD, Hepatitis C, htn, thrombocytopenia.    PT Comments    Pt was supine in bed upon arriving. She was premedicated prior to session and is cooperative and willing to participate. " I'm getting pissed off and just want to go home." Therapist discussed safety concerns with pt and need to improve safe functional mobility to be able to safely go home. Lengthy discussion about barriers to [place] home. She was able to exit bed without assistance, stand to RW with supervision, and ambulated ~ 80 ft with RW+ w/c follow and CGA.Continues to be impulsive at time. Becomes lethargic/distracted as session progressed (suspect due to meds).  Pt has had no SNF bed offers and will likely be D/C ing home. Has stair to enter home. Pt was wheeled to rehab gym and performed stair training. Unsafe performance with stairs. Reached out to pt's spouse and discussed DC disposition. Discussed with husband that pt is improving but will benefit from a ramp entry at home. He reports he will be able to have ramp built this Saturday (03/02/20). [name] has WC already and all DME needs met. Overall pt would benefit from SNF at DC but CM/SW having placement issues. She will benefit from continued skilled PT at DC to address deficits and improve safe functional abilities.     Follow Up Recommendations  Supervision/Assistance - 24 hour;Supervision for mobility/OOB;SNF     Equipment Recommendations  None recommended by PT    Recommendations for Other Services       Precautions / Restrictions Precautions Precautions: Fall Precaution Booklet Issued: No Precaution Comments:  (Impulsive at times) Restrictions Weight Bearing Restrictions: Yes Other Position/Activity Restrictions: R AKA    Mobility  Bed Mobility Overal bed mobility: Modified Independent      Transfers Overall transfer level: Needs assistance Equipment used: Rolling walker (2 wheeled) Transfers: Sit to/from Stand Sit to Stand: Supervision Stand pivot transfers: Min guard Squat pivot transfers: Min guard;Supervision     General transfer comment: close supervision for safety.   Ambulation/Gait Ambulation/Gait assistance: Min guard   Assistive device: Rolling walker (2 wheeled) Gait Pattern/deviations:  (" hop to.") Gait velocity: WNL   General Gait Details: pt was able to ambulate with CGA (for safety) x ~80 ft with RW + w/c follow. Therapist elected to push pt the rest of way to rehab gym for stair training.    Stairs   Stairs assistance: Mod assist Stair Management: Two rails;Backwards;Seated/boosting   General stair comments: Pt was willing to trial stair navigation. unwilling to try hopping up stairs fwd so attempted boosting on bottom. She required mod assist to safely sit on steps with eccnetric control. did not attempt to stand at Top of stairs for safety. pt's was unsafe to trial. require mod assist to stand from steps to RW with BUE support on rails.   Wheelchair Mobility  Modified Rankin (Stroke Patients Only)       Balance Overall balance assessment: Needs assistance Sitting-balance support: Feet supported Sitting balance-Leahy Scale: Good Sitting balance - Comments: pt was able to maintain sitting balance EOB without assistance   Standing balance  support: Bilateral upper extremity supported;During functional activity Standing balance-Leahy Scale: Good Standing balance comment: reliant on UE support for balance       Cognition Arousal/Alertness: Awake/alert;Suspect due to medications;Lethargic (becomes lethargic during session ) Behavior During Therapy: WFL for tasks assessed/performed;Impulsive Overall Cognitive Status: Difficult to assess        General Comments: Pt was A and O x 4 but during session cognition/alertness decreased throughout. Pt jhas very poor safety awareness and poor insight of deficits.      Exercises Other Exercises Other Exercises: Pt educated on residual limb desensitization techniques including gentle self massage, gentle tapping, and importance of monitoring for signs of infection and keeping wound/incision site clean. Other Exercises: OT facilitates seated UB grooming and bathing tasks, toilet transfer, toileting, with safety education and re-direction provided t/o session. See ADL section for additional detail.    General Comments General comments (skin integrity, edema, etc.): RLE wound bandages intact at start/end of session.      Pertinent Vitals/Pain Pain Assessment: No/denies pain Faces Pain Scale: Hurts even more Pain Location: RLE Pain Descriptors / Indicators: Burning;Discomfort;Moaning;Grimacing;Guarding           PT Goals (current goals can now be found in the care plan section) Acute Rehab PT Goals Patient Stated Goal: go home    Frequency    7X/week      PT Plan Current plan remains appropriate    AM-PAC PT "6 Clicks" Mobility   Outcome Measure  Help needed turning from your back to your side while in a flat bed without using bedrails?: None Help needed moving from lying on your back to sitting on the side of a flat bed without using bedrails?: A Little Help needed moving to and from a bed to a chair (including a wheelchair)?: A Little Help needed standing up from a  chair using your arms (e.g., wheelchair or bedside chair)?: A Little Help needed to walk in hospital room?: A Little Help needed climbing 3-5 steps with a railing? : A Lot 6 Click Score: 18    End of Session Equipment Utilized During Treatment: Gait belt Activity Tolerance: Patient limited by lethargy;Patient tolerated treatment well;Other (comment) (unable to stay focused 2/2 to meds?) Patient left:  (In w/c with OT in room to treat) Nurse Communication: Mobility status PT Visit Diagnosis: Unsteadiness on feet (R26.81);Other abnormalities of gait and mobility (R26.89);Muscle weakness (generalized) (M62.81);Difficulty in walking, not elsewhere classified (R26.2);Pain Pain - Right/Left: Right Pain - part of body: Leg     Time:  -     Charges:                       Julaine Fusi PTA 02/28/20, 1:34 PM

## 2020-02-29 LAB — CBC WITH DIFFERENTIAL/PLATELET
Abs Immature Granulocytes: 0.01 10*3/uL (ref 0.00–0.07)
Basophils Absolute: 0 10*3/uL (ref 0.0–0.1)
Basophils Relative: 1 %
Eosinophils Absolute: 0.2 10*3/uL (ref 0.0–0.5)
Eosinophils Relative: 4 %
HCT: 27.6 % — ABNORMAL LOW (ref 36.0–46.0)
Hemoglobin: 8.7 g/dL — ABNORMAL LOW (ref 12.0–15.0)
Immature Granulocytes: 0 %
Lymphocytes Relative: 22 %
Lymphs Abs: 0.9 10*3/uL (ref 0.7–4.0)
MCH: 27.8 pg (ref 26.0–34.0)
MCHC: 31.5 g/dL (ref 30.0–36.0)
MCV: 88.2 fL (ref 80.0–100.0)
Monocytes Absolute: 0.5 10*3/uL (ref 0.1–1.0)
Monocytes Relative: 11 %
Neutro Abs: 2.7 10*3/uL (ref 1.7–7.7)
Neutrophils Relative %: 62 %
Platelets: 134 10*3/uL — ABNORMAL LOW (ref 150–400)
RBC: 3.13 MIL/uL — ABNORMAL LOW (ref 3.87–5.11)
RDW: 17.6 % — ABNORMAL HIGH (ref 11.5–15.5)
WBC: 4.4 10*3/uL (ref 4.0–10.5)
nRBC: 0 % (ref 0.0–0.2)

## 2020-02-29 LAB — COMPREHENSIVE METABOLIC PANEL
ALT: 20 U/L (ref 0–44)
AST: 37 U/L (ref 15–41)
Albumin: 2.6 g/dL — ABNORMAL LOW (ref 3.5–5.0)
Alkaline Phosphatase: 120 U/L (ref 38–126)
Anion gap: 8 (ref 5–15)
BUN: 9 mg/dL (ref 6–20)
CO2: 25 mmol/L (ref 22–32)
Calcium: 8.6 mg/dL — ABNORMAL LOW (ref 8.9–10.3)
Chloride: 106 mmol/L (ref 98–111)
Creatinine, Ser: 0.41 mg/dL — ABNORMAL LOW (ref 0.44–1.00)
GFR calc Af Amer: >60 mL/min (ref >60)
GFR calc non Af Amer: >60 mL/min (ref >60)
Glucose, Bld: 108 mg/dL — ABNORMAL HIGH (ref 70–99)
Potassium: 3.6 mmol/L (ref 3.5–5.1)
Sodium: 139 mmol/L (ref 135–145)
Total Bilirubin: 0.8 mg/dL (ref 0.3–1.2)
Total Protein: 6.5 g/dL (ref 6.5–8.1)

## 2020-02-29 LAB — PHOSPHORUS: Phosphorus: 3.9 mg/dL (ref 2.5–4.6)

## 2020-02-29 LAB — MAGNESIUM: Magnesium: 2 mg/dL (ref 1.7–2.4)

## 2020-02-29 MED ORDER — ENSURE MAX PROTEIN PO LIQD
11.0000 [oz_av] | Freq: Every day | ORAL | Status: DC
  Administered 2020-02-29 – 2020-03-04 (×4): 11 [oz_av] via ORAL
  Filled 2020-02-29: qty 330

## 2020-02-29 NOTE — TOC Progression Note (Signed)
Transition of Care [facility]) - Progression Note    Patient Details  Name: Rhonda Martin MRN: [id number] Date of Birth: 01-May-1976  [facility]) CM/SW [place], RN Phone Number: 02/29/2020, 4:02 PM  Clinical Narrative:  RNCM attempted to meet with patient in room however patient sleeping and did not rouse easily. RNCM will try back next date. Patient's husband is working towards getting ramp built and plans on taking patient home if and skilled facility can not be obtained.     Expected Discharge Plan: Skilled Nursing Facility Barriers to Discharge: Continued Medical Work up  Expected Discharge Plan and Services Expected Discharge Plan: [place]       Living arrangements for the past 2 months: Single Family Home                                       Social Determinants of Health (SDOH) Interventions    Readmission Risk Interventions Readmission Risk Prevention Plan 02/19/2020 11/29/2019 10/25/2019  Post Dischage Appt - - -  Medication Screening - - -  Transportation Screening Complete Complete Complete  PCP or Specialist Appt within 3-5 Days - - Complete  HRI or Home Care Consult - - Complete  Medication Review ([place]) Complete Complete Complete  PCP or Specialist appointment within 3-5 days of discharge Complete Complete -  [place] or Home Care Consult Complete - -  SW Recovery Care/Counseling Consult Complete - -  Palliative Care Screening Not Applicable Not Applicable -  [place] Complete Not Applicable -

## 2020-02-29 NOTE — Progress Notes (Signed)
Initial Nutrition Assessment  DOCUMENTATION CODES:   Not applicable  INTERVENTION:   Ensure Max protein supplement daily, each supplement provides 150kcal and 30g of protein.  MVI daily   NUTRITION DIAGNOSIS:   Increased nutrient needs related to post-op healing as evidenced by increased estimated needs.  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  RD working remotely.  Unable to reach pt by phone. Per chart review, pt eating 50-100% of meals in hospital. RD will add supplements to help pt meet her estimated needs and support post-op healing. Per chart, pt appears fairly weight stable at baseline. Pt has not been weighed since admit; will request weekly weights.   Medications reviewed and include: lovenox, ferrous sulfate, lasix, lactulose, Mg oxide, MVI, protonix, senokot, aldactone, carafate, B12  Labs reviewed: creat 0.41(L), K 3.6 wnl, P 3.9 wnl, Mg 2.0 wnl Hgb 8.7(L), Hct 27.6(L)  NUTRITION - FOCUSED PHYSICAL EXAM: Unable to perform at this time  Diet Order:   Diet Order            Diet regular Room service appropriate? Yes; Fluid consistency: Thin  Diet effective now                EDUCATION NEEDS:   Education needs have been addressed  Skin:  Skin Assessment: Reviewed RN Assessment (ecchymosis, incision R leg)  Last BM:  9/21- type 2  Height:   Ht Readings from Last 1 Encounters:  02/14/20  5\' 2"  (1.575 m)    Weight:   Wt Readings from Last 1 Encounters:  02/14/20 60.8 kg    Ideal Body Weight:  50 kg  BMI:  Body mass index is 24.51 kg/m.  Estimated Nutritional Needs:   Kcal:  1500-1700kcal/day  Protein:  75-85g/day  Fluid:  1.5-1.8L/day  Koleen Distance MS, RD, LDN Please refer to [facility] for RD and/or RD on-call/weekend/after hours pager

## 2020-02-29 NOTE — Plan of Care (Signed)
  Problem: Health Behavior/Discharge Planning: Goal: Ability to manage health-related needs will improve Outcome: Progressing   Problem: Clinical Measurements: Goal: Ability to maintain clinical measurements within normal limits will improve Outcome: Progressing Goal: Will remain free from infection Outcome: Progressing Goal: Diagnostic test results will improve Outcome: Progressing Goal: Respiratory complications will improve Outcome: Progressing Goal: Cardiovascular complication will be avoided Outcome: Progressing   Problem: Activity: Goal: Risk for activity intolerance will decrease Outcome: Progressing   Problem: Nutrition: Goal: Adequate nutrition will be maintained Outcome: Progressing   Problem: Coping: Goal: Level of anxiety will decrease Outcome: Progressing   Problem: Elimination: Goal: Will not experience complications related to bowel motility Outcome: Progressing Goal: Will not experience complications related to urinary retention Outcome: Progressing   Problem: Elimination: Goal: Will not experience complications related to urinary retention Outcome: Progressing   Problem: Safety: Goal: Ability to remain free from injury will improve Outcome: Progressing   Problem: Pain Managment: Goal: General experience of comfort will improve Outcome: Progressing   Problem: Self-Care: Goal: Ability to meet self-care needs will improve Outcome: Progressing   Problem: Self-Concept: Goal: Ability to maintain and perform role responsibilities to the fullest extent possible will improve Outcome: Progressing   Problem: Skin Integrity: Goal: Demonstration of wound healing without infection will improve Outcome: Progressing

## 2020-02-29 NOTE — Progress Notes (Signed)
PT Cancellation Note  Patient Details Name: Rhonda Martin MRN: [id number] DOB: Mar 03, 1976   Cancelled Treatment:    Reason Eval/Treat Not Completed: Pain limiting ability to participate;Other (comment)   Per rehab tech this AM, step-daughter stated pt would not be participating in therapy today and did not allow chair set-up.  Checked with pt later this am and pt in bed, weeping.  Stated she was upset that her aunt passed of Covid and the funeral is today.  She stated she is "no myself" and has "no energy."  C/o feeling generally light headed and is scared of mobility.  Encouragement given.  She continued to decline therapy attempts.   [name] 02/29/2020, 2:16 PM

## 2020-02-29 NOTE — Progress Notes (Signed)
PROGRESS NOTE    Rhonda Martin  VPX:[id number] DOB: Dec 05, 1975 DOA: 02/14/2020 PCP: Volney American, PA-C  Brief Narrative:  The patient is a 44 year old Caucasian female with a past medical history significant for but not limited to Charcot-Marie-Tooth disease, alcohol abuse, anxiety and depression, hepatitis C, history of narcotic and polysubstance abuse, liver cirrhosis, chronic pain syndrome as well as other comorbidities who underwent a right total knee arthroplasty on 09/11/2701 that was complicated by wound dehiscence and bleeding for which that she had a total right knee revision on 02/04/2020 by Dr. Rudene Christians.  At that time she was apparently found to have osteomyelitis subsequently and presented with acute onset of suspect dislocation and worsening swelling tenderness and pain of her right knee.  She is found to have a ruptured patella tendon at that time was discharged on 02/09/2020 after being hospitalized for sepsis secondary right knee wound infection after right recent total knee arthroplasty and medical noncompliance.  During that time IV had recommend on discharge the patient be discharged on doxycycline 100 mg twice a day for 6 weeks as well as oral fluconazole for 6 weeks.  She subsequently presented to the hospital again and was hospitalized on 02/15/2020 and since she feels her right total knee replacement vascular surgery was consulted by Dr. 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  PT OT evaluated and recommending SNF discharge however currently there is no bed available and she is unsafe to discharge home as she is still pending.  Husband is going to build a ramp for the patient and she can likely be discharged once cleared by PT OT.  Today she is much more awake and alert however she is feeling depressed given that one of her family members died from [place].  Also complained of some pain.  Hopeful that she can have her husband build a ramp so she can go home.  She  wanted to speak with the transitional care team today.  Assessment & Plan:   Active Problems:   Weakness   Generalized anxiety disorder   HTN (hypertension)   Osteomyelitis of right lower extremity (HCC)   Ischemic chest pain (HCC)  SIRS with Low-grade fever/tachycardic (on 02/26/2020); Admission Sepsis Resolved  -Resolved -Hemodynamically stable, labs within normal limits no signs of infection. -Denies of having any dysuria shortness of breath or chest pain, denies any cough or congestion. -WBC 7.4 then and not repeated, lactic acid 1.4 with repeat of 1.5, procalcitonin <0.10 -Chest x-ray reviewed, improved from the last chest x-ray, finding consistent with improving pneumonia versus edema -Repeat WBC this AM was 4.4 -We will abstain from any antibiotics use at this time -Continue to Monitor Closely   Failed right total knee replacement: with associated right lower extremity osteomyelitis and severe nonpurulent knee cellulitis.  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 Surgical path showed "RIGHT LEG, ABOVE KNEE AMPUTATION:  - SUBCUTANEOUS TISSUE AND SKELETAL MUSCLE WITH ABSCESS AND NECROSIS.  - CHRONIC OSTEOMYELITIS WITH BONE REMODELING.  - NEGATIVE FOR MALIGNANCY.  - CHRONIC INFLAMMATORY CHANGES ARE PRESENT WITHIN BONE AT THE PROXIMAL  RESECTION MARGIN. " -Previous knee cultures grew stap epi and candidia parapsilosis. W/ wound dehiscence, noncompliance w/ medication and alcohol/drug abuse, ortho surg felt the infection will not resolve so plan was for a R AKA. 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  -Continue on oxycodone, gabapentin, oxycontin as pt has severe pain.  -Continues to complain  of pain but has been on chronic pain medication appears comfortable today  Hyponatremia -Patient's Na+ was 133 and repeat was 139 -Repeat CMP intermittently  HTN -Last blood pressure was 130/63 -Continue monitoring,  -Continue  home medication Metoprolol Succinate 25 mg po Daily and Furosemide 20 mg po daily and with Spironolactone 25 mg po Daily  -No longer on as needed Hydralazine  Hypokalemia -Improved and now K+ 3.6 -Continue to monitor intermittently and replete as necessary -Repeat CMP intermittently and will check in the AM  GERD -Continue Pantoprazole 40 mg po Daily  -C/w Sucralfate 1 gram po TIDwm and qHS  Peripheral Neuropathy:  -Stable, continue home dose Gabapentin 400 mg po TID -Slowly tapering down narcotics  Acute on chronic pain syndrome -Again We will make no changes to medications today but her request to go up on her pain medication -The day before today Dr. Jamesetta Geralds had an extensive discussion regarding pain management was held with patient she expressed understanding seem to be cooperative -Current Pain Regimen: OxyIR from 5 mg every 6 hours as needed, continue Ultram 50 mg every 6 hours as needed, Gabapentin 400 mg p.o. 3 times daily  Depression and Anxiety -C/w Duloxetine 30 mg p.o. daily, Hydroxyzine 25 mg p.o. 3 times daily as needed anxiety, Olanzapine 10 mg p.o. nightly,  Cirrhosis and Hx of Hepatitis C -LFTs stable and no signs of asterixis -Last Ammonia was 43 on 9/14/2 -Did have some drowsiness today but was not noted to have encephalopathy,  -Continue Lactulose 30 grams TID, Furosemide 20 mg po Daily and Spironolactone 25 mg po Daily   Thrombocytopenia -Patient's platelet count has now improved and stabilized on last 3 checks the last check being on 02/26/2020 but today it was slightly lower and went from 176,000 and is now 134,000 -No signs and symptoms of bleeding -Repeat CBC in the AM   Abnormal LFTs/Transaminitis -Likely secondary to cirrhosis. AST is labile but is now improved and is 37 -Repeat CMP intermittently   Charcot-Marie Tooth Disease -Continue w/ supportive care   Normocytic Anemia -Patient's Hgb/Hct has been relatively stable with last check of  9.4/29.8 a few days ago but today is slightly lower at 8.7/27.6 -C/w Ferrous Sulfate 325 mg po Daily with Breakfast Check anemia panel in a.m. -Continue to monitor for signs and symptoms of bleeding; currently no overt bleeding noted and does not have any drainage around her stump site. -Continue to Monitor for S/Sx of Bleeding; Currently no overt bleeding noted -Repeat CBC in the AM   DVT prophylaxis: Enoxaparin 40 mg subcu every 24h  Code Status: FULL CODE  Family Communication: No family present at bedside  Disposition Plan: SNF; If Ramp can get built for home and she has IT trainer then can likely discharge Home with [place] but continues to require SNF Level of Care  Status is: Inpatient  Remains inpatient appropriate because:Unsafe d/c plan and Inpatient level of care appropriate due to severity of illness   Dispo: The patient is from: Home              Anticipated d/c is to: SNF              Anticipated d/c date is: 3 days              Patient currently is medically stable to d/c.  Consultants:   Orthopedic Surgery  Vascular Surgery  Infectious Diseases   Procedures:  Right above-the-knee amputation on 02/16/2020 done by Dr. Leotis Pain  Antimicrobials: Anti-infectives (From admission, onward)   Start     Dose/Rate Route Frequency Ordered Stop   02/18/20 2200  linezolid (ZYVOX) IVPB 600 mg  Status:  Discontinued        600 mg 300 mL/hr over 60 Minutes Intravenous Every 12 hours 02/18/20 1530 02/19/20 1502   02/16/20 1038  vancomycin (VANCOCIN) 1-5 GM/200ML-% IVPB       Note to Pharmacy: Leonia Reader   : cabinet override      02/16/20 1038 02/16/20 2244   02/16/20 0600  ceFAZolin (ANCEF) IVPB 2g/100 mL premix  Status:  Discontinued        2 g 200 mL/hr over 30 Minutes Intravenous On call to O.R. 02/15/20 2156 02/16/20 1411   02/15/20 1300  vancomycin (VANCOCIN) IVPB 1000 mg/200 mL premix  Status:  Discontinued        1,000 mg 200 mL/hr over 60 Minutes  Intravenous Every 12 hours 02/15/20 0244 02/18/20 1530   02/15/20 1000  fluconazole (DIFLUCAN) tablet 400 mg  Status:  Discontinued        400 mg Oral Daily 02/15/20 0103 02/19/20 1502   02/15/20 0600  piperacillin-tazobactam (ZOSYN) IVPB 3.375 g  Status:  Discontinued        3.375 g 12.5 mL/hr over 240 Minutes Intravenous Every 8 hours 02/15/20 0111 02/19/20 1502   02/15/20 0115  piperacillin-tazobactam (ZOSYN) IVPB 3.375 g  Status:  Discontinued        3.375 g 100 mL/hr over 30 Minutes Intravenous  Once 02/15/20 0103 02/15/20 0111   02/15/20 0115  vancomycin (VANCOCIN) IVPB 1000 mg/200 mL premix  Status:  Discontinued        1,000 mg 200 mL/hr over 60 Minutes Intravenous  Once 02/15/20 0103 02/15/20 0111   02/15/20 0115  vancomycin (VANCOREADY) IVPB 500 mg/100 mL        500 mg 100 mL/hr over 60 Minutes Intravenous  Once 02/15/20 0111 02/15/20 0417   02/14/20 2330  vancomycin (VANCOCIN) IVPB 1000 mg/200 mL premix        1,000 mg 200 mL/hr over 60 Minutes Intravenous  Once 02/14/20 2328 02/15/20 0235   02/14/20 2330  piperacillin-tazobactam (ZOSYN) IVPB 3.375 g        3.375 g 100 mL/hr over 30 Minutes Intravenous  Once 02/14/20 2328 02/15/20 0051        Subjective: Seen and examined at bedside and she is much more awake and alert today but felt sad that one of her family members died of Covid.  No nausea or vomiting but asking for increase in her pain medication given her pain despite appearing comfortable.  States that she wanted to speak with the transition of care team and find out the update about her significant progress.  No lightheadedness or dizziness.  States that she feels much better than she has been and feels that she is making progress but just feels a little depressed since that has not able to make the family members funeral.  No other concerns or complaints at this time.  Objective: Vitals:   02/28/20 2332 02/29/20 0438 02/29/20 0724 02/29/20 1136  BP: 138/61 125/61  127/78 130/63  Pulse: 82 (!) 129 (!) 110 (!) 109  Resp: 14 20 16 16   Temp: 99.5 F (37.5 C) 100.1 F (37.8 C) 98.9 F (37.2 C) 98.6 F (37 C)  TempSrc: Oral Oral  Oral  SpO2: 100% 95% 99% 97%  Weight:      Height:  Intake/Output Summary (Last 24 hours) at 02/29/2020 1503 Last data filed at 02/29/2020 0955 Gross per 24 hour  Intake --  Output 500 ml  Net -500 ml   Filed Weights   02/14/20 1722  Weight: 60.8 kg   Examination: Physical Exam:  Constitutional: Thin Caucasian female currently in NAD and appears calm and comfortable Eyes: Lids and conjunctivae normal, sclerae anicteric  ENMT: External Ears, Nose appear normal. Grossly normal hearing. No JVD Neck: Appears normal, supple, no cervical masses, normal ROM, no appreciable thyromegaly Respiratory: Diminished to auscultation bilaterally, no wheezing, rales, rhonchi or crackles. Normal respiratory effort and patient is not tachypenic. No accessory muscle use. Unlabored Breathing Cardiovascular: RRR, no murmurs / rubs / gallops. S1 and S2 auscultated.  Abdomen: Soft, non-tender, non-distended. Bowel sounds positive.  GU: Deferred. Musculoskeletal: No clubbing / cyanosis of digits/nails. Has a Right AKA  Skin: No rashes, lesions, ulcers on a limited skin evaluation. No induration; Warm and dry.  Neurologic: CN 2-12 grossly intact with no focal deficits. Romberg sign and cerebellar reflexes not assessed.  Psychiatric: Normal judgment and insight. Alert and oriented x 3. Normal mood and appropriate affect.   Data Reviewed: I have personally reviewed following labs and imaging studies  CBC: Recent Labs  Lab 02/26/20 0919 02/29/20 0614  WBC 7.4 4.4  NEUTROABS 6.1 2.7  HGB 9.4* 8.7*  HCT 29.8* 27.6*  MCV 86.4 88.2  PLT 176 782*   Basic Metabolic Panel: Recent Labs  Lab 02/26/20 0919 02/29/20 0614  NA 133* 139  K 4.0 3.6  CL 101 106  CO2 24 25  GLUCOSE 125* 108*  BUN 11 9  CREATININE 0.42* 0.41*   CALCIUM 8.6* 8.6*  MG  --  2.0  PHOS  --  3.9   GFR: Estimated Creatinine Clearance: 77.1 mL/min (A) (by C-G formula based on SCr of 0.41 mg/dL (L)). Liver Function Tests: Recent Labs  Lab 02/26/20 0919 02/29/20 0614  AST 39 37  ALT 24 20  ALKPHOS 92 120  BILITOT 0.7 0.8  PROT 6.6 6.5  ALBUMIN 2.7* 2.6*   No results for input(s): LIPASE, AMYLASE in the last 168 hours. No results for input(s): AMMONIA in the last 168 hours. Coagulation Profile: No results for input(s): INR, PROTIME in the last 168 hours. Cardiac Enzymes: No results for input(s): CKTOTAL, CKMB, CKMBINDEX, TROPONINI in the last 168 hours. BNP (last 3 results) No results for input(s): PROBNP in the last 8760 hours. HbA1C: No results for input(s): HGBA1C in the last 72 hours. CBG: No results for input(s): GLUCAP in the last 168 hours. Lipid Profile: No results for input(s): CHOL, HDL, LDLCALC, TRIG, CHOLHDL, LDLDIRECT in the last 72 hours. Thyroid Function Tests: No results for input(s): TSH, T4TOTAL, FREET4, T3FREE, THYROIDAB in the last 72 hours. Anemia Panel: No results for input(s): VITAMINB12, FOLATE, FERRITIN, TIBC, IRON, RETICCTPCT in the last 72 hours. Sepsis Labs: Recent Labs  Lab 02/26/20 0919 02/26/20 1136  PROCALCITON <0.10  --   LATICACIDVEN 1.4 1.5    No results found for this or any previous visit (from the past 240 hour(s)).   RN Pressure Injury Documentation:     Estimated body mass index is 24.51 kg/m as calculated from the following:   Height as of this encounter: 5\' 2"  (1.575 m).   Weight as of this encounter: 60.8 kg.  Malnutrition Type:  Nutrition Problem: Increased nutrient needs Etiology: post-op healing   Malnutrition Characteristics:  Signs/Symptoms: estimated needs   Nutrition Interventions:  Radiology Studies: No results found.  Scheduled Meds: . Chlorhexidine Gluconate Cloth  6 each Topical Once  . dicyclomine  20 mg Oral TID AC & HS  . DULoxetine   30 mg Oral Daily  . enoxaparin (LOVENOX) injection  40 mg Subcutaneous Q24H  . ferrous sulfate  325 mg Oral Q breakfast  . furosemide  20 mg Oral Daily  . gabapentin  400 mg Oral TID  . lactulose  30 g Oral TID  . magnesium oxide  400 mg Oral Daily  . metoprolol succinate  25 mg Oral Daily  . multivitamin with minerals  1 tablet Oral Daily  . OLANZapine  10 mg Oral QHS  . oxyCODONE  10 mg Oral Q12H  . pantoprazole  40 mg Oral Daily  . Ensure Max Protein  11 oz Oral Daily  . senna  1 tablet Oral BID  . spironolactone  25 mg Oral Daily  . sucralfate  1 g Oral TID WC & HS  . vitamin B-12  500 mcg Oral Daily   Continuous Infusions: . sodium chloride Stopped (02/22/20 2210)    LOS: 15 days    Kerney Elbe, DO [facility] PAGER is on AMION  If 7PM-7AM, please contact night-coverage [email or web address]

## 2020-03-01 LAB — CBC WITH DIFFERENTIAL/PLATELET
Abs Immature Granulocytes: 0.01 10*3/uL (ref 0.00–0.07)
Basophils Absolute: 0.1 10*3/uL (ref 0.0–0.1)
Basophils Relative: 2 %
Eosinophils Absolute: 0.2 10*3/uL (ref 0.0–0.5)
Eosinophils Relative: 5 %
HCT: 27.5 % — ABNORMAL LOW (ref 36.0–46.0)
Hemoglobin: 9 g/dL — ABNORMAL LOW (ref 12.0–15.0)
Immature Granulocytes: 0 %
Lymphocytes Relative: 23 %
Lymphs Abs: 0.9 10*3/uL (ref 0.7–4.0)
MCH: 28.3 pg (ref 26.0–34.0)
MCHC: 32.7 g/dL (ref 30.0–36.0)
MCV: 86.5 fL (ref 80.0–100.0)
Monocytes Absolute: 0.6 10*3/uL (ref 0.1–1.0)
Monocytes Relative: 14 %
Neutro Abs: 2.3 10*3/uL (ref 1.7–7.7)
Neutrophils Relative %: 56 %
Platelets: 141 10*3/uL — ABNORMAL LOW (ref 150–400)
RBC: 3.18 MIL/uL — ABNORMAL LOW (ref 3.87–5.11)
RDW: 17.7 % — ABNORMAL HIGH (ref 11.5–15.5)
WBC: 4 10*3/uL (ref 4.0–10.5)
nRBC: 0 % (ref 0.0–0.2)

## 2020-03-01 MED ORDER — KETOROLAC TROMETHAMINE 30 MG/ML IJ SOLN
30.0000 mg | Freq: Once | INTRAMUSCULAR | Status: AC
  Administered 2020-03-01: 30 mg via INTRAVENOUS
  Filled 2020-03-01: qty 1

## 2020-03-01 NOTE — TOC Progression Note (Addendum)
Transition of Care [facility]) - Progression Note    Patient Details  Name: Rhonda Martin MRN: [id number] Date of Birth: January 27, 1976  Transition of [facility]) CM/SW [place], RN Phone Number: 03/01/2020, 10:46 AM  Clinical Narrative:   RNCM met with patient at bedside, patient sitting up in chair reports to feeling better today. Patient verbalizes understanding that skilled nursing is not an option at this point and feels ok about going home, she reports that her husband is working on a ramp.   11am-  Per attending MD patient will not discharge prior to ramp being built due to unsafe discharge plan. Per patient this will likely happen over the weekend.      Expected Discharge Plan: Skilled Nursing Facility Barriers to Discharge: Continued Medical Work up  Expected Discharge Plan and Services Expected Discharge Plan: [place]       Living arrangements for the past 2 months: Single Family Home                                       Social Determinants of Health (SDOH) Interventions    Readmission Risk Interventions Readmission Risk Prevention Plan 02/19/2020 11/29/2019 10/25/2019  Post Dischage Appt - - -  Medication Screening - - -  Transportation Screening Complete Complete Complete  PCP or Specialist Appt within 3-5 Days - - Complete  HRI or Home Care Consult - - Complete  Medication Review ([place]) Complete Complete Complete  PCP or Specialist appointment within 3-5 days of discharge Complete Complete -  [place] or Home Care Consult Complete - -  SW Recovery Care/Counseling Consult Complete - -  Palliative Care Screening Not Applicable Not Applicable -  [place] Complete Not Applicable -

## 2020-03-01 NOTE — Progress Notes (Signed)
Physical Therapy Treatment Patient Details Name: Rhonda Martin MRN: [id number] DOB: [birth year] Today's Date: 03/01/2020    History of Present Illness Pt is a 44 y.o. female presenting to hospital 8/28 with confusion and R knee pain.  Pt with recent admit 8/5-8/10 for R TKA (Dr. Rudene Christians).  New imaging of R knee showing apparent fx fragment adjacent to lateral aspect of proximal tibia; subtle fx line extending through the tibial metaphysis.  Pt now admitted with sepsis secondary surgical site wound dehiscence; also noted with patellar tendon rupture.  Pt s/p R total knee revision and I&D 8/29.  PMH includes alcohol abuse, anxiety, h/o back injury, Charcot-Marie-Tooth disease, COPD, Hepatitis C, htn, thrombocytopenia.    PT Comments    Pt was sitting up in bed upon arriving. She is Alert  And oriented. Just given pain meds prior to session. She was able to exit bed without physical assistance or cues. Stood to [employer] and ambulated one lap around [employer] station. She has unsteadiness with gait but mostly only required supervision. Did require 1 seated rest. W/c follow during gait for safety and due to fatigue. Continues to have elevated HR but overall tolerated well. Pt reports husband is working on ramp for home entry. She tolerated session well. Will benefit form continued PT at DC to address deficits and improve safety.    Follow Up Recommendations  Supervision/Assistance - 24 hour;Supervision for mobility/OOB;SNF     Equipment Recommendations  None recommended by PT    Recommendations for Other Services       Precautions / Restrictions Precautions Precautions: Fall Precaution Booklet Issued: No Precaution Comments: Moderate fall Restrictions Weight Bearing Restrictions: Yes RLE Weight Bearing: Non weight bearing    Mobility  Bed Mobility Overal bed mobility: Modified Independent     Transfers Overall transfer level: Modified independent       Ambulation/Gait Ambulation/Gait assistance:  Min guard;Supervision Gait Distance (Feet): 160 Feet Assistive device: Rolling walker (2 wheeled)   Gait velocity: WNL   General Gait Details: Pt was able to ambulate one lap around RN station with RW + 1 seated rest. w/c follwo for safety. mostly required supervision but has occasional episodes of unsteadiness requireing CGA for safety. pt able to correct without therapist intervention.      Balance Overall balance assessment: Needs assistance Sitting-balance support: Feet supported Sitting balance-Leahy Scale: Good Sitting balance - Comments: no LOB while seated EOB   Standing balance support: Bilateral upper extremity supported;During functional activity Standing balance-Leahy Scale: Fair Standing balance comment: reliant on UE support for balance             Cognition Arousal/Alertness: Awake/alert Behavior During Therapy: WFL for tasks assessed/performed;Impulsive Overall Cognitive Status: Within Functional Limits for tasks assessed        General Comments: Pt si A and able to follow commands throughout without difficulty             Pertinent Vitals/Pain Pain Assessment: 0-10 Pain Score: 5  Faces Pain Scale: Hurts a little bit Pain Location: RLE Pain Descriptors / Indicators: Burning;Discomfort;Moaning;Grimacing;Guarding Pain Intervention(s): Limited activity within patient's tolerance;Monitored during session;Premedicated before session;Repositioned           PT Goals (current goals can now be found in the care plan section) Acute Rehab PT Goals Patient Stated Goal: go home Progress towards PT goals: Progressing toward goals    Frequency    7X/week      PT Plan Current plan remains appropriate    Co-evaluation  PT goals addressed during session: Mobility/safety with mobility;Proper use of DME;Strengthening/ROM;Balance        AM-PAC PT "6 Clicks" Mobility   Outcome Measure  Help needed turning from your back to your side while in a  flat bed without using bedrails?: None Help needed moving from lying on your back to sitting on the side of a flat bed without using bedrails?: A Little Help needed moving to and from a bed to a chair (including a wheelchair)?: A Little Help needed standing up from a chair using your arms (e.g., wheelchair or bedside chair)?: A Little Help needed to walk in hospital room?: A Little Help needed climbing 3-5 steps with a railing? : A Little 6 Click Score: 19    End of Session Equipment Utilized During Treatment: Gait belt Activity Tolerance: Patient tolerated treatment well Patient left: in chair;with call bell/phone within reach;with chair alarm set Nurse Communication: Mobility status PT Visit Diagnosis: Unsteadiness on feet (R26.81);Other abnormalities of gait and mobility (R26.89);Muscle weakness (generalized) (M62.81);Difficulty in walking, not elsewhere classified (R26.2);Pain Pain - Right/Left: Right Pain - part of body: Leg     Time: 1121-6244 PT Time Calculation (min) (ACUTE ONLY): 23 min  Charges:  $Gait Training: 8-22 mins $Therapeutic Activity: 8-22 mins                     Julaine Fusi PTA 03/01/20, 10:42 AM

## 2020-03-01 NOTE — Progress Notes (Signed)
OT Cancellation Note  Patient Details Name: Rhonda Martin MRN: [id number] DOB: [birth year]   Cancelled Treatment:    Reason Eval/Treat Not Completed: Patient declined, no reason specified. Upon attempt, pt reporting she was on the phone w/ her husband and could not call him back because he phone was having issues. Declining therapy at this time but agreeable to OT re-attempt.  Jeni Salles, MPH, MS, OTR/L ascom [phone] 03/01/20, 10:52 AM

## 2020-03-01 NOTE — Progress Notes (Signed)
PROGRESS NOTE    Rhonda Martin  UKG:[id number] DOB: [birth year] DOA: 02/14/2020 PCP: Volney American, PA-C  Brief Narrative:  The patient is a 44 year old Caucasian female with a past medical history significant for but not limited to Charcot-Marie-Tooth disease, alcohol abuse, anxiety and depression, hepatitis C, history of narcotic and polysubstance abuse, liver cirrhosis, chronic pain syndrome as well as other comorbidities who underwent a right total knee arthroplasty on 12/11/2829 that was complicated by wound dehiscence and bleeding for which that she had a total right knee revision on 02/04/2020 by Dr. Rudene Christians.  At that time she was apparently found to have osteomyelitis subsequently and presented with acute onset of suspect dislocation and worsening swelling tenderness and pain of her right knee.  She is found to have a ruptured patella tendon at that time was discharged on 02/09/2020 after being hospitalized for sepsis secondary right knee wound infection after right recent total knee arthroplasty and medical noncompliance.  During that time IV had recommend on discharge the patient be discharged on doxycycline 100 mg twice a day for 6 weeks as well as oral fluconazole for 6 weeks.  She subsequently presented to the hospital again and was hospitalized on 02/15/2020 and since she feels her right total knee replacement vascular surgery was consulted by Dr. 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  PT OT evaluated and recommending SNF discharge however currently there is no bed available and she is unsafe to discharge home as she is still pending.  Husband is going to build a ramp for the patient and she can likely be discharged once cleared by PT OT and when she is worked with steroid therapy.  Currently the recommendation is still for SNF unless she can have the ramp built for access and stair training.  She states that her husband is close to finishing her up in a few  days.  Assessment & Plan:   Active Problems:   Weakness   Generalized anxiety disorder   HTN (hypertension)   Osteomyelitis of right lower extremity (HCC)   Ischemic chest pain (HCC)  SIRS with Low-grade fever/tachycardic (on 02/26/2020); Admission Sepsis Resolved  -Resolved -Hemodynamically stable, labs within normal limits no signs of infection. -Denies of having any dysuria shortness of breath or chest pain, denies any cough or congestion. -WBC 7.4 then and not repeated, lactic acid 1.4 with repeat of 1.5, procalcitonin <0.10 -Chest x-ray reviewed, improved from the last chest x-ray, finding consistent with improving pneumonia versus edema -Repeat WBC this AM was 4.0 this a.m. -We will abstain from any antibiotics use at this time -Continue to Monitor Closely   Failed right total knee replacement: with associated right lower extremity osteomyelitis and severe nonpurulent knee cellulitis.  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 Surgical path showed "RIGHT LEG, ABOVE KNEE AMPUTATION:  - SUBCUTANEOUS TISSUE AND SKELETAL MUSCLE WITH ABSCESS AND NECROSIS.  - CHRONIC OSTEOMYELITIS WITH BONE REMODELING.  - NEGATIVE FOR MALIGNANCY.  - CHRONIC INFLAMMATORY CHANGES ARE PRESENT WITHIN BONE AT THE PROXIMAL  RESECTION MARGIN. " -Previous knee cultures grew stap epi and candidia parapsilosis. W/ wound dehiscence, noncompliance w/ medication and alcohol/drug abuse, ortho surg felt the infection will not resolve so plan was for a R AKA. 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  -Continue on oxycodone, gabapentin, oxycontin as pt has severe pain.  -Continues to complain of pain but has been on chronic pain medication appears comfortable  today  Hyponatremia -Patient's Na+ was 133 and repeat was 139 -Repeat CMP intermittently  HTN -Last blood pressure was 118/64 -Continue monitoring,  -Continue home medication Metoprolol Succinate 25  mg po Daily and Furosemide 20 mg po daily and with Spironolactone 25 mg po Daily  -No longer on as needed Hydralazine  Hypokalemia -Improved and now K+ 3.6 on last check -Continue to monitor intermittently and replete as necessary -Repeat CMP intermittently and will check in the AM  GERD -Continue Pantoprazole 40 mg po Daily  -C/w Sucralfate 1 gram po TIDwm and qHS  Peripheral Neuropathy:  -Stable, continue home dose Gabapentin 400 mg po TID -Slowly tapering down narcotics  Acute on chronic pain syndrome -Again We will make no changes to medications today but her request to go up on her pain medication -The day before today Dr. Jamesetta Geralds had an extensive discussion regarding pain management was held with patient she expressed understanding seem to be cooperative -Current Pain Regimen: OxyIR from 5 mg every 6 hours as needed, continue Ultram 50 mg every 6 hours as needed, Gabapentin 400 mg p.o. 3 times daily -Because she was still complaining of significant amount of pain we have given her a dose of IV ketorolac 30 mg x 1  Depression and Anxiety -C/w Duloxetine 30 mg p.o. daily, Hydroxyzine 25 mg p.o. 3 times daily as needed anxiety, Olanzapine 10 mg p.o. nightly,  Cirrhosis and Hx of Hepatitis C -LFTs stable and no signs of asterixis -Last Ammonia was 43 on 9/14/2 -Did have some drowsiness today but was not noted to have encephalopathy,  -Continue Lactulose 30 grams TID, Furosemide 20 mg po Daily and Spironolactone 25 mg po Daily   Thrombocytopenia -Patient's platelet count has now improved and stabilized on last 3 checks the last check being on 02/26/2020; Was a little lower 9/23 at 134,000 and is now 141,000 -No signs and symptoms of bleeding -Repeat CBC in the AM   Abnormal LFTs/Transaminitis -Likely secondary to cirrhosis. AST is labile but is now improved and is 37 -Repeat CMP intermittently   Charcot-Marie Tooth Disease -Continue w/ supportive care   Normocytic  Anemia -Patient's Hgb/Hct has been relatively stable and is now 9.0/27.5 -C/w Ferrous Sulfate 325 mg po Daily with Breakfast Check anemia panel in a.m. -Continue to monitor for signs and symptoms of bleeding; currently no overt bleeding noted and does not have any drainage around her stump site. -Continue to Monitor for S/Sx of Bleeding; Currently no overt bleeding noted -Repeat CBC in the AM   DVT prophylaxis: Enoxaparin 40 mg subcu every 24h  Code Status: FULL CODE  Family Communication: No family present at bedside  Disposition Plan: SNF currently has no bed offers available; If Ramp can get built for home and she has IT trainer then can likely discharge Home with Home Health but continues to require SNF Level of Care and cannot safely discharged until other bed is acquired at SNF or it is determined that she can do a home ramp  Status is: Inpatient  Remains inpatient appropriate because:Unsafe d/c plan and Inpatient level of care appropriate due to severity of illness   Dispo: The patient is from: Home              Anticipated d/c is to: SNF vs Home Health if               Anticipated d/c date is: 3 days  Patient currently is medically stable to d/c.  Consultants:   Orthopedic Surgery  Vascular Surgery  Infectious Diseases   Procedures:  Right above-the-knee amputation on 02/16/2020 done by Dr. Leotis Pain  Antimicrobials: Anti-infectives (From admission, onward)   Start     Dose/Rate Route Frequency Ordered Stop   02/18/20 2200  linezolid (ZYVOX) IVPB 600 mg  Status:  Discontinued        600 mg 300 mL/hr over 60 Minutes Intravenous Every 12 hours 02/18/20 1530 02/19/20 1502   02/16/20 1038  vancomycin (VANCOCIN) 1-5 GM/200ML-% IVPB       Note to Pharmacy: Leonia Reader   : cabinet override      02/16/20 1038 02/16/20 2244   02/16/20 0600  ceFAZolin (ANCEF) IVPB 2g/100 mL premix  Status:  Discontinued        2 g 200 mL/hr over 30 Minutes Intravenous On  call to O.R. 02/15/20 2156 02/16/20 1411   02/15/20 1300  vancomycin (VANCOCIN) IVPB 1000 mg/200 mL premix  Status:  Discontinued        1,000 mg 200 mL/hr over 60 Minutes Intravenous Every 12 hours 02/15/20 0244 02/18/20 1530   02/15/20 1000  fluconazole (DIFLUCAN) tablet 400 mg  Status:  Discontinued        400 mg Oral Daily 02/15/20 0103 02/19/20 1502   02/15/20 0600  piperacillin-tazobactam (ZOSYN) IVPB 3.375 g  Status:  Discontinued        3.375 g 12.5 mL/hr over 240 Minutes Intravenous Every 8 hours 02/15/20 0111 02/19/20 1502   02/15/20 0115  piperacillin-tazobactam (ZOSYN) IVPB 3.375 g  Status:  Discontinued        3.375 g 100 mL/hr over 30 Minutes Intravenous  Once 02/15/20 0103 02/15/20 0111   02/15/20 0115  vancomycin (VANCOCIN) IVPB 1000 mg/200 mL premix  Status:  Discontinued        1,000 mg 200 mL/hr over 60 Minutes Intravenous  Once 02/15/20 0103 02/15/20 0111   02/15/20 0115  vancomycin (VANCOREADY) IVPB 500 mg/100 mL        500 mg 100 mL/hr over 60 Minutes Intravenous  Once 02/15/20 0111 02/15/20 0417   02/14/20 2330  vancomycin (VANCOCIN) IVPB 1000 mg/200 mL premix        1,000 mg 200 mL/hr over 60 Minutes Intravenous  Once 02/14/20 2328 02/15/20 0235   02/14/20 2330  piperacillin-tazobactam (ZOSYN) IVPB 3.375 g        3.375 g 100 mL/hr over 30 Minutes Intravenous  Once 02/14/20 2328 02/15/20 0051        Subjective: Seen and examined at bedside and she is still complaining of some pain.  No nausea or vomiting.  Thinks that she is getting better.  Frustrated that there is no skilled nursing facility bed available but understands that she is not safe to be discharged home with her current living condition without a ramp.  No other concerns or complaints at this time.  Objective: Vitals:   03/01/20 0329 03/01/20 0749 03/01/20 1209 03/01/20 1527  BP: 132/63 129/73 107/69 118/64  Pulse: (!) 108 95 100 96  Resp: 16 16 15 15   Temp: 98.4 F (36.9 C) 98.5 F (36.9 C)  98.5 F (36.9 C) 98.5 F (36.9 C)  TempSrc: Oral Oral Oral Oral  SpO2: 98% 98% 97% 99%  Weight:      Height:        Intake/Output Summary (Last 24 hours) at 03/01/2020 1726 Last data filed at 03/01/2020 0900 Gross per 24 hour  Intake 120 ml  Output 350 ml  Net -230 ml   Filed Weights   02/14/20 1722  Weight: 60.8 kg   Examination: Physical Exam:  Constitutional: Thin Caucasian female currently in NAD and appears a little manic and slightly uncomfortable Eyes: Lids and conjunctivae normal, sclerae anicteric  ENMT: External Ears, Nose appear normal. Grossly normal hearing.  Neck: Appears normal, supple, no cervical masses, normal ROM, no appreciable thyromegaly; no JVD Respiratory: Diminished to auscultation bilaterally, no wheezing, rales, rhonchi or crackles. Normal respiratory effort and patient is not tachypenic. No accessory muscle use. Unlabored Breathing  Cardiovascular: RRR, no murmurs / rubs / gallops. S1 and S2 auscultated.   Abdomen: Soft, non-tender, non-distended. Bowel sounds positive.  GU: Deferred. Musculoskeletal: No clubbing / cyanosis of digits/nails. Has a Right AKA Skin: No rashes, lesions, ulcers on a limited skin evaluation. No induration; Warm and dry.  Neurologic: CN 2-12 grossly intact with no focal deficits. Romberg sign and cerebellar reflexes not assessed.  Psychiatric: Normal judgment and insight. Alert and oriented x 3. Normal mood and appropriate affect.   Data Reviewed: I have personally reviewed following labs and imaging studies  CBC: Recent Labs  Lab 02/26/20 0919 02/29/20 0614 03/01/20 0517  WBC 7.4 4.4 4.0  NEUTROABS 6.1 2.7 2.3  HGB 9.4* 8.7* 9.0*  HCT 29.8* 27.6* 27.5*  MCV 86.4 88.2 86.5  PLT 176 134* 384*   Basic Metabolic Panel: Recent Labs  Lab 02/26/20 0919 02/29/20 0614  NA 133* 139  K 4.0 3.6  CL 101 106  CO2 24 25  GLUCOSE 125* 108*  BUN 11 9  CREATININE 0.42* 0.41*  CALCIUM 8.6* 8.6*  MG  --  2.0  PHOS  --   3.9   GFR: Estimated Creatinine Clearance: 77.1 mL/min (A) (by C-G formula based on SCr of 0.41 mg/dL (L)). Liver Function Tests: Recent Labs  Lab 02/26/20 0919 02/29/20 0614  AST 39 37  ALT 24 20  ALKPHOS 92 120  BILITOT 0.7 0.8  PROT 6.6 6.5  ALBUMIN 2.7* 2.6*   No results for input(s): LIPASE, AMYLASE in the last 168 hours. No results for input(s): AMMONIA in the last 168 hours. Coagulation Profile: No results for input(s): INR, PROTIME in the last 168 hours. Cardiac Enzymes: No results for input(s): CKTOTAL, CKMB, CKMBINDEX, TROPONINI in the last 168 hours. BNP (last 3 results) No results for input(s): PROBNP in the last 8760 hours. HbA1C: No results for input(s): HGBA1C in the last 72 hours. CBG: No results for input(s): GLUCAP in the last 168 hours. Lipid Profile: No results for input(s): CHOL, HDL, LDLCALC, TRIG, CHOLHDL, LDLDIRECT in the last 72 hours. Thyroid Function Tests: No results for input(s): TSH, T4TOTAL, FREET4, T3FREE, THYROIDAB in the last 72 hours. Anemia Panel: No results for input(s): VITAMINB12, FOLATE, FERRITIN, TIBC, IRON, RETICCTPCT in the last 72 hours. Sepsis Labs: Recent Labs  Lab 02/26/20 0919 02/26/20 1136  PROCALCITON <0.10  --   LATICACIDVEN 1.4 1.5    No results found for this or any previous visit (from the past 240 hour(s)).   RN Pressure Injury Documentation:     Estimated body mass index is 24.51 kg/m as calculated from the following:   Height as of this encounter: 5\' 2"  (1.575 m).   Weight as of this encounter: 60.8 kg.  Malnutrition Type:  Nutrition Problem: Increased nutrient needs Etiology: post-op healing   Malnutrition Characteristics:  Signs/Symptoms: estimated needs   Nutrition Interventions:    Radiology Studies: No results  found.  Scheduled Meds: . Chlorhexidine Gluconate Cloth  6 each Topical Once  . dicyclomine  20 mg Oral TID AC & HS  . DULoxetine  30 mg Oral Daily  . enoxaparin (LOVENOX)  injection  40 mg Subcutaneous Q24H  . ferrous sulfate  325 mg Oral Q breakfast  . furosemide  20 mg Oral Daily  . gabapentin  400 mg Oral TID  . lactulose  30 g Oral TID  . magnesium oxide  400 mg Oral Daily  . metoprolol succinate  25 mg Oral Daily  . multivitamin with minerals  1 tablet Oral Daily  . OLANZapine  10 mg Oral QHS  . oxyCODONE  10 mg Oral Q12H  . pantoprazole  40 mg Oral Daily  . Ensure Max Protein  11 oz Oral Daily  . senna  1 tablet Oral BID  . spironolactone  25 mg Oral Daily  . sucralfate  1 g Oral TID WC & HS  . vitamin B-12  500 mcg Oral Daily   Continuous Infusions: . sodium chloride Stopped (02/22/20 2210)    LOS: 16 days    Kerney Elbe, DO [facility] PAGER is on AMION  If 7PM-7AM, please contact night-coverage [email or web address]

## 2020-03-02 DIAGNOSIS — R000 Tachycardia, unspecified: Secondary | ICD-10-CM

## 2020-03-02 MED ORDER — METOPROLOL SUCCINATE ER 50 MG PO TB24
50.0000 mg | ORAL_TABLET | Freq: Every day | ORAL | Status: DC
  Administered 2020-03-03 – 2020-03-05 (×3): 50 mg via ORAL
  Filled 2020-03-02 (×3): qty 1

## 2020-03-02 NOTE — Progress Notes (Signed)
Physical Therapy Treatment Patient Details Name: Rhonda Martin MRN: [id number] DOB: 08/25/1975 Today's Date: 03/02/2020    History of Present Illness Pt is a 44 y.o. female presenting to hospital 8/28 with confusion and R knee pain.  Pt with recent admit 8/5-8/10 for R TKA (Dr. Rudene Christians).  New imaging of R knee showing apparent fx fragment adjacent to lateral aspect of proximal tibia; subtle fx line extending through the tibial metaphysis.  Pt now admitted with sepsis secondary surgical site wound dehiscence; also noted with patellar tendon rupture.  Pt s/p R total knee revision and I&D 8/29.  PMH includes alcohol abuse, anxiety, h/o back injury, Charcot-Marie-Tooth disease, COPD, Hepatitis C, htn, thrombocytopenia.    PT Comments    Pt was sitting EOB upon arriving. She agrees to PT session and is cooperative throughout. Pt reports her husband is working on ramp today. She was easily able to stand and ambulate to rehab gym and return with supervision + occasional CGA for safety. W/c follow for safety but did not need this date. Pt very talkative about home environment and safety concerns with returning home. Therapist discussed situation of not having bed offer and need for return home at DC. She states understanding but continues to voice concerns with returning to toxic environment. PT still recommend DC to SNF however since no bed offers, will recommend DC to home with ramp + HHPT. Pt returned to sitting at EOB with bed alarm set and call bell in reach.     Follow Up Recommendations  Supervision/Assistance - 24 hour;Supervision for mobility/OOB;SNF     Equipment Recommendations  None recommended by PT    Recommendations for Other Services       Precautions / Restrictions Precautions Precautions: Fall Precaution Booklet Issued: No Precaution Comments: Moderate fall Restrictions Weight Bearing Restrictions: Yes RLE Weight Bearing: Non weight bearing    Mobility  Bed Mobility Overal  bed mobility: Modified Independent       Transfers Overall transfer level: Modified independent     Ambulation/Gait Ambulation/Gait assistance: Min guard;Supervision Gait Distance (Feet): 200 Feet Assistive device: Rolling walker (2 wheeled) Gait Pattern/deviations:  (" hop to") Gait velocity: WNL   General Gait Details: pt was able to ambyulate to rehab gym then back to room. unwilling to trial stair training 2/2 to stating I'll just wait till ramp is built."   Stairs         General stair comments: unwilling to trial       Balance Overall balance assessment: Needs assistance Sitting-balance support: Feet supported Sitting balance-Leahy Scale: Normal Sitting balance - Comments: no LOB while seated EOB. pt I'ly sit EOB throughout the day   Standing balance support: Bilateral upper extremity supported;During functional activity Standing balance-Leahy Scale: Fair Standing balance comment: reliant on UE support for balance. fall risk 2/2 to poor safety awareness and impulsivity         Cognition Arousal/Alertness: Awake/alert Behavior During Therapy: WFL for tasks assessed/performed;Impulsive Overall Cognitive Status: Within Functional Limits for tasks assessed      General Comments: Pt was A throughout session. was willing to participate but lengthy discussion on ramp and d/c disposition. She reports husband is working on ramp today             Pertinent Vitals/Pain Pain Assessment: No/denies pain Pain Score: 0-No pain Faces Pain Scale: No hurt Pain Location: RLE Pain Descriptors / Indicators: Burning;Discomfort;Moaning;Grimacing;Guarding Pain Intervention(s): Limited activity within patient's tolerance;Monitored during session;Premedicated before session;Repositioned  PT Goals (current goals can now be found in the care plan section) Acute Rehab PT Goals Patient Stated Goal: go home Progress towards PT goals: Progressing toward goals     Frequency    7X/week      PT Plan Current plan remains appropriate    Co-evaluation     PT goals addressed during session: Mobility/safety with mobility (improve safety awareness)        AM-PAC PT "6 Clicks" Mobility   Outcome Measure  Help needed turning from your back to your side while in a flat bed without using bedrails?: None Help needed moving from lying on your back to sitting on the side of a flat bed without using bedrails?: None Help needed moving to and from a bed to a chair (including a wheelchair)?: None Help needed standing up from a chair using your arms (e.g., wheelchair or bedside chair)?: A Little Help needed to walk in hospital room?: A Little Help needed climbing 3-5 steps with a railing? : A Lot 6 Click Score: 20    End of Session Equipment Utilized During Treatment: Gait belt Activity Tolerance: Patient tolerated treatment well Patient left: in chair;with call bell/phone within reach;with chair alarm set Nurse Communication: Mobility status PT Visit Diagnosis: Unsteadiness on feet (R26.81);Other abnormalities of gait and mobility (R26.89);Muscle weakness (generalized) (M62.81);Difficulty in walking, not elsewhere classified (R26.2);Pain Pain - Right/Left: Right Pain - part of body: Leg     Time: 1201-1225 PT Time Calculation (min) (ACUTE ONLY): 24 min  Charges:  $Gait Training: 8-22 mins $Therapeutic Activity: 8-22 mins                     Julaine Fusi PTA 03/02/20, 1:22 PM

## 2020-03-02 NOTE — Progress Notes (Signed)
PROGRESS NOTE    Rhonda Martin  WPY:[id number] DOB: 22-Feb-1976 DOA: 02/14/2020 PCP: Volney American, PA-C  Brief Narrative:  The patient is a 44 year old Caucasian female with a past medical history significant for but not limited to Charcot-Marie-Tooth disease, alcohol abuse, anxiety and depression, hepatitis C, history of narcotic and polysubstance abuse, liver cirrhosis, chronic pain syndrome as well as other comorbidities who underwent a right total knee arthroplasty on 0/10/3974 that was complicated by wound dehiscence and bleeding for which that she had a total right knee revision on 02/04/2020 by Dr. Rudene Christians.  At that time she was apparently found to have osteomyelitis subsequently and presented with acute onset of suspect dislocation and worsening swelling tenderness and pain of her right knee.  She is found to have a ruptured patella tendon at that time was discharged on 02/09/2020 after being hospitalized for sepsis secondary right knee wound infection after right recent total knee arthroplasty and medical noncompliance.  During that time IV had recommend on discharge the patient be discharged on doxycycline 100 mg twice a day for 6 weeks as well as oral fluconazole for 6 weeks.  She subsequently presented to the hospital again and was hospitalized on 02/15/2020 and since she feels her right total knee replacement vascular surgery was consulted by Dr. 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  PT OT evaluated and recommending SNF discharge however currently there is no bed available and she is unsafe to discharge home as she is still pending.  Husband is going to build a ramp for the patient and she can likely be discharged once cleared by PT OT and when she is worked with steroid therapy.  Currently the recommendation is still for SNF unless she can have the ramp built for access and stair training.  She states that her husband is close to finishing her up in a few  days.  Assessment & Plan:   Active Problems:   Weakness   Generalized anxiety disorder   HTN (hypertension)   Osteomyelitis of right lower extremity (HCC)   Ischemic chest pain (HCC)  SIRS with Low-grade Fever/Tachycardic (on 02/26/2020); Admission Sepsis Resolved  -Resolved -Hemodynamically stable, labs within normal limits no signs of infection. -Denies of having any dysuria shortness of breath or chest pain, denies any cough or congestion. -WBC 7.4 then and not repeated, lactic acid 1.4 with repeat of 1.5, procalcitonin <0.10 -Chest x-ray reviewed, improved from the last chest x-ray, finding consistent with improving pneumonia versus edema -Repeat WBC this AM was 4.0 on 9/24 -We will abstain from any antibiotics use at this time -Continue to Monitor Closely   Failed right total knee replacement: with associated right lower extremity osteomyelitis and severe nonpurulent knee cellulitis.  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 Surgical path showed "RIGHT LEG, ABOVE KNEE AMPUTATION:  - SUBCUTANEOUS TISSUE AND SKELETAL MUSCLE WITH ABSCESS AND NECROSIS.  - CHRONIC OSTEOMYELITIS WITH BONE REMODELING.  - NEGATIVE FOR MALIGNANCY.  - CHRONIC INFLAMMATORY CHANGES ARE PRESENT WITHIN BONE AT THE PROXIMAL  RESECTION MARGIN. " -Previous knee cultures grew stap epi and candidia parapsilosis. W/ wound dehiscence, noncompliance w/ medication and alcohol/drug abuse, ortho surg felt the infection will not resolve so plan was for a R AKA. 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  -Continue on oxycodone, gabapentin, oxycontin as pt has severe pain.  -Continues to complain of pain but has been on chronic pain medication appears comfortable  today  Hyponatremia -Patient's Na+ was 133 and repeat was 139 on 9/23 -Repeat CMP intermittently  HTN -Last blood pressure was 121/68 -Continue monitoring,  -Continue home medication Metoprolol  Succinate 25 mg po Daily and Furosemide 20 mg po daily and with Spironolactone 25 mg po Daily; we will increase her metoprolol succinate to 50 mg p.o. daily given her elevation in her heart rate -No longer on as needed Hydralazine  Hypokalemia -Improved and now K+ 3.6 on last check on 9/23 -Continue to monitor intermittently and replete as necessary -Repeat CMP intermittently and will check in the AM  GERD -Continue Pantoprazole 40 mg po Daily  -C/w Sucralfate 1 gram po TIDwm and qHS  Peripheral Neuropathy:  -Stable, continue home dose Gabapentin 400 mg po TID -Slowly tapering down narcotics  Acute on Chronic Pain Syndrome -Again We will make no changes to medications today but her request to go up on her pain medication -The day before today Dr. Jamesetta Geralds had an extensive discussion regarding pain management was held with patient she expressed understanding seem to be cooperative -Current Pain Regimen: OxyIR from 5 mg every 6 hours as needed, continue Ultram 50 mg every 6 hours as needed, Gabapentin 400 mg p.o. 3 times daily -Because she was still complaining of significant amount of pain we have given her a dose of IV ketorolac 30 mg x 1 yesterday but will not adjust again   Depression and Anxiety -C/w Duloxetine 30 mg p.o. daily, Hydroxyzine 25 mg p.o. 3 times daily as needed anxiety, Olanzapine 10 mg p.o. nightly,  Cirrhosis and Hx of Hepatitis C -LFTs stable and no signs of asterixis -Last Ammonia was 43 on 02/20/20 -Did have some drowsiness today but was not noted to have encephalopathy,  -Continue Lactulose 30 grams TID, Furosemide 20 mg po Daily and Spironolactone 25 mg po Daily   Thrombocytopenia -Patient's platelet count has now improved and stabilized on last 3 checks the last check being on 02/26/2020; Was a little lower 9/23 at 134,000 and is now 141,000 on last check -No signs and symptoms of bleeding -Repeat CBC in the AM   Abnormal LFTs/Transaminitis -Likely  secondary to cirrhosis. AST is labile but is now improved and is 37 on 9/23 -Repeat CMP intermittently   Charcot-Marie Tooth Disease -Continue w/ supportive care   Normocytic Anemia -Patient's Hgb/Hct has been relatively stable and is now 9.0/27.5 on 03/01/20 -C/w Ferrous Sulfate 325 mg po Daily with Breakfast Check anemia panel in a.m. -Continue to monitor for signs and symptoms of bleeding; currently no overt bleeding noted and does not have any drainage around her stump site. -Continue to Monitor for S/Sx of Bleeding; Currently no overt bleeding noted -Repeat CBC in the AM   DVT prophylaxis: Enoxaparin 40 mg subcu every 24h  Code Status: FULL CODE  Family Communication: Husband at bedside Disposition Plan: SNF currently has no bed offers available; If Ramp can get built for home and she has IT trainer then can likely discharge Home with [place] but continues to require SNF Level of Care and cannot safely discharged until other bed is acquired at SNF or it is determined that she can do a home ramp. Per PT Note 03/02/20 "PT still recommend DC to SNF however since no bed offers, will recommend DC to home with ramp + HHPT"; talk with the patient's husband and he is in the process of building a ramp and states that it should be completed by the morning.  Status is: Inpatient  Remains inpatient appropriate because:Unsafe d/c plan and Inpatient level of care appropriate due to severity of illness   Dispo: The patient is from: Home              Anticipated d/c is to: SNF vs Home Health if               Anticipated d/c date is: 1-2 days              Patient currently is medically stable to d/c.  Consultants:   Orthopedic Surgery  Vascular Surgery  Infectious Diseases   Procedures:  Right above-the-knee amputation on 02/16/2020 done by Dr. Leotis Pain  Antimicrobials: Anti-infectives (From admission, onward)   Start     Dose/Rate Route Frequency Ordered Stop   02/18/20 2200   linezolid (ZYVOX) IVPB 600 mg  Status:  Discontinued        600 mg 300 mL/hr over 60 Minutes Intravenous Every 12 hours 02/18/20 1530 02/19/20 1502   02/16/20 1038  vancomycin (VANCOCIN) 1-5 GM/200ML-% IVPB       Note to Pharmacy: Leonia Reader   : cabinet override      02/16/20 1038 02/16/20 2244   02/16/20 0600  ceFAZolin (ANCEF) IVPB 2g/100 mL premix  Status:  Discontinued        2 g 200 mL/hr over 30 Minutes Intravenous On call to O.R. 02/15/20 2156 02/16/20 1411   02/15/20 1300  vancomycin (VANCOCIN) IVPB 1000 mg/200 mL premix  Status:  Discontinued        1,000 mg 200 mL/hr over 60 Minutes Intravenous Every 12 hours 02/15/20 0244 02/18/20 1530   02/15/20 1000  fluconazole (DIFLUCAN) tablet 400 mg  Status:  Discontinued        400 mg Oral Daily 02/15/20 0103 02/19/20 1502   02/15/20 0600  piperacillin-tazobactam (ZOSYN) IVPB 3.375 g  Status:  Discontinued        3.375 g 12.5 mL/hr over 240 Minutes Intravenous Every 8 hours 02/15/20 0111 02/19/20 1502   02/15/20 0115  piperacillin-tazobactam (ZOSYN) IVPB 3.375 g  Status:  Discontinued        3.375 g 100 mL/hr over 30 Minutes Intravenous  Once 02/15/20 0103 02/15/20 0111   02/15/20 0115  vancomycin (VANCOCIN) IVPB 1000 mg/200 mL premix  Status:  Discontinued        1,000 mg 200 mL/hr over 60 Minutes Intravenous  Once 02/15/20 0103 02/15/20 0111   02/15/20 0115  vancomycin (VANCOREADY) IVPB 500 mg/100 mL        500 mg 100 mL/hr over 60 Minutes Intravenous  Once 02/15/20 0111 02/15/20 0417   02/14/20 2330  vancomycin (VANCOCIN) IVPB 1000 mg/200 mL premix        1,000 mg 200 mL/hr over 60 Minutes Intravenous  Once 02/14/20 2328 02/15/20 0235   02/14/20 2330  piperacillin-tazobactam (ZOSYN) IVPB 3.375 g        3.375 g 100 mL/hr over 30 Minutes Intravenous  Once 02/14/20 2328 02/15/20 0051        Subjective: Seen and examined at bedside and she was sitting just in talking with her husband.  She still complains of uncontrolled  control pain however she looked comfortable.  No nausea or vomiting.  States that her heart rate jumped up again this morning.  We will go up on her beta-blocker.  She denies any other concerns or complaints and hope that she can be discharged once the ramp is built.  She has no other concerns or complaints  at this time but continues to ask for increasing her pain medication.  Objective: Vitals:   03/01/20 2325 03/02/20 0308 03/02/20 0824 03/02/20 1152  BP: 118/65 122/79 105/64 121/68  Pulse: (!) 106 (!) 110  (!) 109  Resp: 18 18 16 16   Temp: 98.6 F (37 C) 99.8 F (37.7 C) 98.8 F (37.1 C) 99 F (37.2 C)  TempSrc: Oral Oral Oral Oral  SpO2: 97% 94% 96% 99%  Weight:      Height:       No intake or output data in the 24 hours ending 03/02/20 1524 Filed Weights   02/14/20 1722  Weight: 60.8 kg   Examination: Physical Exam:  Constitutional: The patient is a thin Caucasian female currently in no acute distress appears calm sitting in the bedside talking with her husband Eyes: Lids and conjunctivae normal, sclerae anicteric  ENMT: External Ears, Nose appear normal. Grossly normal hearing.  Neck: Appears normal, supple, no cervical masses, normal ROM, no appreciable thyromegaly; no JVD Respiratory: Diminished to auscultation bilaterally, no wheezing, rales, rhonchi or crackles. Normal respiratory effort and patient is not tachypenic. No accessory muscle use.  Unlabored breathing Cardiovascular: Slightly tachycardic rate but regular rhythm, no murmurs / rubs / gallops. S1 and S2 auscultated Abdomen: Soft, non-tender, non-distended. Bowel sounds positive.  GU: Deferred. Musculoskeletal: No clubbing / cyanosis of digits/nails. No joint deformity upper and lower extremities.  Skin: No rashes, lesions, ulcers on limited skin evaluation. No induration; Warm and dry.  Neurologic: CN 2-12 grossly intact with no focal deficits. Romberg sign and cerebellar reflexes not assessed.  Psychiatric:  Normal judgment and insight. Alert and oriented x 3. Normal mood and appropriate affect.   Data Reviewed: I have personally reviewed following labs and imaging studies  CBC: Recent Labs  Lab 02/26/20 0919 02/29/20 0614 03/01/20 0517  WBC 7.4 4.4 4.0  NEUTROABS 6.1 2.7 2.3  HGB 9.4* 8.7* 9.0*  HCT 29.8* 27.6* 27.5*  MCV 86.4 88.2 86.5  PLT 176 134* 761*   Basic Metabolic Panel: Recent Labs  Lab 02/26/20 0919 02/29/20 0614  NA 133* 139  K 4.0 3.6  CL 101 106  CO2 24 25  GLUCOSE 125* 108*  BUN 11 9  CREATININE 0.42* 0.41*  CALCIUM 8.6* 8.6*  MG  --  2.0  PHOS  --  3.9   GFR: Estimated Creatinine Clearance: 77.1 mL/min (A) (by C-G formula based on SCr of 0.41 mg/dL (L)). Liver Function Tests: Recent Labs  Lab 02/26/20 0919 02/29/20 0614  AST 39 37  ALT 24 20  ALKPHOS 92 120  BILITOT 0.7 0.8  PROT 6.6 6.5  ALBUMIN 2.7* 2.6*   No results for input(s): LIPASE, AMYLASE in the last 168 hours. No results for input(s): AMMONIA in the last 168 hours. Coagulation Profile: No results for input(s): INR, PROTIME in the last 168 hours. Cardiac Enzymes: No results for input(s): CKTOTAL, CKMB, CKMBINDEX, TROPONINI in the last 168 hours. BNP (last 3 results) No results for input(s): PROBNP in the last 8760 hours. HbA1C: No results for input(s): HGBA1C in the last 72 hours. CBG: No results for input(s): GLUCAP in the last 168 hours. Lipid Profile: No results for input(s): CHOL, HDL, LDLCALC, TRIG, CHOLHDL, LDLDIRECT in the last 72 hours. Thyroid Function Tests: No results for input(s): TSH, T4TOTAL, FREET4, T3FREE, THYROIDAB in the last 72 hours. Anemia Panel: No results for input(s): VITAMINB12, FOLATE, FERRITIN, TIBC, IRON, RETICCTPCT in the last 72 hours. Sepsis Labs: Recent Labs  Lab 02/26/20  0919 02/26/20 1136  PROCALCITON <0.10  --   LATICACIDVEN 1.4 1.5    No results found for this or any previous visit (from the past 240 hour(s)).   RN Pressure Injury  Documentation:     Estimated body mass index is 24.51 kg/m as calculated from the following:   Height as of this encounter: 5\' 2"  (1.575 m).   Weight as of this encounter: 60.8 kg.  Malnutrition Type:  Nutrition Problem: Increased nutrient needs Etiology: post-op healing   Malnutrition Characteristics:  Signs/Symptoms: estimated needs   Nutrition Interventions:    Radiology Studies: No results found.  Scheduled Meds:  Chlorhexidine Gluconate Cloth  6 each Topical Once   dicyclomine  20 mg Oral TID AC & HS   DULoxetine  30 mg Oral Daily   enoxaparin (LOVENOX) injection  40 mg Subcutaneous Q24H   ferrous sulfate  325 mg Oral Q breakfast   furosemide  20 mg Oral Daily   gabapentin  400 mg Oral TID   lactulose  30 g Oral TID   magnesium oxide  400 mg Oral Daily   [START ON 03/03/2020] metoprolol succinate  50 mg Oral Daily   multivitamin with minerals  1 tablet Oral Daily   OLANZapine  10 mg Oral QHS   oxyCODONE  10 mg Oral Q12H   pantoprazole  40 mg Oral Daily   Ensure Max Protein  11 oz Oral Daily   senna  1 tablet Oral BID   spironolactone  25 mg Oral Daily   sucralfate  1 g Oral TID WC & HS   vitamin B-12  500 mcg Oral Daily   Continuous Infusions:   LOS: 17 days    Kerney Elbe, DO [facility] PAGER is on AMION  If 7PM-7AM, please contact night-coverage [email or web address]

## 2020-03-03 LAB — COMPREHENSIVE METABOLIC PANEL
ALT: 20 U/L (ref 0–44)
AST: 38 U/L (ref 15–41)
Albumin: 2.6 g/dL — ABNORMAL LOW (ref 3.5–5.0)
Alkaline Phosphatase: 148 U/L — ABNORMAL HIGH (ref 38–126)
Anion gap: 7 (ref 5–15)
BUN: 11 mg/dL (ref 6–20)
CO2: 25 mmol/L (ref 22–32)
Calcium: 8.7 mg/dL — ABNORMAL LOW (ref 8.9–10.3)
Chloride: 109 mmol/L (ref 98–111)
Creatinine, Ser: 0.37 mg/dL — ABNORMAL LOW (ref 0.44–1.00)
GFR calc Af Amer: >60 mL/min (ref >60)
GFR calc non Af Amer: >60 mL/min (ref >60)
Glucose, Bld: 130 mg/dL — ABNORMAL HIGH (ref 70–99)
Potassium: 3.9 mmol/L (ref 3.5–5.1)
Sodium: 141 mmol/L (ref 135–145)
Total Bilirubin: 0.9 mg/dL (ref 0.3–1.2)
Total Protein: 6.4 g/dL — ABNORMAL LOW (ref 6.5–8.1)

## 2020-03-03 LAB — CBC WITH DIFFERENTIAL/PLATELET
Abs Immature Granulocytes: 0.01 10*3/uL (ref 0.00–0.07)
Basophils Absolute: 0 10*3/uL (ref 0.0–0.1)
Basophils Relative: 1 %
Eosinophils Absolute: 0.1 10*3/uL (ref 0.0–0.5)
Eosinophils Relative: 4 %
HCT: 30 % — ABNORMAL LOW (ref 36.0–46.0)
Hemoglobin: 9.2 g/dL — ABNORMAL LOW (ref 12.0–15.0)
Immature Granulocytes: 0 %
Lymphocytes Relative: 29 %
Lymphs Abs: 0.9 10*3/uL (ref 0.7–4.0)
MCH: 27.4 pg (ref 26.0–34.0)
MCHC: 30.7 g/dL (ref 30.0–36.0)
MCV: 89.3 fL (ref 80.0–100.0)
Monocytes Absolute: 0.3 10*3/uL (ref 0.1–1.0)
Monocytes Relative: 10 %
Neutro Abs: 1.7 10*3/uL (ref 1.7–7.7)
Neutrophils Relative %: 56 %
Platelets: 127 10*3/uL — ABNORMAL LOW (ref 150–400)
RBC: 3.36 MIL/uL — ABNORMAL LOW (ref 3.87–5.11)
RDW: 17.4 % — ABNORMAL HIGH (ref 11.5–15.5)
WBC: 3.1 10*3/uL — ABNORMAL LOW (ref 4.0–10.5)
nRBC: 0 % (ref 0.0–0.2)

## 2020-03-03 LAB — PHOSPHORUS: Phosphorus: 4.8 mg/dL — ABNORMAL HIGH (ref 2.5–4.6)

## 2020-03-03 LAB — MAGNESIUM: Magnesium: 1.9 mg/dL (ref 1.7–2.4)

## 2020-03-03 NOTE — Progress Notes (Signed)
PROGRESS NOTE    Rhonda Martin  ION:[id number] DOB: Jun 25, 1975 DOA: 02/14/2020 PCP: Volney American, PA-C  Brief Narrative:  The patient is a 44 year old Caucasian female with a past medical history significant for but not limited to Charcot-Marie-Tooth disease, alcohol abuse, anxiety and depression, hepatitis C, history of narcotic and polysubstance abuse, liver cirrhosis, chronic pain syndrome as well as other comorbidities who underwent a right total knee arthroplasty on 07/12/4008 that was complicated by wound dehiscence and bleeding for which that she had a total right knee revision on 02/04/2020 by Dr. Rudene Christians.  At that time she was apparently found to have osteomyelitis subsequently and presented with acute onset of suspect dislocation and worsening swelling tenderness and pain of her right knee.  She is found to have a ruptured patella tendon at that time was discharged on 02/09/2020 after being hospitalized for sepsis secondary right knee wound infection after right recent total knee arthroplasty and medical noncompliance.  During that time IV had recommend on discharge the patient be discharged on doxycycline 100 mg twice a day for 6 weeks as well as oral fluconazole for 6 weeks.  She subsequently presented to the hospital again and was hospitalized on 02/15/2020 and since she feels her right total knee replacement vascular surgery was consulted by Dr. 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  PT OT evaluated and recommending SNF discharge however currently there is no bed available and she is unsafe to discharge home as she is still pending.  Husband is going to build a ramp for the patient and she can likely be discharged once cleared by PT OT and when she is worked with steroid therapy.  Currently the recommendation is still for SNF unless she can have the ramp built for access and stair training.  She states that her husband is close to finishing her up in a few days and when  asked about it today she states that it was not done and she needs more time.  She continues to complain of significant pain due to work with her multiple times today and they tried to educate her about proper ambulation with a walker but patient tends to hop.  Assessment & Plan:   Active Problems:   Weakness   Generalized anxiety disorder   HTN (hypertension)   Osteomyelitis of right lower extremity (HCC)   Ischemic chest pain (HCC)  SIRS with Low-grade Fever/Tachycardic (on 02/26/2020); Admission Sepsis Resolved  -Resolved -Hemodynamically stable, labs within normal limits no signs of infection. -Denies of having any dysuria shortness of breath or chest pain, denies any cough or congestion. -WBC 7.4 then and not repeated, lactic acid 1.4 with repeat of 1.5, procalcitonin <0.10 -Chest x-ray reviewed, improved from the last chest x-ray, finding consistent with improving pneumonia versus edema -Repeat WBC this AM was 3.1-We will abstain from any antibiotics use at this time -Continue to Monitor Closely   Failed right total knee replacement: with associated right lower extremity osteomyelitis and severe nonpurulent knee cellulitis.  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 Surgical path showed "RIGHT LEG, ABOVE KNEE AMPUTATION:  - SUBCUTANEOUS TISSUE AND SKELETAL MUSCLE WITH ABSCESS AND NECROSIS.  - CHRONIC OSTEOMYELITIS WITH BONE REMODELING.  - NEGATIVE FOR MALIGNANCY.  - CHRONIC INFLAMMATORY CHANGES ARE PRESENT WITHIN BONE AT THE PROXIMAL  RESECTION MARGIN. " -Previous knee cultures grew stap epi and candidia parapsilosis. W/ wound dehiscence, noncompliance w/ medication  and alcohol/drug abuse, ortho surg felt the infection will not resolve so plan was for a R AKA. 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  -Continue on oxycodone, gabapentin, oxycontin as pt has severe pain.  -Continues to complain of pain but has been on chronic  pain medication appears comfortable today  Hyponatremia -Patient's Na+ was 133 and repeat was 141 today -Repeat CMP intermittently  HTN -Last blood pressure was 120/82 -Continue monitoring,  -Continue home medication Metoprolol Succinate 25 mg po Daily and Furosemide 20 mg po daily and with Spironolactone 25 mg po Daily; we will increase her metoprolol succinate to 50 mg p.o. daily given her elevation in her heart rate -No longer on as needed Hydralazine  Hypokalemia -Improved and now K+ 3.9 -Continue to monitor intermittently and replete as necessary -Repeat CMP intermittently   GERD -Continue Pantoprazole 40 mg po Daily  -C/w Sucralfate 1 gram po TIDwm and qHS  Peripheral Neuropathy:  -Stable, continue home dose Gabapentin 400 mg po TID -Slowly tapering down narcotics  Acute on Chronic Pain Syndrome -Again We will make no changes to medications today but her request to go up on her pain medication -The day before today Dr. Jamesetta Geralds had an extensive discussion regarding pain management was held with patient she expressed understanding seem to be cooperative -Current Pain Regimen: OxyIR from 5 mg every 6 hours as needed, continue Ultram 50 mg every 6 hours as needed, Gabapentin 400 mg p.o. 3 times daily -Because she was still complaining of significant amount of pain we have given her a dose of IV ketorolac 30 mg x 1 yesterday but will not adjust again   Depression and Anxiety -C/w Duloxetine 30 mg p.o. daily, Hydroxyzine 25 mg p.o. 3 times daily as needed anxiety, Olanzapine 10 mg p.o. nightly,  Cirrhosis and Hx of Hepatitis C -LFTs stable and no signs of asterixis -Last Ammonia was 43 on 02/20/20 -Did have some drowsiness today but was not noted to have encephalopathy,  -Continue Lactulose 30 grams TID, Furosemide 20 mg po Daily and Spironolactone 25 mg po Daily   Sinus Tachycardia -Improving with increasing of her metoprolol -Continue to monitor per  protocol  Thrombocytopenia -Patient's platelet count has now improved and stabilized on last 3 checks the last check being on 02/26/2020; Was a little lower 9/23 at 134,000 and is now 141,000 on last check on 03/01/2000 and today it is 127,000 -No signs and symptoms of bleeding -Repeat CBC intermittently  Abnormal LFTs/Transaminitis -Likely secondary to cirrhosis. AST is labile but is now improved and is 37 on 9/23 -Repeat CMP intermittently   Charcot-Marie Tooth Disease -Continue w/ supportive care   Normocytic Anemia -Patient's Hgb/Hct has been relatively stable and is now 9.2/30.0 today -C/w Ferrous Sulfate 325 mg po Daily with Breakfast Check anemia panel in a.m. -Continue to monitor for signs and symptoms of bleeding; currently no overt bleeding noted and does not have any drainage around her stump site. -Continue to Monitor for S/Sx of Bleeding; Currently no overt bleeding noted -Repeat CBC intermittently  DVT prophylaxis: Enoxaparin 40 mg subcu every 24h  Code Status: FULL CODE  Family Communication: Husband at bedside Disposition Plan: SNF currently has no bed offers available; If Ramp can get built for home and she has IT trainer then can likely discharge Home with Home Health but continues to require SNF Level of Care and cannot safely discharged until other bed is acquired at SNF or it is determined that she can do a home ramp. Per PT  Note 03/02/20 "PT still recommend DC to SNF however since no bed offers, will recommend DC to home with ramp + HHPT"; the ramp to enter her house still about it and she does not still display proper techniques with physical therapy and needs 24-hour supervision for mobility and out of bed with assistance  Status is: Inpatient  Remains inpatient appropriate because:Unsafe d/c plan and Inpatient level of care appropriate due to severity of illness   Dispo: The patient is from: Home              Anticipated d/c is to: SNF vs Home Health if  she can display proper technique with physical therapy and do stair training.  The ramp is built and she can go home with home physical therapy and 24-hour supervision              Anticipated d/c date is: 1-2 days              Patient currently is medically stable to d/c.  Consultants:   Orthopedic Surgery  Vascular Surgery  Infectious Diseases   Procedures:  Right above-the-knee amputation on 02/16/2020 done by Dr. Leotis Pain  Antimicrobials: Anti-infectives (From admission, onward)   Start     Dose/Rate Route Frequency Ordered Stop   02/18/20 2200  linezolid (ZYVOX) IVPB 600 mg  Status:  Discontinued        600 mg 300 mL/hr over 60 Minutes Intravenous Every 12 hours 02/18/20 1530 02/19/20 1502   02/16/20 1038  vancomycin (VANCOCIN) 1-5 GM/200ML-% IVPB       Note to Pharmacy: Leonia Reader   : cabinet override      02/16/20 1038 02/16/20 2244   02/16/20 0600  ceFAZolin (ANCEF) IVPB 2g/100 mL premix  Status:  Discontinued        2 g 200 mL/hr over 30 Minutes Intravenous On call to O.R. 02/15/20 2156 02/16/20 1411   02/15/20 1300  vancomycin (VANCOCIN) IVPB 1000 mg/200 mL premix  Status:  Discontinued        1,000 mg 200 mL/hr over 60 Minutes Intravenous Every 12 hours 02/15/20 0244 02/18/20 1530   02/15/20 1000  fluconazole (DIFLUCAN) tablet 400 mg  Status:  Discontinued        400 mg Oral Daily 02/15/20 0103 02/19/20 1502   02/15/20 0600  piperacillin-tazobactam (ZOSYN) IVPB 3.375 g  Status:  Discontinued        3.375 g 12.5 mL/hr over 240 Minutes Intravenous Every 8 hours 02/15/20 0111 02/19/20 1502   02/15/20 0115  piperacillin-tazobactam (ZOSYN) IVPB 3.375 g  Status:  Discontinued        3.375 g 100 mL/hr over 30 Minutes Intravenous  Once 02/15/20 0103 02/15/20 0111   02/15/20 0115  vancomycin (VANCOCIN) IVPB 1000 mg/200 mL premix  Status:  Discontinued        1,000 mg 200 mL/hr over 60 Minutes Intravenous  Once 02/15/20 0103 02/15/20 0111   02/15/20 0115  vancomycin  (VANCOREADY) IVPB 500 mg/100 mL        500 mg 100 mL/hr over 60 Minutes Intravenous  Once 02/15/20 0111 02/15/20 0417   02/14/20 2330  vancomycin (VANCOCIN) IVPB 1000 mg/200 mL premix        1,000 mg 200 mL/hr over 60 Minutes Intravenous  Once 02/14/20 2328 02/15/20 0235   02/14/20 2330  piperacillin-tazobactam (ZOSYN) IVPB 3.375 g        3.375 g 100 mL/hr over 30 Minutes Intravenous  Once 02/14/20 2328 02/15/20 0051  Subjective: Seen and examined at bedside and she is extremely upset today complaining of significant pain in tears.  No nausea or vomiting.  States that she did not sleep very well last night.  No other concerns or complaints at this time and when asked about the ramp is being complete she got irritated and states that her husband is old and he cannot work that fast  Objective: Vitals:   03/02/20 2322 03/03/20 0421 03/03/20 0757 03/03/20 1225  BP: 116/72 112/69 108/69 120/82  Pulse: 96 (!) 108 70 82  Resp: 16 16 16 18   Temp: 98.6 F (37 C) 98.9 F (37.2 C) 98 F (36.7 C) 98.3 F (36.8 C)  TempSrc: Oral Oral Oral Oral  SpO2: 100% 94% 94% 98%  Weight:      Height:       No intake or output data in the 24 hours ending 03/03/20 1730 Filed Weights   02/14/20 1722  Weight: 60.8 kg   Examination: Physical Exam:  Constitutional: The patient is a thin Caucasian female currently in some distress and in tears complaining of being uncomfortable and in pain Eyes: Lids and conjunctivae normal, sclerae anicteric  ENMT: External Ears, Nose appear normal. Grossly normal hearing.   Neck: Appears normal, supple, no cervical masses, normal ROM, no appreciable thyromegaly no JVD Respiratory: Diminished to auscultation bilaterally, no wheezing, rales, rhonchi or crackles. Normal respiratory effort and patient is not tachypenic. No accessory muscle use.  Unlabored breathing Cardiovascular: RRR, no murmurs / rubs / gallops. S1 and S2 auscultated.  Abdomen: Soft, non-tender,  non-distended. Bowel sounds positive.  GU: Deferred. Musculoskeletal: No clubbing / cyanosis of digits/nails. Has a right AKA Skin: No rashes, lesions, ulcers on a limited skin evaluation. No induration; Warm and dry.  Neurologic: CN 2-12 grossly intact with no focal deficits. Romberg sign and cerebellar reflexes not assessed.  Psychiatric: Normal judgment and insight. Alert and oriented x 3. Anxious mood and appropriate affect.   Data Reviewed: I have personally reviewed following labs and imaging studies  CBC: Recent Labs  Lab 02/26/20 0919 02/29/20 0614 03/01/20 0517 03/03/20 0517  WBC 7.4 4.4 4.0 3.1*  NEUTROABS 6.1 2.7 2.3 1.7  HGB 9.4* 8.7* 9.0* 9.2*  HCT 29.8* 27.6* 27.5* 30.0*  MCV 86.4 88.2 86.5 89.3  PLT 176 134* 141* 903*   Basic Metabolic Panel: Recent Labs  Lab 02/26/20 0919 02/29/20 0614 03/03/20 0517  NA 133* 139 141  K 4.0 3.6 3.9  CL 101 106 109  CO2 24 25 25   GLUCOSE 125* 108* 130*  BUN 11 9 11   CREATININE 0.42* 0.41* 0.37*  CALCIUM 8.6* 8.6* 8.7*  MG  --  2.0 1.9  PHOS  --  3.9 4.8*   GFR: Estimated Creatinine Clearance: 77.1 mL/min (A) (by C-G formula based on SCr of 0.37 mg/dL (L)). Liver Function Tests: Recent Labs  Lab 02/26/20 0919 02/29/20 0614 03/03/20 0517  AST 39 37 38  ALT 24 20 20   ALKPHOS 92 120 148*  BILITOT 0.7 0.8 0.9  PROT 6.6 6.5 6.4*  ALBUMIN 2.7* 2.6* 2.6*   No results for input(s): LIPASE, AMYLASE in the last 168 hours. No results for input(s): AMMONIA in the last 168 hours. Coagulation Profile: No results for input(s): INR, PROTIME in the last 168 hours. Cardiac Enzymes: No results for input(s): CKTOTAL, CKMB, CKMBINDEX, TROPONINI in the last 168 hours. BNP (last 3 results) No results for input(s): PROBNP in the last 8760 hours. HbA1C: No results for input(s):  HGBA1C in the last 72 hours. CBG: No results for input(s): GLUCAP in the last 168 hours. Lipid Profile: No results for input(s): CHOL, HDL, LDLCALC,  TRIG, CHOLHDL, LDLDIRECT in the last 72 hours. Thyroid Function Tests: No results for input(s): TSH, T4TOTAL, FREET4, T3FREE, THYROIDAB in the last 72 hours. Anemia Panel: No results for input(s): VITAMINB12, FOLATE, FERRITIN, TIBC, IRON, RETICCTPCT in the last 72 hours. Sepsis Labs: Recent Labs  Lab 02/26/20 0919 02/26/20 1136  PROCALCITON <0.10  --   LATICACIDVEN 1.4 1.5    No results found for this or any previous visit (from the past 240 hour(s)).   RN Pressure Injury Documentation:     Estimated body mass index is 24.51 kg/m as calculated from the following:   Height as of this encounter: 5\' 2"  (1.575 m).   Weight as of this encounter: 60.8 kg.  Malnutrition Type:  Nutrition Problem: Increased nutrient needs Etiology: post-op healing   Malnutrition Characteristics:  Signs/Symptoms: estimated needs   Nutrition Interventions:    Radiology Studies: No results found.  Scheduled Meds:  Chlorhexidine Gluconate Cloth  6 each Topical Once   dicyclomine  20 mg Oral TID AC & HS   DULoxetine  30 mg Oral Daily   enoxaparin (LOVENOX) injection  40 mg Subcutaneous Q24H   ferrous sulfate  325 mg Oral Q breakfast   furosemide  20 mg Oral Daily   gabapentin  400 mg Oral TID   lactulose  30 g Oral TID   magnesium oxide  400 mg Oral Daily   metoprolol succinate  50 mg Oral Daily   multivitamin with minerals  1 tablet Oral Daily   OLANZapine  10 mg Oral QHS   oxyCODONE  10 mg Oral Q12H   pantoprazole  40 mg Oral Daily   Ensure Max Protein  11 oz Oral Daily   senna  1 tablet Oral BID   spironolactone  25 mg Oral Daily   sucralfate  1 g Oral TID WC & HS   vitamin B-12  500 mcg Oral Daily   Continuous Infusions:   LOS: 18 days    Kerney Elbe, DO [facility] PAGER is on AMION  If 7PM-7AM, please contact night-coverage [email or web address]

## 2020-03-03 NOTE — Progress Notes (Signed)
Physical Therapy Treatment Patient Details Name: TJUANA Martin MRN: [id number] DOB: Jan 03, 1976 Today's Date: 03/03/2020    History of Present Illness Pt is a 44 y.o. female presenting to hospital 8/28 with confusion and R knee pain.  Pt with recent admit 8/5-8/10 for R TKA (Dr. Rudene Christians).  New imaging of R knee showing apparent fx fragment adjacent to lateral aspect of proximal tibia; subtle fx line extending through the tibial metaphysis.  Pt now admitted with sepsis secondary surgical site wound dehiscence; also noted with patellar tendon rupture.  Pt s/p R total knee revision and I&D 8/29.  PMH includes alcohol abuse, anxiety, h/o back injury, Charcot-Marie-Tooth disease, COPD, Hepatitis C, htn, thrombocytopenia.    PT Comments    Agreed on 4th attempt this am. Pt with multiple c/o this am and reasons limiting therapy.  She is able to stand and hop-to gait 50' with walker.  She hops too far into walker box this am with foot and knee going under walker bar causing her to lean forward to maintain balance.  Education attempted but she is resistant and becomes irritated with me "I can't do that because of my disability"  "Nobody else has a problem with how I walk." I doubt this is her typical gait pattern in previous sessions as it would have been mentioned prior.   Attempted to explain reasons and was to correct to make gait safer but she was not open to education.  After 71' she chose to sit in wheelchair due to fatigue and was able to wheel herself back to her room and transfer with min a x 1 to recliner due to impulsivity.  Seems generally irritated today and no interested in further therapy interventions.  Needs met.   Follow Up Recommendations  Supervision/Assistance - 24 hour;Supervision for mobility/OOB;SNF     Equipment Recommendations  None recommended by PT    Recommendations for Other Services       Precautions / Restrictions Precautions Precautions: Fall Precaution Booklet Issued:  No Precaution Comments: Moderate fall Restrictions Weight Bearing Restrictions: Yes RLE Weight Bearing: Non weight bearing Other Position/Activity Restrictions: R AKA    Mobility  Bed Mobility                  Transfers Overall transfer level: Needs assistance Equipment used: Rolling walker (2 wheeled) Transfers: Sit to/from Stand Sit to Stand: Min guard            Ambulation/Gait Ambulation/Gait assistance: Min assist;Min guard Gait Distance (Feet): 40 Feet Assistive device: Rolling walker (2 wheeled)   Gait velocity: WNL   General Gait Details: hopping too close to walker bar today, resisting education   Theme park manager mobility: Yes Wheelchair propulsion: Both upper extremities Wheelchair parts: Independent  Modified Rankin (Stroke Patients Only)       Balance Overall balance assessment: Needs assistance Sitting-balance support: Feet supported Sitting balance-Leahy Scale: Normal     Standing balance support: Bilateral upper extremity supported;During functional activity Standing balance-Leahy Scale: Fair Standing balance comment: reliant on UE support for balance. fall risk 2/2 to poor safety awareness and impulsivity                            Cognition Arousal/Alertness: Awake/alert Behavior During Therapy: WFL for tasks assessed/performed;Impulsive Overall Cognitive Status: Within Functional Limits for tasks assessed  Exercises      General Comments        Pertinent Vitals/Pain Pain Assessment: 0-10 Faces Pain Scale: Hurts worst Pain Location: RLE Pain Descriptors / Indicators: Burning;Discomfort;Moaning;Grimacing;Guarding Pain Intervention(s): Premedicated before session;Repositioned;Limited activity within patient's tolerance    Home Living                      Prior Function             PT Goals (current goals can now be found in the care plan section) Progress towards PT goals: Progressing toward goals    Frequency    7X/week      PT Plan Current plan remains appropriate    Co-evaluation              AM-PAC PT "6 Clicks" Mobility   Outcome Measure  Help needed turning from your back to your side while in a flat bed without using bedrails?: None Help needed moving from lying on your back to sitting on the side of a flat bed without using bedrails?: None Help needed moving to and from a bed to a chair (including a wheelchair)?: A Little Help needed standing up from a chair using your arms (e.g., wheelchair or bedside chair)?: A Little Help needed to walk in hospital room?: A Little Help needed climbing 3-5 steps with a railing? : A Lot 6 Click Score: 19    End of Session Equipment Utilized During Treatment: Gait belt Activity Tolerance: Patient tolerated treatment well Patient left: in chair;with call bell/phone within reach;with chair alarm set Nurse Communication: Mobility status PT Visit Diagnosis: Unsteadiness on feet (R26.81);Other abnormalities of gait and mobility (R26.89);Muscle weakness (generalized) (M62.81);Difficulty in walking, not elsewhere classified (R26.2);Pain Pain - Right/Left: Right Pain - part of body: Leg     Time: 0370-9643 PT Time Calculation (min) (ACUTE ONLY): 11 min  Charges:  $Gait Training: 8-22 mins                    [name], PTA 03/03/20, 12:27 PM

## 2020-03-03 NOTE — Plan of Care (Signed)
  Problem: Health Behavior/Discharge Planning: Goal: Ability to manage health-related needs will improve Outcome: Progressing   Problem: Clinical Measurements: Goal: Ability to maintain clinical measurements within normal limits will improve Outcome: Progressing Goal: Will remain free from infection Outcome: Progressing Goal: Diagnostic test results will improve Outcome: Progressing Goal: Respiratory complications will improve Outcome: Progressing Goal: Cardiovascular complication will be avoided Outcome: Progressing   Problem: Activity: Goal: Risk for activity intolerance will decrease Outcome: Progressing   Problem: Nutrition: Goal: Adequate nutrition will be maintained Outcome: Progressing   Problem: Coping: Goal: Level of anxiety will decrease Outcome: Progressing   Problem: Elimination: Goal: Will not experience complications related to bowel motility Outcome: Progressing Goal: Will not experience complications related to urinary retention Outcome: Progressing   Problem: Pain Managment: Goal: General experience of comfort will improve Outcome: Progressing   Problem: Safety: Goal: Ability to remain free from injury will improve Outcome: Progressing   Problem: Skin Integrity: Goal: Risk for impaired skin integrity will decrease Outcome: Progressing   Problem: Education: Goal: Ability to verbalize activity precautions or restrictions will improve Outcome: Progressing   Problem: Activity: Goal: Ability to perform//tolerate increased activity and mobilize with assistive devices will improve Outcome: Progressing   Problem: Self-Care: Goal: Ability to meet self-care needs will improve Outcome: Progressing   Problem: Self-Concept: Goal: Ability to maintain and perform role responsibilities to the fullest extent possible will improve Outcome: Progressing   Problem: Pain Management: Goal: Pain level will decrease with appropriate interventions Outcome:  Progressing   Problem: Skin Integrity: Goal: Demonstration of wound healing without infection will improve Outcome: Progressing

## 2020-03-03 NOTE — Progress Notes (Signed)
OT Cancellation Note  Patient Details Name: GUIDA ASMAN MRN: [id number] DOB: 1976-02-06   Cancelled Treatment:    Reason Eval/Treat Not Completed: Patient at procedure or test/ unavailable Multiple attempts made. Pt. With nursing during the first attempt, pt. Managing a nosebleed, and talking with her husband during the 2nd attempt, followed by pt. Being with PT this a.m. will reattempt on the next treatment day.  Harrel Carina, MS, OTR/L 03/03/2020, 12:38 PM

## 2020-03-04 ENCOUNTER — Inpatient Hospital Stay: Payer: Medicaid Other

## 2020-03-04 DIAGNOSIS — M79662 Pain in left lower leg: Secondary | ICD-10-CM

## 2020-03-04 DIAGNOSIS — M7989 Other specified soft tissue disorders: Secondary | ICD-10-CM

## 2020-03-04 LAB — FIBRIN DERIVATIVES D-DIMER (ARMC ONLY): Fibrin derivatives D-dimer (ARMC): 865.64 ng/mL (FEU) — ABNORMAL HIGH (ref 0.00–499.00)

## 2020-03-04 LAB — PROCALCITONIN: Procalcitonin: <0.1 ng/mL

## 2020-03-04 MED ORDER — ALBUMIN HUMAN 25 % IV SOLN
25.0000 g | Freq: Once | INTRAVENOUS | Status: AC
  Administered 2020-03-04: 25 g via INTRAVENOUS
  Filled 2020-03-04: qty 100

## 2020-03-04 MED ORDER — FENTANYL CITRATE (PF) 100 MCG/2ML IJ SOLN: 25.0000 ug | Freq: Once | INTRAMUSCULAR | Status: DC

## 2020-03-04 MED ORDER — SALINE SPRAY 0.65 % NA SOLN
1.0000 | NASAL | Status: DC | PRN
  Administered 2020-03-04: 1 via NASAL
  Filled 2020-03-04: qty 44

## 2020-03-04 MED ORDER — ZOLPIDEM TARTRATE 5 MG PO TABS
5.0000 mg | ORAL_TABLET | Freq: Every day | ORAL | Status: DC
  Administered 2020-03-04: 5 mg via ORAL
  Filled 2020-03-04: qty 1

## 2020-03-04 MED ORDER — FUROSEMIDE 10 MG/ML IJ SOLN
60.0000 mg | Freq: Once | INTRAMUSCULAR | Status: AC
  Administered 2020-03-04: 60 mg via INTRAVENOUS
  Filled 2020-03-04: qty 8

## 2020-03-04 NOTE — Progress Notes (Addendum)
PROGRESS NOTE    Rhonda Martin  ALP:[id number] DOB: 26-Jul-1975 DOA: 02/14/2020 PCP: Volney American, PA-C  Brief Narrative:  The patient is a 44 year old Caucasian female with a past medical history significant for but not limited to Charcot-Marie-Tooth disease, alcohol abuse, anxiety and depression, hepatitis C, history of narcotic and polysubstance abuse, liver cirrhosis, chronic pain syndrome as well as other comorbidities who underwent a right total knee arthroplasty on 08/10/3297 that was complicated by wound dehiscence and bleeding for which that she had a total right knee revision on 02/04/2020 by Dr. Rudene Christians.  At that time she was apparently found to have osteomyelitis subsequently and presented with acute onset of suspect dislocation and worsening swelling tenderness and pain of her right knee.  She is found to have a ruptured patella tendon at that time was discharged on 02/09/2020 after being hospitalized for sepsis secondary right knee wound infection after right recent total knee arthroplasty and medical noncompliance.  During that time IV had recommend on discharge the patient be discharged on doxycycline 100 mg twice a day for 6 weeks as well as oral fluconazole for 6 weeks.  She subsequently presented to the hospital again and was hospitalized on 02/15/2020 and since she feels her right total knee replacement vascular surgery was consulted by Dr. 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  PT OT evaluated and recommending SNF discharge however currently there is no bed available and she is unsafe to discharge home as she is still pending.  Husband is going to build a ramp for the patient and she can likely be discharged once cleared by PT OT and when she is worked with steroid therapy.  Currently the recommendation is still for SNF unless she can have the ramp built for access and stair training.  She states that her husband is close to finishing her up in a few days and when  asked about it today she states that it was not done and she needs more time.  She continues to complain of significant pain still but will not give her any more Toradol given epistaxis. She continues to have Oxycontin, Oxycodone, and Tramadol but despite this has significant pain; Was to order Fentanyl 25 mg x1 but Nursing informed she can cry on demand and has been comfortable through out the day and has not had her PRN Oxycodone yet; Will stop IV Fentanyl x1. PT continues to try to work with her multiple times and they tried to educate her about proper ambulation with a walker but patient tends to hop.  Assessment & Plan:   Active Problems:   Weakness   Generalized anxiety disorder   HTN (hypertension)   Osteomyelitis of right lower extremity (HCC)   Ischemic chest pain (HCC)  SIRS with Low-grade Fever/Tachycardic (on 02/26/2020); Admission Sepsis Resolved  -Resolved -Hemodynamically stable, labs within normal limits no signs of infection. -Denies of having any dysuria shortness of breath or chest pain, denies any cough or congestion. -WBC 7.4 then and not repeated, lactic acid 1.4 with repeat of 1.5, procalcitonin <0.10 -Chest x-ray reviewed, improved from the last chest x-ray, finding consistent with improving pneumonia versus edema -Repeat WBC yesterday AM was 3.1 -We will abstain from any antibiotics use at this time -Continue to Monitor Closely   Failed right total knee replacement: with associated right lower extremity osteomyelitis and severe nonpurulent knee cellulitis.  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 Surgical path showed "RIGHT LEG, ABOVE KNEE AMPUTATION:  - SUBCUTANEOUS TISSUE AND SKELETAL MUSCLE WITH ABSCESS AND NECROSIS.  - CHRONIC OSTEOMYELITIS WITH BONE REMODELING.  - NEGATIVE FOR MALIGNANCY.  - CHRONIC INFLAMMATORY CHANGES ARE PRESENT WITHIN BONE AT THE PROXIMAL  RESECTION MARGIN.  " -Previous knee cultures grew stap epi and candidia parapsilosis. W/ wound dehiscence, noncompliance w/ medication and alcohol/drug abuse, ortho surg felt the infection will not resolve so plan was for a R AKA. 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  -Continue on oxycodone, gabapentin, oxycontin as pt has severe pain.  -Continues to complain of pain but has been on chronic pain medication appears comfortable today  Hyponatremia -Patient's Na+ was 133 and repeat was 141 yesterday -Repeat CMP intermittently  HTN -Last blood pressure was 112/59 -Continue monitoring,  -Continue home medication Metoprolol Succinate 25 mg po Daily and Furosemide 20 mg po daily and with Spironolactone 25 mg po Daily; we will increase her metoprolol succinate to 50 mg p.o. daily given her elevation in her heart rate -No longer on as needed Hydralazine  Hypokalemia -Improved and now K+ 3.9 -Continue to monitor intermittently and replete as necessary -Repeat CMP intermittently   GERD -Continue Pantoprazole 40 mg po Daily  -C/w Sucralfate 1 gram po TIDwm and qHS  Peripheral Neuropathy:  -Stable, continue home dose Gabapentin 400 mg po TID -Slowly tapering down narcotics  Acute on Chronic Pain Syndrome -Again no changes to po medications today but her request to go up on her pain medication -Dr. Jamesetta Geralds had an extensive discussion regarding pain management was held with patient she expressed understanding seem to be cooperative -Current Pain Regimen: OxyIR from 5 mg every 6 hours as needed, continue Ultram 50 mg every 6 hours as needed, Gabapentin 400 mg p.o. 3 times daily -Because she was still complaining of significant amount of pain we have given her a dose of IV ketorolac 30 mg x 1 but will not give anymore given her epistaxis. Was to give her a 1x dose of IV Fentanyl 25 mcg given that she was in excruciating pain and crying again today and appearing uncomfortable but nursing states she has been up and cleaned her whole  room and has been appearing comfortable and can cry on demand. Will stop IV Fentanyl x1 and continue with regular regimen as above.   Depression and Anxiety -C/w Duloxetine 30 mg p.o. daily, Hydroxyzine 25 mg p.o. 3 times daily as needed anxiety, Olanzapine 10 mg p.o. nightly  Epistaxis -Has been having nose bleeds since last evening and now stopped -Likely due to Dryness -Will start Saline Ocean Nasal Spray -Hold off on NSAIDS and will not give any more IV Toradol   Cirrhosis and Hx of Hepatitis C -LFTs stable and no signs of asterixis -Last Ammonia was 43 on 02/20/20 -Did have some drowsiness today but was not noted to have encephalopathy -LFTs are normal now on last check  -Continue Lactulose 30 grams TID, Furosemide 20 mg po Daily and Spironolactone 25 mg po Daily   Sinus Tachycardia -Improving with increasing of her metoprolol; Now on 50 mg po Metoprolol Succinate  -Continue to monitor per protocol   Insomnia -Changed Zolpidem from PRN to Scheduled  Thrombocytopenia -Patient's platelet count has now improved and stabilized on last 3 checks the last check being on 02/26/2020; Was a little lower 9/23 at 134,000 and is now 141,000 on last check on 03/01/2000 and yesterday it was 127,000 -No signs and symptoms of bleeding -Repeat CBC intermittently  LLE Swelling -In the setting of Cirrhosis and Hypoalbuminemia -Has diuretics as above -Will not allow [place] to give her IV Diuretics given that she states that she cannot get to the restroom in time.   Abnormal LFTs/Transaminitis -Likely secondary to cirrhosis.  -LFTs improved  -Repeat CMP intermittently   Charcot-Marie Tooth Disease -Continue w/ supportive care   Normocytic Anemia -Patient's Hgb/Hct has been relatively stable and is now 9.2/30.0 yesterday -C/w Ferrous Sulfate 325 mg po Daily with Breakfast Check anemia panel in a.m. -Continue to monitor for signs and symptoms of bleeding; currently no overt bleeding noted  and does not have any drainage around her stump site. -Continue to Monitor for S/Sx of Bleeding; Currently no overt bleeding noted -Repeat CBC intermittently  DVT prophylaxis: Enoxaparin 40 mg subcu every 24h  Code Status: FULL CODE  Family Communication: Husband at bedside Disposition Plan: SNF currently has no bed offers available; If Ramp can get built for home and she has IT trainer then can likely discharge Home with Home Health but continues to require SNF Level of Care and cannot safely discharged until other bed is acquired at SNF or it is determined that she can do a home ramp. Per PT Note 03/02/20 "PT still recommend DC to SNF however since no bed offers, will recommend DC to home with ramp + HHPT"; the ramp to enter her house still about it and she does not still display proper techniques with physical therapy and needs 24-hour supervision for mobility and out of bed with assistance  Status is: Inpatient  Remains inpatient appropriate because:Unsafe d/c plan and Inpatient level of care appropriate due to severity of illness   Dispo: The patient is from: Home              Anticipated d/c is to: SNF vs Home Health if she can display proper technique with physical therapy and do stair training.  The ramp is built and she can go home with home physical therapy and 24-hour supervision              Anticipated d/c date is: 1-2 days              Patient currently is medically stable to d/c.  Consultants:   Orthopedic Surgery  Vascular Surgery  Infectious Diseases   Procedures:  Right above-the-knee amputation on 02/16/2020 done by Dr. Leotis Pain  Antimicrobials: Anti-infectives (From admission, onward)   Start     Dose/Rate Route Frequency Ordered Stop   02/18/20 2200  linezolid (ZYVOX) IVPB 600 mg  Status:  Discontinued        600 mg 300 mL/hr over 60 Minutes Intravenous Every 12 hours 02/18/20 1530 02/19/20 1502   02/16/20 1038  vancomycin (VANCOCIN) 1-5 GM/200ML-% IVPB        Note to Pharmacy: Leonia Reader   : cabinet override      02/16/20 1038 02/16/20 2244   02/16/20 0600  ceFAZolin (ANCEF) IVPB 2g/100 mL premix  Status:  Discontinued        2 g 200 mL/hr over 30 Minutes Intravenous On call to O.R. 02/15/20 2156 02/16/20 1411   02/15/20 1300  vancomycin (VANCOCIN) IVPB 1000 mg/200 mL premix  Status:  Discontinued        1,000 mg 200 mL/hr over 60 Minutes Intravenous Every 12 hours 02/15/20 0244 02/18/20 1530   02/15/20 1000  fluconazole (DIFLUCAN) tablet 400 mg  Status:  Discontinued        400 mg Oral Daily  02/15/20 0103 02/19/20 1502   02/15/20 0600  piperacillin-tazobactam (ZOSYN) IVPB 3.375 g  Status:  Discontinued        3.375 g 12.5 mL/hr over 240 Minutes Intravenous Every 8 hours 02/15/20 0111 02/19/20 1502   02/15/20 0115  piperacillin-tazobactam (ZOSYN) IVPB 3.375 g  Status:  Discontinued        3.375 g 100 mL/hr over 30 Minutes Intravenous  Once 02/15/20 0103 02/15/20 0111   02/15/20 0115  vancomycin (VANCOCIN) IVPB 1000 mg/200 mL premix  Status:  Discontinued        1,000 mg 200 mL/hr over 60 Minutes Intravenous  Once 02/15/20 0103 02/15/20 0111   02/15/20 0115  vancomycin (VANCOREADY) IVPB 500 mg/100 mL        500 mg 100 mL/hr over 60 Minutes Intravenous  Once 02/15/20 0111 02/15/20 0417   02/14/20 2330  vancomycin (VANCOCIN) IVPB 1000 mg/200 mL premix        1,000 mg 200 mL/hr over 60 Minutes Intravenous  Once 02/14/20 2328 02/15/20 0235   02/14/20 2330  piperacillin-tazobactam (ZOSYN) IVPB 3.375 g        3.375 g 100 mL/hr over 30 Minutes Intravenous  Once 02/14/20 2328 02/15/20 0051        Subjective: Seen and examined at bedside and she again was in tears complaining of significant pain and states that she did not rest last night at all.  States that she 5 took an Ambien which helped.  No nausea or vomiting but states she has had epistaxis yesterday and today.  Has some leg swelling but will not allow [place] to give her any more  Lasix.  Denies any other concerns or complaints at this time but still appears uncomfortable and mildly distressed.  Objective: Vitals:   03/04/20 0446 03/04/20 0725 03/04/20 0944 03/04/20 1057  BP: 112/75 106/76 128/68 (!) 112/59  Pulse: 95 93 (!) 102 (!) 105  Resp: 18 20  18   Temp: 98.2 F (36.8 C) 98.5 F (36.9 C)  98.5 F (36.9 C)  TempSrc: Oral Oral    SpO2: 100% 98%  100%  Weight:      Height:        Intake/Output Summary (Last 24 hours) at 03/04/2020 1501 Last data filed at 03/04/2020 1003 Gross per 24 hour  Intake 120 ml  Output --  Net 120 ml   Filed Weights   02/14/20 1722  Weight: 60.8 kg   Examination: Physical Exam:  Constitutional: The patient is a thin Caucasian female currently in some distress again and in tears complaining of being uncomfortable and in significant pain and worried about epistaxis. Eyes: Lids and conjunctivae normal, sclerae anicteric  ENMT: External Ears, Nose appear normal. Grossly normal hearing.  Neck: Appears normal, supple, no cervical masses, normal ROM, no appreciable thyromegaly; no JVD Respiratory: Diminished to auscultation bilaterally with coarse breath sounds, no wheezing, rales, rhonchi or crackles. Normal respiratory effort and patient is not tachypenic. No accessory muscle use.  Unlabored breathing Cardiovascular: Mildly tachycardic rate but regular rhythm, no murmurs / rubs / gallops.  1+ lower extremity edema in the left leg Abdomen: Soft, non-tender, non-distended.  Bowel sounds positive.  GU: Deferred. Musculoskeletal: No clubbing / cyanosis of digits/nails.  Has a right AKA Skin: No rashes, lesions, ulcers on limited skin evaluation. No induration; Warm and dry.  Neurologic: CN 2-12 grossly intact with no focal deficits. Romberg sign and cerebellar reflexes not assessed.  Psychiatric: Normal judgment and insight. Alert and oriented x 3.  Anxious and slightly upset mood and appropriate affect.   Data Reviewed: I have  personally reviewed following labs and imaging studies  CBC: Recent Labs  Lab 02/29/20 0614 03/01/20 0517 03/03/20 0517  WBC 4.4 4.0 3.1*  NEUTROABS 2.7 2.3 1.7  HGB 8.7* 9.0* 9.2*  HCT 27.6* 27.5* 30.0*  MCV 88.2 86.5 89.3  PLT 134* 141* 706*   Basic Metabolic Panel: Recent Labs  Lab 02/29/20 0614 03/03/20 0517  NA 139 141  K 3.6 3.9  CL 106 109  CO2 25 25  GLUCOSE 108* 130*  BUN 9 11  CREATININE 0.41* 0.37*  CALCIUM 8.6* 8.7*  MG 2.0 1.9  PHOS 3.9 4.8*   GFR: Estimated Creatinine Clearance: 77.1 mL/min (A) (by C-G formula based on SCr of 0.37 mg/dL (L)). Liver Function Tests: Recent Labs  Lab 02/29/20 0614 03/03/20 0517  AST 37 38  ALT 20 20  ALKPHOS 120 148*  BILITOT 0.8 0.9  PROT 6.5 6.4*  ALBUMIN 2.6* 2.6*   No results for input(s): LIPASE, AMYLASE in the last 168 hours. No results for input(s): AMMONIA in the last 168 hours. Coagulation Profile: No results for input(s): INR, PROTIME in the last 168 hours. Cardiac Enzymes: No results for input(s): CKTOTAL, CKMB, CKMBINDEX, TROPONINI in the last 168 hours. BNP (last 3 results) No results for input(s): PROBNP in the last 8760 hours. HbA1C: No results for input(s): HGBA1C in the last 72 hours. CBG: No results for input(s): GLUCAP in the last 168 hours. Lipid Profile: No results for input(s): CHOL, HDL, LDLCALC, TRIG, CHOLHDL, LDLDIRECT in the last 72 hours. Thyroid Function Tests: No results for input(s): TSH, T4TOTAL, FREET4, T3FREE, THYROIDAB in the last 72 hours. Anemia Panel: No results for input(s): VITAMINB12, FOLATE, FERRITIN, TIBC, IRON, RETICCTPCT in the last 72 hours. Sepsis Labs: No results for input(s): PROCALCITON, LATICACIDVEN in the last 168 hours.  No results found for this or any previous visit (from the past 240 hour(s)).   RN Pressure Injury Documentation:     Estimated body mass index is 24.51 kg/m as calculated from the following:   Height as of this encounter: 5\' 2"   (1.575 m).   Weight as of this encounter: 60.8 kg.  Malnutrition Type:  Nutrition Problem: Increased nutrient needs Etiology: post-op healing   Malnutrition Characteristics:  Signs/Symptoms: estimated needs   Nutrition Interventions:    Radiology Studies: No results found.  Scheduled Meds: . Chlorhexidine Gluconate Cloth  6 each Topical Once  . dicyclomine  20 mg Oral TID AC & HS  . DULoxetine  30 mg Oral Daily  . enoxaparin (LOVENOX) injection  40 mg Subcutaneous Q24H  . ferrous sulfate  325 mg Oral Q breakfast  . furosemide  20 mg Oral Daily  . gabapentin  400 mg Oral TID  . lactulose  30 g Oral TID  . magnesium oxide  400 mg Oral Daily  . metoprolol succinate  50 mg Oral Daily  . multivitamin with minerals  1 tablet Oral Daily  . OLANZapine  10 mg Oral QHS  . oxyCODONE  10 mg Oral Q12H  . pantoprazole  40 mg Oral Daily  . Ensure Max Protein  11 oz Oral Daily  . senna  1 tablet Oral BID  . spironolactone  25 mg Oral Daily  . sucralfate  1 g Oral TID WC & HS  . vitamin B-12  500 mcg Oral Daily   Continuous Infusions:   LOS: 19 days    Kerney Elbe,  DO [facility] PAGER is on AMION  If 7PM-7AM, please contact night-coverage [email or web address]

## 2020-03-04 NOTE — Progress Notes (Signed)
PT Cancellation Note  Patient Details Name: Rhonda Martin MRN: [id number] DOB: 29-Mar-1976   Cancelled Treatment:    Reason Eval/Treat Not Completed: Other (comment)   Pt on phone on first attempt.  Returned 15 minutes later and pt sitting EOB.  Stated her nose has been bleeding a lot this am passing large clots and does not feel as she can participate due to fear of bleeding during session.  No active bleeding noted at this time or while on phone.  Will offer again this pm as time allows.  [name] 03/04/2020, 1:02 PM

## 2020-03-04 NOTE — Plan of Care (Signed)
  Problem: Health Behavior/Discharge Planning: Goal: Ability to manage health-related needs will improve Outcome: Progressing   Problem: Clinical Measurements: Goal: Ability to maintain clinical measurements within normal limits will improve Outcome: Progressing Goal: Will remain free from infection Outcome: Progressing Goal: Diagnostic test results will improve Outcome: Progressing Goal: Respiratory complications will improve Outcome: Progressing Goal: Cardiovascular complication will be avoided Outcome: Progressing   Problem: Activity: Goal: Risk for activity intolerance will decrease Outcome: Progressing   Problem: Nutrition: Goal: Adequate nutrition will be maintained Outcome: Progressing   Problem: Coping: Goal: Level of anxiety will decrease Outcome: Progressing   Problem: Elimination: Goal: Will not experience complications related to bowel motility Outcome: Progressing Goal: Will not experience complications related to urinary retention Outcome: Progressing   Problem: Safety: Goal: Ability to remain free from injury will improve Outcome: Progressing   Problem: Skin Integrity: Goal: Risk for impaired skin integrity will decrease Outcome: Progressing   Problem: Education: Goal: Ability to verbalize activity precautions or restrictions will improve Outcome: Progressing   Problem: Activity: Goal: Ability to perform//tolerate increased activity and mobilize with assistive devices will improve Outcome: Progressing   Problem: Self-Care: Goal: Ability to meet self-care needs will improve Outcome: Progressing   Problem: Self-Concept: Goal: Ability to maintain and perform role responsibilities to the fullest extent possible will improve Outcome: Progressing   Problem: Pain Management: Goal: Pain level will decrease with appropriate interventions Outcome: Progressing   Problem: Skin Integrity: Goal: Demonstration of wound healing without infection will  improve Outcome: Progressing

## 2020-03-04 NOTE — Progress Notes (Signed)
PT Cancellation Note  Patient Details Name: Rhonda Martin MRN: [id number] DOB: [birth year]   Cancelled Treatment:    Reason Eval/Treat Not Completed: Other (comment);Pain limiting ability to participate   Offered session again after lunch.  Stated she was in too much pain.  Pt began cyring stating her husband just left and ramp will be completed tomorrow.  "I just want to go home."  Encouragement given but she continued to decline session.   [name] 03/04/2020, 1:31 PM

## 2020-03-04 NOTE — Progress Notes (Signed)
Pt's left lower leg  red, swollen(+1 pitting), dry, warm and painful. Looks more red than yesterday. RN notified Attending MD

## 2020-03-04 NOTE — Progress Notes (Addendum)
PROGRESS NOTE    Rhonda Martin  YYQ:[id number] DOB: 11-Oct-1975 DOA: 02/14/2020 PCP: Volney American, PA-C  Brief Narrative:  The patient is a 44 year old Caucasian female with a past medical history significant for but not limited to Charcot-Marie-Tooth disease, alcohol abuse, anxiety and depression, hepatitis C, history of narcotic and polysubstance abuse, liver cirrhosis, chronic pain syndrome as well as other comorbidities who underwent a right total knee arthroplasty on 01/14/8915 that was complicated by wound dehiscence and bleeding for which that she had a total right knee revision on 02/04/2020 by Dr. Rudene Christians.  At that time she was apparently found to have osteomyelitis subsequently and presented with acute onset of suspect dislocation and worsening swelling tenderness and pain of her right knee.  She is found to have a ruptured patella tendon at that time was discharged on 02/09/2020 after being hospitalized for sepsis secondary right knee wound infection after right recent total knee arthroplasty and medical noncompliance.  During that time IV had recommend on discharge the patient be discharged on doxycycline 100 mg twice a day for 6 weeks as well as oral fluconazole for 6 weeks.  She subsequently presented to the hospital again and was hospitalized on 02/15/2020 and since she feels her right total knee replacement vascular surgery was consulted by Dr. 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  PT OT evaluated and recommending SNF discharge however currently there is no bed available and she is unsafe to discharge home as she is still pending.  Husband is going to build a ramp for the patient and she can likely be discharged once cleared by PT OT and when she is worked with steroid therapy.  Currently the recommendation is still for SNF unless she can have the ramp built for access and stair training.  She states that her husband is close to finishing her up in a few days and when  asked about it today she states that it was not done and she needs more time.  She continues to complain of significant pain still but will not give her any more Toradol given epistaxis. She continues to have Oxycontin, Oxycodone, and Tramadol but despite this has significant pain; Was to order Fentanyl 25 mg x1 but Nursing informed she can cry on demand and has been comfortable through out the day and has not had her PRN Oxycodone yet; Will stop IV Fentanyl x1. PT continues to try to work with her multiple times and they tried to educate her about proper ambulation with a walker but patient tends to hop.  Assessment & Plan:   Active Problems:   Weakness   Generalized anxiety disorder   HTN (hypertension)   Osteomyelitis of right lower extremity (HCC)   Ischemic chest pain (HCC)  SIRS with Low-grade Fever/Tachycardic (on 02/26/2020); Admission Sepsis Resolved  -Resolved -Hemodynamically stable, labs within normal limits no signs of infection. -Denies of having any dysuria shortness of breath or chest pain, denies any cough or congestion. -WBC 7.4 then and not repeated, lactic acid 1.4 with repeat of 1.5, procalcitonin <0.10 -Chest x-ray reviewed, improved from the last chest x-ray, finding consistent with improving pneumonia versus edema -Repeat WBC yesterday AM was 3.1; Continue to be Tachycardic -Check D-Dimer, LE Duplex and Procalctonin level again given LLE Swelling -We will abstain from any antibiotics use at this time -Continue to Monitor Closely   Failed right total knee replacement: with associated right lower extremity osteomyelitis and severe nonpurulent knee cellulitis.  -Remained stable -Status  post AKA 02/16/2020 -D/c IV vanco, zosyn & fluconazole as wound is clean as per vascular surg. Surgical path showed "RIGHT LEG, ABOVE KNEE AMPUTATION:  - SUBCUTANEOUS TISSUE AND SKELETAL MUSCLE WITH ABSCESS AND NECROSIS.  - CHRONIC OSTEOMYELITIS WITH BONE REMODELING.  - NEGATIVE FOR  MALIGNANCY.  - CHRONIC INFLAMMATORY CHANGES ARE PRESENT WITHIN BONE AT THE PROXIMAL  RESECTION MARGIN. " -Previous knee cultures grew stap epi and candidia parapsilosis. W/ wound dehiscence, noncompliance w/ medication and alcohol/drug abuse, ortho surg felt the infection will not resolve so plan was for a R AKA. 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  -Continue on oxycodone, gabapentin, oxycontin as pt has severe pain.  -Continues to complain of pain but has been on chronic pain medication appears comfortable today  Hyponatremia -Patient's Na+ was 133 and repeat was 141 yesterday -Repeat CMP intermittently  HTN -Last blood pressure was 112/59 -Continue monitoring,  -Continue home medication Metoprolol Succinate 25 mg po Daily and Furosemide 20 mg po daily and with Spironolactone 25 mg po Daily; we will increase her metoprolol succinate to 50 mg p.o. daily given her elevation in her heart rate -No longer on as needed Hydralazine  Hypokalemia -Improved and now K+ 3.9 -Continue to monitor intermittently and replete as necessary -Repeat CMP intermittently   GERD -Continue Pantoprazole 40 mg po Daily  -C/w Sucralfate 1 gram po TIDwm and qHS  Peripheral Neuropathy:  -Stable, continue home dose Gabapentin 400 mg po TID -Slowly tapering down narcotics  Acute on Chronic Pain Syndrome -Again no changes to po medications today but her request to go up on her pain medication -Dr. Jamesetta Geralds had an extensive discussion regarding pain management was held with patient she expressed understanding seem to be cooperative -Current Pain Regimen: OxyIR from 5 mg every 6 hours as needed, continue Ultram 50 mg every 6 hours as needed, Gabapentin 400 mg p.o. 3 times daily -Because she was still complaining of significant amount of pain we have given her a dose of IV ketorolac 30 mg x 1 but will not give anymore given her epistaxis. Was to give her a 1x dose of IV Fentanyl 25 mcg given that she was in excruciating pain and  crying again today and appearing uncomfortable but nursing states she has been up and cleaned her whole room and has been appearing comfortable and can cry on demand. Will stop IV Fentanyl x1 and continue with regular regimen as above.   Depression and Anxiety -C/w Duloxetine 30 mg p.o. daily, Hydroxyzine 25 mg p.o. 3 times daily as needed anxiety, Olanzapine 10 mg p.o. nightly  Epistaxis -Has been having nose bleeds since last evening and now stopped -Likely due to Dryness -Will start Saline Ocean Nasal Spray -Hold off on NSAIDS and will not give any more IV Toradol   Cirrhosis and Hx of Hepatitis C -LFTs stable and no signs of asterixis -Last Ammonia was 43 on 02/20/20 -Did have some drowsiness today but was not noted to have encephalopathy -LFTs are normal now on last check  -Continue Lactulose 30 grams TID, Furosemide 20 mg po Daily and Spironolactone 25 mg po Daily   Sinus Tachycardia -Improving with increasing of her metoprolol; Now on 50 mg po Metoprolol Succinate  -Continue to monitor per protocol   Insomnia -Changed Zolpidem from PRN to Scheduled  Left Leg Swelling -In the setting of Venous Stasis and Dependent edmea as well as Hypoalbuinemia -Check LE VENOUS DUPLEX to r/o DVT -Place IV and give IV Lasix 60 mg x1 and Albumin  25 g -Albumin was 2.6 -Has been Hobbling on Left Leg and does not elevate when sleeping -Elevate Leg Above the Level of the Heart -Check D-Dimer  Thrombocytopenia -Patient's platelet count has now improved and stabilized on last 3 checks the last check being on 02/26/2020; Was a little lower 9/23 at 134,000 and is now 141,000 on last check on 03/01/2000 and yesterday it was 127,000 -No signs and symptoms of bleeding -Repeat CBC intermittently  LLE Swelling -In the setting of Cirrhosis and Hypoalbuminemia -Has diuretics as above -Will not allow [place] to give her IV Diuretics given that she states that she cannot get to the restroom in time.    Abnormal LFTs/Transaminitis -Likely secondary to cirrhosis.  -LFTs improved  -Repeat CMP intermittently   Charcot-Marie Tooth Disease -Continue w/ supportive care   Normocytic Anemia -Patient's Hgb/Hct has been relatively stable and is now 9.2/30.0 yesterday -C/w Ferrous Sulfate 325 mg po Daily with Breakfast Check anemia panel in a.m. -Continue to monitor for signs and symptoms of bleeding; currently no overt bleeding noted and does not have any drainage around her stump site. -Continue to Monitor for S/Sx of Bleeding; Currently no overt bleeding noted -Repeat CBC intermittently  DVT prophylaxis: Enoxaparin 40 mg subcu every 24h  Code Status: FULL CODE  Family Communication: Husband at bedside Disposition Plan: SNF currently has no bed offers available; If Ramp can get built for home and she has IT trainer then can likely discharge Home with Home Health but continues to require SNF Level of Care and cannot safely discharged until other bed is acquired at SNF or it is determined that she can do a home ramp. Per PT Note 03/02/20 "PT still recommend DC to SNF however since no bed offers, will recommend DC to home with ramp + HHPT"; the ramp to enter her house still about it and she does not still display proper techniques with physical therapy and needs 24-hour supervision for mobility and out of bed with assistance  Status is: Inpatient  Remains inpatient appropriate because:Unsafe d/c plan and Inpatient level of care appropriate due to severity of illness   Dispo: The patient is from: Home              Anticipated d/c is to: SNF vs Home Health if she can display proper technique with physical therapy and do stair training.  The ramp is built and she can go home with home physical therapy and 24-hour supervision              Anticipated d/c date is: 1-2 days              Patient currently is medically stable to d/c.  Consultants:   Orthopedic Surgery  Vascular  Surgery  Infectious Diseases   Procedures:  Right above-the-knee amputation on 02/16/2020 done by Dr. Leotis Pain  Antimicrobials: Anti-infectives (From admission, onward)   Start     Dose/Rate Route Frequency Ordered Stop   02/18/20 2200  linezolid (ZYVOX) IVPB 600 mg  Status:  Discontinued        600 mg 300 mL/hr over 60 Minutes Intravenous Every 12 hours 02/18/20 1530 02/19/20 1502   02/16/20 1038  vancomycin (VANCOCIN) 1-5 GM/200ML-% IVPB       Note to Pharmacy: Leonia Reader   : cabinet override      02/16/20 1038 02/16/20 2244   02/16/20 0600  ceFAZolin (ANCEF) IVPB 2g/100 mL premix  Status:  Discontinued        2  g 200 mL/hr over 30 Minutes Intravenous On call to O.R. 02/15/20 2156 02/16/20 1411   02/15/20 1300  vancomycin (VANCOCIN) IVPB 1000 mg/200 mL premix  Status:  Discontinued        1,000 mg 200 mL/hr over 60 Minutes Intravenous Every 12 hours 02/15/20 0244 02/18/20 1530   02/15/20 1000  fluconazole (DIFLUCAN) tablet 400 mg  Status:  Discontinued        400 mg Oral Daily 02/15/20 0103 02/19/20 1502   02/15/20 0600  piperacillin-tazobactam (ZOSYN) IVPB 3.375 g  Status:  Discontinued        3.375 g 12.5 mL/hr over 240 Minutes Intravenous Every 8 hours 02/15/20 0111 02/19/20 1502   02/15/20 0115  piperacillin-tazobactam (ZOSYN) IVPB 3.375 g  Status:  Discontinued        3.375 g 100 mL/hr over 30 Minutes Intravenous  Once 02/15/20 0103 02/15/20 0111   02/15/20 0115  vancomycin (VANCOCIN) IVPB 1000 mg/200 mL premix  Status:  Discontinued        1,000 mg 200 mL/hr over 60 Minutes Intravenous  Once 02/15/20 0103 02/15/20 0111   02/15/20 0115  vancomycin (VANCOREADY) IVPB 500 mg/100 mL        500 mg 100 mL/hr over 60 Minutes Intravenous  Once 02/15/20 0111 02/15/20 0417   02/14/20 2330  vancomycin (VANCOCIN) IVPB 1000 mg/200 mL premix        1,000 mg 200 mL/hr over 60 Minutes Intravenous  Once 02/14/20 2328 02/15/20 0235   02/14/20 2330  piperacillin-tazobactam (ZOSYN)  IVPB 3.375 g        3.375 g 100 mL/hr over 30 Minutes Intravenous  Once 02/14/20 2328 02/15/20 0051        Subjective: Seen and examined at bedside and she again was in tears complaining of significant pain and states that she did not rest last night at all.  States that she 5 took an Ambien which helped.  No nausea or vomiting but states she has had epistaxis yesterday and today.  Has some leg swelling but will not allow [place] to give her any more Lasix.  Denies any other concerns or complaints at this time but still appears uncomfortable and mildly distressed.  Objective: Vitals:   03/04/20 0725 03/04/20 0944 03/04/20 1057 03/04/20 1536  BP: 106/76 128/68 (!) 112/59 122/70  Pulse: 93 (!) 102 (!) 105 (!) 109  Resp: 20  18 18   Temp: 98.5 F (36.9 C)  98.5 F (36.9 C) 97.8 F (36.6 C)  TempSrc: Oral     SpO2: 98%  100% 97%  Weight:      Height:        Intake/Output Summary (Last 24 hours) at 03/04/2020 1618 Last data filed at 03/04/2020 1003 Gross per 24 hour  Intake 120 ml  Output --  Net 120 ml   Filed Weights   02/14/20 1722  Weight: 60.8 kg   Examination: Physical Exam:  Constitutional: The patient is a thin Caucasian female currently in some distress again and in tears complaining of being uncomfortable and in significant pain and worried about epistaxis. Eyes: Lids and conjunctivae normal, sclerae anicteric  ENMT: External Ears, Nose appear normal. Grossly normal hearing.  Neck: Appears normal, supple, no cervical masses, normal ROM, no appreciable thyromegaly; no JVD Respiratory: Diminished to auscultation bilaterally with coarse breath sounds, no wheezing, rales, rhonchi or crackles. Normal respiratory effort and patient is not tachypenic. No accessory muscle use.  Unlabored breathing Cardiovascular: Mildly tachycardic rate but regular rhythm, no  murmurs / rubs / gallops.  1+ lower extremity edema in the left leg Abdomen: Soft, non-tender, non-distended.  Bowel sounds  positive.  GU: Deferred. Musculoskeletal: No clubbing / cyanosis of digits/nails.  Has a right AKA Skin: No rashes, lesions, ulcers on limited skin evaluation. No induration; Warm and dry.  Neurologic: CN 2-12 grossly intact with no focal deficits. Romberg sign and cerebellar reflexes not assessed.  Psychiatric: Normal judgment and insight. Alert and oriented x 3.  Anxious and slightly upset mood and appropriate affect.   Data Reviewed: I have personally reviewed following labs and imaging studies  CBC: Recent Labs  Lab 02/29/20 0614 03/01/20 0517 03/03/20 0517  WBC 4.4 4.0 3.1*  NEUTROABS 2.7 2.3 1.7  HGB 8.7* 9.0* 9.2*  HCT 27.6* 27.5* 30.0*  MCV 88.2 86.5 89.3  PLT 134* 141* 678*   Basic Metabolic Panel: Recent Labs  Lab 02/29/20 0614 03/03/20 0517  NA 139 141  K 3.6 3.9  CL 106 109  CO2 25 25  GLUCOSE 108* 130*  BUN 9 11  CREATININE 0.41* 0.37*  CALCIUM 8.6* 8.7*  MG 2.0 1.9  PHOS 3.9 4.8*   GFR: Estimated Creatinine Clearance: 77.1 mL/min (A) (by C-G formula based on SCr of 0.37 mg/dL (L)). Liver Function Tests: Recent Labs  Lab 02/29/20 0614 03/03/20 0517  AST 37 38  ALT 20 20  ALKPHOS 120 148*  BILITOT 0.8 0.9  PROT 6.5 6.4*  ALBUMIN 2.6* 2.6*   No results for input(s): LIPASE, AMYLASE in the last 168 hours. No results for input(s): AMMONIA in the last 168 hours. Coagulation Profile: No results for input(s): INR, PROTIME in the last 168 hours. Cardiac Enzymes: No results for input(s): CKTOTAL, CKMB, CKMBINDEX, TROPONINI in the last 168 hours. BNP (last 3 results) No results for input(s): PROBNP in the last 8760 hours. HbA1C: No results for input(s): HGBA1C in the last 72 hours. CBG: No results for input(s): GLUCAP in the last 168 hours. Lipid Profile: No results for input(s): CHOL, HDL, LDLCALC, TRIG, CHOLHDL, LDLDIRECT in the last 72 hours. Thyroid Function Tests: No results for input(s): TSH, T4TOTAL, FREET4, T3FREE, THYROIDAB in the last 72  hours. Anemia Panel: No results for input(s): VITAMINB12, FOLATE, FERRITIN, TIBC, IRON, RETICCTPCT in the last 72 hours. Sepsis Labs: No results for input(s): PROCALCITON, LATICACIDVEN in the last 168 hours.  No results found for this or any previous visit (from the past 240 hour(s)).   RN Pressure Injury Documentation:     Estimated body mass index is 24.51 kg/m as calculated from the following:   Height as of this encounter: 5\' 2"  (1.575 m).   Weight as of this encounter: 60.8 kg.  Malnutrition Type:  Nutrition Problem: Increased nutrient needs Etiology: post-op healing   Malnutrition Characteristics:  Signs/Symptoms: estimated needs   Nutrition Interventions:    Radiology Studies: No results found.  Scheduled Meds: . Chlorhexidine Gluconate Cloth  6 each Topical Once  . dicyclomine  20 mg Oral TID AC & HS  . DULoxetine  30 mg Oral Daily  . enoxaparin (LOVENOX) injection  40 mg Subcutaneous Q24H  . fentaNYL (SUBLIMAZE) injection  25 mcg Intravenous Once  . ferrous sulfate  325 mg Oral Q breakfast  . furosemide  20 mg Oral Daily  . gabapentin  400 mg Oral TID  . lactulose  30 g Oral TID  . magnesium oxide  400 mg Oral Daily  . metoprolol succinate  50 mg Oral Daily  . multivitamin with minerals  1 tablet Oral Daily  . OLANZapine  10 mg Oral QHS  . oxyCODONE  10 mg Oral Q12H  . pantoprazole  40 mg Oral Daily  . Ensure Max Protein  11 oz Oral Daily  . senna  1 tablet Oral BID  . spironolactone  25 mg Oral Daily  . sucralfate  1 g Oral TID WC & HS  . vitamin B-12  500 mcg Oral Daily  . zolpidem  5 mg Oral QHS   Continuous Infusions:   LOS: 19 days    Kerney Elbe, DO [facility] PAGER is on AMION  If 7PM-7AM, please contact night-coverage [email or web address]

## 2020-03-04 NOTE — Progress Notes (Signed)
Patient had an unwitnessed fall, did not hit her head, we heard patient yelling out upon entering room patient was sitting on floor in front of the bathroom saying that she fell. Bed alarm was not on and patient stated to NT and I that she has not wanted bed alarm on because she can use bathroom herself.  NT and I helped her back into the bed. There was some bleeding coming from where staples are on amputation site, I cleaned it and wrapped with 4x4 gauze, ABD pad, and kerlix. Bed alarm is now turned on and patient was advised to call out when she has to use the bathroom. NP Ouma notified.

## 2020-03-05 ENCOUNTER — Encounter: Payer: Self-pay | Admitting: Family Medicine

## 2020-03-05 ENCOUNTER — Inpatient Hospital Stay: Payer: Medicaid Other

## 2020-03-05 LAB — CBC WITH DIFFERENTIAL/PLATELET
Abs Immature Granulocytes: 0.01 10*3/uL (ref 0.00–0.07)
Basophils Absolute: 0 10*3/uL (ref 0.0–0.1)
Basophils Relative: 1 %
Eosinophils Absolute: 0.1 10*3/uL (ref 0.0–0.5)
Eosinophils Relative: 4 %
HCT: 30.3 % — ABNORMAL LOW (ref 36.0–46.0)
Hemoglobin: 9.7 g/dL — ABNORMAL LOW (ref 12.0–15.0)
Immature Granulocytes: 0 %
Lymphocytes Relative: 26 %
Lymphs Abs: 0.9 10*3/uL (ref 0.7–4.0)
MCH: 27.1 pg (ref 26.0–34.0)
MCHC: 32 g/dL (ref 30.0–36.0)
MCV: 84.6 fL (ref 80.0–100.0)
Monocytes Absolute: 0.3 10*3/uL (ref 0.1–1.0)
Monocytes Relative: 11 %
Neutro Abs: 1.9 10*3/uL (ref 1.7–7.7)
Neutrophils Relative %: 58 %
Platelets: 130 10*3/uL — ABNORMAL LOW (ref 150–400)
RBC: 3.58 MIL/uL — ABNORMAL LOW (ref 3.87–5.11)
RDW: 17.4 % — ABNORMAL HIGH (ref 11.5–15.5)
WBC: 3.3 10*3/uL — ABNORMAL LOW (ref 4.0–10.5)
nRBC: 0 % (ref 0.0–0.2)

## 2020-03-05 LAB — COMPREHENSIVE METABOLIC PANEL
ALT: 20 U/L (ref 0–44)
AST: 39 U/L (ref 15–41)
Albumin: 2.9 g/dL — ABNORMAL LOW (ref 3.5–5.0)
Alkaline Phosphatase: 151 U/L — ABNORMAL HIGH (ref 38–126)
Anion gap: 8 (ref 5–15)
BUN: 12 mg/dL (ref 6–20)
CO2: 25 mmol/L (ref 22–32)
Calcium: 8.9 mg/dL (ref 8.9–10.3)
Chloride: 103 mmol/L (ref 98–111)
Creatinine, Ser: 0.4 mg/dL — ABNORMAL LOW (ref 0.44–1.00)
GFR calc Af Amer: >60 mL/min (ref >60)
GFR calc non Af Amer: >60 mL/min (ref >60)
Glucose, Bld: 180 mg/dL — ABNORMAL HIGH (ref 70–99)
Potassium: 3.3 mmol/L — ABNORMAL LOW (ref 3.5–5.1)
Sodium: 136 mmol/L (ref 135–145)
Total Bilirubin: 1 mg/dL (ref 0.3–1.2)
Total Protein: 6.4 g/dL — ABNORMAL LOW (ref 6.5–8.1)

## 2020-03-05 LAB — MAGNESIUM: Magnesium: 1.9 mg/dL (ref 1.7–2.4)

## 2020-03-05 LAB — PHOSPHORUS: Phosphorus: 4.2 mg/dL (ref 2.5–4.6)

## 2020-03-05 MED ORDER — OXYCODONE HCL ER 10 MG PO T12A
10.0000 mg | EXTENDED_RELEASE_TABLET | Freq: Two times a day (BID) | ORAL | 0 refills | Status: AC
Start: 2020-03-05 — End: 2020-03-10

## 2020-03-05 MED ORDER — FUROSEMIDE 10 MG/ML IJ SOLN: 60.0000 mg | Freq: Once | INTRAMUSCULAR | Status: DC

## 2020-03-05 MED ORDER — IOHEXOL 350 MG/ML SOLN
75.0000 mL | Freq: Once | INTRAVENOUS | Status: AC | PRN
  Administered 2020-03-05: 75 mL via INTRAVENOUS

## 2020-03-05 MED ORDER — DULOXETINE HCL 30 MG PO CPEP
30.0000 mg | ORAL_CAPSULE | Freq: Every day | ORAL | 0 refills | Status: DC
Start: 2020-03-05 — End: 2020-05-13

## 2020-03-05 MED ORDER — GABAPENTIN 400 MG PO CAPS
400.0000 mg | ORAL_CAPSULE | Freq: Three times a day (TID) | ORAL | 0 refills | Status: DC
Start: 2020-03-05 — End: 2020-05-13

## 2020-03-05 MED ORDER — SALINE SPRAY 0.65 % NA SOLN: 1.0000 | NASAL | 0 refills | Status: DC | PRN

## 2020-03-05 MED ORDER — POTASSIUM CHLORIDE CRYS ER 20 MEQ PO TBCR: 40.0000 meq | EXTENDED_RELEASE_TABLET | Freq: Two times a day (BID) | ORAL | Status: DC

## 2020-03-05 MED ORDER — ENSURE MAX PROTEIN PO LIQD: 11.0000 [oz_av] | Freq: Every day | ORAL | 0 refills | Status: DC

## 2020-03-05 MED ORDER — OXYCODONE HCL ER 10 MG PO T12A
10.0000 mg | EXTENDED_RELEASE_TABLET | Freq: Two times a day (BID) | ORAL | 0 refills | Status: DC
Start: 2020-03-05 — End: 2020-03-05

## 2020-03-05 MED ORDER — METOPROLOL SUCCINATE ER 50 MG PO TB24
50.0000 mg | ORAL_TABLET | Freq: Every day | ORAL | 0 refills | Status: DC
Start: 2020-03-06 — End: 2020-04-22

## 2020-03-05 NOTE — Discharge Summary (Signed)
Physician Discharge Summary  Rhonda Martin VWU:[id number] DOB: [birth year] DOA: 02/14/2020  PCP: Volney American, PA-C  Admit date: 02/14/2020 Discharge date: 03/05/2020  Admitted From: Home Disposition:  Home (Unable to go to SNF and no [facility] would take her)  Recommendations for Outpatient Follow-up:  1. Follow up with PCP in 1-2 weeks 2. Follow up with Vascular Surgery within 1-2 weeks 3. Follow up with Psychiatry within 1-2 weeks 4. Follow-up with pain clinic in outpatient setting 5. Please obtain CMP/CBC, Mag, Phos in one week 6. Please follow up on the following pending results:  Home Health: No Equipment/Devices: None  Discharge Condition: Stable CODE STATUS: FULL CODE  Diet recommendation: Heart Healthy Diet  Brief/Interim Summary: The patient is a 44 year old Caucasian female with a past medical history significant for but not limited to Charcot-Marie-Tooth disease, alcohol abuse, anxiety and depression, hepatitis C, history of narcotic and polysubstance abuse, liver cirrhosis, chronic pain syndrome as well as other comorbidities who underwent a right total knee arthroplasty on 07/18/5619 that was complicated by wound dehiscence and bleeding for which that she had a total right knee revision on 02/04/2020 by Dr. Rudene Christians.  At that time she was apparently found to have osteomyelitis subsequently and presented with acute onset of suspect dislocation and worsening swelling tenderness and pain of her right knee.  She is found to have a ruptured patella tendon at that time was discharged on 02/09/2020 after being hospitalized for sepsis secondary right knee wound infection after right recent total knee arthroplasty and medical noncompliance.  During that time IV had recommend on discharge the patient be discharged on doxycycline 100 mg twice a day for 6 weeks as well as oral fluconazole for 6 weeks.  She subsequently presented to the hospital again and was hospitalized on 02/15/2020  and since she feels her right total knee replacement vascular surgery was consulted by Dr. 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  PT OT evaluated and recommending SNF discharge however currently there is no bed available and she is unsafe to discharge home as she is still pending.  Husband is going to build a ramp for the patient and she can likely be discharged once cleared by PT OT and when she is worked with steroid therapy.  Currently the recommendation is still for SNF unless she can have the ramp built for access and stair training.  She states that her husband is close to finishing her up in a few days and when asked about it today she states that it was not done and she needs more time.  She continued to complain of significant pain still but will not give her any more Toradol given epistaxis. She continues to have Oxycontin, Oxycodone, and Tramadol but despite this has significant pain.  Last night she had an unwitnessed fall and this morning she went for head CT was negative.  She is awake and alert and oriented despite nursing stating that she is confused morning.  Her D-dimer is elevated so we will obtain a CT of the chest which showed no evidence of PE. Is improving.  Currently she is stable to be discharged now given that she cannot go to SNF and her ramp is built.  She will need to follow-up with PCP as well as vascular surgeon outpatient setting and go to her pain clinic for further adjustments  Discharge Diagnoses:  Active Problems:   Weakness   Generalized anxiety disorder   HTN (hypertension)  Osteomyelitis of right lower extremity (HCC)   Ischemic chest pain ([place])  SIRS with Low-grade Fever/Tachycardic(on 02/26/2020); Admission Sepsis Resolved  -Resolved -Hemodynamically stable, labs within normal limits no signs of infection. -Denies of having any dysuria shortness of breath or chest pain, denies any cough or congestion. -WBC 7.4 then and not repeated,  lactic acid 1.4 with repeat of 1.5, procalcitonin <0.10 -Chest x-ray reviewed, improved from the last chest x-ray, finding consistent with improving pneumonia versus edema -Repeat WBC yesterday AM was 3.1; Continue to be Tachycardic -Check D-Dimer, LE Duplex and Procalctonin level again given LLE Swelling; see below -We will abstain from any antibiotics use at this time  -Continue to Monitor Closely and has had any more fevers now  Failed right total knee replacement: with associated right lower extremity osteomyelitis and severe nonpurulent knee cellulitis.  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 Surgical path showed "RIGHT LEG, ABOVE KNEE AMPUTATION:  - SUBCUTANEOUS TISSUE AND SKELETAL MUSCLE WITH ABSCESS AND NECROSIS.  - CHRONIC OSTEOMYELITIS WITH BONE REMODELING.  - NEGATIVE FOR MALIGNANCY.  - CHRONIC INFLAMMATORY CHANGES ARE PRESENT WITHIN BONE AT THE PROXIMAL  RESECTION MARGIN. " -Previous knee cultures grew stap epi and candidia parapsilosis. W/ wound dehiscence, noncompliance w/ medication and alcohol/drug abuse, ortho surg felt the infection will not resolve so plan was for a R AKA. 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  -Continue on oxycodone, gabapentin, oxycontin as pt has severe pain.  -Continues to complain of pain but has been on chronic pain medication appears comfortable today  Hyponatremia -Patient's Na+ now 136 -Repeat CMP as an outpatient  HTN -Last blood pressure was 115/80 -Continue monitoring,  -Continue home medication Metoprolol Succinate 25 mg po Daily and Furosemide 20 mg po daily and with Spironolactone 25 mg po Daily; we will increase her metoprolol succinate to 50 mg p.o. daily given her elevation in her heart rate -No longer on as needed Hydralazine  Unwitnessed fall  -likely in the setting of patient not using her call bell and try to get out of bed by herself -Head CT without contrast done  and showed "Stable degree of cerebellar atrophy. Ventricles and sulci otherwise appear within normal limits. Brain parenchyma appears unremarkable without evident acute infarct. No mass or hemorrhage." -He is awake and alert and oriented x3 Stable to be discharged home  Elevated D-dimer -Unclear etiology b utD-dimer was 865.64 -Lower extremity venous  duplex of the left was negative for DVT -CTA of the chest showed "No demonstrable pulmonary embolus. No evident thoracic aortic aneurysm or dissection. Underlying emphysematous change. Bibasilar atelectasis with small right pleural effusion. No consolidation. No evident adenopathy. Apparent splenomegaly.  Spleen incompletely visualized." -PE ruled out and likely this is reactive  GERD -Continue Pantoprazole 40 mg po Daily  -C/w Sucralfate 1 gram po TIDwm and qHS  Peripheral Neuropathy:  -Stable, continue home dose Gabapentin 400 mg po TID -Slowly tapering down narcotics and discharged as below  Acute on Chronic Pain Syndrome -Again no changes to po medications today but her request to go up on her pain medication -Dr. Jamesetta Geralds had an extensive discussion regarding pain management was held with patient she expressed understanding seem to be cooperative -Current Pain Regimen: OxyIR from 5 mg every 6 hours as needed, continue Ultram 50 mg every 6 hours as needed, Gabapentin 400 mg p.o. 3 times daily -Because she was still complaining of significant amount of pain we have given  her a dose of IV ketorolac 30 mg x 1 but will not give anymore given her epistaxis.  -With chronic pain clinic as she has had buprenorphine in the past.  We will stop her p.o. tramadol and continue her oxycodone IR 5 mg every 6 hours as needed.  We will give her a short 10 pill prescription of the OxyContin 10 every 12 and she will need to follow-up with pain clinic  Depression and Anxiety -C/w Duloxetine 30 mg p.o. daily, Hydroxyzine 25 mg p.o. 3 times daily as needed  anxiety, Olanzapine 10 mg p.o. nightly  Epistaxis -Has been having nose bleeds since last evening and now stopped -Likely due to Dryness -Will start [place] off on NSAIDS and will not give any more IV Toradol  -Nosebleeding stopped  Cirrhosis and Hx of Hepatitis C -LFTs stable and no signs of asterixis -Last Ammonia was 43 on 02/20/20 -Did have some drowsiness today but was not noted to have encephalopathy -LFTs are normal now on last check  -Continue Lactulose 30 grams TID, Furosemide 20 mg po Daily and Spironolactone 25 mg po Daily at discharge  Sinus Tachycardia -Improving with increasing of her metoprolol; Now on 50 mg po Metoprolol Succinate up from her 12.5 mg home regimen -Continue to monitor per protocol -Follow-up for further adjustments in outpatient setting   Insomnia -Changed Zolpidem from PRN to Scheduled  Left Leg Swelling in the setting of hypoalbuminemia and poor nutritional status -In the setting of Venous Stasis and Dependent edmea as well as Hypoalbuinemia -Check LE VENOUS DUPLEX to r/o DVT -Place IV and give IV Lasix 60 mg x1 and Albumin 25 g yesterday and will give another dose of IV Lasix today. -Albumin was 2.6 -Has been Hobbling on Left Leg and does not elevate when sleeping -Elevate Leg Above the Level of the Heart -Check D-Dimer was elevated so checked a CTA which showed no evidence of PE next- -continue with daily diuretics spironolactone furosemide and have PCP follow and adjust meds -Leg swelling is improving and she is stable for discharge  Hypokalemia -Potassium is now 3.3 in the setting of IV Lasix -replete with p.o. potassium chloride 40 mg twice daily x2 doses -Repeat CBC in outpatient setting  Thrombocytopenia -Patient's platelet count has now improved and stabilized and was 130,000 today -No signs and symptoms of bleeding -Repeat CBC intermittently  Abnormal LFTs/Transaminitis -Likely secondary to  cirrhosis.  -LFTs improved and normalized -Repeat CMP intermittently   Charcot-Marie Tooth Disease -Continue w/ supportive care   Normocytic Anemia -Patient's Hgb/Hct has been relatively stable and is now 9.7/3.3 -C/w Ferrous Sulfate 325 mg po Daily with Breakfast Check anemia panel in a.m. -Continue to monitor for signs and symptoms of bleeding; currently no overt bleeding noted and does not have any drainage around her stump site. -Continue to Monitor for S/Sx of Bleeding; Currently no overt bleeding noted -Repeat CBC intermittently  Discharge Instructions  Discharge Instructions    Call MD for:  difficulty breathing, headache or visual disturbances   Complete by: As directed    Call MD for:  extreme fatigue   Complete by: As directed    Call MD for:  hives   Complete by: As directed    Call MD for:  persistant dizziness or light-headedness   Complete by: As directed    Call MD for:  persistant nausea and vomiting   Complete by: As directed    Call MD for:  redness, tenderness, or signs  of infection (pain, swelling, redness, odor or green/yellow discharge around incision site)   Complete by: As directed    Call MD for:  severe uncontrolled pain   Complete by: As directed    Call MD for:  temperature >100.4   Complete by: As directed    Diet - low sodium heart healthy   Complete by: As directed    Discharge instructions   Complete by: As directed    You were cared for by a hospitalist during your hospital stay. If you have any questions about your discharge medications or the care you received while you were in the hospital after you are discharged, you can call the unit and ask to speak with the hospitalist on call if the hospitalist that took care of you is not available. Once you are discharged, your primary care physician will handle any further medical issues. Please note that NO REFILLS for any discharge medications will be authorized once you are discharged, as it is  imperative that you return to your primary care physician (or establish a relationship with a primary care physician if you do not have one) for your aftercare needs so that they can reassess your need for medications and monitor your lab values.  Follow up with PCP, Vascular Surgery, and Orthopedics in the outpatient setting. Take all medications as prescribed. If symptoms change or worsen please return to the ED for evaluation   Discharge wound care:   Complete by: As directed    Per Vascular Surgery Reccs   Increase activity slowly   Complete by: As directed      Allergies as of 03/05/2020      Reactions   Tylenol [acetaminophen] Other (See Comments)   Liver disease      Medication List    STOP taking these medications   doxycycline 100 MG tablet Commonly known as: ADOXA   fluconazole 200 MG tablet Commonly known as: DIFLUCAN     TAKE these medications   aspirin 81 MG chewable tablet Chew 1 tablet (81 mg total) by mouth 2 (two) times daily.   Blood Pressure Monitor Misc For automatic blood pressure cuff.   dicyclomine 20 MG tablet Commonly known as: BENTYL Take 1 tablet (20 mg total) by mouth 4 (four) times daily -  before meals and at bedtime.   DULoxetine 30 MG capsule Commonly known as: CYMBALTA Take 1 capsule (30 mg total) by mouth daily. What changed:   medication strength  how much to take   Ensure Max Protein Liqd Take 330 mLs (11 oz total) by mouth daily.   ferrous sulfate 325 (65 FE) MG EC tablet Take 1 tablet (325 mg total) by mouth daily with breakfast.   folic acid 1 MG tablet Commonly known as: FOLVITE Take 1 tablet (1 mg total) by mouth daily.   furosemide 20 MG tablet Commonly known as: LASIX Take 20 mg by mouth daily.   gabapentin 400 MG capsule Commonly known as: NEURONTIN Take 1 capsule (400 mg total) by mouth 3 (three) times daily. What changed:   medication strength  how much to take  when to take this  reasons to take  this   hydrOXYzine 25 MG tablet Commonly known as: ATARAX/VISTARIL Take 1 tablet (25 mg total) by mouth 3 (three) times daily as needed (anxiety.).   ibuprofen 200 MG tablet Commonly known as: ADVIL Take 1 tablet (200 mg total) by mouth every 6 (six) hours as needed for fever or moderate pain.  lactulose 10 GM/15ML solution Commonly known as: CHRONULAC Take 45 mLs (30 g total) by mouth 3 (three) times daily.   magnesium oxide 400 (241.3 Mg) MG tablet Commonly known as: MAG-OX Take 1 tablet (400 mg total) by mouth daily.   metoprolol succinate 50 MG 24 hr tablet Commonly known as: TOPROL-XL Take 1 tablet (50 mg total) by mouth daily. Take with or immediately following a meal. Start taking on: March 06, 2020 What changed:   medication strength  how much to take  additional instructions   midodrine 5 MG tablet Commonly known as: PROAMATINE Take 1 tablet (5 mg total) by mouth in the morning and at bedtime. Pt is taking as needed bp is good   multivitamin with minerals Tabs tablet Take 1 tablet by mouth daily.   OLANZapine 10 MG tablet Commonly known as: ZYPREXA Take 1 tablet (10 mg total) by mouth at bedtime.   ondansetron 4 MG tablet Commonly known as: ZOFRAN Take 1 tablet (4 mg total) by mouth every 6 (six) hours as needed for nausea.   oxyCODONE 5 MG immediate release tablet Commonly known as: Oxy IR/ROXICODONE Take 1 tablet (5 mg total) by mouth every 6 (six) hours as needed for severe pain.   oxyCODONE 10 mg 12 hr tablet Commonly known as: OXYCONTIN Take 1 tablet (10 mg total) by mouth every 12 (twelve) hours for 5 days.   pantoprazole 40 MG tablet Commonly known as: PROTONIX Take 1 tablet (40 mg total) by mouth daily.   potassium chloride SA 20 MEQ tablet Commonly known as: KLOR-CON Take 1 tablet (20 mEq total) by mouth daily.   senna 8.6 MG Tabs tablet Commonly known as: SENOKOT Take 1 tablet (8.6 mg total) by mouth 2 (two) times daily.    sodium chloride 0.65 % Soln nasal spray Commonly known as: OCEAN Place 1 spray into both nostrils as needed for congestion.   spironolactone 25 MG tablet Commonly known as: ALDACTONE Take 25 mg by mouth daily.   sucralfate 1 g tablet Commonly known as: Carafate Take 1 tablet (1 g total) by mouth 4 (four) times daily -  with meals and at bedtime.   thiamine 100 MG tablet Take 1 tablet (100 mg total) by mouth daily.   triamcinolone cream 0.1 % Commonly known as: KENALOG Apply 1 application topically 2 (two) times daily as needed (skin irritation).   vitamin B-12 500 MCG tablet Commonly known as: CYANOCOBALAMIN Take 1 tablet (500 mcg total) by mouth daily.   zolpidem 10 MG tablet Commonly known as: AMBIEN Take 10 mg by mouth at bedtime as needed.            Discharge Care Instructions  (From admission, onward)         Start     Ordered   03/05/20 0000  Discharge wound care:       Comments: Per Vascular Surgery Reccs   03/05/20 1226          Follow-up Information    Kris Hartmann, NP Follow up in 3 week(s).   Specialty: Vascular Surgery Why: Seen as consult. First postop visit. No studies needed.  Contact information: [place] [id number] [phone]              Allergies  Allergen Reactions  . Tylenol [Acetaminophen] Other (See Comments)    Liver disease    Consultations:  Orthopedic Surgery  Vascular Surgery  Infectious Diseases  Procedures/Studies: DG Knee 1-2 Views Right  Result Date: 02/14/2020 CLINICAL DATA:  Infected right knee arthroplasty status post removal, right knee injury EXAM: RIGHT KNEE - 1-2 VIEW COMPARISON:  02/04/2020 FINDINGS: Frontal and cross-table lateral views of the right knee are obtained. There is severe lateral subluxation of the tibia relative to the distal femur. The comminuted fracture through the tibial metadiaphyseal junction seen previously is again noted, with slight increased displacement.  Progressive bony resorption along the distal femur and proximal tibia consistent with osteomyelitis. There is diffuse soft tissue edema surrounding the right knee. Large knee effusion is suspected. Wound VAC device is in place. There is some residual antibiotic beads at the site of previous orthopedic hardware. IMPRESSION: 1. Persistent fracture of the proximal tibia as above. 2. Interval severe lateral subluxation of the tibia relative to the distal femur. 3. Bony resorption along the distal femur proximal tibia consistent with progressive osteomyelitis. Electronically Signed   By: Randa Ngo M.D.   On: 02/14/2020 18:30   CT HEAD WO CONTRAST  Result Date: 03/05/2020 CLINICAL DATA:  Altered mental status EXAM: CT HEAD WITHOUT CONTRAST TECHNIQUE: Contiguous axial images were obtained from the base of the skull through the vertex without intravenous contrast. COMPARISON:  August 22, 2019 FINDINGS: Brain: Ventricles and sulci are normal in size and configuration. There is a degree of cerebellar atrophy, stable. No appreciable intracranial mass, hemorrhage, extra-axial fluid collection, or midline shift. Brain parenchyma appears unremarkable. No appreciable acute infarct. Vascular: No hyperdense vessel. No appreciable vascular calcification. Skull: Bony calvarium appears intact. Sinuses/Orbits: Visualized paranasal sinuses are clear. Orbits appear symmetric bilaterally. Other: Mastoid air cells are clear. IMPRESSION: Stable degree of cerebellar atrophy. Ventricles and sulci otherwise appear within normal limits. Brain parenchyma appears unremarkable without evident acute infarct. No mass or hemorrhage. Electronically Signed   By: Lowella Grip III M.D.   On: 03/05/2020 11:28   CT ANGIO CHEST PE W OR WO CONTRAST  Result Date: 03/05/2020 CLINICAL DATA:  Positive D-dimer study with lower extremity edema EXAM: CT ANGIOGRAPHY CHEST WITH CONTRAST TECHNIQUE: Multidetector CT imaging of the chest was performed  using the standard protocol during bolus administration of intravenous contrast. Multiplanar CT image reconstructions and MIPs were obtained to evaluate the vascular anatomy. CONTRAST:  19mL OMNIPAQUE IOHEXOL 350 MG/ML SOLN COMPARISON:  CT angiogram chest August 22, 2019; chest radiograph February 26, 2020 FINDINGS: Cardiovascular: There is no demonstrable pulmonary embolus. There is no thoracic aortic aneurysm or dissection. Calcification is noted at the origin of the left subclavian artery. Visualized great vessels otherwise appear unremarkable. No pericardial effusion or pericardial thickening evident. Mediastinum/Nodes: Thyroid appears normal. There is no appreciable thoracic adenopathy. No appreciable esophageal lesions. Lungs/Pleura: There is bibasilar atelectatic change with small right pleural effusion. No edema or airspace opacity evident. There is a degree of underlying emphysematous change. Upper Abdomen: Spleen incompletely visualized but appears enlarged. Spleen measures 15.3 cm from anterior to posterior dimension. Visualized upper abdominal structures otherwise appear unremarkable. Musculoskeletal: There is stable anterior wedging at T10. No blastic or lytic bone lesions. No chest wall lesions. Review of the MIP images confirms the above findings. IMPRESSION: 1. No demonstrable pulmonary embolus. No evident thoracic aortic aneurysm or dissection. 2. Underlying emphysematous change. Bibasilar atelectasis with small right pleural effusion. No consolidation. 3.  No evident adenopathy. 4.  Apparent splenomegaly.  Spleen incompletely visualized. Electronically Signed   By: Lowella Grip III M.D.   On: 03/05/2020 11:25   US Venous Img Lower Unilateral Left (DVT)  Result Date: 03/05/2020 CLINICAL DATA:  Left lower extremity pain and edema EXAM: LEFT LOWER EXTREMITY VENOUS DOPPLER ULTRASOUND TECHNIQUE: Gray-scale sonography with graded compression, as well as color Doppler and duplex ultrasound were  performed to evaluate the lower extremity deep venous systems from the level of the common femoral vein and including the common femoral, femoral, profunda femoral, popliteal and calf veins including the posterior tibial, peroneal and gastrocnemius veins when visible. The superficial great saphenous vein was also interrogated. Spectral Doppler was utilized to evaluate flow at rest and with distal augmentation maneuvers in the common femoral, femoral and popliteal veins. COMPARISON:  12/10/2019 FINDINGS: Contralateral Common Femoral Vein: Respiratory phasicity is normal and symmetric with the symptomatic side. No evidence of thrombus. Normal compressibility. Common Femoral Vein: No evidence of thrombus. Normal compressibility, respiratory phasicity and response to augmentation. Saphenofemoral Junction: No evidence of thrombus. Normal compressibility and flow on color Doppler imaging. Profunda Femoral Vein: No evidence of thrombus. Normal compressibility and flow on color Doppler imaging. Femoral Vein: No evidence of thrombus. Normal compressibility, respiratory phasicity and response to augmentation. Popliteal Vein: No evidence of thrombus. Normal compressibility, respiratory phasicity and response to augmentation. Calf Veins: No evidence of thrombus. Normal compressibility and flow on color Doppler imaging. IMPRESSION: No evidence of deep venous thrombosis. Electronically Signed   By: Jerilynn Mages.  Shick M.D.   On: 03/05/2020 08:10   DG Chest Port 1 View  Result Date: 02/26/2020 CLINICAL DATA:  Fever, tachycardia EXAM: PORTABLE CHEST 1 VIEW COMPARISON:  01/16/2020 FINDINGS: The heart size and mediastinal contours are within normal limits. Mild interstitial prominence throughout the bilateral lung fields, which has slightly improved compared to the prior. No lobar airspace consolidation. No pleural effusion or pneumothorax. The visualized skeletal structures are unremarkable. IMPRESSION: Mild interstitial prominence  throughout the bilateral lung fields, which has slightly improved compared to the prior radiograph. Findings may represent improving pneumonitis or edema. Electronically Signed   By: Davina Poke D.O.   On: 02/26/2020 09:26     Subjective: Seen and examined at bedside and she is doing okay.  Had a witnessed fall last night and states that she try to get out of bed by herself.  Not confused this morning.  D-dimer is elevated so obtain a CT of the chest which was negative.  Ramp is developed so she has been deemed stable to be discharged home and will need to follow-up with PCP as well as vascular surgeon outpatient setting.  Answered all her questions to her satisfaction.   Discharge Exam: Vitals:   03/04/20 2230 03/05/20 0700  BP: 113/61 115/80  Pulse: (!) 107 98  Resp: 18 17  Temp: 98.6 F (37 C) 98.5 F (36.9 C)  SpO2: 96% 99%   Vitals:   03/04/20 1958 03/04/20 2031 03/04/20 2230 03/05/20 0700  BP: (!) 104/59 (!) 141/75 113/61 115/80  Pulse: 97 (!) 102 (!) 107 98  Resp: 18  18 17   Temp: 98.9 F (37.2 C) 98.1 F (36.7 C) 98.6 F (37 C) 98.5 F (36.9 C)  TempSrc: Oral Oral Oral Oral  SpO2: 99% 99% 96% 99%  Weight:      Height:       General: Pt is alert, awake, not in acute distress Cardiovascular: Mildly tachycardic, S1/S2 +, no rubs, no gallops Respiratory: Diminished bilaterally, no wheezing, no rhonchi; unlabored breathing Abdominal: Soft, NT, ND, bowel sounds + Extremities: Right AKA; has some left lower extremity swelling  The results of significant diagnostics from this hospitalization (including imaging, microbiology, ancillary and laboratory) are  listed below for reference.    Microbiology: No results found for this or any previous visit (from the past 240 hour(s)).   Labs: BNP (last 3 results) Recent Labs    12/10/19 1305  BNP 84.6   Basic Metabolic Panel: Recent Labs  Lab 02/29/20 0614 03/03/20 0517 03/05/20 0925  NA 139 141 136  K 3.6 3.9 3.3*   CL 106 109 103  CO2 25 25 25   GLUCOSE 108* 130* 180*  BUN 9 11 12   CREATININE 0.41* 0.37* 0.40*  CALCIUM 8.6* 8.7* 8.9  MG 2.0 1.9 1.9  PHOS 3.9 4.8* 4.2   Liver Function Tests: Recent Labs  Lab 02/29/20 0614 03/03/20 0517 03/05/20 0925  AST 37 38 39  ALT 20 20 20   ALKPHOS 120 148* 151*  BILITOT 0.8 0.9 1.0  PROT 6.5 6.4* 6.4*  ALBUMIN 2.6* 2.6* 2.9*   No results for input(s): LIPASE, AMYLASE in the last 168 hours. No results for input(s): AMMONIA in the last 168 hours. CBC: Recent Labs  Lab 02/29/20 0614 03/01/20 0517 03/03/20 0517 03/05/20 0925  WBC 4.4 4.0 3.1* 3.3*  NEUTROABS 2.7 2.3 1.7 1.9  HGB 8.7* 9.0* 9.2* 9.7*  HCT 27.6* 27.5* 30.0* 30.3*  MCV 88.2 86.5 89.3 84.6  PLT 134* 141* 127* 130*   Cardiac Enzymes: No results for input(s): CKTOTAL, CKMB, CKMBINDEX, TROPONINI in the last 168 hours. BNP: Invalid input(s): POCBNP CBG: No results for input(s): GLUCAP in the last 168 hours. D-Dimer No results for input(s): DDIMER in the last 72 hours. Hgb A1c No results for input(s): HGBA1C in the last 72 hours. Lipid Profile No results for input(s): CHOL, HDL, LDLCALC, TRIG, CHOLHDL, LDLDIRECT in the last 72 hours. Thyroid function studies No results for input(s): TSH, T4TOTAL, T3FREE, THYROIDAB in the last 72 hours.  Invalid input(s): FREET3 Anemia work up No results for input(s): VITAMINB12, FOLATE, FERRITIN, TIBC, IRON, RETICCTPCT in the last 72 hours. Urinalysis    Component Value Date/Time   COLORURINE YELLOW (A) 01/14/2020 0827   APPEARANCEUR HAZY (A) 01/14/2020 0827   APPEARANCEUR Clear 10/09/2019 1448   LABSPEC 1.013 01/14/2020 0827   PHURINE 6.0 01/14/2020 0827   GLUCOSEU NEGATIVE 01/14/2020 0827   HGBUR NEGATIVE 01/14/2020 0827   BILIRUBINUR NEGATIVE 01/14/2020 0827   BILIRUBINUR Negative 10/09/2019 1448   KETONESUR NEGATIVE 01/14/2020 0827   PROTEINUR NEGATIVE 01/14/2020 0827   NITRITE NEGATIVE 01/14/2020 0827   LEUKOCYTESUR MODERATE  (A) 01/14/2020 0827   Sepsis Labs Invalid input(s): PROCALCITONIN,  WBC,  LACTICIDVEN Microbiology No results found for this or any previous visit (from the past 240 hour(s)).  Time coordinating discharge: 35 minutes  SIGNED:  Kerney Elbe, DO [facility] 03/05/2020, 6:53 PM Pager is on [place]  If 7PM-7AM, please contact night-coverage [email or web address]

## 2020-03-05 NOTE — Plan of Care (Signed)
  Problem: Health Behavior/Discharge Planning: Goal: Ability to manage health-related needs will improve Outcome: Progressing   Problem: Clinical Measurements: Goal: Ability to maintain clinical measurements within normal limits will improve Outcome: Progressing Goal: Will remain free from infection Outcome: Progressing Goal: Diagnostic test results will improve Outcome: Progressing Goal: Respiratory complications will improve Outcome: Progressing Goal: Cardiovascular complication will be avoided Outcome: Progressing   Problem: Activity: Goal: Risk for activity intolerance will decrease Outcome: Progressing   Problem: Nutrition: Goal: Adequate nutrition will be maintained Outcome: Progressing   Problem: Coping: Goal: Level of anxiety will decrease Outcome: Progressing   Problem: Elimination: Goal: Will not experience complications related to bowel motility Outcome: Progressing Goal: Will not experience complications related to urinary retention Outcome: Progressing   Problem: Pain Managment: Goal: General experience of comfort will improve Outcome: Progressing   Problem: Safety: Goal: Ability to remain free from injury will improve Outcome: Progressing   Problem: Education: Goal: Ability to verbalize activity precautions or restrictions will improve Outcome: Progressing   Problem: Activity: Goal: Ability to perform//tolerate increased activity and mobilize with assistive devices will improve Outcome: Progressing   Problem: Self-Care: Goal: Ability to meet self-care needs will improve Outcome: Progressing  Reviewed with pt fall prevention - use call bell prior to getting oob - , stop turning own bed alarm off, wait for staff to assist pt oob to bsc for safety - pt offered many excuses and RN re educated her to please use call bell system for her own safety.  Will continue to reinforce appropriate teaching

## 2020-03-05 NOTE — Progress Notes (Signed)
OT Cancellation Note  Patient Details Name: Rhonda Martin MRN: [id number] DOB: 1976-02-05   Cancelled Treatment:    Reason Eval/Treat Not Completed: Other (comment). Chart reviewed. Pt now pending head CT and CT angio for possible PE. Will hold therapy at this time and re-attempt at later date/time pending imaging and updated plan of care.   Jeni Salles, MPH, MS, OTR/L ascom [phone] 03/05/20, 9:51 AM

## 2020-03-05 NOTE — Progress Notes (Signed)
Secure chat NP Ouma about patient waking up confused about place, time and situation, states "I'm sorry I am still half asleep" Was fine after fall but is now seeming to be confused. She is constantly asking for pain medicine and anti anxiety medication which could have a lot to do with her confusion and drowsiness. NP Ouma responded "Noted"

## 2020-03-05 NOTE — Plan of Care (Signed)
D /c completed - all ppw reviewed with pt and understanding is voiced.  Pt denies concerns and is transported via wc to front of hospital and assisted to car w/ husband

## 2020-03-07 DIAGNOSIS — Z79899 Other long term (current) drug therapy: Secondary | ICD-10-CM | POA: Diagnosis not present

## 2020-03-08 ENCOUNTER — Telehealth: Payer: Self-pay
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
## 2020-03-11 NOTE — Telephone Encounter (Signed)
erroneous error  

## 2020-03-13 ENCOUNTER — Ambulatory Visit: Payer: Medicaid Other | Admitting: Licensed Clinical Social Worker
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
## 2020-03-19 ENCOUNTER — Other Ambulatory Visit: Payer: Self-pay

## 2020-03-19 ENCOUNTER — Ambulatory Visit (INDEPENDENT_AMBULATORY_CARE_PROVIDER_SITE_OTHER): Payer: Medicaid Other | Admitting: Nurse Practitioner

## 2020-03-19 VITALS — BP 107/74 | HR 102 | Ht 62.0 in | Wt 135.0 lb

## 2020-03-19 DIAGNOSIS — F1910 Other psychoactive substance abuse, uncomplicated: Secondary | ICD-10-CM | POA: Insufficient documentation

## 2020-03-19 DIAGNOSIS — Z89611 Acquired absence of right leg above knee: Secondary | ICD-10-CM

## 2020-03-19 DIAGNOSIS — I10 Essential (primary) hypertension: Secondary | ICD-10-CM

## 2020-03-24 ENCOUNTER — Encounter (INDEPENDENT_AMBULATORY_CARE_PROVIDER_SITE_OTHER): Payer: Self-pay | Admitting: Nurse Practitioner
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
## 2020-03-29 NOTE — Telephone Encounter (Signed)
LMOM advising caller to return my call directly at [phone]. . 

## 2020-04-01 ENCOUNTER — Ambulatory Visit: Payer: Medicaid Other | Admitting: Physical Therapy

## 2020-04-02 ENCOUNTER — Emergency Department: Payer: Medicaid Other

## 2020-04-02 ENCOUNTER — Emergency Department
Admission: EM | Admit: 2020-04-02 | Discharge: 2020-04-02 | Disposition: A | Discharge status: Home / Self Care | Payer: Medicaid Other | Attending: Student in an Organized Health Care Education/Training Program | Admitting: Student in an Organized Health Care Education/Training Program

## 2020-04-02 ENCOUNTER — Other Ambulatory Visit: Payer: Self-pay

## 2020-04-02 DIAGNOSIS — R112 Nausea with vomiting, unspecified: Secondary | ICD-10-CM | POA: Diagnosis not present

## 2020-04-02 DIAGNOSIS — I10 Essential (primary) hypertension: Secondary | ICD-10-CM | POA: Insufficient documentation

## 2020-04-02 DIAGNOSIS — Z8541 Personal history of malignant neoplasm of cervix uteri: Secondary | ICD-10-CM | POA: Insufficient documentation

## 2020-04-02 DIAGNOSIS — R1011 Right upper quadrant pain: Secondary | ICD-10-CM | POA: Insufficient documentation

## 2020-04-02 DIAGNOSIS — R059 Cough, unspecified: Secondary | ICD-10-CM | POA: Insufficient documentation

## 2020-04-02 DIAGNOSIS — Z7982 Long term (current) use of aspirin: Secondary | ICD-10-CM | POA: Insufficient documentation

## 2020-04-02 DIAGNOSIS — R0981 Nasal congestion: Secondary | ICD-10-CM | POA: Diagnosis not present

## 2020-04-02 DIAGNOSIS — R040 Epistaxis: Secondary | ICD-10-CM

## 2020-04-02 DIAGNOSIS — Z7901 Long term (current) use of anticoagulants: Secondary | ICD-10-CM | POA: Diagnosis not present

## 2020-04-02 DIAGNOSIS — R5381 Other malaise: Secondary | ICD-10-CM | POA: Insufficient documentation

## 2020-04-02 DIAGNOSIS — Z20822 Contact with and (suspected) exposure to covid-19: Secondary | ICD-10-CM | POA: Insufficient documentation

## 2020-04-02 DIAGNOSIS — R21 Rash and other nonspecific skin eruption: Secondary | ICD-10-CM | POA: Diagnosis not present

## 2020-04-02 DIAGNOSIS — J449 Chronic obstructive pulmonary disease, unspecified: Secondary | ICD-10-CM | POA: Diagnosis not present

## 2020-04-02 DIAGNOSIS — K219 Gastro-esophageal reflux disease without esophagitis: Secondary | ICD-10-CM | POA: Diagnosis not present

## 2020-04-02 DIAGNOSIS — F17210 Nicotine dependence, cigarettes, uncomplicated: Secondary | ICD-10-CM | POA: Insufficient documentation

## 2020-04-02 DIAGNOSIS — Z89611 Acquired absence of right leg above knee: Secondary | ICD-10-CM | POA: Insufficient documentation

## 2020-04-02 DIAGNOSIS — Z79899 Other long term (current) drug therapy: Secondary | ICD-10-CM | POA: Diagnosis not present

## 2020-04-02 DIAGNOSIS — K8020 Calculus of gallbladder without cholecystitis without obstruction: Secondary | ICD-10-CM | POA: Diagnosis not present

## 2020-04-02 LAB — COMPREHENSIVE METABOLIC PANEL
ALT: 31 U/L (ref 0–44)
AST: 73 U/L — ABNORMAL HIGH (ref 15–41)
Albumin: 4.2 g/dL (ref 3.5–5.0)
Alkaline Phosphatase: 165 U/L — ABNORMAL HIGH (ref 38–126)
Anion gap: 13 (ref 5–15)
BUN: 6 mg/dL (ref 6–20)
CO2: 23 mmol/L (ref 22–32)
Calcium: 9.5 mg/dL (ref 8.9–10.3)
Chloride: 96 mmol/L — ABNORMAL LOW (ref 98–111)
Creatinine, Ser: 0.48 mg/dL (ref 0.44–1.00)
GFR, Estimated: >60 mL/min (ref >60)
Glucose, Bld: 120 mg/dL — ABNORMAL HIGH (ref 70–99)
Potassium: 3.9 mmol/L (ref 3.5–5.1)
Sodium: 132 mmol/L — ABNORMAL LOW (ref 135–145)
Total Bilirubin: 2.2 mg/dL — ABNORMAL HIGH (ref 0.3–1.2)
Total Protein: 9.4 g/dL — ABNORMAL HIGH (ref 6.5–8.1)

## 2020-04-02 LAB — CBC
HCT: 44.2 % (ref 36.0–46.0)
Hemoglobin: 14.5 g/dL (ref 12.0–15.0)
MCH: 26.2 pg (ref 26.0–34.0)
MCHC: 32.8 g/dL (ref 30.0–36.0)
MCV: 79.9 fL — ABNORMAL LOW (ref 80.0–100.0)
Platelets: 94 10*3/uL — ABNORMAL LOW (ref 150–400)
RBC: 5.53 MIL/uL — ABNORMAL HIGH (ref 3.87–5.11)
RDW: 17.7 % — ABNORMAL HIGH (ref 11.5–15.5)
WBC: 6.1 10*3/uL (ref 4.0–10.5)
nRBC: 0 % (ref 0.0–0.2)

## 2020-04-02 LAB — URINALYSIS, COMPLETE (UACMP) WITH MICROSCOPIC
Bacteria, UA: NONE SEEN
Bilirubin Urine: NEGATIVE
Glucose, UA: NEGATIVE mg/dL
Hgb urine dipstick: NEGATIVE
Ketones, ur: NEGATIVE mg/dL
Leukocytes,Ua: NEGATIVE
Nitrite: NEGATIVE
Protein, ur: NEGATIVE mg/dL
Specific Gravity, Urine: 1.011 (ref 1.005–1.030)
pH: 6 (ref 5.0–8.0)

## 2020-04-02 LAB — RESPIRATORY PANEL BY RT PCR (FLU A&B, COVID)
Influenza A by PCR: NEGATIVE
Influenza B by PCR: NEGATIVE
SARS Coronavirus 2 by RT PCR: NEGATIVE

## 2020-04-02 LAB — LIPASE, BLOOD: Lipase: 28 U/L (ref 11–51)

## 2020-04-02 MED ORDER — CHLORDIAZEPOXIDE HCL 5 MG PO CAPS
10.0000 mg | ORAL_CAPSULE | Freq: Once | ORAL | Status: AC
  Administered 2020-04-02: 10 mg via ORAL
  Filled 2020-04-02: qty 2

## 2020-04-02 MED ORDER — SILVER NITRATE-POT NITRATE 75-25 % EX MISC
1.0000 | Freq: Once | CUTANEOUS | Status: AC
  Administered 2020-04-02: 1 via TOPICAL

## 2020-04-02 MED ORDER — ONDANSETRON HCL 4 MG PO TABS: 4.0000 mg | ORAL_TABLET | Freq: Four times a day (QID) | ORAL | 0 refills | Status: DC | PRN

## 2020-04-02 MED ORDER — LORAZEPAM 1 MG PO TABS
1.0000 mg | ORAL_TABLET | Freq: Once | ORAL | Status: AC
  Administered 2020-04-02: 1 mg via ORAL
  Filled 2020-04-02: qty 1

## 2020-04-02 MED ORDER — OXYMETAZOLINE HCL 0.05 % NA SOLN
1.0000 | Freq: Once | NASAL | Status: AC
  Administered 2020-04-02: 1 via NASAL

## 2020-04-02 MED ORDER — OXYCODONE HCL 5 MG PO TABS
5.0000 mg | ORAL_TABLET | ORAL | Status: DC | PRN
  Administered 2020-04-02: 5 mg via ORAL
  Filled 2020-04-02: qty 1
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
## 2020-04-02 NOTE — ED Triage Notes (Signed)
Patient reports fever for 4 days. Patient reports she has been having intermittent nosebleeds for several days. Patient reports she had AKA 01/11/20, incision is dry/no redness noted. Patient reports intermittent nausea and vomiting today. Patient A&Ox3.

## 2020-04-02 NOTE — ED Notes (Signed)
US at bedside

## 2020-04-04 ENCOUNTER — Ambulatory Visit: Payer: Medicaid Other | Attending: Nurse Practitioner

## 2020-04-04 ENCOUNTER — Other Ambulatory Visit: Payer: Self-pay

## 2020-04-04 ENCOUNTER — Encounter: Payer: Self-pay | Admitting: Physical Therapy

## 2020-04-04 DIAGNOSIS — Z89611 Acquired absence of right leg above knee: Secondary | ICD-10-CM | POA: Diagnosis not present

## 2020-04-04 DIAGNOSIS — R269 Unspecified abnormalities of gait and mobility: Secondary | ICD-10-CM | POA: Diagnosis not present

## 2020-04-04 DIAGNOSIS — M6281 Muscle weakness (generalized): Secondary | ICD-10-CM | POA: Diagnosis not present
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
## 2020-04-04 NOTE — Patient Instructions (Signed)
Access Code: [email or web address] URL: [email or web address] Date: 04/04/2020 Prepared by: Lieutenant Diego Exercises Sidelying Hip Abduction (AKA) - 1 x daily - 7 x weekly - 3 sets - 10 reps Prone Hip Extension with Residual Limb (AKA) - 1 x daily - 7 x weekly - 3 sets - 10 reps Heel rises with counter support - 1 x daily - 7 x weekly - 3 sets - 10 reps Wide Stance with Head Nods and Counter Support - 1 x daily - 7 x weekly - 3 sets - 10 reps Standing with Head Rotation - 1 x daily - 7 x weekly - 3 sets - 10 reps

## 2020-04-05 DIAGNOSIS — Z79899 Other long term (current) drug therapy: Secondary | ICD-10-CM | POA: Diagnosis not present

## 2020-04-08 ENCOUNTER — Ambulatory Visit: Payer: Medicaid Other | Admitting: Physical Therapy

## 2020-04-09 ENCOUNTER — Other Ambulatory Visit: Payer: Self-pay

## 2020-04-09 ENCOUNTER — Ambulatory Visit (INDEPENDENT_AMBULATORY_CARE_PROVIDER_SITE_OTHER): Payer: Medicaid Other | Admitting: Nurse Practitioner

## 2020-04-09 ENCOUNTER — Encounter (INDEPENDENT_AMBULATORY_CARE_PROVIDER_SITE_OTHER): Payer: Self-pay | Admitting: Nurse Practitioner

## 2020-04-09 VITALS — BP 118/71 | HR 71 | Resp 16

## 2020-04-09 DIAGNOSIS — Z89611 Acquired absence of right leg above knee: Secondary | ICD-10-CM

## 2020-04-09 DIAGNOSIS — I10 Essential (primary) hypertension: Secondary | ICD-10-CM | POA: Diagnosis not present

## 2020-04-10 ENCOUNTER — Ambulatory Visit: Payer: Self-pay | Admitting: Licensed Clinical Social Worker

## 2020-04-10 ENCOUNTER — Ambulatory Visit: Payer: Medicaid Other | Attending: Nurse Practitioner

## 2020-04-10 ENCOUNTER — Encounter: Payer: Self-pay | Admitting: Physical Therapy

## 2020-04-10 DIAGNOSIS — M6281 Muscle weakness (generalized): Secondary | ICD-10-CM | POA: Diagnosis not present

## 2020-04-10 DIAGNOSIS — R269 Unspecified abnormalities of gait and mobility: Secondary | ICD-10-CM | POA: Insufficient documentation

## 2020-04-10 DIAGNOSIS — Z89611 Acquired absence of right leg above knee: Secondary | ICD-10-CM | POA: Diagnosis not present
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
## 2020-04-14 ENCOUNTER — Encounter (INDEPENDENT_AMBULATORY_CARE_PROVIDER_SITE_OTHER): Payer: Self-pay | Admitting: Nurse Practitioner
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
## 2020-04-15 ENCOUNTER — Ambulatory Visit: Payer: Medicaid Other | Admitting: Physical Therapy

## 2020-04-17 ENCOUNTER — Other Ambulatory Visit: Payer: Self-pay | Admitting: Family Medicine

## 2020-04-17 ENCOUNTER — Ambulatory Visit: Payer: Medicaid Other | Admitting: Physical Therapy

## 2020-04-22 ENCOUNTER — Other Ambulatory Visit: Payer: Self-pay

## 2020-04-22 ENCOUNTER — Ambulatory Visit: Payer: Medicaid Other | Admitting: Physical Therapy

## 2020-04-22 NOTE — Telephone Encounter (Signed)
Medication refill for Metoprolol Succ ER 50 mg tab, qty 30 last ov 12/27/19, upcoming ov 05/13/20 .

## 2020-04-23 MED ORDER — METOPROLOL SUCCINATE ER 50 MG PO TB24
50.0000 mg | ORAL_TABLET | Freq: Every day | ORAL | 0 refills | Status: DC
Start: 2020-04-23 — End: 2020-05-13

## 2020-04-24 ENCOUNTER — Encounter: Payer: Self-pay | Admitting: Physical Therapy

## 2020-04-24 ENCOUNTER — Ambulatory Visit: Payer: Medicaid Other | Admitting: Physical Therapy

## 2020-04-24 ENCOUNTER — Other Ambulatory Visit: Payer: Self-pay

## 2020-04-24 DIAGNOSIS — R269 Unspecified abnormalities of gait and mobility: Secondary | ICD-10-CM

## 2020-04-24 DIAGNOSIS — M6281 Muscle weakness (generalized): Secondary | ICD-10-CM | POA: Diagnosis not present

## 2020-04-24 DIAGNOSIS — Z89611 Acquired absence of right leg above knee: Secondary | ICD-10-CM
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
## 2020-04-29 ENCOUNTER — Ambulatory Visit: Payer: Medicaid Other | Admitting: Physical Therapy

## 2020-05-01 ENCOUNTER — Other Ambulatory Visit: Payer: Self-pay

## 2020-05-01 ENCOUNTER — Ambulatory Visit: Payer: Medicaid Other

## 2020-05-01 DIAGNOSIS — M6281 Muscle weakness (generalized): Secondary | ICD-10-CM

## 2020-05-01 DIAGNOSIS — Z89611 Acquired absence of right leg above knee: Secondary | ICD-10-CM | POA: Diagnosis not present

## 2020-05-01 DIAGNOSIS — R269 Unspecified abnormalities of gait and mobility: Secondary | ICD-10-CM
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
## 2020-05-06 ENCOUNTER — Ambulatory Visit: Payer: Medicaid Other | Admitting: Physical Therapy

## 2020-05-06 DIAGNOSIS — Z79899 Other long term (current) drug therapy: Secondary | ICD-10-CM | POA: Diagnosis not present

## 2020-05-08 ENCOUNTER — Ambulatory Visit: Payer: Medicaid Other | Attending: Nurse Practitioner

## 2020-05-08 ENCOUNTER — Other Ambulatory Visit: Payer: Self-pay

## 2020-05-08 DIAGNOSIS — M6281 Muscle weakness (generalized): Secondary | ICD-10-CM | POA: Insufficient documentation

## 2020-05-08 DIAGNOSIS — R269 Unspecified abnormalities of gait and mobility: Secondary | ICD-10-CM | POA: Diagnosis present
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
## 2020-05-10 ENCOUNTER — Ambulatory Visit: Payer: Medicaid Other | Admitting: Licensed Clinical Social Worker
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
## 2020-05-13 ENCOUNTER — Encounter: Payer: Self-pay | Admitting: Family Medicine

## 2020-05-13 ENCOUNTER — Ambulatory Visit (INDEPENDENT_AMBULATORY_CARE_PROVIDER_SITE_OTHER): Payer: Medicaid Other | Admitting: Family Medicine

## 2020-05-13 ENCOUNTER — Other Ambulatory Visit: Payer: Self-pay

## 2020-05-13 ENCOUNTER — Ambulatory Visit: Payer: Medicaid Other | Admitting: Family Medicine

## 2020-05-13 ENCOUNTER — Ambulatory Visit: Payer: Medicaid Other

## 2020-05-13 VITALS — BP 92/57 | HR 82 | Temp 98.5°F | Ht 62.0 in | Wt 128.0 lb

## 2020-05-13 DIAGNOSIS — F418 Other specified anxiety disorders: Secondary | ICD-10-CM | POA: Diagnosis not present

## 2020-05-13 DIAGNOSIS — Z13220 Encounter for screening for lipoid disorders: Secondary | ICD-10-CM

## 2020-05-13 DIAGNOSIS — R269 Unspecified abnormalities of gait and mobility: Secondary | ICD-10-CM | POA: Diagnosis not present

## 2020-05-13 DIAGNOSIS — R8281 Pyuria: Secondary | ICD-10-CM

## 2020-05-13 DIAGNOSIS — D61818 Other pancytopenia: Secondary | ICD-10-CM | POA: Diagnosis not present

## 2020-05-13 DIAGNOSIS — K7460 Unspecified cirrhosis of liver: Secondary | ICD-10-CM

## 2020-05-13 DIAGNOSIS — S78119A Complete traumatic amputation at level between unspecified hip and knee, initial encounter: Secondary | ICD-10-CM | POA: Diagnosis not present

## 2020-05-13 DIAGNOSIS — I10 Essential (primary) hypertension: Secondary | ICD-10-CM

## 2020-05-13 DIAGNOSIS — J449 Chronic obstructive pulmonary disease, unspecified: Secondary | ICD-10-CM | POA: Diagnosis not present

## 2020-05-13 DIAGNOSIS — F5101 Primary insomnia: Secondary | ICD-10-CM

## 2020-05-13 DIAGNOSIS — M6281 Muscle weakness (generalized): Secondary | ICD-10-CM

## 2020-05-13 LAB — URINALYSIS, ROUTINE W REFLEX MICROSCOPIC
Bilirubin, UA: NEGATIVE
Glucose, UA: NEGATIVE
Nitrite, UA: NEGATIVE
Protein,UA: NEGATIVE
RBC, UA: NEGATIVE
Specific Gravity, UA: 1.02 (ref 1.005–1.030)
Urobilinogen, Ur: 4 mg/dL — ABNORMAL HIGH (ref 0.2–1.0)
pH, UA: 6.5 (ref 5.0–7.5)

## 2020-05-13 LAB — MICROALBUMIN, URINE WAIVED
Creatinine, Urine Waived: 200 mg/dL (ref 10–300)
Microalb, Ur Waived: 30 mg/L — ABNORMAL HIGH (ref 0–19)
Microalb/Creat Ratio: <30 mg/g (ref <30)

## 2020-05-13 LAB — MICROSCOPIC EXAMINATION: RBC, Urine: NONE SEEN /hpf (ref 0–2)

## 2020-05-13 MED ORDER — DICYCLOMINE HCL 20 MG PO TABS
20.0000 mg | ORAL_TABLET | Freq: Three times a day (TID) | ORAL | 0 refills | Status: DC
Start: 2020-05-13 — End: 2020-06-20

## 2020-05-13 MED ORDER — METOPROLOL SUCCINATE ER 50 MG PO TB24
50.0000 mg | ORAL_TABLET | Freq: Every day | ORAL | 0 refills | Status: DC
Start: 2020-05-13 — End: 2020-06-27

## 2020-05-13 MED ORDER — MIDODRINE HCL 5 MG PO TABS
5.0000 mg | ORAL_TABLET | Freq: Two times a day (BID) | ORAL | 0 refills | Status: DC
Start: 2020-05-13 — End: 2020-08-26

## 2020-05-13 MED ORDER — GABAPENTIN 400 MG PO CAPS
400.0000 mg | ORAL_CAPSULE | Freq: Three times a day (TID) | ORAL | 0 refills | Status: DC | PRN
Start: 2020-05-13 — End: 2020-06-13

## 2020-05-13 MED ORDER — ZOLPIDEM TARTRATE 5 MG PO TABS: 5.0000 mg | ORAL_TABLET | Freq: Every evening | ORAL | 2 refills | Status: DC | PRN

## 2020-05-13 MED ORDER — MOMETASONE FURO-FORMOTEROL FUM 100-5 MCG/ACT IN AERO: 2.0000 | INHALATION_SPRAY | Freq: Two times a day (BID) | RESPIRATORY_TRACT | 3 refills | Status: DC

## 2020-05-13 MED ORDER — HYDROXYZINE HCL 25 MG PO TABS
25.0000 mg | ORAL_TABLET | Freq: Three times a day (TID) | ORAL | 0 refills | Status: DC | PRN
Start: 2020-05-13 — End: 2020-06-13
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
## 2020-05-13 NOTE — Progress Notes (Signed)
BP (!) 92/57   Pulse 82   Temp 98.5 F (36.9 C) (Oral)   Ht 5\' 2"  (1.575 m)   Wt 128 lb (58.1 kg)   BMI 23.41 kg/m    Subjective:    [name], female    DOB: 1976/04/14, 44 y.o.   MRN: [id number]  HPI: MELANNI Martin is a 44 y.o. female  Chief Complaint  Patient presents with  . Hypertension   Since last visit, has had an AKA of the R leg. Has been doing much better. Feeling so much better. She notes that she is doing much better. Going for 1st prosthesis fitting tomorrow.   Stopped taking the cymbalta. It was helping her with her anxiety, but was having hallucinations on it. Those have stopped since then. Visual and auditory hallucinations. seroquel makes her gain weight, prozac- didn't work, clonidine- didn't work, Market researcher- hallucinations, zyprexa- didn't help.   HYPERTENSION Hypertension status: stable  Satisfied with current treatment? yes Duration of hypertension: chronic BP monitoring frequency:  rarely BP medication side effects:  no Medication compliance: good compliance Previous BP meds:metoprolol Aspirin: no Recurrent headaches: no Visual changes: no Palpitations: no Dyspnea: no Chest pain: no Lower extremity edema: no Dizzy/lightheaded: no   Relevant past medical, surgical, family and social history reviewed and updated as indicated. Interim medical history since our last visit reviewed. Allergies and medications reviewed and updated.  Review of Systems  Constitutional: Negative.   Respiratory: Negative.   Cardiovascular: Negative.   Gastrointestinal: Negative.   Musculoskeletal: Negative.   Skin: Negative.   Psychiatric/Behavioral: Positive for dysphoric mood, hallucinations and sleep disturbance. Negative for agitation, behavioral problems, confusion, decreased concentration, self-injury and suicidal ideas. The patient is nervous/anxious. The patient is not hyperactive.     Per HPI unless specifically indicated above      Objective:    BP (!) 92/57   Pulse 82   Temp 98.5 F (36.9 C) (Oral)   Ht 5\' 2"  (1.575 m)   Wt 128 lb (58.1 kg)   BMI 23.41 kg/m   Wt Readings from Last 3 Encounters:  05/13/20 128 lb (58.1 kg)  04/02/20 134 lb 7.7 oz (61 kg)  03/19/20 135 lb (61.2 kg)    Physical Exam Vitals and nursing note reviewed.  Constitutional:      General: She is not in acute distress.    Appearance: Normal appearance. She is not ill-appearing, toxic-appearing or diaphoretic.     Comments: Wheelchair bound, R AKA   HENT:     Head: Normocephalic and atraumatic.     Right Ear: External ear normal.     Left Ear: External ear normal.     Nose: Nose normal.     Mouth/Throat:     Mouth: Mucous membranes are moist.     Pharynx: Oropharynx is clear.  Eyes:     General: No scleral icterus.       Right eye: No discharge.        Left eye: No discharge.     Extraocular Movements: Extraocular movements intact.     Conjunctiva/sclera: Conjunctivae normal.     Pupils: Pupils are equal, round, and reactive to light.  Cardiovascular:     Rate and Rhythm: Normal rate and regular rhythm.     Pulses: Normal pulses.     Heart sounds: Normal heart sounds. No murmur heard. No friction rub. No gallop.   Pulmonary:     Effort: Pulmonary effort is normal. No respiratory distress.  Breath sounds: Normal breath sounds. No stridor. No wheezing, rhonchi or rales.  Chest:     Chest wall: No tenderness.  Musculoskeletal:        General: Normal range of motion.     Cervical back: Normal range of motion and neck supple.  Skin:    General: Skin is warm and dry.     Capillary Refill: Capillary refill takes less than 2 seconds.     Coloration: Skin is not jaundiced or pale.     Findings: No bruising, erythema, lesion or rash.  Neurological:     General: No focal deficit present.     Mental Status: She is alert and oriented to person, place, and time. Mental status is at baseline.  Psychiatric:        Mood and  Affect: Mood normal.        Behavior: Behavior normal.        Thought Content: Thought content normal.        Judgment: Judgment normal.     Results for orders placed or performed in visit on 05/13/20  Urine Culture   Specimen: Urine   UR  Result Value Ref Range   Urine Culture, Routine Final report    Organism ID, Bacteria Comment   Microscopic Examination   Urine  Result Value Ref Range   WBC, UA 6-10 (A) 0 - 5 /hpf   RBC None seen 0 - 2 /hpf   Epithelial Cells (non renal) 0-10 0 - 10 /hpf   Bacteria, UA Moderate (A) None seen/Few  CBC with Differential/Platelet  Result Value Ref Range   WBC 4.7 3.4 - 10.8 x10E3/uL   RBC 4.64 3.77 - 5.28 x10E6/uL   Hemoglobin 12.2 11.1 - 15.9 g/dL   Hematocrit 36.9 34.0 - 46.6 %   MCV 80 79 - 97 fL   MCH 26.3 (L) 26.6 - 33.0 pg   MCHC 33.1 31.5 - 35.7 g/dL   RDW 19.4 (H) 11.7 - 15.4 %   Platelets 95 (LL) 150 - 450 x10E3/uL   Neutrophils 49 Not Estab. %   Lymphs 31 Not Estab. %   Monocytes 15 Not Estab. %   Eos 4 Not Estab. %   Basos 1 Not Estab. %   Neutrophils Absolute 2.3 1.4 - 7.0 x10E3/uL   Lymphocytes Absolute 1.5 0.7 - 3.1 x10E3/uL   Monocytes Absolute 0.7 0.1 - 0.9 x10E3/uL   EOS (ABSOLUTE) 0.2 0.0 - 0.4 x10E3/uL   Basophils Absolute 0.1 0.0 - 0.2 x10E3/uL   Immature Granulocytes 0 Not Estab. %   Immature Grans (Abs) 0.0 0.0 - 0.1 x10E3/uL   Hematology Comments: Note:   Comprehensive metabolic panel  Result Value Ref Range   Glucose 108 (H) 65 - 99 mg/dL   BUN 10 6 - 24 mg/dL   Creatinine, Ser 0.62 0.57 - 1.00 mg/dL   GFR calc non Af Amer 110 >59 mL/min/1.73   GFR calc Af Amer 127 >59 mL/min/1.73   BUN/Creatinine Ratio 16 9 - 23   Sodium 139 134 - 144 mmol/L   Potassium 4.7 3.5 - 5.2 mmol/L   Chloride 104 96 - 106 mmol/L   CO2 21 20 - 29 mmol/L   Calcium 8.9 8.7 - 10.2 mg/dL   Total Protein 7.2 6.0 - 8.5 g/dL   Albumin 3.7 (L) 3.8 - 4.8 g/dL   Globulin, Total 3.5 1.5 - 4.5 g/dL   Albumin/Globulin Ratio 1.1 (L) 1.2  - 2.2   Bilirubin Total 1.0 0.0 -  1.2 mg/dL   Alkaline Phosphatase 116 44 - 121 IU/L   AST 47 (H) 0 - 40 IU/L   ALT 31 0 - 32 IU/L  Lipid Panel w/o Chol/HDL Ratio  Result Value Ref Range   Cholesterol, Total 146 100 - 199 mg/dL   Triglycerides 49 0 - 149 mg/dL   HDL 55 >39 mg/dL   VLDL Cholesterol Cal 11 5 - 40 mg/dL   LDL Chol Calc (NIH) 80 0 - 99 mg/dL  Microalbumin, Urine Waived  Result Value Ref Range   Microalb, Ur Waived 30 (H) 0 - 19 mg/L   Creatinine, Urine Waived 200 10 - 300 mg/dL   Microalb/Creat Ratio <30 <30 mg/g  TSH  Result Value Ref Range   TSH 1.090 0.450 - 4.500 uIU/mL  Urinalysis, Routine w reflex microscopic  Result Value Ref Range   Specific Gravity, UA 1.020 1.005 - 1.030   pH, UA 6.5 5.0 - 7.5   Color, UA Yellow Yellow   Appearance Ur Clear Clear   Leukocytes,UA 1+ (A) Negative   Protein,UA Negative Negative/Trace   Glucose, UA Negative Negative   Ketones, UA Trace (A) Negative   RBC, UA Negative Negative   Bilirubin, UA Negative Negative   Urobilinogen, Ur 4.0 (H) 0.2 - 1.0 mg/dL   Nitrite, UA Negative Negative   Microscopic Examination See below:       Assessment & Plan:   Problem List Items Addressed This Visit      Cardiovascular and Mediastinum   HTN (hypertension)    Under good control on current regimen. Continue current regimen. Continue to monitor. Call with any concerns. Refills given. Labs drawn today.        Relevant Medications   metoprolol succinate (TOPROL-XL) 50 MG 24 hr tablet   midodrine (PROAMATINE) 5 MG tablet   Other Relevant Orders   CBC with Differential/Platelet (Completed)   Comprehensive metabolic panel (Completed)   Microalbumin, Urine Waived (Completed)   TSH (Completed)   Urinalysis, Routine w reflex microscopic (Completed)     Respiratory   COPD (chronic obstructive pulmonary disease) ([facility])    Under good control on current regimen. Continue current regimen. Continue to monitor. Call with any concerns.  Refills given. Labs drawn today.        Relevant Medications   mometasone-formoterol (DULERA) 100-5 MCG/ACT AERO   Other Relevant Orders   CBC with Differential/Platelet (Completed)   Comprehensive metabolic panel (Completed)     Digestive   Liver cirrhosis ([place])    Encouraged follow up with GI. Checking labs today. Await result. Call with any concerns.      Relevant Orders   CBC with Differential/Platelet (Completed)   Comprehensive metabolic panel (Completed)     Hematopoietic and Hemostatic   Pancytopenia ([place])    Rechecking labs today. Await results. Treat as needed.       Relevant Orders   CBC with Differential/Platelet (Completed)   Comprehensive metabolic panel (Completed)   Urinalysis, Routine w reflex microscopic (Completed)     Other   Anxious depression - Primary    Discussed given the severity of her reactions to numerous medications that she see psychiatry. Referral generated today.       Relevant Medications   hydrOXYzine (ATARAX/VISTARIL) 25 MG tablet   Other Relevant Orders   Ambulatory referral to Psychiatry   Insomnia    Under good control on current regimen. Continue current regimen. Continue to monitor. Call with any concerns. Refills given for 3 months. Follow up  3 months.        Amputation above knee [facility])    Doing much better. Continue to monitor. Call with any concerns.        Other Visit Diagnoses    Screening for cholesterol level       Relevant Orders   Lipid Panel w/o Chol/HDL Ratio (Completed)   Pyuria       Will check urine culture. Await results.    Relevant Orders   Urine Culture (Completed)       Follow up plan: Return 2-3 months, for physical with Dr. Neomia Dear (Cannot have follow up and physical on same day).

## 2020-05-14 LAB — COMPREHENSIVE METABOLIC PANEL
ALT: 31 IU/L (ref 0–32)
AST: 47 IU/L — ABNORMAL HIGH (ref 0–40)
Albumin/Globulin Ratio: 1.1 — ABNORMAL LOW (ref 1.2–2.2)
Albumin: 3.7 g/dL — ABNORMAL LOW (ref 3.8–4.8)
Alkaline Phosphatase: 116 IU/L (ref 44–121)
BUN/Creatinine Ratio: 16 (ref 9–23)
BUN: 10 mg/dL (ref 6–24)
Bilirubin Total: 1 mg/dL (ref 0.0–1.2)
CO2: 21 mmol/L (ref 20–29)
Calcium: 8.9 mg/dL (ref 8.7–10.2)
Chloride: 104 mmol/L (ref 96–106)
Creatinine, Ser: 0.62 mg/dL (ref 0.57–1.00)
GFR calc Af Amer: 127 mL/min/{1.73_m2} (ref >59)
GFR calc non Af Amer: 110 mL/min/{1.73_m2} (ref >59)
Globulin, Total: 3.5 g/dL (ref 1.5–4.5)
Glucose: 108 mg/dL — ABNORMAL HIGH (ref 65–99)
Potassium: 4.7 mmol/L (ref 3.5–5.2)
Sodium: 139 mmol/L (ref 134–144)
Total Protein: 7.2 g/dL (ref 6.0–8.5)

## 2020-05-14 LAB — CBC WITH DIFFERENTIAL/PLATELET
Basophils Absolute: 0.1 10*3/uL (ref 0.0–0.2)
Basos: 1 %
EOS (ABSOLUTE): 0.2 10*3/uL (ref 0.0–0.4)
Eos: 4 %
Hematocrit: 36.9 % (ref 34.0–46.6)
Hemoglobin: 12.2 g/dL (ref 11.1–15.9)
Immature Grans (Abs): 0 10*3/uL (ref 0.0–0.1)
Immature Granulocytes: 0 %
Lymphocytes Absolute: 1.5 10*3/uL (ref 0.7–3.1)
Lymphs: 31 %
MCH: 26.3 pg — ABNORMAL LOW (ref 26.6–33.0)
MCHC: 33.1 g/dL (ref 31.5–35.7)
MCV: 80 fL (ref 79–97)
Monocytes Absolute: 0.7 10*3/uL (ref 0.1–0.9)
Monocytes: 15 %
Neutrophils Absolute: 2.3 10*3/uL (ref 1.4–7.0)
Neutrophils: 49 %
Platelets: 95 10*3/uL — CL (ref 150–450)
RBC: 4.64 x10E6/uL (ref 3.77–5.28)
RDW: 19.4 % — ABNORMAL HIGH (ref 11.7–15.4)
WBC: 4.7 10*3/uL (ref 3.4–10.8)

## 2020-05-14 LAB — LIPID PANEL W/O CHOL/HDL RATIO
Cholesterol, Total: 146 mg/dL (ref 100–199)
HDL: 55 mg/dL (ref >39)
LDL Chol Calc (NIH): 80 mg/dL (ref 0–99)
Triglycerides: 49 mg/dL (ref 0–149)
VLDL Cholesterol Cal: 11 mg/dL (ref 5–40)

## 2020-05-14 LAB — TSH: TSH: 1.09 u[IU]/mL (ref 0.450–4.500)

## 2020-05-15 ENCOUNTER — Other Ambulatory Visit: Payer: Self-pay

## 2020-05-15 ENCOUNTER — Ambulatory Visit: Payer: Medicaid Other

## 2020-05-15 DIAGNOSIS — R269 Unspecified abnormalities of gait and mobility: Secondary | ICD-10-CM

## 2020-05-15 DIAGNOSIS — M6281 Muscle weakness (generalized): Secondary | ICD-10-CM

## 2020-05-15 LAB — URINE CULTURE
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
## 2020-05-16 NOTE — Addendum Note (Signed)
Addended by: Roxana Hires D on: 05/16/2020 10:07 AM   Modules accepted: Orders

## 2020-05-20 ENCOUNTER — Ambulatory Visit: Payer: Medicaid Other

## 2020-05-22 ENCOUNTER — Ambulatory Visit: Payer: Self-pay | Admitting: *Deleted

## 2020-05-22 NOTE — Telephone Encounter (Signed)
Calls with low grade fever, sore throat, cough, SOB and fatigue. Noted SOB during our conversation. Using rescue inhaler as directed. Hx of COPD. Initially Scheduled Covid test-disposition is UC/ED. Advised UC at this time. Husband will drive her.    Reason for Disposition  MODERATE difficulty breathing (e.g., speaks in phrases, SOB even at rest, pulse 100-120)  Answer Assessment - Initial Assessment Questions 1. COVID-19 DIAGNOSIS: "Who made your Coronavirus (COVID-19) diagnosis?" "Was it confirmed by a positive lab test?" If not diagnosed by a HCP, ask "Are there lots of cases (community spread) where you live?" (See public health department website, if unsure)     Called with ovid-like symptoms for 2 days 2. COVID-19 EXPOSURE: "Was there any known exposure to COVID before the symptoms began?" CDC Definition of close contact: within 6 feet (2 meters) for a total of 15 minutes or more over a 24-hour period.      None known 3. ONSET: "When did the COVID-19 symptoms start?"      2 days ago 4. WORST SYMPTOM: "What is your worst symptom?" (e.g., cough, fever, shortness of breath, muscle aches)     Cough and SOB 5. COUGH: "Do you have a cough?" If Yes, ask: "How bad is the cough?"       Yes, hurts chest  6. FEVER: "Do you have a fever?" If Yes, ask: "What is your temperature, how was it measured, and when did it start?"     Yes, 100.7 this morning 7. RESPIRATORY STATUS: "Describe your breathing?" (e.g., shortness of breath, wheezing, unable to speak)      Not getting enough air in 8. BETTER-SAME-WORSE: "Are you getting better, staying the same or getting worse compared to yesterday?"  If getting worse, ask, "In what way?"     worse 9. HIGH RISK DISEASE: "Do you have any chronic medical problems?" (e.g., asthma, heart or lung disease, weak immune system, obesity, etc.)    COPD 10. PREGNANCY: "Is there any chance you are pregnant?" "When was your last menstrual period?"       no 11. OTHER  SYMPTOMS: "Do you have any other symptoms?"  (e.g., chills, fatigue, headache, loss of smell or taste, muscle pain, sore throat; new loss of smell or taste especially support the diagnosis of COVID-19)       fatigue  Protocols used: CORONAVIRUS (COVID-19) DIAGNOSED OR SUSPECTED-A-AH

## 2020-05-22 NOTE — Telephone Encounter (Signed)
Agree with plan of care, especially since SOB present on phone.  Will plan for follow-up with patient after UC or ER visit.

## 2020-05-23 ENCOUNTER — Ambulatory Visit: Payer: Medicaid Other

## 2020-05-24 ENCOUNTER — Other Ambulatory Visit: Payer: Medicaid Other

## 2020-05-24 ENCOUNTER — Other Ambulatory Visit: Payer: Self-pay

## 2020-05-24 DIAGNOSIS — Z20822 Contact with and (suspected) exposure to covid-19: Secondary | ICD-10-CM

## 2020-05-25 LAB — SARS-COV-2, NAA 2 DAY TAT

## 2020-05-25 LAB — NOVEL CORONAVIRUS, NAA: SARS-CoV-2, NAA: DETECTED — AB

## 2020-05-26 ENCOUNTER — Telehealth: Payer: Self-pay | Admitting: Nurse Practitioner

## 2020-05-26 NOTE — Telephone Encounter (Signed)
Called to Discuss with patient about Covid symptoms and the use of the monoclonal antibody infusion for those with mild to moderate Covid symptoms and at a high risk of hospitalization.     Pt appears to qualify for this infusion due to co-morbid conditions and/or a member of an at-risk group in accordance with the FDA Emergency Use Authorization. (Multiple co-morbidities)   Unable to reach pt. Voicemail left and My Chart message sent.   Alda Lea, NP [facility]  [phone]

## 2020-05-27 NOTE — Assessment & Plan Note (Signed)
Rechecking labs today. Await results. Treat as needed.  °

## 2020-05-27 NOTE — Assessment & Plan Note (Signed)
Encouraged follow up with GI. Checking labs today. Await result. Call with any concerns.

## 2020-05-27 NOTE — Assessment & Plan Note (Signed)
Under good control on current regimen. Continue current regimen. Continue to monitor. Call with any concerns. Refills given. Labs drawn today.   

## 2020-05-27 NOTE — Assessment & Plan Note (Signed)
Doing much better. Continue to monitor. Call with any concerns.

## 2020-05-27 NOTE — Assessment & Plan Note (Addendum)
Under good control on current regimen. Continue current regimen. Continue to monitor. Call with any concerns. Refills given. Labs drawn today.   

## 2020-05-27 NOTE — Assessment & Plan Note (Signed)
Under good control on current regimen. Continue current regimen. Continue to monitor. Call with any concerns. Refills given for 3 months. Follow up 3 months.    

## 2020-05-27 NOTE — Assessment & Plan Note (Signed)
Discussed given the severity of her reactions to numerous medications that she see psychiatry. Referral generated today.

## 2020-05-28 ENCOUNTER — Ambulatory Visit: Payer: Medicaid Other

## 2020-05-29 DIAGNOSIS — Z79899 Other long term (current) drug therapy: Secondary | ICD-10-CM | POA: Diagnosis not present

## 2020-05-29 NOTE — Telephone Encounter (Signed)
See mychart.  

## 2020-05-30 ENCOUNTER — Ambulatory Visit: Payer: Medicaid Other

## 2020-06-04 ENCOUNTER — Other Ambulatory Visit: Payer: Self-pay

## 2020-06-04 ENCOUNTER — Ambulatory Visit: Payer: Medicaid Other

## 2020-06-04 DIAGNOSIS — M6281 Muscle weakness (generalized): Secondary | ICD-10-CM

## 2020-06-04 DIAGNOSIS — R269 Unspecified abnormalities of gait and mobility: Secondary | ICD-10-CM | POA: Diagnosis not present
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
## 2020-06-06 ENCOUNTER — Ambulatory Visit: Payer: Medicaid Other

## 2020-06-06 ENCOUNTER — Other Ambulatory Visit: Payer: Self-pay

## 2020-06-06 DIAGNOSIS — R269 Unspecified abnormalities of gait and mobility: Secondary | ICD-10-CM

## 2020-06-06 DIAGNOSIS — M6281 Muscle weakness (generalized): Secondary | ICD-10-CM
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
## 2020-06-07 DIAGNOSIS — Z89611 Acquired absence of right leg above knee: Secondary | ICD-10-CM | POA: Diagnosis not present

## 2020-06-10 ENCOUNTER — Ambulatory Visit: Payer: Medicaid Other | Attending: Nurse Practitioner

## 2020-06-10 DIAGNOSIS — M6281 Muscle weakness (generalized): Secondary | ICD-10-CM | POA: Insufficient documentation

## 2020-06-10 DIAGNOSIS — R269 Unspecified abnormalities of gait and mobility: Secondary | ICD-10-CM | POA: Insufficient documentation

## 2020-06-10 DIAGNOSIS — Z89611 Acquired absence of right leg above knee: Secondary | ICD-10-CM | POA: Insufficient documentation

## 2020-06-10 NOTE — Patient Instructions (Incomplete)
Physical Therapy Progress Note   Dates of reporting period  04/04/20   to   06/10/20  TREATMENT   Ther-ex Supine hip flexion, straight knee on the L side, manual resistance on the R side 2 x 10each; Supine L hip straight leg abduction and adduction with manual resistance 2 x 10 both directions; Supine adductor squeeze withballbetween thighs 2-3s hold 2 x 10; Supine R hip ER with manual resistance from therapist 2 x 10; Supine bridgesover foam roller with therapist stabilizing roller 3s hold 2 x 10; Supine L ankle manually resisted DFand PF2 x 10 each direction L sidelying R hip abduction with manual resistance 2 x 10; L sidelying R hip extension with manual resistance 2 x 10; RSidelyingLhipabductionmanual resistanceat knee 2 x 10; RSidelyingLhip ER (clam) manual resistanceat knee 2 x 10; RSidelyingLhipreverseclam2 x 10; Proneleft hip extension with both bent and straight knee2x10 each  Prone left hamstring curls with manual resistance from therapist with ankle 2x10; Prone passive R hip flexor stretch with pillow and towel under residual R limb x 3 minutes;   Gait Training Gait in // bars with pt performing hop-to gait with BUE support on bars x 6 lengths;   Pt educated throughout session about proper posture and technique with exercises. Improved exercise technique, movement at target joints, use of target muscles after min to mod verbal, visual, tactile cues.   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 Incorporated some gait training into session today.Patient encouraged  to continue HEP and follow-up as scheduled.Pt will benefit from PT services to address deficits in strengthandmobility in order to return to full function at home eventually with use of prosthetic.

## 2020-06-11 NOTE — Patient Instructions (Incomplete)
Physical Therapy Progress Note   Dates of reporting period  04/04/20   to   06/12/20 (update outcome measures and goals)  TREATMENT   Ther-ex Supine hip flexion, straight knee on the L side, manual resistance on the R side 2 x 10each; Supine L hip straight leg abduction and adduction with manual resistance 2 x 10 both directions; Supine adductor squeeze withballbetween thighs 2-3s hold 2 x 10; Supine R hip ER with manual resistance from therapist 2 x 10; Supine bridgesover foam roller with therapist stabilizing roller 3s hold 2 x 10; Supine L ankle manually resisted DFand PF2 x 10 each direction L sidelying R hip abduction with manual resistance 2 x 10; L sidelying R hip extension with manual resistance 2 x 10; RSidelyingLhipabductionmanual resistanceat knee 2 x 10; RSidelyingLhip ER (clam) manual resistanceat knee 2 x 10; RSidelyingLhipreverseclam2 x 10; Proneleft hip extension with both bent and straight knee2x10 each  Prone left hamstring curls with manual resistance from therapist with ankle 2x10; Prone passive R hip flexor stretch with pillow and towel under residual R limb x 3 minutes;   Gait Training Gait in // bars with pt performing hop-to gait with BUE support on bars x 6 lengths;   Pt educated throughout session about proper posture and technique with exercises. Improved exercise technique, movement at target joints, use of target muscles after min to mod verbal, visual, tactile cues.   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 Incorporated some gait training  into session today.Patient encouraged to continue HEP and follow-up as scheduled.Pt will benefit from PT services to address deficits in strengthandmobility in order to return to full function at home eventually with use of prosthetic.

## 2020-06-12 ENCOUNTER — Ambulatory Visit: Payer: Medicaid Other

## 2020-06-12 DIAGNOSIS — M6281 Muscle weakness (generalized): Secondary | ICD-10-CM

## 2020-06-12 DIAGNOSIS — R269 Unspecified abnormalities of gait and mobility: Secondary | ICD-10-CM

## 2020-06-13 ENCOUNTER — Other Ambulatory Visit: Payer: Self-pay | Admitting: Family Medicine

## 2020-06-17 ENCOUNTER — Ambulatory Visit: Payer: Medicaid Other

## 2020-06-18 NOTE — Patient Instructions (Incomplete)
Physical Therapy Progress Note   Dates of reporting period  ***   to   06/19/20   TREATMENT  Need to update goals  Ther-ex Supine hip flexion, straight knee on the L side, manual resistance on the R side 2 x 10each; Supine L hip straight leg abduction and adduction with manual resistance 2 x 10 both directions; Supine adductor squeeze withballbetween thighs 2-3s hold 2 x 10; Supine R hip ER with manual resistance from therapist 2 x 10; Supine bridgesover foam roller with therapist stabilizing roller 3s hold 2 x 10; Supine L ankle manually resisted DFand PF2 x 10 each direction L sidelying R hip abduction with manual resistance 2 x 10; L sidelying R hip extension with manual resistance 2 x 10; RSidelyingLhipabductionmanual resistanceat knee 2 x 10; RSidelyingLhip ER (clam) manual resistanceat knee 2 x 10; RSidelyingLhipreverseclam2 x 10; Proneleft hip extension with both bent and straight knee2x10 each  Prone left hamstring curls with manual resistance from therapist with ankle 2x10; Prone passive R hip flexor stretch with pillow and towel under residual R limb x 3 minutes;   Gait Training Gait in // bars with pt performing hop-to gait with BUE support on bars x 6 lengths;   Pt educated throughout session about proper posture and technique with exercises. Improved exercise technique, movement at target joints, use of target muscles after min to mod verbal, visual, tactile cues.   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 Incorporated some gait training into session  today.Patient encouraged to continue HEP and follow-up as scheduled.Pt will benefit from PT services to address deficits in strengthandmobility in order to return to full function at home eventually with use of prosthetic.

## 2020-06-19 ENCOUNTER — Ambulatory Visit: Payer: Medicaid Other

## 2020-06-19 DIAGNOSIS — M6281 Muscle weakness (generalized): Secondary | ICD-10-CM

## 2020-06-19 DIAGNOSIS — R269 Unspecified abnormalities of gait and mobility: Secondary | ICD-10-CM

## 2020-06-20 ENCOUNTER — Other Ambulatory Visit: Payer: Self-pay | Admitting: Family Medicine

## 2020-06-24 ENCOUNTER — Ambulatory Visit: Payer: Medicaid Other

## 2020-06-26 ENCOUNTER — Other Ambulatory Visit: Payer: Self-pay

## 2020-06-26 ENCOUNTER — Ambulatory Visit: Payer: Medicaid Other

## 2020-06-26 DIAGNOSIS — R269 Unspecified abnormalities of gait and mobility: Secondary | ICD-10-CM

## 2020-06-26 DIAGNOSIS — M6281 Muscle weakness (generalized): Secondary | ICD-10-CM

## 2020-06-26 DIAGNOSIS — Z89611 Acquired absence of right leg above knee: Secondary | ICD-10-CM | POA: Diagnosis not present
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
## 2020-06-27 ENCOUNTER — Other Ambulatory Visit: Payer: Self-pay | Admitting: Family Medicine

## 2020-06-27 ENCOUNTER — Encounter: Payer: Self-pay | Admitting: Family Medicine

## 2020-07-01 ENCOUNTER — Ambulatory Visit: Payer: Medicaid Other

## 2020-07-01 ENCOUNTER — Other Ambulatory Visit: Payer: Self-pay

## 2020-07-01 VITALS — BP 115/60 | HR 80

## 2020-07-01 DIAGNOSIS — M6281 Muscle weakness (generalized): Secondary | ICD-10-CM

## 2020-07-01 DIAGNOSIS — R269 Unspecified abnormalities of gait and mobility: Secondary | ICD-10-CM | POA: Diagnosis not present

## 2020-07-01 DIAGNOSIS — Z89611 Acquired absence of right leg above knee: Secondary | ICD-10-CM | POA: Diagnosis not present
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
## 2020-07-03 ENCOUNTER — Ambulatory Visit: Payer: Medicaid Other

## 2020-07-04 ENCOUNTER — Encounter: Payer: Self-pay | Admitting: Family Medicine

## 2020-07-04 ENCOUNTER — Ambulatory Visit (INDEPENDENT_AMBULATORY_CARE_PROVIDER_SITE_OTHER): Payer: Medicaid Other | Admitting: Family Medicine

## 2020-07-04 ENCOUNTER — Other Ambulatory Visit: Payer: Self-pay

## 2020-07-04 VITALS — BP 132/76 | HR 78 | Temp 98.8°F | Wt 137.0 lb

## 2020-07-04 DIAGNOSIS — F418 Other specified anxiety disorders: Secondary | ICD-10-CM

## 2020-07-04 DIAGNOSIS — R002 Palpitations: Secondary | ICD-10-CM

## 2020-07-04 MED ORDER — HYDROXYZINE HCL 25 MG PO TABS
25.0000 mg | ORAL_TABLET | Freq: Three times a day (TID) | ORAL | 3 refills | Status: DC | PRN
Start: 2020-07-04 — End: 2021-03-25

## 2020-07-04 NOTE — Progress Notes (Signed)
BP 132/76   Pulse 78   Temp 98.8 F (37.1 C) (Oral)   Wt 137 lb (62.1 kg)   SpO2 97%   BMI 25.06 kg/m    Subjective:    [name], female    DOB: 1975-07-30, 45 y.o.   MRN: [id number]  HPI: KARIMA CARRELL is a 45 y.o. female  Chief Complaint  Patient presents with  . Increased heart rate    Patient states this is when she is trying to go to bed or at rest   PALPITATIONS Duration: week and a half to 2 weeks Symptom description: pounding out of her chest Duration of episode: hours Frequency: when laying down at night Activity when event occurred: rest Related to exertion: no Dyspnea: yes Chest pain: yes Syncope: no Anxiety/stress: yes Nausea/vomiting: no Diaphoresis: no Coronary artery disease: no Congestive heart failure: no Arrhythmia: no Thyroid disease: no Caffeine intake: 1 cafienated beverage Status:  stable Treatments attempted:none  Relevant past medical, surgical, family and social history reviewed and updated as indicated. Interim medical history since our last visit reviewed. Allergies and medications reviewed and updated.  Review of Systems  Constitutional: Negative.   HENT: Negative.   Respiratory: Negative.   Cardiovascular: Positive for palpitations. Negative for chest pain and leg swelling.  Gastrointestinal: Negative.   Musculoskeletal: Negative.   Skin: Negative.   Neurological: Negative.   Psychiatric/Behavioral: Positive for dysphoric mood and sleep disturbance. Negative for agitation, behavioral problems, confusion, decreased concentration, hallucinations, self-injury and suicidal ideas. The patient is nervous/anxious. The patient is not hyperactive.     Per HPI unless specifically indicated above     Objective:    BP 132/76   Pulse 78   Temp 98.8 F (37.1 C) (Oral)   Wt 137 lb (62.1 kg)   SpO2 97%   BMI 25.06 kg/m   Wt Readings from Last 3 Encounters:  07/04/20 137 lb (62.1 kg)  05/13/20 128 lb (58.1 kg)   04/02/20 134 lb 7.7 oz (61 kg)    Physical Exam Vitals and nursing note reviewed.  Constitutional:      General: She is not in acute distress.    Appearance: Normal appearance. She is not ill-appearing, toxic-appearing or diaphoretic.  HENT:     Head: Normocephalic and atraumatic.     Right Ear: External ear normal.     Left Ear: External ear normal.     Nose: Nose normal.     Mouth/Throat:     Mouth: Mucous membranes are moist.     Pharynx: Oropharynx is clear.  Eyes:     General: No scleral icterus.       Right eye: No discharge.        Left eye: No discharge.     Extraocular Movements: Extraocular movements intact.     Conjunctiva/sclera: Conjunctivae normal.     Pupils: Pupils are equal, round, and reactive to light.  Cardiovascular:     Rate and Rhythm: Normal rate and regular rhythm.     Pulses: Normal pulses.     Heart sounds: Normal heart sounds. No murmur heard. No friction rub. No gallop.   Pulmonary:     Effort: Pulmonary effort is normal. No respiratory distress.     Breath sounds: Normal breath sounds. No stridor. No wheezing, rhonchi or rales.  Chest:     Chest wall: No tenderness.  Musculoskeletal:        General: Normal range of motion.     Cervical  back: Normal range of motion and neck supple.     Comments: Prosthesis on R lower leg  Skin:    General: Skin is warm and dry.     Capillary Refill: Capillary refill takes less than 2 seconds.     Coloration: Skin is not jaundiced or pale.     Findings: No bruising, erythema, lesion or rash.  Neurological:     General: No focal deficit present.     Mental Status: She is alert and oriented to person, place, and time. Mental status is at baseline.  Psychiatric:        Mood and Affect: Mood normal.        Behavior: Behavior normal.        Thought Content: Thought content normal.        Judgment: Judgment normal.     Results for orders placed or performed in visit on 05/24/20  Novel Coronavirus, NAA  ([facility])   Specimen: Nasopharyngeal(NP) swabs in vial transport medium   Nasopharynge  Result Value Ref Range   SARS-CoV-2, NAA Detected (A) Not Detected  SARS-COV-2, NAA 2 DAY TAT   Nasopharynge  Result Value Ref Range   SARS-CoV-2, NAA 2 DAY TAT Performed       Assessment & Plan:   Problem List Items Addressed This Visit      Other   Anxious depression    She is significantly concerned that anxiety is contributing to her palpitations. Will increase her hydroxyzine and get her into psychiatry. If not better consider sleep study. Phone was acting up last visit so she didn't hear from psychiatry- new referral generated today- they are to call her husband. Continue to monitor. Call with any concerns.       Relevant Medications   hydrOXYzine (ATARAX/VISTARIL) 25 MG tablet   Other Relevant Orders   Ambulatory referral to Psychiatry    Other Visit Diagnoses    Palpitations    -  Primary   EKG normal. Concern for anxiety contributing. Will increase her hydroxyzine and get her into psychiatry. If not better consider sleep study.   Relevant Orders   EKG 12-Lead (Completed)       Follow up plan: Return in about 4 weeks (around 08/01/2020).   >30 minutes spent with patient and her husband today.

## 2020-07-05 ENCOUNTER — Encounter: Payer: Self-pay | Admitting: Family Medicine

## 2020-07-05 DIAGNOSIS — Z79899 Other long term (current) drug therapy: Secondary | ICD-10-CM | POA: Diagnosis not present

## 2020-07-05 NOTE — Assessment & Plan Note (Signed)
She is significantly concerned that anxiety is contributing to her palpitations. Will increase her hydroxyzine and get her into psychiatry. If not better consider sleep study. Phone was acting up last visit so she didn't hear from psychiatry- new referral generated today- they are to call her husband. Continue to monitor. Call with any concerns.

## 2020-07-08 ENCOUNTER — Ambulatory Visit: Payer: Medicaid Other

## 2020-07-08 ENCOUNTER — Other Ambulatory Visit: Payer: Self-pay

## 2020-07-08 DIAGNOSIS — M6281 Muscle weakness (generalized): Secondary | ICD-10-CM

## 2020-07-08 DIAGNOSIS — Z89611 Acquired absence of right leg above knee: Secondary | ICD-10-CM | POA: Diagnosis not present

## 2020-07-08 DIAGNOSIS — R269 Unspecified abnormalities of gait and mobility: Secondary | ICD-10-CM | POA: Diagnosis not present
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
## 2020-07-08 NOTE — Progress Notes (Signed)
Interpreted by me on 07/04/20 NSR at 66bpm, No ST segment abnormalities

## 2020-07-10 ENCOUNTER — Other Ambulatory Visit: Payer: Self-pay

## 2020-07-10 ENCOUNTER — Ambulatory Visit: Payer: Medicaid Other | Attending: Nurse Practitioner

## 2020-07-10 DIAGNOSIS — R269 Unspecified abnormalities of gait and mobility: Secondary | ICD-10-CM | POA: Diagnosis not present

## 2020-07-10 DIAGNOSIS — M6281 Muscle weakness (generalized): Secondary | ICD-10-CM | POA: Insufficient documentation
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
## 2020-07-15 ENCOUNTER — Ambulatory Visit: Payer: Medicaid Other

## 2020-07-16 ENCOUNTER — Other Ambulatory Visit: Payer: Self-pay | Admitting: Family Medicine

## 2020-07-17 ENCOUNTER — Ambulatory Visit: Payer: Medicaid Other

## 2020-07-17 ENCOUNTER — Encounter (INDEPENDENT_AMBULATORY_CARE_PROVIDER_SITE_OTHER): Payer: Self-pay

## 2020-07-17 ENCOUNTER — Telehealth: Payer: Medicaid Other

## 2020-07-18 ENCOUNTER — Telehealth: Payer: Medicaid Other

## 2020-07-22 ENCOUNTER — Ambulatory Visit: Payer: Medicaid Other

## 2020-07-24 ENCOUNTER — Ambulatory Visit: Payer: Medicaid Other

## 2020-07-24 ENCOUNTER — Other Ambulatory Visit: Payer: Self-pay

## 2020-07-24 DIAGNOSIS — R269 Unspecified abnormalities of gait and mobility: Secondary | ICD-10-CM | POA: Diagnosis not present

## 2020-07-24 DIAGNOSIS — M6281 Muscle weakness (generalized): Secondary | ICD-10-CM | POA: Diagnosis not present
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
## 2020-07-26 ENCOUNTER — Other Ambulatory Visit: Payer: Self-pay | Admitting: Family Medicine

## 2020-07-29 ENCOUNTER — Ambulatory Visit: Payer: Medicaid Other

## 2020-07-29 NOTE — Patient Instructions (Incomplete)
TREATMENT   Ther-ex NuStep L0-1 for LE strengthening with assist of BUE and constant monitoring from therapist x 5 minutes; Seated alternating hip flexion x 15 BLE; Standing weight shifts in // bars without UE support x 1 minute; Standing weight shifts in // bars without UE support with L heel lifts to bias RLE x 1 minute; Standing 2" L toe taps with faded UE support progressing to single UE support x 10; Forward ambulation in // bars x 2 lengths each with therapist providing cues to work on improving toe off with RLE;   Secondary school teacher training performed in rehab gym with rolling walker and RLE AKA prosthetic donned 120' x 2. Second set performed with rollator to encouraged decreased reliance on BUE and improve toe off with RLE. Patient provided education about sequencing with rollator, heel strike with right lower extremity, and advancing left lower extremity. Practicedstep through pattern today. Pt with increased anxiety and fatigue during ambulation with rollator due to decreased ability to push through BUE. She tends to avoid flexion of prosethic R knee and instead attempts to circumduct RLE with a locked R knee. Seated rest break provided between sets. No overt loss of balance requiring assistance from therapist to correct.   Therapeutic Activity Stair training performed with patient. Education about proper sequencing on stairs (ascend leading with LLE, descend leading with RLE). Verbal and tactile cues for stepping as well as allowing R prosthetic knee to flex during ascend when lifting RLE to match LLE on step. Continual CGA.  Throughout stair training.   Pt educated throughout session about proper posture and technique with exercises. Improved exercise technique, movement at target joints, use of target muscles after min to mod verbal, visual, tactile cues.   Patient demonstrates excellent motivation during session today.  She does report intermittent pain in  residual RLE which requires some intermittent seated rest breaks.  Therapist progressed pt to using a rollator as she is heavily dependent on BUE support when using the walker which is limiting her ability to strengthen and gain confidence in her RLE. Practiced stairs with patient during session and she demonstrates safe performance with BUE support but does require verbal cues for sequencing and safe stepping.  Patient encouraged to continue HEP and follow-up as scheduled. Pt will benefit from PT services to address deficits in strength, balance, and mobility in order to return to full function at home.

## 2020-07-31 ENCOUNTER — Other Ambulatory Visit: Payer: Self-pay

## 2020-07-31 ENCOUNTER — Ambulatory Visit: Payer: Medicaid Other

## 2020-07-31 DIAGNOSIS — R269 Unspecified abnormalities of gait and mobility: Secondary | ICD-10-CM | POA: Diagnosis not present

## 2020-07-31 DIAGNOSIS — M6281 Muscle weakness (generalized): Secondary | ICD-10-CM | POA: Diagnosis not present
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
## 2020-08-01 ENCOUNTER — Ambulatory Visit (INDEPENDENT_AMBULATORY_CARE_PROVIDER_SITE_OTHER): Payer: Medicaid Other | Admitting: Internal Medicine

## 2020-08-01 ENCOUNTER — Encounter: Payer: Self-pay | Admitting: Internal Medicine

## 2020-08-01 VITALS — BP 101/67 | HR 80 | Temp 98.2°F | Ht 64.96 in | Wt 137.0 lb

## 2020-08-01 DIAGNOSIS — I10 Essential (primary) hypertension: Secondary | ICD-10-CM | POA: Diagnosis not present

## 2020-08-01 DIAGNOSIS — S78119A Complete traumatic amputation at level between unspecified hip and knee, initial encounter: Secondary | ICD-10-CM | POA: Diagnosis not present

## 2020-08-01 DIAGNOSIS — F418 Other specified anxiety disorders: Secondary | ICD-10-CM

## 2020-08-01 MED ORDER — DULOXETINE HCL 20 MG PO CPEP: 20.0000 mg | ORAL_CAPSULE | Freq: Every day | ORAL | 0 refills | Status: DC

## 2020-08-01 MED ORDER — NICOTINE 21 MG/24HR TD PT24: 21.0000 mg | MEDICATED_PATCH | Freq: Every day | TRANSDERMAL | 0 refills | Status: DC

## 2020-08-01 MED ORDER — KETOROLAC TROMETHAMINE 60 MG/2ML IM SOLN
30.0000 mg | Freq: Once | INTRAMUSCULAR | Status: AC
  Administered 2020-08-01: 30 mg via INTRAMUSCULAR

## 2020-08-01 NOTE — Progress Notes (Deleted)
BP 101/67   Pulse 80   Temp 98.2 F (36.8 C) (Oral)   Ht 5' 4.96" (1.65 m)   Wt 137 lb (62.1 kg)   SpO2 96%   BMI 22.83 kg/m    Subjective:    [name], female    DOB: 1976-04-28, 45 y.o.   MRN: [id number]  HPI: Rhonda Martin is a 45 y.o. female  Patient is here for a follow-up.  She has a history of RIGHT AKA has had recurrent falls per her verbal record.  She says she called her vascular surgeons who thinks she might have a fracture of her right FEMUR  Is very jittery and very very anxious at the time of this exam.  This have a history of anxiety and patients for this in the past of note was recently seen by her PCP for the same a couple times in the last month  Depression        This is a chronic problem.  Associated symptoms include no suicidal ideas.  Past medical history includes anxiety.   Anxiety Presents for follow-up visit. Symptoms include chest pain, depressed mood, excessive worry, hyperventilation, irritability, palpitations and shortness of breath. Patient reports no feeling of choking, nervous/anxious behavior, obsessions, panic or suicidal ideas.      Chief Complaint  Patient presents with  . Palpitations  . Anxiety  . Depression  . Leg Injury    Has fallen several times in past week and residual right is giving her a lot of problems. Wants a prescription for a new walker and a new wheel chair.     Relevant past medical, surgical, family and social history reviewed and updated as indicated. Interim medical history since our last visit reviewed. Allergies and medications reviewed and updated.  Review of Systems  Constitutional: Positive for irritability.  Respiratory: Positive for shortness of breath.   Cardiovascular: Positive for chest pain and palpitations.  Psychiatric/Behavioral: Positive for depression. Negative for suicidal ideas. The patient is not nervous/anxious.     Per HPI unless specifically indicated above      Objective:    BP 101/67   Pulse 80   Temp 98.2 F (36.8 C) (Oral)   Ht 5' 4.96" (1.65 m)   Wt 137 lb (62.1 kg)   SpO2 96%   BMI 22.83 kg/m   Wt Readings from Last 3 Encounters:  08/01/20 137 lb (62.1 kg)  07/04/20 137 lb (62.1 kg)  05/13/20 128 lb (58.1 kg)    Physical Exam  Results for orders placed or performed in visit on 05/24/20  Novel Coronavirus, NAA ([facility])   Specimen: Nasopharyngeal(NP) swabs in vial transport medium   Nasopharynge  Result Value Ref Range   SARS-CoV-2, NAA Detected (A) Not Detected  SARS-COV-2, NAA 2 DAY TAT   Nasopharynge  Result Value Ref Range   SARS-CoV-2, NAA 2 DAY TAT Performed       Assessment & Plan:  1.  Right AKA: Complains of chronic pain to touch base with vascular surgery will need x-rays of such  Patient was in the exam room she received a call from her vascular surgeon and she is to be seen by them ASAP. She needs a prescription for a wheelchair and a different walker which she will touch base with them about  2.  Anxiety Will restart patient on Cymbalta was on this in the past did seem to help failed other treatments as in HPI RTC IN 1 MONTH FOR  A FU with labs  Problem List Items Addressed This Visit      Cardiovascular and Mediastinum   HTN (hypertension) - Primary   Relevant Orders   Comp Met (CMET)   CBC w/Diff     Other   Anxious depression   Relevant Medications   DULoxetine (CYMBALTA) 20 MG capsule   Other Relevant Orders   Lipid Profile   TSH   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[place])       Follow up plan: Return in about 4 weeks (around 08/29/2020).

## 2020-08-01 NOTE — Progress Notes (Signed)
BP 101/67   Pulse 80   Temp 98.2 F (36.8 C) (Oral)   Ht 5' 4.96" (1.65 m)   Wt 137 lb (62.1 kg)   SpO2 96%   BMI 22.83 kg/m    Subjective:    [name], female    DOB: 1976-04-07, 45 y.o.   MRN: [id number]  HPI:  Pain control is on oxycodone for such - and per pts verbal record pt is in a lot of pain.  Anxiety Presents for follow-up (is on atarax for such, has had shaking from suc  had an EKG) visit. Symptoms include chest pain, excessive worry, feeling of choking, hyperventilation, nervous/anxious behavior, palpitations and panic. Patient reports no compulsions, confusion, decreased concentration, depressed mood, dizziness, dry mouth, irritability, malaise, muscle tension, nausea, restlessness, shortness of breath or suicidal ideas.    Depression        This is a chronic problem.  The current episode started more than 1 year ago.   Associated symptoms include no decreased concentration, no restlessness and no suicidal ideas.  Past medical history includes anxiety.     JORETTA Martin is a 45 y.o. female  Pt has a right AKA fell x 2, per pt she spoke to her vascular surgeons.  Has had a problem with her prosthetic stays locked when its not supposed to be.   Chief Complaint  Patient presents with  . Palpitations  . Anxiety  . Depression  . Leg Injury    Has fallen several times in past week and residual right is giving her a lot of problems. Wants a prescription for a new walker and a new wheel chair.     Relevant past medical, surgical, family and social history reviewed and updated as indicated. Interim medical history since our last visit reviewed. Allergies and medications reviewed and updated.  Review of Systems  Constitutional: Negative for irritability.  Respiratory: Negative for shortness of breath.   Cardiovascular: Positive for chest pain and palpitations.  Gastrointestinal: Negative for nausea.  Neurological: Negative for dizziness.   Psychiatric/Behavioral: Positive for depression. Negative for confusion, decreased concentration and suicidal ideas. The patient is nervous/anxious.     Per HPI unless specifically indicated above     Objective:    BP 101/67   Pulse 80   Temp 98.2 F (36.8 C) (Oral)   Ht 5' 4.96" (1.65 m)   Wt 137 lb (62.1 kg)   SpO2 96%   BMI 22.83 kg/m   Wt Readings from Last 3 Encounters:  08/01/20 137 lb (62.1 kg)  07/04/20 137 lb (62.1 kg)  05/13/20 128 lb (58.1 kg)    Physical Exam  Results for orders placed or performed in visit on 05/24/20  Novel Coronavirus, NAA ([facility])   Specimen: Nasopharyngeal(NP) swabs in vial transport medium   Nasopharynge  Result Value Ref Range   SARS-CoV-2, NAA Detected (A) Not Detected  SARS-COV-2, NAA 2 DAY TAT   Nasopharynge  Result Value Ref Range   SARS-CoV-2, NAA 2 DAY TAT Performed       Assessment & Plan:   1. Anxious / depression:  Is very anxious declines celexa / efffexor.  Failed in the past.  Will start - cymbalta 20 mg daily.  Is also on hydroxyzine for such TO STOP  Pt will need to touch base with vascular surgery  Needs a new wheelchair and new walker with seat  PT   2. Smoking cessation : smokes 1/2 ppd x 35 years  or so.  Smoking cessation advised. Restart nicoderm patches  failed nicotine patches in the past. continues to smoke. more than > 5 - 10 mins of time was spent with pt regarding smoking cessation and complications.  3.  Right AKA: Complains of chronic pain to touch base with vascular surgery will need x-rays of such  Patient was in the exam room she received a call from her vascular surgeon and she is to be seen by them ASAP. She needs a prescription for a wheelchair and a different walker which she will touch base with them about IM Toradol administered for patient at this visit   RTC IN 1 MONTH FOR A FU with labs      Problem List Items Addressed This Visit   None      Follow up plan: No follow-ups  on file.

## 2020-08-02 ENCOUNTER — Ambulatory Visit (INDEPENDENT_AMBULATORY_CARE_PROVIDER_SITE_OTHER): Payer: Medicaid Other | Admitting: Nurse Practitioner

## 2020-08-02 ENCOUNTER — Encounter (INDEPENDENT_AMBULATORY_CARE_PROVIDER_SITE_OTHER): Payer: Medicaid Other

## 2020-08-05 ENCOUNTER — Ambulatory Visit: Payer: Medicaid Other

## 2020-08-05 DIAGNOSIS — Z79899 Other long term (current) drug therapy: Secondary | ICD-10-CM | POA: Diagnosis not present

## 2020-08-07 ENCOUNTER — Other Ambulatory Visit (INDEPENDENT_AMBULATORY_CARE_PROVIDER_SITE_OTHER): Payer: Self-pay | Admitting: Nurse Practitioner

## 2020-08-07 ENCOUNTER — Ambulatory Visit: Payer: Medicaid Other | Attending: Nurse Practitioner

## 2020-08-07 DIAGNOSIS — M79604 Pain in right leg: Secondary | ICD-10-CM

## 2020-08-07 DIAGNOSIS — Z89611 Acquired absence of right leg above knee: Secondary | ICD-10-CM

## 2020-08-08 ENCOUNTER — Ambulatory Visit (INDEPENDENT_AMBULATORY_CARE_PROVIDER_SITE_OTHER): Payer: Medicaid Other | Admitting: Nurse Practitioner

## 2020-08-08 ENCOUNTER — Ambulatory Visit (INDEPENDENT_AMBULATORY_CARE_PROVIDER_SITE_OTHER): Payer: Medicaid Other

## 2020-08-08 ENCOUNTER — Other Ambulatory Visit: Payer: Self-pay

## 2020-08-08 ENCOUNTER — Encounter (INDEPENDENT_AMBULATORY_CARE_PROVIDER_SITE_OTHER): Payer: Self-pay | Admitting: Nurse Practitioner

## 2020-08-08 VITALS — BP 97/61 | HR 71 | Resp 15

## 2020-08-08 DIAGNOSIS — Z89611 Acquired absence of right leg above knee: Secondary | ICD-10-CM

## 2020-08-08 DIAGNOSIS — M79604 Pain in right leg: Secondary | ICD-10-CM

## 2020-08-08 DIAGNOSIS — I10 Essential (primary) hypertension: Secondary | ICD-10-CM | POA: Diagnosis not present

## 2020-08-08 MED ORDER — HYDROCODONE-IBUPROFEN 5-200 MG PO TABS
1.0000 | ORAL_TABLET | Freq: Three times a day (TID) | ORAL | 0 refills | Status: DC | PRN
Start: 2020-08-08 — End: 2020-08-26

## 2020-08-13 ENCOUNTER — Encounter (INDEPENDENT_AMBULATORY_CARE_PROVIDER_SITE_OTHER): Payer: Self-pay | Admitting: Nurse Practitioner
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
## 2020-08-14 ENCOUNTER — Ambulatory Visit: Payer: Medicaid Other

## 2020-08-14 ENCOUNTER — Other Ambulatory Visit: Payer: Self-pay | Admitting: Family Medicine

## 2020-08-14 NOTE — Telephone Encounter (Signed)
Requested medication (s) are due for refill today: yes  Requested medication (s) are on the active medication list: yes  Last refill:  05/13/20 #30 2 refills   Future visit scheduled: yes in 2 weeks  Notes to clinic:  not delegated per protocol     Requested Prescriptions  Pending Prescriptions Disp Refills   zolpidem (AMBIEN) 5 MG tablet [Pharmacy Med Name: ZOLPIDEM TARTRATE 5 MG TABLET] 30 tablet 2    Sig: TAKE 1 TABLET BY MOUTH AT BEDTIME AS NEEDED FOR SLEEP.      Not Delegated - Psychiatry:  Anxiolytics/Hypnotics Failed - 08/14/2020  2:19 PM      Failed - This refill cannot be delegated      Passed - Urine Drug Screen completed in last 360 days      Passed - Valid encounter within last 6 months    Recent Outpatient Visits           1 week ago Primary hypertension   [name] Vigg, Avanti, MD   1 month ago Palpitations   [facility], [place], DO   3 months ago Anxious depression   [place], [place], DO   7 months ago Pre-op exam   [place], [place]   9 months ago Hematemesis with nausea   [place], Lilia Argue, [place]       Future Appointments             In 2 weeks Vigg, Avanti, MD [facility], [place]

## 2020-08-15 ENCOUNTER — Telehealth (INDEPENDENT_AMBULATORY_CARE_PROVIDER_SITE_OTHER): Payer: Self-pay

## 2020-08-15 NOTE — Telephone Encounter (Signed)
I attempted to contact the patient to schedule a RLE angio and was unable to make contact as the patient's voice mail is not set up.

## 2020-08-19 ENCOUNTER — Emergency Department
Admission: EM | Admit: 2020-08-19 | Discharge: 2020-08-19 | Disposition: A | Discharge status: Home / Self Care | Payer: Medicaid Other | Attending: Emergency Medicine | Admitting: Emergency Medicine

## 2020-08-19 ENCOUNTER — Emergency Department: Payer: Medicaid Other

## 2020-08-19 ENCOUNTER — Other Ambulatory Visit: Payer: Self-pay

## 2020-08-19 DIAGNOSIS — Z89611 Acquired absence of right leg above knee: Secondary | ICD-10-CM | POA: Diagnosis not present

## 2020-08-19 DIAGNOSIS — R519 Headache, unspecified: Secondary | ICD-10-CM | POA: Insufficient documentation

## 2020-08-19 DIAGNOSIS — Z8541 Personal history of malignant neoplasm of cervix uteri: Secondary | ICD-10-CM | POA: Insufficient documentation

## 2020-08-19 DIAGNOSIS — J439 Emphysema, unspecified: Secondary | ICD-10-CM | POA: Diagnosis not present

## 2020-08-19 DIAGNOSIS — F17210 Nicotine dependence, cigarettes, uncomplicated: Secondary | ICD-10-CM | POA: Diagnosis not present

## 2020-08-19 DIAGNOSIS — M542 Cervicalgia: Secondary | ICD-10-CM | POA: Insufficient documentation

## 2020-08-19 DIAGNOSIS — M545 Low back pain, unspecified: Secondary | ICD-10-CM | POA: Diagnosis not present

## 2020-08-19 DIAGNOSIS — R0602 Shortness of breath: Secondary | ICD-10-CM | POA: Diagnosis not present

## 2020-08-19 DIAGNOSIS — J9811 Atelectasis: Secondary | ICD-10-CM | POA: Diagnosis not present

## 2020-08-19 DIAGNOSIS — M25511 Pain in right shoulder: Secondary | ICD-10-CM | POA: Diagnosis not present

## 2020-08-19 DIAGNOSIS — J449 Chronic obstructive pulmonary disease, unspecified: Secondary | ICD-10-CM | POA: Diagnosis not present

## 2020-08-19 DIAGNOSIS — S199XXA Unspecified injury of neck, initial encounter: Secondary | ICD-10-CM | POA: Diagnosis not present

## 2020-08-19 DIAGNOSIS — R0789 Other chest pain: Secondary | ICD-10-CM | POA: Diagnosis not present

## 2020-08-19 DIAGNOSIS — R109 Unspecified abdominal pain: Secondary | ICD-10-CM | POA: Diagnosis not present

## 2020-08-19 DIAGNOSIS — Z79899 Other long term (current) drug therapy: Secondary | ICD-10-CM | POA: Diagnosis not present

## 2020-08-19 DIAGNOSIS — M4856XA Collapsed vertebra, not elsewhere classified, lumbar region, initial encounter for fracture: Secondary | ICD-10-CM | POA: Diagnosis not present

## 2020-08-19 DIAGNOSIS — M47812 Spondylosis without myelopathy or radiculopathy, cervical region: Secondary | ICD-10-CM | POA: Diagnosis not present

## 2020-08-19 DIAGNOSIS — M5126 Other intervertebral disc displacement, lumbar region: Secondary | ICD-10-CM | POA: Diagnosis not present

## 2020-08-19 DIAGNOSIS — W19XXXA Unspecified fall, initial encounter: Secondary | ICD-10-CM

## 2020-08-19 DIAGNOSIS — I10 Essential (primary) hypertension: Secondary | ICD-10-CM | POA: Diagnosis not present

## 2020-08-19 DIAGNOSIS — M4312 Spondylolisthesis, cervical region: Secondary | ICD-10-CM | POA: Diagnosis not present

## 2020-08-19 DIAGNOSIS — F1190 Opioid use, unspecified, uncomplicated: Secondary | ICD-10-CM | POA: Insufficient documentation

## 2020-08-19 DIAGNOSIS — W1839XA Other fall on same level, initial encounter: Secondary | ICD-10-CM | POA: Diagnosis not present

## 2020-08-19 DIAGNOSIS — S0990XA Unspecified injury of head, initial encounter: Secondary | ICD-10-CM | POA: Diagnosis not present

## 2020-08-19 DIAGNOSIS — Z96651 Presence of right artificial knee joint: Secondary | ICD-10-CM | POA: Diagnosis not present

## 2020-08-19 DIAGNOSIS — Z8616 Personal history of COVID-19: Secondary | ICD-10-CM | POA: Insufficient documentation

## 2020-08-19 DIAGNOSIS — R296 Repeated falls: Secondary | ICD-10-CM | POA: Diagnosis not present

## 2020-08-19 DIAGNOSIS — M47816 Spondylosis without myelopathy or radiculopathy, lumbar region: Secondary | ICD-10-CM | POA: Diagnosis not present

## 2020-08-19 LAB — URINE DRUG SCREEN, QUALITATIVE (ARMC ONLY)
Amphetamines, Ur Screen: NOT DETECTED
Barbiturates, Ur Screen: NOT DETECTED
Benzodiazepine, Ur Scrn: NOT DETECTED
Cannabinoid 50 Ng, Ur ~~LOC~~: NOT DETECTED
Cocaine Metabolite,Ur ~~LOC~~: NOT DETECTED
MDMA (Ecstasy)Ur Screen: NOT DETECTED
Methadone Scn, Ur: NOT DETECTED
Opiate, Ur Screen: POSITIVE — AB
Phencyclidine (PCP) Ur S: NOT DETECTED
Tricyclic, Ur Screen: NOT DETECTED

## 2020-08-19 LAB — URINALYSIS, COMPLETE (UACMP) WITH MICROSCOPIC
Bilirubin Urine: NEGATIVE
Glucose, UA: NEGATIVE mg/dL
Hgb urine dipstick: NEGATIVE
Ketones, ur: NEGATIVE mg/dL
Leukocytes,Ua: NEGATIVE
Nitrite: NEGATIVE
Protein, ur: NEGATIVE mg/dL
Specific Gravity, Urine: 1.003 — ABNORMAL LOW (ref 1.005–1.030)
pH: 7 (ref 5.0–8.0)

## 2020-08-19 LAB — CBC
HCT: 39.1 % (ref 36.0–46.0)
Hemoglobin: 13.2 g/dL (ref 12.0–15.0)
MCH: 29.8 pg (ref 26.0–34.0)
MCHC: 33.8 g/dL (ref 30.0–36.0)
MCV: 88.3 fL (ref 80.0–100.0)
Platelets: 58 10*3/uL — ABNORMAL LOW (ref 150–400)
RBC: 4.43 MIL/uL (ref 3.87–5.11)
RDW: 17.4 % — ABNORMAL HIGH (ref 11.5–15.5)
WBC: 6.4 10*3/uL (ref 4.0–10.5)
nRBC: 0 % (ref 0.0–0.2)

## 2020-08-19 LAB — BASIC METABOLIC PANEL
Anion gap: 7 (ref 5–15)
BUN: 12 mg/dL (ref 6–20)
CO2: 21 mmol/L — ABNORMAL LOW (ref 22–32)
Calcium: 8.3 mg/dL — ABNORMAL LOW (ref 8.9–10.3)
Chloride: 108 mmol/L (ref 98–111)
Creatinine, Ser: 0.41 mg/dL — ABNORMAL LOW (ref 0.44–1.00)
GFR, Estimated: >60 mL/min (ref >60)
Glucose, Bld: 106 mg/dL — ABNORMAL HIGH (ref 70–99)
Potassium: 3.9 mmol/L (ref 3.5–5.1)
Sodium: 136 mmol/L (ref 135–145)

## 2020-08-19 LAB — HEPATIC FUNCTION PANEL
ALT: 36 U/L (ref 0–44)
AST: 67 U/L — ABNORMAL HIGH (ref 15–41)
Albumin: 3.4 g/dL — ABNORMAL LOW (ref 3.5–5.0)
Alkaline Phosphatase: 90 U/L (ref 38–126)
Bilirubin, Direct: 0.3 mg/dL — ABNORMAL HIGH (ref 0.0–0.2)
Indirect Bilirubin: 0.5 mg/dL (ref 0.3–0.9)
Total Bilirubin: 0.8 mg/dL (ref 0.3–1.2)
Total Protein: 7 g/dL (ref 6.5–8.1)

## 2020-08-19 LAB — POC URINE PREG, ED: Preg Test, Ur: NEGATIVE

## 2020-08-19 LAB — ETHANOL: Alcohol, Ethyl (B): 98 mg/dL — ABNORMAL HIGH (ref <10)

## 2020-08-19 LAB — HCG, QUANTITATIVE, PREGNANCY: hCG, Beta Chain, Quant, S: <1 m[IU]/mL (ref <5)

## 2020-08-19 LAB — TROPONIN I (HIGH SENSITIVITY): Troponin I (High Sensitivity): 6 ng/L (ref <18)

## 2020-08-19 MED ORDER — IOHEXOL 350 MG/ML SOLN
75.0000 mL | Freq: Once | INTRAVENOUS | Status: AC | PRN
  Administered 2020-08-19: 75 mL via INTRAVENOUS
  Filled 2020-08-19: qty 75

## 2020-08-19 MED ORDER — OXYCODONE HCL 5 MG PO TABS
5.0000 mg | ORAL_TABLET | Freq: Once | ORAL | Status: AC
  Administered 2020-08-19: 5 mg via ORAL
  Filled 2020-08-19: qty 1

## 2020-08-19 NOTE — ED Notes (Signed)
See triage note  Presents with right shoulder/arm pain states she has had some falls recently    She has a RKA and uses a w/c   But states she has been able to states and pivot  Also has had COVID in Dec  Still feels SOB and weak
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
## 2020-08-19 NOTE — Discharge Instructions (Addendum)
Work-up was reassuring and similar to your baseline. Return to the ER if develop worsening symptoms or any other concerns  IMPRESSION: No acute or traumatic finding. Advanced facet osteoarthritis on the right at C3-4 with 3 mm of anterolisthesis. Moderate right foraminal stenosis. Lesser right-sided facet arthropathy at L2-3 with some right foraminal narrowing at that level as well.   Emphysema.
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
## 2020-08-22 ENCOUNTER — Other Ambulatory Visit: Payer: Medicaid Other

## 2020-08-25 ENCOUNTER — Other Ambulatory Visit: Payer: Self-pay

## 2020-08-25 ENCOUNTER — Observation Stay
Admission: EM | Admit: 2020-08-25 | Discharge: 2020-08-26 | DRG: 951 | Disposition: A | Discharge status: Home / Self Care | Payer: Medicaid Other | Attending: Hospitalist | Admitting: Hospitalist

## 2020-08-25 ENCOUNTER — Emergency Department: Payer: Medicaid Other

## 2020-08-25 DIAGNOSIS — F419 Anxiety disorder, unspecified: Secondary | ICD-10-CM | POA: Insufficient documentation

## 2020-08-25 DIAGNOSIS — Z720 Tobacco use: Secondary | ICD-10-CM

## 2020-08-25 DIAGNOSIS — D696 Thrombocytopenia, unspecified: Secondary | ICD-10-CM | POA: Diagnosis not present

## 2020-08-25 DIAGNOSIS — F10231 Alcohol dependence with withdrawal delirium: Secondary | ICD-10-CM | POA: Diagnosis not present

## 2020-08-25 DIAGNOSIS — J9811 Atelectasis: Secondary | ICD-10-CM | POA: Diagnosis not present

## 2020-08-25 DIAGNOSIS — B182 Chronic viral hepatitis C: Secondary | ICD-10-CM | POA: Diagnosis not present

## 2020-08-25 DIAGNOSIS — K7460 Unspecified cirrhosis of liver: Secondary | ICD-10-CM | POA: Insufficient documentation

## 2020-08-25 DIAGNOSIS — F10931 Alcohol use, unspecified with withdrawal delirium: Secondary | ICD-10-CM

## 2020-08-25 DIAGNOSIS — R002 Palpitations: Secondary | ICD-10-CM | POA: Diagnosis not present

## 2020-08-25 DIAGNOSIS — I8510 Secondary esophageal varices without bleeding: Secondary | ICD-10-CM | POA: Diagnosis present

## 2020-08-25 DIAGNOSIS — Z20822 Contact with and (suspected) exposure to covid-19: Secondary | ICD-10-CM | POA: Diagnosis not present

## 2020-08-25 DIAGNOSIS — R4182 Altered mental status, unspecified: Secondary | ICD-10-CM | POA: Diagnosis not present

## 2020-08-25 DIAGNOSIS — R079 Chest pain, unspecified: Secondary | ICD-10-CM | POA: Diagnosis not present

## 2020-08-25 DIAGNOSIS — R059 Cough, unspecified: Secondary | ICD-10-CM | POA: Insufficient documentation

## 2020-08-25 DIAGNOSIS — R0602 Shortness of breath: Secondary | ICD-10-CM | POA: Insufficient documentation

## 2020-08-25 DIAGNOSIS — F1010 Alcohol abuse, uncomplicated: Secondary | ICD-10-CM | POA: Diagnosis not present

## 2020-08-25 DIAGNOSIS — R0789 Other chest pain: Secondary | ICD-10-CM | POA: Diagnosis not present

## 2020-08-25 DIAGNOSIS — R000 Tachycardia, unspecified: Secondary | ICD-10-CM | POA: Diagnosis not present

## 2020-08-25 DIAGNOSIS — J441 Chronic obstructive pulmonary disease with (acute) exacerbation: Secondary | ICD-10-CM | POA: Diagnosis present

## 2020-08-25 DIAGNOSIS — J449 Chronic obstructive pulmonary disease, unspecified: Secondary | ICD-10-CM | POA: Diagnosis not present

## 2020-08-25 DIAGNOSIS — U071 COVID-19: Secondary | ICD-10-CM | POA: Diagnosis not present

## 2020-08-25 DIAGNOSIS — I10 Essential (primary) hypertension: Secondary | ICD-10-CM | POA: Diagnosis not present

## 2020-08-25 DIAGNOSIS — F17210 Nicotine dependence, cigarettes, uncomplicated: Secondary | ICD-10-CM | POA: Diagnosis not present

## 2020-08-25 LAB — URINALYSIS, COMPLETE (UACMP) WITH MICROSCOPIC
Bilirubin Urine: NEGATIVE
Glucose, UA: NEGATIVE mg/dL
Hgb urine dipstick: NEGATIVE
Ketones, ur: NEGATIVE mg/dL
Nitrite: NEGATIVE
Protein, ur: NEGATIVE mg/dL
Specific Gravity, Urine: 1.029 (ref 1.005–1.030)
pH: 7 (ref 5.0–8.0)

## 2020-08-25 LAB — CBC
HCT: 36.9 % (ref 36.0–46.0)
Hemoglobin: 13.4 g/dL (ref 12.0–15.0)
MCH: 30.9 pg (ref 26.0–34.0)
MCHC: 36.3 g/dL — ABNORMAL HIGH (ref 30.0–36.0)
MCV: 85.2 fL (ref 80.0–100.0)
Platelets: 61 10*3/uL — ABNORMAL LOW (ref 150–400)
RBC: 4.33 MIL/uL (ref 3.87–5.11)
RDW: 16.8 % — ABNORMAL HIGH (ref 11.5–15.5)
WBC: 3.3 10*3/uL — ABNORMAL LOW (ref 4.0–10.5)
nRBC: 0 % (ref 0.0–0.2)

## 2020-08-25 LAB — URINE DRUG SCREEN, QUALITATIVE (ARMC ONLY)
Amphetamines, Ur Screen: NOT DETECTED
Barbiturates, Ur Screen: NOT DETECTED
Benzodiazepine, Ur Scrn: NOT DETECTED
Cannabinoid 50 Ng, Ur ~~LOC~~: NOT DETECTED
Cocaine Metabolite,Ur ~~LOC~~: NOT DETECTED
MDMA (Ecstasy)Ur Screen: NOT DETECTED
Methadone Scn, Ur: NOT DETECTED
Opiate, Ur Screen: POSITIVE — AB
Phencyclidine (PCP) Ur S: NOT DETECTED
Tricyclic, Ur Screen: POSITIVE — AB

## 2020-08-25 LAB — COMPREHENSIVE METABOLIC PANEL
ALT: 34 U/L (ref 0–44)
ALT: 32 U/L (ref 0–44)
AST: 52 U/L — ABNORMAL HIGH (ref 15–41)
AST: 42 U/L — ABNORMAL HIGH (ref 15–41)
Albumin: 3.8 g/dL (ref 3.5–5.0)
Albumin: 3.5 g/dL (ref 3.5–5.0)
Alkaline Phosphatase: 86 U/L (ref 38–126)
Alkaline Phosphatase: 75 U/L (ref 38–126)
Anion gap: 10 (ref 5–15)
Anion gap: 9 (ref 5–15)
BUN: <5 mg/dL — ABNORMAL LOW (ref 6–20)
BUN: <5 mg/dL — ABNORMAL LOW (ref 6–20)
CO2: 20 mmol/L — ABNORMAL LOW (ref 22–32)
CO2: 21 mmol/L — ABNORMAL LOW (ref 22–32)
Calcium: 8.9 mg/dL (ref 8.9–10.3)
Calcium: 8.5 mg/dL — ABNORMAL LOW (ref 8.9–10.3)
Chloride: 108 mmol/L (ref 98–111)
Chloride: 108 mmol/L (ref 98–111)
Creatinine, Ser: 0.44 mg/dL (ref 0.44–1.00)
Creatinine, Ser: 0.34 mg/dL — ABNORMAL LOW (ref 0.44–1.00)
GFR, Estimated: >60 mL/min (ref >60)
GFR, Estimated: >60 mL/min (ref >60)
Glucose, Bld: 115 mg/dL — ABNORMAL HIGH (ref 70–99)
Glucose, Bld: 111 mg/dL — ABNORMAL HIGH (ref 70–99)
Potassium: 3.3 mmol/L — ABNORMAL LOW (ref 3.5–5.1)
Potassium: 3.2 mmol/L — ABNORMAL LOW (ref 3.5–5.1)
Sodium: 138 mmol/L (ref 135–145)
Sodium: 138 mmol/L (ref 135–145)
Total Bilirubin: 1.8 mg/dL — ABNORMAL HIGH (ref 0.3–1.2)
Total Bilirubin: 2 mg/dL — ABNORMAL HIGH (ref 0.3–1.2)
Total Protein: 7.7 g/dL (ref 6.5–8.1)
Total Protein: 7 g/dL (ref 6.5–8.1)

## 2020-08-25 LAB — CBC WITH DIFFERENTIAL/PLATELET
Abs Immature Granulocytes: 0.01 10*3/uL (ref 0.00–0.07)
Basophils Absolute: 0 10*3/uL (ref 0.0–0.1)
Basophils Relative: 1 %
Eosinophils Absolute: 0 10*3/uL (ref 0.0–0.5)
Eosinophils Relative: 1 %
HCT: 39.9 % (ref 36.0–46.0)
Hemoglobin: 14 g/dL (ref 12.0–15.0)
Immature Granulocytes: 0 %
Lymphocytes Relative: 17 %
Lymphs Abs: 0.7 10*3/uL (ref 0.7–4.0)
MCH: 29.9 pg (ref 26.0–34.0)
MCHC: 35.1 g/dL (ref 30.0–36.0)
MCV: 85.1 fL (ref 80.0–100.0)
Monocytes Absolute: 0.5 10*3/uL (ref 0.1–1.0)
Monocytes Relative: 11 %
Neutro Abs: 3 10*3/uL (ref 1.7–7.7)
Neutrophils Relative %: 70 %
Platelets: 68 10*3/uL — ABNORMAL LOW (ref 150–400)
RBC: 4.69 MIL/uL (ref 3.87–5.11)
RDW: 16.6 % — ABNORMAL HIGH (ref 11.5–15.5)
WBC: 4.3 10*3/uL (ref 4.0–10.5)
nRBC: 0 % (ref 0.0–0.2)

## 2020-08-25 LAB — AMMONIA: Ammonia: 41 umol/L — ABNORMAL HIGH (ref 9–35)

## 2020-08-25 LAB — ETHANOL: Alcohol, Ethyl (B): <10 mg/dL (ref <10)

## 2020-08-25 LAB — TROPONIN I (HIGH SENSITIVITY)
Troponin I (High Sensitivity): 6 ng/L (ref <18)
Troponin I (High Sensitivity): 8 ng/L (ref <18)

## 2020-08-25 LAB — PHOSPHORUS: Phosphorus: 2.9 mg/dL (ref 2.5–4.6)

## 2020-08-25 LAB — MAGNESIUM
Magnesium: 1.8 mg/dL (ref 1.7–2.4)
Magnesium: 2.1 mg/dL (ref 1.7–2.4)

## 2020-08-25 LAB — SARS CORONAVIRUS 2 (TAT 6-24 HRS): SARS Coronavirus 2: NEGATIVE

## 2020-08-25 MED ORDER — LORAZEPAM 2 MG/ML IJ SOLN
1.0000 mg | INTRAMUSCULAR | Status: DC | PRN
  Administered 2020-08-26: 2 mg via INTRAVENOUS
  Filled 2020-08-25: qty 1

## 2020-08-25 MED ORDER — NICOTINE 7 MG/24HR TD PT24
7.0000 mg | MEDICATED_PATCH | Freq: Every day | TRANSDERMAL | Status: DC
  Filled 2020-08-25 (×2): qty 1

## 2020-08-25 MED ORDER — IPRATROPIUM-ALBUTEROL 0.5-2.5 (3) MG/3ML IN SOLN: 3.0000 mL | Freq: Four times a day (QID) | RESPIRATORY_TRACT | Status: DC | PRN

## 2020-08-25 MED ORDER — IOHEXOL 350 MG/ML SOLN
75.0000 mL | Freq: Once | INTRAVENOUS | Status: AC | PRN
  Administered 2020-08-25: 75 mL via INTRAVENOUS

## 2020-08-25 MED ORDER — ZOLPIDEM TARTRATE 5 MG PO TABS: 5.0000 mg | ORAL_TABLET | Freq: Every evening | ORAL | Status: DC | PRN

## 2020-08-25 MED ORDER — ALBUTEROL SULFATE (2.5 MG/3ML) 0.083% IN NEBU
2.5000 mg | INHALATION_SOLUTION | Freq: Four times a day (QID) | RESPIRATORY_TRACT | Status: DC
  Administered 2020-08-25 – 2020-08-26 (×2): 2.5 mg via RESPIRATORY_TRACT
  Filled 2020-08-25 (×2): qty 3

## 2020-08-25 MED ORDER — LORAZEPAM 2 MG/ML IJ SOLN
1.0000 mg | Freq: Once | INTRAMUSCULAR | Status: AC
  Administered 2020-08-25: 1 mg via INTRAVENOUS
  Filled 2020-08-25: qty 1

## 2020-08-25 MED ORDER — MORPHINE SULFATE (PF) 2 MG/ML IV SOLN
2.0000 mg | INTRAVENOUS | Status: DC | PRN
  Administered 2020-08-25 – 2020-08-26 (×2): 2 mg via INTRAVENOUS
  Filled 2020-08-25 (×2): qty 1

## 2020-08-25 MED ORDER — BUPRENORPHINE 5 MCG/HR TD PTWK
1.0000 | MEDICATED_PATCH | TRANSDERMAL | Status: DC
Start: 2020-08-26 — End: 2020-08-26
  Filled 2020-08-25: qty 1

## 2020-08-25 MED ORDER — BUPRENORPHINE 5 MCG/HR TD PTWK
1.0000 | MEDICATED_PATCH | TRANSDERMAL | Status: DC
Start: 2020-08-25 — End: 2020-08-25
  Filled 2020-08-25: qty 1

## 2020-08-25 MED ORDER — GABAPENTIN 600 MG PO TABS
1200.0000 mg | ORAL_TABLET | Freq: Three times a day (TID) | ORAL | Status: DC
  Administered 2020-08-25 – 2020-08-26 (×3): 1200 mg via ORAL
  Filled 2020-08-25 (×3): qty 2

## 2020-08-25 MED ORDER — LORAZEPAM 1 MG PO TABS
1.0000 mg | ORAL_TABLET | ORAL | Status: DC | PRN
  Administered 2020-08-25 – 2020-08-26 (×2): 1 mg via ORAL
  Filled 2020-08-25 (×2): qty 1

## 2020-08-25 MED ORDER — LACTATED RINGERS IV BOLUS
1000.0000 mL | Freq: Once | INTRAVENOUS | Status: AC
  Administered 2020-08-25: 1000 mL via INTRAVENOUS

## 2020-08-25 MED ORDER — THIAMINE HCL 100 MG/ML IJ SOLN
100.0000 mg | Freq: Every day | INTRAMUSCULAR | Status: DC
  Administered 2020-08-26: 100 mg via INTRAVENOUS
  Filled 2020-08-25: qty 2

## 2020-08-25 MED ORDER — ACETAMINOPHEN 650 MG RE SUPP: 650.0000 mg | Freq: Four times a day (QID) | RECTAL | Status: DC | PRN

## 2020-08-25 MED ORDER — THIAMINE HCL 100 MG/ML IJ SOLN
500.0000 mg | Freq: Once | INTRAMUSCULAR | Status: AC
  Administered 2020-08-25: 500 mg via INTRAVENOUS
  Filled 2020-08-25: qty 6

## 2020-08-25 MED ORDER — MAGNESIUM SULFATE IN D5W 1-5 GM/100ML-% IV SOLN
1.0000 g | Freq: Once | INTRAVENOUS | Status: AC
  Administered 2020-08-25: 1 g via INTRAVENOUS
  Filled 2020-08-25: qty 100

## 2020-08-25 MED ORDER — FOLIC ACID 1 MG PO TABS
1.0000 mg | ORAL_TABLET | Freq: Every day | ORAL | Status: DC
  Administered 2020-08-25 – 2020-08-26 (×2): 1 mg via ORAL
  Filled 2020-08-25 (×2): qty 1

## 2020-08-25 MED ORDER — PANTOPRAZOLE SODIUM 40 MG IV SOLR
40.0000 mg | INTRAVENOUS | Status: DC
  Administered 2020-08-25: 40 mg via INTRAVENOUS
  Filled 2020-08-25: qty 40

## 2020-08-25 MED ORDER — OXYCODONE HCL 5 MG PO TABS
5.0000 mg | ORAL_TABLET | Freq: Four times a day (QID) | ORAL | Status: DC | PRN
Start: 2020-08-25 — End: 2020-08-26
  Administered 2020-08-26: 5 mg via ORAL
  Filled 2020-08-25: qty 1

## 2020-08-25 MED ORDER — POTASSIUM CHLORIDE 10 MEQ/100ML IV SOLN
10.0000 meq | INTRAVENOUS | Status: AC
  Filled 2020-08-25: qty 100

## 2020-08-25 MED ORDER — ADULT MULTIVITAMIN W/MINERALS CH
1.0000 | ORAL_TABLET | Freq: Every day | ORAL | Status: DC
  Administered 2020-08-25 – 2020-08-26 (×2): 1 via ORAL
  Filled 2020-08-25 (×2): qty 1

## 2020-08-25 MED ORDER — THIAMINE HCL 100 MG PO TABS
100.0000 mg | ORAL_TABLET | Freq: Every day | ORAL | Status: DC
  Administered 2020-08-25: 100 mg via ORAL
  Filled 2020-08-25 (×2): qty 1

## 2020-08-25 MED ORDER — HYDROXYZINE HCL 25 MG PO TABS
25.0000 mg | ORAL_TABLET | Freq: Three times a day (TID) | ORAL | Status: DC | PRN
  Administered 2020-08-26: 25 mg via ORAL
  Filled 2020-08-25: qty 1

## 2020-08-25 MED ORDER — HEPARIN SODIUM (PORCINE) 5000 UNIT/ML IJ SOLN: 5000.0000 [IU] | Freq: Three times a day (TID) | INTRAMUSCULAR | Status: DC

## 2020-08-25 MED ORDER — ACETAMINOPHEN 325 MG PO TABS: 650.0000 mg | ORAL_TABLET | Freq: Four times a day (QID) | ORAL | Status: DC | PRN

## 2020-08-25 MED ORDER — DULOXETINE HCL 20 MG PO CPEP
20.0000 mg | ORAL_CAPSULE | Freq: Every day | ORAL | Status: DC
  Filled 2020-08-25: qty 1

## 2020-08-25 NOTE — ED Notes (Signed)
Report given to Dee RN.
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
## 2020-08-25 NOTE — ED Notes (Signed)
Patient's husband is at bedside.
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
## 2020-08-25 NOTE — ED Notes (Signed)
Walked into room, pt threw sheets at door, 'I need to leave yall arent doing anything for my leg or my pain,' hospitalist text paged to see if can speak to pt

## 2020-08-25 NOTE — Hospital Course (Signed)
Pt came to hospital for chest pain, SOB . Pt states a combination of things both , but has burning in her legs, both at times.  Pt states she has anxiety.

## 2020-08-25 NOTE — ED Notes (Addendum)
Patient placed on bedpan. Patient given warm blanket and cup of ice water.

## 2020-08-25 NOTE — ED Notes (Signed)
Patient voided in bedpan.

## 2020-08-25 NOTE — ED Notes (Signed)
Pt noted to have pulled pants down and urinated on underneath chux pad 'I couldn't hold it anymore.' linens changed and pt cleaned self up. Writer reattached pt to cardiac monitor. Pt reports 'I need my home meds, my oxycodone, my patch and my anxiety med,' informed pt of pharmacy issue with patch and switch to morphine. ciwa performed at 9 at this time. No other needs at this time.

## 2020-08-25 NOTE — ED Notes (Signed)
Text page admission md, pt requesting home oxycodone

## 2020-08-25 NOTE — ED Notes (Signed)
Spoke with pharmacy regarding the buprenorphine patch. Per pharmacy, they do not carry that dosage they only carry the 7.28mcg/hr and 69mcg/hr. Either the pt would have to bring her supply from home or the MD has to change the order. Tana Conch, primary RN made aware.

## 2020-08-26 ENCOUNTER — Other Ambulatory Visit: Payer: Self-pay

## 2020-08-26 ENCOUNTER — Inpatient Hospital Stay (HOSPITAL_COMMUNITY)
Admit: 2020-08-26 | Discharge: 2020-08-26 | Disposition: A | Discharge status: Home / Self Care | Payer: Medicaid Other | Attending: Internal Medicine | Admitting: Internal Medicine

## 2020-08-26 DIAGNOSIS — I472 Ventricular tachycardia: Secondary | ICD-10-CM | POA: Diagnosis not present

## 2020-08-26 DIAGNOSIS — R079 Chest pain, unspecified: Secondary | ICD-10-CM | POA: Diagnosis not present

## 2020-08-26 LAB — COMPREHENSIVE METABOLIC PANEL
ALT: 29 U/L (ref 0–44)
AST: 43 U/L — ABNORMAL HIGH (ref 15–41)
Albumin: 3.4 g/dL — ABNORMAL LOW (ref 3.5–5.0)
Alkaline Phosphatase: 84 U/L (ref 38–126)
Anion gap: 5 (ref 5–15)
BUN: 7 mg/dL (ref 6–20)
CO2: 25 mmol/L (ref 22–32)
Calcium: 8.6 mg/dL — ABNORMAL LOW (ref 8.9–10.3)
Chloride: 110 mmol/L (ref 98–111)
Creatinine, Ser: 0.52 mg/dL (ref 0.44–1.00)
GFR, Estimated: >60 mL/min (ref >60)
Glucose, Bld: 121 mg/dL — ABNORMAL HIGH (ref 70–99)
Potassium: 3.6 mmol/L (ref 3.5–5.1)
Sodium: 140 mmol/L (ref 135–145)
Total Bilirubin: 1.7 mg/dL — ABNORMAL HIGH (ref 0.3–1.2)
Total Protein: 7.2 g/dL (ref 6.5–8.1)

## 2020-08-26 LAB — ECHOCARDIOGRAM COMPLETE
AR max vel: 2.61 cm2
AV Area VTI: 2.56 cm2
AV Area mean vel: 2.62 cm2
AV Mean grad: 5 mmHg
AV Peak grad: 7.8 mmHg
Ao pk vel: 1.4 m/s
Area-P 1/2: 8.43 cm2
S' Lateral: 2.33 cm

## 2020-08-26 LAB — CBC
HCT: 39.4 % (ref 36.0–46.0)
Hemoglobin: 13.6 g/dL (ref 12.0–15.0)
MCH: 30.1 pg (ref 26.0–34.0)
MCHC: 34.5 g/dL (ref 30.0–36.0)
MCV: 87.2 fL (ref 80.0–100.0)
Platelets: 68 10*3/uL — ABNORMAL LOW (ref 150–400)
RBC: 4.52 MIL/uL (ref 3.87–5.11)
RDW: 17 % — ABNORMAL HIGH (ref 11.5–15.5)
WBC: 4.9 10*3/uL (ref 4.0–10.5)
nRBC: 0 % (ref 0.0–0.2)

## 2020-08-26 LAB — HIV ANTIBODY (ROUTINE TESTING W REFLEX): HIV Screen 4th Generation wRfx: NONREACTIVE

## 2020-08-26 LAB — MAGNESIUM: Magnesium: 1.9 mg/dL (ref 1.7–2.4)

## 2020-08-26 LAB — PHOSPHORUS: Phosphorus: 3.5 mg/dL (ref 2.5–4.6)

## 2020-08-26 MED ORDER — ENSURE ENLIVE PO LIQD: 237.0000 mL | Freq: Two times a day (BID) | ORAL | Status: DC

## 2020-08-26 MED ORDER — MIDODRINE HCL 5 MG PO TABS: 5.0000 mg | ORAL_TABLET | Freq: Two times a day (BID) | ORAL | Status: DC | PRN

## 2020-08-26 MED ORDER — OXYCODONE HCL 5 MG PO TABS: 5.0000 mg | ORAL_TABLET | Freq: Four times a day (QID) | ORAL | Status: DC | PRN

## 2020-08-26 MED ORDER — NICOTINE 7 MG/24HR TD PT24: 7.0000 mg | MEDICATED_PATCH | Freq: Every day | TRANSDERMAL | 0 refills | Status: DC

## 2020-08-26 NOTE — ED Notes (Signed)
Patient requesting different pain medication besides morphine. Patient told again this by this RN that MD has already told nightshift they are not ordering different pain medications. Patient verbalized understanding

## 2020-08-26 NOTE — ED Notes (Signed)
Pt noted to be resting quietly with easy unlabored respirations.

## 2020-08-26 NOTE — ED Notes (Signed)
Per Dr Billie Ruddy, pt ok for discharge, no new orders. States nicotine patch was already sent to [facility] and was a home med.  Discussed d/c instructions with pt, pt making comments about "this is what is wrong with the system". This RN asked pt if she had any questions or concerns, offered pt to discuss any concerns with MD, pt refused and stated she was ready to go home.  Pt transported to ride in w/c with cellphone and pair of dirty pants.

## 2020-08-26 NOTE — Discharge Summary (Signed)
Physician Discharge Summary   Rhonda Martin  female DOB: 1975-11-18  YFV:[id number]  PCP: Charlynne Cousins, MD  Admit date: 08/25/2020 Discharge date: 08/26/2020  Admitted From: home Disposition:  home CODE STATUS: Full code  Discharge Instructions    Discharge instructions   Complete by: As directed    Workup in the ED is negative for cardiac issues.  Please follow up with your outpatient cardiologist.  You are taking a lot of sedating medications.  Please follow up with your outpatient prescriber to work on tapering them down. - -      30 Day Unplanned Readmission Risk Score   Flowsheet Row ED from 08/25/2020 in [facility]  30 Day Unplanned Readmission Risk Score (%) 40.85 Filed at 08/26/2020 0801     This score is the patient's risk of an unplanned readmission within 30 days of being discharged (0 -100%). The score is based on dignosis, age, lab data, medications, orders, and past utilization.   Low:  0-14.9   Medium: 15-21.9   High: 22-29.9   Extreme: 30 and above         Hospital Course:  For full details, please see H&P, progress notes, consult notes and ancillary notes.  Briefly,  Rhonda Martin is a 45 y.o. female with past medical history of Alcohol abuse/ Tobacco abuse. Liver cirrhosis, anxiety, COPD, HTN, GERD, IDA who presented to ED with complaints of chest pain and SOB and left leg pain.   Pt states she is anxious and chest pain has been going on since her Covid infection few months ago.   Chest pain, ACS ruled out -troponin is negative/ ekg is sinus tachycardia, with no ACS-related changes.  Echo unremarkable.   -CTA showed no PE.     Chronic pain on chronic opioids Pt was on home Butrans patch and home oxycodone PRN, but also asked and received additional IV morphine on presentation.  Pt became very sleepy because of too much opioids, so IV morphine d/c'ed.  Thrombocytopenia: Attribute to portal hypertension  and splenic sequestration.  Alcohol abuse: Last drink the day prior to presentation.    Tobacco abuse: Nicotine patch.  Anxiety: Pt continued on cymbalta.  COPD: Stable.   Liver cirrhosis; Attribute to alcohol related liver cirrhosis.  Sinus tachycardia: Continued home Toprol.  Hypokalemia: Pt refused replacement.  Hypomagnesemia: Repleted with IV mag.   Discharge Diagnoses:  Principal Problem:   Chest pain Active Problems:   Chronic hepatitis C without hepatic coma (HCC)   Thrombocytopenia (HCC)   Alcohol abuse   Tobacco abuse   Anxiety   COPD (chronic obstructive pulmonary disease) (HCC)   Liver cirrhosis (HCC)    Discharge Instructions:  Allergies as of 08/26/2020      Reactions   Tylenol [acetaminophen] Other (See Comments)   Liver disease      Medication List    STOP taking these medications   hydrocodone-ibuprofen 5-200 MG tablet Commonly known as: VICOPROFEN     TAKE these medications   buprenorphine 5 MCG/HR Ptwk Commonly known as: BUTRANS Place 1 patch onto the skin every Monday.   dicyclomine 20 MG tablet Commonly known as: BENTYL TAKE 1 TABLET (20 MG TOTAL) BY MOUTH 4 (FOUR) TIMES DAILY - BEFORE MEALS AND AT BEDTIME.   DULoxetine 20 MG capsule Commonly known as: Cymbalta Take 1 capsule (20 mg total) by mouth daily.   furosemide 20 MG tablet Commonly known as: LASIX Take 20 mg by mouth daily as  needed for fluid.   gabapentin 600 MG tablet Commonly known as: NEURONTIN Take 1,200 mg by mouth 3 (three) times daily.   hydrOXYzine 25 MG tablet Commonly known as: ATARAX/VISTARIL Take 1-3 tablets (25-75 mg total) by mouth 3 (three) times daily as needed (anxiety.).   metoprolol succinate 50 MG 24 hr tablet Commonly known as: TOPROL-XL TAKE 1 TABLET BY MOUTH DAILY. TAKE WITH OR IMMEDIATELY FOLLOWING A MEAL. What changed: additional instructions   midodrine 5 MG tablet Commonly known as: PROAMATINE Take 1 tablet (5 mg  total) by mouth 2 (two) times daily as needed (hypotension).   mometasone-formoterol 100-5 MCG/ACT Aero Commonly known as: DULERA Inhale 2 puffs into the lungs in the morning and at bedtime.   oxyCODONE 5 MG immediate release tablet Commonly known as: Oxy IR/ROXICODONE Take 1 tablet (5 mg total) by mouth every 6 (six) hours as needed for severe pain. What changed: when to take this   zolpidem 5 MG tablet Commonly known as: AMBIEN TAKE 1 TABLET BY MOUTH AT BEDTIME AS NEEDED FOR SLEEP. What changed: additional instructions        Follow-up Information    Vigg, Avanti, MD. Schedule an appointment as soon as possible for a visit in 1 week(s).   Specialty: Internal Medicine Contact information: [place] [address] [phone]        Nelva Bush, MD .   Specialty: Cardiology Contact information: [address]  [place] [address] [phone]               Allergies  Allergen Reactions  . Tylenol [Acetaminophen] Other (See Comments)    Liver disease     The results of significant diagnostics from this hospitalization (including imaging, microbiology, ancillary and laboratory) are listed below for reference.   Consultations:   Procedures/Studies: DG Shoulder Right  Result Date: 08/19/2020 CLINICAL DATA:  Right shoulder pain after fall. EXAM: RIGHT SHOULDER - 2+ VIEW COMPARISON:  January 12, 2019. FINDINGS: There is no evidence of fracture or dislocation. There appears to be chronic resorption of the distal portion of the right clavicle. Joint spaces are otherwise unremarkable. Soft tissues are unremarkable. IMPRESSION: Probable chronic resorption of the distal portion of the right clavicle. No other abnormality seen in the right shoulder. Electronically Signed   By: Marijo Conception M.D.   On: 08/19/2020 16:05   CT Head Wo Contrast  Result Date: 08/19/2020 CLINICAL DATA:  Falling with head trauma. EXAM: CT HEAD WITHOUT CONTRAST  TECHNIQUE: Contiguous axial images were obtained from the base of the skull through the vertex without intravenous contrast. COMPARISON:  03/05/2020 FINDINGS: Brain: The brain shows a normal appearance without evidence of malformation, atrophy, old or acute small or large vessel infarction, mass lesion, hemorrhage, hydrocephalus or extra-axial collection. Vascular: No hyperdense vessel. No evidence of atherosclerotic calcification. Skull: Normal.  No traumatic finding.  No focal bone lesion. Sinuses/Orbits: Sinuses are clear. Orbits appear normal. Mastoids are clear. Other: None significant IMPRESSION: Normal head CT. Electronically Signed   By: Nelson Chimes M.D.   On: 08/19/2020 17:02   CT Angio Chest PE W/Cm &/Or Wo Cm  Result Date: 08/25/2020 CLINICAL DATA:  Shortness of breath.  COVID positive 3 months ago. EXAM: CT ANGIOGRAPHY CHEST WITH CONTRAST TECHNIQUE: Multidetector CT imaging of the chest was performed using the standard protocol during bolus administration of intravenous contrast. Multiplanar CT image reconstructions and MIPs were obtained to evaluate the vascular anatomy. CONTRAST:  78mL OMNIPAQUE IOHEXOL  350 MG/ML SOLN COMPARISON:  August 19, 2020 FINDINGS: Cardiovascular: Satisfactory opacification of the pulmonary arteries to the segmental level. No evidence of pulmonary embolism. Normal heart size. No pericardial effusion. Mediastinum/Nodes: No enlarged mediastinal, hilar, or axillary lymph nodes. Thyroid gland, trachea, and esophagus demonstrate no significant findings. Lungs/Pleura: Mild dependent atelectasis of posterior lung bases are noted. Emphysematous changes of the lungs are identified. No focal pneumonia or pleural effusion is noted. Upper Abdomen: There is splenomegaly. There is diffuse low density of the liver with nodular contour of the liver. Musculoskeletal: Chronic compression deformity of T11 is unchanged. Degenerative joint changes of spine are noted. Review of the MIP images  confirms the above findings. IMPRESSION: 1. No pulmonary embolus. 2. Mild dependent atelectasis of posterior lung bases. 3. Emphysematous changes of the lungs. 4. Findings suggesting cirrhosis and portal hypertension. 5. Chronic compression deformity of T11. Emphysema (ICD10-J43.9). Electronically Signed   By: Abelardo Diesel M.D.   On: 08/25/2020 11:51   CT Angio Chest PE W and/or Wo Contrast  Result Date: 08/19/2020 CLINICAL DATA:  Multiple falls with shortness of breath. EXAM: CT ANGIOGRAPHY CHEST WITH CONTRAST TECHNIQUE: Multidetector CT imaging of the chest was performed using the standard protocol during bolus administration of intravenous contrast. Multiplanar CT image reconstructions and MIPs were obtained to evaluate the vascular anatomy. CONTRAST:  27mL OMNIPAQUE IOHEXOL 350 MG/ML SOLN COMPARISON:  March 05, 2020 FINDINGS: Cardiovascular: The thoracic aorta is normal in appearance. Satisfactory opacification of the pulmonary arteries to the segmental level. No evidence of pulmonary embolism. Normal heart size. No pericardial effusion. Mediastinum/Nodes: No enlarged mediastinal, hilar, or axillary lymph nodes. Thyroid gland, trachea, and esophagus demonstrate no significant findings. Lungs/Pleura: Mild to moderate severity emphysematous lung disease is seen. Very mild atelectasis is seen along the posterior aspect of both lungs, without evidence of acute infiltrate, pleural effusion or pneumothorax. Upper Abdomen: Stable moderate to marked severity splenomegaly is seen. Musculoskeletal: Degenerative changes seen throughout the thoracic spine with a chronic compression fracture deformity is seen at the level of T11. Review of the MIP images confirms the above findings. IMPRESSION: 1. No evidence of pulmonary embolism or acute cardiopulmonary disease. 2. Splenomegaly. 3. Chronic compression fracture deformity at the level of T11. 4. Emphysema. Emphysema (ICD10-J43.9). Electronically Signed   By:  Virgina Norfolk M.D.   On: 08/19/2020 17:01   CT Cervical Spine Wo Contrast  Result Date: 08/19/2020 CLINICAL DATA:  Falling with trauma to the head and neck. EXAM: CT CERVICAL SPINE WITHOUT CONTRAST TECHNIQUE: Multidetector CT imaging of the cervical spine was performed without intravenous contrast. Multiplanar CT image reconstructions were also generated. COMPARISON:  None. FINDINGS: Alignment: Degenerative anterolisthesis at C3-4 of 3 mm due to facet arthropathy on the right. Mild scoliotic curvature in the cervical region convex to the left. Skull base and vertebrae: No regional fracture or primary bone lesion. Soft tissues and spinal canal: No traumatic soft tissue finding. Disc levels: Foramen magnum sufficiently patent. Ordinary osteoarthritis at the C1-2 articulation but without neural compression. C2-3: Facet osteoarthritis on the right with mild right foraminal narrowing. C3-4: Advanced facet osteoarthritis on the right with 3 mm of anterolisthesis. Moderate right foraminal stenosis. C4-5: Mild bilateral uncovertebral prominence. No compressive stenosis. C5-6: Mild bulging of the disc.  No stenosis. C6-7: Normal interspace. C7-T1: Minimal facet osteoarthritis. No canal or foraminal stenosis. Upper chest: Emphysema and pulmonary scarring. Other: None IMPRESSION: No acute or traumatic finding. Advanced facet osteoarthritis on the right at C3-4 with 3 mm of anterolisthesis. Moderate  right foraminal stenosis. Lesser right-sided facet arthropathy at L2-3 with some right foraminal narrowing at that level as well. Emphysema. Electronically Signed   By: Nelson Chimes M.D.   On: 08/19/2020 17:05   CT ABDOMEN PELVIS W CONTRAST  Result Date: 08/19/2020 CLINICAL DATA:  Multiple falls.  Abdominal pain. EXAM: CT ABDOMEN AND PELVIS WITH CONTRAST TECHNIQUE: Multidetector CT imaging of the abdomen and pelvis was performed using the standard protocol following bolus administration of intravenous contrast. CONTRAST:   64mL OMNIPAQUE IOHEXOL 350 MG/ML SOLN COMPARISON:  11/23/2019 FINDINGS: Lower chest: Mild dependent subpleural atelectasis/edema but no infiltrates or effusions. The heart is within normal limits in size and stable. No pericardial effusion. Hepatobiliary: Stable cirrhotic changes involving the liver with portal venous hypertension and portal venous collaterals. No obvious hepatic lesions or intrahepatic biliary dilatation. Gallbladder contains a few small calcified gallstones. No common bile duct dilatation. Pancreas: No mass, inflammation or ductal dilatation. Spleen: Stable in a megaly. Adrenals/Urinary Tract: The adrenal glands and kidneys are unremarkable. The bladder is unremarkable. Stomach/Bowel: Stomach, duodenum, small bowel and colon are grossly normal. No acute inflammatory changes, mass lesions or obstructive findings. Vascular/Lymphatic: Stable age advanced atherosclerotic calcifications involving the aorta and iliac arteries. No aneurysm or dissection. Reproductive: The uterus and ovaries are unremarkable. An IUD is noted in the endometrial canal. Other: No ascites or abdominal wall hernia. Musculoskeletal: The bony structures are intact. Remote lower thoracic and lumbar compression fractures are noted. No acute bony findings. IMPRESSION: 1. Stable cirrhotic changes involving the liver with portal venous hypertension, portal venous collaterals and splenomegaly. 2. No acute abdominal/pelvic findings, mass lesions or adenopathy. 3. Cholelithiasis. 4. Stable age advanced atherosclerotic calcifications involving the aorta and iliac arteries. 5. Remote lower thoracic and lumbar compression fractures. 6. Aortic atherosclerosis. Aortic Atherosclerosis (ICD10-I70.0). Electronically Signed   By: Marijo Sanes M.D.   On: 08/19/2020 17:01   CT L-SPINE NO CHARGE  Result Date: 08/19/2020 CLINICAL DATA:  Multiple falls since leg amputation. EXAM: CT LUMBAR SPINE WITHOUT CONTRAST TECHNIQUE: Multidetector CT  imaging of the lumbar spine was performed without intravenous contrast administration. Multiplanar CT image reconstructions were also generated. COMPARISON:  CT chest 03/05/2020.  CT abdomen 11/23/2019. FINDINGS: Segmentation: 5 lumbar type vertebral bodies. Alignment: Increased kyphotic curvature at the thoracolumbar junction as seen previously. Vertebrae: Old compression deformity at L1 with loss of height of 60%. Evidence of previous spinal fusion from T12-L2, with hardware removal and screw shadows at those levels. No change in the appearance since June of last year. Old superior endplate depression at L3, also unchanged since last year. No new regional finding. Paraspinal and other soft tissues: Negative Disc levels: No significant disc space pathology from T11-12 through L3-4. Previous posterior decompression at T12-L1. L4-5: Mild bulging of the disc. Mild facet and ligamentous hypertrophy. Mild narrowing of the lateral recesses and foramina without definite neural compression. L5-S1: No disc pathology. Mild facet osteoarthritis. No canal or foraminal stenosis. IMPRESSION: 1. No change since June of last year. Old compression fractures at L1 and L3 with loss of height of 60% at L1. These are healed and unchanged. Previous spinal fusion from T12-L2 with hardware removal and screw shadows at those levels. No change in the appearance since June of last year. 2. Mild degenerative changes at L4-5 and L5-S1 without apparent neural compression. Electronically Signed   By: Nelson Chimes M.D.   On: 08/19/2020 17:02   DG Chest Portable 1 View  Result Date: 08/25/2020 CLINICAL DATA:  Shortness of  breath EXAM: PORTABLE CHEST 1 VIEW COMPARISON:  August 19, 2020 FINDINGS: The heart size and mediastinal contours are within normal limits. Both lungs are clear. The visualized skeletal structures are unremarkable. IMPRESSION: No active disease. Electronically Signed   By: Abelardo Diesel M.D.   On: 08/25/2020 09:25   DG Chest  Port 1 View  Result Date: 08/19/2020 CLINICAL DATA:  Shortness of breath. EXAM: PORTABLE CHEST 1 VIEW COMPARISON:  April 02, 2020. FINDINGS: The heart size and mediastinal contours are within normal limits. Both lungs are clear. No pneumothorax or pleural effusion is noted. The visualized skeletal structures are unremarkable. IMPRESSION: No active disease. Electronically Signed   By: Marijo Conception M.D.   On: 08/19/2020 16:03   VAS [place] LOWER EXTREMITY ARTERIAL DUPLEX  Result Date: 08/09/2020 LOWER EXTREMITY ARTERIAL DUPLEX STUDY  Current ABI: N/A Performing Technologist: Charlane Ferretti RT (R)(VS)  Examination Guidelines: A complete evaluation includes B-mode imaging, spectral Doppler, color Doppler, and power Doppler as needed of all accessible portions of each vessel. Bilateral testing is considered an integral part of a complete examination. Limited examinations for reoccurring indications may be performed as noted.  +----------+--------+-----+--------+----------+--------+ RIGHT     PSV cm/sRatioStenosisWaveform  Comments +----------+--------+-----+--------+----------+--------+ DFA       80                   monophasic         +----------+--------+-----+--------+----------+--------+ SFA Prox  82                   monophasic         +----------+--------+-----+--------+----------+--------+ SFA Mid   85                   monophasic         +----------+--------+-----+--------+----------+--------+ SFA Distal49                   monophasicAKA      +----------+--------+-----+--------+----------+--------+  Summary: Right: Near normal examination. Patent right lower arterial extremity from CFA to mid/distal. See table(s) above for measurements and observations. Electronically signed by Leotis Pain MD on 08/09/2020 at 9:36:46 AM.    Final       Labs: BNP (last 3 results) Recent Labs    12/10/19 1305  BNP 49.7   Basic Metabolic Panel: Recent Labs  Lab 08/19/20 1511  08/25/20 0912 08/25/20 1602 08/26/20 0715  NA 136 138 138 140  K 3.9 3.3* 3.2* 3.6  CL 108 108 108 110  CO2 21* 20* 21* 25  GLUCOSE 106* 115* 111* 121*  BUN 12 <5* <5* 7  CREATININE 0.41* 0.44 0.34* 0.52  CALCIUM 8.3* 8.9 8.5* 8.6*  MG  --  1.8 2.1 1.9  PHOS  --   --  2.9 3.5   Liver Function Tests: Recent Labs  Lab 08/19/20 1511 08/25/20 0912 08/25/20 1602 08/26/20 0715  AST 67* 52* 42* 43*  ALT 36 34 32 29  ALKPHOS 90 86 75 84  BILITOT 0.8 1.8* 2.0* 1.7*  PROT 7.0 7.7 7.0 7.2  ALBUMIN 3.4* 3.8 3.5 3.4*   No results for input(s): LIPASE, AMYLASE in the last 168 hours. Recent Labs  Lab 08/25/20 0912  AMMONIA 41*   CBC: Recent Labs  Lab 08/19/20 1511 08/25/20 0912 08/25/20 1602 08/26/20 0715  WBC 6.4 4.3 3.3* 4.9  NEUTROABS  --  3.0  --   --   HGB 13.2 14.0 13.4 13.6  HCT 39.1 39.9 36.9 39.4  MCV 88.3 85.1 85.2 87.2  PLT 58* 68* 61* 68*   Cardiac Enzymes: No results for input(s): CKTOTAL, CKMB, CKMBINDEX, TROPONINI in the last 168 hours. BNP: Invalid input(s): POCBNP CBG: No results for input(s): GLUCAP in the last 168 hours. D-Dimer No results for input(s): DDIMER in the last 72 hours. Hgb A1c No results for input(s): HGBA1C in the last 72 hours. Lipid Profile No results for input(s): CHOL, HDL, LDLCALC, TRIG, CHOLHDL, LDLDIRECT in the last 72 hours. Thyroid function studies No results for input(s): TSH, T4TOTAL, T3FREE, THYROIDAB in the last 72 hours.  Invalid input(s): FREET3 Anemia work up No results for input(s): VITAMINB12, FOLATE, FERRITIN, TIBC, IRON, RETICCTPCT in the last 72 hours. Urinalysis    Component Value Date/Time   COLORURINE YELLOW (A) 08/25/2020 0912   APPEARANCEUR HAZY (A) 08/25/2020 0912   APPEARANCEUR Clear 05/13/2020 1547   LABSPEC 1.029 08/25/2020 0912   PHURINE 7.0 08/25/2020 0912   GLUCOSEU NEGATIVE 08/25/2020 0912   HGBUR NEGATIVE 08/25/2020 0912   BILIRUBINUR NEGATIVE 08/25/2020 0912   BILIRUBINUR Negative  05/13/2020 1547   KETONESUR NEGATIVE 08/25/2020 0912   PROTEINUR NEGATIVE 08/25/2020 0912   NITRITE NEGATIVE 08/25/2020 0912   LEUKOCYTESUR TRACE (A) 08/25/2020 0912   Sepsis Labs Invalid input(s): PROCALCITONIN,  WBC,  LACTICIDVEN Microbiology Recent Results (from the past 240 hour(s))  SARS CORONAVIRUS 2 (TAT 6-24 HRS) Nasopharyngeal Nasopharyngeal Swab     Status: None   Collection Time: 08/25/20 12:49 PM   Specimen: Nasopharyngeal Swab  Result Value Ref Range Status   SARS Coronavirus 2 NEGATIVE NEGATIVE Final    Comment: (NOTE) SARS-CoV-2 target nucleic acids are NOT DETECTED.  The SARS-CoV-2 RNA is generally detectable in upper and lower respiratory specimens during the acute phase of infection. Negative results do not preclude SARS-CoV-2 infection, do not rule out co-infections with other pathogens, and should not be used as the sole basis for treatment or other patient management decisions. Negative results must be combined with clinical observations, patient history, and epidemiological information. The expected result is Negative.  Fact Sheet for Patients: SugarRoll.be  Fact Sheet for Healthcare Providers: https://www.woods-mathews.com/  This test is not yet approved or cleared by the [place] FDA and  has been authorized for detection and/or diagnosis of SARS-CoV-2 by FDA under an Emergency Use Authorization (EUA). This EUA will remain  in effect (meaning this test can be used) for the duration of the COVID-19 declaration under Se ction 564(b)(1) of the Act, 21 U.S.C. section 360bbb-3(b)(1), unless the authorization is terminated or revoked sooner.  Performed at [facility], [place] [address] [place], [place] [address]      Total time spend on discharging this patient, including the last patient exam, discussing the hospital stay, instructions for ongoing care as it relates to all pertinent caregivers, as well  as preparing the medical discharge records, prescriptions, and/or referrals as applicable, is 35 minutes.    Enzo Bi, MD  [facility] 08/26/2020, 12:20 PM

## 2020-08-26 NOTE — ED Notes (Signed)
This RN called spouse Nathaneil Canary to come pick up for discharge, states he will be here within the hour. Pt resting comfortably at this time

## 2020-08-26 NOTE — Progress Notes (Incomplete)
*  PRELIMINARY RESULTS* Echocardiogram 2D Echocardiogram has been performed.  Sherrie Sport 08/26/2020, 10:09 AM

## 2020-08-26 NOTE — ED Notes (Signed)
Pt requested paper, pen, walker. provided

## 2020-08-26 NOTE — ED Notes (Addendum)
Patient given coffee and ice per request. Patient also given pen and napkin to write on per request. Patient made aware this RN will trial out giving coffee and will continue if patient is respectful and does not throw items at nursing staff. Per night nurse patient was throwing items in floor on purpose and at RN.

## 2020-08-26 NOTE — ED Notes (Signed)
Patient given ice per request

## 2020-08-28 NOTE — Telephone Encounter (Signed)
I attempted to contact the patient to schedule a RLE angio and the patient doesn't have voicemail set up, I was unable to leave a message.

## 2020-08-29 ENCOUNTER — Ambulatory Visit: Payer: Medicaid Other | Admitting: Internal Medicine

## 2020-09-04 DIAGNOSIS — R079 Chest pain, unspecified: Secondary | ICD-10-CM | POA: Diagnosis not present

## 2020-09-05 DIAGNOSIS — F17200 Nicotine dependence, unspecified, uncomplicated: Secondary | ICD-10-CM | POA: Insufficient documentation

## 2020-09-09 DIAGNOSIS — G894 Chronic pain syndrome: Secondary | ICD-10-CM | POA: Insufficient documentation

## 2020-09-09 DIAGNOSIS — R9431 Abnormal electrocardiogram [ECG] [EKG]: Secondary | ICD-10-CM | POA: Insufficient documentation

## 2020-09-10 ENCOUNTER — Telehealth (INDEPENDENT_AMBULATORY_CARE_PROVIDER_SITE_OTHER): Payer: Self-pay

## 2020-09-10 NOTE — Telephone Encounter (Signed)
I attempted to contact the patient for the 3rd time to schedule her for a RLE angio with Dr. Lucky Cowboy. I am unable to leave a message as she has a voicemail box that is not set up.

## 2020-09-12 DIAGNOSIS — R0789 Other chest pain: Secondary | ICD-10-CM | POA: Diagnosis not present

## 2020-09-15 DIAGNOSIS — R4182 Altered mental status, unspecified: Secondary | ICD-10-CM | POA: Diagnosis not present

## 2020-09-23 DIAGNOSIS — T8789 Other complications of amputation stump: Secondary | ICD-10-CM | POA: Diagnosis not present

## 2020-09-23 DIAGNOSIS — M79651 Pain in right thigh: Secondary | ICD-10-CM | POA: Diagnosis not present

## 2020-10-01 DIAGNOSIS — R410 Disorientation, unspecified: Secondary | ICD-10-CM | POA: Diagnosis not present

## 2020-10-03 DIAGNOSIS — F1099 Alcohol use, unspecified with unspecified alcohol-induced disorder: Secondary | ICD-10-CM | POA: Diagnosis not present

## 2020-10-03 DIAGNOSIS — D696 Thrombocytopenia, unspecified: Secondary | ICD-10-CM | POA: Diagnosis not present

## 2020-10-03 DIAGNOSIS — R410 Disorientation, unspecified: Secondary | ICD-10-CM | POA: Diagnosis not present

## 2020-10-03 DIAGNOSIS — I10 Essential (primary) hypertension: Secondary | ICD-10-CM | POA: Diagnosis not present

## 2020-10-03 DIAGNOSIS — K7469 Other cirrhosis of liver: Secondary | ICD-10-CM | POA: Diagnosis not present

## 2020-10-03 DIAGNOSIS — D72819 Decreased white blood cell count, unspecified: Secondary | ICD-10-CM | POA: Diagnosis not present

## 2020-10-04 DIAGNOSIS — K7469 Other cirrhosis of liver: Secondary | ICD-10-CM | POA: Diagnosis not present

## 2020-10-04 DIAGNOSIS — F1099 Alcohol use, unspecified with unspecified alcohol-induced disorder: Secondary | ICD-10-CM | POA: Diagnosis not present

## 2020-10-04 DIAGNOSIS — D72819 Decreased white blood cell count, unspecified: Secondary | ICD-10-CM | POA: Diagnosis not present

## 2020-10-04 DIAGNOSIS — I10 Essential (primary) hypertension: Secondary | ICD-10-CM | POA: Diagnosis not present

## 2020-10-04 DIAGNOSIS — R410 Disorientation, unspecified: Secondary | ICD-10-CM | POA: Diagnosis not present

## 2020-10-04 DIAGNOSIS — D696 Thrombocytopenia, unspecified: Secondary | ICD-10-CM | POA: Diagnosis not present

## 2020-10-05 DIAGNOSIS — D72819 Decreased white blood cell count, unspecified: Secondary | ICD-10-CM | POA: Diagnosis not present

## 2020-10-05 DIAGNOSIS — F1099 Alcohol use, unspecified with unspecified alcohol-induced disorder: Secondary | ICD-10-CM | POA: Diagnosis not present

## 2020-10-05 DIAGNOSIS — D696 Thrombocytopenia, unspecified: Secondary | ICD-10-CM | POA: Diagnosis not present

## 2020-10-05 DIAGNOSIS — R410 Disorientation, unspecified: Secondary | ICD-10-CM | POA: Diagnosis not present

## 2020-10-05 DIAGNOSIS — I10 Essential (primary) hypertension: Secondary | ICD-10-CM | POA: Diagnosis not present

## 2020-10-05 DIAGNOSIS — K7469 Other cirrhosis of liver: Secondary | ICD-10-CM | POA: Diagnosis not present

## 2020-10-06 DIAGNOSIS — K7469 Other cirrhosis of liver: Secondary | ICD-10-CM | POA: Diagnosis not present

## 2020-10-06 DIAGNOSIS — R410 Disorientation, unspecified: Secondary | ICD-10-CM | POA: Diagnosis not present

## 2020-10-06 DIAGNOSIS — D696 Thrombocytopenia, unspecified: Secondary | ICD-10-CM | POA: Diagnosis not present

## 2020-10-06 DIAGNOSIS — D72819 Decreased white blood cell count, unspecified: Secondary | ICD-10-CM | POA: Diagnosis not present

## 2020-10-06 DIAGNOSIS — I10 Essential (primary) hypertension: Secondary | ICD-10-CM | POA: Diagnosis not present

## 2020-10-06 DIAGNOSIS — F1099 Alcohol use, unspecified with unspecified alcohol-induced disorder: Secondary | ICD-10-CM | POA: Diagnosis not present

## 2020-10-07 DIAGNOSIS — I10 Essential (primary) hypertension: Secondary | ICD-10-CM | POA: Diagnosis not present

## 2020-10-07 DIAGNOSIS — K7469 Other cirrhosis of liver: Secondary | ICD-10-CM | POA: Diagnosis not present

## 2020-10-07 DIAGNOSIS — D72819 Decreased white blood cell count, unspecified: Secondary | ICD-10-CM | POA: Diagnosis not present

## 2020-10-07 DIAGNOSIS — D696 Thrombocytopenia, unspecified: Secondary | ICD-10-CM | POA: Diagnosis not present

## 2020-10-07 DIAGNOSIS — F1099 Alcohol use, unspecified with unspecified alcohol-induced disorder: Secondary | ICD-10-CM | POA: Diagnosis not present

## 2020-10-07 DIAGNOSIS — R410 Disorientation, unspecified: Secondary | ICD-10-CM | POA: Diagnosis not present

## 2020-10-08 DIAGNOSIS — F1099 Alcohol use, unspecified with unspecified alcohol-induced disorder: Secondary | ICD-10-CM | POA: Diagnosis not present

## 2020-10-08 DIAGNOSIS — F1990 Other psychoactive substance use, unspecified, uncomplicated: Secondary | ICD-10-CM | POA: Insufficient documentation

## 2020-10-08 DIAGNOSIS — K7030 Alcoholic cirrhosis of liver without ascites: Secondary | ICD-10-CM | POA: Diagnosis not present

## 2020-10-08 DIAGNOSIS — G894 Chronic pain syndrome: Secondary | ICD-10-CM | POA: Diagnosis not present

## 2021-01-28 DIAGNOSIS — Z7689 Persons encountering health services in other specified circumstances: Secondary | ICD-10-CM | POA: Diagnosis not present

## 2021-02-04 DIAGNOSIS — Z1331 Encounter for screening for depression: Secondary | ICD-10-CM | POA: Diagnosis not present

## 2021-02-04 DIAGNOSIS — Z113 Encounter for screening for infections with a predominantly sexual mode of transmission: Secondary | ICD-10-CM | POA: Diagnosis not present

## 2021-02-04 DIAGNOSIS — Z1239 Encounter for other screening for malignant neoplasm of breast: Secondary | ICD-10-CM | POA: Diagnosis not present

## 2021-02-04 DIAGNOSIS — Z7689 Persons encountering health services in other specified circumstances: Secondary | ICD-10-CM | POA: Diagnosis not present

## 2021-02-04 DIAGNOSIS — Z118 Encounter for screening for other infectious and parasitic diseases: Secondary | ICD-10-CM | POA: Diagnosis not present

## 2021-02-04 DIAGNOSIS — Z124 Encounter for screening for malignant neoplasm of cervix: Secondary | ICD-10-CM | POA: Diagnosis not present

## 2021-02-04 DIAGNOSIS — Z01419 Encounter for gynecological examination (general) (routine) without abnormal findings: Secondary | ICD-10-CM | POA: Diagnosis not present

## 2021-02-04 DIAGNOSIS — E559 Vitamin D deficiency, unspecified: Secondary | ICD-10-CM | POA: Diagnosis not present

## 2021-02-04 DIAGNOSIS — Z3009 Encounter for other general counseling and advice on contraception: Secondary | ICD-10-CM | POA: Diagnosis not present

## 2021-02-04 DIAGNOSIS — Z114 Encounter for screening for human immunodeficiency virus [HIV]: Secondary | ICD-10-CM | POA: Diagnosis not present

## 2021-02-04 DIAGNOSIS — Z0001 Encounter for general adult medical examination with abnormal findings: Secondary | ICD-10-CM | POA: Diagnosis not present

## 2021-02-11 DIAGNOSIS — Z7689 Persons encountering health services in other specified circumstances: Secondary | ICD-10-CM | POA: Diagnosis not present

## 2021-02-11 DIAGNOSIS — M1380 Other specified arthritis, unspecified site: Secondary | ICD-10-CM | POA: Diagnosis not present

## 2021-02-11 DIAGNOSIS — G894 Chronic pain syndrome: Secondary | ICD-10-CM | POA: Diagnosis not present

## 2021-02-11 DIAGNOSIS — Z89611 Acquired absence of right leg above knee: Secondary | ICD-10-CM | POA: Diagnosis not present

## 2021-02-12 DIAGNOSIS — Z7689 Persons encountering health services in other specified circumstances: Secondary | ICD-10-CM | POA: Diagnosis not present

## 2021-03-10 DIAGNOSIS — Z7689 Persons encountering health services in other specified circumstances: Secondary | ICD-10-CM | POA: Diagnosis not present

## 2021-03-10 DIAGNOSIS — Z89611 Acquired absence of right leg above knee: Secondary | ICD-10-CM | POA: Diagnosis not present

## 2021-03-10 DIAGNOSIS — G8929 Other chronic pain: Secondary | ICD-10-CM | POA: Diagnosis not present

## 2021-03-10 DIAGNOSIS — D518 Other vitamin B12 deficiency anemias: Secondary | ICD-10-CM | POA: Diagnosis not present

## 2021-03-10 DIAGNOSIS — R5383 Other fatigue: Secondary | ICD-10-CM | POA: Diagnosis not present

## 2021-03-12 DIAGNOSIS — S78111A Complete traumatic amputation at level between right hip and knee, initial encounter: Secondary | ICD-10-CM | POA: Diagnosis not present

## 2021-03-12 DIAGNOSIS — Z7689 Persons encountering health services in other specified circumstances: Secondary | ICD-10-CM | POA: Diagnosis not present

## 2021-03-24 DIAGNOSIS — Z79899 Other long term (current) drug therapy: Secondary | ICD-10-CM | POA: Diagnosis not present

## 2021-03-25 ENCOUNTER — Other Ambulatory Visit: Payer: Self-pay

## 2021-03-25 ENCOUNTER — Ambulatory Visit (INDEPENDENT_AMBULATORY_CARE_PROVIDER_SITE_OTHER): Payer: Medicaid Other | Admitting: Internal Medicine

## 2021-03-25 ENCOUNTER — Encounter: Payer: Self-pay | Admitting: Internal Medicine

## 2021-03-25 VITALS — BP 121/78 | HR 81 | Temp 98.6°F | Ht 64.96 in | Wt 157.4 lb

## 2021-03-25 DIAGNOSIS — R52 Pain, unspecified: Secondary | ICD-10-CM

## 2021-03-25 DIAGNOSIS — R7989 Other specified abnormal findings of blood chemistry: Secondary | ICD-10-CM | POA: Diagnosis not present

## 2021-03-25 DIAGNOSIS — K625 Hemorrhage of anus and rectum: Secondary | ICD-10-CM | POA: Insufficient documentation

## 2021-03-25 DIAGNOSIS — R768 Other specified abnormal immunological findings in serum: Secondary | ICD-10-CM | POA: Diagnosis not present

## 2021-03-25 DIAGNOSIS — K921 Melena: Secondary | ICD-10-CM | POA: Diagnosis not present

## 2021-03-25 MED ORDER — TRAMADOL HCL 50 MG PO TABS: 50.0000 mg | ORAL_TABLET | Freq: Two times a day (BID) | ORAL | 0 refills | Status: AC | PRN

## 2021-03-25 NOTE — Progress Notes (Signed)
BP 121/78   Pulse 81   Temp 98.6 F (37 C) (Oral)   Ht 5' 4.96" (1.65 m)   Wt 157 lb 6.4 oz (71.4 kg)   SpO2 96%   BMI 26.22 kg/m    Subjective:    [name], female    DOB: 04-02-1976, 45 y.o.   MRN: [id number]  Chief Complaint  Patient presents with   Referral    Patient was a new referral for pain management.     GI Problem    Would like something to take for the excess acid in her stomach.     HPI: [name] is a 45 y.o. female  Pt says she has had blood in stools sec to ibuprofen and if she has tomatoes. Took ibuprofen 800 mg and had a bloody stool . 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  Sees vascular surgery was going to [facility] for prosthetic. Says she feels its cutting into her skin. Hasnt contacted the people who supplied this to her .  Pt says she moved [place] in end of march and moved back this week  Did establish with PCP - was seen by prosthetic doctors as well.  Was seen at the hospital @ [place] around April was hospitalized x 11/ 2 months for anxiety, hasnt been seeing a psychiatrist now  Says she got off of her meds.  Was suffering from depression and anxiety.  She says she was with her son on a third story apt , was riding to the grocery store and was cleaning and cooking, all the physical activity helped her a lot.  Says she feels stronger now.   Is off of all meds now.     GI Problem The primary symptoms include melena. Primary symptoms do not include fever, weight loss, fatigue, abdominal pain, nausea, vomiting, diarrhea, hematemesis, jaundice, hematochezia, dysuria, myalgias or arthralgias. Primary symptoms comment: pt has had BRBPR as well last week. as veen using ibubrufen per her verbal record has a ho IVDA.   Chief Complaint  Patient presents with   Referral    Patient was a new referral for pain management.     GI Problem    Would like something to take for the excess acid in her stomach.     Relevant past  medical, surgical, family and social history reviewed and updated as indicated. Interim medical history since our last visit reviewed. Allergies and medications reviewed and updated.  Review of Systems  Constitutional:  Negative for fatigue, fever and weight loss.  Gastrointestinal:  Positive for melena. Negative for abdominal pain, diarrhea, hematemesis, hematochezia, jaundice, nausea and vomiting.  Genitourinary:  Negative for dysuria.  Musculoskeletal:  Negative for arthralgias and myalgias.   Per HPI unless specifically indicated above     Objective:    BP 121/78   Pulse 81   Temp 98.6 F (37 C) (Oral)   Ht 5' 4.96" (1.65 m)   Wt 157 lb 6.4 oz (71.4 kg)   SpO2 96%   BMI 26.22 kg/m   Wt Readings from Last 3 Encounters:  03/25/21 157 lb 6.4 oz (71.4 kg)  08/19/20 135 lb (61.2 kg)  08/01/20 137 lb (62.1 kg)    Physical Exam Vitals and nursing note reviewed.  Constitutional:      General: She is not in acute distress.    Appearance: Normal appearance. She is not ill-appearing or diaphoretic.  Eyes:     Conjunctiva/sclera: Conjunctivae  normal.  Cardiovascular:     Rate and Rhythm: Normal rate and regular rhythm.     Heart sounds: No murmur heard. Pulmonary:     Effort: No respiratory distress.     Breath sounds: No stridor. No wheezing or rhonchi.  Abdominal:     General: Abdomen is flat. Bowel sounds are normal. There is no distension.     Palpations: Abdomen is soft. There is no mass.     Tenderness: There is no abdominal tenderness. There is no guarding.  Musculoskeletal:        General: Tenderness and deformity present. No swelling or signs of injury.     Cervical back: No rigidity.     Right lower leg: No edema.     Left lower leg: No edema.     Comments: Right AKA   Skin:    General: Skin is warm and dry.     Coloration: Skin is not jaundiced.     Findings: No erythema.  Neurological:     Mental Status: She is alert.     Cranial Nerves: No cranial nerve  deficit.     Gait: Gait abnormal.  Psychiatric:        Mood and Affect: Mood normal.        Behavior: Behavior normal.        Judgment: Judgment normal.    Results for orders placed or performed in visit on 03/25/21  CBC With Differential/Platelet  Result Value Ref Range   WBC 4.4 3.4 - 10.8 x10E3/uL   RBC 4.63 3.77 - 5.28 x10E6/uL   Hemoglobin 15.4 11.1 - 15.9 g/dL   Hematocrit 42.7 34.0 - 46.6 %   MCV 92 79 - 97 fL   MCH 33.3 (H) 26.6 - 33.0 pg   MCHC 36.1 (H) 31.5 - 35.7 g/dL   RDW 14.5 11.7 - 15.4 %   Platelets 74 (LL) 150 - 450 x10E3/uL   Neutrophils 65 Not Estab. %   Lymphs 26 Not Estab. %   MID 8 Not Estab. %   Neutrophils Absolute 2.8 1.4 - 7.0 x10E3/uL   Lymphocytes Absolute 1.2 0.7 - 3.1 x10E3/uL   MID (Absolute) 0.4 0.1 - 1.6 X10E3/uL  CBC with Differential/Platelet  Result Value Ref Range   WBC 4.4 3.4 - 10.8 x10E3/uL   RBC 4.63 3.77 - 5.28 x10E6/uL   Hemoglobin 15.0 11.1 - 15.9 g/dL   Hematocrit 42.4 34.0 - 46.6 %   MCV 92 79 - 97 fL   MCH 32.4 26.6 - 33.0 pg   MCHC 35.4 31.5 - 35.7 g/dL   RDW 12.1 11.7 - 15.4 %   Platelets 79 (LL) 150 - 450 x10E3/uL   Neutrophils 62 Not Estab. %   Lymphs 26 Not Estab. %   Monocytes 9 Not Estab. %   Eos 2 Not Estab. %   Basos 1 Not Estab. %   Neutrophils Absolute 2.7 1.4 - 7.0 x10E3/uL   Lymphocytes Absolute 1.1 0.7 - 3.1 x10E3/uL   Monocytes Absolute 0.4 0.1 - 0.9 x10E3/uL   EOS (ABSOLUTE) 0.1 0.0 - 0.4 x10E3/uL   Basophils Absolute 0.0 0.0 - 0.2 x10E3/uL   Immature Granulocytes 0 Not Estab. %   Immature Grans (Abs) 0.0 0.0 - 0.1 x10E3/uL   Hematology Comments: Note:   Comprehensive metabolic panel  Result Value Ref Range   Glucose 96 70 - 99 mg/dL   BUN 11 6 - 24 mg/dL   Creatinine, Ser 0.85 0.57 - 1.00 mg/dL  eGFR 86 >59 mL/min/1.73   BUN/Creatinine Ratio 13 9 - 23   Sodium 137 134 - 144 mmol/L   Potassium 4.0 3.5 - 5.2 mmol/L   Chloride 104 96 - 106 mmol/L   CO2 19 (L) 20 - 29 mmol/L   Calcium 8.9 8.7 -  10.2 mg/dL   Total Protein 7.0 6.0 - 8.5 g/dL   Albumin 4.0 3.8 - 4.8 g/dL   Globulin, Total 3.0 1.5 - 4.5 g/dL   Albumin/Globulin Ratio 1.3 1.2 - 2.2   Bilirubin Total 0.5 0.0 - 1.2 mg/dL   Alkaline Phosphatase 90 44 - 121 IU/L   AST 37 0 - 40 IU/L   ALT 26 0 - 32 IU/L  Hepatitis C genotype  Result Value Ref Range   Hepatitis C Genotype WILL FOLLOW    Please Note (HCV): WILL FOLLOW   Hepatitis A antibody, total  Result Value Ref Range   hep A Total Ab Positive (A) Negative  Hepatitis B core antibody, IgM  Result Value Ref Range   Hep B C IgM Positive (A) Negative  Hepatitis B surface antibody,quantitative  Result Value Ref Range   Hepatitis B Surf Ab Quant 9.1 (L) Immunity>9.9 mIU/mL  Bilirubin, Direct  Result Value Ref Range   Bilirubin, Direct 0.24 0.00 - 0.40 mg/dL  Hepatitis C Antibody  Result Value Ref Range   Hep C Virus Ab >11.0 (H) 0.0 - 0.9 s/co ratio        Current Outpatient Medications:    gabapentin (NEURONTIN) 600 MG tablet, Take 1,200 mg by mouth 3 (three) times daily., Disp: , Rfl:    traMADol (ULTRAM) 50 MG tablet, Take 1 tablet (50 mg total) by mouth every 12 (twelve) hours as needed for up to 7 days., Disp: 14 tablet, Rfl: 0    Assessment & Plan:  BRBPR/melena will refer to GI ASAP stat CBC shows a normal hemoglobin 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   Orders placed for such.  2.  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  She will need to follow-up with her prosthetic physician/vascular surgeons for further management as she has an ill fitting prosthetic limb.  3.  Chronic pain secondary to right-sided prosthesis.  Will need to see pain management however in the meanwhile we will refill her tramadol to take this every 12 hours as needed   Problem List Items Addressed This Visit       Digestive    BRBPR (bright red blood per rectum)   Relevant Orders   CBC With Differential/Platelet (Completed)     Other   Elevated LFTs   Relevant Orders   CBC with Differential/Platelet (Completed)   Comprehensive metabolic panel (Completed)   Hepatitis C genotype (Completed)   Hepatitis A antibody, total (Completed)   Hepatitis B core antibody, IgM (Completed)   Hepatitis B surface antibody,quantitative (Completed)   Bilirubin, Direct (Completed)   Hepatitis C Antibody (Completed)   Bloody stool - Primary   Relevant Orders   Ambulatory referral to Gastroenterology   CBC With Differential/Platelet (Completed)   Other Visit Diagnoses     Pain       Relevant Orders   Ambulatory referral to Pain Clinic   Hepatitis B antibody positive       Relevant Orders   Ambulatory referral to Gastroenterology   Hepatitis B Surface AntiGEN  Hepatitis C antibody test positive       Relevant Orders   Ambulatory referral to Gastroenterology   Hepatitis B Surface AntiGEN        Orders Placed This Encounter  Procedures   CBC With Differential/Platelet   CBC with Differential/Platelet   Comprehensive metabolic panel   Hepatitis C genotype   Hepatitis A antibody, total   Hepatitis B core antibody, IgM   Hepatitis B surface antibody,quantitative   Bilirubin, Direct   Hepatitis C Antibody   Hepatitis B Surface AntiGEN   Ambulatory referral to Gastroenterology   Ambulatory referral to Pain Clinic   Ambulatory referral to Gastroenterology     Meds ordered this encounter  Medications   traMADol (ULTRAM) 50 MG tablet    Sig: Take 1 tablet (50 mg total) by mouth every 12 (twelve) hours as needed for up to 7 days.    Dispense:  14 tablet    Refill:  0     Follow up plan: No follow-ups on file.

## 2021-03-26 ENCOUNTER — Telehealth (INDEPENDENT_AMBULATORY_CARE_PROVIDER_SITE_OTHER): Payer: Self-pay | Admitting: Nurse Practitioner

## 2021-03-26 DIAGNOSIS — R52 Pain, unspecified: Secondary | ICD-10-CM | POA: Insufficient documentation

## 2021-03-26 LAB — CBC WITH DIFFERENTIAL/PLATELET
Hematocrit: 42.7 % (ref 34.0–46.6)
Hemoglobin: 15.4 g/dL (ref 11.1–15.9)
Lymphocytes Absolute: 1.2 10*3/uL (ref 0.7–3.1)
Lymphs: 26 %
MCH: 33.3 pg — ABNORMAL HIGH (ref 26.6–33.0)
MCHC: 36.1 g/dL — ABNORMAL HIGH (ref 31.5–35.7)
MCV: 92 fL (ref 79–97)
MID (Absolute): 0.4 10*3/uL (ref 0.1–1.6)
MID: 8 %
Neutrophils Absolute: 2.8 10*3/uL (ref 1.4–7.0)
Neutrophils: 65 %
Platelets: 74 10*3/uL — CL (ref 150–450)
RBC: 4.63 x10E6/uL (ref 3.77–5.28)
RDW: 14.5 % (ref 11.7–15.4)
WBC: 4.4 10*3/uL (ref 3.4–10.8)

## 2021-03-26 NOTE — Telephone Encounter (Signed)
Have concerns about where leg was amputated. Need an office visit.  Having a lot of pain with new prosthetic   Message patient sent from [place]. Please advise.

## 2021-03-26 NOTE — Telephone Encounter (Signed)
She needs to reach out to who did her prosthetic to see if it due to the prosthetic fit first.  Once they have evaluated, they can let us know.  We can't do anything about the prosthetic fit so that needs an eval first

## 2021-03-26 NOTE — Progress Notes (Signed)
  Has +ve  hep B core antibody  ab, Hep C +Ve and has BRBPR Will refer pt to GI / Hepatology already d/w them. Please let pt know thnx.

## 2021-03-26 NOTE — Telephone Encounter (Signed)
Made patient aware of NP's note; Patient states that she is concerned about the "ball of nerves" that she mentioned to FB at last visit. Paitent states that she would also if possible get a 1 week Rx of Hydrocodone to [facility] in [place] until she sees pain management on 04/03/21. Patient is to call facility for prosthetic to be evaluated.

## 2021-03-28 ENCOUNTER — Encounter: Payer: Self-pay | Admitting: Gastroenterology

## 2021-03-28 ENCOUNTER — Other Ambulatory Visit: Payer: Self-pay

## 2021-03-28 ENCOUNTER — Ambulatory Visit (INDEPENDENT_AMBULATORY_CARE_PROVIDER_SITE_OTHER): Payer: Medicaid Other | Admitting: Gastroenterology

## 2021-03-28 ENCOUNTER — Encounter: Payer: Self-pay | Admitting: Oncology

## 2021-03-28 VITALS — BP 129/81 | HR 79 | Temp 97.8°F | Ht 64.0 in | Wt 151.2 lb

## 2021-03-28 DIAGNOSIS — K7469 Other cirrhosis of liver: Secondary | ICD-10-CM

## 2021-03-28 DIAGNOSIS — K2210 Ulcer of esophagus without bleeding: Secondary | ICD-10-CM | POA: Diagnosis not present

## 2021-03-28 DIAGNOSIS — B182 Chronic viral hepatitis C: Secondary | ICD-10-CM

## 2021-03-28 DIAGNOSIS — Z79899 Other long term (current) drug therapy: Secondary | ICD-10-CM | POA: Diagnosis not present

## 2021-03-28 DIAGNOSIS — K921 Melena: Secondary | ICD-10-CM

## 2021-03-28 LAB — COMPREHENSIVE METABOLIC PANEL
ALT: 26 IU/L (ref 0–32)
AST: 37 IU/L (ref 0–40)
Albumin/Globulin Ratio: 1.3 (ref 1.2–2.2)
Albumin: 4 g/dL (ref 3.8–4.8)
Alkaline Phosphatase: 90 IU/L (ref 44–121)
BUN/Creatinine Ratio: 13 (ref 9–23)
BUN: 11 mg/dL (ref 6–24)
Bilirubin Total: 0.5 mg/dL (ref 0.0–1.2)
CO2: 19 mmol/L — ABNORMAL LOW (ref 20–29)
Calcium: 8.9 mg/dL (ref 8.7–10.2)
Chloride: 104 mmol/L (ref 96–106)
Creatinine, Ser: 0.85 mg/dL (ref 0.57–1.00)
Globulin, Total: 3 g/dL (ref 1.5–4.5)
Glucose: 96 mg/dL (ref 70–99)
Potassium: 4 mmol/L (ref 3.5–5.2)
Sodium: 137 mmol/L (ref 134–144)
Total Protein: 7 g/dL (ref 6.0–8.5)
eGFR: 86 mL/min/{1.73_m2} (ref >59)

## 2021-03-28 LAB — HEPATITIS B CORE ANTIBODY, IGM: Hep B C IgM: POSITIVE — AB

## 2021-03-28 LAB — CBC WITH DIFFERENTIAL/PLATELET
Basophils Absolute: 0 10*3/uL (ref 0.0–0.2)
Basos: 1 %
EOS (ABSOLUTE): 0.1 10*3/uL (ref 0.0–0.4)
Eos: 2 %
Hematocrit: 42.4 % (ref 34.0–46.6)
Hemoglobin: 15 g/dL (ref 11.1–15.9)
Immature Grans (Abs): 0 10*3/uL (ref 0.0–0.1)
Immature Granulocytes: 0 %
Lymphocytes Absolute: 1.1 10*3/uL (ref 0.7–3.1)
Lymphs: 26 %
MCH: 32.4 pg (ref 26.6–33.0)
MCHC: 35.4 g/dL (ref 31.5–35.7)
MCV: 92 fL (ref 79–97)
Monocytes Absolute: 0.4 10*3/uL (ref 0.1–0.9)
Monocytes: 9 %
Neutrophils Absolute: 2.7 10*3/uL (ref 1.4–7.0)
Neutrophils: 62 %
Platelets: 79 10*3/uL — CL (ref 150–450)
RBC: 4.63 x10E6/uL (ref 3.77–5.28)
RDW: 12.1 % (ref 11.7–15.4)
WBC: 4.4 10*3/uL (ref 3.4–10.8)

## 2021-03-28 LAB — SPECIMEN STATUS REPORT

## 2021-03-28 LAB — HEPATITIS B SURFACE ANTIBODY, QUANTITATIVE: Hepatitis B Surf Ab Quant: 9.1 m[IU]/mL — ABNORMAL LOW (ref >9.9)

## 2021-03-28 LAB — HEPATITIS B SURFACE ANTIGEN: Hepatitis B Surface Ag: NEGATIVE

## 2021-03-28 LAB — HEPATITIS A ANTIBODY, TOTAL: hep A Total Ab: POSITIVE — AB

## 2021-03-28 LAB — BILIRUBIN, DIRECT: Bilirubin, Direct: 0.24 mg/dL (ref 0.00–0.40)

## 2021-03-28 LAB — HEPATITIS C GENOTYPE

## 2021-03-28 LAB — HEPATITIS C ANTIBODY: Hep C Virus Ab: >11 s/co ratio — ABNORMAL HIGH (ref 0.0–0.9)

## 2021-03-28 MED ORDER — GOLYTELY 236 G PO SOLR: 4000.0000 mL | Freq: Once | ORAL | 0 refills | Status: AC

## 2021-03-28 MED ORDER — OMEPRAZOLE 40 MG PO CPDR: 40.0000 mg | DELAYED_RELEASE_CAPSULE | Freq: Every day | ORAL | 2 refills | Status: DC
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
## 2021-03-29 LAB — VITAMIN D 25 HYDROXY (VIT D DEFICIENCY, FRACTURES): Vit D, 25-Hydroxy: 38.2 ng/mL (ref 30.0–100.0)

## 2021-03-29 LAB — AFP TUMOR MARKER: AFP, Serum, Tumor Marker: 2.6 ng/mL (ref 0.0–6.4)

## 2021-04-03 DIAGNOSIS — Z79899 Other long term (current) drug therapy: Secondary | ICD-10-CM | POA: Diagnosis not present

## 2021-04-15 ENCOUNTER — Ambulatory Visit
Admission: RE | Admit: 2021-04-15 | Discharge: 2021-04-15 | Disposition: A | Discharge status: Home / Self Care | Payer: Medicaid Other | Attending: Gastroenterology | Admitting: Gastroenterology

## 2021-04-15 ENCOUNTER — Other Ambulatory Visit: Payer: Self-pay

## 2021-04-15 ENCOUNTER — Encounter: Payer: Self-pay | Admitting: Gastroenterology

## 2021-04-15 ENCOUNTER — Ambulatory Visit: Payer: Medicaid Other | Admitting: Anesthesiology

## 2021-04-15 ENCOUNTER — Encounter
Admission: RE | Disposition: A | Discharge status: Home / Self Care | Payer: Self-pay | Source: Home / Self Care | Attending: Gastroenterology

## 2021-04-15 DIAGNOSIS — Z8719 Personal history of other diseases of the digestive system: Secondary | ICD-10-CM | POA: Diagnosis not present

## 2021-04-15 DIAGNOSIS — I8510 Secondary esophageal varices without bleeding: Secondary | ICD-10-CM | POA: Diagnosis not present

## 2021-04-15 DIAGNOSIS — K648 Other hemorrhoids: Secondary | ICD-10-CM | POA: Insufficient documentation

## 2021-04-15 DIAGNOSIS — K2210 Ulcer of esophagus without bleeding: Secondary | ICD-10-CM

## 2021-04-15 DIAGNOSIS — D126 Benign neoplasm of colon, unspecified: Secondary | ICD-10-CM | POA: Diagnosis not present

## 2021-04-15 DIAGNOSIS — K635 Polyp of colon: Secondary | ICD-10-CM | POA: Diagnosis not present

## 2021-04-15 DIAGNOSIS — F17210 Nicotine dependence, cigarettes, uncomplicated: Secondary | ICD-10-CM | POA: Insufficient documentation

## 2021-04-15 DIAGNOSIS — K3189 Other diseases of stomach and duodenum: Secondary | ICD-10-CM | POA: Diagnosis not present

## 2021-04-15 DIAGNOSIS — K921 Melena: Secondary | ICD-10-CM | POA: Insufficient documentation

## 2021-04-15 DIAGNOSIS — F419 Anxiety disorder, unspecified: Secondary | ICD-10-CM | POA: Diagnosis not present

## 2021-04-15 DIAGNOSIS — D128 Benign neoplasm of rectum: Secondary | ICD-10-CM | POA: Diagnosis not present

## 2021-04-15 DIAGNOSIS — I868 Varicose veins of other specified sites: Secondary | ICD-10-CM | POA: Insufficient documentation

## 2021-04-15 DIAGNOSIS — I8500 Esophageal varices without bleeding: Secondary | ICD-10-CM | POA: Diagnosis not present

## 2021-04-15 DIAGNOSIS — K7460 Unspecified cirrhosis of liver: Secondary | ICD-10-CM | POA: Insufficient documentation

## 2021-04-15 DIAGNOSIS — K621 Rectal polyp: Secondary | ICD-10-CM | POA: Insufficient documentation

## 2021-04-15 HISTORY — PX: COLONOSCOPY WITH PROPOFOL: SHX5780

## 2021-04-15 HISTORY — PX: ESOPHAGOGASTRODUODENOSCOPY: SHX5428

## 2021-04-15 LAB — POCT PREGNANCY, URINE: Preg Test, Ur: NEGATIVE

## 2021-04-15 SURGERY — COLONOSCOPY WITH PROPOFOL
Anesthesia: General

## 2021-04-15 MED ORDER — PHENYLEPHRINE HCL (PRESSORS) 10 MG/ML IV SOLN
INTRAVENOUS | Status: DC | PRN
  Administered 2021-04-15: 100 ug via INTRAVENOUS

## 2021-04-15 MED ORDER — PROPOFOL 10 MG/ML IV BOLUS
INTRAVENOUS | Status: DC | PRN
  Administered 2021-04-15: 60 mg via INTRAVENOUS

## 2021-04-15 MED ORDER — PROPOFOL 10 MG/ML IV BOLUS
INTRAVENOUS | Status: AC
  Filled 2021-04-15: qty 20

## 2021-04-15 MED ORDER — PROPOFOL 500 MG/50ML IV EMUL
INTRAVENOUS | Status: DC | PRN
  Administered 2021-04-15: 200 ug/kg/min via INTRAVENOUS

## 2021-04-15 MED ORDER — SODIUM CHLORIDE 0.9 % IV SOLN: INTRAVENOUS | Status: DC

## 2021-04-15 MED ORDER — PROPOFOL 500 MG/50ML IV EMUL
INTRAVENOUS | Status: AC
  Filled 2021-04-15: qty 50

## 2021-04-15 NOTE — Anesthesia Postprocedure Evaluation (Signed)
Anesthesia Post Note  Patient: Rhonda Martin  Procedure(s) Performed: COLONOSCOPY WITH PROPOFOL ESOPHAGOGASTRODUODENOSCOPY (EGD)  Patient location during evaluation: Endoscopy Anesthesia Type: General Level of consciousness: awake and alert Pain management: pain level controlled Vital Signs Assessment: post-procedure vital signs reviewed and stable Respiratory status: spontaneous breathing, nonlabored ventilation, respiratory function stable and patient connected to nasal cannula oxygen Cardiovascular status: blood pressure returned to baseline and stable Postop Assessment: no apparent nausea or vomiting Anesthetic complications: no   No notable events documented.   Last Vitals:  Vitals:   04/15/21 0857 04/15/21 1006  BP: 127/72 95/70  Pulse: 63 72  Resp: 17 (!) 21  Temp: 36.9 C (!) 36.4 C  SpO2: 99% 99%    Last Pain:  Vitals:   04/15/21 1026  TempSrc:   PainSc: 0-No pain                 Arita Miss

## 2021-04-15 NOTE — Op Note (Signed)
[facility] Gastroenterology Patient Name: Rhonda Martin Procedure Date: 04/15/2021 9:18 AM MRN: [id number] Account #: [id number] Date of Birth: 30-Apr-1976 Admit Type: Outpatient Age: 45 Room: [facility] ENDO ROOM 3 Gender: Female Note Status: Finalized Instrument Name: Jasper Riling [id number] Procedure:             Colonoscopy Indications:           Hematochezia, Rectal bleeding Providers:             Lin Landsman MD, MD Referring MD:          Charlynne Cousins (Referring MD) Medicines:             General Anesthesia Complications:         No immediate complications. Estimated blood loss: None. Procedure:             Pre-Anesthesia Assessment:                        - Prior to the procedure, a History and Physical was                         performed, and patient medications and allergies were                         reviewed. The patient is competent. The risks and                         benefits of the procedure and the sedation options and                         risks were discussed with the patient. All questions                         were answered and informed consent was obtained.                         Patient identification and proposed procedure were                         verified by the physician, the nurse, the                         anesthesiologist, the anesthetist and the technician                         in the pre-procedure area in the procedure room in the                         endoscopy suite. Mental Status Examination: alert and                         oriented. Airway Examination: normal oropharyngeal                         airway and neck mobility. Respiratory Examination:                         clear to auscultation. CV Examination: normal.  Prophylactic Antibiotics: The patient does not require                         prophylactic antibiotics. Prior Anticoagulants: The                         patient has taken no  previous anticoagulant or                         antiplatelet agents. ASA Grade Assessment: III - A                         patient with severe systemic disease. After reviewing                         the risks and benefits, the patient was deemed in                         satisfactory condition to undergo the procedure. The                         anesthesia plan was to use general anesthesia.                         Immediately prior to administration of medications,                         the patient was re-assessed for adequacy to receive                         sedatives. The heart rate, respiratory rate, oxygen                         saturations, blood pressure, adequacy of pulmonary                         ventilation, and response to care were monitored                         throughout the procedure. The physical status of the                         patient was re-assessed after the procedure.                        After obtaining informed consent, the colonoscope was                         passed under direct vision. Throughout the procedure,                         the patient's blood pressure, pulse, and oxygen                         saturations were monitored continuously. The                         Colonoscope was introduced through the anus and  advanced to the the terminal ileum. The colonoscopy                         was performed without difficulty. The patient                         tolerated the procedure well. The quality of the bowel                         preparation was good. Findings:      The perianal and digital rectal examinations were normal. Pertinent       negatives include normal sphincter tone and no palpable rectal lesions.      The terminal ileum appeared normal.      Two sessile polyps were found in the rectum. The polyps were 3 to 4 mm       in size. These polyps were removed with a cold snare. Resection and        retrieval were complete.      Non-bleeding rectal varices were found.      The exam was otherwise without abnormality. Impression:            - The examined portion of the ileum was normal.                        - Two 3 to 4 mm polyps in the rectum, removed with a                         cold snare. Resected and retrieved.                        - Rectal varices.                        - The examination was otherwise normal. Recommendation:        - Discharge patient to home (with escort).                        - Low sodium diet for the rest of the patient's life.                        - Continue present medications.                        - Await pathology results.                        - Return to my office as previously scheduled.                        - Repeat colonoscopy in 7-10 years for surveillance                         based on pathology results. Procedure Code(s):     --- Professional ---                        [phone], Colonoscopy, flexible; with removal of  tumor(s), polyp(s), or other lesion(s) by snare                         technique Diagnosis Code(s):     --- Professional ---                        K62.1, Rectal polyp                        K64.8, Other hemorrhoids                        K92.1, Melena (includes Hematochezia)                        K62.5, Hemorrhage of anus and rectum CPT copyright 2019 American Medical Association. All rights reserved. The codes documented in this report are preliminary and upon coder review may  be revised to meet current compliance requirements. Dr. Ulyess Mort Lin Landsman MD, MD 04/15/2021 10:02:27 AM This report has been signed electronically. Number of Addenda: 0 Note Initiated On: 04/15/2021 9:18 AM Scope Withdrawal Time: 0 hours 9 minutes 3 seconds  Total Procedure Duration: 0 hours 14 minutes 46 seconds  Estimated Blood Loss:  Estimated blood loss: none.      [facility]

## 2021-04-15 NOTE — Op Note (Signed)
[facility] Gastroenterology Patient Name: Rhonda Martin Procedure Date: 04/15/2021 9:19 AM MRN: [id number] Account #: [id number] Date of Birth: [birth year] Admit Type: Outpatient Age: 45 Room: [facility] ENDO ROOM 3 Gender: Female Note Status: Finalized Instrument Name: Upper Endoscope [id number] Procedure:             Upper GI endoscopy Indications:           Cirrhosis rule out esophageal varices Providers:             Lin Landsman MD, MD Referring MD:          Charlynne Cousins (Referring MD) Complications:         No immediate complications. Estimated blood loss: None. Procedure:             Pre-Anesthesia Assessment:                        - Prior to the procedure, a History and Physical was                         performed, and patient medications and allergies were                         reviewed. The patient is competent. The risks and                         benefits of the procedure and the sedation options and                         risks were discussed with the patient. All questions                         were answered and informed consent was obtained.                         Patient identification and proposed procedure were                         verified by the physician, the nurse, the                         anesthesiologist, the anesthetist and the technician                         in the pre-procedure area in the procedure room in the                         endoscopy suite. Mental Status Examination: alert and                         oriented. Airway Examination: normal oropharyngeal                         airway and neck mobility. Respiratory Examination:                         clear to auscultation. CV Examination: normal.  Prophylactic Antibiotics: The patient does not require                         prophylactic antibiotics. Prior Anticoagulants: The                         patient has taken no previous anticoagulant or                          antiplatelet agents. ASA Grade Assessment: III - A                         patient with severe systemic disease. After reviewing                         the risks and benefits, the patient was deemed in                         satisfactory condition to undergo the procedure. The                         anesthesia plan was to use general anesthesia.                         Immediately prior to administration of medications,                         the patient was re-assessed for adequacy to receive                         sedatives. The heart rate, respiratory rate, oxygen                         saturations, blood pressure, adequacy of pulmonary                         ventilation, and response to care were monitored                         throughout the procedure. The physical status of the                         patient was re-assessed after the procedure.                        After obtaining informed consent, the endoscope was                         passed under direct vision. Throughout the procedure,                         the patient's blood pressure, pulse, and oxygen                         saturations were monitored continuously. The Endoscope                         was introduced through the mouth, and advanced to the  second part of duodenum. The upper GI endoscopy was                         accomplished without difficulty. The patient tolerated                         the procedure well. Findings:      The duodenal bulb and second portion of the duodenum were normal.      Severe, diffuse portal hypertensive gastropathy was found in the entire       examined stomach.      The cardia and gastric fundus were normal on retroflexion.      Esophagogastric landmarks were identified: the gastroesophageal junction       was found at 35 cm from the incisors.      Four columns of large (> 5 mm) varices with no bleeding and no  stigmata       of recent bleeding were found in the middle third of the esophagus and       in the lower third of the esophagus, 35 cm from the incisors. No red       wale signs were present. Two bands were successfully placed with       incomplete eradication of varices. There was no bleeding during and at       the end of the procedure. Impression:            - Normal duodenal bulb and second portion of the                         duodenum.                        - Portal hypertensive gastropathy.                        - Esophagogastric landmarks identified.                        - Large (> 5 mm) esophageal varices with no bleeding                         and no stigmata of recent bleeding. Incompletely                         eradicated. Banded.                        - No specimens collected. Recommendation:        - Repeat upper endoscopy in 4 weeks for endoscopic                         band ligation.                        - Proceed with colonoscopy as scheduled                        See colonoscopy report Procedure Code(s):     --- Professional ---                        [phone], Esophagogastroduodenoscopy, flexible,  transoral; with band ligation of esophageal/gastric                         varices Diagnosis Code(s):     --- Professional ---                        K74.60, Unspecified cirrhosis of liver                        I85.10, Secondary esophageal varices without bleeding                        K76.6, Portal hypertension                        K31.89, Other diseases of stomach and duodenum CPT copyright 2019 American Medical Association. All rights reserved. The codes documented in this report are preliminary and upon coder review may  be revised to meet current compliance requirements. Dr. Ulyess Mort Lin Landsman MD, MD 04/15/2021 9:42:51 AM This report has been signed electronically. Number of Addenda: 0 Note Initiated On: 04/15/2021  9:19 AM Estimated Blood Loss:  Estimated blood loss: none.      [facility]

## 2021-04-15 NOTE — Transfer of Care (Signed)
Immediate Anesthesia Transfer of Care Note  Patient: Rhonda Martin  Procedure(s) Performed: COLONOSCOPY WITH PROPOFOL ESOPHAGOGASTRODUODENOSCOPY (EGD)  Patient Location: PACU  Anesthesia Type:General  Level of Consciousness: awake and alert   Airway & Oxygen Therapy: Patient Spontanous Breathing  Post-op Assessment: Report given to RN and Post -op Vital signs reviewed and stable  Post vital signs: Reviewed and stable  Last Vitals:  Vitals Value Taken Time  BP 95/70 04/15/21 1005  Temp    Pulse 71 04/15/21 1006  Resp 12 04/15/21 1006  SpO2 98 % 04/15/21 1006  Vitals shown include unvalidated device data.  Last Pain:  Vitals:   04/15/21 0857  TempSrc: Temporal  PainSc: 0-No pain         Complications: No notable events documented.

## 2021-04-15 NOTE — Anesthesia Preprocedure Evaluation (Signed)
Anesthesia Evaluation  Patient identified by MRN, date of birth, ID band Patient awake    Reviewed: Allergy & Precautions, NPO status , Patient's Chart, lab work & pertinent test results  History of Anesthesia Complications Negative for: history of anesthetic complications  Airway Mallampati: II  TM Distance: >3 FB     Dental  (+) Poor Dentition, Dental Advidsory Given   Pulmonary COPD,  COPD inhaler, Current SmokerPatient did not abstain from smoking.,    breath sounds clear to auscultation- rhonchi (-) wheezing      Cardiovascular hypertension, Pt. on medications (-) CAD, (-) Past MI, (-) Cardiac Stents and (-) CABG  Rhythm:Regular Rate:Normal - Systolic murmurs and - Diastolic murmurs    Neuro/Psych neg Seizures PSYCHIATRIC DISORDERS Anxiety Depression negative neurological ROS     GI/Hepatic GERD  ,(+) Cirrhosis     (-) substance abuse  , Hepatitis -  Endo/Other  negative endocrine ROSneg diabetes  Renal/GU negative Renal ROS     Musculoskeletal negative musculoskeletal ROS (+)   Abdominal (+) - obese,   Peds  Hematology  (+) Blood dyscrasia, anemia ,   Anesthesia Other Findings  Past Medical History: No date: Alcohol abuse 01/20/2019: Alcohol withdrawal delirium ([place]) No date: Anxiety No date: Back injury No date: Cervical cancer (HCC) 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 No date: COPD (chronic obstructive pulmonary disease) (HCC) No date: 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 No date: Family history of adverse reaction to anesthesia     Comment:  PONV No date: GERD (gastroesophageal reflux disease) 10/23/2019: Hematemesis 08/23/2019: Hepatic encephalopathy No date: Hepatitis     Comment:  liver fibrosis, Hep C negative on 09/3019 No date: Hypertension No date: Hypokalemia 06/26/2019: IDA (iron deficiency anemia) 06/26/2019: IDA (iron deficiency anemia) No date: Iron deficiency  anemia No date: Leg injury No date: Liver cirrhosis ([employer]) No date: Pneumonia 07/10/2019: Sepsis ([place]) 06/26/2019: Symptomatic anemia No date: Thrombocytopenia (HC  Reproductive/Obstetrics negative OB ROS                            Anesthesia Physical  Anesthesia Plan  ASA: 3  Anesthesia Plan: General   Post-op Pain Management:    Induction: Intravenous  PONV Risk Score and Plan: 2 and Ondansetron, Propofol infusion and TIVA  Airway Management Planned: Natural Airway  Additional Equipment: None  Intra-op Plan:   Post-operative Plan:   Informed Consent: I have reviewed the patients History and Physical, chart, labs and discussed the procedure including the risks, benefits and alternatives for the proposed anesthesia with the patient or authorized representative who has indicated his/her understanding and acceptance.     Dental advisory given  Plan Discussed with: CRNA and Surgeon  Anesthesia Plan Comments: (Discussed risks of anesthesia with patient, including possibility of difficulty with spontaneous ventilation under anesthesia necessitating airway intervention, PONV, and rare risks such as cardiac or respiratory or neurological events, and allergic reactions. Discussed the role of CRNA in patient's perioperative care. Patient understands.)        Anesthesia Quick Evaluation
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
## 2021-04-16 ENCOUNTER — Encounter: Payer: Self-pay | Admitting: Gastroenterology

## 2021-04-16 LAB — SURGICAL PATHOLOGY

## 2021-04-19 ENCOUNTER — Encounter: Payer: Self-pay | Admitting: Internal Medicine

## 2021-04-21 NOTE — Telephone Encounter (Signed)
Sure thing!thnx. Pl have her fu with me for there other issues thnx.

## 2021-04-22 ENCOUNTER — Encounter: Payer: Self-pay | Admitting: Gastroenterology

## 2021-04-22 ENCOUNTER — Other Ambulatory Visit: Payer: Self-pay

## 2021-04-22 DIAGNOSIS — R7989 Other specified abnormal findings of blood chemistry: Secondary | ICD-10-CM

## 2021-04-22 NOTE — Telephone Encounter (Signed)
Called patient to schedule appointment, unable to leave voicemail.

## 2021-04-23 ENCOUNTER — Encounter: Payer: Self-pay | Admitting: Internal Medicine

## 2021-04-24 ENCOUNTER — Telehealth: Payer: Self-pay

## 2021-04-24 ENCOUNTER — Other Ambulatory Visit: Payer: Self-pay | Admitting: Internal Medicine

## 2021-04-24 DIAGNOSIS — Z30431 Encounter for routine checking of intrauterine contraceptive device: Secondary | ICD-10-CM

## 2021-04-24 NOTE — Telephone Encounter (Signed)
Referral sent for signature.

## 2021-04-25 NOTE — Telephone Encounter (Signed)
error 

## 2021-04-25 NOTE — Telephone Encounter (Signed)
Appointment scheduled.

## 2021-04-30 DIAGNOSIS — Z79899 Other long term (current) drug therapy: Secondary | ICD-10-CM | POA: Diagnosis not present

## 2021-05-08 ENCOUNTER — Encounter: Payer: Self-pay | Admitting: Oncology

## 2021-05-12 ENCOUNTER — Ambulatory Visit: Payer: Medicaid Other | Admitting: Internal Medicine

## 2021-05-13 NOTE — Telephone Encounter (Signed)
No Show 01/31/2020

## 2021-05-14 ENCOUNTER — Encounter: Payer: Self-pay | Admitting: Gastroenterology

## 2021-05-15 ENCOUNTER — Ambulatory Visit: Payer: Medicaid Other | Admitting: Anesthesiology

## 2021-05-15 ENCOUNTER — Ambulatory Visit
Admission: RE | Admit: 2021-05-15 | Discharge: 2021-05-15 | Disposition: A | Discharge status: Home / Self Care | Payer: Medicaid Other | Attending: Gastroenterology | Admitting: Gastroenterology

## 2021-05-15 ENCOUNTER — Encounter
Admission: RE | Disposition: A | Discharge status: Home / Self Care | Payer: Self-pay | Source: Home / Self Care | Attending: Gastroenterology

## 2021-05-15 ENCOUNTER — Encounter: Payer: Self-pay | Admitting: Gastroenterology

## 2021-05-15 DIAGNOSIS — K766 Portal hypertension: Secondary | ICD-10-CM | POA: Diagnosis not present

## 2021-05-15 DIAGNOSIS — I8510 Secondary esophageal varices without bleeding: Secondary | ICD-10-CM | POA: Insufficient documentation

## 2021-05-15 DIAGNOSIS — F17210 Nicotine dependence, cigarettes, uncomplicated: Secondary | ICD-10-CM | POA: Insufficient documentation

## 2021-05-15 DIAGNOSIS — R7989 Other specified abnormal findings of blood chemistry: Secondary | ICD-10-CM

## 2021-05-15 DIAGNOSIS — K3189 Other diseases of stomach and duodenum: Secondary | ICD-10-CM | POA: Insufficient documentation

## 2021-05-15 DIAGNOSIS — K7030 Alcoholic cirrhosis of liver without ascites: Secondary | ICD-10-CM | POA: Diagnosis not present

## 2021-05-15 HISTORY — PX: ESOPHAGOGASTRODUODENOSCOPY (EGD) WITH PROPOFOL: SHX5813

## 2021-05-15 LAB — POCT PREGNANCY, URINE: Preg Test, Ur: NEGATIVE

## 2021-05-15 SURGERY — ESOPHAGOGASTRODUODENOSCOPY (EGD) WITH PROPOFOL
Anesthesia: General

## 2021-05-15 MED ORDER — MORPHINE SULFATE (PF) 2 MG/ML IV SOLN
2.0000 mg | Freq: Once | INTRAVENOUS | Status: AC
  Administered 2021-05-15: 2 mg via INTRAVENOUS

## 2021-05-15 MED ORDER — MORPHINE SULFATE (PF) 2 MG/ML IV SOLN
INTRAVENOUS | Status: AC
  Filled 2021-05-15: qty 1

## 2021-05-15 MED ORDER — SODIUM CHLORIDE 0.9 % IV SOLN: INTRAVENOUS | Status: DC

## 2021-05-15 MED ORDER — PROPOFOL 500 MG/50ML IV EMUL
INTRAVENOUS | Status: AC
  Filled 2021-05-15: qty 50

## 2021-05-15 MED ORDER — LIDOCAINE HCL (PF) 2 % IJ SOLN
INTRAMUSCULAR | Status: AC
  Filled 2021-05-15: qty 5

## 2021-05-15 MED ORDER — LIDOCAINE HCL (CARDIAC) PF 100 MG/5ML IV SOSY
PREFILLED_SYRINGE | INTRAVENOUS | Status: DC | PRN
  Administered 2021-05-15: 50 mg via INTRAVENOUS

## 2021-05-15 MED ORDER — PROPOFOL 500 MG/50ML IV EMUL
INTRAVENOUS | Status: DC | PRN
  Administered 2021-05-15: 150 ug/kg/min via INTRAVENOUS

## 2021-05-15 MED ORDER — MORPHINE SULFATE (PF) 4 MG/ML IV SOLN: 2.0000 mg | Freq: Once | INTRAVENOUS | Status: DC

## 2021-05-15 NOTE — Anesthesia Procedure Notes (Signed)
Date/Time: 05/15/2021 7:57 AM Performed by: Vaughan Sine Pre-anesthesia Checklist: Patient identified, Emergency Drugs available, Suction available, Patient being monitored and Timeout performed Oxygen Delivery Method: Simple face mask Preoxygenation: Pre-oxygenation with 100% oxygen Induction Type: IV induction Airway Equipment and Method: Bite block Placement Confirmation: positive ETCO2 and CO2 detector

## 2021-05-15 NOTE — Progress Notes (Signed)
Patient advised to take home pain medication per Dr. Marius Ditch, as patient still requesting pain medication for "esophagus Pain." VSS, ox-97%. Resting quietly. Went over instructions with family member and patient

## 2021-05-15 NOTE — Transfer of Care (Signed)
Immediate Anesthesia Transfer of Care Note  Patient: Rhonda Martin  Procedure(s) Performed: ESOPHAGOGASTRODUODENOSCOPY (EGD) WITH PROPOFOL  Patient Location: PACU  Anesthesia Type:General  Level of Consciousness: awake and sedated  Airway & Oxygen Therapy: Patient Spontanous Breathing and Patient connected to nasal cannula oxygen  Post-op Assessment: Report given to RN and Post -op Vital signs reviewed and stable  Post vital signs: Reviewed and stable  Last Vitals:  Vitals Value Taken Time  BP    Temp    Pulse    Resp    SpO2      Last Pain:  Vitals:   05/15/21 0709  TempSrc: Temporal  PainSc: 7          Complications: No notable events documented.

## 2021-05-15 NOTE — Op Note (Signed)
[facility]astroenterology Patient Name: Rhonda Martin Procedure Date: 05/15/2021 7:52 AM MRN: [id number] Account #: [id number] Date of Birth: 1975-11-11 Admit Type: Outpatient Age: 45 Room: [facility] ENDO ROOM 3 Gender: Female Note Status: Finalized Instrument Name: Upper Endoscope [id number] Procedure:             Upper GI endoscopy Indications:           Follow-up of esophageal varices, For therapy of                         esophageal varices Providers:             Lin Landsman MD, MD Referring MD:          Charlynne Cousins (Referring MD) Medicines:             General Anesthesia Complications:         No immediate complications. Estimated blood loss: None. Procedure:             Pre-Anesthesia Assessment:                        - Prior to the procedure, a History and Physical was                         performed, and patient medications and allergies were                         reviewed. The patient is competent. The risks and                         benefits of the procedure and the sedation options and                         risks were discussed with the patient. All questions                         were answered and informed consent was obtained.                         Patient identification and proposed procedure were                         verified by the physician, the nurse, the                         anesthesiologist, the anesthetist and the technician                         in the pre-procedure area in the procedure room in the                         endoscopy suite. Mental Status Examination: alert and                         oriented. Airway Examination: normal oropharyngeal                         airway and neck mobility. Respiratory Examination:  clear to auscultation. CV Examination: normal.                         Prophylactic Antibiotics: The patient does not require                         prophylactic antibiotics.  Prior Anticoagulants: The                         patient has taken no previous anticoagulant or                         antiplatelet agents. ASA Grade Assessment: III - A                         patient with severe systemic disease. After reviewing                         the risks and benefits, the patient was deemed in                         satisfactory condition to undergo the procedure. The                         anesthesia plan was to use general anesthesia.                         Immediately prior to administration of medications,                         the patient was re-assessed for adequacy to receive                         sedatives. The heart rate, respiratory rate, oxygen                         saturations, blood pressure, adequacy of pulmonary                         ventilation, and response to care were monitored                         throughout the procedure. The physical status of the                         patient was re-assessed after the procedure.                        After obtaining informed consent, the endoscope was                         passed under direct vision. Throughout the procedure,                         the patient's blood pressure, pulse, and oxygen                         saturations were monitored continuously. The Endoscope  was introduced through the mouth, and advanced to the                         second part of duodenum. The upper GI endoscopy was                         accomplished without difficulty. The patient tolerated                         the procedure well. Findings:      The duodenal bulb and second portion of the duodenum were normal.      Severe, diffuse portal hypertensive gastropathy was found in the entire       examined stomach.      Three columns of large (> 5 mm) varices with no bleeding and no stigmata       of recent bleeding were found in the lower third of the esophagus, 35 cm        from the incisors. No red wale signs were present. Scarring from prior       treatment was visible. Evidence of partial eradication was visible. The       varices appeared smaller than they were at prior exam. Two bands were       successfully placed with incomplete eradication of varices. There was no       bleeding during and at the end of the procedure. Impression:            - Normal duodenal bulb and second portion of the                         duodenum.                        - Portal hypertensive gastropathy.                        - Large (> 5 mm) esophageal varices with no bleeding                         and no stigmata of recent bleeding. Incompletely                         eradicated. Banded.                        - No specimens collected. Recommendation:        - Discharge patient to home (with escort).                        - Low sodium diet.                        - Continue present medications.                        - Repeat upper endoscopy in 4 weeks for endoscopic                         band ligation.                        -  Return to my office as previously scheduled. Procedure Code(s):     --- Professional ---                        [phone], Esophagogastroduodenoscopy, flexible,                         transoral; with band ligation of esophageal/gastric                         varices Diagnosis Code(s):     --- Professional ---                        K76.6, Portal hypertension                        K31.89, Other diseases of stomach and duodenum                        I85.00, Esophageal varices without bleeding CPT copyright 2019 American Medical Association. All rights reserved. The codes documented in this report are preliminary and upon coder review may  be revised to meet current compliance requirements. Dr. Ulyess Mort Lin Landsman MD, MD 05/15/2021 8:12:43 AM This report has been signed electronically. Number of Addenda: 0 Note Initiated On:  05/15/2021 7:52 AM Estimated Blood Loss:  Estimated blood loss: none.      [facility]
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
## 2021-05-15 NOTE — Anesthesia Postprocedure Evaluation (Signed)
Anesthesia Post Note  Patient: Rhonda Martin  Procedure(s) Performed: ESOPHAGOGASTRODUODENOSCOPY (EGD) WITH PROPOFOL  Patient location during evaluation: PACU Anesthesia Type: General Level of consciousness: awake and alert Pain management: pain level controlled Vital Signs Assessment: post-procedure vital signs reviewed and stable Respiratory status: spontaneous breathing, nonlabored ventilation, respiratory function stable and patient connected to nasal cannula oxygen Cardiovascular status: blood pressure returned to baseline and stable Postop Assessment: no apparent nausea or vomiting Anesthetic complications: no   No notable events documented.   Last Vitals:  Vitals:   05/15/21 0903 05/15/21 0907  BP: 98/67 102/78  Pulse: 80 80  Resp: (!) 27 20  Temp:    SpO2: 98% 97%    Last Pain:  Vitals:   05/15/21 0907  TempSrc:   PainSc: [place]

## 2021-05-16 ENCOUNTER — Encounter: Payer: Self-pay | Admitting: Gastroenterology

## 2021-05-16 ENCOUNTER — Telehealth: Payer: Self-pay

## 2021-05-16 NOTE — Telephone Encounter (Signed)
Put in 4 week recall for egd  to be done for endoscopic band ligation

## 2021-05-19 ENCOUNTER — Other Ambulatory Visit: Payer: Self-pay

## 2021-05-19 ENCOUNTER — Encounter: Payer: Self-pay | Admitting: Internal Medicine

## 2021-05-19 ENCOUNTER — Ambulatory Visit (INDEPENDENT_AMBULATORY_CARE_PROVIDER_SITE_OTHER): Payer: Medicaid Other | Admitting: Internal Medicine

## 2021-05-19 VITALS — BP 112/70 | HR 86 | Temp 98.4°F | Ht 61.81 in | Wt 153.0 lb

## 2021-05-19 DIAGNOSIS — Z89611 Acquired absence of right leg above knee: Secondary | ICD-10-CM | POA: Diagnosis not present

## 2021-05-19 DIAGNOSIS — Z975 Presence of (intrauterine) contraceptive device: Secondary | ICD-10-CM | POA: Diagnosis not present

## 2021-05-19 DIAGNOSIS — G621 Alcoholic polyneuropathy: Secondary | ICD-10-CM | POA: Diagnosis not present

## 2021-05-19 DIAGNOSIS — D61818 Other pancytopenia: Secondary | ICD-10-CM

## 2021-05-19 DIAGNOSIS — I209 Angina pectoris, unspecified: Secondary | ICD-10-CM

## 2021-05-19 DIAGNOSIS — E46 Unspecified protein-calorie malnutrition: Secondary | ICD-10-CM | POA: Diagnosis not present

## 2021-05-19 MED ORDER — FLUTICASONE PROPIONATE 50 MCG/ACT NA SUSP: 2.0000 | Freq: Every day | NASAL | 6 refills | Status: DC

## 2021-05-19 MED ORDER — AMOXICILLIN-POT CLAVULANATE 875-125 MG PO TABS: 1.0000 | ORAL_TABLET | Freq: Two times a day (BID) | ORAL | 0 refills | Status: AC

## 2021-05-19 MED ORDER — FEXOFENADINE HCL 180 MG PO TABS: 180.0000 mg | ORAL_TABLET | Freq: Every day | ORAL | 1 refills | Status: DC

## 2021-05-19 NOTE — Progress Notes (Signed)
BP 112/70   Pulse 86   Temp 98.4 F (36.9 C) (Oral)   Ht 5' 1.81" (1.57 m)   Wt 153 lb (69.4 kg)   SpO2 97%   BMI 28.16 kg/m    Subjective:    [name], female    DOB: 1975/11/06, 45 y.o.   MRN: [id number]  Chief Complaint  Patient presents with  . Referral    OB/GYN, wants IUD replaced.   . Cough  . Nasal Congestion    Has had cough and congestion for the past 3 weeks,.     HPI: Rhonda Martin is a 45 y.o. female  Pt has a ho ETOH abuse and hepatitis c , ho drug abuse has cirrhosis ? Sec to such. Pt has endoscopy x 2 has had rectal bleeding with dark stools and history of erosive esophagitis was placed on omeprazole 40 mg daily has had an EGD with banding of variceal bleeding.     Cough This is a recurrent problem. The current episode started 1 to 4 weeks ago. Associated symptoms include a fever and headaches. Pertinent negatives include no ear pain.  Fever  This is a new (102 F x tmax x 2 weeks ago has some congestion - yellowish - browinish colored phelgm) problem. The problem has been gradually improving. Her temperature was unmeasured prior to arrival. Associated symptoms include coughing and headaches. Pertinent negatives include no ear pain or muscle aches.   Chief Complaint  Patient presents with  . Referral    OB/GYN, wants IUD replaced.   . Cough  . Nasal Congestion    Has had cough and congestion for the past 3 weeks,.     Relevant past medical, surgical, family and social history reviewed and updated as indicated. Interim medical history since our last visit reviewed. Allergies and medications reviewed and updated.  Review of Systems  Constitutional:  Positive for fever.  HENT:  Negative for ear pain.   Respiratory:  Positive for cough.   Neurological:  Positive for headaches.   Per HPI unless specifically indicated above     Objective:    BP 112/70   Pulse 86   Temp 98.4 F (36.9 C) (Oral)   Ht 5' 1.81" (1.57 m)   Wt 153  lb (69.4 kg)   SpO2 97%   BMI 28.16 kg/m   Wt Readings from Last 3 Encounters:  06/03/21 146 lb (66.2 kg)  05/19/21 153 lb (69.4 kg)  05/15/21 153 lb (69.4 kg)    Physical Exam Vitals and nursing note reviewed.  Constitutional:      General: She is not in acute distress.    Appearance: Normal appearance. She is not ill-appearing or diaphoretic.  HENT:     Head: Normocephalic and atraumatic.  Eyes:     Conjunctiva/sclera: Conjunctivae normal.  Cardiovascular:     Rate and Rhythm: Normal rate and regular rhythm.     Heart sounds: No murmur heard.   No friction rub. No gallop.  Pulmonary:     Breath sounds: No rhonchi.  Abdominal:     General: Abdomen is flat. Bowel sounds are normal. There is no distension.     Palpations: Abdomen is soft. There is no mass.     Tenderness: There is no abdominal tenderness. There is no guarding or rebound.     Hernia: No hernia is present.  Musculoskeletal:        General: No swelling.  Skin:  General: Skin is warm and dry.     Coloration: Skin is not jaundiced.     Findings: No erythema.  Neurological:     General: No focal deficit present.     Mental Status: She is alert and oriented to person, place, and time.     Cranial Nerves: No cranial nerve deficit.     Sensory: No sensory deficit.  Psychiatric:        Mood and Affect: Mood normal.        Behavior: Behavior normal.        Thought Content: Thought content normal.    Results for orders placed or performed during the hospital encounter of 05/15/21  Pregnancy, urine POC  Result Value Ref Range   Preg Test, Ur NEGATIVE NEGATIVE        Current Outpatient Medications:  .  buprenorphine (BUTRANS) 5 MCG/HR PTWK, 1 patch once a week., Disp: , Rfl:  .  fexofenadine (ALLEGRA ALLERGY) 180 MG tablet, Take 1 tablet (180 mg total) by mouth daily., Disp: 10 tablet, Rfl: 1 .  fluticasone (FLONASE) 50 MCG/ACT nasal spray, Place 2 sprays into both nostrils daily., Disp: 16 g, Rfl: 6 .   gabapentin (NEURONTIN) 600 MG tablet, Take 1,200 mg by mouth 3 (three) times daily., Disp: , Rfl:  .  GAVILYTE-G 236 g solution, Take 4,000 mLs by mouth once., Disp: , Rfl:  .  OXYCODONE HCL PO, Take 5 mg by mouth 3 (three) times daily., Disp: , Rfl:  .  omeprazole (PRILOSEC) 40 MG capsule, Take 1 capsule (40 mg total) by mouth daily before breakfast., Disp: 30 capsule, Rfl: 2    Assessment & Plan:  Acute sinusitis URI: Flu and strep ordered at this visit, both negative. pt advised to take Tylenol q 4- 6 hourly as needed. pt to take allegra q pm as needed and to call office if symptoms worsened pt verbalised understanding of such.   2. 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 Ambulatory referral to Obstetrics / Gynecology     Meds ordered this encounter  Medications  . fexofenadine (ALLEGRA ALLERGY) 180 MG tablet    Sig: Take 1 tablet (180 mg total) by mouth daily.    Dispense:  10 tablet    Refill:  1  . fluticasone (FLONASE) 50 MCG/ACT nasal spray    Sig: Place 2 sprays into both nostrils daily.    Dispense:  16 g    Refill:  6  . amoxicillin-clavulanate (AUGMENTIN) 875-125 MG tablet    Sig: Take 1 tablet by mouth 2 (two) times daily for 10 days.    Dispense:  20 tablet    Refill:  0     Follow up plan: Return in about 3 months (around 08/17/2021).

## 2021-05-27 DIAGNOSIS — E559 Vitamin D deficiency, unspecified: Secondary | ICD-10-CM | POA: Diagnosis not present

## 2021-05-27 DIAGNOSIS — Z20822 Contact with and (suspected) exposure to covid-19: Secondary | ICD-10-CM | POA: Diagnosis not present

## 2021-05-27 DIAGNOSIS — Z79899 Other long term (current) drug therapy: Secondary | ICD-10-CM | POA: Diagnosis not present

## 2021-05-27 DIAGNOSIS — M129 Arthropathy, unspecified: Secondary | ICD-10-CM | POA: Diagnosis not present

## 2021-05-28 ENCOUNTER — Encounter: Payer: Self-pay | Admitting: Internal Medicine

## 2021-06-03 ENCOUNTER — Encounter: Payer: Self-pay | Admitting: Certified Nurse Midwife

## 2021-06-03 ENCOUNTER — Ambulatory Visit (INDEPENDENT_AMBULATORY_CARE_PROVIDER_SITE_OTHER): Payer: Medicaid Other | Admitting: Certified Nurse Midwife

## 2021-06-03 ENCOUNTER — Other Ambulatory Visit: Payer: Self-pay

## 2021-06-03 VITALS — BP 139/82 | HR 80 | Ht 62.0 in | Wt 146.0 lb

## 2021-06-03 DIAGNOSIS — Z30432 Encounter for removal of intrauterine contraceptive device: Secondary | ICD-10-CM

## 2021-06-03 DIAGNOSIS — Z3040 Encounter for surveillance of contraceptives, unspecified: Secondary | ICD-10-CM

## 2021-06-03 NOTE — Progress Notes (Signed)
° °  GYNECOLOGY OFFICE PROCEDURE NOTE  Rhonda Martin is a 45 y.o. No obstetric history on file. here for mirena IUD removal and reinsertion. No GYN concerns.    IUD Removal and Reinsertion  Patient identified, informed consent performed, consent signed.   Discussed risks of irregular bleeding, cramping, infection, malpositioning or misplacement of the IUD outside the uterus which may require further procedures. Also discussed >99% contraception efficacy, increased risk of ectopic pregnancy with failure of method.   Emphasized that this did not protect against STIs, condoms recommended during all sexual encounters.Advised to use backup contraception for one week as the risk of pregnancy is higher during the transition period of removing an IUD and replacing it with another one. Time out was performed. Speculum placed in the vagina. The strings of the IUD were grasped and pulled using ring forceps. The IUD was successfully removed in its entirety. The cervix was cleaned with Betadine x 2 and grasped anteriorly with a single tooth tenaculum.The the uterus sound to 8 cm;  The pt at that time cried out and asked me to stop. She asked that everything be removed and decline insertion of new IUD. I informed her that I was about to put in new IUD and she declined . Tenaculum was removed and new IUD was not placed.    Patient was given post-procedure instructions. Discussed other alternative for birth control . She declines at this time. Stating she only wanted new IUD for bleeding control . She is not worried about getting pregnant.  She was reminded to have backup contraception Follow up prn for contraception if needed. Philip Aspen, CNM

## 2021-06-03 NOTE — Patient Instructions (Signed)
Contraception Choices Contraception refers to things you do or use to prevent pregnancy. It is also called birth control. There are several methods of birth control. Talk to your doctor about the best method for you. Hormonal birth control This kind of birth control uses hormones. Here are some types of hormonal birth control: A tube that is put under the skin of your arm (implant). The tube can stay in for up to 3 years. Shots you get every 3 months. Pills you take every day. A patch you change 1 time each week for 3 weeks. After that, the patch is taken off for 1 week. A ring you put in the vagina. The ring is left in for 3 weeks. Then it is taken out of the vagina for 1 week. Then a new ring is put in. Pills you take after unprotected sex. These are called emergency birth control pills. Barrier birth control Here are some types of barrier birth control: A thin covering that is put on the penis before sex (female condom). The covering is thrown away after sex. A soft, loose covering that is put in the vagina before sex (female condom). The covering is thrown away after sex. A rubber bowl that sits over the cervix (diaphragm). The bowl must be made for you. The bowl is put into the vagina before sex. The bowl is left in for 6-8 hours after sex. It is taken out within 24 hours. A small, soft cup that fits over the cervix (cervical cap). The cup must be made for you. The cup should be left in for 6-8 hours after sex. It is taken out within 48 hours. A sponge that is put into the vagina before sex. It must be left in for at least 6 hours after sex. It must be taken out within 30 hours and thrown away. A chemical that kills or stops sperm from getting into the womb (uterus). This chemical is called a spermicide. It may be a pill, cream, jelly, or foam to put in the vagina. The chemical should be used at least 10-15 minutes before sex. IUD birth control IUD means "intrauterine device." It is put inside  the womb. There are two kinds: Hormone IUD. This kind can stay in the womb for 3-5 years. Copper IUD. This kind can stay in the womb for 10 years. Permanent birth control Here are some types of permanent birth control: Surgery to block the fallopian tubes. Having an insert put into each fallopian tube. This method takes 3 months to work. Other forms of birth control must be used for 3 months. Surgery to tie off the tubes that carry sperm in men (vasectomy). This method takes 3 months to work. Other forms of birth control must be used for 3 months. Natural planning birth control Here are some types of natural planning birth control: Not having sex on the days the woman could get pregnant. Using a calendar: To keep track of the length of each menstrual cycle. To find out what days pregnancy can happen. To plan to not have sex on days when pregnancy can happen. Watching for signs of ovulation and not having sex during this time. One way the woman can check for ovulation is to check her temperature. Waiting to have sex until after ovulation. Where to find more information Centers for Disease Control and Prevention: http://www.wolf.info/ Summary Contraception, also called birth control, refers to things you do or use to prevent pregnancy. Hormonal methods of birth control include implants,  injections, pills, patches, vaginal rings, and emergency birth control pills. Barrier methods of birth control can include female condoms, female condoms, diaphragms, cervical caps, sponges, and spermicides. There are two types of IUD (intrauterine device) birth control. An IUD can be put in a woman's womb to prevent pregnancy for several years. Permanent birth control can be done through a procedure for males, females, or both. Natural planning means not having sex when the woman could get pregnant. This information is not intended to replace advice given to you by your health care provider. Make sure you discuss any  questions you have with your health care provider. Document Revised: 10/30/2019 Document Reviewed: 10/30/2019 Elsevier Patient Education  [place].

## 2021-06-05 DIAGNOSIS — Z79899 Other long term (current) drug therapy: Secondary | ICD-10-CM | POA: Diagnosis not present

## 2021-06-26 DIAGNOSIS — Z79899 Other long term (current) drug therapy: Secondary | ICD-10-CM | POA: Diagnosis not present

## 2021-06-29 ENCOUNTER — Other Ambulatory Visit: Payer: Self-pay | Admitting: Gastroenterology

## 2021-06-29 DIAGNOSIS — K2210 Ulcer of esophagus without bleeding: Secondary | ICD-10-CM

## 2021-07-02 DIAGNOSIS — Z79899 Other long term (current) drug therapy: Secondary | ICD-10-CM | POA: Diagnosis not present

## 2021-07-14 ENCOUNTER — Emergency Department: Payer: Medicaid Other

## 2021-07-14 ENCOUNTER — Other Ambulatory Visit: Payer: Self-pay

## 2021-07-14 ENCOUNTER — Inpatient Hospital Stay
Admission: EM | Admit: 2021-07-14 | Discharge: 2021-07-16 | DRG: 190 | Disposition: A | Discharge status: Home / Self Care | Payer: Medicaid Other | Attending: Internal Medicine | Admitting: Internal Medicine

## 2021-07-14 DIAGNOSIS — F17210 Nicotine dependence, cigarettes, uncomplicated: Secondary | ICD-10-CM | POA: Diagnosis present

## 2021-07-14 DIAGNOSIS — J439 Emphysema, unspecified: Secondary | ICD-10-CM | POA: Diagnosis not present

## 2021-07-14 DIAGNOSIS — R0789 Other chest pain: Secondary | ICD-10-CM | POA: Diagnosis not present

## 2021-07-14 DIAGNOSIS — E440 Moderate protein-calorie malnutrition: Secondary | ICD-10-CM | POA: Diagnosis present

## 2021-07-14 DIAGNOSIS — R0602 Shortness of breath: Secondary | ICD-10-CM

## 2021-07-14 DIAGNOSIS — Z20822 Contact with and (suspected) exposure to covid-19: Secondary | ICD-10-CM | POA: Diagnosis present

## 2021-07-14 DIAGNOSIS — I8510 Secondary esophageal varices without bleeding: Secondary | ICD-10-CM | POA: Diagnosis present

## 2021-07-14 DIAGNOSIS — K219 Gastro-esophageal reflux disease without esophagitis: Secondary | ICD-10-CM | POA: Diagnosis present

## 2021-07-14 DIAGNOSIS — Z79899 Other long term (current) drug therapy: Secondary | ICD-10-CM | POA: Diagnosis not present

## 2021-07-14 DIAGNOSIS — M4854XA Collapsed vertebra, not elsewhere classified, thoracic region, initial encounter for fracture: Secondary | ICD-10-CM | POA: Diagnosis present

## 2021-07-14 DIAGNOSIS — Z9981 Dependence on supplemental oxygen: Secondary | ICD-10-CM | POA: Diagnosis not present

## 2021-07-14 DIAGNOSIS — R5381 Other malaise: Secondary | ICD-10-CM | POA: Diagnosis present

## 2021-07-14 DIAGNOSIS — R000 Tachycardia, unspecified: Secondary | ICD-10-CM | POA: Diagnosis present

## 2021-07-14 DIAGNOSIS — Z720 Tobacco use: Secondary | ICD-10-CM

## 2021-07-14 DIAGNOSIS — R079 Chest pain, unspecified: Secondary | ICD-10-CM

## 2021-07-14 DIAGNOSIS — J441 Chronic obstructive pulmonary disease with (acute) exacerbation: Secondary | ICD-10-CM | POA: Diagnosis not present

## 2021-07-14 DIAGNOSIS — F1010 Alcohol abuse, uncomplicated: Secondary | ICD-10-CM | POA: Diagnosis present

## 2021-07-14 DIAGNOSIS — K7460 Unspecified cirrhosis of liver: Secondary | ICD-10-CM

## 2021-07-14 DIAGNOSIS — S22080S Wedge compression fracture of T11-T12 vertebra, sequela: Secondary | ICD-10-CM

## 2021-07-14 DIAGNOSIS — R109 Unspecified abdominal pain: Secondary | ICD-10-CM | POA: Diagnosis not present

## 2021-07-14 DIAGNOSIS — J9601 Acute respiratory failure with hypoxia: Secondary | ICD-10-CM | POA: Diagnosis present

## 2021-07-14 DIAGNOSIS — I959 Hypotension, unspecified: Secondary | ICD-10-CM | POA: Diagnosis present

## 2021-07-14 DIAGNOSIS — E872 Acidosis, unspecified: Secondary | ICD-10-CM | POA: Diagnosis present

## 2021-07-14 DIAGNOSIS — D6959 Other secondary thrombocytopenia: Secondary | ICD-10-CM | POA: Diagnosis present

## 2021-07-14 DIAGNOSIS — K7030 Alcoholic cirrhosis of liver without ascites: Secondary | ICD-10-CM | POA: Diagnosis present

## 2021-07-14 DIAGNOSIS — J40 Bronchitis, not specified as acute or chronic: Secondary | ICD-10-CM

## 2021-07-14 DIAGNOSIS — D696 Thrombocytopenia, unspecified: Secondary | ICD-10-CM | POA: Diagnosis present

## 2021-07-14 DIAGNOSIS — I10 Essential (primary) hypertension: Secondary | ICD-10-CM | POA: Diagnosis present

## 2021-07-14 DIAGNOSIS — Z89611 Acquired absence of right leg above knee: Secondary | ICD-10-CM | POA: Diagnosis not present

## 2021-07-14 DIAGNOSIS — Z809 Family history of malignant neoplasm, unspecified: Secondary | ICD-10-CM | POA: Diagnosis not present

## 2021-07-14 DIAGNOSIS — Z8249 Family history of ischemic heart disease and other diseases of the circulatory system: Secondary | ICD-10-CM

## 2021-07-14 DIAGNOSIS — Z886 Allergy status to analgesic agent status: Secondary | ICD-10-CM | POA: Diagnosis not present

## 2021-07-14 DIAGNOSIS — Z6824 Body mass index (BMI) 24.0-24.9, adult: Secondary | ICD-10-CM

## 2021-07-14 DIAGNOSIS — E876 Hypokalemia: Secondary | ICD-10-CM

## 2021-07-14 DIAGNOSIS — K8020 Calculus of gallbladder without cholecystitis without obstruction: Secondary | ICD-10-CM | POA: Diagnosis not present

## 2021-07-14 DIAGNOSIS — G8929 Other chronic pain: Secondary | ICD-10-CM | POA: Diagnosis present

## 2021-07-14 DIAGNOSIS — Z8541 Personal history of malignant neoplasm of cervix uteri: Secondary | ICD-10-CM | POA: Diagnosis not present

## 2021-07-14 LAB — CBC
HCT: 45.5 % (ref 36.0–46.0)
Hemoglobin: 15.7 g/dL — ABNORMAL HIGH (ref 12.0–15.0)
MCH: 32 pg (ref 26.0–34.0)
MCHC: 34.5 g/dL (ref 30.0–36.0)
MCV: 92.9 fL (ref 80.0–100.0)
Platelets: 78 10*3/uL — ABNORMAL LOW (ref 150–400)
RBC: 4.9 MIL/uL (ref 3.87–5.11)
RDW: 14.9 % (ref 11.5–15.5)
WBC: 5.5 10*3/uL (ref 4.0–10.5)
nRBC: 0 % (ref 0.0–0.2)

## 2021-07-14 LAB — BASIC METABOLIC PANEL
Anion gap: 13 (ref 5–15)
BUN: 5 mg/dL — ABNORMAL LOW (ref 6–20)
CO2: 22 mmol/L (ref 22–32)
Calcium: 8.7 mg/dL — ABNORMAL LOW (ref 8.9–10.3)
Chloride: 101 mmol/L (ref 98–111)
Creatinine, Ser: 0.56 mg/dL (ref 0.44–1.00)
GFR, Estimated: >60 mL/min (ref >60)
Glucose, Bld: 89 mg/dL (ref 70–99)
Potassium: 3.3 mmol/L — ABNORMAL LOW (ref 3.5–5.1)
Sodium: 136 mmol/L (ref 135–145)

## 2021-07-14 LAB — POC URINE PREG, ED: Preg Test, Ur: NEGATIVE

## 2021-07-14 LAB — BLOOD GAS, VENOUS
Acid-base deficit: 1.3 mmol/L (ref 0.0–2.0)
Bicarbonate: 23.7 mmol/L (ref 20.0–28.0)
O2 Saturation: 69.3 %
Patient temperature: 37
pCO2, Ven: 40 mmHg — ABNORMAL LOW (ref 44.0–60.0)
pH, Ven: 7.38 (ref 7.250–7.430)
pO2, Ven: 37 mmHg (ref 32.0–45.0)

## 2021-07-14 LAB — HEPATIC FUNCTION PANEL
ALT: 44 U/L (ref 0–44)
AST: 71 U/L — ABNORMAL HIGH (ref 15–41)
Albumin: 3.7 g/dL (ref 3.5–5.0)
Alkaline Phosphatase: 86 U/L (ref 38–126)
Bilirubin, Direct: 0.3 mg/dL — ABNORMAL HIGH (ref 0.0–0.2)
Indirect Bilirubin: 0.6 mg/dL (ref 0.3–0.9)
Total Bilirubin: 0.9 mg/dL (ref 0.3–1.2)
Total Protein: 7.4 g/dL (ref 6.5–8.1)

## 2021-07-14 LAB — MAGNESIUM: Magnesium: 1.7 mg/dL (ref 1.7–2.4)

## 2021-07-14 LAB — TYPE AND SCREEN
ABO/RH(D): A POS
Antibody Screen: NEGATIVE

## 2021-07-14 LAB — TROPONIN I (HIGH SENSITIVITY)
Troponin I (High Sensitivity): 5 ng/L (ref <18)
Troponin I (High Sensitivity): 6 ng/L (ref <18)

## 2021-07-14 LAB — LACTIC ACID, PLASMA: Lactic Acid, Venous: 3.9 mmol/L (ref 0.5–1.9)

## 2021-07-14 LAB — RESP PANEL BY RT-PCR (FLU A&B, COVID) ARPGX2
Influenza A by PCR: NEGATIVE
Influenza B by PCR: NEGATIVE
SARS Coronavirus 2 by RT PCR: NEGATIVE

## 2021-07-14 LAB — LIPASE, BLOOD: Lipase: 26 U/L (ref 11–51)

## 2021-07-14 LAB — BRAIN NATRIURETIC PEPTIDE: B Natriuretic Peptide: 10.3 pg/mL (ref 0.0–100.0)

## 2021-07-14 LAB — PROCALCITONIN: Procalcitonin: <0.1 ng/mL

## 2021-07-14 LAB — GAMMA GT: GGT: 92 U/L — ABNORMAL HIGH (ref 7–50)

## 2021-07-14 LAB — ETHANOL: Alcohol, Ethyl (B): 78 mg/dL — ABNORMAL HIGH (ref <10)

## 2021-07-14 MED ORDER — SODIUM CHLORIDE 0.9% FLUSH
3.0000 mL | Freq: Two times a day (BID) | INTRAVENOUS | Status: DC
  Administered 2021-07-15 – 2021-07-16 (×3): 3 mL via INTRAVENOUS

## 2021-07-14 MED ORDER — PANTOPRAZOLE SODIUM 40 MG IV SOLR
40.0000 mg | Freq: Two times a day (BID) | INTRAVENOUS | Status: DC
  Administered 2021-07-14 – 2021-07-16 (×4): 40 mg via INTRAVENOUS
  Filled 2021-07-14 (×3): qty 10
  Filled 2021-07-14: qty 40

## 2021-07-14 MED ORDER — NICOTINE 21 MG/24HR TD PT24
21.0000 mg | MEDICATED_PATCH | Freq: Every day | TRANSDERMAL | Status: DC
  Administered 2021-07-15 – 2021-07-16 (×2): 21 mg via TRANSDERMAL
  Filled 2021-07-14 (×2): qty 1

## 2021-07-14 MED ORDER — IPRATROPIUM-ALBUTEROL 0.5-2.5 (3) MG/3ML IN SOLN
9.0000 mL | Freq: Once | RESPIRATORY_TRACT | Status: AC
Start: 2021-07-14 — End: 2021-07-14
  Administered 2021-07-14: 9 mL via RESPIRATORY_TRACT
  Filled 2021-07-14: qty 3

## 2021-07-14 MED ORDER — THIAMINE HCL 100 MG/ML IJ SOLN
100.0000 mg | Freq: Every day | INTRAMUSCULAR | Status: DC
  Administered 2021-07-14 – 2021-07-16 (×3): 100 mg via INTRAVENOUS
  Filled 2021-07-14 (×3): qty 2

## 2021-07-14 MED ORDER — LIDOCAINE 5 % EX PTCH
1.0000 | MEDICATED_PATCH | CUTANEOUS | Status: DC
  Administered 2021-07-14 – 2021-07-15 (×2): 1 via TRANSDERMAL
  Filled 2021-07-14 (×4): qty 1

## 2021-07-14 MED ORDER — MAGNESIUM SULFATE IN D5W 1-5 GM/100ML-% IV SOLN
1.0000 g | Freq: Once | INTRAVENOUS | Status: AC
  Administered 2021-07-14: 1 g via INTRAVENOUS
  Filled 2021-07-14: qty 100

## 2021-07-14 MED ORDER — HYDROMORPHONE HCL 1 MG/ML IJ SOLN
1.0000 mg | Freq: Once | INTRAMUSCULAR | Status: AC
Start: 2021-07-14 — End: 2021-07-14
  Administered 2021-07-14: 1 mg via INTRAVENOUS
  Filled 2021-07-14: qty 1

## 2021-07-14 MED ORDER — CHLORDIAZEPOXIDE HCL 5 MG PO CAPS
5.0000 mg | ORAL_CAPSULE | Freq: Four times a day (QID) | ORAL | Status: DC
  Administered 2021-07-14: 5 mg via ORAL
  Filled 2021-07-14: qty 1

## 2021-07-14 MED ORDER — IOHEXOL 350 MG/ML SOLN
75.0000 mL | Freq: Once | INTRAVENOUS | Status: AC | PRN
  Administered 2021-07-14: 75 mL via INTRAVENOUS

## 2021-07-14 MED ORDER — SODIUM CHLORIDE 0.9 % IV SOLN: INTRAVENOUS | Status: DC

## 2021-07-14 MED ORDER — POTASSIUM CHLORIDE CRYS ER 20 MEQ PO TBCR
40.0000 meq | EXTENDED_RELEASE_TABLET | Freq: Once | ORAL | Status: AC
  Administered 2021-07-14: 40 meq via ORAL
  Filled 2021-07-14: qty 2

## 2021-07-14 MED ORDER — IPRATROPIUM-ALBUTEROL 0.5-2.5 (3) MG/3ML IN SOLN
RESPIRATORY_TRACT | Status: AC
  Filled 2021-07-14: qty 6

## 2021-07-14 MED ORDER — OXYCODONE HCL 5 MG PO TABS
5.0000 mg | ORAL_TABLET | ORAL | Status: AC
Start: 2021-07-14 — End: 2021-07-14
  Administered 2021-07-14: 5 mg via ORAL
  Filled 2021-07-14: qty 1

## 2021-07-14 MED ORDER — OXYMETAZOLINE HCL 0.05 % NA SOLN
1.0000 | Freq: Two times a day (BID) | NASAL | Status: AC
  Administered 2021-07-14 – 2021-07-15 (×2): 1 via NASAL
  Filled 2021-07-14: qty 30

## 2021-07-14 MED ORDER — OXYCODONE HCL 5 MG PO TABS
5.0000 mg | ORAL_TABLET | Freq: Four times a day (QID) | ORAL | Status: DC | PRN
  Administered 2021-07-14 – 2021-07-16 (×7): 5 mg via ORAL
  Filled 2021-07-14 (×8): qty 1

## 2021-07-14 MED ORDER — DROPERIDOL 2.5 MG/ML IJ SOLN
2.5000 mg | Freq: Once | INTRAMUSCULAR | Status: AC
  Administered 2021-07-14: 2.5 mg via INTRAVENOUS
  Filled 2021-07-14: qty 2
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
## 2021-07-14 NOTE — Assessment & Plan Note (Signed)
IV ppi. counseling for alcohol cessation when stable.  CIWA protocol. Aspiration/ fall seizure precaution.  Thiamine.

## 2021-07-14 NOTE — Assessment & Plan Note (Signed)
05-15-2021 upper endoscopy: Severe, diffuse portal hypertensive gastropathy was found in the entire examined stomach. Three columns of large (> 5 mm) varices with no bleeding and no stigmata of recent bleeding were found in the lower third of the esophagus, 35 cm from the incisors. No red wale signs were present. Scarring from prior treatment was visible. Evidence of partial eradication was visible. The varices appeared smaller than they were at prior exam. Two bands were successfully placed with incomplete eradication of varices. There was no bleeding during and at the end of the procedure.  Due to her varices she is at high risk for bleeding. We will admit to progressive.  Type /screen/transfuse as deemed appropriate. IV ppi.  Npo.

## 2021-07-14 NOTE — ED Provider Notes (Signed)
[facility] Provider Note    Event Date/Time   First MD Initiated Contact with Patient 07/14/21 1243     (approximate)   History   Chest Pain and Shortness of Breath   HPI  Rhonda Martin is a 46 y.o. female with a past medical history of EtOH abuse drinking about 2 days/day as well as alcohol related polyneuropathy cirrhosis and esophageal varices, pancytopenia, status post right AKA, tobacco abuse, HTN, GERD, COPD, Charcot-Marie-Tooth disease, anxiety, chronic back pain on Butrans and p.o. opioids who presents for assessment of 3 to 4 days of worsening pleuritic right-sided chest pain associate with cough.  She denies any left-sided chest pain or new back pain.  She denies any fevers, vomiting, diarrhea, new abdominal pain, urinary symptoms rash or extremity pain.  Other than take deep breaths no other alleviating aggravating factors.  She did take some Tylenol's morning which she states she is not supposed to do but does not feel this helped at all.   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    Family history of adverse reaction to anesthesia    PONV   GERD (gastroesophageal reflux disease)    Hematemesis 10/23/2019   Hepatic encephalopathy 08/23/2019   Hepatitis    liver fibrosis, Hep C negative on 09/3019   Hypertension    Hypokalemia    IDA (iron deficiency anemia) 06/26/2019   IDA (iron deficiency anemia) 06/26/2019   Iron deficiency anemia    Leg injury    Liver cirrhosis (HCC)    Pneumonia    Sepsis ([place]) 07/10/2019   Symptomatic anemia 06/26/2019   Thrombocytopenia (HCC)          Physical Exam  Triage Vital Signs: ED Triage Vitals  Enc Vitals Group     BP 07/14/21 1206 140/78      Pulse Rate 07/14/21 1206 90     Resp 07/14/21 1206 18     Temp 07/14/21 1206 98 F (36.7 C)     Temp src --      SpO2 07/14/21 1206 94 %     Weight --      Height --      Head Circumference --      Peak Flow --      Pain Score 07/14/21 1155 8     Pain Loc --      Pain Edu? --      Excl. in [place]? --     Most recent vital signs: Vitals:   07/14/21 1400 07/14/21 1500  BP: 120/69 (!) 139/92  Pulse: 80 95  Resp: 12 16  Temp:    SpO2: 96% 95%    General: Awake, appears mildly uncomfortable. CV:  Good peripheral perfusion.  2+ radial pulses.  No murmurs. Resp:  Normal effort.  Minimal end expiratory wheezing without any significant increased effort. Abd:  No distention.  Soft   ED Results / Procedures / Treatments  Labs (all labs ordered are listed, but only abnormal results are displayed) Labs Reviewed  BASIC METABOLIC PANEL - Abnormal; Notable for the following components:      Result Value   Potassium 3.3 (*)    BUN 5 (*)  Calcium 8.7 (*)    All other components within normal limits  CBC - Abnormal; Notable for the following components:   Hemoglobin 15.7 (*)    Platelets 78 (*)    All other components within normal limits  RESP PANEL BY RT-PCR (FLU A&B, COVID) ARPGX2  POC URINE PREG, ED  TROPONIN I (HIGH SENSITIVITY)  TROPONIN I (HIGH SENSITIVITY)     EKG  EKG is markable sinus rhythm with a ventricular rate of 82, normal axis, unremarkable intervals without evidence of acute ischemia or significant arrhythmia.  RADIOLOGY  Chest x-ray ordered and interpreted by myself shows some bronchitic changes but no focal consolidation, effusion, edema, pneumothorax or other clear acute process.  I also reviewed radiology's interpretation and agree with their findings.  They make note of primarily noted T11 compression fracture with interval mild acute kyphosis.  CTA interpreted by myself shows no evidence of PE or pneumonia.  Also reviewed radiology interpretation and  agree with the findings of no evidence of PE or pneumonia there is evidence of emphysema.  There is also cirrhotic liver noted with sequelae of portal hypertension and splenomegaly.  No other acute thoracic process noted by radiology.   PROCEDURES:  Critical Care performed: No  .1-3 Lead EKG Interpretation Performed by: Lucrezia Starch, MD Authorized by: Lucrezia Starch, MD     Interpretation: normal     ECG rate assessment: normal     Rhythm: sinus rhythm     Ectopy: none     Conduction: normal    The patient is on the cardiac monitor to evaluate for evidence of arrhythmia and/or significant heart rate changes.   MEDICATIONS ORDERED IN ED: Medications  ipratropium-albuterol (DUONEB) 0.5-2.5 (3) MG/3ML nebulizer solution 9 mL (has no administration in time range)  droperidol (INAPSINE) 2.5 MG/ML injection 2.5 mg (has no administration in time range)  oxyCODONE (Oxy IR/ROXICODONE) immediate release tablet 5 mg (5 mg Oral Given 07/14/21 1403)  iohexol (OMNIPAQUE) 350 MG/ML injection 75 mL (75 mLs Intravenous Contrast Given 07/14/21 1450)  potassium chloride SA (KLOR-CON M) CR tablet 40 mEq (40 mEq Oral Given 07/14/21 1544)     IMPRESSION / MDM / ASSESSMENT AND PLAN / ED COURSE  I reviewed the triage vital signs and the nursing notes.                              Differential diagnosis includes, but is not limited to pathologic rib fracture, PE, pneumonia, colitis, symptomatic effusion and ACS  EKG is markable sinus rhythm with a ventricular rate of 82, normal axis, unremarkable intervals without evidence of acute ischemia or significant arrhythmia.  Initial troponin is nonelevated I will plan to obtain a repeat.  Low suspicion at this time for an occlusion MI.  CBC without leukocytosis or acute anemia.  Platelets are low consistent with patient's known history of thrombocytopenia with platelets today of 78 compared to 79 3 months ago.  COVID influenza PCR is negative.  BMP is  remarkable for K of 3.3 which was completed without any other significant electrolyte or metabolic derangements.  Chest x-ray ordered and interpreted by myself shows some bronchitic changes but no focal consolidation, effusion, edema, pneumothorax or other clear acute process.  I also reviewed radiology's interpretation and agree with their findings.  They make note of primarily noted T11 compression fracture with interval mild acute kyphosis.  CTA interpreted by myself shows no evidence of PE or  pneumonia.  Also reviewed radiology interpretation and agree with the findings of no evidence of PE or pneumonia there is evidence of emphysema.  There is also cirrhotic liver noted with sequelae of portal hypertension and splenomegaly.  No other acute thoracic process noted by radiology.  Discussed with patient's that her Butrans patch is likely making her prescribed opioids less effective.  I suspect her pain may be related to muscle strain versus bronchitis in the right lung.  She has no evidence of respiratory distress or hypoxia we will give a breathing treatment given she has some end expiratory wheezing.  She is adamant she cannot take Tylenol gabapentin ibuprofen and has minimal relief after some oxycodone but is amenable to taking her Butrans patch office made to the little bit.  I will try a dose of droperidol to see if this helps with what seems like some pain out of proportion.  Care patient signed over to assuming father approximately 4 PM.  Plan is to reassess.      FINAL CLINICAL IMPRESSION(S) / ED DIAGNOSES   Final diagnoses:  Chest pain, unspecified type  SOB (shortness of breath)  Hypokalemia  Tobacco abuse  Hepatic cirrhosis, unspecified hepatic cirrhosis type, unspecified whether ascites present (HCC)  Closed wedge compression fracture of T11 vertebra, sequela  Bronchitis     Rx / DC Orders   ED Discharge Orders     None        Note:  This document was prepared using  Dragon voice recognition software and may include unintentional dictation errors.   Lucrezia Starch, MD 07/14/21 [phone]

## 2021-07-14 NOTE — Assessment & Plan Note (Signed)
Attribute to her cirrhosis due to alcohol abuse. Will consider midodrine.

## 2021-07-14 NOTE — ED Provider Notes (Signed)
4:38 PM Assumed care for off going team.   Blood pressure (!) 139/92, pulse 95, temperature 98 F (36.7 C), resp. rate 16, SpO2 95 %.  See their HPI for full report but in brief pending re-assessment.    CT imaging did show emphysema.  Patient had been given a DuoNeb and some droperidol.  5:24 PM on repeat evaluation after the DuoNeb patient's heart rate is now up to 130 and her oxygen level is 89 to 90%.  I suspect that she is a lot of mucus plugging going on.  She reports being in significant pain.  I discussed admission for COPD therapy patient states that she would really like to go home.  We will try 1 dose of Dilaudid to see if we can help her pain level go down and help her vital signs  6:00 PM patient heart rate consistently sitting at 88 to 90%.  Patient was placed on 2 L.  Remains significantly tachycardic.  Patient reports feeling much worse after the DuoNeb.   Will admit to hospital given continued tachycardia, worsening pain.       Vanessa Los Luceros, MD 07/14/21 [phone]

## 2021-07-14 NOTE — ED Triage Notes (Signed)
Pt come with c/o SOb and some CP across chest. Pt states this started 4 days ago. Pt states her right side hurts in her lung.

## 2021-07-14 NOTE — Assessment & Plan Note (Signed)
-  Nicotine patch 

## 2021-07-14 NOTE — ED Notes (Signed)
EDP aware of pt's V/S.

## 2021-07-14 NOTE — Assessment & Plan Note (Signed)
Alcohol level. Thiamine 100 mg daily x now.  CIWA.  Magnesium x 1 now.  Aspiration/ Fall precaution.

## 2021-07-14 NOTE — Assessment & Plan Note (Addendum)
White blood cell and hemoglobin are normal patient's platelet count is at 78,000.  08/19/20 15:11 08/25/20 09:12 08/25/20 16:02 08/26/20 07:15 03/25/21 15:58 03/25/21 16:03 07/14/21 13:52  Platelets 58 (L) 68 (L) 61 (L) 68 (L) 74 (LL) 79 (LL) 78 (L)  Will avoid antiplatelets. scd on left leg rt leg has prosthetic. Consider propranolol for thrombocytopenia.

## 2021-07-14 NOTE — Assessment & Plan Note (Addendum)
D/w include hypomagnesimia form etoh abuse.  Intoxication/ volume loss/ thyroid related.  Treat underlying cause.  Hemoconcentration. Will start ns at 75 cc x 10 hour.   08/26/2020 echocardiogram:  1. Left ventricular ejection fraction, by estimation, is 60 to 65%. The  left ventricle has normal function. The left ventricle has no regional  wall motion abnormalities. Left ventricular diastolic parameters are  indeterminate.   2. Right ventricular systolic function is normal. The right ventricular  size is normal. Tricuspid regurgitation signal is inadequate for assessing  PA pressure.   3. The mitral valve is normal in structure. No evidence of mitral valve  regurgitation. No evidence of mitral stenosis.   4. The aortic valve is normal in structure. Aortic valve regurgitation is  not visualized. No aortic stenosis is present.

## 2021-07-14 NOTE — Assessment & Plan Note (Signed)
Right-sided lower chest pain suspect is related to her liver and gallbladder. We will obtain a stat liver ultrasound, GGT, lactic, question portal vein thrombosis.  Patient has a history of large varices that were banded in her endoscopy done in December.  Patient sees GI Dr. Marius Ditch. Differentials also include PUD, cholelithiasis, cholecystitis, choledocholithiasis. Low threshold for GI consult.  05-15-2021 upper endoscopy: Severe, diffuse portal hypertensive gastropathy was found in the entire examined stomach. Three columns of large (> 5 mm) varices with no bleeding and no stigmata of recent bleeding were found in the lower third of the esophagus, 35 cm from the incisors. No red wale signs were present. Scarring from prior treatment was visible. Evidence of partial eradication was visible. The varices appeared smaller than they were at prior exam. Two bands were successfully placed with incomplete eradication of varices. There was no bleeding during and at the end of the procedure.  04-15-2021 colonoscopy: The terminal ileum appeared normal. Two sessile polyps were found in the rectum. The polyps were 3 to 4 mm in size. These polyps were removed with a cold snare. Resection and retrieval were complete. Non-bleeding rectal varices were found. The exam was otherwise without abnormality.  As needed pain medication is currently held as patient's blood pressure is on the low end although her map is 77.

## 2021-07-15 LAB — CBC WITH DIFFERENTIAL/PLATELET
Abs Immature Granulocytes: 0.01 10*3/uL (ref 0.00–0.07)
Basophils Absolute: 0 10*3/uL (ref 0.0–0.1)
Basophils Relative: 1 %
Eosinophils Absolute: 0.1 10*3/uL (ref 0.0–0.5)
Eosinophils Relative: 2 %
HCT: 38.9 % (ref 36.0–46.0)
Hemoglobin: 13.6 g/dL (ref 12.0–15.0)
Immature Granulocytes: 0 %
Lymphocytes Relative: 20 %
Lymphs Abs: 0.7 10*3/uL (ref 0.7–4.0)
MCH: 32.4 pg (ref 26.0–34.0)
MCHC: 35 g/dL (ref 30.0–36.0)
MCV: 92.6 fL (ref 80.0–100.0)
Monocytes Absolute: 0.4 10*3/uL (ref 0.1–1.0)
Monocytes Relative: 10 %
Neutro Abs: 2.4 10*3/uL (ref 1.7–7.7)
Neutrophils Relative %: 67 %
Platelets: 46 10*3/uL — ABNORMAL LOW (ref 150–400)
RBC: 4.2 MIL/uL (ref 3.87–5.11)
RDW: 15 % (ref 11.5–15.5)
WBC: 3.5 10*3/uL — ABNORMAL LOW (ref 4.0–10.5)
nRBC: 0 % (ref 0.0–0.2)

## 2021-07-15 LAB — LACTIC ACID, PLASMA: Lactic Acid, Venous: 1.9 mmol/L (ref 0.5–1.9)

## 2021-07-15 LAB — PHOSPHORUS: Phosphorus: 2.3 mg/dL — ABNORMAL LOW (ref 2.5–4.6)

## 2021-07-15 LAB — COMPREHENSIVE METABOLIC PANEL
ALT: 37 U/L (ref 0–44)
AST: 57 U/L — ABNORMAL HIGH (ref 15–41)
Albumin: 3.1 g/dL — ABNORMAL LOW (ref 3.5–5.0)
Alkaline Phosphatase: 75 U/L (ref 38–126)
Anion gap: 8 (ref 5–15)
BUN: <5 mg/dL — ABNORMAL LOW (ref 6–20)
CO2: 24 mmol/L (ref 22–32)
Calcium: 7.8 mg/dL — ABNORMAL LOW (ref 8.9–10.3)
Chloride: 105 mmol/L (ref 98–111)
Creatinine, Ser: <0.3 mg/dL — ABNORMAL LOW (ref 0.44–1.00)
Glucose, Bld: 97 mg/dL (ref 70–99)
Potassium: 3.7 mmol/L (ref 3.5–5.1)
Sodium: 137 mmol/L (ref 135–145)
Total Bilirubin: 1.5 mg/dL — ABNORMAL HIGH (ref 0.3–1.2)
Total Protein: 6.3 g/dL — ABNORMAL LOW (ref 6.5–8.1)

## 2021-07-15 LAB — BETA-HYDROXYBUTYRIC ACID: Beta-Hydroxybutyric Acid: 0.64 mmol/L — ABNORMAL HIGH (ref 0.05–0.27)

## 2021-07-15 LAB — MAGNESIUM: Magnesium: 1.7 mg/dL (ref 1.7–2.4)

## 2021-07-15 MED ORDER — METHYLPREDNISOLONE SODIUM SUCC 125 MG IJ SOLR
80.0000 mg | Freq: Every day | INTRAMUSCULAR | Status: DC
  Administered 2021-07-15 – 2021-07-16 (×2): 80 mg via INTRAVENOUS
  Filled 2021-07-15 (×2): qty 2

## 2021-07-15 MED ORDER — ADULT MULTIVITAMIN W/MINERALS CH
1.0000 | ORAL_TABLET | Freq: Every day | ORAL | Status: DC
  Administered 2021-07-15 – 2021-07-16 (×2): 1 via ORAL
  Filled 2021-07-15 (×2): qty 1

## 2021-07-15 MED ORDER — GUAIFENESIN ER 600 MG PO TB12
1200.0000 mg | ORAL_TABLET | Freq: Two times a day (BID) | ORAL | Status: DC
Start: 2021-07-15 — End: 2021-07-16
  Administered 2021-07-15 – 2021-07-16 (×3): 1200 mg via ORAL
  Filled 2021-07-15 (×3): qty 2

## 2021-07-15 MED ORDER — IPRATROPIUM-ALBUTEROL 0.5-2.5 (3) MG/3ML IN SOLN: 3.0000 mL | Freq: Four times a day (QID) | RESPIRATORY_TRACT | Status: DC

## 2021-07-15 MED ORDER — CHLORDIAZEPOXIDE HCL 5 MG PO CAPS
5.0000 mg | ORAL_CAPSULE | Freq: Four times a day (QID) | ORAL | Status: DC
  Administered 2021-07-15 – 2021-07-16 (×6): 5 mg via ORAL
  Filled 2021-07-15 (×7): qty 1

## 2021-07-15 MED ORDER — AZITHROMYCIN 250 MG PO TABS
500.0000 mg | ORAL_TABLET | Freq: Every day | ORAL | Status: AC
  Administered 2021-07-15: 500 mg via ORAL
  Filled 2021-07-15: qty 2

## 2021-07-15 MED ORDER — FOLIC ACID 1 MG PO TABS
1.0000 mg | ORAL_TABLET | Freq: Every day | ORAL | Status: DC
  Administered 2021-07-15 – 2021-07-16 (×2): 1 mg via ORAL
  Filled 2021-07-15 (×2): qty 1

## 2021-07-15 MED ORDER — AZITHROMYCIN 250 MG PO TABS
250.0000 mg | ORAL_TABLET | Freq: Every day | ORAL | Status: DC
  Administered 2021-07-16: 250 mg via ORAL
  Filled 2021-07-15: qty 1

## 2021-07-15 MED ORDER — SODIUM CHLORIDE 3 % IN NEBU
4.0000 mL | INHALATION_SOLUTION | Freq: Two times a day (BID) | RESPIRATORY_TRACT | Status: DC
  Filled 2021-07-15 (×3): qty 4

## 2021-07-15 NOTE — Progress Notes (Signed)
PROGRESS NOTE  Rhonda Martin MCN:[id number] DOB: [birth year] DOA: 07/14/2021 PCP: Charlynne Cousins, MD  HPI/Recap of past 24 hours: Rhonda Martin is a 46 y.o. female with past medical history significant for alcohol abuse, alcoholic cirrhosis, esophageal varices, pancytopenia, status post right above-the-knee amputation, tobacco use disorder, hypertension, GERD, COPD, oxygen supplementation at baseline, Charcot-Marie-Tooth disease, chronic anxiety, chronic back pain, presented to [facility] ED with complaints of chest pain and shortness of breath of 5 days duration.  She presented to the ED for further evaluation.  Work-up revealed acute hypoxic respiratory failure secondary to acute COPD exacerbation, concern for alcohol withdrawal.  07/15/2021: Patient was seen and examined at her bedside.  She reports severe middle back pain.  Imaging showed T11 vertebral fracture.  IR was consulted for possible vertebroplasty however felt that the fracture was old therefore was not a candidate for vertebroplasty.    Assessment/Plan: Principal Problem:   Chest pain, non-cardiac Active Problems:   Thrombocytopenia (HCC)   Alcohol abuse   Tobacco abuse   Sinus tachycardia   Esophageal varices in cirrhosis (HCC)   Hypotension   Chest pain   Alcoholic cirrhosis of liver without ascites (HCC)  Pleuritic chest pain- (present on admission) Worse with coughing and taking a deep breath, reproducible with palpation. Troponin negative x2.  Acute COPD exacerbation, unclear trigger COVID-19 screening test, influenza A, influenza B, negative Started on IV Solu-Medrol 4 mg twice daily, bronchodilators, pulmonary toilet. Maintain oxygen saturation greater than 90%.  Acute hypoxic respiratory failure secondary to acute COPD exacerbation Not on oxygen supplementation at baseline Currently requiring 2 L to maintain oxygen saturation greater than 90% Weaning off O2 supplementation as tolerated.  Alcoholic cirrhosis, followed  by GI. Patient has a history of large varices that were banded in December.   Patient sees GI Dr. Marius Ditch. No significant ascites at the time of this visit. Alcohol cessation counseling Avoid hepatotoxic agents.  Alcohol use disorder with concern for withdrawal Last alcohol intake was 2 days ago. Continue CIWA protocol. Aspiration/fall/seizure precautions Continue multivitamins, thiamine and folic acid supplementation.  Resolved transient hypotension Continue to closely monitor vital signs and maintain MAP greater than 65.   Esophageal varices in cirrhosis ([place])- (present on admission) 05-15-2021 upper endoscopy: Severe, diffuse portal hypertensive gastropathy was found in the entire examined stomach. Three columns of large (> 5 mm) varices with no bleeding and no stigmata of recent bleeding were found in the lower third of the esophagus, 35 cm from the incisors. No red wale signs were present. Scarring from prior treatment was visible. Evidence of partial eradication was visible. The varices appeared smaller than they were at prior exam. Two bands were successfully placed with incomplete eradication of varices. There was no bleeding during and at the end of the procedure. Continue IV Protonix 40 mg twice daily. Monitor for evidence of overt bleeding.  Chronic thrombocytopenia in the setting of cirrhosis. No overt bleeding at the time of this visit Platelet count 46 from 78 Continue to closely monitor, repeat CBC in the morning.   Resolved sinus tachycardia- (present on admission) Received gentle IV fluid hydration on admission. Off IV fluid hydration.   Tobacco abuse- (present on admission) Continue nicotine patch.    Moderate protein calorie malnutrition Moderate muscle mass loss Encourage increasing oral protein calorie intake.  Physical debility PT OT to assess Fall precautions   Critical care time: 65 minutes.      Code Status: Full code  Family Communication:  None at the  bedside.  Disposition Plan: Likely will discharge to home with home health services.   Consultants: None  Procedures: None.  Antimicrobials: Azithromycin  DVT prophylaxis: SCDs, pharmacological DVT prophylaxis is contraindicated with platelet count less than 50,000.  Status is: Inpatient  Patient requires at least 2 midnights for further evaluation and treatment of present condition.      Objective: Vitals:   07/15/21 0700 07/15/21 0800 07/15/21 0909 07/15/21 1126  BP: 113/74 118/71 (!) 159/81 117/62  Pulse: 88 91 86 86  Resp: 11 12    Temp:  98.2 F (36.8 C) 98.6 F (37 C) 98 F (36.7 C)  TempSrc:  Oral Oral   SpO2: 95% 95% 97% 95%    Intake/Output Summary (Last 24 hours) at 07/15/2021 1357 Last data filed at 07/15/2021 1239 Gross per 24 hour  Intake 779.8 ml  Output 501 ml  Net 278.8 ml   There were no vitals filed for this visit.  Exam:  General: 46 y.o. year-old female well developed well nourished in no acute distress.  Alert and oriented x3. Cardiovascular: Regular rate and rhythm with no rubs or gallops.  No thyromegaly or JVD noted.   Respiratory: Clear to auscultation with no wheezes or rales. Good inspiratory effort. Abdomen: Soft nontender nondistended with normal bowel sounds x4 quadrants. Musculoskeletal: No lower extremity edema. 2/4 pulses in all 4 extremities. Skin: No ulcerative lesions noted or rashes, Psychiatry: Mood is appropriate for condition and setting   Data Reviewed: CBC: Recent Labs  Lab 07/14/21 1352 07/15/21 0736  WBC 5.5 3.5*  NEUTROABS  --  2.4  HGB 15.7* 13.6  HCT 45.5 38.9  MCV 92.9 92.6  PLT 78* 46*   Basic Metabolic Panel: Recent Labs  Lab 07/14/21 1352 07/14/21 1540 07/15/21 0656  NA 136  --  137  K 3.3*  --  3.7  CL 101  --  105  CO2 22  --  24  GLUCOSE 89  --  97  BUN 5*  --  <5*  CREATININE 0.56  --  <0.30*  CALCIUM 8.7*  --  7.8*  MG  --  1.7 1.7  PHOS  --   --  2.3*   GFR: CrCl  cannot be calculated (This lab value cannot be used to calculate CrCl because it is not a number: <0.30). Liver Function Tests: Recent Labs  Lab 07/14/21 1903 07/15/21 0656  AST 71* 57*  ALT 44 37  ALKPHOS 86 75  BILITOT 0.9 1.5*  PROT 7.4 6.3*  ALBUMIN 3.7 3.1*   Recent Labs  Lab 07/14/21 1903  LIPASE 26   No results for input(s): AMMONIA in the last 168 hours. Coagulation Profile: No results for input(s): INR, PROTIME in the last 168 hours. Cardiac Enzymes: No results for input(s): CKTOTAL, CKMB, CKMBINDEX, TROPONINI in the last 168 hours. BNP (last 3 results) No results for input(s): PROBNP in the last 8760 hours. HbA1C: No results for input(s): HGBA1C in the last 72 hours. CBG: No results for input(s): GLUCAP in the last 168 hours. Lipid Profile: No results for input(s): CHOL, HDL, LDLCALC, TRIG, CHOLHDL, LDLDIRECT in the last 72 hours. Thyroid Function Tests: No results for input(s): TSH, T4TOTAL, FREET4, T3FREE, THYROIDAB in the last 72 hours. Anemia Panel: No results for input(s): VITAMINB12, FOLATE, FERRITIN, TIBC, IRON, RETICCTPCT in the last 72 hours. Urine analysis:    Component Value Date/Time   COLORURINE YELLOW (A) 08/25/2020 0912   APPEARANCEUR HAZY (A) 08/25/2020 0912   APPEARANCEUR Clear 05/13/2020  1547   LABSPEC 1.029 08/25/2020 0912   PHURINE 7.0 08/25/2020 0912   GLUCOSEU NEGATIVE 08/25/2020 0912   HGBUR NEGATIVE 08/25/2020 0912   BILIRUBINUR NEGATIVE 08/25/2020 0912   BILIRUBINUR Negative 05/13/2020 1547   KETONESUR NEGATIVE 08/25/2020 0912   PROTEINUR NEGATIVE 08/25/2020 0912   NITRITE NEGATIVE 08/25/2020 0912   LEUKOCYTESUR TRACE (A) 08/25/2020 0912   Sepsis Labs: @LABRCNTIP (procalcitonin:4,lacticidven:4)  ) Recent Results (from the past 240 hour(s))  Resp Panel by RT-PCR (Flu A&B, Covid) Nasopharyngeal Swab     Status: None   Collection Time: 07/14/21  1:52 PM   Specimen: Nasopharyngeal Swab; Nasopharyngeal(NP) swabs in vial transport  medium  Result Value Ref Range Status   SARS Coronavirus 2 by RT PCR NEGATIVE NEGATIVE Final    Comment: (NOTE) SARS-CoV-2 target nucleic acids are NOT DETECTED.  The SARS-CoV-2 RNA is generally detectable in upper respiratory specimens during the acute phase of infection. The lowest concentration of SARS-CoV-2 viral copies this assay can detect is 138 copies/mL. A negative result does not preclude SARS-Cov-2 infection and should not be used as the sole basis for treatment or other patient management decisions. A negative result may occur with  improper specimen collection/handling, submission of specimen other than nasopharyngeal swab, presence of viral mutation(s) within the areas targeted by this assay, and inadequate number of viral copies(<138 copies/mL). A negative result must be combined with clinical observations, patient history, and epidemiological information. The expected result is Negative.  Fact Sheet for Patients:  EntrepreneurPulse.com.au  Fact Sheet for Healthcare Providers:  IncredibleEmployment.be  This test is no t yet approved or cleared by the [place] FDA and  has been authorized for detection and/or diagnosis of SARS-CoV-2 by FDA under an Emergency Use Authorization (EUA). This EUA will remain  in effect (meaning this test can be used) for the duration of the COVID-19 declaration under Section 564(b)(1) of the Act, 21 U.S.C.section 360bbb-3(b)(1), unless the authorization is terminated  or revoked sooner.       Influenza A by PCR NEGATIVE NEGATIVE Final   Influenza B by PCR NEGATIVE NEGATIVE Final    Comment: (NOTE) The Xpert Xpress SARS-CoV-2/FLU/RSV plus assay is intended as an aid in the diagnosis of influenza from Nasopharyngeal swab specimens and should not be used as a sole basis for treatment. Nasal washings and aspirates are unacceptable for Xpert Xpress SARS-CoV-2/FLU/RSV testing.  Fact Sheet for  Patients: EntrepreneurPulse.com.au  Fact Sheet for Healthcare Providers: IncredibleEmployment.be  This test is not yet approved or cleared by the [place] FDA and has been authorized for detection and/or diagnosis of SARS-CoV-2 by FDA under an Emergency Use Authorization (EUA). This EUA will remain in effect (meaning this test can be used) for the duration of the COVID-19 declaration under Section 564(b)(1) of the Act, 21 U.S.C. section 360bbb-3(b)(1), unless the authorization is terminated or revoked.  Performed at [facility], [address] [place], [place] [address]       Studies: CT Angio Chest PE W and/or Wo Contrast  Result Date: 07/14/2021 CLINICAL DATA:  Shortness of breath EXAM: CT ANGIOGRAPHY CHEST WITH CONTRAST TECHNIQUE: Multidetector CT imaging of the chest was performed using the standard protocol during bolus administration of intravenous contrast. Multiplanar CT image reconstructions and MIPs were obtained to evaluate the vascular anatomy. RADIATION DOSE REDUCTION: This exam was performed according to the departmental dose-optimization program which includes automated exposure control, adjustment of the mA and/or kV according to patient size and/or use of iterative reconstruction technique. CONTRAST:  33mL OMNIPAQUE IOHEXOL 350 MG/ML SOLN COMPARISON:  CT chest angio dated August 25, 2020 FINDINGS: Cardiovascular: Satisfactory opacification of the pulmonary arteries with no evidence of pulmonary embolus to the level of the subsegmental pulmonary arteries. Normal heart size. No pericardial effusion. No significant coronary artery calcifications. Normal caliber thoracic aorta with no significant atherosclerotic disease. Limited evaluation of the aortic root due to cardiac motion. Mediastinum/Nodes: Esophagus and thyroid are unremarkable. No pathologically enlarged lymph nodes seen in the chest. Lungs/Pleura: Central airways are  patent. Upper lobe predominant centrilobular emphysema. No consolidation, pleural effusion or pneumothorax. Upper Abdomen: Cirrhotic liver morphology with splenomegaly and upper abdominal varices. No acute abnormality. Musculoskeletal: No chest wall abnormality. Unchanged mild compression deformities. No acute or significant osseous findings. Review of the MIP images confirms the above findings. IMPRESSION: 1. No evidence pulmonary embolus. 2. Cirrhotic liver morphology with sequela of portal hypertension including abdominal varices and splenomegaly. 3.  Emphysema (ICD10-J43.9). Electronically Signed   By: Yetta Glassman M.D.   On: 07/14/2021 15:04   US Abdomen Limited RUQ (LIVER/GB)  Result Date: 07/14/2021 CLINICAL DATA:  Abdominal pain for 4 days EXAM: ULTRASOUND ABDOMEN LIMITED RIGHT UPPER QUADRANT COMPARISON:  None. FINDINGS: Gallbladder: Cholelithiasis with 1 cm gallstone. No gallbladder wall thickening or edema. Murphy's sign is negative. Common bile duct: Diameter: 2 mm, normal Liver: Heterogeneous increased parenchymal echotexture with nodular contour suggesting hepatic cirrhosis. No focal lesions are identified. Portal vein is patent on color Doppler imaging with normal direction of blood flow towards the liver. Other: None. IMPRESSION: 1. Cholelithiasis without additional signs of acute cholecystitis. 2. Heterogeneous liver parenchymal echotexture with nodular contour suggesting a [place] cirrhosis. Electronically Signed   By: Lucienne Capers M.D.   On: 07/14/2021 19:23    Scheduled Meds:  [START ON 07/16/2021] azithromycin  250 mg Oral Daily   chlordiazePOXIDE  5 mg Oral Q6H   guaiFENesin  1,200 mg Oral BID   ipratropium-albuterol  3 mL Nebulization Q6H   lidocaine  1 patch Transdermal Q24H   methylPREDNISolone (SOLU-MEDROL) injection  80 mg Intravenous Daily   nicotine  21 mg Transdermal Daily   pantoprazole (PROTONIX) IV  40 mg Intravenous Q12H   sodium chloride flush  3 mL Intravenous  Q12H   sodium chloride HYPERTONIC  4 mL Nebulization BID   thiamine injection  100 mg Intravenous Daily    Continuous Infusions:   LOS: 1 day     Kayleen Memos, MD [facility] Pager [phone]  If 7PM-7AM, please contact night-coverage [email or web address] Password [email or web address] 07/15/2021, 1:57 PM

## 2021-07-15 NOTE — ED Notes (Signed)
Patient sleeping at this time. NAD noted. Respirations even and unlabored. 

## 2021-07-15 NOTE — Progress Notes (Signed)
IR received request to evaluate this patient for a T11 kyphoplasty. Imaging reviewed by Dr. Annamaria Boots. T11 compression fracture appears old/chronic. Patient is not a candidate for a T11 kyphoplasty with IR. Dr. Nevada Crane notified. No IR procedure planned and the order will be deleted.  Soyla Dryer, [place] [phone] 07/15/2021, 12:55 PM

## 2021-07-15 NOTE — ED Notes (Signed)
Lindsay RN aware of assigned bed 

## 2021-07-15 NOTE — ED Notes (Signed)
Pt assisted to restroom at this time with 1+ assisted from this RN.

## 2021-07-15 NOTE — Plan of Care (Signed)

## 2021-07-15 NOTE — Evaluation (Signed)
Physical Therapy Evaluation Patient Details Name: Rhonda Martin MRN: [id number] DOB: Jun 26, 1975 Today's Date: 07/15/2021  History of Present Illness  Rhonda Martin is a 46 y.o. female with past medical history significant for alcohol abuse, alcoholic cirrhosis, esophageal varices, pancytopenia, right AKA, tobacco use disorder, hypertension, GERD, COPD, Charcot-Marie-Tooth disease, chronic anxiety, chronic back pain, who presented to [facility] ED with complaints of chest pain and shortness of breath of 5 days duration. Work-up revealed acute hypoxic respiratory failure secondary to acute COPD exacerbation and concern for alcohol withdrawal.   Clinical Impression  Pt received sitting upright in bed. She states that she is very active, has had PT in the past (following AKA) and does not need PT currently. PT asks for pt participation, pt agreeable. She lives with her husband and is fully independent at baseline. Pt demonstrates ability to perform all mobility Mod I. RN in room to observe. She used crutches for ambulation with proper technique. PT did encourage more frequent mobility through duration of hospital stay as pt stated this was her first time ambulating since she arrived to the hospital. Pt has all necessary DME and does not require further therapy. PT to sign off at this time. Please re-consult if pt status changes.      Recommendations for follow up therapy are one component of a multi-disciplinary discharge planning process, led by the attending physician.  Recommendations may be updated based on patient status, additional functional criteria and insurance authorization.  Follow Up Recommendations No PT follow up    Assistance Recommended at Discharge PRN  Patient can return home with the following  Assistance with cooking/housework;Assist for transportation;Help with stairs or ramp for entrance    Equipment Recommendations None recommended by PT  Recommendations for Other Services        Functional Status Assessment Patient has had a recent decline in their functional status and demonstrates the ability to make significant improvements in function in a reasonable and predictable amount of time.     Precautions / Restrictions Precautions Precautions: None Restrictions Weight Bearing Restrictions: No      Mobility  Bed Mobility Overal bed mobility: Modified Independent                  Transfers Overall transfer level: Modified independent Equipment used: None               General transfer comment: holding onto bed rail for stability    Ambulation/Gait Ambulation/Gait assistance: Modified independent (Device/Increase time) Gait Distance (Feet): 30 Feet Assistive device: Crutches         General Gait Details: Demonstrated using bilateral crutches and no prosthesis. Swing through pattern. Good technique and stability.  Stairs            Wheelchair Mobility    Modified Rankin (Stroke Patients Only)       Balance Overall balance assessment: No apparent balance deficits (not formally assessed)                                           Pertinent Vitals/Pain Pain Assessment Pain Assessment: Faces Faces Pain Scale: Hurts little more Pain Location: R chest    Home Living Family/patient expects to be discharged to:: Private residence Living Arrangements: Spouse/significant other                      Prior  Function Prior Level of Function : Independent/Modified Independent             Mobility Comments: Pt states she is very active and fully independent at baseline. She uses crutches for ambulation (bilateral w/o prosthesis and unilateral w/ prosthesis).       Hand Dominance        Extremity/Trunk Assessment   Upper Extremity Assessment Upper Extremity Assessment: Overall WFL for tasks assessed    Lower Extremity Assessment Lower Extremity Assessment: RLE deficits/detail;Overall WFL for  tasks assessed (LLE WFL) RLE Deficits / Details: R AKA       Communication      Cognition Arousal/Alertness: Awake/alert Behavior During Therapy: WFL for tasks assessed/performed Overall Cognitive Status: Within Functional Limits for tasks assessed                                 General Comments: very talkative        General Comments      Exercises     Assessment/Plan    PT Assessment Patient does not need any further PT services  PT Problem List Decreased mobility;Decreased activity tolerance;Cardiopulmonary status limiting activity       PT Treatment Interventions      PT Goals (Current goals can be found in the Care Plan section)  Acute Rehab PT Goals Patient Stated Goal: to go home tomorrow PT Goal Formulation: With patient Time For Goal Achievement: 07/29/21 Potential to Achieve Goals: Good    Frequency       Co-evaluation               AM-PAC PT "6 Clicks" Mobility  Outcome Measure Help needed turning from your back to your side while in a flat bed without using bedrails?: None Help needed moving from lying on your back to sitting on the side of a flat bed without using bedrails?: None Help needed moving to and from a bed to a chair (including a wheelchair)?: None Help needed standing up from a chair using your arms (e.g., wheelchair or bedside chair)?: None Help needed to walk in hospital room?: None Help needed climbing 3-5 steps with a railing? : A Little 6 Click Score: 23    End of Session   Activity Tolerance: Patient tolerated treatment well Patient left: in bed;with nursing/sitter in room Nurse Communication: Mobility status PT Visit Diagnosis: Muscle weakness (generalized) (M62.81);Unsteadiness on feet (R26.81)    Time: 7902-4097 PT Time Calculation (min) (ACUTE ONLY): 14 min   Charges:   PT Evaluation $PT Eval Moderate Complexity: 1 Mod          Marcanthony Sleight PT, DPT 07/15/21 5:07 PM [phone]

## 2021-07-16 LAB — COMPREHENSIVE METABOLIC PANEL
ALT: 38 U/L (ref 0–44)
AST: 54 U/L — ABNORMAL HIGH (ref 15–41)
Albumin: 3.6 g/dL (ref 3.5–5.0)
Alkaline Phosphatase: 83 U/L (ref 38–126)
Anion gap: 5 (ref 5–15)
BUN: 8 mg/dL (ref 6–20)
CO2: 24 mmol/L (ref 22–32)
Calcium: 9 mg/dL (ref 8.9–10.3)
Chloride: 106 mmol/L (ref 98–111)
Creatinine, Ser: <0.3 mg/dL — ABNORMAL LOW (ref 0.44–1.00)
Glucose, Bld: 169 mg/dL — ABNORMAL HIGH (ref 70–99)
Potassium: 3.9 mmol/L (ref 3.5–5.1)
Sodium: 135 mmol/L (ref 135–145)
Total Bilirubin: 1.5 mg/dL — ABNORMAL HIGH (ref 0.3–1.2)
Total Protein: 8 g/dL (ref 6.5–8.1)

## 2021-07-16 LAB — CBC WITH DIFFERENTIAL/PLATELET
Abs Immature Granulocytes: 0.02 10*3/uL (ref 0.00–0.07)
Basophils Absolute: 0 10*3/uL (ref 0.0–0.1)
Basophils Relative: 0 %
Eosinophils Absolute: 0 10*3/uL (ref 0.0–0.5)
Eosinophils Relative: 0 %
HCT: 44 % (ref 36.0–46.0)
Hemoglobin: 15.2 g/dL — ABNORMAL HIGH (ref 12.0–15.0)
Immature Granulocytes: 0 %
Lymphocytes Relative: 10 %
Lymphs Abs: 0.6 10*3/uL — ABNORMAL LOW (ref 0.7–4.0)
MCH: 31.9 pg (ref 26.0–34.0)
MCHC: 34.5 g/dL (ref 30.0–36.0)
MCV: 92.2 fL (ref 80.0–100.0)
Monocytes Absolute: 0.5 10*3/uL (ref 0.1–1.0)
Monocytes Relative: 8 %
Neutro Abs: 4.9 10*3/uL (ref 1.7–7.7)
Neutrophils Relative %: 82 %
Platelets: 51 10*3/uL — ABNORMAL LOW (ref 150–400)
RBC: 4.77 MIL/uL (ref 3.87–5.11)
RDW: 14.4 % (ref 11.5–15.5)
WBC: 6.1 10*3/uL (ref 4.0–10.5)
nRBC: 0 % (ref 0.0–0.2)

## 2021-07-16 MED ORDER — LIDOCAINE 5 % EX PTCH: 1.0000 | MEDICATED_PATCH | CUTANEOUS | 0 refills | Status: AC

## 2021-07-16 MED ORDER — GUAIFENESIN ER 600 MG PO TB12: 1200.0000 mg | ORAL_TABLET | Freq: Two times a day (BID) | ORAL | 0 refills | Status: DC | PRN

## 2021-07-16 MED ORDER — ALBUTEROL SULFATE HFA 108 (90 BASE) MCG/ACT IN AERS: 2.0000 | INHALATION_SPRAY | Freq: Four times a day (QID) | RESPIRATORY_TRACT | 2 refills | Status: DC | PRN

## 2021-07-16 MED ORDER — AZITHROMYCIN 250 MG PO TABS: ORAL_TABLET | ORAL | 0 refills | Status: DC

## 2021-07-16 MED ORDER — IPRATROPIUM-ALBUTEROL 0.5-2.5 (3) MG/3ML IN SOLN: 3.0000 mL | Freq: Four times a day (QID) | RESPIRATORY_TRACT | Status: DC | PRN

## 2021-07-16 MED ORDER — PREDNISONE 50 MG PO TABS: ORAL_TABLET | ORAL | 0 refills | Status: DC

## 2021-07-16 MED ORDER — FOLIC ACID 1 MG PO TABS: 1.0000 mg | ORAL_TABLET | Freq: Every day | ORAL | 1 refills | Status: DC

## 2021-07-16 MED ORDER — ADULT MULTIVITAMIN W/MINERALS CH: 1.0000 | ORAL_TABLET | Freq: Every day | ORAL | 1 refills | Status: AC

## 2021-07-16 NOTE — Progress Notes (Signed)
OT Cancellation Note  Patient Details Name: Rhonda Martin MRN: [id number] DOB: 07-23-1975   Cancelled Treatment:    Reason Eval/Treat Not Completed: OT screened, no needs identified, will sign off. OT order received and chart reviewed. Per chart review, pt is mod I with crutches during PT session. Pt verbalized being at baseline and able to perform all ADLs independently. OT to SIGN OFF at this time.   Darleen Crocker, MS, OTR/L , CBIS ascom [phone]  07/16/21, 10:48 AM

## 2021-07-16 NOTE — Progress Notes (Signed)
°  Transition of Care [facility]) Screening Note   Patient Details  Name: Rhonda Martin Date of Birth: [birth year]   [facility]) CM/SW Contact:    Alberteen Sam, LCSW Phone Number: 07/16/2021, 9:02 AM    Transition of Care Department [facility] has reviewed patient and no TOC needs have been identified at this time. We will continue to monitor patient advancement through interdisciplinary progression rounds. If new patient transition needs arise, please place a TOC consult.  [place], [place]

## 2021-07-16 NOTE — Discharge Summary (Signed)
Physician Discharge Summary  Rhonda Martin FTD:[id number] DOB: 1975/11/19 DOA: 07/14/2021  PCP: Charlynne Cousins, MD  Admit date: 07/14/2021 Discharge date: 07/16/2021  Admitted From: Home Disposition: Home  Recommendations for Outpatient Follow-up:  Follow up with PCP in 1-2 weeks Please obtain BMP/CBC in one week Please follow up on the following pending results: None  Home Health: No Equipment/Devices: Discharge Condition: Stable CODE STATUS: Full Diet recommendation: Heart Healthy    Brief/Interim Summary: Rhonda Martin is a 46 y.o. female with past medical history significant for alcohol abuse, alcoholic cirrhosis, esophageal varices, pancytopenia, status post right above-the-knee amputation, tobacco use disorder, hypertension, GERD, COPD, oxygen supplementation at baseline, Charcot-Marie-Tooth disease, chronic anxiety, chronic back pain, presented to [facility] ED with complaints of chest pain and shortness of breath of 5 days duration.  She presented to the ED for further evaluation.  Work-up revealed acute hypoxic respiratory failure secondary to acute COPD exacerbation, treated with steroid and Zithromax.  Responded well and appears to be at baseline before discharge.  She was discharged home for 3 more days of Zithromax and prednisone.  She was also having middle back pain, imaging shows T11 vertebral compression fracture.  IR was consulted for possible kyphoplasty but it was felt that that this is a chronic fracture and she was not a candidate for any procedure.  Lidocaine patches was used for pain control.  Her chest pain was thought to be due to COPD as it appears to be pleuritic in nature.  Troponin remained negative.  Patient has an history of alcoholic cirrhosis with esophageal varices which were banded in December.  No acute concern and she will continue follow-up with her GI.  Alcohol cessation counseling was again provided.  Patient has an history of chronic thrombocytopenia  secondary to cirrhosis which seems stable.  She will continue with outpatient follow-up with her providers.  Patient will continue with the rest of her home medications and follow-up with her providers.  Discharge Diagnoses:  Principal Problem:   Chest pain, non-cardiac Active Problems:   Thrombocytopenia (HCC)   Alcohol abuse   Tobacco abuse   Sinus tachycardia   Esophageal varices in cirrhosis (HCC)   Hypotension   Chest pain   Alcoholic cirrhosis of liver without ascites [facility])   Discharge Instructions  Discharge Instructions     Diet - low sodium heart healthy   Complete by: As directed    Discharge instructions   Complete by: As directed    It was pleasure taking care of you. You are being given Zithromax and prednisone for 3 more days, please take it as directed. You are also being given an inhaler to be used if you feel short of breath. Please follow-up with your primary care doctor for further recommendations.   Increase activity slowly   Complete by: As directed       Allergies as of 07/16/2021       Reactions   Tylenol [acetaminophen] Other (See Comments)   Liver disease        Medication List     STOP taking these medications    fexofenadine 180 MG tablet Commonly known as: Allegra Allergy   fluticasone 50 MCG/ACT nasal spray Commonly known as: FLONASE   gabapentin 600 MG tablet Commonly known as: NEURONTIN   GaviLyte-G 236 g solution Generic drug: polyethylene glycol       TAKE these medications    albuterol 108 (90 Base) MCG/ACT inhaler Commonly known as: VENTOLIN HFA Inhale 2 puffs into  the lungs every 6 (six) hours as needed for wheezing or shortness of breath.   azithromycin 250 MG tablet Commonly known as: ZITHROMAX 1 tablet daily for the next 3 days. Start taking on: July 17, 2021   buprenorphine 5 MCG/HR Ptwk Commonly known as: BUTRANS 1 patch once a week.   folic acid 1 MG tablet Commonly known as: FOLVITE Take 1  tablet (1 mg total) by mouth daily. Start taking on: July 17, 2021   guaiFENesin 600 MG 12 hr tablet Commonly known as: MUCINEX Take 2 tablets (1,200 mg total) by mouth 2 (two) times daily as needed for to loosen phlegm or cough.   lidocaine 5 % Commonly known as: LIDODERM Place 1 patch onto the skin daily. Remove & Discard patch within 12 hours or as directed by MD   multivitamin with minerals Tabs tablet Take 1 tablet by mouth daily. Start taking on: July 17, 2021   omeprazole 40 MG capsule Commonly known as: PRILOSEC TAKE 1 CAPSULE BY MOUTH EVERY DAY BEFORE BREAKFAST What changed: See the new instructions.   oxyCODONE 5 MG immediate release tablet Commonly known as: Oxy IR/ROXICODONE Take 5 mg by mouth 3 (three) times daily as needed for pain. What changed: Another medication with the same name was removed. Continue taking this medication, and follow the directions you see here.   predniSONE 50 MG tablet Commonly known as: DELTASONE Take 1 tablet daily for the next 3 days        Allergies  Allergen Reactions   Tylenol [Acetaminophen] Other (See Comments)    Liver disease    Consultations: None  Procedures/Studies: DG Chest 2 View  Result Date: 07/14/2021 CLINICAL DATA:  Chest pain and shortness of breath for the past 4 days. Smoker. EXAM: CHEST - 2 VIEW COMPARISON:  08/25/2020.  Chest CTA dated 08/25/2020. FINDINGS: Normal sized heart. Clear lungs. Mild diffuse peribronchial thickening with mild progression. Previously demonstrated chronic T11 vertebral body compression deformity, possibly slightly increased with interval mild acute kyphosis. IMPRESSION: 1. Mildly progressive bronchitic changes. 2. Possible mild increase in degree of compression of the T11 vertebral body with interval mild acute kyphosis. Electronically Signed   By: Claudie Revering M.D.   On: 07/14/2021 12:42   CT Angio Chest PE W and/or Wo Contrast  Result Date: 07/14/2021 CLINICAL DATA:   Shortness of breath EXAM: CT ANGIOGRAPHY CHEST WITH CONTRAST TECHNIQUE: Multidetector CT imaging of the chest was performed using the standard protocol during bolus administration of intravenous contrast. Multiplanar CT image reconstructions and MIPs were obtained to evaluate the vascular anatomy. RADIATION DOSE REDUCTION: This exam was performed according to the departmental dose-optimization program which includes automated exposure control, adjustment of the mA and/or kV according to patient size and/or use of iterative reconstruction technique. CONTRAST:  26mL OMNIPAQUE IOHEXOL 350 MG/ML SOLN COMPARISON:  CT chest angio dated August 25, 2020 FINDINGS: Cardiovascular: Satisfactory opacification of the pulmonary arteries with no evidence of pulmonary embolus to the level of the subsegmental pulmonary arteries. Normal heart size. No pericardial effusion. No significant coronary artery calcifications. Normal caliber thoracic aorta with no significant atherosclerotic disease. Limited evaluation of the aortic root due to cardiac motion. Mediastinum/Nodes: Esophagus and thyroid are unremarkable. No pathologically enlarged lymph nodes seen in the chest. Lungs/Pleura: Central airways are patent. Upper lobe predominant centrilobular emphysema. No consolidation, pleural effusion or pneumothorax. Upper Abdomen: Cirrhotic liver morphology with splenomegaly and upper abdominal varices. No acute abnormality. Musculoskeletal: No chest wall abnormality. Unchanged mild compression deformities. No  acute or significant osseous findings. Review of the MIP images confirms the above findings. IMPRESSION: 1. No evidence pulmonary embolus. 2. Cirrhotic liver morphology with sequela of portal hypertension including abdominal varices and splenomegaly. 3.  Emphysema (ICD10-J43.9). Electronically Signed   By: Yetta Glassman M.D.   On: 07/14/2021 15:04   US Abdomen Limited RUQ (LIVER/GB)  Result Date: 07/14/2021 CLINICAL DATA:  Abdominal  pain for 4 days EXAM: ULTRASOUND ABDOMEN LIMITED RIGHT UPPER QUADRANT COMPARISON:  None. FINDINGS: Gallbladder: Cholelithiasis with 1 cm gallstone. No gallbladder wall thickening or edema. Murphy's sign is negative. Common bile duct: Diameter: 2 mm, normal Liver: Heterogeneous increased parenchymal echotexture with nodular contour suggesting hepatic cirrhosis. No focal lesions are identified. Portal vein is patent on color Doppler imaging with normal direction of blood flow towards the liver. Other: None. IMPRESSION: 1. Cholelithiasis without additional signs of acute cholecystitis. 2. Heterogeneous liver parenchymal echotexture with nodular contour suggesting a [place] cirrhosis. Electronically Signed   By: Lucienne Capers M.D.   On: 07/14/2021 19:23    Subjective: Patient was seen and examined today.  No new complaints.  Breathing seems to be at baseline.  On room air.  Discharge Exam: Vitals:   07/16/21 0747 07/16/21 1119  BP: 110/67 (!) 137/53  Pulse: 91 89  Resp: 17 18  Temp: 98.2 F (36.8 C) 97.7 F (36.5 C)  SpO2: 94% 95%   Vitals:   07/16/21 0103 07/16/21 0353 07/16/21 0747 07/16/21 1119  BP: 121/82 116/74 110/67 (!) 137/53  Pulse: (!) 102 94 91 89  Resp: 20 20 17 18   Temp: 98.3 F (36.8 C) 98.1 F (36.7 C) 98.2 F (36.8 C) 97.7 F (36.5 C)  TempSrc: Oral     SpO2:  96% 94% 95%  Weight:      Height:        General: Pt is alert, awake, not in acute distress Cardiovascular: RRR, S1/S2 +, no rubs, no gallops Respiratory: CTA bilaterally, no wheezing, no rhonchi Abdominal: Soft, NT, ND, bowel sounds + Extremities: no edema, no cyanosis, right AKA   The results of significant diagnostics from this hospitalization (including imaging, microbiology, ancillary and laboratory) are listed below for reference.    Microbiology: Recent Results (from the past 240 hour(s))  Resp Panel by RT-PCR (Flu A&B, Covid) Nasopharyngeal Swab     Status: None   Collection Time: 07/14/21   1:52 PM   Specimen: Nasopharyngeal Swab; Nasopharyngeal(NP) swabs in vial transport medium  Result Value Ref Range Status   SARS Coronavirus 2 by RT PCR NEGATIVE NEGATIVE Final    Comment: (NOTE) SARS-CoV-2 target nucleic acids are NOT DETECTED.  The SARS-CoV-2 RNA is generally detectable in upper respiratory specimens during the acute phase of infection. The lowest concentration of SARS-CoV-2 viral copies this assay can detect is 138 copies/mL. A negative result does not preclude SARS-Cov-2 infection and should not be used as the sole basis for treatment or other patient management decisions. A negative result may occur with  improper specimen collection/handling, submission of specimen other than nasopharyngeal swab, presence of viral mutation(s) within the areas targeted by this assay, and inadequate number of viral copies(<138 copies/mL). A negative result must be combined with clinical observations, patient history, and epidemiological information. The expected result is Negative.  Fact Sheet for Patients:  EntrepreneurPulse.com.au  Fact Sheet for Healthcare Providers:  IncredibleEmployment.be  This test is no t yet approved or cleared by the [place] FDA and  has been authorized for detection and/or diagnosis of  SARS-CoV-2 by FDA under an Emergency Use Authorization (EUA). This EUA will remain  in effect (meaning this test can be used) for the duration of the COVID-19 declaration under Section 564(b)(1) of the Act, 21 U.S.C.section 360bbb-3(b)(1), unless the authorization is terminated  or revoked sooner.       Influenza A by PCR NEGATIVE NEGATIVE Final   Influenza B by PCR NEGATIVE NEGATIVE Final    Comment: (NOTE) The Xpert Xpress SARS-CoV-2/FLU/RSV plus assay is intended as an aid in the diagnosis of influenza from Nasopharyngeal swab specimens and should not be used as a sole basis for treatment. Nasal washings and aspirates  are unacceptable for Xpert Xpress SARS-CoV-2/FLU/RSV testing.  Fact Sheet for Patients: EntrepreneurPulse.com.au  Fact Sheet for Healthcare Providers: IncredibleEmployment.be  This test is not yet approved or cleared by the [place] FDA and has been authorized for detection and/or diagnosis of SARS-CoV-2 by FDA under an Emergency Use Authorization (EUA). This EUA will remain in effect (meaning this test can be used) for the duration of the COVID-19 declaration under Section 564(b)(1) of the Act, 21 U.S.C. section 360bbb-3(b)(1), unless the authorization is terminated or revoked.  Performed at [facility], [address] [place], [place] [address]      Labs: BNP (last 3 results) Recent Labs    07/14/21 1903  BNP 76.1   Basic Metabolic Panel: Recent Labs  Lab 07/14/21 1352 07/14/21 1540 07/15/21 0656 07/16/21 0539  NA 136  --  137 135  K 3.3*  --  3.7 3.9  CL 101  --  105 106  CO2 22  --  24 24  GLUCOSE 89  --  97 169*  BUN 5*  --  <5* 8  CREATININE 0.56  --  <0.30* <0.30*  CALCIUM 8.7*  --  7.8* 9.0  MG  --  1.7 1.7  --   PHOS  --   --  2.3*  --    Liver Function Tests: Recent Labs  Lab 07/14/21 1903 07/15/21 0656 07/16/21 0539  AST 71* 57* 54*  ALT 44 37 38  ALKPHOS 86 75 83  BILITOT 0.9 1.5* 1.5*  PROT 7.4 6.3* 8.0  ALBUMIN 3.7 3.1* 3.6   Recent Labs  Lab 07/14/21 1903  LIPASE 26   No results for input(s): AMMONIA in the last 168 hours. CBC: Recent Labs  Lab 07/14/21 1352 07/15/21 0736 07/16/21 0539  WBC 5.5 3.5* 6.1  NEUTROABS  --  2.4 4.9  HGB 15.7* 13.6 15.2*  HCT 45.5 38.9 44.0  MCV 92.9 92.6 92.2  PLT 78* 46* 51*   Cardiac Enzymes: No results for input(s): CKTOTAL, CKMB, CKMBINDEX, TROPONINI in the last 168 hours. BNP: Invalid input(s): POCBNP CBG: No results for input(s): GLUCAP in the last 168 hours. D-Dimer No results for input(s): DDIMER in the last 72 hours. Hgb  A1c No results for input(s): HGBA1C in the last 72 hours. Lipid Profile No results for input(s): CHOL, HDL, LDLCALC, TRIG, CHOLHDL, LDLDIRECT in the last 72 hours. Thyroid function studies No results for input(s): TSH, T4TOTAL, T3FREE, THYROIDAB in the last 72 hours.  Invalid input(s): FREET3 Anemia work up No results for input(s): VITAMINB12, FOLATE, FERRITIN, TIBC, IRON, RETICCTPCT in the last 72 hours. Urinalysis    Component Value Date/Time   COLORURINE YELLOW (A) 08/25/2020 0912   APPEARANCEUR HAZY (A) 08/25/2020 0912   APPEARANCEUR Clear 05/13/2020 1547   LABSPEC 1.029 08/25/2020 0912   PHURINE 7.0 08/25/2020 0912   GLUCOSEU NEGATIVE 08/25/2020  [place] 08/25/2020 0912   BILIRUBINUR NEGATIVE 08/25/2020 0912   BILIRUBINUR Negative 05/13/2020 1547   KETONESUR NEGATIVE 08/25/2020 0912   PROTEINUR NEGATIVE 08/25/2020 0912   NITRITE NEGATIVE 08/25/2020 0912   LEUKOCYTESUR TRACE (A) 08/25/2020 0912   Sepsis Labs Invalid input(s): PROCALCITONIN,  WBC,  LACTICIDVEN Microbiology Recent Results (from the past 240 hour(s))  Resp Panel by RT-PCR (Flu A&B, Covid) Nasopharyngeal Swab     Status: None   Collection Time: 07/14/21  1:52 PM   Specimen: Nasopharyngeal Swab; Nasopharyngeal(NP) swabs in vial transport medium  Result Value Ref Range Status   SARS Coronavirus 2 by RT PCR NEGATIVE NEGATIVE Final    Comment: (NOTE) SARS-CoV-2 target nucleic acids are NOT DETECTED.  The SARS-CoV-2 RNA is generally detectable in upper respiratory specimens during the acute phase of infection. The lowest concentration of SARS-CoV-2 viral copies this assay can detect is 138 copies/mL. A negative result does not preclude SARS-Cov-2 infection and should not be used as the sole basis for treatment or other patient management decisions. A negative result may occur with  improper specimen collection/handling, submission of specimen other than nasopharyngeal swab, presence of viral  mutation(s) within the areas targeted by this assay, and inadequate number of viral copies(<138 copies/mL). A negative result must be combined with clinical observations, patient history, and epidemiological information. The expected result is Negative.  Fact Sheet for Patients:  EntrepreneurPulse.com.au  Fact Sheet for Healthcare Providers:  IncredibleEmployment.be  This test is no t yet approved or cleared by the [place] FDA and  has been authorized for detection and/or diagnosis of SARS-CoV-2 by FDA under an Emergency Use Authorization (EUA). This EUA will remain  in effect (meaning this test can be used) for the duration of the COVID-19 declaration under Section 564(b)(1) of the Act, 21 U.S.C.section 360bbb-3(b)(1), unless the authorization is terminated  or revoked sooner.       Influenza A by PCR NEGATIVE NEGATIVE Final   Influenza B by PCR NEGATIVE NEGATIVE Final    Comment: (NOTE) The Xpert Xpress SARS-CoV-2/FLU/RSV plus assay is intended as an aid in the diagnosis of influenza from Nasopharyngeal swab specimens and should not be used as a sole basis for treatment. Nasal washings and aspirates are unacceptable for Xpert Xpress SARS-CoV-2/FLU/RSV testing.  Fact Sheet for Patients: EntrepreneurPulse.com.au  Fact Sheet for Healthcare Providers: IncredibleEmployment.be  This test is not yet approved or cleared by the [place] FDA and has been authorized for detection and/or diagnosis of SARS-CoV-2 by FDA under an Emergency Use Authorization (EUA). This EUA will remain in effect (meaning this test can be used) for the duration of the COVID-19 declaration under Section 564(b)(1) of the Act, 21 U.S.C. section 360bbb-3(b)(1), unless the authorization is terminated or revoked.  Performed at [facility], [address] [place], [place] [address]     Time coordinating discharge:  Over 30 minutes  SIGNED:  Lorella Nimrod, MD  [facility] 07/16/2021, 3:41 PM  If 7PM-7AM, please contact night-coverage [email or web address]  This record has been created using Systems analyst. Errors have been sought and corrected,but may not always be located. Such creation errors do not reflect on the standard of care.

## 2021-07-17 ENCOUNTER — Telehealth: Payer: Self-pay | Admitting: *Deleted

## 2021-07-17 NOTE — Telephone Encounter (Signed)
Transition Care Management Follow-up Telephone Call Date of discharge and from where: 07/16/2021 [facility] How have you been since you were released from the hospital? "Fine" Any questions or concerns? No  Items Reviewed: Did the pt receive and understand the discharge instructions provided? Yes  Medications obtained and verified? Yes  Other? No  Any new allergies since your discharge? No  Dietary orders reviewed? No Do you have support at home? Yes    Functional Questionnaire: (I = Independent and D = Dependent) ADLs: I  Bathing/Dressing- I  Meal Prep- I  Eating- I  Maintaining continence- I  Transferring/Ambulation- I  Managing Meds- I  Follow up appointments reviewed:  PCP Hospital f/u appt confirmed? Yes  Scheduled to see Dr. Neomia Dear on 08/18/2021 @ 1400. [facility] f/u appt confirmed? Yes  Scheduled to see GI on 07/24/2021 @ 1330. Are transportation arrangements needed? No  If their condition worsens, is the pt aware to call PCP or go to the Emergency Dept.? Yes Was the patient provided with contact information for the PCP's office or ED? Yes Was to pt encouraged to call back with questions or concerns? Yes

## 2021-07-22 DIAGNOSIS — Z79899 Other long term (current) drug therapy: Secondary | ICD-10-CM | POA: Diagnosis not present

## 2021-07-24 ENCOUNTER — Other Ambulatory Visit: Payer: Self-pay

## 2021-07-24 ENCOUNTER — Encounter: Payer: Self-pay | Admitting: Gastroenterology

## 2021-07-24 ENCOUNTER — Ambulatory Visit (INDEPENDENT_AMBULATORY_CARE_PROVIDER_SITE_OTHER): Payer: Medicaid Other | Admitting: Gastroenterology

## 2021-07-24 VITALS — BP 154/90 | HR 105 | Temp 98.4°F | Ht 62.0 in | Wt 154.4 lb

## 2021-07-24 DIAGNOSIS — K219 Gastro-esophageal reflux disease without esophagitis: Secondary | ICD-10-CM

## 2021-07-24 DIAGNOSIS — I8510 Secondary esophageal varices without bleeding: Secondary | ICD-10-CM | POA: Diagnosis not present

## 2021-07-24 DIAGNOSIS — K7469 Other cirrhosis of liver: Secondary | ICD-10-CM

## 2021-07-24 DIAGNOSIS — K2210 Ulcer of esophagus without bleeding: Secondary | ICD-10-CM | POA: Diagnosis not present

## 2021-07-24 MED ORDER — OMEPRAZOLE 40 MG PO CPDR: 40.0000 mg | DELAYED_RELEASE_CAPSULE | Freq: Two times a day (BID) | ORAL | 3 refills | Status: AC
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
## 2021-07-25 DIAGNOSIS — Z79899 Other long term (current) drug therapy: Secondary | ICD-10-CM | POA: Diagnosis not present

## 2021-07-25 LAB — CBC
Hematocrit: 42.5 % (ref 34.0–46.6)
Hemoglobin: 15 g/dL (ref 11.1–15.9)
MCH: 32.5 pg (ref 26.6–33.0)
MCHC: 35.3 g/dL (ref 31.5–35.7)
MCV: 92 fL (ref 79–97)
Platelets: 71 10*3/uL — CL (ref 150–450)
RBC: 4.62 x10E6/uL (ref 3.77–5.28)
RDW: 13.6 % (ref 11.7–15.4)
WBC: 4.4 10*3/uL (ref 3.4–10.8)

## 2021-07-29 ENCOUNTER — Encounter: Payer: Self-pay | Admitting: Internal Medicine

## 2021-07-29 DIAGNOSIS — R52 Pain, unspecified: Secondary | ICD-10-CM

## 2021-07-29 NOTE — Telephone Encounter (Signed)
Referral has been made to Dr. Holley Raring.  Patient has appt tomorrow at 2:20pm for 40 mins.

## 2021-07-29 NOTE — Telephone Encounter (Signed)
Needs to be seen 40 mins hosp fu please  And refer her to pain mx requesitng dr Holley Raring  please thnx.

## 2021-07-29 NOTE — Telephone Encounter (Signed)
Thnx much

## 2021-07-30 ENCOUNTER — Encounter: Payer: Self-pay | Admitting: Gastroenterology

## 2021-07-30 ENCOUNTER — Other Ambulatory Visit: Payer: Self-pay

## 2021-07-30 ENCOUNTER — Encounter: Payer: Self-pay | Admitting: Internal Medicine

## 2021-07-30 ENCOUNTER — Ambulatory Visit (INDEPENDENT_AMBULATORY_CARE_PROVIDER_SITE_OTHER): Payer: Medicaid Other | Admitting: Internal Medicine

## 2021-07-30 VITALS — BP 124/84 | HR 76 | Temp 98.7°F | Ht 62.01 in | Wt 156.0 lb

## 2021-07-30 DIAGNOSIS — K921 Melena: Secondary | ICD-10-CM

## 2021-07-30 DIAGNOSIS — R296 Repeated falls: Secondary | ICD-10-CM

## 2021-07-30 DIAGNOSIS — S22089D Unspecified fracture of T11-T12 vertebra, subsequent encounter for fracture with routine healing: Secondary | ICD-10-CM

## 2021-07-30 DIAGNOSIS — D696 Thrombocytopenia, unspecified: Secondary | ICD-10-CM | POA: Diagnosis not present

## 2021-07-30 LAB — CBC WITH DIFFERENTIAL/PLATELET
Hematocrit: 41.5 % (ref 34.0–46.6)
Hemoglobin: 14.4 g/dL (ref 11.1–15.9)
Lymphocytes Absolute: 0.7 10*3/uL (ref 0.7–3.1)
Lymphs: 19 %
MCH: 32.1 pg (ref 26.6–33.0)
MCHC: 34.7 g/dL (ref 31.5–35.7)
MCV: 92 fL (ref 79–97)
MID (Absolute): 0.1 10*3/uL (ref 0.1–1.6)
MID: 4 %
Neutrophils Absolute: 3 10*3/uL (ref 1.4–7.0)
Neutrophils: 77 %
Platelets: 71 10*3/uL — CL (ref 150–450)
RBC: 4.49 x10E6/uL (ref 3.77–5.28)
RDW: 16.2 % — ABNORMAL HIGH (ref 11.7–15.4)
WBC: 3.8 10*3/uL (ref 3.4–10.8)

## 2021-07-30 NOTE — Progress Notes (Unsigned)
BP 124/84    Pulse 76    Temp 98.7 F (37.1 C) (Oral)    Ht 5' 2.01" (1.575 m)    Wt 156 lb (70.8 kg)    LMP 07/26/2021    SpO2 97%    BMI 28.53 kg/m    Subjective:    [name], female    DOB: [birth year], 46 y.o.   MRN: [id number]  Chief Complaint  Patient presents with   [facility] F/U   Blood In Stools    Last night.    referral for pain management   referral for Ortho    Needs referral for fractures in her back    Back Pain   Fatigue    HPI: Rhonda Martin is a 46 y.o. female  Has had bloody stools to have a 3rd endoscopy ? Sec to E varices and sh ehas had bloody stools to d/w GI for such. To see a new pain mx who is dealing with her pain. Has a contract with them is trying to get   Pt was discharged on 2/8 was 15.   Back Pain This is a recurrent (has had a problem with his back 4 rods and 8 screws in 2015 had to take these out.) problem.   Chief Complaint  Patient presents with   [facility] F/U   Blood In Stools    Last night.    referral for pain management   referral for Ortho    Needs referral for fractures in her back    Back Pain   Fatigue    Relevant past medical, surgical, family and social history reviewed and updated as indicated. Interim medical history since our last visit reviewed. Allergies and medications reviewed and updated.  Review of Systems  Musculoskeletal:  Positive for back pain.   Per HPI unless specifically indicated above     Objective:    BP 124/84    Pulse 76    Temp 98.7 F (37.1 C) (Oral)    Ht 5' 2.01" (1.575 m)    Wt 156 lb (70.8 kg)    LMP 07/26/2021    SpO2 97%    BMI 28.53 kg/m   Wt Readings from Last 3 Encounters:  08/05/21 145 lb (65.8 kg)  07/30/21 156 lb (70.8 kg)  07/24/21 154 lb 6 oz (70 kg)    Physical Exam Vitals and nursing note reviewed.  Constitutional:      General: She is not in acute distress.    Appearance: Normal appearance. She is not ill-appearing or diaphoretic.  Eyes:      Conjunctiva/sclera: Conjunctivae normal.  Cardiovascular:     Rate and Rhythm: Normal rate and regular rhythm.  Pulmonary:     Breath sounds: No rhonchi.  Abdominal:     General: Abdomen is flat. Bowel sounds are normal. There is no distension.     Palpations: Abdomen is soft. There is no mass.     Tenderness: There is no abdominal tenderness. There is no guarding.  Skin:    General: Skin is warm and dry.     Coloration: Skin is not jaundiced.     Findings: No erythema.  Neurological:     Mental Status: She is alert.     Cranial Nerves: No cranial nerve deficit.     Sensory: No sensory deficit.     Motor: No weakness.    Results for orders placed or performed in visit on 07/30/21  CBC with Differential/Platelet  Result Value Ref Range   WBC 3.8 3.4 - 10.8 x10E3/uL   RBC 4.46 3.77 - 5.28 x10E6/uL   Hemoglobin 14.3 11.1 - 15.9 g/dL   Hematocrit 41.4 34.0 - 46.6 %   MCV 93 79 - 97 fL   MCH 32.1 26.6 - 33.0 pg   MCHC 34.5 31.5 - 35.7 g/dL   RDW 13.9 11.7 - 15.4 %   Platelets 71 (LL) 150 - 450 x10E3/uL   Neutrophils 69 Not Estab. %   Lymphs 19 Not Estab. %   Monocytes 10 Not Estab. %   Eos 1 Not Estab. %   Basos 1 Not Estab. %   Neutrophils Absolute 2.6 1.4 - 7.0 x10E3/uL   Lymphocytes Absolute 0.7 0.7 - 3.1 x10E3/uL   Monocytes Absolute 0.4 0.1 - 0.9 x10E3/uL   EOS (ABSOLUTE) 0.0 0.0 - 0.4 x10E3/uL   Basophils Absolute 0.1 0.0 - 0.2 x10E3/uL   Immature Granulocytes 0 Not Estab. %   Immature Grans (Abs) 0.0 0.0 - 0.1 x10E3/uL   Hematology Comments: Note:   CBC With Differential/Platelet  Result Value Ref Range   WBC 3.8 3.4 - 10.8 x10E3/uL   RBC 4.49 3.77 - 5.28 x10E6/uL   Hemoglobin 14.4 11.1 - 15.9 g/dL   Hematocrit 41.5 34.0 - 46.6 %   MCV 92 79 - 97 fL   MCH 32.1 26.6 - 33.0 pg   MCHC 34.7 31.5 - 35.7 g/dL   RDW 16.2 (H) 11.7 - 15.4 %   Platelets 71 (LL) 150 - 450 x10E3/uL   Neutrophils 77 Not Estab. %   Lymphs 19 Not Estab. %   MID 4 Not Estab. %    Neutrophils Absolute 3.0 1.4 - 7.0 x10E3/uL   Lymphocytes Absolute 0.7 0.7 - 3.1 x10E3/uL   MID (Absolute) 0.1 0.1 - 1.6 X10E3/uL       No current facility-administered medications for this visit. No current outpatient medications on file.  Facility-Administered Medications Ordered in Other Visits:    0.9 %  sodium chloride infusion, , Intravenous, Continuous, V[place], Tally Due, MD   lactated ringers infusion, , Intravenous, Continuous, Rochel Brome, MD, Last Rate: 10 mL/hr at 08/05/21 0953, New Bag at 08/05/21 0953    Assessment & Plan:   BRBPR :  Stable will need to fu with GI for such   erosive esophagitis ? Causing GI bleeds.  Problem List Items Addressed This Visit       Musculoskeletal and Integument   Closed fracture of T11 vertebra with routine healing   Relevant Orders   Ambulatory referral to Orthopedics   DG Bone Density     Hematopoietic and Hemostatic   Thrombocytopenia ([place])   Relevant Orders   Ambulatory referral to Hematology / Oncology     Other   Bloody stools - Primary   Relevant Orders   CBC with Differential/Platelet (Completed)   CBC With Differential/Platelet (Completed)   Recurrent falls   Relevant Orders   DG Bone Density     Orders Placed This Encounter  Procedures   DG Bone Density   CBC with Differential/Platelet   CBC With Differential/Platelet   Ambulatory referral to Orthopedics   Ambulatory referral to Hematology / Oncology     No orders of the defined types were placed in this encounter.    Follow up plan: Return in about 2 months (around 09/27/2021).

## 2021-07-31 LAB — CBC WITH DIFFERENTIAL/PLATELET
Basophils Absolute: 0.1 10*3/uL (ref 0.0–0.2)
Basos: 1 %
EOS (ABSOLUTE): 0 10*3/uL (ref 0.0–0.4)
Eos: 1 %
Hematocrit: 41.4 % (ref 34.0–46.6)
Hemoglobin: 14.3 g/dL (ref 11.1–15.9)
Immature Grans (Abs): 0 10*3/uL (ref 0.0–0.1)
Immature Granulocytes: 0 %
Lymphocytes Absolute: 0.7 10*3/uL (ref 0.7–3.1)
Lymphs: 19 %
MCH: 32.1 pg (ref 26.6–33.0)
MCHC: 34.5 g/dL (ref 31.5–35.7)
MCV: 93 fL (ref 79–97)
Monocytes Absolute: 0.4 10*3/uL (ref 0.1–0.9)
Monocytes: 10 %
Neutrophils Absolute: 2.6 10*3/uL (ref 1.4–7.0)
Neutrophils: 69 %
Platelets: 71 10*3/uL — CL (ref 150–450)
RBC: 4.46 x10E6/uL (ref 3.77–5.28)
RDW: 13.9 % (ref 11.7–15.4)
WBC: 3.8 10*3/uL (ref 3.4–10.8)

## 2021-08-04 ENCOUNTER — Encounter: Payer: Self-pay | Admitting: Internal Medicine

## 2021-08-04 NOTE — Telephone Encounter (Signed)
Ok to refer for home health thnx

## 2021-08-05 ENCOUNTER — Encounter: Payer: Self-pay | Admitting: Gastroenterology

## 2021-08-05 ENCOUNTER — Ambulatory Visit
Admission: RE | Admit: 2021-08-05 | Discharge: 2021-08-05 | Disposition: A | Discharge status: Home / Self Care | Payer: Medicaid Other | Attending: Gastroenterology | Admitting: Gastroenterology

## 2021-08-05 ENCOUNTER — Other Ambulatory Visit: Payer: Self-pay

## 2021-08-05 ENCOUNTER — Encounter
Admission: RE | Disposition: A | Discharge status: Home / Self Care | Payer: Self-pay | Source: Home / Self Care | Attending: Gastroenterology

## 2021-08-05 ENCOUNTER — Ambulatory Visit: Payer: Medicaid Other | Admitting: Anesthesiology

## 2021-08-05 DIAGNOSIS — J449 Chronic obstructive pulmonary disease, unspecified: Secondary | ICD-10-CM | POA: Insufficient documentation

## 2021-08-05 DIAGNOSIS — K7460 Unspecified cirrhosis of liver: Secondary | ICD-10-CM

## 2021-08-05 DIAGNOSIS — F17200 Nicotine dependence, unspecified, uncomplicated: Secondary | ICD-10-CM | POA: Insufficient documentation

## 2021-08-05 DIAGNOSIS — I8500 Esophageal varices without bleeding: Secondary | ICD-10-CM | POA: Diagnosis not present

## 2021-08-05 DIAGNOSIS — K3189 Other diseases of stomach and duodenum: Secondary | ICD-10-CM | POA: Insufficient documentation

## 2021-08-05 DIAGNOSIS — I8510 Secondary esophageal varices without bleeding: Secondary | ICD-10-CM | POA: Diagnosis not present

## 2021-08-05 DIAGNOSIS — K766 Portal hypertension: Secondary | ICD-10-CM | POA: Insufficient documentation

## 2021-08-05 DIAGNOSIS — K209 Esophagitis, unspecified without bleeding: Secondary | ICD-10-CM | POA: Diagnosis not present

## 2021-08-05 DIAGNOSIS — Z09 Encounter for follow-up examination after completed treatment for conditions other than malignant neoplasm: Secondary | ICD-10-CM | POA: Insufficient documentation

## 2021-08-05 DIAGNOSIS — K219 Gastro-esophageal reflux disease without esophagitis: Secondary | ICD-10-CM | POA: Diagnosis not present

## 2021-08-05 DIAGNOSIS — I10 Essential (primary) hypertension: Secondary | ICD-10-CM | POA: Insufficient documentation

## 2021-08-05 HISTORY — DX: Unspecified cirrhosis of liver: K74.60

## 2021-08-05 HISTORY — PX: ESOPHAGOGASTRODUODENOSCOPY (EGD) WITH PROPOFOL: SHX5813

## 2021-08-05 HISTORY — DX: Presence of dental prosthetic device (complete) (partial): Z97.2

## 2021-08-05 HISTORY — DX: Unspecified osteoarthritis, unspecified site: M19.90

## 2021-08-05 HISTORY — DX: Complete traumatic amputation of unspecified lower leg, level unspecified, initial encounter: S88.919A

## 2021-08-05 LAB — POCT PREGNANCY, URINE: Preg Test, Ur: NEGATIVE

## 2021-08-05 SURGERY — ESOPHAGOGASTRODUODENOSCOPY (EGD) WITH PROPOFOL
Anesthesia: General | Site: Esophagus

## 2021-08-05 MED ORDER — LACTATED RINGERS IV SOLN: INTRAVENOUS | Status: DC

## 2021-08-05 MED ORDER — LIDOCAINE HCL (CARDIAC) PF 100 MG/5ML IV SOSY
PREFILLED_SYRINGE | INTRAVENOUS | Status: DC | PRN
  Administered 2021-08-05: 30 mg via INTRAVENOUS

## 2021-08-05 MED ORDER — SODIUM CHLORIDE 0.9 % IV SOLN: INTRAVENOUS | Status: DC

## 2021-08-05 MED ORDER — STERILE WATER FOR IRRIGATION IR SOLN
Status: DC | PRN
  Administered 2021-08-05: 50 mL

## 2021-08-05 MED ORDER — PROPOFOL 10 MG/ML IV BOLUS
INTRAVENOUS | Status: DC | PRN
  Administered 2021-08-05: 150 mg via INTRAVENOUS
  Administered 2021-08-05: 30 mg via INTRAVENOUS

## 2021-08-05 SURGICAL SUPPLY — 7 items
BLOCK BITE 60FR ADLT L/F GRN (MISCELLANEOUS) ×2 IMPLANT
GOWN CVR UNV OPN BCK APRN NK (MISCELLANEOUS) ×2 IMPLANT
GOWN ISOL THUMB LOOP REG UNIV (MISCELLANEOUS) ×4
KIT PRC NS LF DISP ENDO (KITS) ×1 IMPLANT
KIT PROCEDURE OLYMPUS (KITS) ×2
MANIFOLD NEPTUNE II (INSTRUMENTS) ×2 IMPLANT
WATER STERILE IRR 250ML POUR (IV SOLUTION) ×2 IMPLANT

## 2021-08-05 NOTE — Transfer of Care (Signed)
Immediate Anesthesia Transfer of Care Note  Patient: Rhonda Martin  Procedure(s) Performed: ESOPHAGOGASTRODUODENOSCOPY (EGD) WITH PROPOFOL (Esophagus)  Patient Location: PACU  Anesthesia Type: General  Level of Consciousness: awake, alert  and patient cooperative  Airway and Oxygen Therapy: Patient Spontanous Breathing and Patient connected to supplemental oxygen  Post-op Assessment: Post-op Vital signs reviewed, Patient's Cardiovascular Status Stable, Respiratory Function Stable, Patent Airway and No signs of Nausea or vomiting  Post-op Vital Signs: Reviewed and stable  Complications: No notable events documented.
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
## 2021-08-05 NOTE — Op Note (Signed)
[facility] Gastroenterology Patient Name: Rhonda Martin Procedure Date: 08/05/2021 10:32 AM MRN: [id number] Account #: [id number] Date of Birth: Sep 03, 1975 Admit Type: Outpatient Age: 46 Room: [facility] OR ROOM 01 Gender: Female Note Status: Finalized Instrument Name: [id number] Procedure:             Upper GI endoscopy Indications:           Follow-up of esophagitis Providers:             Lin Landsman MD, MD Referring MD:          Charlynne Cousins (Referring MD) Medicines:             General Anesthesia Complications:         No immediate complications. Estimated blood loss: None. Procedure:             Pre-Anesthesia Assessment:                        - Prior to the procedure, a History and Physical was                         performed, and patient medications and allergies were                         reviewed. The patient is competent. The risks and                         benefits of the procedure and the sedation options and                         risks were discussed with the patient. All questions                         were answered and informed consent was obtained.                         Patient identification and proposed procedure were                         verified by the physician, the nurse, the                         anesthesiologist, the anesthetist and the technician                         in the pre-procedure area in the procedure room in the                         endoscopy suite. Mental Status Examination: alert and                         oriented. Airway Examination: normal oropharyngeal                         airway and neck mobility. Respiratory Examination:                         clear to auscultation. CV Examination: normal.  Prophylactic Antibiotics: The patient does not require                         prophylactic antibiotics. Prior Anticoagulants: The                         patient has taken no previous  anticoagulant or                         antiplatelet agents. ASA Grade Assessment: III - A                         patient with severe systemic disease. After reviewing                         the risks and benefits, the patient was deemed in                         satisfactory condition to undergo the procedure. The                         anesthesia plan was to use general anesthesia.                         Immediately prior to administration of medications,                         the patient was re-assessed for adequacy to receive                         sedatives. The heart rate, respiratory rate, oxygen                         saturations, blood pressure, adequacy of pulmonary                         ventilation, and response to care were monitored                         throughout the procedure. The physical status of the                         patient was re-assessed after the procedure.                        After obtaining informed consent, the endoscope was                         passed under direct vision. Throughout the procedure,                         the patient's blood pressure, pulse, and oxygen                         saturations were monitored continuously. The was                         introduced through the mouth, and advanced to the  second part of duodenum. The upper GI endoscopy was                         accomplished without difficulty. The patient tolerated                         the procedure well. Findings:      The duodenal bulb and second portion of the duodenum were normal.      Mild portal hypertensive gastropathy was found in the gastric fundus and       in the gastric body.      The incisura and gastric antrum were normal.      The cardia and gastric fundus were normal on retroflexion.      Two columns of small (< 5 mm) varices with no bleeding and no stigmata       of recent bleeding were found in the lower third of  the esophagus, 38 cm       from the incisors. No red wale signs were present.      Esophagogastric landmarks were identified: the gastroesophageal junction       was found at 38 cm from the incisors. Impression:            - Normal duodenal bulb and second portion of the                         duodenum.                        - Portal hypertensive gastropathy.                        - Normal incisura and antrum.                        - Small (< 5 mm) esophageal varices with no bleeding                         and no stigmata of recent bleeding.                        - Esophagogastric landmarks identified.                        - No specimens collected. Recommendation:        - Discharge patient to home (with escort).                        - Low sodium diet.                        - Continue present medications.                        - Return to my office as previously scheduled. Procedure Code(s):     --- Professional ---                        [phone], Esophagogastroduodenoscopy, flexible,                         transoral; diagnostic, including collection of  specimen(s) by brushing or washing, when performed                         (separate procedure) Diagnosis Code(s):     --- Professional ---                        K76.6, Portal hypertension                        K31.89, Other diseases of stomach and duodenum                        I85.00, Esophageal varices without bleeding                        K20.90, Esophagitis, unspecified without bleeding CPT copyright 2019 American Medical Association. All rights reserved. The codes documented in this report are preliminary and upon coder review may  be revised to meet current compliance requirements. Dr. Ulyess Mort Lin Landsman MD, MD 08/05/2021 10:50:46 AM This report has been signed electronically. Number of Addenda: 0 Note Initiated On: 08/05/2021 10:32 AM Total Procedure Duration: 0 hours 2  minutes 47 seconds  Estimated Blood Loss:  Estimated blood loss: none.      [facility]

## 2021-08-05 NOTE — Anesthesia Procedure Notes (Signed)
Date/Time: 08/05/2021 10:42 AM Performed by: Cameron Ali, CRNA Pre-anesthesia Checklist: Patient identified, Emergency Drugs available, Suction available, Timeout performed and Patient being monitored Patient Re-evaluated:Patient Re-evaluated prior to induction Oxygen Delivery Method: Nasal cannula Placement Confirmation: positive ETCO2

## 2021-08-05 NOTE — Anesthesia Postprocedure Evaluation (Signed)
Anesthesia Post Note  Patient: Rhonda Martin  Procedure(s) Performed: ESOPHAGOGASTRODUODENOSCOPY (EGD) WITH PROPOFOL (Esophagus)     Patient location during evaluation: PACU Anesthesia Type: General Level of consciousness: awake and alert Pain management: pain level controlled Vital Signs Assessment: post-procedure vital signs reviewed and stable Respiratory status: spontaneous breathing, nonlabored ventilation, respiratory function stable and patient connected to nasal cannula oxygen Cardiovascular status: blood pressure returned to baseline and stable Postop Assessment: no apparent nausea or vomiting Anesthetic complications: no   No notable events documented.  Trecia Rogers

## 2021-08-05 NOTE — Anesthesia Preprocedure Evaluation (Signed)
Anesthesia Evaluation  Patient identified by MRN, date of birth, ID band Patient awake    Reviewed: Allergy & Precautions, H&P , NPO status , Patient's Chart, lab work & pertinent test results, reviewed documented beta blocker date and time   Airway Mallampati: I  TM Distance: >3 FB Neck ROM: full    Dental no notable dental hx.    Pulmonary COPD, Current Smoker,    Pulmonary exam normal breath sounds clear to auscultation       Cardiovascular Exercise Tolerance: Good hypertension, negative cardio ROS Normal cardiovascular exam Rhythm:regular Rate:Normal     Neuro/Psych negative neurological ROS  negative psych ROS   GI/Hepatic GERD  ,(+) Cirrhosis     substance abuse  alcohol use, Hepatitis -, C  Endo/Other  negative endocrine ROS  Renal/GU negative Renal ROS  negative genitourinary   Musculoskeletal   Abdominal   Peds  Hematology negative hematology ROS (+)   Anesthesia Other Findings   Reproductive/Obstetrics negative OB ROS                             Anesthesia Physical Anesthesia Plan  ASA: 3  Anesthesia Plan: General   Post-op Pain Management:    Induction:   PONV Risk Score and Plan:   Airway Management Planned:   Additional Equipment:   Intra-op Plan:   Post-operative Plan:   Informed Consent: I have reviewed the patients History and Physical, chart, labs and discussed the procedure including the risks, benefits and alternatives for the proposed anesthesia with the patient or authorized representative who has indicated his/her understanding and acceptance.     Dental Advisory Given  Plan Discussed with: CRNA and Anesthesiologist  Anesthesia Plan Comments:         Anesthesia Quick Evaluation

## 2021-08-14 ENCOUNTER — Telehealth: Payer: Self-pay

## 2021-08-14 NOTE — Telephone Encounter (Signed)
I called and spoke with patient in regards to their new patient appointment. Patient states she knows where we are located and doesn't have questions or concerns at the moment.  ?

## 2021-08-15 ENCOUNTER — Encounter: Payer: Self-pay | Admitting: Gastroenterology

## 2021-08-15 DIAGNOSIS — K625 Hemorrhage of anus and rectum: Secondary | ICD-10-CM

## 2021-08-18 ENCOUNTER — Ambulatory Visit: Payer: Medicaid Other | Admitting: Internal Medicine

## 2021-08-19 ENCOUNTER — Encounter: Payer: Medicaid Other | Admitting: Oncology

## 2021-08-19 ENCOUNTER — Other Ambulatory Visit: Payer: Medicaid Other

## 2021-08-19 ENCOUNTER — Other Ambulatory Visit: Payer: Self-pay

## 2021-08-19 DIAGNOSIS — S78119A Complete traumatic amputation at level between unspecified hip and knee, initial encounter: Secondary | ICD-10-CM

## 2021-08-19 DIAGNOSIS — J449 Chronic obstructive pulmonary disease, unspecified: Secondary | ICD-10-CM

## 2021-08-19 MED ORDER — HYDROCORTISONE (PERIANAL) 2.5 % EX CREA: 1.0000 "application " | TOPICAL_CREAM | Freq: Two times a day (BID) | CUTANEOUS | 0 refills | Status: AC

## 2021-08-19 MED ORDER — SUCRALFATE 1 GM/10ML PO SUSP: 1.0000 g | Freq: Four times a day (QID) | ORAL | 0 refills | Status: DC

## 2021-08-27 DIAGNOSIS — Z79899 Other long term (current) drug therapy: Secondary | ICD-10-CM | POA: Diagnosis not present

## 2021-08-28 NOTE — Telephone Encounter (Signed)
Ms Rhonda Martin, this pt needs home health for ADL's cannot be outpatient. Please see if this can be provided / we need to sign anything from our end for her thnx.

## 2021-08-29 DIAGNOSIS — K625 Hemorrhage of anus and rectum: Secondary | ICD-10-CM | POA: Diagnosis not present

## 2021-08-30 LAB — CBC
Hematocrit: 40.5 % (ref 34.0–46.6)
Hemoglobin: 14.4 g/dL (ref 11.1–15.9)
MCH: 33.4 pg — ABNORMAL HIGH (ref 26.6–33.0)
MCHC: 35.6 g/dL (ref 31.5–35.7)
MCV: 94 fL (ref 79–97)
Platelets: 61 10*3/uL — CL (ref 150–450)
RBC: 4.31 x10E6/uL (ref 3.77–5.28)
RDW: 14 % (ref 11.7–15.4)
WBC: 4.6 10*3/uL (ref 3.4–10.8)

## 2021-09-01 ENCOUNTER — Telehealth: Payer: Self-pay

## 2021-09-01 DIAGNOSIS — Z79899 Other long term (current) drug therapy: Secondary | ICD-10-CM | POA: Diagnosis not present

## 2021-09-01 NOTE — Telephone Encounter (Signed)
-----   Message from Lin Landsman, MD sent at 09/01/2021 11:01 AM EDT ----- ?Her hemoglobin is normal.  She can stop Carafate.  Please check with her if she is still taking omeprazole 40 mg twice daily, if not she can decrease to once a day ? ?RV ?

## 2021-09-01 NOTE — Telephone Encounter (Signed)
Patient verbalized understanding of results and she will decrease the omeprazole to once a day  ?

## 2021-09-02 ENCOUNTER — Ambulatory Visit (INDEPENDENT_AMBULATORY_CARE_PROVIDER_SITE_OTHER): Payer: Medicaid Other | Admitting: Orthopaedic Surgery

## 2021-09-02 ENCOUNTER — Encounter: Payer: Self-pay | Admitting: Orthopaedic Surgery

## 2021-09-02 ENCOUNTER — Other Ambulatory Visit: Payer: Self-pay

## 2021-09-02 ENCOUNTER — Ambulatory Visit: Payer: Self-pay

## 2021-09-02 VITALS — BP 114/69 | HR 65 | Ht 62.0 in | Wt 146.0 lb

## 2021-09-02 DIAGNOSIS — M545 Low back pain, unspecified: Secondary | ICD-10-CM

## 2021-09-02 DIAGNOSIS — G8929 Other chronic pain: Secondary | ICD-10-CM

## 2021-09-03 ENCOUNTER — Encounter: Payer: Self-pay | Admitting: Oncology

## 2021-09-03 ENCOUNTER — Inpatient Hospital Stay: Payer: Medicaid Other

## 2021-09-03 ENCOUNTER — Inpatient Hospital Stay: Payer: Medicaid Other | Attending: Oncology | Admitting: Oncology

## 2021-09-03 VITALS — BP 135/85 | HR 89 | Temp 98.6°F | Resp 18 | Wt 153.8 lb

## 2021-09-03 DIAGNOSIS — G600 Hereditary motor and sensory neuropathy: Secondary | ICD-10-CM | POA: Diagnosis not present

## 2021-09-03 DIAGNOSIS — D696 Thrombocytopenia, unspecified: Secondary | ICD-10-CM | POA: Insufficient documentation

## 2021-09-03 DIAGNOSIS — Z79899 Other long term (current) drug therapy: Secondary | ICD-10-CM | POA: Insufficient documentation

## 2021-09-03 DIAGNOSIS — M6281 Muscle weakness (generalized): Secondary | ICD-10-CM | POA: Diagnosis not present

## 2021-09-03 DIAGNOSIS — K766 Portal hypertension: Secondary | ICD-10-CM | POA: Diagnosis not present

## 2021-09-03 DIAGNOSIS — G8929 Other chronic pain: Secondary | ICD-10-CM | POA: Diagnosis not present

## 2021-09-03 DIAGNOSIS — F17210 Nicotine dependence, cigarettes, uncomplicated: Secondary | ICD-10-CM | POA: Diagnosis not present

## 2021-09-03 DIAGNOSIS — K7030 Alcoholic cirrhosis of liver without ascites: Secondary | ICD-10-CM | POA: Insufficient documentation

## 2021-09-03 LAB — CBC WITH DIFFERENTIAL/PLATELET
Abs Immature Granulocytes: 0 10*3/uL (ref 0.00–0.07)
Basophils Absolute: 0 10*3/uL (ref 0.0–0.1)
Basophils Relative: 2 %
Eosinophils Absolute: 0 10*3/uL (ref 0.0–0.5)
Eosinophils Relative: 2 %
HCT: 39.3 % (ref 36.0–46.0)
Hemoglobin: 13.4 g/dL (ref 12.0–15.0)
Immature Granulocytes: 0 %
Lymphocytes Relative: 27 %
Lymphs Abs: 0.6 10*3/uL — ABNORMAL LOW (ref 0.7–4.0)
MCH: 33.2 pg (ref 26.0–34.0)
MCHC: 34.1 g/dL (ref 30.0–36.0)
MCV: 97.3 fL (ref 80.0–100.0)
Monocytes Absolute: 0.3 10*3/uL (ref 0.1–1.0)
Monocytes Relative: 15 %
Neutro Abs: 1.3 10*3/uL — ABNORMAL LOW (ref 1.7–7.7)
Neutrophils Relative %: 54 %
Platelets: 73 10*3/uL — ABNORMAL LOW (ref 150–400)
RBC: 4.04 MIL/uL (ref 3.87–5.11)
RDW: 15.8 % — ABNORMAL HIGH (ref 11.5–15.5)
WBC: 2.3 10*3/uL — ABNORMAL LOW (ref 4.0–10.5)
nRBC: 0 % (ref 0.0–0.2)

## 2021-09-03 LAB — VITAMIN B12: Vitamin B-12: 1220 pg/mL — ABNORMAL HIGH (ref 180–914)

## 2021-09-03 LAB — TECHNOLOGIST SMEAR REVIEW: Plt Morphology: DECREASED

## 2021-09-03 LAB — IMMATURE PLATELET FRACTION: Immature Platelet Fraction: 11.6 % — ABNORMAL HIGH (ref 1.2–8.6)

## 2021-09-03 LAB — FOLATE: Folate: 20.9 ng/mL (ref >5.9)
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
## 2021-09-03 NOTE — Progress Notes (Signed)
Patient here for follow up. Pt reports that she has been having abdominal pain.  ?

## 2021-09-05 LAB — COMP PANEL: LEUKEMIA/LYMPHOMA

## 2021-09-09 ENCOUNTER — Encounter: Payer: Self-pay | Admitting: Oncology

## 2021-09-10 ENCOUNTER — Telehealth: Payer: Self-pay | Admitting: *Deleted

## 2021-09-10 ENCOUNTER — Ambulatory Visit: Payer: Medicaid Other

## 2021-09-10 NOTE — Telephone Encounter (Signed)
[place] called to report that patient did not show up for her appointment this morning ?

## 2021-09-12 ENCOUNTER — Other Ambulatory Visit: Payer: Self-pay | Admitting: Gastroenterology

## 2021-09-15 ENCOUNTER — Encounter: Payer: Self-pay | Admitting: Internal Medicine

## 2021-09-15 IMAGING — DX DG CHEST 1V PORT
1 series · 1 of 1 positions shown · non-contrast
Comparison: September 03, 2019

CLINICAL DATA: Bloody emesis

EXAM:
PORTABLE CHEST 1 VIEW

[chest ap]
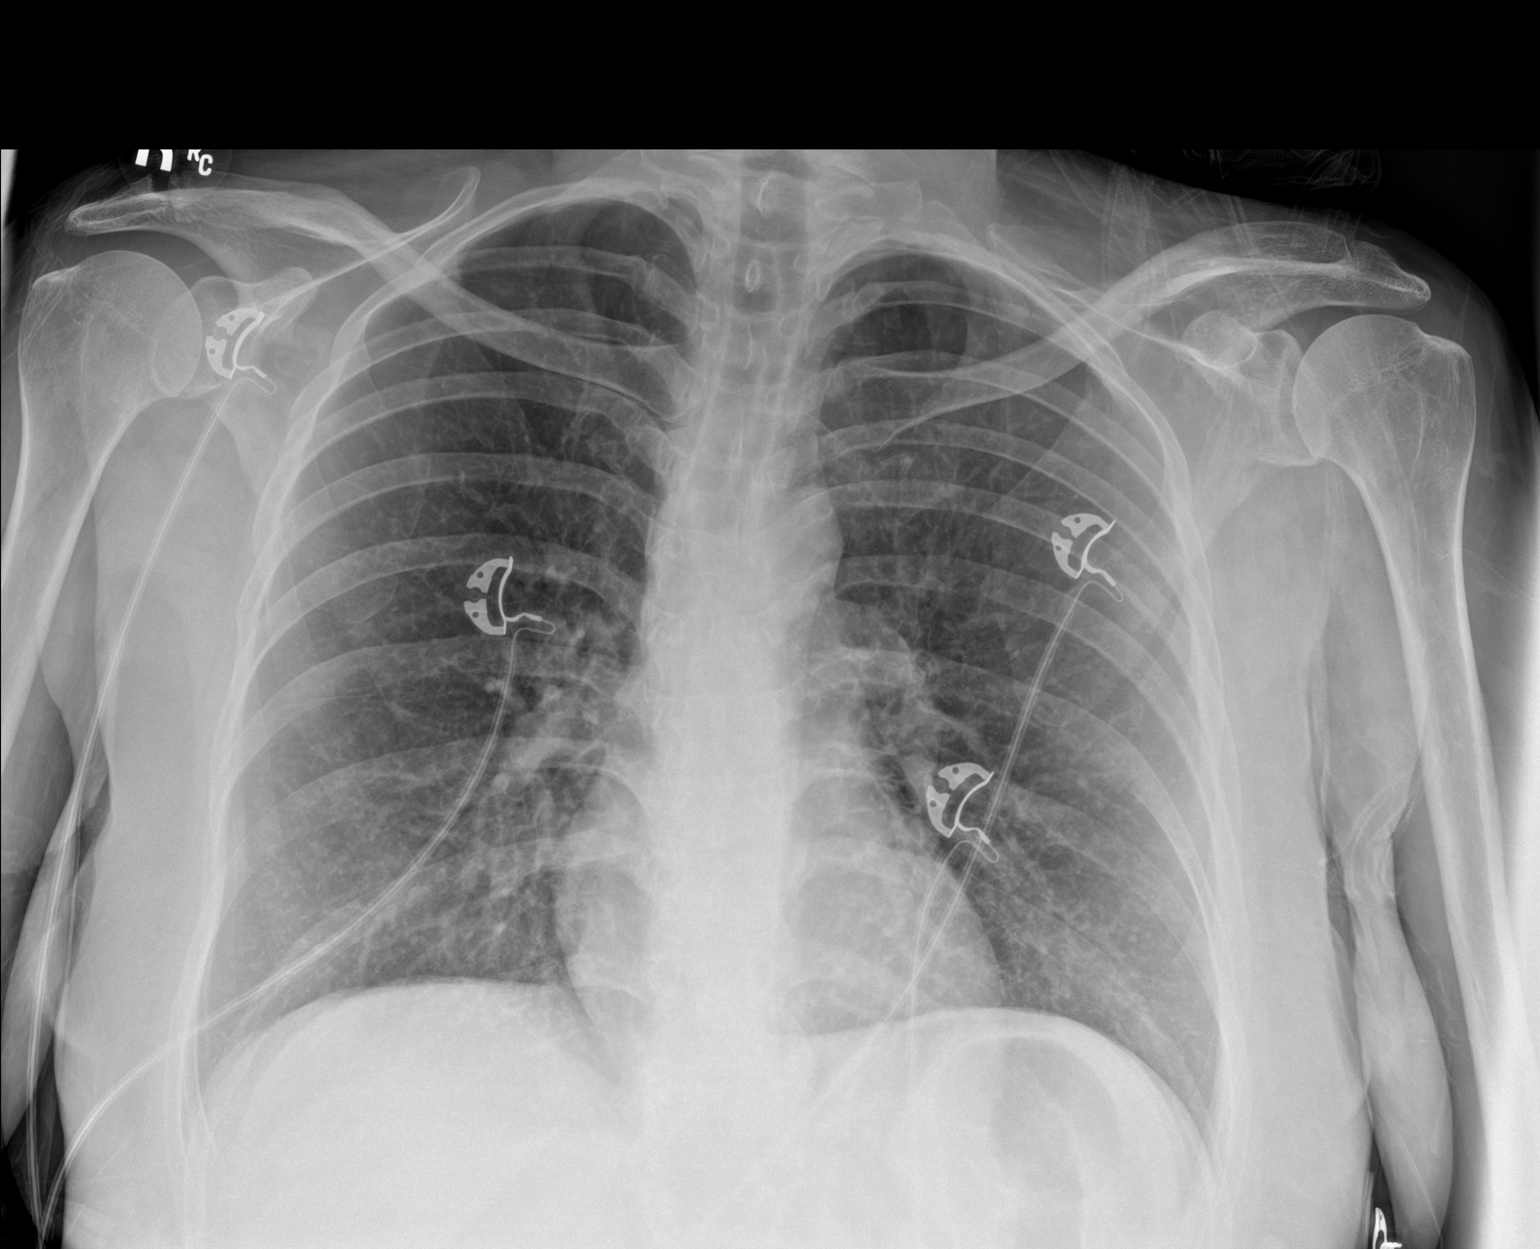

[1 of 1 positions shown; findings below may reference images not displayed]

FINDINGS: The heart size and mediastinal contours are within normal limits.
Both lungs are clear. The visualized skeletal structures are
unremarkable.
IMPRESSION: No active disease.

## 2021-09-24 DIAGNOSIS — Z79899 Other long term (current) drug therapy: Secondary | ICD-10-CM | POA: Diagnosis not present

## 2021-09-29 ENCOUNTER — Ambulatory Visit: Payer: Medicaid Other | Admitting: Internal Medicine

## 2021-09-29 ENCOUNTER — Telehealth: Payer: Self-pay | Admitting: Internal Medicine

## 2021-09-29 NOTE — Telephone Encounter (Signed)
.. ?  Medicaid Managed Care  ? ?Unsuccessful Outreach Note ? ?09/29/2021 ?Name: Rhonda Martin MRN: [id number] DOB: July 30, 1975 ? ?Referred by: Charlynne Cousins, MD ?Reason for referral : High Risk Managed Medicaid (I called the patient today to get her scheduled with the MM CSW. Patient stated she was very sick and asked that I call her back tomorrow. ) ? ? ?An unsuccessful telephone outreach was attempted today. The patient was referred to the case management team for assistance with care management and care coordination.  ? ?Follow Up Plan: The care management team will reach out to the patient again over the next 2 days.  ? ? ?Reita Chard ?Care Guide, High Risk Medicaid Managed Care ?Embedded Care Coordination ?[place]  ? ? ?SIGNATURE  ?

## 2021-09-30 ENCOUNTER — Ambulatory Visit: Payer: Medicaid Other | Admitting: Internal Medicine

## 2021-10-01 ENCOUNTER — Encounter: Payer: Self-pay | Admitting: Emergency Medicine

## 2021-10-01 ENCOUNTER — Emergency Department: Payer: Medicaid Other

## 2021-10-01 ENCOUNTER — Other Ambulatory Visit: Payer: Self-pay

## 2021-10-01 ENCOUNTER — Other Ambulatory Visit: Payer: Self-pay | Admitting: Gastroenterology

## 2021-10-01 ENCOUNTER — Ambulatory Visit: Payer: Self-pay | Admitting: *Deleted

## 2021-10-01 ENCOUNTER — Observation Stay
Admission: EM | Admit: 2021-10-01 | Discharge: 2021-10-02 | Disposition: A | Discharge status: Home / Self Care | Payer: Medicaid Other | Attending: Obstetrics and Gynecology | Admitting: Obstetrics and Gynecology

## 2021-10-01 DIAGNOSIS — E46 Unspecified protein-calorie malnutrition: Secondary | ICD-10-CM

## 2021-10-01 DIAGNOSIS — F411 Generalized anxiety disorder: Secondary | ICD-10-CM | POA: Diagnosis present

## 2021-10-01 DIAGNOSIS — K760 Fatty (change of) liver, not elsewhere classified: Secondary | ICD-10-CM | POA: Diagnosis not present

## 2021-10-01 DIAGNOSIS — Z96651 Presence of right artificial knee joint: Secondary | ICD-10-CM | POA: Diagnosis present

## 2021-10-01 DIAGNOSIS — R411 Anterograde amnesia: Secondary | ICD-10-CM | POA: Diagnosis not present

## 2021-10-01 DIAGNOSIS — F32A Depression, unspecified: Secondary | ICD-10-CM | POA: Diagnosis present

## 2021-10-01 DIAGNOSIS — I472 Ventricular tachycardia, unspecified: Secondary | ICD-10-CM

## 2021-10-01 DIAGNOSIS — R7401 Elevation of levels of liver transaminase levels: Secondary | ICD-10-CM | POA: Insufficient documentation

## 2021-10-01 DIAGNOSIS — I8500 Esophageal varices without bleeding: Secondary | ICD-10-CM | POA: Insufficient documentation

## 2021-10-01 DIAGNOSIS — K219 Gastro-esophageal reflux disease without esophagitis: Secondary | ICD-10-CM | POA: Diagnosis present

## 2021-10-01 DIAGNOSIS — J449 Chronic obstructive pulmonary disease, unspecified: Secondary | ICD-10-CM | POA: Insufficient documentation

## 2021-10-01 DIAGNOSIS — Z8249 Family history of ischemic heart disease and other diseases of the circulatory system: Secondary | ICD-10-CM | POA: Diagnosis not present

## 2021-10-01 DIAGNOSIS — R109 Unspecified abdominal pain: Secondary | ICD-10-CM | POA: Diagnosis not present

## 2021-10-01 DIAGNOSIS — D696 Thrombocytopenia, unspecified: Secondary | ICD-10-CM | POA: Diagnosis not present

## 2021-10-01 DIAGNOSIS — F329 Major depressive disorder, single episode, unspecified: Secondary | ICD-10-CM | POA: Insufficient documentation

## 2021-10-01 DIAGNOSIS — F1010 Alcohol abuse, uncomplicated: Secondary | ICD-10-CM | POA: Insufficient documentation

## 2021-10-01 DIAGNOSIS — I8510 Secondary esophageal varices without bleeding: Secondary | ICD-10-CM | POA: Diagnosis present

## 2021-10-01 DIAGNOSIS — F17210 Nicotine dependence, cigarettes, uncomplicated: Secondary | ICD-10-CM | POA: Diagnosis not present

## 2021-10-01 DIAGNOSIS — K920 Hematemesis: Principal | ICD-10-CM

## 2021-10-01 DIAGNOSIS — G8929 Other chronic pain: Secondary | ICD-10-CM | POA: Diagnosis present

## 2021-10-01 DIAGNOSIS — K7030 Alcoholic cirrhosis of liver without ascites: Secondary | ICD-10-CM | POA: Diagnosis present

## 2021-10-01 DIAGNOSIS — R531 Weakness: Secondary | ICD-10-CM | POA: Insufficient documentation

## 2021-10-01 DIAGNOSIS — R112 Nausea with vomiting, unspecified: Secondary | ICD-10-CM | POA: Diagnosis not present

## 2021-10-01 DIAGNOSIS — D61818 Other pancytopenia: Secondary | ICD-10-CM

## 2021-10-01 DIAGNOSIS — R1084 Generalized abdominal pain: Principal | ICD-10-CM | POA: Diagnosis present

## 2021-10-01 DIAGNOSIS — Z833 Family history of diabetes mellitus: Secondary | ICD-10-CM | POA: Diagnosis not present

## 2021-10-01 DIAGNOSIS — G621 Alcoholic polyneuropathy: Secondary | ICD-10-CM

## 2021-10-01 DIAGNOSIS — Z8541 Personal history of malignant neoplasm of cervix uteri: Secondary | ICD-10-CM | POA: Diagnosis not present

## 2021-10-01 DIAGNOSIS — F17200 Nicotine dependence, unspecified, uncomplicated: Secondary | ICD-10-CM | POA: Diagnosis present

## 2021-10-01 DIAGNOSIS — I209 Angina pectoris, unspecified: Secondary | ICD-10-CM

## 2021-10-01 DIAGNOSIS — Z89611 Acquired absence of right leg above knee: Secondary | ICD-10-CM

## 2021-10-01 DIAGNOSIS — N179 Acute kidney failure, unspecified: Secondary | ICD-10-CM | POA: Diagnosis not present

## 2021-10-01 DIAGNOSIS — R0602 Shortness of breath: Secondary | ICD-10-CM | POA: Diagnosis not present

## 2021-10-01 DIAGNOSIS — J441 Chronic obstructive pulmonary disease with (acute) exacerbation: Secondary | ICD-10-CM | POA: Diagnosis present

## 2021-10-01 DIAGNOSIS — F418 Other specified anxiety disorders: Secondary | ICD-10-CM | POA: Diagnosis present

## 2021-10-01 DIAGNOSIS — Z720 Tobacco use: Secondary | ICD-10-CM | POA: Diagnosis present

## 2021-10-01 LAB — CBC
HCT: 44.2 % (ref 36.0–46.0)
Hemoglobin: 14.9 g/dL (ref 12.0–15.0)
MCH: 31.6 pg (ref 26.0–34.0)
MCHC: 33.7 g/dL (ref 30.0–36.0)
MCV: 93.8 fL (ref 80.0–100.0)
Platelets: 87 10*3/uL — ABNORMAL LOW (ref 150–400)
RBC: 4.71 MIL/uL (ref 3.87–5.11)
RDW: 14.7 % (ref 11.5–15.5)
WBC: 7.3 10*3/uL (ref 4.0–10.5)
nRBC: 0 % (ref 0.0–0.2)

## 2021-10-01 LAB — URINALYSIS, ROUTINE W REFLEX MICROSCOPIC
Bilirubin Urine: NEGATIVE
Glucose, UA: NEGATIVE mg/dL
Hgb urine dipstick: NEGATIVE
Ketones, ur: NEGATIVE mg/dL
Leukocytes,Ua: NEGATIVE
Nitrite: NEGATIVE
Protein, ur: NEGATIVE mg/dL
Specific Gravity, Urine: 1.003 — ABNORMAL LOW (ref 1.005–1.030)
pH: 6 (ref 5.0–8.0)

## 2021-10-01 LAB — COMPREHENSIVE METABOLIC PANEL
ALT: 70 U/L — ABNORMAL HIGH (ref 0–44)
AST: 125 U/L — ABNORMAL HIGH (ref 15–41)
Albumin: 4.2 g/dL (ref 3.5–5.0)
Alkaline Phosphatase: 86 U/L (ref 38–126)
Anion gap: 13 (ref 5–15)
BUN: 7 mg/dL (ref 6–20)
CO2: 21 mmol/L — ABNORMAL LOW (ref 22–32)
Calcium: 8.9 mg/dL (ref 8.9–10.3)
Chloride: 102 mmol/L (ref 98–111)
Creatinine, Ser: 0.37 mg/dL — ABNORMAL LOW (ref 0.44–1.00)
GFR, Estimated: >60 mL/min (ref >60)
Glucose, Bld: 124 mg/dL — ABNORMAL HIGH (ref 70–99)
Potassium: 3.9 mmol/L (ref 3.5–5.1)
Sodium: 136 mmol/L (ref 135–145)
Total Bilirubin: 1.3 mg/dL — ABNORMAL HIGH (ref 0.3–1.2)
Total Protein: 8.4 g/dL — ABNORMAL HIGH (ref 6.5–8.1)

## 2021-10-01 LAB — POC URINE PREG, ED: Preg Test, Ur: NEGATIVE

## 2021-10-01 LAB — PROTIME-INR
INR: 1.2 (ref 0.8–1.2)
Prothrombin Time: 14.8 seconds (ref 11.4–15.2)

## 2021-10-01 LAB — LIPASE, BLOOD: Lipase: 37 U/L (ref 11–51)

## 2021-10-01 MED ORDER — HYDRALAZINE HCL 20 MG/ML IJ SOLN
5.0000 mg | INTRAMUSCULAR | Status: DC | PRN
Start: 2021-10-01 — End: 2021-10-02

## 2021-10-01 MED ORDER — PANTOPRAZOLE 80MG IVPB - SIMPLE MED
80.0000 mg | Freq: Once | INTRAVENOUS | Status: DC
  Filled 2021-10-01: qty 100

## 2021-10-01 MED ORDER — MORPHINE SULFATE (PF) 2 MG/ML IV SOLN
2.0000 mg | INTRAVENOUS | Status: DC | PRN
  Administered 2021-10-02: 2 mg via INTRAVENOUS
  Filled 2021-10-01: qty 1

## 2021-10-01 MED ORDER — SODIUM CHLORIDE 0.9 % IV SOLN: 1.0000 g | INTRAVENOUS | Status: DC

## 2021-10-01 MED ORDER — OXYCODONE HCL 5 MG PO TABS
5.0000 mg | ORAL_TABLET | ORAL | Status: DC | PRN
  Administered 2021-10-02 (×2): 5 mg via ORAL
  Filled 2021-10-01 (×2): qty 1

## 2021-10-01 MED ORDER — OCTREOTIDE LOAD VIA INFUSION
50.0000 ug | Freq: Once | INTRAVENOUS | Status: AC
  Administered 2021-10-01: 50 ug via INTRAVENOUS
  Filled 2021-10-01: qty 25

## 2021-10-01 MED ORDER — PANTOPRAZOLE SODIUM 40 MG IV SOLR: 40.0000 mg | Freq: Two times a day (BID) | INTRAVENOUS | Status: DC

## 2021-10-01 MED ORDER — NICOTINE 21 MG/24HR TD PT24
21.0000 mg | MEDICATED_PATCH | Freq: Every day | TRANSDERMAL | Status: DC
  Administered 2021-10-01: 21 mg via TRANSDERMAL
  Filled 2021-10-01 (×2): qty 1

## 2021-10-01 MED ORDER — SODIUM CHLORIDE 0.9 % IV SOLN
50.0000 ug/h | INTRAVENOUS | Status: DC
  Administered 2021-10-01: 50 ug/h via INTRAVENOUS
  Filled 2021-10-01 (×3): qty 1

## 2021-10-01 MED ORDER — BUPRENORPHINE 5 MCG/HR TD PTWK
1.0000 | MEDICATED_PATCH | TRANSDERMAL | Status: DC
  Administered 2021-10-02: 1 via TRANSDERMAL
  Filled 2021-10-01: qty 1

## 2021-10-01 MED ORDER — IOHEXOL 300 MG/ML  SOLN: 75.0000 mL | Freq: Once | INTRAMUSCULAR | Status: DC | PRN

## 2021-10-01 MED ORDER — PANTOPRAZOLE 80MG IVPB - SIMPLE MED
80.0000 mg | Freq: Once | INTRAVENOUS | Status: AC
  Administered 2021-10-01: 80 mg via INTRAVENOUS
  Filled 2021-10-01: qty 100

## 2021-10-01 MED ORDER — HYDROMORPHONE HCL 1 MG/ML IJ SOLN
1.0000 mg | Freq: Once | INTRAMUSCULAR | Status: AC
  Administered 2021-10-01: 1 mg via INTRAVENOUS
  Filled 2021-10-01: qty 1

## 2021-10-01 MED ORDER — DIAZEPAM 5 MG PO TABS: 5.0000 mg | ORAL_TABLET | Freq: Three times a day (TID) | ORAL | Status: DC | PRN

## 2021-10-01 MED ORDER — IOHEXOL 350 MG/ML SOLN
75.0000 mL | Freq: Once | INTRAVENOUS | Status: AC | PRN
  Administered 2021-10-01: 75 mL via INTRAVENOUS

## 2021-10-01 MED ORDER — SODIUM CHLORIDE 0.9 % IV SOLN
1.0000 g | Freq: Once | INTRAVENOUS | Status: AC
  Administered 2021-10-01: 1 g via INTRAVENOUS
  Filled 2021-10-01: qty 10

## 2021-10-01 NOTE — ED Notes (Signed)
Hospitalist at bedside 

## 2021-10-01 NOTE — ED Triage Notes (Signed)
First Nurse Note:  C/O generalized abdominal pain, nausea, vomiting, diarrhea x 4 days. ?

## 2021-10-01 NOTE — Telephone Encounter (Signed)
Reason for Disposition ? [1] SEVERE pain (e.g., excruciating) AND [2] present > 1 hour ? ?Answer Assessment - Initial Assessment Questions ?1. LOCATION: "Where does it hurt?"  ?    Different areas of abdomen ?2. RADIATION: "Does the pain shoot anywhere else?" (e.g., chest, back) ?    All throughout  abdomen ?3. ONSET: "When did the pain begin?" (e.g., minutes, hours or days ago)  ?    Hx abdominal pain- feels stomach is swelling- worse  ?4. SUDDEN: "Gradual or sudden onset?" ?    sudden ?5. PATTERN "Does the pain come and go, or is it constant?" ?   - If constant: "Is it getting better, staying the same, or worsening?"  ?    (Note: Constant means the pain never goes away completely; most serious pain is constant and it progresses)  ?   - If intermittent: "How long does it last?" "Do you have pain now?" ?    (Note: Intermittent means the pain goes away completely between bouts) ?    Constant- can be dull to sharp ?6. SEVERITY: "How bad is the pain?"  (e.g., Scale 1-10; mild, moderate, or severe) ?  - MILD (1-3): doesn't interfere with normal activities, abdomen soft and not tender to touch  ?  - MODERATE (4-7): interferes with normal activities or awakens from sleep, abdomen tender to touch  ?  - SEVERE (8-10): excruciating pain, doubled over, unable to do any normal activities  ?    severe ?7. RECURRENT SYMPTOM: "Have you ever had this type of stomach pain before?" If Yes, ask: "When was the last time?" and "What happened that time?"  ?    Hx abdominal ?8. CAUSE: "What do you think is causing the stomach pain?" ?    unsure ?9. RELIEVING/AGGRAVATING FACTORS: "What makes it better or worse?" (e.g., movement, antacids, bowel movement) ?    Passing gas, bloating. pepto ?10. OTHER SYMPTOMS: "Do you have any other symptoms?" (e.g., back pain, diarrhea, fever, urination pain, vomiting) ?      Diarrhea, vomiting ?11. PREGNANCY: "Is there any chance you are pregnant?" "When was your last menstrual period?" ? ?Protocols used:  Abdominal Pain - Female-A-AH ? ?

## 2021-10-01 NOTE — ED Notes (Signed)
[place] sent to lab

## 2021-10-01 NOTE — ED Triage Notes (Signed)
Pt via POV from home. Pt c/o lower abd pain, abd distention, and nausea for the past 5 days. Pt states she has been taking OTC medication and prescription medication. Pt has a hx of liver cirrhosis. States she has never had a paracentesis. States that her levels have been good. States that she has been vomiting and pooping blood. States that its bright red blood. Pt is A&OX4 and NAD.  ?

## 2021-10-01 NOTE — Telephone Encounter (Signed)
?  Chief Complaint: severe abdominal pain ?Symptoms: abdominal pain, vomiting, diarrhea ?Frequency: chronic- getting worse ?Pertinent Negatives: Patient denies  ?Disposition: '[x]'$ ED /'[]'$ Urgent Care (no appt availability in office) / '[]'$ Appointment(In office/virtual)/ '[]'$  [facility]/ '[]'$ Home Care/ '[]'$ Refused Recommended Disposition /'[]'$ [facility]/ '[]'$  Follow-up with PCP ?Additional Notes:    ?

## 2021-10-01 NOTE — ED Provider Notes (Signed)
? ?[facility] ?Provider Note ? ? ? None  ?  (approximate) ? ? ?History  ? ?Abdominal Pain ? ? ?HPI ? ?Rhonda Martin is a 46 y.o. female who presents to the ED for evaluation of Abdominal Pain ?  ?I review recent EGD from 2/28 with Dr. Marius Ditch. Portal hypertensive gastropathy and small nonbleeding esophageal varicies.  ?Alocholic cirrhosis, GERD.  ? ?Pt presents to the ED with her husband for evaluation of 4 days of increasing abdominal distention, generalized pain hematemesis and hematochezia.  Reports subjective chills and fevers without documented temperatures.  Denies chest pain, syncope or cough.  Denies dysuria.  ? ?Physical Exam  ? ?Triage Vital Signs: ?ED Triage Vitals  ?Enc Vitals Group  ?   BP 10/01/21 1836 127/73  ?   Pulse Rate 10/01/21 1836 93  ?   Resp 10/01/21 1836 16  ?   Temp 10/01/21 1836 98.1 ?F (36.7 ?C)  ?   Temp Source 10/01/21 1836 Oral  ?   SpO2 10/01/21 1836 94 %  ?   Weight 10/01/21 1833 145 lb (65.8 kg)  ?   Height 10/01/21 1833 '5\' 2"'$  (1.575 m)  ?   Head Circumference --   ?   Peak Flow --   ?   Pain Score 10/01/21 1833 9  ?   Pain Loc --   ?   Pain Edu? --   ?   Excl. in [place]? --   ? ? ?Most recent vital signs: ?Vitals:  ? 10/01/21 1836 10/01/21 1900  ?BP: 127/73 129/82  ?Pulse: 93 93  ?Resp: 16 14  ?Temp: 98.1 ?F (36.7 ?C)   ?SpO2: 94% 94%  ? ? ?General: Awake, no distress.  Appears uncomfortable. ?CV:  Good peripheral perfusion.  Tachycardic and regular ?Resp:  Normal effort.  Some mild expiratory wheezes diffusely ?Abd:  Mildly distended.  Not tense.  Diffusely tender without localizing features ?MSK:  No deformity noted.  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 ?Other:   ? ? ?ED Results / Procedures / Treatments  ? ?Labs ?(all labs ordered are listed, but only abnormal results are displayed) ?Labs Reviewed  ?COMPREHENSIVE METABOLIC PANEL - Abnormal; Notable for the following components:  ?    Result Value  ? CO2 21 (*)   ?  Glucose, Bld 124 (*)   ? Creatinine, Ser 0.37 (*)   ? Total Protein 8.4 (*)   ? AST 125 (*)   ? ALT 70 (*)   ? Total Bilirubin 1.3 (*)   ? All other components within normal limits  ?CBC - Abnormal; Notable for the following components:  ? Platelets 87 (*)   ? All other components within normal limits  ?URINALYSIS, ROUTINE W REFLEX MICROSCOPIC - Abnormal; Notable for the following components:  ? Color, Urine STRAW (*)   ? APPearance CLEAR (*)   ? Specific Gravity, Urine 1.003 (*)   ? All other components within normal limits  ?LIPASE, BLOOD  ?PROTIME-INR  ?POC URINE PREG, ED  ?TYPE AND SCREEN  ? ? ?EKG ? ? ?RADIOLOGY ?CXR reviewed by me without evidence of acute cardiopulmonary pathology. ? ?Official radiology report(s): ?CT ABDOMEN PELVIS W CONTRAST ? ?Result Date: 10/01/2021 ?CLINICAL DATA:  Cirrhosis, progressive abdominal distension, diffuse abdominal pain EXAM: CT ABDOMEN AND PELVIS WITH CONTRAST TECHNIQUE: Multidetector CT imaging of the abdomen and pelvis was performed using the standard protocol following bolus administration of intravenous contrast. RADIATION DOSE  REDUCTION: This exam was performed according to the departmental dose-optimization program which includes automated exposure control, adjustment of the mA and/or kV according to patient size and/or use of iterative reconstruction technique. CONTRAST:  24m OMNIPAQUE IOHEXOL 350 MG/ML SOLN COMPARISON:  08/19/2020 FINDINGS: Lower chest: No acute abnormality. Hepatobiliary: The liver contour is diffusely nodular in keeping with changes of cirrhosis. Contour nodularity has progressed in the interval since prior examination in keeping with progressive change. Superimposed hepatic steatosis. No enhancing intrahepatic masses are identified. No intra or extrahepatic biliary ductal dilation. Cholelithiasis without pericholecystic inflammatory change noted. The portal vein is patent. Pancreas: Unremarkable Spleen: Normal in size without focal abnormality.  Adrenals/Urinary Tract: Adrenal glands are unremarkable. Kidneys are normal, without renal calculi, focal lesion, or hydronephrosis. Bladder is unremarkable. Stomach/Bowel: Stomach is within normal limits. Appendix appears normal. No evidence of bowel wall thickening, distention, or inflammatory changes. No free intraperitoneal gas or fluid. Vascular/Lymphatic: Moderate aortoiliac atherosclerotic calcification. No aortic aneurysm. Recanalization of the umbilical vein is noted. Abdominal vasculature is otherwise unremarkable. No pathologic adenopathy within the abdomen and pelvis. Reproductive: Uterus and bilateral adnexa are unremarkable. Other: No abdominal wall hernia. Musculoskeletal: No acute bone abnormality. Remote anterior wedge compression deformities L1 and T10 as well as superior endplate fracture of L3 are again identified and are stable since prior examination. No lytic or blastic bone lesions. IMPRESSION: No acute intra-abdominal pathology identified. No definite radiographic explanation for the patient's reported symptoms. Progressive cirrhotic change of the liver. No focal intrahepatic mass. Recanalization of the umbilical vein in keeping with changes of portal venous hypertension. Cholelithiasis. Stable compression deformities of the thoracolumbar spine. No acute bone abnormality. Aortic Atherosclerosis (ICD10-I70.0). Electronically Signed   By: AFidela SalisburyM.D.   On: 10/01/2021 20:34  ? ?DG Chest Portable 1 View ? ?Result Date: 10/01/2021 ?CLINICAL DATA:  Shortness of breath EXAM: PORTABLE CHEST 1 VIEW COMPARISON:  07/14/2021 FINDINGS: The heart size and mediastinal contours are within normal limits. Both lungs are clear. The visualized skeletal structures are unremarkable. IMPRESSION: No active disease. Electronically Signed   By: PElmer PickerM.D.   On: 10/01/2021 19:36   ? ?PROCEDURES and INTERVENTIONS: ? ?.1-3 Lead EKG Interpretation ?Performed by: SVladimir Crofts MD ?Authorized by:  SVladimir Crofts MD  ? ?  Interpretation: normal   ?  ECG rate:  96 ?  ECG rate assessment: normal   ?  Rhythm: sinus rhythm   ?  Ectopy: none   ?  Conduction: normal   ? ?Medications  ?pantoprazole (PROTONIX) 80 mg /NS 100 mL IVPB (has no administration in time range)  ?octreotide (SANDOSTATIN) 2 mcg/mL load via infusion 50 mcg (has no administration in time range)  ?  And  ?octreotide (SANDOSTATIN) 500 mcg in sodium chloride 0.9 % 250 mL (2 mcg/mL) infusion (has no administration in time range)  ?HYDROmorphone (DILAUDID) injection 1 mg (has no administration in time range)  ?cefTRIAXone (ROCEPHIN) 1 g in sodium chloride 0.9 % 100 mL IVPB (1 g Intravenous New Bag/Given 10/01/21 1919)  ?HYDROmorphone (DILAUDID) injection 1 mg (1 mg Intravenous Given 10/01/21 1918)  ?iohexol (OMNIPAQUE) 350 MG/ML injection 75 mL (75 mLs Intravenous Contrast Given 10/01/21 2010)  ? ? ? ?IMPRESSION / MDM / ASSESSMENT AND PLAN / ED COURSE  ?I reviewed the triage vital signs and the nursing notes. ? ?46year old alcoholic cirrhotic presents to the ED with a few days of abdominal pain, hematochezia and hematemesis requiring medical admission.  Reassuring vital signs without instability.  Has diffuse  abdominal tenderness without localizing features.  She does have respiratory wheezes, but refuses my offer for DuoNebs.  Blood work is surprisingly reassuring with normal hemoglobin, mild LFT derangements likely due to her cirrhosis, noninfectious urine.  No signs of sepsis.  CT abdomen/pelvis without clear evidence of acute pathology.  CXR is clear.  While her hemoglobin is normal, considering her history of cirrhosis and varices, started the patient on ceftriaxone, PPI and octreotide.  We will consult with medicine for admission and GI evaluation ? ?Clinical Course as of 10/01/21 2052  ?Wed Oct 01, 2021  ?2045 Reassessed.  Requesting some additional pain medicine.  We discussed work-up and disposition.  She is in agreement. [DS]  ?  ?Clinical  Course User Index ?[DS] Vladimir Crofts, MD  ? ? ? ?FINAL CLINICAL IMPRESSION(S) / ED DIAGNOSES  ? ?Final diagnoses:  ?Hematemesis with nausea  ?Generalized abdominal pain  ? ? ? ?Rx / DC Orders  ? ?ED Discharge Orde
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
## 2021-10-02 ENCOUNTER — Encounter: Payer: Self-pay | Admitting: Gastroenterology

## 2021-10-02 DIAGNOSIS — I472 Ventricular tachycardia, unspecified: Secondary | ICD-10-CM

## 2021-10-02 DIAGNOSIS — R1084 Generalized abdominal pain: Secondary | ICD-10-CM | POA: Diagnosis not present

## 2021-10-02 LAB — HIV ANTIBODY (ROUTINE TESTING W REFLEX): HIV Screen 4th Generation wRfx: NONREACTIVE

## 2021-10-02 LAB — TYPE AND SCREEN
ABO/RH(D): A POS
Antibody Screen: NEGATIVE

## 2021-10-02 MED ORDER — PANTOPRAZOLE INFUSION (NEW) - SIMPLE MED
8.0000 mg/h | INTRAVENOUS | Status: DC
Start: 2021-10-02 — End: 2021-10-02
  Administered 2021-10-01: 8 mg/h via INTRAVENOUS
  Filled 2021-10-02: qty 100

## 2021-10-02 NOTE — TOC CM/SW Note (Signed)
Patient has orders to discharge home today. Chart reviewed. PCP is Charlynne Cousins, MD. On room air. No wounds. No TOC needs identified. ? ?[place] Scrape, [place] ?[phone] ? ?

## 2021-10-02 NOTE — ED Notes (Signed)
Pt placed on purewick 

## 2021-10-02 NOTE — Progress Notes (Signed)
IVs removed from patient. Discharge instructions given. Verbalized understanding. No acute distress at this time. Patient states her pain medicine has been on back order from [facility] in [facility] for a week now and that she has none at home. Requesting if provider can send her a small amount to [facility] on [address] while she awaits her prescription to be filled. Attending MD made aware. Per attending MD, patient will have to contact prescribing provider.  ?

## 2021-10-02 NOTE — Discharge Summary (Signed)
Rhonda Martin MBW:[id number] DOB: May 24, 1976 DOA: 10/01/2021 ? ?PCP: Charlynne Cousins, MD ? ?Admit date: 10/01/2021 ?Discharge date: 10/02/2021 ? ?Time spent: 35 minutes ? ?Recommendations for Outpatient Follow-up:  ?GI f/u next week as scheduled  ? ? ? ?Discharge Diagnoses:  ?Principal Problem: ?  Abdominal pain ?Active Problems: ?  Generalized anxiety disorder ?  Anxious depression ?  Transaminitis ?  Thrombocytopenia ([place]) ?  Alcohol abuse ?  Tobacco abuse ?  Weakness ?  COPD (chronic obstructive pulmonary disease) ([place]) ?  Esophageal varices in cirrhosis (H[facility]) ?  AKI (acute kidney injury) ([place]) ?  Tobacco use disorder, moderate, dependence ?  Ventricular tachycardia ([place]) ? ? ?Discharge Condition: stable ? ?Diet recommendation: heart healthy ? ?Filed Weights  ? 10/01/21 1833  ?Weight: 65.8 kg  ? ? ?History of present illness:  ?From admission h and p by dr. Posey Pronto: ?Rhonda Martin is a 46 y.o. female with medical history significant of cirrhosis and alcohol abuse and cirrhosis with abd pain that started few days ago.  Pt states she has has hematemesis and rectal bleeding. ?The pain is diffuse and 10/10, and abd distension.  Pt continues to drink.  ?Pt takes omeprazole. Pt has seen Dr. Marius Ditch.  ?Pt takes a lot of OTC stomach medication.  ?Pt also takes some gasex.  ?Pt has had stomach pain for several year. ?Pt cant takes ibuprofen.  ?Pt drinks few beers takes for pain.  ?It is difficult to get in HPI patient is anxious is having pressured speech and continues to speak and going off on multiple tangents.  HPI is limited but is from what she was saying intermittently about abdominal pain being tired having abdominal distention having blood in the stool. ? ?Hospital Course:  ?Patient was admitted with abdominal pain and complaint of hematemesis and hematochezia. Lab evaluation and CT of the abdomen/pelvis without acute findings. No vomiting or diarrhea while inpatient. Vitals within normal limits. Abdominal exam is benign.  This is chronic abdominal pain and is followed by GI for this, she has f/u with GI in one week and I encouraged her to make this appointment. On further questioning patient reports that her hemetemesis was a solitary episode one week ago of a streak of blood in her vomit. She had an EGD 2 months ago which showed small varices, no intervention. Her hgb here is wnl. Given the recent endoscopy and this benign-sounding story of hemetemesis I declined further w/u here but gave strict return precautions, and patient will f/u with GI in one week. She had a colonoscopy 5 months ago that showed hemorrhoids and two small polyps. She has had intermittent hematochezia for some time and her current hematochezia sounds in keeping with this. As she is hemodynamically stable and hgb is normal, I declined to involve GI, and patient will f/u with them as outpatient for this chronic problem in one week. Admitting provider consulted psychiatry who essentially did a safety evaluation, patient deemed to not be a safety risk to self or others.  ? ?Procedures: ?none  ? ?Consultations: ?psychiatry ? ?Discharge Exam: ?Vitals:  ? 10/02/21 0430 10/02/21 0700  ?BP: 131/84 129/82  ?Pulse: 78 76  ?Resp: 16 10  ?Temp:    ?SpO2: 93% 98%  ? ? ?General: NAD ?Cardiovascular: RRR ?Respiratory: CTAB ?Abdomen: soft, no rebound or guarding, diffuse mild tenderness to deep palpation ? ?Discharge Instructions ? ? ?Discharge Instructions   ? ? Diet - low sodium heart healthy   Complete by: As directed ?  ?  Increase activity slowly   Complete by: As directed ?  ? ?  ? ?Allergies as of 10/02/2021   ? ?   Reactions  ? Tylenol [acetaminophen] Other (See Comments)  ? Liver disease  ? ?  ? ?  ?Medication List  ?  ? ?TAKE these medications   ? ?buprenorphine 5 MCG/HR Ptwk ?Commonly known as: BUTRANS ?1 patch once a week. ?  ?gabapentin 300 MG capsule ?Commonly known as: NEURONTIN ?Take 300 mg by mouth 2 (two) times daily as needed. ?  ?hydrocortisone 2.5 % rectal  cream ?Commonly known as: ANUSOL-HC ?Place 1 application. rectally 2 (two) times daily. ?  ?lidocaine 5 % ?Commonly known as: LIDODERM ?Place 1 patch onto the skin daily. Remove & Discard patch within 12 hours or as directed by MD ?  ?multivitamin with minerals Tabs tablet ?Take 1 tablet by mouth daily. ?  ?omeprazole 40 MG capsule ?Commonly known as: PRILOSEC ?Take 1 capsule (40 mg total) by mouth 2 (two) times daily before a meal. ?  ?oxyCODONE 5 MG immediate release tablet ?Commonly known as: Oxy IR/ROXICODONE ?Take 5 mg by mouth 3 (three) times daily as needed for pain. ?  ?sucralfate 1 GM/10ML suspension ?Commonly known as: CARAFATE ?Take 10 mLs (1 g total) by mouth 4 (four) times daily as needed. As needed ?  ? ?  ? ?Allergies  ?Allergen Reactions  ? Tylenol [Acetaminophen] Other (See Comments)  ?  Liver disease  ? ? Follow-up Information   ? ? Lin Landsman, MD Follow up.   ?Specialty: Gastroenterology ?Why: As scheduled ?Contact information: ?[place] [address] ?[phone] ? ? ?  ?  ? ?  ?  ? ?  ? ? ? ?The results of significant diagnostics from this hospitalization (including imaging, microbiology, ancillary and laboratory) are listed below for reference.   ? ?Significant Diagnostic Studies: ?CT ABDOMEN PELVIS W CONTRAST ? ?Result Date: 10/01/2021 ?CLINICAL DATA:  Cirrhosis, progressive abdominal distension, diffuse abdominal pain EXAM: CT ABDOMEN AND PELVIS WITH CONTRAST TECHNIQUE: Multidetector CT imaging of the abdomen and pelvis was performed using the standard protocol following bolus administration of intravenous contrast. RADIATION DOSE REDUCTION: This exam was performed according to the departmental dose-optimization program which includes automated exposure control, adjustment of the mA and/or kV according to patient size and/or use of iterative reconstruction technique. CONTRAST:  2m OMNIPAQUE IOHEXOL 350 MG/ML SOLN COMPARISON:  08/19/2020 FINDINGS: Lower chest: No  acute abnormality. Hepatobiliary: The liver contour is diffusely nodular in keeping with changes of cirrhosis. Contour nodularity has progressed in the interval since prior examination in keeping with progressive change. Superimposed hepatic steatosis. No enhancing intrahepatic masses are identified. No intra or extrahepatic biliary ductal dilation. Cholelithiasis without pericholecystic inflammatory change noted. The portal vein is patent. Pancreas: Unremarkable Spleen: Normal in size without focal abnormality. Adrenals/Urinary Tract: Adrenal glands are unremarkable. Kidneys are normal, without renal calculi, focal lesion, or hydronephrosis. Bladder is unremarkable. Stomach/Bowel: Stomach is within normal limits. Appendix appears normal. No evidence of bowel wall thickening, distention, or inflammatory changes. No free intraperitoneal gas or fluid. Vascular/Lymphatic: Moderate aortoiliac atherosclerotic calcification. No aortic aneurysm. Recanalization of the umbilical vein is noted. Abdominal vasculature is otherwise unremarkable. No pathologic adenopathy within the abdomen and pelvis. Reproductive: Uterus and bilateral adnexa are unremarkable. Other: No abdominal wall hernia. Musculoskeletal: No acute bone abnormality. Remote anterior wedge compression deformities L1 and T10 as well as superior endplate fracture of L3 are again identified and are stable since prior  examination. No lytic or blastic bone lesions. IMPRESSION: No acute intra-abdominal pathology identified. No definite radiographic explanation for the patient's reported symptoms. Progressive cirrhotic change of the liver. No focal intrahepatic mass. Recanalization of the umbilical vein in keeping with changes of portal venous hypertension. Cholelithiasis. Stable compression deformities of the thoracolumbar spine. No acute bone abnormality. Aortic Atherosclerosis (ICD10-I70.0). Electronically Signed   By: Fidela Salisbury M.D.   On: 10/01/2021 20:34   ? ?DG Chest Portable 1 View ? ?Result Date: 10/01/2021 ?CLINICAL DATA:  Shortness of breath EXAM: PORTABLE CHEST 1 VIEW COMPARISON:  07/14/2021 FINDINGS: The heart size and mediastinal contours are within norm
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
## 2021-10-02 NOTE — ED Notes (Signed)
Protonix ordered from pharmacy 

## 2021-10-03 ENCOUNTER — Telehealth: Payer: Self-pay

## 2021-10-03 NOTE — Telephone Encounter (Signed)
Transition Care Management Follow-up Telephone Call ?Date of discharge and from where: 10/02/2021 from [facility] ?How have you been since you were released from the hospital? Patient stated that she is feeling okay and did not have any questions or concerns at this time.  ?Any questions or concerns? No ? ?Items Reviewed: ?Did the pt receive and understand the discharge instructions provided? Yes  ?Medications obtained and verified? Yes  ?Other? No  ?Any new allergies since your discharge? No  ?Dietary orders reviewed? No ?Do you have support at home? Yes  ? ?Functional Questionnaire: (I = Independent and D = Dependent) ?ADLs: I ? ?Bathing/Dressing- I ? ?Meal Prep- I ? ?Eating- I ? ?Maintaining continence- I ? ?Transferring/Ambulation- I ? ?Managing Meds- I ? ? ?Follow up appointments reviewed: ? ?PCP Hospital f/u appt confirmed? No   ?Specialist Hospital f/u appt confirmed? Yes  Scheduled to see [facility] on 10/23/2021 @ 2:00pm. ?Are transportation arrangements needed? No  ?If their condition worsens, is the pt aware to call PCP or go to the Emergency Dept.? Yes ?Was the patient provided with contact information for the PCP's office or ED? Yes ?Was to pt encouraged to call back with questions or concerns? Yes ? ? ?

## 2021-10-17 ENCOUNTER — Ambulatory Visit: Admission: RE | Admit: 2021-10-17 | Payer: Medicaid Other | Source: Ambulatory Visit

## 2021-10-21 DIAGNOSIS — Z79899 Other long term (current) drug therapy: Secondary | ICD-10-CM | POA: Diagnosis not present

## 2021-10-21 DIAGNOSIS — R768 Other specified abnormal immunological findings in serum: Secondary | ICD-10-CM | POA: Diagnosis not present

## 2021-10-21 DIAGNOSIS — M545 Low back pain, unspecified: Secondary | ICD-10-CM | POA: Diagnosis not present

## 2021-10-21 DIAGNOSIS — G8929 Other chronic pain: Secondary | ICD-10-CM | POA: Diagnosis not present

## 2021-10-23 ENCOUNTER — Ambulatory Visit: Payer: Medicaid Other | Admitting: Gastroenterology

## 2021-11-21 DIAGNOSIS — G600 Hereditary motor and sensory neuropathy: Secondary | ICD-10-CM | POA: Diagnosis not present

## 2021-11-21 DIAGNOSIS — Z79899 Other long term (current) drug therapy: Secondary | ICD-10-CM | POA: Diagnosis not present

## 2021-11-21 DIAGNOSIS — M545 Low back pain, unspecified: Secondary | ICD-10-CM | POA: Diagnosis not present

## 2021-11-21 DIAGNOSIS — G8929 Other chronic pain: Secondary | ICD-10-CM | POA: Diagnosis not present

## 2021-12-05 ENCOUNTER — Other Ambulatory Visit: Payer: Self-pay | Admitting: Family Medicine

## 2021-12-05 NOTE — Telephone Encounter (Signed)
dc'd 03/25/21 by Dr Neomia Dear for noncompliance  Requested Prescriptions  Refused Prescriptions Disp Refills  . Kenvir 100-5 MCG/ACT AERO [Pharmacy Med Name: DULERA 100 MCG-5 MCG INHALER] 13 each 3    Sig: INHALE 2 PUFFS INTO THE LUNGS IN THE MORNING AND AT BEDTIME.     Pulmonology:  Combination Products Passed - 12/05/2021  2:27 PM      Passed - Valid encounter within last 12 months    Recent Outpatient Visits          4 months ago Bloody stools   [name] Vigg, Avanti, MD   6 months ago IUD (intrauterine device) in place   [place], MD   8 months ago Bloody stool   [place] Vigg, Avanti, MD   1 year ago Primary hypertension   [place] Vigg, Avanti, MD   1 year ago Palpitations   [place], [place], DO

## 2021-12-07 IMAGING — US US EXTREM LOW VENOUS*R*
1 series · 13 of 24 positions shown · non-contrast
Comparison: 12/10/2019

CLINICAL DATA: 44-year-old with right leg swelling.



[Series 1: us venous img lower uni right (dvt) · portal-venous · 46 acquisitions, 13 frames shown]
[im 1/46]
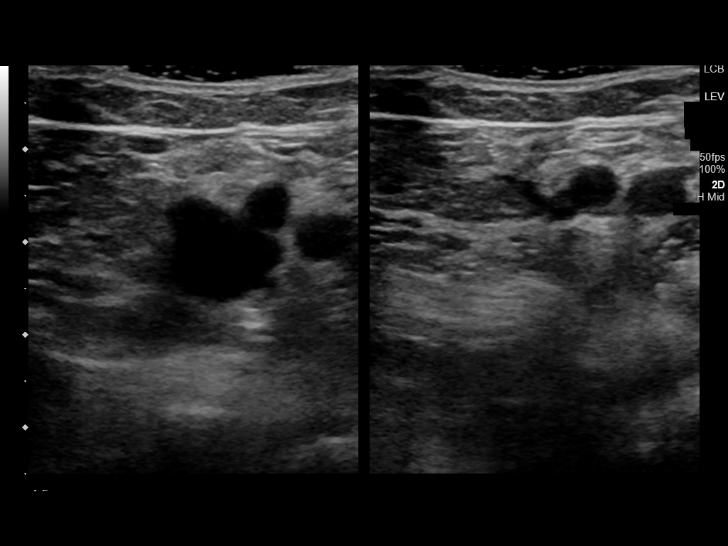
[im 4/46]
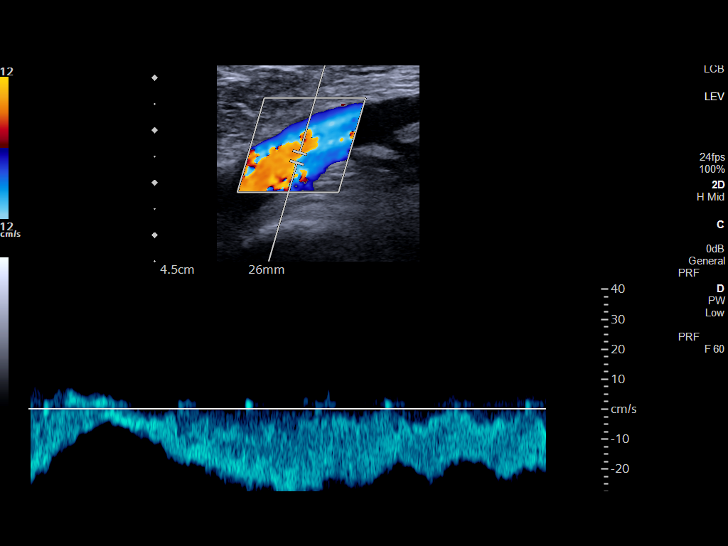
[im 8/46]
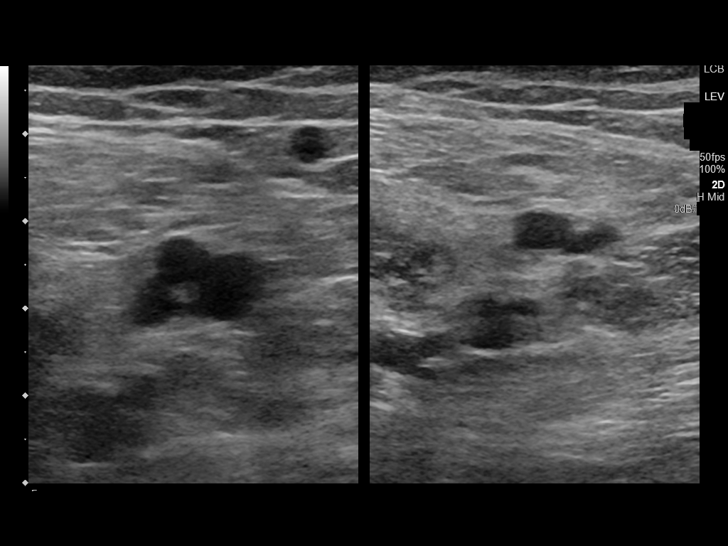
[im 12/46]
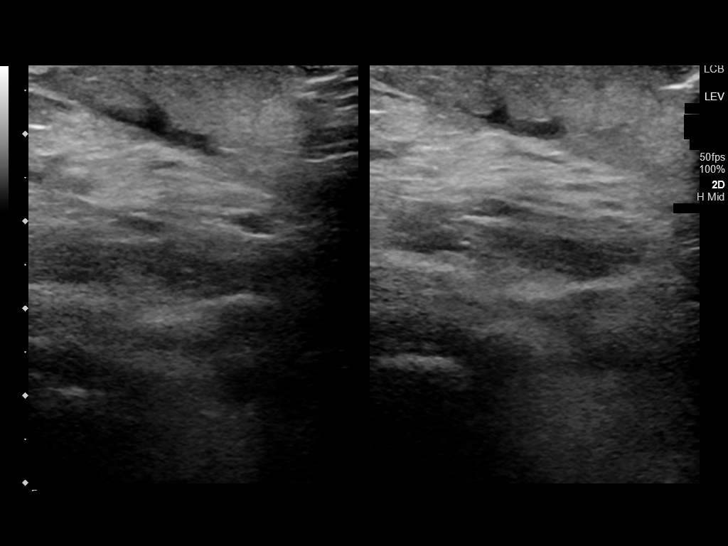
[im 16/46]
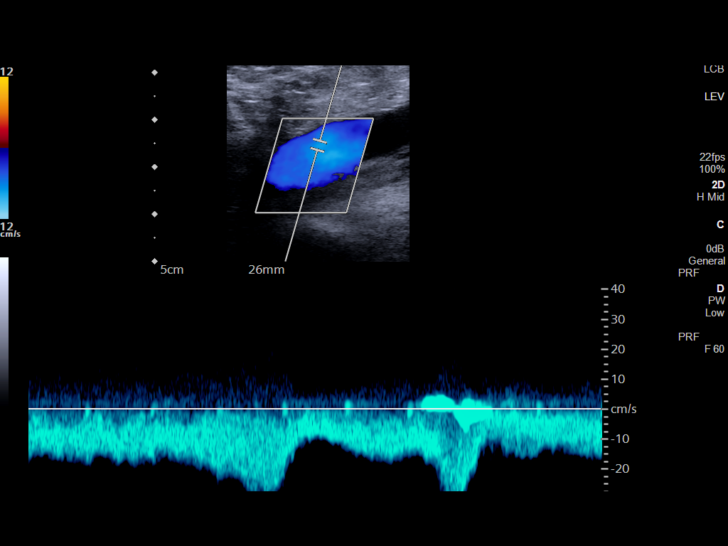
[im 20/46]
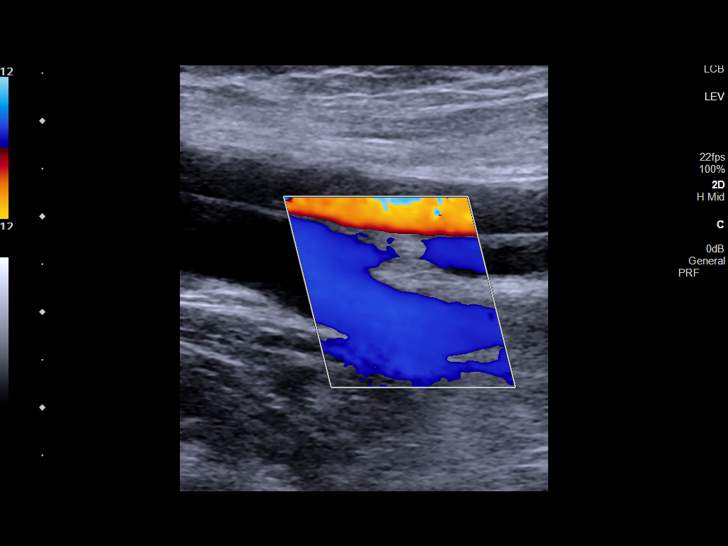
[im 26/46]
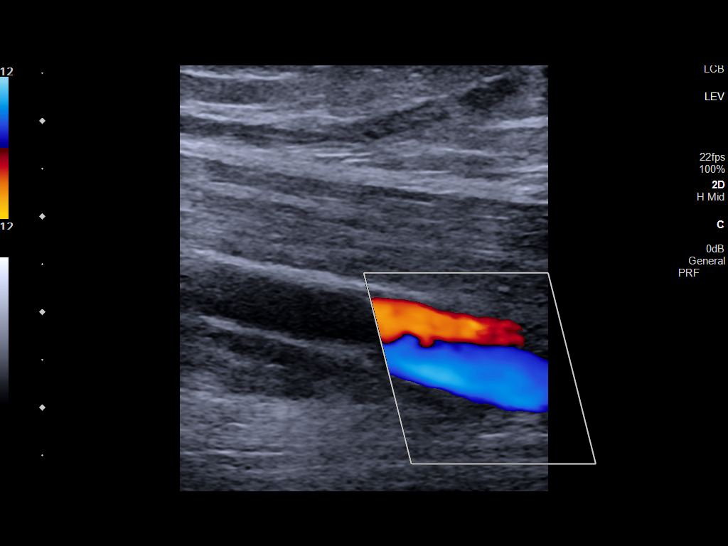
[im 28/46]
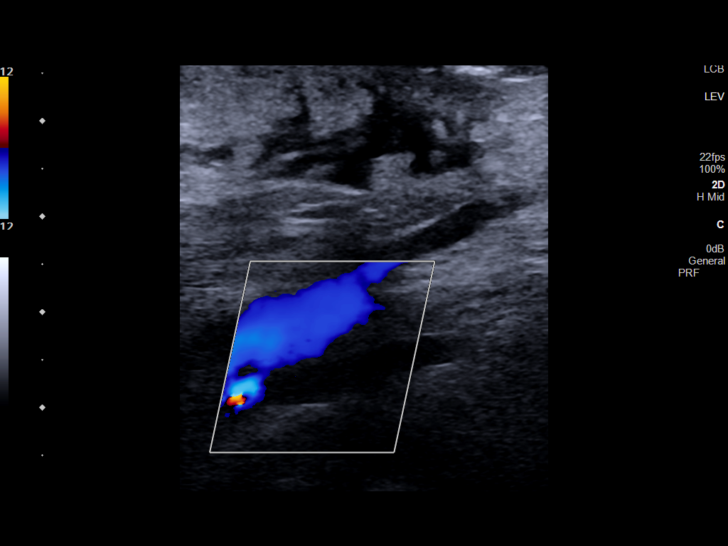
[im 32/46]
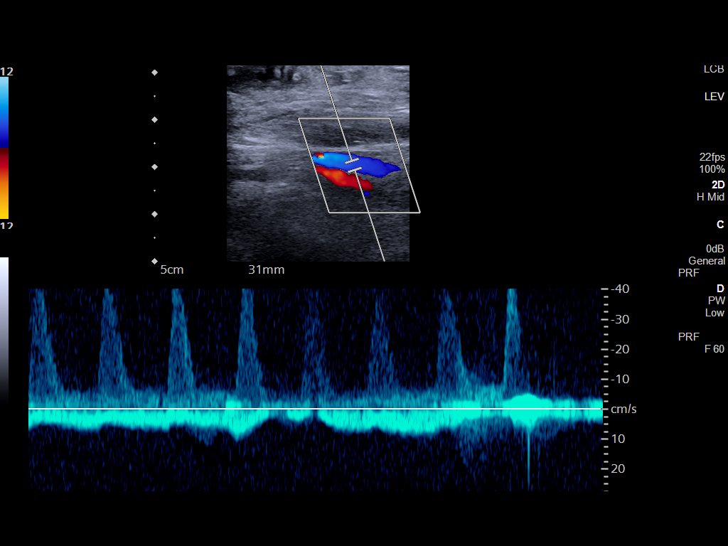
[im 36/46]
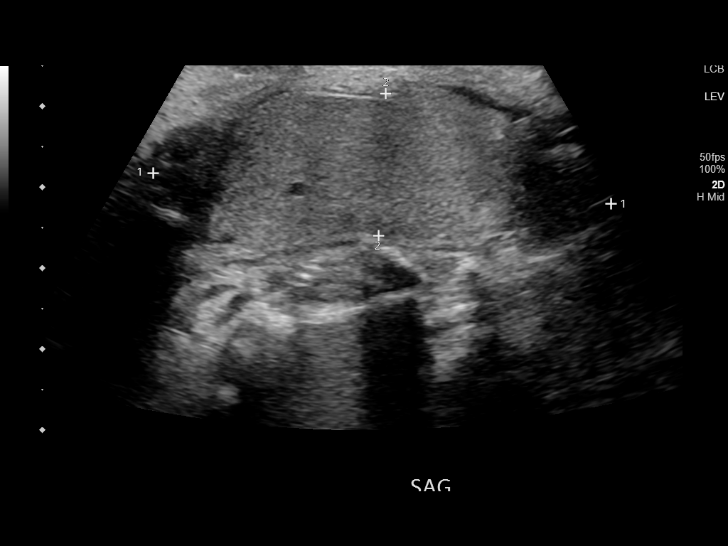
[im 40/46]
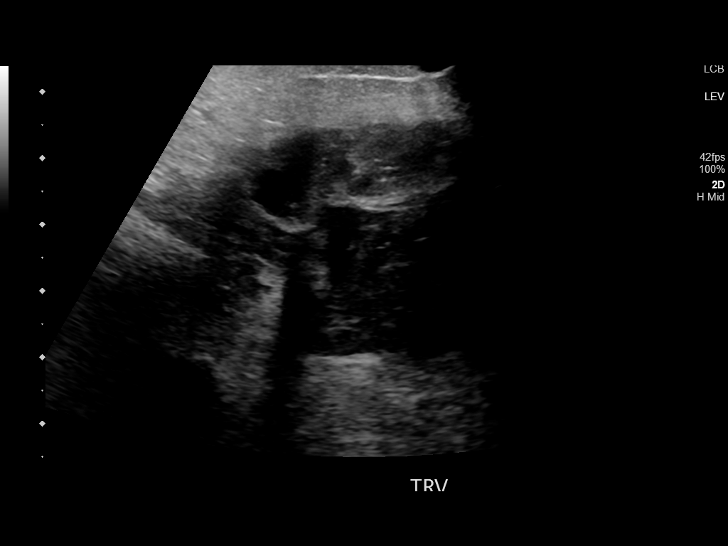
[im 44/46]
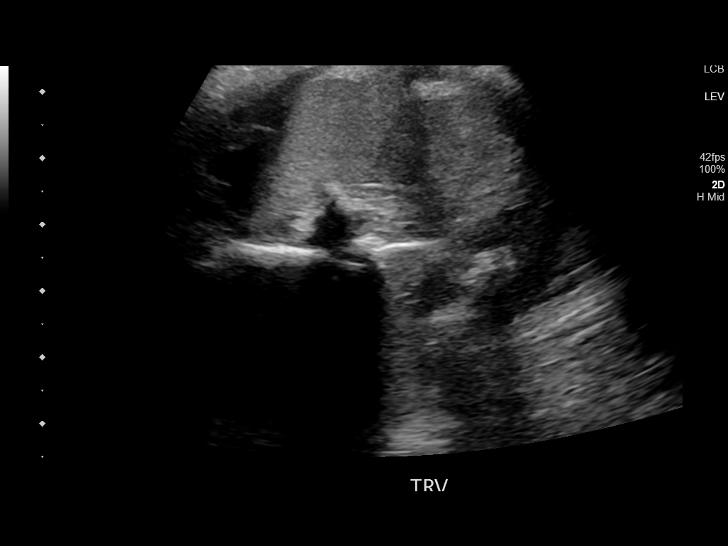
[im 46/46]
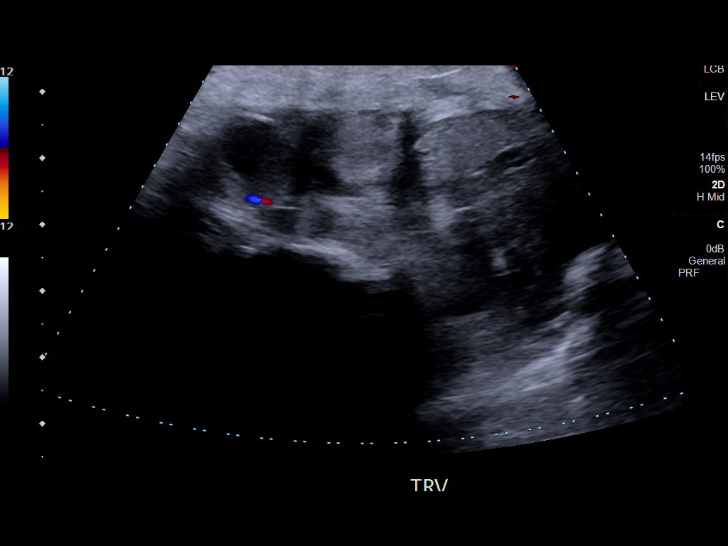

[13 of 24 positions shown; findings below may reference images not displayed]

FINDINGS: Contralateral Common Femoral Vein: Respiratory phasicity is normal
and symmetric with the symptomatic side. No evidence of thrombus.
Normal compressibility.

Common Femoral Vein: No evidence of thrombus. Normal
compressibility, respiratory phasicity and response to augmentation.

Saphenofemoral Junction: No evidence of thrombus. Normal
compressibility and flow on color Doppler imaging.

Profunda Femoral Vein: No evidence of thrombus. Normal
compressibility and flow on color Doppler imaging.

Femoral Vein: No evidence of thrombus. Normal compressibility,
respiratory phasicity and response to augmentation.

Popliteal Vein: No evidence of thrombus. Normal compressibility,
respiratory phasicity and response to augmentation.

Calf Veins: Limited evaluation of the deep calf veins. No clear
evidence of thrombus.

Other Findings: Poorly defined heterogeneous structure along the
medial side of the right knee at the area of redness and pain. This
structure roughly measures 5.7 x 1.8 x 5.7 cm. This structure is
slightly hyperechoic. No significant vascular flow within this
structure.
IMPRESSION: 1. Negative for deep venous thrombosis in right lower extremity.
Limited evaluation of the right deep calf veins due to the edema.
2. Poorly defined heterogeneous structure along the medial aspect of
the right knee at the area of pain and redness. Structure is
indeterminate but favor hematoma. Recommend clinical correlation.

## 2021-12-08 ENCOUNTER — Ambulatory Visit: Payer: Self-pay | Admitting: *Deleted

## 2021-12-08 NOTE — Telephone Encounter (Signed)
  Chief Complaint: SOB Symptoms: SOB with speaking, at rest. Cannot lie down at night. "Vomiting up a lot of mucous. I feel like I'm drowning." Speech halting at times, wheezing. Reports dizziness Frequency: Worsening since Feb when in hospital. Pertinent Negatives: Patient denies  Disposition: '[x]'$ ED /'[]'$ Urgent Care (no appt availability in office) / '[]'$ Appointment(In office/virtual)/ '[]'$  [place] [facility]/ '[]'$ Home Care/ '[]'$ Refused Recommended Disposition /'[]'$ [place] [facility]/ '[]'$  Follow-up with PCP Additional Notes: States will follow ED disposition. Husband will drive. Pt asking who PCP will now be as Dr. Neomia Dear left. Requesting Jolene as she sees her husband Eustaquio Maize Reason for Disposition  [1] MODERATE difficulty breathing (e.g., speaks in phrases, SOB even at rest, pulse 100-120) AND [2] NEW-onset or WORSE than normal  Answer Assessment - Initial Assessment Questions 1. RESPIRATORY STATUS: "Describe your breathing?" (e.g., wheezing, shortness of breath, unable to speak, severe coughing)      SOB Coughing up mucous. 2. ONSET: "When did this breathing problem begin?"       3. PATTERN "Does the difficult breathing come and go, or has it been constant since it started?"      At rest, with exertion 4. SEVERITY: "How bad is your breathing?" (e.g., mild, moderate, severe)    - MILD: No SOB at rest, mild SOB with walking, speaks normally in sentences, can lie down, no retractions, pulse < 100.    - MODERATE: SOB at rest, SOB with minimal exertion and prefers to sit, cannot lie down flat, speaks in phrases, mild retractions, audible wheezing, pulse 100-120.    - SEVERE: Very SOB at rest, speaks in single words, struggling to breathe, sitting hunched forward, retractions, pulse > 120      moderate 5. RECURRENT SYMPTOM: "Have you had difficulty breathing before?" If Yes, ask: "When was the last time?" and "What happened that time?"      Yes. In hospital Feb for SOB 6. CARDIAC HISTORY: "Do  you have any history of heart disease?" (e.g., heart attack, angina, bypass surgery, angioplasty)       7. LUNG HISTORY: "Do you have any history of lung disease?"  (e.g., pulmonary embolus, asthma, emphysema)     yes 8. CAUSE: "What do you think is causing the breathing problem?"       9. OTHER SYMPTOMS: "Do you have any other symptoms? (e.g., dizziness, runny nose, cough, chest pain, fever)     Cough 10. O2 SATURATION MONITOR:  "Do you use an oxygen saturation monitor (pulse oximeter) at home?" If Yes, "What is your reading (oxygen level) today?" "What is your usual oxygen saturation reading?" (e.g., 95%)       No  Protocols used: Breathing Difficulty-A-AH

## 2021-12-19 ENCOUNTER — Telehealth: Payer: Self-pay

## 2021-12-19 NOTE — Telephone Encounter (Signed)
Called patient and advised that Jolene will become her PCP after her visit next week with Kathrine Haddock. Patient acknowledged understanding.

## 2021-12-19 NOTE — Telephone Encounter (Signed)
Sending to you for consideration because patient states you see her husband. Please advise if willing to become patient's PCP.

## 2021-12-19 NOTE — Telephone Encounter (Signed)
Copied from [place] [id number]. Topic: General - Other >> Dec 19, 2021  2:49 PM Leitha Schuller wrote: Reason for CRM: Patient requesting TOC to [facility]   Patient states her husband Tracey Harries is a current pt of Jolene's  Please fu w/ patient

## 2021-12-21 IMAGING — CR DG KNEE 1-2V*R*
2 series · 2 of 2 positions shown · non-contrast
Comparison: A 521

CLINICAL DATA: Right knee arthroplasty 01/11/2020, wound
dehiscence, erythema, drainage

EXAM:
RIGHT KNEE - 1-2 VIEW

[knee ap]
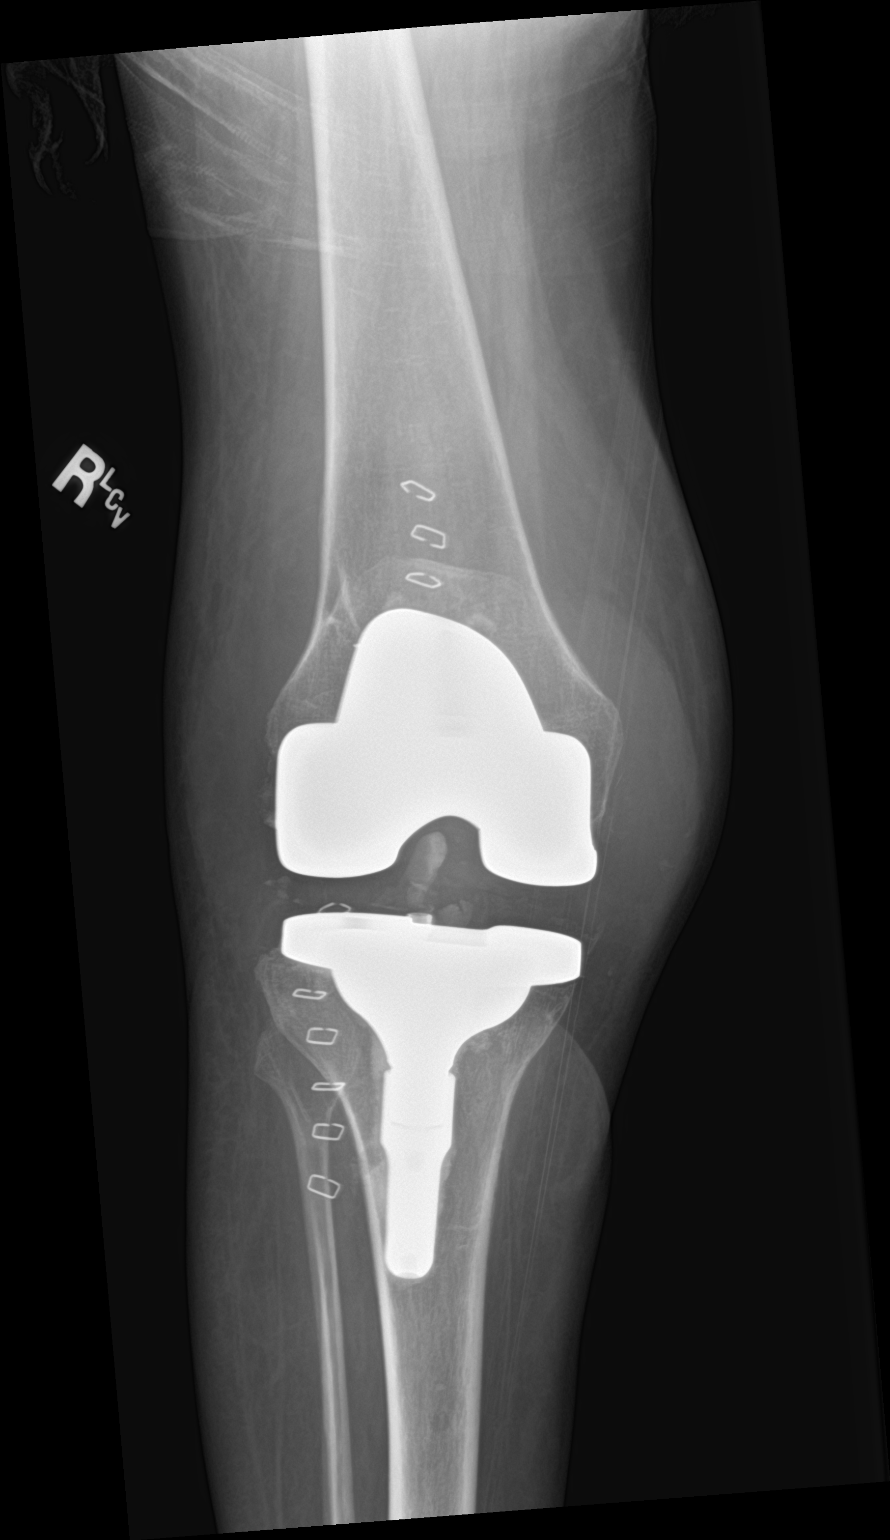

[knee lat]
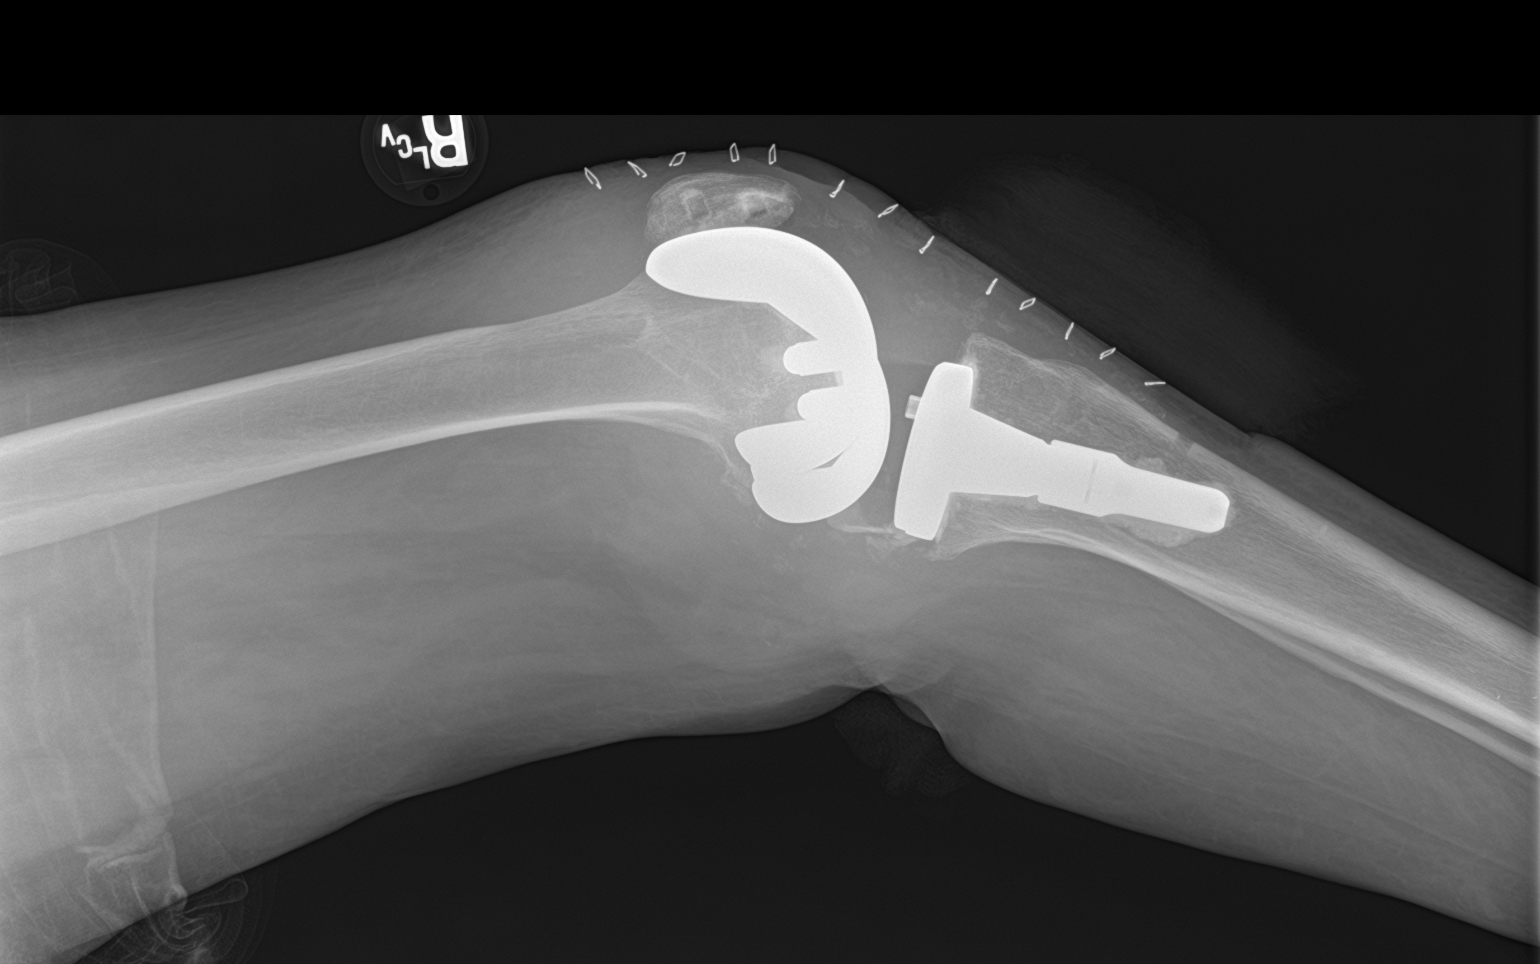

[2 of 2 positions shown; findings below may reference images not displayed]

FINDINGS: Frontal and cross-table lateral views of the right knee are
obtained. The right knee arthroplasty seen previously is in stable
position with no evidence of acute complication. There are no
fractures. The subcutaneous gas and intra-articular gas seen on
prior study have resolved in the interim. There is persistent soft
tissue edema, most pronounced anteriorly. Small residual joint
effusion.
IMPRESSION: 1. Persistent but improving soft tissue edema throughout the right
knee.
2. No acute bony abnormality.
3. No evidence of complication after recent arthroplasty.

## 2021-12-23 ENCOUNTER — Ambulatory Visit: Payer: Medicaid Other | Admitting: Unknown Physician Specialty

## 2021-12-23 ENCOUNTER — Encounter: Payer: Self-pay | Admitting: Unknown Physician Specialty

## 2021-12-23 ENCOUNTER — Telehealth (INDEPENDENT_AMBULATORY_CARE_PROVIDER_SITE_OTHER): Payer: Medicaid Other | Admitting: Unknown Physician Specialty

## 2021-12-23 ENCOUNTER — Ambulatory Visit: Payer: Self-pay | Admitting: *Deleted

## 2021-12-23 DIAGNOSIS — J449 Chronic obstructive pulmonary disease, unspecified: Secondary | ICD-10-CM | POA: Diagnosis not present

## 2021-12-23 MED ORDER — MOMETASONE FURO-FORMOTEROL FUM 200-5 MCG/ACT IN AERO: 2.0000 | INHALATION_SPRAY | Freq: Two times a day (BID) | RESPIRATORY_TRACT | 0 refills | Status: AC

## 2021-12-23 MED ORDER — PREDNISONE 20 MG PO TABS
40.0000 mg | ORAL_TABLET | Freq: Every day | ORAL | 0 refills | Status: DC
Start: 2021-12-23 — End: 2022-01-23

## 2021-12-23 NOTE — Telephone Encounter (Signed)
   Chief Complaint: changing appt Symptoms: sob Frequency: comes and goes Pertinent Negatives: Patient denies na Disposition: '[]'$ ED /'[]'$ Urgent Care (no appt availability in office) / '[x]'$ Appointment(In office/virtual)/ '[]'$  [place] [facility]/ '[]'$ Home Care/ '[]'$ Refused Recommended Disposition /'[]'$ [place] [facility]/ '[]'$  Follow-up with PCP Additional Notes: Pt called due to wanting her appt today to be virtual. Her husband is sick and she is sob. I triaged her to make sure she was not in need of ED visit. Pt very talkative and really just asking for an inhaler. Her visit was changed to virtual via MyChart for same time this afternoon. Pt also asking for referral to pulmonologist. Pt hung up before any home care could be reviewed. Reason for Disposition  [1] MILD difficulty breathing (e.g., minimal/no SOB at rest, SOB with walking) AND [2] worse than normal  Answer Assessment - Initial Assessment Questions 1. MAIN CONCERN OR SYMPTOM: "What's your main concern?" (e.g., low oxygen level, breathing difficulty) "What question do you have?"     SOB, has COPD 2.  OXYGEN EQUIPMENT:  Are you having trouble with your oxygen equipment?  (e.g., cannula, mask, tubing, tank, concentrator)     Out of inhaler 3. ONSET: "When did the  SOB  start?"      Several months off and on 4. OXYGEN THERAPY:    - "Do you currently use home oxygen?" (e.g., yes, no).    - If Yes, ask: "What is your oxygen source?" (e.g., O2 tank, O2 concentrator).    - If Yes, ask: "How do you get the oxygen?" (e.g., nasal prongs, face mask).    - If Yes, ask: "How much oxygen are you supposed to use?" (e.g., 1-2 L [place])     no 5. PULSE OXIMETER:    - "Do you have a pulse oximeter (pulse ox)?"  (e.g., yes, no)    - If Yes, ask: "Where do you place the probe?" (e.g., fingertip, ear lobe)     no 6. O2 MONITORING: "What is the oxygen level (pulse ox reading)?" (e.g., 70-100%)     no 7. VSS MONITORING: "Do you monitor/measure your oxygen  level or vital signs (e.g., yes, no, measurements are automatically sent to provider/call center). Document CURRENT and NORMAL BASELINE values if available.     -  P: "What is your pulse rate per minute?"   -  RR: "What is your respiratory rate per minute?"     no 8. BREATHING DIFFICULTY: "Are you having any difficulty breathing?" If Yes, ask: "How bad is it?"  (e.g., none, mild, moderate, severe)   - MILD: No SOB at rest, mild SOB with walking, speaks normally in sentences, able to lie down, no retractions, pulse < 100.   - MODERATE: SOB at rest, SOB with minimal exertion and prefers to sit, cannot lie down flat, speaks in phrases, mild retractions, audible wheezing, pulse 100-120.   - SEVERE: Very SOB at rest, speaks in single words, struggling to breathe, sitting hunched forward, retractions, pulse > 120      Sometimes severe (talking a lot on the phone currently) 9. OTHER SYMPTOMS: "Do you have any other symptoms?" (e.g., fever, change in sputum)     Occasionally sputum 10. SMOKING: "Do you smoke currently?" "Is there anyone that smokes around you?"  (Note: smoking around oxygen is dangerous!)       Yes, smokes currently she said 4 cigarettes a day.  Protocols used: COPD Oxygen Monitoring and Hypoxia-A-AH

## 2021-12-23 NOTE — Progress Notes (Signed)
There were no vitals taken for this visit.   Subjective:    [name], female    DOB: [birth year], 46 y.o.   MRN: [id number]  HPI: Rhonda Martin is a 46 y.o. female  Chief Complaint  Patient presents with   COPD    Pt states she has been having issues with her COPD. States she has been having to sleep sitting up and has been wheezing a lot. Requesting an inhaler and referral to pulmonology   Due to the catastrophic nature of the COVID-19 pandemic, this visit was completed via audio and visual contact via [place]  All issues as above were discussed and addressed. Physical exam was done as above through visual confirmation on [facility]. If it was felt that the patient should be evaluated in the office, they were directed there. The patient verbally consented to this visit. Location of the patient: home Location of the provider: work Those involved with this call:  Time spent on call: 30 minutes I verified patient identity using two factors (patient name and date of birth). Patient consents verbally to being seen via telemedicine visit today.  COPD She complains of cough, difficulty breathing and shortness of breath. This is a chronic problem. The problem occurs constantly. The problem has been waxing and waning. The cough is productive of sputum, nocturnal, harsh, hacking and vomit inducing. Associated symptoms include dyspnea on exertion, headaches and malaise/fatigue. Pertinent negatives include no nasal congestion, rhinorrhea or sore throat. Her symptoms are aggravated by any activity. Her past medical history is significant for COPD.     Relevant past medical, surgical, family and social history reviewed and updated as indicated. Interim medical history since our last visit reviewed. Allergies and medications reviewed and updated.  Review of Systems  Constitutional:  Positive for malaise/fatigue.  HENT:  Negative for rhinorrhea and sore throat.   Respiratory:   Positive for cough and shortness of breath.   Cardiovascular:  Positive for dyspnea on exertion.  Neurological:  Positive for headaches.    Per HPI unless specifically indicated above     Objective:    There were no vitals taken for this visit.  Wt Readings from Last 3 Encounters:  10/01/21 145 lb (65.8 kg)  09/03/21 153 lb 12.8 oz (69.8 kg)  09/02/21 146 lb (66.2 kg)    Physical Exam Constitutional:      General: She is not in acute distress.    Appearance: She is well-developed. She is ill-appearing.  HENT:     Head: Normocephalic and atraumatic.  Eyes:     General: Lids are normal. No scleral icterus.       Right eye: No discharge.        Left eye: No discharge.     Conjunctiva/sclera: Conjunctivae normal.  Pulmonary:     Effort: Accessory muscle usage present.     Comments: Difficulty breathing Abdominal:     Palpations: There is no hepatomegaly or splenomegaly.  Musculoskeletal:        General: Normal range of motion.  Skin:    Coloration: Skin is not pale.     Findings: No rash.  Neurological:     Mental Status: She is alert and oriented to person, place, and time.  Psychiatric:        Behavior: Behavior normal.        Thought Content: Thought content normal.        Judgment: Judgment normal.     Results for orders  placed or performed during the hospital encounter of 10/01/21  Lipase, blood  Result Value Ref Range   Lipase 37 11 - 51 U/L  Comprehensive metabolic panel  Result Value Ref Range   Sodium 136 135 - 145 mmol/L   Potassium 3.9 3.5 - 5.1 mmol/L   Chloride 102 98 - 111 mmol/L   CO2 21 (L) 22 - 32 mmol/L   Glucose, Bld 124 (H) 70 - 99 mg/dL   BUN 7 6 - 20 mg/dL   Creatinine, Ser 0.37 (L) 0.44 - 1.00 mg/dL   Calcium 8.9 8.9 - 10.3 mg/dL   Total Protein 8.4 (H) 6.5 - 8.1 g/dL   Albumin 4.2 3.5 - 5.0 g/dL   AST 125 (H) 15 - 41 U/L   ALT 70 (H) 0 - 44 U/L   Alkaline Phosphatase 86 38 - 126 U/L   Total Bilirubin 1.3 (H) 0.3 - 1.2 mg/dL   GFR,  Estimated >60 >60 mL/min   Anion gap 13 5 - 15  CBC  Result Value Ref Range   WBC 7.3 4.0 - 10.5 K/uL   RBC 4.71 3.87 - 5.11 MIL/uL   Hemoglobin 14.9 12.0 - 15.0 g/dL   HCT 44.2 36.0 - 46.0 %   MCV 93.8 80.0 - 100.0 fL   MCH 31.6 26.0 - 34.0 pg   MCHC 33.7 30.0 - 36.0 g/dL   RDW 14.7 11.5 - 15.5 %   Platelets 87 (L) 150 - 400 K/uL   nRBC 0.0 0.0 - 0.2 %  Urinalysis, Routine w reflex microscopic Urine, Clean Catch  Result Value Ref Range   Color, Urine STRAW (A) YELLOW   APPearance CLEAR (A) CLEAR   Specific Gravity, Urine 1.003 (L) 1.005 - 1.030   pH 6.0 5.0 - 8.0   Glucose, UA NEGATIVE NEGATIVE mg/dL   Hgb urine dipstick NEGATIVE NEGATIVE   Bilirubin Urine NEGATIVE NEGATIVE   Ketones, ur NEGATIVE NEGATIVE mg/dL   Protein, ur NEGATIVE NEGATIVE mg/dL   Nitrite NEGATIVE NEGATIVE   Leukocytes,Ua NEGATIVE NEGATIVE  Protime-INR  Result Value Ref Range   Prothrombin Time 14.8 11.4 - 15.2 seconds   INR 1.2 0.8 - 1.2  HIV Antibody (routine testing w rflx)  Result Value Ref Range   HIV Screen 4th Generation wRfx Non Reactive Non Reactive  POC urine preg, ED  Result Value Ref Range   Preg Test, Ur NEGATIVE NEGATIVE  Type and screen  Result Value Ref Range   ABO/RH(D) A POS    Antibody Screen NEG    Sample Expiration      10/04/2021,2359 Performed at [facility], [address] [place], [place] [address]       Assessment & Plan:   Problem List Items Addressed This Visit       Unprioritized   COPD (chronic obstructive pulmonary disease) ([place]) - Primary    Severe COPD out of inhalers.  States she has tried a number of inhalers that make things worse.  Ruthe Mannan is one that works.  Prednisone often makes heart rate beat fast as well as other inhalers.  Will rx Dulera, prednisone burst of 40 mg daily for 5 days, and refer to pulmonary.  To the ER if no improvement.  Encouraged her to be seen in person.  Encouraged to purchase a pulse ox      Relevant Medications    predniSONE (DELTASONE) 20 MG tablet   mometasone-formoterol (DULERA) 200-5 MCG/ACT AERO   Other Relevant Orders   Ambulatory referral  to Pulmonology     Follow up plan: Return if symptoms worsen or fail to improve.

## 2021-12-23 NOTE — Assessment & Plan Note (Addendum)
Severe COPD out of inhalers.  States she has tried a number of inhalers that make things worse.  Rhonda Martin is one that works.  Prednisone often makes heart rate beat fast as well as other inhalers.  Will rx Dulera, prednisone burst of 40 mg daily for 5 days, and refer to pulmonary.  To the ER if no improvement.  Encouraged her to be seen in person.  Encouraged to purchase a pulse ox

## 2021-12-25 DIAGNOSIS — Z79899 Other long term (current) drug therapy: Secondary | ICD-10-CM | POA: Diagnosis not present

## 2021-12-25 DIAGNOSIS — Z131 Encounter for screening for diabetes mellitus: Secondary | ICD-10-CM | POA: Diagnosis not present

## 2021-12-25 DIAGNOSIS — Z1159 Encounter for screening for other viral diseases: Secondary | ICD-10-CM | POA: Diagnosis not present

## 2021-12-26 DIAGNOSIS — Z79899 Other long term (current) drug therapy: Secondary | ICD-10-CM | POA: Diagnosis not present

## 2021-12-27 IMAGING — DX DG KNEE 1-2V*R*
2 series · 2 of 2 positions shown · non-contrast
Comparison: 01/28/2020 .

CLINICAL DATA: Status post right knee arthroplasty on 01/24/2020
now with surgical wound dehiscence.

EXAM:
RIGHT KNEE - 1-2 VIEW

[knee ap]
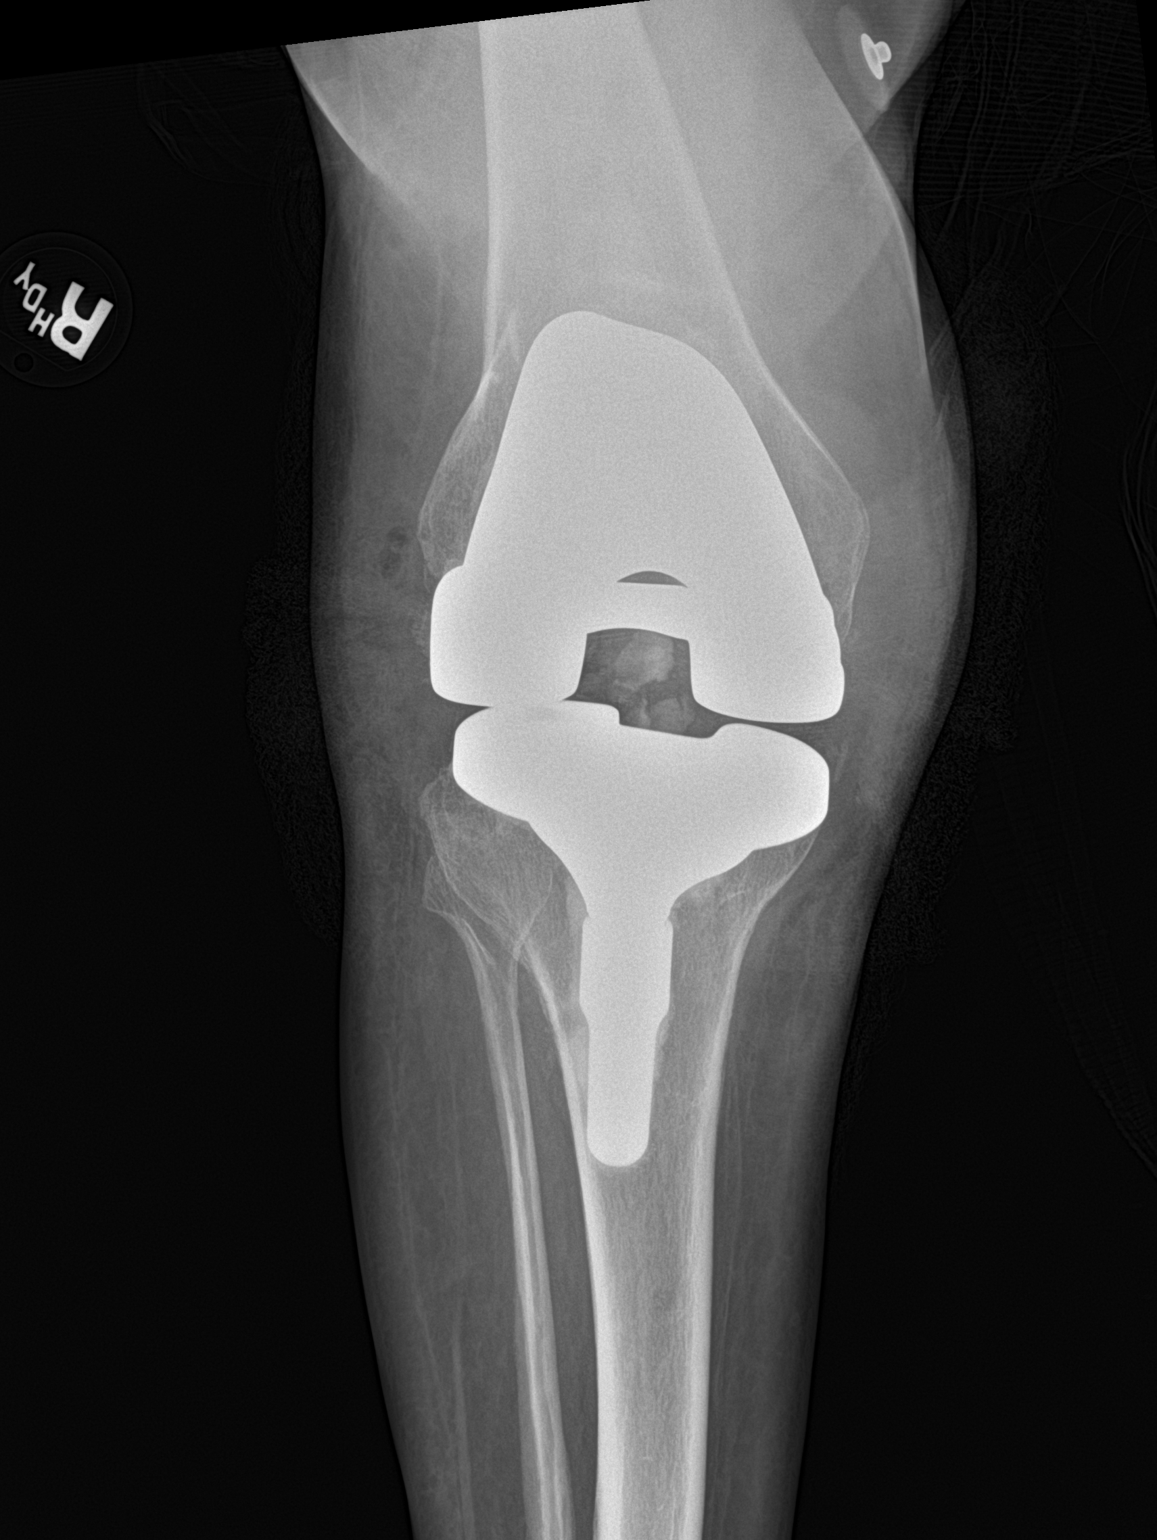

[knee lat]
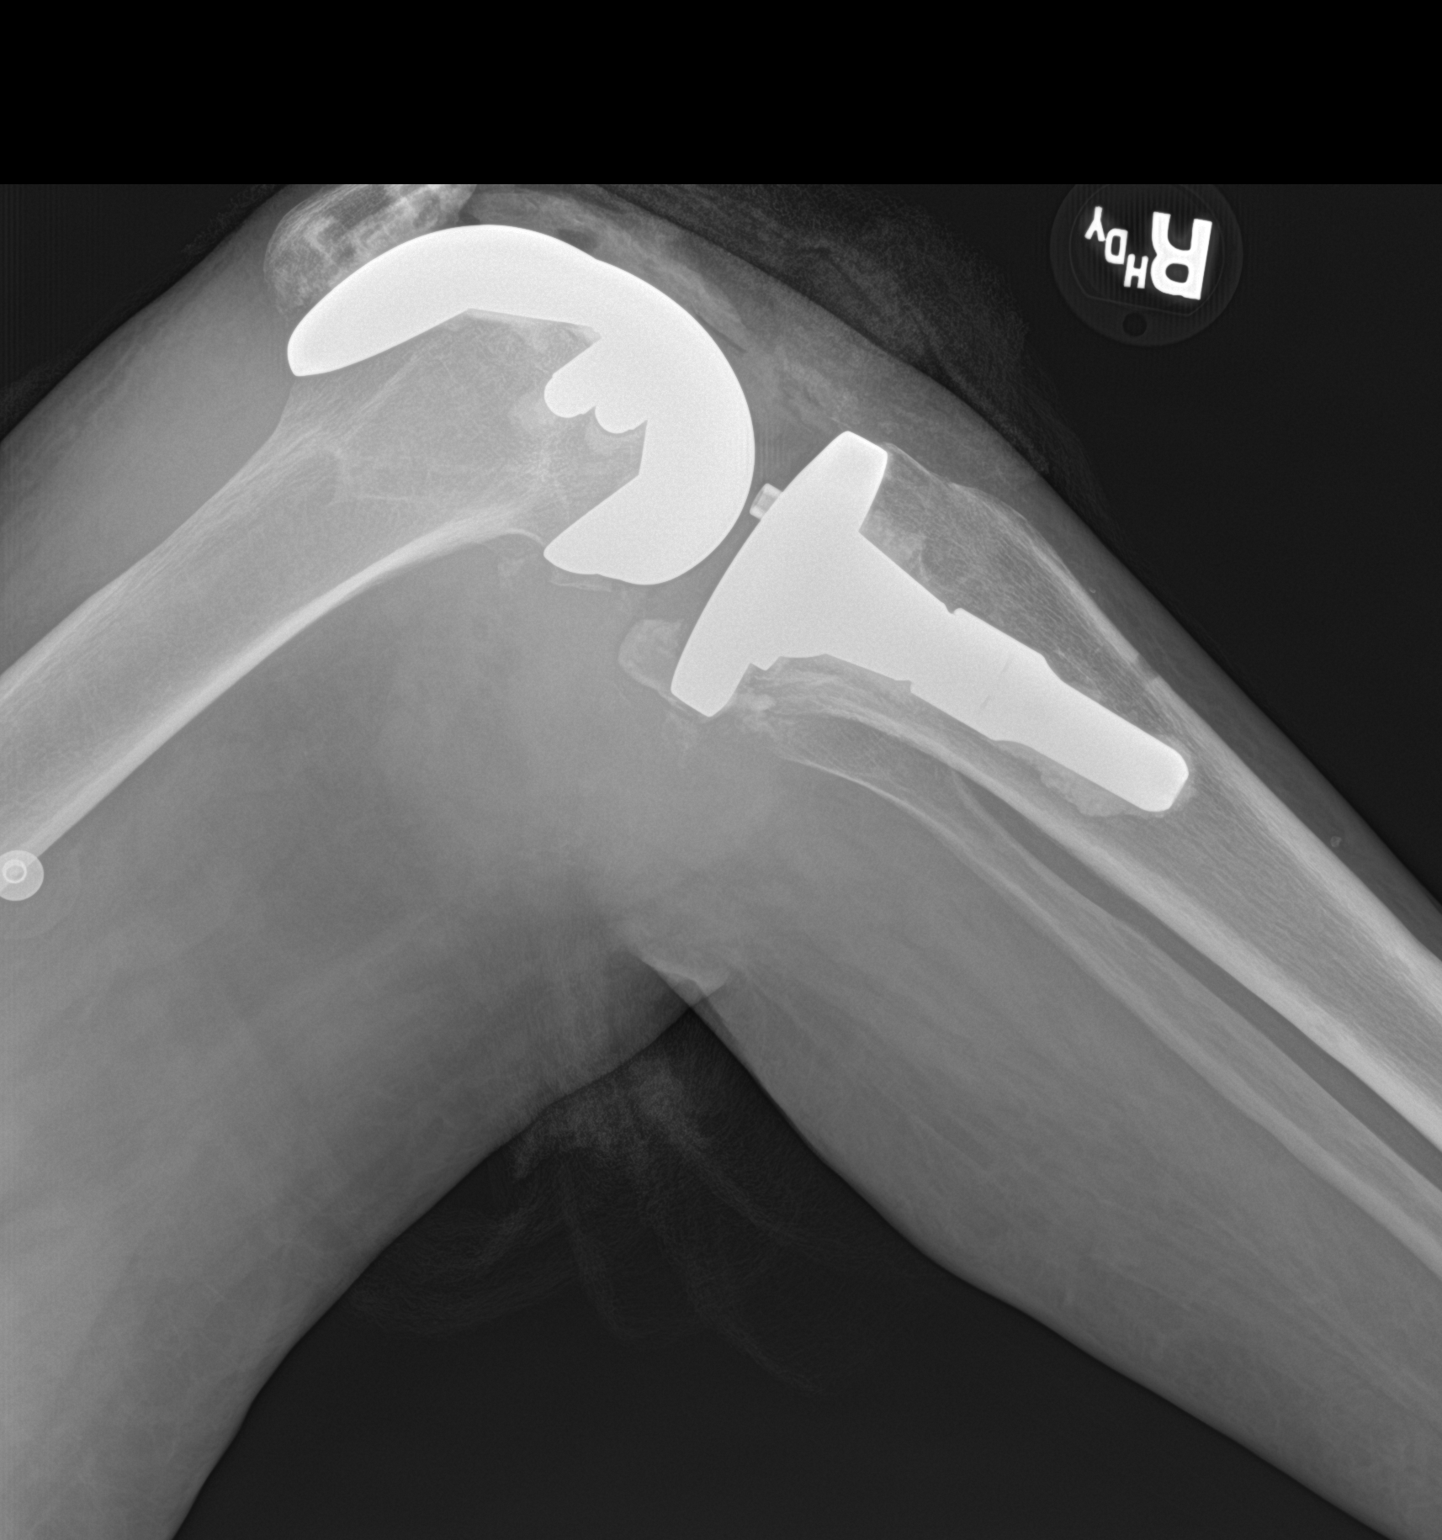

[2 of 2 positions shown; findings below may reference images not displayed]

FINDINGS: Postoperative changes from right total knee arthroplasty identified.
The hardware components remain in anatomic alignment. No
periprosthetic fracture or dislocation. There is diffuse soft tissue
swelling identified. Gas is identified within the surrounding soft
tissues compatible with the history of wound dehiscence
IMPRESSION: 1. Status post right total knee arthroplasty. No hardware
complications identified at this time.
2. Soft tissue swelling and gas compatible with history of wound
dehiscence.

## 2022-01-15 ENCOUNTER — Telehealth: Payer: Self-pay | Admitting: *Deleted

## 2022-01-15 ENCOUNTER — Emergency Department: Payer: Medicaid Other

## 2022-01-15 ENCOUNTER — Other Ambulatory Visit: Payer: Self-pay

## 2022-01-15 ENCOUNTER — Inpatient Hospital Stay
Admission: EM | Admit: 2022-01-15 | Discharge: 2022-01-23 | DRG: 871 | Disposition: A | Discharge status: Home / Self Care | Payer: Medicaid Other | Attending: Internal Medicine | Admitting: Internal Medicine

## 2022-01-15 ENCOUNTER — Encounter: Payer: Self-pay | Admitting: *Deleted

## 2022-01-15 DIAGNOSIS — K219 Gastro-esophageal reflux disease without esophagitis: Secondary | ICD-10-CM | POA: Diagnosis present

## 2022-01-15 DIAGNOSIS — Z136 Encounter for screening for cardiovascular disorders: Secondary | ICD-10-CM | POA: Diagnosis not present

## 2022-01-15 DIAGNOSIS — R079 Chest pain, unspecified: Secondary | ICD-10-CM | POA: Diagnosis not present

## 2022-01-15 DIAGNOSIS — T391X1A Poisoning by 4-Aminophenol derivatives, accidental (unintentional), initial encounter: Secondary | ICD-10-CM | POA: Diagnosis present

## 2022-01-15 DIAGNOSIS — D6959 Other secondary thrombocytopenia: Secondary | ICD-10-CM | POA: Diagnosis present

## 2022-01-15 DIAGNOSIS — K652 Spontaneous bacterial peritonitis: Secondary | ICD-10-CM | POA: Diagnosis present

## 2022-01-15 DIAGNOSIS — J441 Chronic obstructive pulmonary disease with (acute) exacerbation: Secondary | ICD-10-CM | POA: Diagnosis present

## 2022-01-15 DIAGNOSIS — E878 Other disorders of electrolyte and fluid balance, not elsewhere classified: Secondary | ICD-10-CM | POA: Diagnosis present

## 2022-01-15 DIAGNOSIS — E872 Acidosis, unspecified: Secondary | ICD-10-CM | POA: Diagnosis present

## 2022-01-15 DIAGNOSIS — E876 Hypokalemia: Secondary | ICD-10-CM | POA: Diagnosis present

## 2022-01-15 DIAGNOSIS — E7220 Disorder of urea cycle metabolism, unspecified: Secondary | ICD-10-CM | POA: Diagnosis not present

## 2022-01-15 DIAGNOSIS — F419 Anxiety disorder, unspecified: Secondary | ICD-10-CM | POA: Diagnosis present

## 2022-01-15 DIAGNOSIS — K7290 Hepatic failure, unspecified without coma: Secondary | ICD-10-CM | POA: Diagnosis not present

## 2022-01-15 DIAGNOSIS — K7031 Alcoholic cirrhosis of liver with ascites: Secondary | ICD-10-CM

## 2022-01-15 DIAGNOSIS — K766 Portal hypertension: Secondary | ICD-10-CM | POA: Diagnosis present

## 2022-01-15 DIAGNOSIS — J449 Chronic obstructive pulmonary disease, unspecified: Secondary | ICD-10-CM | POA: Diagnosis present

## 2022-01-15 DIAGNOSIS — I10 Essential (primary) hypertension: Secondary | ICD-10-CM | POA: Diagnosis present

## 2022-01-15 DIAGNOSIS — F1010 Alcohol abuse, uncomplicated: Secondary | ICD-10-CM | POA: Diagnosis present

## 2022-01-15 DIAGNOSIS — Z7951 Long term (current) use of inhaled steroids: Secondary | ICD-10-CM

## 2022-01-15 DIAGNOSIS — K7040 Alcoholic hepatic failure without coma: Secondary | ICD-10-CM | POA: Diagnosis present

## 2022-01-15 DIAGNOSIS — R188 Other ascites: Secondary | ICD-10-CM | POA: Diagnosis not present

## 2022-01-15 DIAGNOSIS — I8510 Secondary esophageal varices without bleeding: Secondary | ICD-10-CM | POA: Diagnosis present

## 2022-01-15 DIAGNOSIS — F17210 Nicotine dependence, cigarettes, uncomplicated: Secondary | ICD-10-CM | POA: Diagnosis present

## 2022-01-15 DIAGNOSIS — K7689 Other specified diseases of liver: Secondary | ICD-10-CM | POA: Diagnosis not present

## 2022-01-15 DIAGNOSIS — G8929 Other chronic pain: Secondary | ICD-10-CM | POA: Diagnosis present

## 2022-01-15 DIAGNOSIS — R6520 Severe sepsis without septic shock: Secondary | ICD-10-CM | POA: Diagnosis present

## 2022-01-15 DIAGNOSIS — Z89511 Acquired absence of right leg below knee: Secondary | ICD-10-CM | POA: Diagnosis not present

## 2022-01-15 DIAGNOSIS — G600 Hereditary motor and sensory neuropathy: Secondary | ICD-10-CM | POA: Diagnosis present

## 2022-01-15 DIAGNOSIS — Z20822 Contact with and (suspected) exposure to covid-19: Secondary | ICD-10-CM | POA: Diagnosis present

## 2022-01-15 DIAGNOSIS — K7011 Alcoholic hepatitis with ascites: Secondary | ICD-10-CM | POA: Diagnosis not present

## 2022-01-15 DIAGNOSIS — K8020 Calculus of gallbladder without cholecystitis without obstruction: Secondary | ICD-10-CM | POA: Diagnosis present

## 2022-01-15 DIAGNOSIS — K7030 Alcoholic cirrhosis of liver without ascites: Secondary | ICD-10-CM | POA: Diagnosis not present

## 2022-01-15 DIAGNOSIS — Z8249 Family history of ischemic heart disease and other diseases of the circulatory system: Secondary | ICD-10-CM

## 2022-01-15 DIAGNOSIS — Z96651 Presence of right artificial knee joint: Secondary | ICD-10-CM | POA: Diagnosis present

## 2022-01-15 DIAGNOSIS — Z8541 Personal history of malignant neoplasm of cervix uteri: Secondary | ICD-10-CM

## 2022-01-15 DIAGNOSIS — Z7952 Long term (current) use of systemic steroids: Secondary | ICD-10-CM

## 2022-01-15 DIAGNOSIS — I700 Atherosclerosis of aorta: Secondary | ICD-10-CM | POA: Diagnosis present

## 2022-01-15 DIAGNOSIS — E871 Hypo-osmolality and hyponatremia: Secondary | ICD-10-CM | POA: Diagnosis present

## 2022-01-15 DIAGNOSIS — G894 Chronic pain syndrome: Secondary | ICD-10-CM | POA: Diagnosis present

## 2022-01-15 DIAGNOSIS — R17 Unspecified jaundice: Secondary | ICD-10-CM | POA: Diagnosis not present

## 2022-01-15 DIAGNOSIS — Z7141 Alcohol abuse counseling and surveillance of alcoholic: Secondary | ICD-10-CM

## 2022-01-15 DIAGNOSIS — A419 Sepsis, unspecified organism: Principal | ICD-10-CM

## 2022-01-15 DIAGNOSIS — K7460 Unspecified cirrhosis of liver: Secondary | ICD-10-CM | POA: Diagnosis not present

## 2022-01-15 DIAGNOSIS — R109 Unspecified abdominal pain: Secondary | ICD-10-CM | POA: Diagnosis not present

## 2022-01-15 DIAGNOSIS — M1990 Unspecified osteoarthritis, unspecified site: Secondary | ICD-10-CM | POA: Diagnosis present

## 2022-01-15 DIAGNOSIS — Z886 Allergy status to analgesic agent status: Secondary | ICD-10-CM

## 2022-01-15 LAB — URINALYSIS, ROUTINE W REFLEX MICROSCOPIC
Glucose, UA: NEGATIVE mg/dL
Hgb urine dipstick: NEGATIVE
Ketones, ur: NEGATIVE mg/dL
Leukocytes,Ua: NEGATIVE
Nitrite: NEGATIVE
Protein, ur: NEGATIVE mg/dL
Specific Gravity, Urine: 1.01 (ref 1.005–1.030)
pH: 6 (ref 5.0–8.0)

## 2022-01-15 LAB — POC URINE PREG, ED: Preg Test, Ur: NEGATIVE

## 2022-01-15 LAB — CBC
HCT: 38.6 % (ref 36.0–46.0)
Hemoglobin: 13.7 g/dL (ref 12.0–15.0)
MCH: 34.9 pg — ABNORMAL HIGH (ref 26.0–34.0)
MCHC: 35.5 g/dL (ref 30.0–36.0)
MCV: 98.2 fL (ref 80.0–100.0)
Platelets: 84 K/uL — ABNORMAL LOW (ref 150–400)
RBC: 3.93 MIL/uL (ref 3.87–5.11)
RDW: 20.3 % — ABNORMAL HIGH (ref 11.5–15.5)
WBC: 6.9 K/uL (ref 4.0–10.5)
nRBC: 0 % (ref 0.0–0.2)

## 2022-01-15 LAB — COMPREHENSIVE METABOLIC PANEL WITH GFR
ALT: 61 U/L — ABNORMAL HIGH (ref 0–44)
AST: 173 U/L — ABNORMAL HIGH (ref 15–41)
Albumin: 2.5 g/dL — ABNORMAL LOW (ref 3.5–5.0)
Alkaline Phosphatase: 122 U/L (ref 38–126)
Anion gap: 16 — ABNORMAL HIGH (ref 5–15)
BUN: <5 mg/dL — ABNORMAL LOW (ref 6–20)
CO2: 25 mmol/L (ref 22–32)
Calcium: 7.4 mg/dL — ABNORMAL LOW (ref 8.9–10.3)
Chloride: 91 mmol/L — ABNORMAL LOW (ref 98–111)
Creatinine, Ser: 0.33 mg/dL — ABNORMAL LOW (ref 0.44–1.00)
GFR, Estimated: >60 mL/min
Glucose, Bld: 112 mg/dL — ABNORMAL HIGH (ref 70–99)
Potassium: 2.6 mmol/L — CL (ref 3.5–5.1)
Sodium: 132 mmol/L — ABNORMAL LOW (ref 135–145)
Total Bilirubin: 20.5 mg/dL (ref 0.3–1.2)
Total Protein: 7.2 g/dL (ref 6.5–8.1)

## 2022-01-15 LAB — TROPONIN I (HIGH SENSITIVITY)
Troponin I (High Sensitivity): 10 ng/L (ref <18)
Troponin I (High Sensitivity): 9 ng/L (ref <18)

## 2022-01-15 LAB — SARS CORONAVIRUS 2 BY RT PCR: SARS Coronavirus 2 by RT PCR: NEGATIVE

## 2022-01-15 LAB — LIPASE, BLOOD: Lipase: 32 U/L (ref 11–51)

## 2022-01-15 LAB — LACTIC ACID, PLASMA
Lactic Acid, Venous: 6.5 mmol/L (ref 0.5–1.9)
Lactic Acid, Venous: 5.1 mmol/L (ref 0.5–1.9)

## 2022-01-15 LAB — AMMONIA: Ammonia: 67 umol/L — ABNORMAL HIGH (ref 9–35)

## 2022-01-15 MED ORDER — PANTOPRAZOLE SODIUM 40 MG PO TBEC
40.0000 mg | DELAYED_RELEASE_TABLET | Freq: Every day | ORAL | Status: DC
  Administered 2022-01-16 – 2022-01-23 (×8): 40 mg via ORAL
  Filled 2022-01-15 (×8): qty 1

## 2022-01-15 MED ORDER — LACTULOSE 10 GM/15ML PO SOLN
20.0000 g | Freq: Once | ORAL | Status: AC
  Administered 2022-01-15: 20 g via ORAL
  Filled 2022-01-15: qty 30

## 2022-01-15 MED ORDER — TRAZODONE HCL 50 MG PO TABS
25.0000 mg | ORAL_TABLET | Freq: Every evening | ORAL | Status: DC | PRN
  Administered 2022-01-16 – 2022-01-19 (×5): 25 mg via ORAL
  Filled 2022-01-15 (×6): qty 1

## 2022-01-15 MED ORDER — BUPRENORPHINE 7.5 MCG/HR TD PTWK
1.0000 | MEDICATED_PATCH | TRANSDERMAL | Status: DC
  Administered 2022-01-16 – 2022-01-22 (×2): 1 via TRANSDERMAL
  Filled 2022-01-15 (×3): qty 1

## 2022-01-15 MED ORDER — SODIUM CHLORIDE 0.9 % IV SOLN
2.0000 g | Freq: Once | INTRAVENOUS | Status: AC
  Administered 2022-01-15: 2 g via INTRAVENOUS
  Filled 2022-01-15: qty 20

## 2022-01-15 MED ORDER — ADULT MULTIVITAMIN W/MINERALS CH
1.0000 | ORAL_TABLET | Freq: Every day | ORAL | Status: DC
  Administered 2022-01-16 – 2022-01-23 (×8): 1 via ORAL
  Filled 2022-01-15 (×8): qty 1

## 2022-01-15 MED ORDER — GABAPENTIN 300 MG PO CAPS
300.0000 mg | ORAL_CAPSULE | Freq: Two times a day (BID) | ORAL | Status: DC | PRN
  Administered 2022-01-17 – 2022-01-22 (×5): 300 mg via ORAL
  Filled 2022-01-15 (×6): qty 1

## 2022-01-15 MED ORDER — METOCLOPRAMIDE HCL 5 MG/ML IJ SOLN
10.0000 mg | Freq: Four times a day (QID) | INTRAMUSCULAR | Status: DC | PRN
  Administered 2022-01-15 – 2022-01-21 (×2): 10 mg via INTRAVENOUS
  Filled 2022-01-15 (×2): qty 2

## 2022-01-15 MED ORDER — MOMETASONE FURO-FORMOTEROL FUM 200-5 MCG/ACT IN AERO
2.0000 | INHALATION_SPRAY | Freq: Two times a day (BID) | RESPIRATORY_TRACT | Status: DC
  Administered 2022-01-16 – 2022-01-23 (×14): 2 via RESPIRATORY_TRACT
  Filled 2022-01-15: qty 8.8

## 2022-01-15 MED ORDER — HYDROCORTISONE (PERIANAL) 2.5 % EX CREA: 1.0000 | TOPICAL_CREAM | Freq: Two times a day (BID) | CUTANEOUS | Status: DC | PRN

## 2022-01-15 MED ORDER — LIDOCAINE 5 % EX PTCH
1.0000 | MEDICATED_PATCH | CUTANEOUS | Status: DC
  Administered 2022-01-15 – 2022-01-22 (×7): 1 via TRANSDERMAL
  Filled 2022-01-15 (×7): qty 1

## 2022-01-15 MED ORDER — METRONIDAZOLE 500 MG/100ML IV SOLN
500.0000 mg | Freq: Two times a day (BID) | INTRAVENOUS | Status: AC
  Administered 2022-01-15 – 2022-01-22 (×14): 500 mg via INTRAVENOUS
  Filled 2022-01-15 (×14): qty 100

## 2022-01-15 MED ORDER — IOHEXOL 300 MG/ML  SOLN
100.0000 mL | Freq: Once | INTRAMUSCULAR | Status: AC | PRN
  Administered 2022-01-15: 100 mL via INTRAVENOUS

## 2022-01-15 MED ORDER — SODIUM CHLORIDE 0.9 % IV SOLN
2.0000 g | INTRAVENOUS | Status: AC
  Administered 2022-01-16 – 2022-01-22 (×7): 2 g via INTRAVENOUS
  Filled 2022-01-15 (×3): qty 20
  Filled 2022-01-15: qty 2
  Filled 2022-01-15 (×2): qty 20
  Filled 2022-01-15: qty 2

## 2022-01-15 MED ORDER — MAGNESIUM HYDROXIDE 400 MG/5ML PO SUSP: 30.0000 mL | Freq: Every day | ORAL | Status: DC | PRN

## 2022-01-15 MED ORDER — HYDROCODONE-ACETAMINOPHEN 5-325 MG PO TABS
1.0000 | ORAL_TABLET | Freq: Four times a day (QID) | ORAL | Status: DC | PRN
  Administered 2022-01-16 – 2022-01-17 (×5): 1 via ORAL
  Filled 2022-01-15 (×5): qty 1

## 2022-01-15 MED ORDER — SODIUM CHLORIDE 0.9 % IV BOLUS
1000.0000 mL | Freq: Once | INTRAVENOUS | Status: AC
  Administered 2022-01-15: 1000 mL via INTRAVENOUS

## 2022-01-15 MED ORDER — ENOXAPARIN SODIUM 40 MG/0.4ML IJ SOSY
40.0000 mg | PREFILLED_SYRINGE | INTRAMUSCULAR | Status: DC
  Administered 2022-01-15: 40 mg via SUBCUTANEOUS
  Filled 2022-01-15: qty 0.4

## 2022-01-15 MED ORDER — HYDROMORPHONE HCL 1 MG/ML IJ SOLN
0.5000 mg | INTRAMUSCULAR | Status: DC | PRN
  Administered 2022-01-15 – 2022-01-17 (×11): 0.5 mg via INTRAVENOUS
  Filled 2022-01-15: qty 1
  Filled 2022-01-15: qty 0.5
  Filled 2022-01-15 (×2): qty 1
  Filled 2022-01-15: qty 0.5
  Filled 2022-01-15 (×6): qty 1

## 2022-01-15 NOTE — ED Notes (Signed)
Patient to CT at this time

## 2022-01-15 NOTE — ED Provider Notes (Signed)
[facility] Provider Note    Event Date/Time   First MD Initiated Contact with Patient 01/15/22 1810     (approximate)   History   Abdominal Pain   HPI  Rhonda Martin is a 46 y.o. female extensive past medical history including polysubstance abuse chronic pain syndrome, alcoholic cirrhosis persistently drinking presents to the ER for evaluation of worsening abdominal pain chills nausea feeling she is having worsening abdominal distention and dark-colored urine.  States that she has been drinking over the weekend.        Physical Exam   Triage Vital Signs: ED Triage Vitals  Enc Vitals Group     BP 01/15/22 1724 139/83     Pulse Rate 01/15/22 1724 (!) 120     Resp 01/15/22 1724 (!) 22     Temp 01/15/22 1724 98.8 F (37.1 C)     Temp Source 01/15/22 1724 Oral     SpO2 01/15/22 1724 94 %     Weight 01/15/22 1726 150 lb (68 kg)     Height 01/15/22 1726 '5\' 2"'$  (1.575 m)     Head Circumference --      Peak Flow --      Pain Score 01/15/22 1726 10     Pain Loc --      Pain Edu? --      Excl. in [place]? --     Most recent vital signs: Vitals:   01/15/22 2000 01/15/22 2030  BP: (!) 161/83 120/70  Pulse:  (!) 101  Resp: 17 16  Temp:    SpO2:  97%     Constitutional: Alert, chronically ill appearing.  jaundiced Eyes: Conjunctivae are normal.  Head: Atraumatic. Nose: No congestion/rhinnorhea. Mouth/Throat: Mucous membranes are moist.   Neck: Painless ROM.  Cardiovascular:   Good peripheral circulation.  Mildly tachycardic Respiratory: Normal respiratory effort.  No retractions.  Gastrointestinal: Soft , distended with tenderness to palpation in the left upper quadrant. Musculoskeletal: Status post right AKA Neurologic:  MAE spontaneously. No gross focal neurologic deficits are appreciated.  Skin:  Skin is warm, dry and intact. No rash noted. Psychiatric: Mood and affect are normal. Speech and behavior are normal.    ED Results / Procedures /  Treatments   Labs (all labs ordered are listed, but only abnormal results are displayed) Labs Reviewed  COMPREHENSIVE METABOLIC PANEL - Abnormal; Notable for the following components:      Result Value   Sodium 132 (*)    Potassium 2.6 (*)    Chloride 91 (*)    Glucose, Bld 112 (*)    BUN <5 (*)    Creatinine, Ser 0.33 (*)    Calcium 7.4 (*)    Albumin 2.5 (*)    AST 173 (*)    ALT 61 (*)    Total Bilirubin 20.5 (*)    Anion gap 16 (*)    All other components within normal limits  CBC - Abnormal; Notable for the following components:   MCH 34.9 (*)    RDW 20.3 (*)    Platelets 84 (*)    All other components within normal limits  URINALYSIS, ROUTINE W REFLEX MICROSCOPIC - Abnormal; Notable for the following components:   Color, Urine AMBER (*)    APPearance HAZY (*)    Bilirubin Urine MODERATE (*)    All other components within normal limits  AMMONIA - Abnormal; Notable for the following components:   Ammonia 67 (*)    All  other components within normal limits  LACTIC ACID, PLASMA - Abnormal; Notable for the following components:   Lactic Acid, Venous 6.5 (*)    All other components within normal limits  LACTIC ACID, PLASMA - Abnormal; Notable for the following components:   Lactic Acid, Venous 5.1 (*)    All other components within normal limits  CULTURE, BLOOD (ROUTINE X 2)  CULTURE, BLOOD (ROUTINE X 2)  SARS CORONAVIRUS 2 BY RT PCR  LIPASE, BLOOD  PROTIME-INR  CORTISOL-AM, BLOOD  PROCALCITONIN  COMPREHENSIVE METABOLIC PANEL  POC URINE PREG, ED  TROPONIN I (HIGH SENSITIVITY)  TROPONIN I (HIGH SENSITIVITY)     EKG  ED ECG REPORT I, Merlyn Lot, the attending physician, personally viewed and interpreted this ECG.   Date: 01/15/2022  EKG Time: 19:51  Rate: 90  Rhythm: sinus  Axis: normal  Intervals: normal  ST&T Change: no stemi    RADIOLOGY Please see ED Course for my review and interpretation.  I personally reviewed all radiographic images  ordered to evaluate for the above acute complaints and reviewed radiology reports and findings.  These findings were personally discussed with the patient.  Please see medical record for radiology report.    PROCEDURES:  Critical Care performed: Yes, see critical care procedure note(s)  .Critical Care  Performed by: Merlyn Lot, MD Authorized by: Merlyn Lot, MD   Critical care provider statement:    Critical care time (minutes):  35   Critical care was necessary to treat or prevent imminent or life-threatening deterioration of the following conditions:  Sepsis   Critical care was time spent personally by me on the following activities:  Ordering and performing treatments and interventions, ordering and review of laboratory studies, ordering and review of radiographic studies, pulse oximetry, re-evaluation of patient's condition, review of old charts, obtaining history from patient or surrogate, examination of patient, evaluation of patient's response to treatment, discussions with primary provider, discussions with consultants and development of treatment plan with patient or surrogate    MEDICATIONS ORDERED IN ED: Medications  HYDROmorphone (DILAUDID) injection 0.5 mg (0.5 mg Intravenous Given 01/15/22 1957)  buprenorphine (BUTRANS) 7.5 MCG/HR 1 patch (has no administration in time range)  HYDROcodone-acetaminophen (NORCO/VICODIN) 5-325 MG per tablet 1 tablet (has no administration in time range)  pantoprazole (PROTONIX) EC tablet 40 mg (has no administration in time range)  gabapentin (NEURONTIN) capsule 300 mg (has no administration in time range)  multivitamin with minerals tablet 1 tablet (has no administration in time range)  mometasone-formoterol (DULERA) 200-5 MCG/ACT inhaler 2 puff (has no administration in time range)  hydrocortisone (ANUSOL-HC) 2.5 % rectal cream 1 Application (has no administration in time range)  lidocaine (LIDODERM) 5 % 1 patch (has no  administration in time range)  enoxaparin (LOVENOX) injection 40 mg (has no administration in time range)  cefTRIAXone (ROCEPHIN) 2 g in sodium chloride 0.9 % 100 mL IVPB (has no administration in time range)  metroNIDAZOLE (FLAGYL) IVPB 500 mg (has no administration in time range)  traZODone (DESYREL) tablet 25 mg (has no administration in time range)  magnesium hydroxide (MILK OF MAGNESIA) suspension 30 mL (has no administration in time range)  metoCLOPramide (REGLAN) injection 10 mg (has no administration in time range)  cefTRIAXone (ROCEPHIN) 2 g in sodium chloride 0.9 % 100 mL IVPB (0 g Intravenous Stopped 01/15/22 1952)  iohexol (OMNIPAQUE) 300 MG/ML solution 100 mL (100 mLs Intravenous Contrast Given 01/15/22 1837)  sodium chloride 0.9 % bolus 1,000 mL (0 mLs Intravenous Stopped 01/15/22  1952)  lactulose (CHRONULAC) 10 GM/15ML solution 20 g (20 g Oral Given 01/15/22 1956)     IMPRESSION / MDM / ASSESSMENT AND PLAN / ED COURSE  I reviewed the triage vital signs and the nursing notes.                              Differential diagnosis includes, but is not limited to, neuritis, gastritis, perforation, colitis, appendicitis, sepsis, biliary pathology, decompensated liver failure, alcohol abuse, positive for  This patient presented to the ER for evaluation of symptoms as described above.  This presenting complaint could reflect a potentially life-threatening illness therefore the patient will be placed on continuous pulse oximetry and telemetry for monitoring.  Laboratory evaluation will be sent to evaluate for the above complaints.  Patient uncomfortable appearing ill-appearing jaundiced.  Will provide IV fluids.  Lactate is significantly elevated 6 complaint of suspected underlying sepsis possible SBP but also in the setting of decompensated liver failure may also be elevated secondary to that.  CT imaging will be ordered.    Clinical Course as of 01/15/22 2132  Thu Jan 15, 2022  1843 CT  Imaging on my review and interpretation shows evidence of cirrhotic liver and mild ascites. [PR]    Clinical Course User Index [PR] Merlyn Lot, MD   CT imaging without acute abnormality.  Patient with ascites but no easily approachable location for bedside diagnostic tap at this time.  Will continue with IV fluids as well as IV antibiotics.  Will admit patient to hospitalist for further medical treatment and workup  FINAL CLINICAL IMPRESSION(S) / ED DIAGNOSES   Final diagnoses:  Sepsis, due to unspecified organism, unspecified whether acute organ dysfunction present [facility])  Alcoholic cirrhosis of liver with ascites ([place])     Rx / DC Orders   ED Discharge Orders     None        Note:  This document was prepared using Dragon voice recognition software and may include unintentional dictation errors.    Merlyn Lot, MD 01/15/22 2132

## 2022-01-15 NOTE — ED Notes (Signed)
IV attempt x 2. Another RN at bedside to attempt

## 2022-01-15 NOTE — ED Notes (Signed)
Request made for transport to the floor ?

## 2022-01-15 NOTE — ED Notes (Signed)
Poct pregnancy Negative 
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
## 2022-01-15 NOTE — ED Provider Triage Note (Signed)
Emergency Medicine Provider Triage Evaluation Note  Rhonda Martin , a 46 y.o. female  was evaluated in triage.  Pt complains of 3 weeks of severe abdominal pain, distention.  No fevers nausea or vomiting.  Patient jaundiced.  Patient also reports decreased bowel movements  Review of Systems  Positive: Jaundice, abdominal pain diffuse Negative: Chest pain, shortness of breath fevers  Physical Exam  BP 139/83 (BP Location: Left Arm)   Pulse (!) 120   Temp 98.8 F (37.1 C) (Oral)   Resp (!) 22   Ht '5\' 2"'$  (1.575 m)   Wt 68 kg   LMP 01/14/2022 (Approximate)   SpO2 94%   BMI 27.44 kg/m  Gen:   Awake, no distress   Resp:  Normal effort  MSK:   Moves extremities without difficulty  Other:  Abdomen rigid, distended  Medical Decision Making  Medically screening exam initiated at 5:48 PM.  Appropriate orders placed.  Rhonda Martin was informed that the remainder of the evaluation will be completed by another provider, this initial triage assessment does not replace that evaluation, and the importance of remaining in the ED until their evaluation is complete.     Rhonda Guess, PA-C 01/15/22 1749

## 2022-01-15 NOTE — Telephone Encounter (Signed)
Per Dr Tasia Catchings "I think she should go to ER, symptoms sound like decompensated liver cirrhosis" Call returned to patient phone, her husband Marden Noble answered and said she was sleeping, I gave him the message that she needs to go to ER, He said he will go wake her up and take her to ER and thanked me for calling back

## 2022-01-15 NOTE — Telephone Encounter (Signed)
Patient and her husband called stating that patient needs to be seen and evaluated because she has been sick for 3 weeks. He had me speak with her regarding her symptoms, she reports that she is having abdominal pain and swelling to where she looks 9 months pregnant that comes and goes, she is unable to eat, she has been vomiting and has nausea. Her bowels are only moving small amounts, last bowel movement this morning. She states she may be impacted. She states she needs someone to figure out what is wrong with her and would like to come in to see Dr Tasia Catchings ASAP. Please advise.

## 2022-01-15 NOTE — ED Triage Notes (Signed)
Pt to triage via wheelchair.  Pt has abd pain with swelling  pt states her pmd sent her to er for eval of abd pain.   Pt has n/v.   Hx liver problems.  Pt alert

## 2022-01-16 ENCOUNTER — Inpatient Hospital Stay: Payer: Medicaid Other

## 2022-01-16 DIAGNOSIS — J449 Chronic obstructive pulmonary disease, unspecified: Secondary | ICD-10-CM | POA: Diagnosis present

## 2022-01-16 DIAGNOSIS — E876 Hypokalemia: Secondary | ICD-10-CM

## 2022-01-16 DIAGNOSIS — K7031 Alcoholic cirrhosis of liver with ascites: Secondary | ICD-10-CM | POA: Diagnosis not present

## 2022-01-16 DIAGNOSIS — A419 Sepsis, unspecified organism: Secondary | ICD-10-CM

## 2022-01-16 DIAGNOSIS — K652 Spontaneous bacterial peritonitis: Secondary | ICD-10-CM | POA: Insufficient documentation

## 2022-01-16 DIAGNOSIS — G8929 Other chronic pain: Secondary | ICD-10-CM | POA: Diagnosis present

## 2022-01-16 DIAGNOSIS — K219 Gastro-esophageal reflux disease without esophagitis: Secondary | ICD-10-CM | POA: Diagnosis present

## 2022-01-16 DIAGNOSIS — E7220 Disorder of urea cycle metabolism, unspecified: Secondary | ICD-10-CM

## 2022-01-16 LAB — COMPREHENSIVE METABOLIC PANEL
ALT: 48 U/L — ABNORMAL HIGH (ref 0–44)
AST: 140 U/L — ABNORMAL HIGH (ref 15–41)
Albumin: 2.2 g/dL — ABNORMAL LOW (ref 3.5–5.0)
Alkaline Phosphatase: 103 U/L (ref 38–126)
Anion gap: 7 (ref 5–15)
BUN: <5 mg/dL — ABNORMAL LOW (ref 6–20)
CO2: 29 mmol/L (ref 22–32)
Calcium: 6.7 mg/dL — ABNORMAL LOW (ref 8.9–10.3)
Chloride: 97 mmol/L — ABNORMAL LOW (ref 98–111)
Creatinine, Ser: 0.33 mg/dL — ABNORMAL LOW (ref 0.44–1.00)
GFR, Estimated: >60 mL/min (ref >60)
Glucose, Bld: 115 mg/dL — ABNORMAL HIGH (ref 70–99)
Potassium: 3.5 mmol/L (ref 3.5–5.1)
Sodium: 133 mmol/L — ABNORMAL LOW (ref 135–145)
Total Bilirubin: 18.2 mg/dL (ref 0.3–1.2)
Total Protein: 6.2 g/dL — ABNORMAL LOW (ref 6.5–8.1)

## 2022-01-16 LAB — SALICYLATE LEVEL: Salicylate Lvl: <7 mg/dL — ABNORMAL LOW (ref 7.0–30.0)

## 2022-01-16 LAB — BILIRUBIN, DIRECT: Bilirubin, Direct: 9.6 mg/dL — ABNORMAL HIGH (ref 0.0–0.2)

## 2022-01-16 LAB — CORTISOL-AM, BLOOD: Cortisol - AM: 8.5 ug/dL (ref 6.7–22.6)

## 2022-01-16 LAB — MAGNESIUM: Magnesium: 1.4 mg/dL — ABNORMAL LOW (ref 1.7–2.4)

## 2022-01-16 LAB — PROTIME-INR
INR: 1.8 — ABNORMAL HIGH (ref 0.8–1.2)
Prothrombin Time: 20.4 seconds — ABNORMAL HIGH (ref 11.4–15.2)

## 2022-01-16 LAB — PROCALCITONIN: Procalcitonin: 0.31 ng/mL

## 2022-01-16 LAB — AMMONIA: Ammonia: 82 umol/L — ABNORMAL HIGH (ref 9–35)

## 2022-01-16 LAB — ACETAMINOPHEN LEVEL: Acetaminophen (Tylenol), Serum: 15 ug/mL (ref 10–30)

## 2022-01-16 MED ORDER — DEXTROSE 5 % IV SOLN
15.0000 mg/kg/h | INTRAVENOUS | Status: AC
  Administered 2022-01-16: 15 mg/kg/h via INTRAVENOUS
  Filled 2022-01-16 (×2): qty 90

## 2022-01-16 MED ORDER — ACETYLCYSTEINE LOAD VIA INFUSION
150.0000 mg/kg | Freq: Once | INTRAVENOUS | Status: AC
Start: 2022-01-16 — End: 2022-01-16
  Administered 2022-01-16: 10200 mg via INTRAVENOUS
  Filled 2022-01-16: qty 335

## 2022-01-16 MED ORDER — POTASSIUM CHLORIDE 20 MEQ PO PACK
40.0000 meq | PACK | Freq: Once | ORAL | Status: AC
  Administered 2022-01-16: 40 meq via ORAL
  Filled 2022-01-16: qty 2

## 2022-01-16 MED ORDER — ALPRAZOLAM 0.25 MG PO TABS
0.2500 mg | ORAL_TABLET | Freq: Two times a day (BID) | ORAL | Status: DC | PRN
Start: 2022-01-16 — End: 2022-01-23
  Administered 2022-01-16 – 2022-01-23 (×13): 0.25 mg via ORAL
  Filled 2022-01-16 (×14): qty 1

## 2022-01-16 MED ORDER — GADOBUTROL 1 MMOL/ML IV SOLN
7.0000 mL | Freq: Once | INTRAVENOUS | Status: AC | PRN
  Administered 2022-01-16: 7.5 mL via INTRAVENOUS

## 2022-01-16 MED ORDER — CALCIUM GLUCONATE-NACL 1-0.675 GM/50ML-% IV SOLN
1.0000 g | Freq: Once | INTRAVENOUS | Status: AC
  Administered 2022-01-16: 1000 mg via INTRAVENOUS
  Filled 2022-01-16: qty 50

## 2022-01-16 MED ORDER — IPRATROPIUM-ALBUTEROL 0.5-2.5 (3) MG/3ML IN SOLN: 3.0000 mL | Freq: Four times a day (QID) | RESPIRATORY_TRACT | Status: DC | PRN

## 2022-01-16 MED ORDER — LACTULOSE 10 GM/15ML PO SOLN
30.0000 g | Freq: Two times a day (BID) | ORAL | Status: DC
Start: 2022-01-16 — End: 2022-01-19
  Administered 2022-01-16 – 2022-01-19 (×7): 30 g via ORAL
  Filled 2022-01-16 (×8): qty 60

## 2022-01-16 MED ORDER — LOPERAMIDE HCL 2 MG PO CAPS
2.0000 mg | ORAL_CAPSULE | ORAL | Status: DC | PRN
Start: 2022-01-16 — End: 2022-01-19

## 2022-01-16 MED ORDER — MAGNESIUM SULFATE 4 GM/100ML IV SOLN
4.0000 g | Freq: Once | INTRAVENOUS | Status: AC
  Administered 2022-01-16: 4 g via INTRAVENOUS
  Filled 2022-01-16: qty 100

## 2022-01-16 NOTE — Assessment & Plan Note (Addendum)
Replaced accordingly

## 2022-01-16 NOTE — Assessment & Plan Note (Signed)
-   We will continue her inhalers. 

## 2022-01-16 NOTE — Progress Notes (Signed)
Patient with history of [place] cirrhosis who presented to the ED yesterday with abdominal pain, distention, n/v, diarrhea. Due to concern for SBP request made to IR for diagnostic paracentesis.  Limited abdominal US of all 4 quadrants shows no ascites which is amenable to paracentesis at this time.   Images reviewed with patient who states understanding.  No procedure performed, patient returned to floor.   Images available for review under imaging section of Epic. Please call with questions or concerns.  Candiss Norse, PA-C
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
## 2022-01-16 NOTE — TOC Initial Note (Signed)
Transition of Care [facility]) - Initial/Assessment Note    Patient Details  Name: Rhonda Martin MRN: [id number] Date of Birth: 1975-12-28  Transition of Care [facility]) CM/SW Contact:    Conception Oms, RN Phone Number: 01/16/2022, 9:49 AM  Clinical Narrative:                   Transition of Care [facility]) Screening Note   Patient Details  Name: Rhonda Martin Date of Birth: 1976-03-21   Transition of [facility]) CM/SW Contact:    Conception Oms, RN Phone Number: 01/16/2022, 9:49 AM  Patient is independent at home has Medicaid and PCP,   Transition of Care Department [facility]) has reviewed patient and no TOC needs have been identified at this time. We will continue to monitor patient advancement through interdisciplinary progression rounds. If new patient transition needs arise, please place a TOC consult.         Patient Goals and CMS Choice        Expected Discharge Plan and Services                                                Prior Living Arrangements/Services                       Activities of Daily Living Home Assistive Devices/Equipment: Wheelchair ADL Screening (condition at time of admission) Patient's cognitive ability adequate to safely complete daily activities?: Yes Is the patient deaf or have difficulty hearing?: No Does the patient have difficulty seeing, even when wearing glasses/contacts?: No Does the patient have difficulty concentrating, remembering, or making decisions?: Yes Patient able to express need for assistance with ADLs?: Yes Does the patient have difficulty dressing or bathing?: Yes Independently performs ADLs?: No Communication: Independent Dressing (OT): Independent Grooming: Independent Feeding: Independent Bathing: Needs assistance Is this a change from baseline?: Pre-admission baseline Toileting: Independent In/Out Bed: Needs assistance Is this a change from baseline?: Pre-admission baseline Walks in Home:  Needs assistance Is this a change from baseline?: Pre-admission baseline Does the patient have difficulty walking or climbing stairs?: Yes Weakness of Legs: Right Weakness of Arms/Hands: None  Permission Sought/Granted                  Emotional Assessment              Admission diagnosis:  Alcoholic cirrhosis of liver with ascites ([place]) [K70.31] Sepsis ([place]) [A41.9] Sepsis, due to unspecified organism, unspecified whether acute organ dysfunction present [facility]) [A41.9] Patient Active Problem List   Diagnosis Date Noted   Spontaneous bacterial peritonitis ([place]) 01/16/2022   GERD without esophagitis 01/16/2022   Chronic pain 01/16/2022   Hyperammonemia ([place]) 01/16/2022   Chronic obstructive pulmonary disease (COPD) ([place]) 01/16/2022   Sepsis ([place]) 01/15/2022   Ventricular tachycardia ([place]) 10/02/2021   Abdominal pain 10/01/2021   Bloody stools 07/30/2021   Closed fracture of T11 vertebra with routine healing 07/30/2021   Recurrent falls 07/30/2021   Chest pain, non-cardiac 07/14/2021   Status post above-knee amputation of right lower extremity ([place]) 78/29/5621   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)    Secondary esophageal varices without bleeding ([place])    Polyp of colon    Pain 03/26/2021   BRBPR (  bright red blood per rectum) 03/25/2021   Substance use 10/08/2020   Chronic pain syndrome 09/09/2020   Prolonged Q-T interval on ECG 09/09/2020   Tobacco use disorder, moderate, dependence 09/05/2020   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[place]) 05/13/2020   Polysubstance abuse ([place]) 03/19/2020   Chest pain 02/22/2020   Osteomyelitis of right lower extremity ([place]) 02/14/2020   Infection of deep incisional surgical site after procedure    S/P TKR (total knee replacement) using cement, 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   Pancytopenia ([place])    Hypomagnesemia    AKI (acute  kidney injury) ([place])    Elevated LFTs 10/24/2019   Hyperbilirubinemia 10/24/2019   Neutropenic fever (HCC)    Hypotension    Hallucinations 08/22/2019   Hyponatremia 07/10/2019   Hypokalemia 07/10/2019   Anxiety 07/10/2019   COPD with acute exacerbation ([place]) 07/10/2019   HTN (hypertension) 07/10/2019   Esophageal varices in cirrhosis (HCC)    Symptomatic anemia 06/26/2019   Insomnia 04/03/2019   Sinus tachycardia 04/03/2019   Knee pain, right 03/31/2019   Chronic pain of right knee 03/31/2019   Bilateral leg edema 02/06/2019   Generalized anxiety disorder 01/23/2019   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   Chronic hepatitis C without hepatic coma ([place]) 12/20/2018   Thrombocytopenia ([place]) 12/20/2018   Alcohol abuse 12/20/2018   Transaminitis 12/20/2018   Tobacco abuse 12/20/2018   Easy bruising 12/20/2018   Weakness 12/20/2018   Charcot-Marie-Tooth disease    Painful orthopaedic hardware ([place]) 05/01/2016   Wound infection complicating hardware ([place]) 03/20/2016   Multiple fractures 05/07/2014   Closed burst fracture of lumbar vertebra ([place]) 04/28/2014   Closed fracture of multiple ribs of right side 04/28/2014   Mild tetrahydrocannabinol (THC) abuse 04/28/2014   Open fracture of right tibia and fibula 04/28/2014   Sternal fracture 04/28/2014   Cannabis abuse 04/28/2014   OA (osteoarthritis) of knee 08/28/2013   Abnormal gait 05/10/2013   Disorder of nervous system 10/20/2012   Family history of diabetes mellitus 11/21/2008   Family history of stroke 11/21/2008   PCP:  Venita Lick, NP Pharmacy:   [id number] [place] [address] [id number]: [phone]: [phone]    Social Determinants of Health (SDOH) Interventions    Readmission Risk Interventions    02/19/2020    3:54 PM 11/29/2019    2:37 PM 10/25/2019   12:55 PM  Readmission Risk Prevention Plan  Transportation Screening Complete Complete Complete   PCP or Specialist Appt within 3-5 Days   Complete  HRI or Home Care Consult   Complete  Medication Review ([place] Complete Complete Complete  PCP or Specialist appointment within 3-5 days of discharge Complete Complete   HRI or Home Care Consult Complete    SW Recovery Care/Counseling Consult Complete    Palliative Care Screening Not Applicable Not Applicable   Skilled Nursing Facility Complete Not Applicable

## 2022-01-16 NOTE — Progress Notes (Signed)
PROGRESS NOTE    Rhonda Martin  DYJ:[id number] DOB: 1976-03-15 DOA: 01/15/2022 PCP: Venita Lick, NP    Assessment & Plan:   Active Problems:   Sepsis ([place])   Spontaneous bacterial peritonitis (HCC)   Hyperammonemia (HCC)   Hypokalemia   GERD without esophagitis   Chronic pain   Chronic obstructive pulmonary disease (COPD) (HCC)  Assessment and Plan: Severe sepsis: met criteria w/ tachycardia, tachypnea, elevated lactic acid & possible SBP. Continue on IV rocephin, flagyl.    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 MELD score 27. Last drink of alcohol was 01/15/22. Hx of HBV/HCV. GI consulted  Possible spontaneous bacterial peritonitis: IR consulted for paracentesis but not enough ascites present as per IR so unable to do. Continue on IV rocephin, flagyl. Hx of alcoholic cirrhosis w/ ascites.   Hyperammonemia: continue on lactulose   Hyperbilirubinemia: likely secondary to alcoholic cirrhosis. GI consulted   Possible liver mass: as per CT. Will need MRI abd/pelvis outpatient to further evaluate   Thrombocytopenia: likely secondary to alcohol abuse & cirrhosis. Will continue to monitor   Hypomagnesemia: mg sulfate given  Hypocalcemia: ca gluconate given. Corrected Ca 8.1.   Hx of right AKA: continue w/ supportive care    GERD: continue on PPI    Hypokalemia: WNL today    Chronic pain: continue on home buprenorphine patch    COPD exacerbation: continue bronchodilators         DVT prophylaxis: SCDs Code Status: full  Family Communication:  Disposition Plan: likely d/c back home   Level of care: Telemetry Medical  Status is: Inpatient Remains inpatient appropriate because: severity of illness   Consultants:  GI   Procedures:   Antimicrobials: rocephin, flagyl   Subjective: Pt c/o abd pain   Objective: Vitals:   01/15/22 2330 01/16/22 0022 01/16/22 0537 01/16/22 0738  BP: 116/77 127/83 (!) 104/47 130/74  Pulse: (!) 113 (!) 110 (!) 110  (!) 107  Resp: 11 15 18 16   Temp:  99.2 F (37.3 C) 99.8 F (37.7 C) 98.6 F (37 C)  TempSrc:      SpO2: 94% 98% 96% 96%  Weight:      Height:        Intake/Output Summary (Last 24 hours) at 01/16/2022 0849 Last data filed at 01/16/2022 0100 Gross per 24 hour  Intake 100 ml  Output 500 ml  Net -400 ml   Filed Weights   01/15/22 1726  Weight: 68 kg    Examination:  General exam: Appears uncomfortable. Jaundice  Respiratory system: Clear to auscultation. Respiratory effort normal. Cardiovascular system: S1 & S2+. No rubs, gallops or clicks.  Gastrointestinal system: Abdomen is nondistended, soft and tenderness to palpation, normal bowel sounds  Central nervous system: Alert and oriented. Moves extremities  Psychiatry: Judgement and insight appears poor. Flat mood and affect     Data Reviewed: I have personally reviewed following labs and imaging studies  CBC: Recent Labs  Lab 01/15/22 1734  WBC 6.9  HGB 13.7  HCT 38.6  MCV 98.2  PLT 84*   Basic Metabolic Panel: Recent Labs  Lab 01/15/22 1734 01/16/22 0521  NA 132* 133*  K 2.6* 3.5  CL 91* 97*  CO2 25 29  GLUCOSE 112* 115*  BUN <5* <5*  CREATININE 0.33* 0.33*  CALCIUM 7.4* 6.7*  MG  --  1.4*   GFR: Estimated Creatinine Clearance: 79.5 mL/min (A) (by C-G formula based on SCr of 0.33 mg/dL (L)). Liver Function Tests: Recent Labs  Lab 01/15/22 1734 01/16/22 0521  AST 173* 140*  ALT 61* 48*  ALKPHOS 122 103  BILITOT 20.5* 18.2*  PROT 7.2 6.2*  ALBUMIN 2.5* 2.2*   Recent Labs  Lab 01/15/22 1734  LIPASE 32   Recent Labs  Lab 01/15/22 1734 01/16/22 0521  AMMONIA 67* 82*   Coagulation Profile: Recent Labs  Lab 01/16/22 0521  INR 1.8*   Cardiac Enzymes: No results for input(s): "CKTOTAL", "CKMB", "CKMBINDEX", "TROPONINI" in the last 168 hours. BNP (last 3 results) No results for input(s): "PROBNP" in the last 8760 hours. HbA1C: No results for input(s): "HGBA1C" in the last 72  hours. CBG: No results for input(s): "GLUCAP" in the last 168 hours. Lipid Profile: No results for input(s): "CHOL", "HDL", "LDLCALC", "TRIG", "CHOLHDL", "LDLDIRECT" in the last 72 hours. Thyroid Function Tests: No results for input(s): "TSH", "T4TOTAL", "FREET4", "T3FREE", "THYROIDAB" in the last 72 hours. Anemia Panel: No results for input(s): "VITAMINB12", "FOLATE", "FERRITIN", "TIBC", "IRON", "RETICCTPCT" in the last 72 hours. Sepsis Labs: Recent Labs  Lab 01/15/22 1734 01/15/22 2000 01/16/22 0521  PROCALCITON  --   --  0.31  LATICACIDVEN 6.5* 5.1*  --     Recent Results (from the past 240 hour(s))  Blood culture (routine x 2)     Status: None (Preliminary result)   Collection Time: 01/15/22  6:36 PM   Specimen: BLOOD LEFT ARM  Result Value Ref Range Status   Specimen Description BLOOD LEFT ARM  Final   Special Requests   Final    BOTTLES DRAWN AEROBIC AND ANAEROBIC Blood Culture adequate volume   Culture   Final    NO GROWTH < 12 HOURS Performed at [facility], [address], [place], [place] [address]    Report Status PENDING  Incomplete  Blood culture (routine x 2)     Status: None (Preliminary result)   Collection Time: 01/15/22  6:36 PM   Specimen: Left Antecubital; Blood  Result Value Ref Range Status   Specimen Description LEFT ANTECUBITAL  Final   Special Requests   Final    BOTTLES DRAWN AEROBIC AND ANAEROBIC Blood Culture results may not be optimal due to an excessive volume of blood received in culture bottles   Culture   Final    NO GROWTH < 12 HOURS Performed at [facility], [address], [place], [place] [address]    Report Status PENDING  Incomplete  SARS Coronavirus 2 by RT PCR (hospital order, performed in [place] hospital lab) *cepheid single result test* Anterior Nasal Swab     Status: None   Collection Time: 01/15/22  8:00 PM   Specimen: Anterior Nasal Swab  Result Value Ref Range Status   SARS Coronavirus 2 by RT  PCR NEGATIVE NEGATIVE Final    Comment: Performed at [facility], [address] [place], [place] [address]         Radiology Studies: DG Chest Portable 1 View  Result Date: 01/15/2022 CLINICAL DATA:  Sepsis.  Jaundice.  Severe abdominal pain. EXAM: PORTABLE CHEST 1 VIEW COMPARISON:  One-view chest x-ray 10/01/2021 FINDINGS: Heart size is normal. Increased diffuse interstitial pattern is consistent with edema. No focal airspace consolidation is present. Visualized soft tissues and bony thorax are unremarkable. IMPRESSION: Increased diffuse interstitial pattern consistent with edema. Electronically Signed   By: San Morelle M.D.   On: 01/15/2022 19:41   CT ABDOMEN PELVIS W CONTRAST  Result Date: 01/15/2022 CLINICAL DATA:  Abdominal pain, acute, nonlocalized abd pain  swelling. Pt to triage via wheelchair. Pt has abd pain with swelling pt states her pmd sent her to er for eval of abd pain. Pt has n/v. Hx liver problems. EXAM: CT ABDOMEN AND PELVIS WITH CONTRAST TECHNIQUE: Multidetector CT imaging of the abdomen and pelvis was performed using the standard protocol following bolus administration of intravenous contrast. RADIATION DOSE REDUCTION: This exam was performed according to the departmental dose-optimization program which includes automated exposure control, adjustment of the mA and/or kV according to patient size and/or use of iterative reconstruction technique. CONTRAST:  143m OMNIPAQUE IOHEXOL 300 MG/ML  SOLN COMPARISON:  Ultrasound abdomen 07/14/2021 FINDINGS: Lower chest: No acute abnormality. Hepatobiliary: Nodular hepatic contour. Markedly heterogeneous hepatic parenchyma with query underlying mass lesions. Calcified gallstone within the gallbladder lumen. No gallbladder wall thickening or pericholecystic fluid. No biliary dilatation. Pancreas: No focal lesion. Normal pancreatic contour. No surrounding inflammatory changes. No main pancreatic ductal dilatation. Spleen:  Normal in size without focal abnormality. Adrenals/Urinary Tract: No adrenal nodule bilaterally. Bilateral kidneys enhance symmetrically. No hydronephrosis. No hydroureter. The urinary bladder is unremarkable. Stomach/Bowel: Stomach is within normal limits. No evidence of bowel wall thickening or dilatation. Appendix appears normal. Vascular/Lymphatic: Recanalized paraumbilical vein. Perigastric venous collaterals. The portal, splenic, superior mesenteric veins are patent. No abdominal aorta or iliac aneurysm. Mild to moderate atherosclerotic plaque of the aorta and its branches. No abdominal, pelvic, or inguinal lymphadenopathy. Reproductive: Uterus and bilateral adnexa are unremarkable. Other: Small volume simple free fluid. No intraperitoneal free gas. No organized fluid collection. Musculoskeletal: Tiny fat containing umbilical hernia. No suspicious lytic or blastic osseous lesions. No acute displaced fracture. Multilevel degenerative changes of the spine. Chronic appearing anterior wedge compression fracture of the T10 and L1 vertebral bodies. Superior endplate Schmorl node with concavity at the L3 level. IMPRESSION: 1. Cirrhosis with portal hypertension. Diffusely heterogeneous hepatic parenchyma with query underlying mass lesions concerning for malignancy. When the patient is clinically stable and able to follow directions and hold their breath (preferably as an outpatient) further evaluation with dedicated abdominal MRI liver protocol should be considered. 2. Small volume simple free fluid ascites. 3. Cholelithiasis with no CT finding of acute cholecystitis. 4.  Aortic Atherosclerosis (ICD10-I70.0). Electronically Signed   By: MIven FinnM.D.   On: 01/15/2022 18:56        Scheduled Meds:  buprenorphine  1 patch Transdermal Weekly   lactulose  30 g Oral BID   lidocaine  1 patch Transdermal Q24H   mometasone-formoterol  2 puff Inhalation BID   multivitamin with minerals  1 tablet Oral Daily    pantoprazole  40 mg Oral Daily   Continuous Infusions:  calcium gluconate     cefTRIAXone (ROCEPHIN)  IV     magnesium sulfate bolus IVPB     metronidazole Stopped (01/15/22 2344)     LOS: 1 day    Time spent: 38 mins     JWyvonnia Dusky MD [facility] Pager [phone]  If 7PM-7AM, please contact night-coverage [email or web address] 01/16/2022, 8:49 AM

## 2022-01-16 NOTE — Assessment & Plan Note (Addendum)
-   This is in the setting of alcoholic liver cirrhosis and associated ascites. - The patient will be placed on IV Rocephin and Flagyl. - Pain management will be provided. - IR paracentesis is ordered as mentioned above.

## 2022-01-16 NOTE — Assessment & Plan Note (Signed)
-   We will continue PPI therapy 

## 2022-01-16 NOTE — Assessment & Plan Note (Addendum)
Secondary to cirrhosis.  Lactulose changed to MiraLAX as per GI.

## 2022-01-16 NOTE — Progress Notes (Addendum)
[place] for Acetadote Indication: Concern for Acetaminophen Overdose  Allergies  Allergen Reactions   Tylenol [Acetaminophen] Other (See Comments)    Liver disease    Patient Measurements: Height: '5\' 2"'$  (157.5 cm) Weight: 68 kg (150 lb) IBW/kg (Calculated) : 50.1  Vital Signs: Temp: 98.9 F (37.2 C) (08/11 1123) Temp Source: Oral (08/11 1123) BP: 119/56 (08/11 1123) Pulse Rate: 104 (08/11 1123)   Labs: Recent Labs    01/15/22 1734 01/16/22 0521 01/16/22 1032  WBC 6.9  --   --   HGB 13.7  --   --   HCT 38.6  --   --   PLT 84*  --   --   CREATININE 0.33* 0.33*  --   MG  --  1.4*  --   ALBUMIN 2.5* 2.2*  --   PROT 7.2 6.2*  --   AST 173* 140*  --   ALT 61* 48*  --   ALKPHOS 122 103  --   BILITOT 20.5* 18.2*  --   BILIDIR  --   --  9.6*   Estimated Creatinine Clearance: 79.5 mL/min (A) (by C-G formula based on SCr of 0.33 mg/dL (L)).   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   Past Month   gabapentin (NEURONTIN) 300 MG capsule Take 300 mg by mouth 2 (two) times daily as needed.   01/15/2022   HYDROcodone-acetaminophen (NORCO/VICODIN) 5-325 MG tablet Take 1 tablet by mouth 4 (four) times daily as needed.   Past Month   mometasone-formoterol (DULERA) 200-5 MCG/ACT AERO Inhale 2 puffs into the lungs 2 (two) times daily. 13 g 0 01/15/2022   Multiple Vitamin (MULTIVITAMIN WITH MINERALS) TABS tablet Take 1 tablet by mouth daily. 90 tablet 1 01/15/2022   omeprazole (PRILOSEC) 40 MG capsule Take 1 capsule (40 mg total) by mouth 2 (two) times daily before a meal. 60 capsule 3 01/15/2022   hydrocortisone (ANUSOL-HC) 2.5 % rectal cream Place 1 application. rectally 2 (two) times daily. 30 g 0 prn   lidocaine (LIDODERM) 5 % Place 1 patch onto the skin daily. Remove & Discard patch within 12 hours or as directed by MD 30 patch 0 prn   predniSONE (DELTASONE) 20 MG tablet Take 2 tablets (40 mg total) by mouth  daily with breakfast. (Patient not taking: Reported on 01/15/2022) 10 tablet 0 Not Taking   Scheduled:   buprenorphine  1 patch Transdermal Weekly   lactulose  30 g Oral BID   lidocaine  1 patch Transdermal Q24H   mometasone-formoterol  2 puff Inhalation BID   multivitamin with minerals  1 tablet Oral Daily   pantoprazole  40 mg Oral Daily   Infusions:   cefTRIAXone (ROCEPHIN)  IV     magnesium sulfate bolus IVPB 4 g (01/16/22 1122)   metronidazole 500 mg (01/16/22 1057)   PRN: ALPRAZolam, gabapentin, HYDROcodone-acetaminophen, hydrocortisone, HYDROmorphone (DILAUDID) injection, ipratropium-albuterol, loperamide, magnesium hydroxide, metoCLOPramide (REGLAN) injection, traZODone  Assessment: 46yo female w/ PMH liver failure/alcohol related cirrhosis, history of HBV/HCV/ acetaminophen over use/ history of esophageal varices presents to ED with reports of intermittent N/V/D and left sided generalized abdominal pain for several  day with some vomiting. States she has not been able to get refills from her pain clinic for her Norco so has been using up to 6g/acetaminophen in a 24h period for several days. Last alcoholic beverage and APAP dose yesterday. Pharmacy consulted for initiation of N-Acetylcysteine infusion given concern for tylenol overdose in the setting of alcoholic cirrhosis.   Goal of Therapy:  LFTs WNL  Plan:  Recommend discontinuing Norco 5-325 QID PRN and switching to Oxyocodone '5mg'$  QID PRN. Notified MD.  Tylenol levels returned at 15 ug/mL (WNL). Per GI team instructions, will continue Acetadote for 24 hours.    Darrick Penna 01/16/2022,12:35 PM

## 2022-01-16 NOTE — Assessment & Plan Note (Addendum)
Met criteria for severe sepsis on admission given lactic acidosis, tachycardia and tachypnea with source as SBP.  Placed on IV Rocephin and Flagyl.  Unfortunately, unable to get paracentesis due to minimal amount of accessible ascites.  SBP's in the setting of alcoholic liver cirrhosis with associated ascites.  By 8/18, patient had completed antibiotic course.

## 2022-01-16 NOTE — Assessment & Plan Note (Signed)
-   We will continue her [place] patch.

## 2022-01-16 NOTE — Plan of Care (Signed)

## 2022-01-17 DIAGNOSIS — K7031 Alcoholic cirrhosis of liver with ascites: Secondary | ICD-10-CM | POA: Diagnosis not present

## 2022-01-17 DIAGNOSIS — K7011 Alcoholic hepatitis with ascites: Secondary | ICD-10-CM

## 2022-01-17 DIAGNOSIS — K652 Spontaneous bacterial peritonitis: Secondary | ICD-10-CM | POA: Diagnosis not present

## 2022-01-17 LAB — COMPREHENSIVE METABOLIC PANEL
ALT: 45 U/L — ABNORMAL HIGH (ref 0–44)
AST: 108 U/L — ABNORMAL HIGH (ref 15–41)
Albumin: 2 g/dL — ABNORMAL LOW (ref 3.5–5.0)
Alkaline Phosphatase: 86 U/L (ref 38–126)
Anion gap: 9 (ref 5–15)
BUN: <5 mg/dL — ABNORMAL LOW (ref 6–20)
CO2: 28 mmol/L (ref 22–32)
Calcium: 6.7 mg/dL — ABNORMAL LOW (ref 8.9–10.3)
Chloride: 95 mmol/L — ABNORMAL LOW (ref 98–111)
Creatinine, Ser: <0.3 mg/dL — ABNORMAL LOW (ref 0.44–1.00)
Glucose, Bld: 137 mg/dL — ABNORMAL HIGH (ref 70–99)
Potassium: 2.7 mmol/L — CL (ref 3.5–5.1)
Sodium: 132 mmol/L — ABNORMAL LOW (ref 135–145)
Total Bilirubin: 17.8 mg/dL — ABNORMAL HIGH (ref 0.3–1.2)
Total Protein: 6 g/dL — ABNORMAL LOW (ref 6.5–8.1)

## 2022-01-17 LAB — AFP TUMOR MARKER: AFP, Serum, Tumor Marker: 2.1 ng/mL (ref 0.0–6.4)

## 2022-01-17 LAB — CBC
HCT: 31.4 % — ABNORMAL LOW (ref 36.0–46.0)
Hemoglobin: 11.6 g/dL — ABNORMAL LOW (ref 12.0–15.0)
MCH: 35.9 pg — ABNORMAL HIGH (ref 26.0–34.0)
MCHC: 36.9 g/dL — ABNORMAL HIGH (ref 30.0–36.0)
MCV: 97.2 fL (ref 80.0–100.0)
Platelets: 70 10*3/uL — ABNORMAL LOW (ref 150–400)
RBC: 3.23 MIL/uL — ABNORMAL LOW (ref 3.87–5.11)
RDW: 20.6 % — ABNORMAL HIGH (ref 11.5–15.5)
WBC: 5.1 10*3/uL (ref 4.0–10.5)
nRBC: 0 % (ref 0.0–0.2)

## 2022-01-17 LAB — ACETAMINOPHEN LEVEL: Acetaminophen (Tylenol), Serum: <10 ug/mL — ABNORMAL LOW (ref 10–30)

## 2022-01-17 LAB — HCV RNA DIAGNOSIS, NAA: HCV RNA, Quantitation: NOT DETECTED IU/mL

## 2022-01-17 LAB — HEPATITIS B DNA, ULTRAQUANTITATIVE, PCR
HBV DNA SERPL PCR-ACNC: NOT DETECTED IU/mL
HBV DNA SERPL PCR-LOG IU: UNDETERMINED log10 IU/mL

## 2022-01-17 LAB — MAGNESIUM: Magnesium: 2.4 mg/dL (ref 1.7–2.4)

## 2022-01-17 LAB — AMMONIA: Ammonia: 83 umol/L — ABNORMAL HIGH (ref 9–35)

## 2022-01-17 MED ORDER — POTASSIUM CHLORIDE CRYS ER 20 MEQ PO TBCR
60.0000 meq | EXTENDED_RELEASE_TABLET | Freq: Once | ORAL | Status: AC
  Administered 2022-01-17: 60 meq via ORAL
  Filled 2022-01-17: qty 3

## 2022-01-17 MED ORDER — OXYCODONE HCL 5 MG PO TABS
5.0000 mg | ORAL_TABLET | ORAL | Status: DC | PRN
  Administered 2022-01-17 – 2022-01-21 (×18): 5 mg via ORAL
  Filled 2022-01-17 (×18): qty 1

## 2022-01-17 MED ORDER — POTASSIUM CHLORIDE 20 MEQ PO PACK: 40.0000 meq | PACK | Freq: Two times a day (BID) | ORAL | Status: DC

## 2022-01-17 MED ORDER — POTASSIUM CHLORIDE 20 MEQ PO PACK
40.0000 meq | PACK | Freq: Once | ORAL | Status: AC
  Administered 2022-01-17: 40 meq via ORAL
  Filled 2022-01-17: qty 2

## 2022-01-17 MED ORDER — HYDROMORPHONE HCL 1 MG/ML IJ SOLN
0.5000 mg | INTRAMUSCULAR | Status: DC | PRN
  Administered 2022-01-17 – 2022-01-21 (×15): 0.5 mg via INTRAVENOUS
  Filled 2022-01-17 (×15): qty 1

## 2022-01-17 MED ORDER — POTASSIUM CHLORIDE 20 MEQ PO PACK
40.0000 meq | PACK | Freq: Once | ORAL | Status: DC
  Filled 2022-01-17: qty 2

## 2022-01-17 NOTE — Progress Notes (Signed)
[facility] Poison control called and asked for an update of Labs, Acetaminophen levels, EKG and if Acetadote is still ongoing. Given report and was advised to get labs rechecked and to have an order for latest EKG and to have an order for Potassium replacement.

## 2022-01-17 NOTE — Progress Notes (Signed)
PROGRESS NOTE    Rhonda Martin  UUV:[id number] DOB: 30-Aug-1975 DOA: 01/15/2022 PCP: Jon Billings, NP    Assessment & Plan:   Active Problems:   Sepsis ([place])   Spontaneous bacterial peritonitis (HCC)   Hyperammonemia (HCC)   Hypokalemia   GERD without esophagitis   Chronic pain   Chronic obstructive pulmonary disease (COPD) (HCC)  Assessment and Plan: Severe sepsis: met criteria w/ tachycardia, tachypnea, elevated lactic acid & possible SBP. Continue on IV abxs. 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 MELD score 27. Last drink of alcohol was 01/15/22. Hx of HBV/HCV. GI following and recs   Possible spontaneous bacterial peritonitis: IR consulted for paracentesis but not enough ascites present as per IR so unable to do. Continue on IV flagyl, rocephin.   Tylenol use in setting of cirrhosis: continue on NAC as per GI    Hyperammonemia: continue on lactulose    Hyperbilirubinemia: likely secondary to alcoholic cirrhosis. Still elevated but trending down. GI following   Possible liver mass: as per CT. Will need MRI abd/pelvis outpatient to further evaluate   Thrombocytopenia: likely secondary to alcohol abuse & cirrhosis   Hypomagnesemia: WNL today   Hypocalcemia: s/p ca gluconate x1. Corrected Ca 8.3.    Hx of right AKA: continue w/ supportive care    GERD: continue on PPI    Hypokalemia: potassium given     Chronic pain: continue on home buprenorphine patch    COPD exacerbation: continue bronchodilators         DVT prophylaxis: SCDs Code Status: full  Family Communication:  Disposition Plan: likely d/c back home   Level of care: Telemetry Medical  Status is: Inpatient Remains inpatient appropriate because: severity of illness   Consultants:  GI   Procedures:   Antimicrobials: rocephin, flagyl   Subjective: Pt c/o abd pain again today   Objective: Vitals:   01/16/22 2027 01/17/22 0442 01/17/22 0450 01/17/22  0753  BP: 125/76 120/74 120/74 128/61  Pulse: (!) 110 (!) 117 (!) 110 (!) 109  Resp: 17 20 20 20   Temp: 99 F (37.2 C) 98.5 F (36.9 C) 98.5 F (36.9 C) 98.7 F (37.1 C)  TempSrc: Oral     SpO2: 93% 94% 95% 91%  Weight:      Height:        Intake/Output Summary (Last 24 hours) at 01/17/2022 0802 Last data filed at 01/17/2022 0603 Gross per 24 hour  Intake 317.02 ml  Output --  Net 317.02 ml   Filed Weights   01/15/22 1726  Weight: 68 kg    Examination:  General exam: Appears calm but uncomfortable. Jaundice  Respiratory system: clear breath sounds b/l  Cardiovascular system: S1/S2+. No rubs or clicks  Gastrointestinal system: Abd is soft, tender to palpation & normal bowel sounds Central nervous system: alert and oriented. Moves all extremities  Psychiatry: Judgement and insight appears poor. Flat mood and affect    Data Reviewed: I have personally reviewed following labs and imaging studies  CBC: Recent Labs  Lab 01/15/22 1734 01/17/22 0506  WBC 6.9 5.1  HGB 13.7 11.6*  HCT 38.6 31.4*  MCV 98.2 97.2  PLT 84* 70*   Basic Metabolic Panel: Recent Labs  Lab 01/15/22 1734 01/16/22 0521 01/17/22 0506  NA 132* 133* 132*  K 2.6* 3.5 2.7*  CL 91* 97* 95*  CO2 25 29 28   GLUCOSE 112* 115* 137*  BUN <5* <5* <5*  CREATININE 0.33* 0.33* <0.30*  CALCIUM  7.4* 6.7* 6.7*  MG  --  1.4* 2.4   GFR: CrCl cannot be calculated (This lab value cannot be used to calculate CrCl because it is not a number: <0.30). Liver Function Tests: Recent Labs  Lab 01/15/22 1734 01/16/22 0521 01/17/22 0506  AST 173* 140* 108*  ALT 61* 48* 45*  ALKPHOS 122 103 86  BILITOT 20.5* 18.2* 17.8*  PROT 7.2 6.2* 6.0*  ALBUMIN 2.5* 2.2* 2.0*   Recent Labs  Lab 01/15/22 1734  LIPASE 32   Recent Labs  Lab 01/15/22 1734 01/16/22 0521 01/17/22 0506  AMMONIA 67* 82* 83*   Coagulation Profile: Recent Labs  Lab 01/16/22 0521  INR 1.8*   Cardiac Enzymes: No results for  input(s): "CKTOTAL", "CKMB", "CKMBINDEX", "TROPONINI" in the last 168 hours. BNP (last 3 results) No results for input(s): "PROBNP" in the last 8760 hours. HbA1C: No results for input(s): "HGBA1C" in the last 72 hours. CBG: No results for input(s): "GLUCAP" in the last 168 hours. Lipid Profile: No results for input(s): "CHOL", "HDL", "LDLCALC", "TRIG", "CHOLHDL", "LDLDIRECT" in the last 72 hours. Thyroid Function Tests: No results for input(s): "TSH", "T4TOTAL", "FREET4", "T3FREE", "THYROIDAB" in the last 72 hours. Anemia Panel: No results for input(s): "VITAMINB12", "FOLATE", "FERRITIN", "TIBC", "IRON", "RETICCTPCT" in the last 72 hours. Sepsis Labs: Recent Labs  Lab 01/15/22 1734 01/15/22 2000 01/16/22 0521  PROCALCITON  --   --  0.31  LATICACIDVEN 6.5* 5.1*  --     Recent Results (from the past 240 hour(s))  Blood culture (routine x 2)     Status: None (Preliminary result)   Collection Time: 01/15/22  6:36 PM   Specimen: BLOOD LEFT ARM  Result Value Ref Range Status   Specimen Description BLOOD LEFT ARM  Final   Special Requests   Final    BOTTLES DRAWN AEROBIC AND ANAEROBIC Blood Culture adequate volume   Culture   Final    NO GROWTH 2 DAYS Performed at [facility], [address] [place], [place] [address]    Report Status PENDING  Incomplete  Blood culture (routine x 2)     Status: None (Preliminary result)   Collection Time: 01/15/22  6:36 PM   Specimen: Left Antecubital; Blood  Result Value Ref Range Status   Specimen Description LEFT ANTECUBITAL  Final   Special Requests   Final    BOTTLES DRAWN AEROBIC AND ANAEROBIC Blood Culture results may not be optimal due to an excessive volume of blood received in culture bottles   Culture   Final    NO GROWTH 2 DAYS Performed at [facility], [address], [place], [place] [address]    Report Status PENDING  Incomplete  SARS Coronavirus 2 by RT PCR (hospital order, performed in [place]  hospital lab) *cepheid single result test* Anterior Nasal Swab     Status: None   Collection Time: 01/15/22  8:00 PM   Specimen: Anterior Nasal Swab  Result Value Ref Range Status   SARS Coronavirus 2 by RT PCR NEGATIVE NEGATIVE Final    Comment: Performed at [facility], [address] [place], [place] [address]         Radiology Studies: MR ABDOMEN W WO CONTRAST  Result Date: 01/16/2022 CLINICAL DATA:  Liver failure.  Cirrhosis. EXAM: MRI ABDOMEN WITHOUT AND WITH CONTRAST TECHNIQUE: Multiplanar multisequence MR imaging of the abdomen was performed both before and after the administration of intravenous contrast. CONTRAST:  7.110m GADAVIST GADOBUTROL 1 MMOL/ML IV SOLN  COMPARISON:  Abdominal ultrasound 01/16/2022. Abdominopelvic CT 01/15/2022 and 10/01/2021. FINDINGS: Lower chest: No significant findings are demonstrated at the lung bases. Hepatobiliary: As previously demonstrated, the liver is grossly abnormal with changes of advanced hepatic cirrhosis. There is diffuse contour irregularity of the liver with relative enlargement of the left and caudate lobes. There is heterogeneous loss of signal on the gradient echo opposed phase and FISP images, consistent with heterogeneous steatosis. There is also mild T2 heterogeneity but no significant restricted diffusion. Following contrast, there is diffuse heterogeneous enhancement of the liver, but no arterial phase enhancing lesions are identified. Appearance favors confluent hepatic fibrosis with multiple regenerating nodules. Possible small dependent gallstones. No gallbladder wall thickening or biliary dilatation. The portal and hepatic veins are patent. Recanalized left paraumbilical vein noted. Pancreas: Unremarkable. No pancreatic ductal dilatation or surrounding inflammatory changes. Spleen: Measures 15.0 x 7.8 x 11.0 cm (volume = 670 cm^3), consistent with mild to moderate splenomegaly. No focal abnormality. Adrenals/Urinary Tract: Both  adrenal glands appear normal. Both kidneys appear unremarkable. No evidence of renal mass or hydronephrosis. Bladder not imaged. Stomach/Bowel: Mild generalized bowel wall thickening, attributed to liver disease and ascites. No significant bowel distension or focal abnormality identified. Vascular/Lymphatic: There are no enlarged abdominal lymph nodes. Patent hepatic and portal veins. The IVC is patent. Recanalized left paraumbilical vein with scattered mesenteric and abdominal wall collaterals. Other: Small to moderate amount of ascites without abnormal enhancement. Generalized mesenteric edema. Musculoskeletal: Numerous grossly stable thoracolumbar compression deformities. No acute osseous findings are evident. IMPRESSION: 1. Changes of advanced hepatic cirrhosis with diffusely heterogeneous steatosis and enhancement, favored to reflect confluent hepatic fibrosis with multiple regenerating nodules. No focal arterial phase enhancing lesions identified to suggest infiltrative hepatocellular carcinoma. 2. Mild portal hypertension with mild splenomegaly and portosystemic collaterals. 3. Small to moderate amount of ascites. Electronically Signed   By: Richardean Sale M.D.   On: 01/16/2022 14:34   US Abdomen Limited  Result Date: 01/16/2022 CLINICAL DATA:  Ascites EXAM: LIMITED ABDOMEN ULTRASOUND FOR ASCITES TECHNIQUE: Limited ultrasound survey for ascites was performed in all four abdominal quadrants. COMPARISON:  None Available. FINDINGS: Limited sonographic exam of the 4 quadrants of the abdomen was performed for fluid assessment. Images demonstrate no ascites. No intervention performed. IMPRESSION: No ascites.  No paracentesis performed. Electronically Signed   By: Albin Felling M.D.   On: 01/16/2022 13:38   DG Chest Portable 1 View  Result Date: 01/15/2022 CLINICAL DATA:  Sepsis.  Jaundice.  Severe abdominal pain. EXAM: PORTABLE CHEST 1 VIEW COMPARISON:  One-view chest x-ray 10/01/2021 FINDINGS: Heart size  is normal. Increased diffuse interstitial pattern is consistent with edema. No focal airspace consolidation is present. Visualized soft tissues and bony thorax are unremarkable. IMPRESSION: Increased diffuse interstitial pattern consistent with edema. Electronically Signed   By: San Morelle M.D.   On: 01/15/2022 19:41   CT ABDOMEN PELVIS W CONTRAST  Result Date: 01/15/2022 CLINICAL DATA:  Abdominal pain, acute, nonlocalized abd pain swelling. Pt to triage via wheelchair. Pt has abd pain with swelling pt states her pmd sent her to er for eval of abd pain. Pt has n/v. Hx liver problems. EXAM: CT ABDOMEN AND PELVIS WITH CONTRAST TECHNIQUE: Multidetector CT imaging of the abdomen and pelvis was performed using the standard protocol following bolus administration of intravenous contrast. RADIATION DOSE REDUCTION: This exam was performed according to the departmental dose-optimization program which includes automated exposure control, adjustment of the mA and/or kV according to patient size and/or use of iterative  reconstruction technique. CONTRAST:  173m OMNIPAQUE IOHEXOL 300 MG/ML  SOLN COMPARISON:  Ultrasound abdomen 07/14/2021 FINDINGS: Lower chest: No acute abnormality. Hepatobiliary: Nodular hepatic contour. Markedly heterogeneous hepatic parenchyma with query underlying mass lesions. Calcified gallstone within the gallbladder lumen. No gallbladder wall thickening or pericholecystic fluid. No biliary dilatation. Pancreas: No focal lesion. Normal pancreatic contour. No surrounding inflammatory changes. No main pancreatic ductal dilatation. Spleen: Normal in size without focal abnormality. Adrenals/Urinary Tract: No adrenal nodule bilaterally. Bilateral kidneys enhance symmetrically. No hydronephrosis. No hydroureter. The urinary bladder is unremarkable. Stomach/Bowel: Stomach is within normal limits. No evidence of bowel wall thickening or dilatation. Appendix appears normal. Vascular/Lymphatic:  Recanalized paraumbilical vein. Perigastric venous collaterals. The portal, splenic, superior mesenteric veins are patent. No abdominal aorta or iliac aneurysm. Mild to moderate atherosclerotic plaque of the aorta and its branches. No abdominal, pelvic, or inguinal lymphadenopathy. Reproductive: Uterus and bilateral adnexa are unremarkable. Other: Small volume simple free fluid. No intraperitoneal free gas. No organized fluid collection. Musculoskeletal: Tiny fat containing umbilical hernia. No suspicious lytic or blastic osseous lesions. No acute displaced fracture. Multilevel degenerative changes of the spine. Chronic appearing anterior wedge compression fracture of the T10 and L1 vertebral bodies. Superior endplate Schmorl node with concavity at the L3 level. IMPRESSION: 1. Cirrhosis with portal hypertension. Diffusely heterogeneous hepatic parenchyma with query underlying mass lesions concerning for malignancy. When the patient is clinically stable and able to follow directions and hold their breath (preferably as an outpatient) further evaluation with dedicated abdominal MRI liver protocol should be considered. 2. Small volume simple free fluid ascites. 3. Cholelithiasis with no CT finding of acute cholecystitis. 4.  Aortic Atherosclerosis (ICD10-I70.0). Electronically Signed   By: MIven FinnM.D.   On: 01/15/2022 18:56        Scheduled Meds:  buprenorphine  1 patch Transdermal Weekly   lactulose  30 g Oral BID   lidocaine  1 patch Transdermal Q24H   mometasone-formoterol  2 puff Inhalation BID   multivitamin with minerals  1 tablet Oral Daily   pantoprazole  40 mg Oral Daily   potassium chloride  60 mEq Oral Once   Continuous Infusions:  acetylcysteine Stopped (01/16/22 1508)   cefTRIAXone (ROCEPHIN)  IV 2 g (01/16/22 1829)   metronidazole Stopped (01/16/22 2206)     LOS: 2 days    Time spent: 36 mins     JWyvonnia Dusky MD [facility] Pager [phone]  If  7PM-7AM, please contact night-coverage [email or web address] 01/17/2022, 8:02 AM

## 2022-01-17 NOTE — Progress Notes (Signed)
Cross Cover EKG and potassium level ordered per poison control request

## 2022-01-17 NOTE — Progress Notes (Signed)
Cephas Darby, MD [address]  [place]  [place], [place] [address]  Main: [phone]  Fax: [phone] Pager: [phone]   Subjective: No acute events overnight.  She reports generalized abdominal pain   Objective: Vital signs in last 24 hours: Vitals:   01/16/22 2027 01/17/22 0442 01/17/22 0450 01/17/22 0753  BP: 125/76 120/74 120/74 128/61  Pulse: (!) 110 (!) 117 (!) 110 (!) 109  Resp: '17 20 20 20  '$ Temp: 99 F (37.2 C) 98.5 F (36.9 C) 98.5 F (36.9 C) 98.7 F (37.1 C)  TempSrc: Oral     SpO2: 93% 94% 95% 91%  Weight:      Height:       Weight change:   Intake/Output Summary (Last 24 hours) at 01/17/2022 1346 Last data filed at 01/17/2022 1045 Gross per 24 hour  Intake 317.02 ml  Output --  Net 317.02 ml     Exam: Heart: Tachycardia, regular rhythm Lungs: normal and clear to auscultation Abdomen: soft, nontender, normal bowel sounds   Lab Results:    Latest Ref Rng & Units 01/17/2022    5:06 AM 01/15/2022    5:34 PM 10/01/2021    6:43 PM  CBC  WBC 4.0 - 10.5 K/uL 5.1  6.9  7.3   Hemoglobin 12.0 - 15.0 g/dL 11.6  13.7  14.9   Hematocrit 36.0 - 46.0 % 31.4  38.6  44.2   Platelets 150 - 400 K/uL 70  84  87       Latest Ref Rng & Units 01/17/2022    5:06 AM 01/16/2022    5:21 AM 01/15/2022    5:34 PM  CMP  Glucose 70 - 99 mg/dL 137  115  112   BUN 6 - 20 mg/dL <5  <5  <5   Creatinine 0.44 - 1.00 mg/dL <0.30  0.33  0.33   Sodium 135 - 145 mmol/L 132  133  132   Potassium 3.5 - 5.1 mmol/L 2.7  3.5  2.6   Chloride 98 - 111 mmol/L 95  97  91   CO2 22 - 32 mmol/L '28  29  25   '$ Calcium 8.9 - 10.3 mg/dL 6.7  6.7  7.4   Total Protein 6.5 - 8.1 g/dL 6.0  6.2  7.2   Total Bilirubin 0.3 - 1.2 mg/dL 17.8  18.2  20.5   Alkaline Phos 38 - 126 U/L 86  103  122   AST 15 - 41 U/L 108  140  173   ALT 0 - 44 U/L 45  48  61     Micro Results: Recent Results (from the past 240 hour(s))  Blood culture (routine x 2)     Status: None (Preliminary  result)   Collection Time: 01/15/22  6:36 PM   Specimen: BLOOD LEFT ARM  Result Value Ref Range Status   Specimen Description BLOOD LEFT ARM  Final   Special Requests   Final    BOTTLES DRAWN AEROBIC AND ANAEROBIC Blood Culture adequate volume   Culture   Final    NO GROWTH 2 DAYS Performed at [facility], [address] [place], [place] [address]    Report Status PENDING  Incomplete  Blood culture (routine x 2)     Status: None (Preliminary result)   Collection Time: 01/15/22  6:36 PM   Specimen: Left Antecubital; Blood  Result Value Ref Range Status   Specimen Description LEFT ANTECUBITAL  Final   Special  Requests   Final    BOTTLES DRAWN AEROBIC AND ANAEROBIC Blood Culture results may not be optimal due to an excessive volume of blood received in culture bottles   Culture   Final    NO GROWTH 2 DAYS Performed at [facility], [address] [place], [place] [address]    Report Status PENDING  Incomplete  SARS Coronavirus 2 by RT PCR (hospital order, performed in [facility] hospital lab) *cepheid single result test* Anterior Nasal Swab     Status: None   Collection Time: 01/15/22  8:00 PM   Specimen: Anterior Nasal Swab  Result Value Ref Range Status   SARS Coronavirus 2 by RT PCR NEGATIVE NEGATIVE Final    Comment: Performed at [facility], [address] [place], [place] [address]   Studies/Results: MR ABDOMEN W WO CONTRAST  Result Date: 01/16/2022 CLINICAL DATA:  Liver failure.  Cirrhosis. EXAM: MRI ABDOMEN WITHOUT AND WITH CONTRAST TECHNIQUE: Multiplanar multisequence MR imaging of the abdomen was performed both before and after the administration of intravenous contrast. CONTRAST:  7.36m GADAVIST GADOBUTROL 1 MMOL/ML IV SOLN COMPARISON:  Abdominal ultrasound 01/16/2022. Abdominopelvic CT 01/15/2022 and 10/01/2021. FINDINGS: Lower chest: No significant findings are demonstrated at the lung bases. Hepatobiliary: As previously demonstrated,  the liver is grossly abnormal with changes of advanced hepatic cirrhosis. There is diffuse contour irregularity of the liver with relative enlargement of the left and caudate lobes. There is heterogeneous loss of signal on the gradient echo opposed phase and FISP images, consistent with heterogeneous steatosis. There is also mild T2 heterogeneity but no significant restricted diffusion. Following contrast, there is diffuse heterogeneous enhancement of the liver, but no arterial phase enhancing lesions are identified. Appearance favors confluent hepatic fibrosis with multiple regenerating nodules. Possible small dependent gallstones. No gallbladder wall thickening or biliary dilatation. The portal and hepatic veins are patent. Recanalized left paraumbilical vein noted. Pancreas: Unremarkable. No pancreatic ductal dilatation or surrounding inflammatory changes. Spleen: Measures 15.0 x 7.8 x 11.0 cm (volume = 670 cm^3), consistent with mild to moderate splenomegaly. No focal abnormality. Adrenals/Urinary Tract: Both adrenal glands appear normal. Both kidneys appear unremarkable. No evidence of renal mass or hydronephrosis. Bladder not imaged. Stomach/Bowel: Mild generalized bowel wall thickening, attributed to liver disease and ascites. No significant bowel distension or focal abnormality identified. Vascular/Lymphatic: There are no enlarged abdominal lymph nodes. Patent hepatic and portal veins. The IVC is patent. Recanalized left paraumbilical vein with scattered mesenteric and abdominal wall collaterals. Other: Small to moderate amount of ascites without abnormal enhancement. Generalized mesenteric edema. Musculoskeletal: Numerous grossly stable thoracolumbar compression deformities. No acute osseous findings are evident. IMPRESSION: 1. Changes of advanced hepatic cirrhosis with diffusely heterogeneous steatosis and enhancement, favored to reflect confluent hepatic fibrosis with multiple regenerating nodules. No  focal arterial phase enhancing lesions identified to suggest infiltrative hepatocellular carcinoma. 2. Mild portal hypertension with mild splenomegaly and portosystemic collaterals. 3. Small to moderate amount of ascites. Electronically Signed   By: WRichardean SaleM.D.   On: 01/16/2022 14:34   UKoreaAbdomen Limited  Result Date: 01/16/2022 CLINICAL DATA:  Ascites EXAM: LIMITED ABDOMEN ULTRASOUND FOR ASCITES TECHNIQUE: Limited ultrasound survey for ascites was performed in all four abdominal quadrants. COMPARISON:  None Available. FINDINGS: Limited sonographic exam of the 4 quadrants of the abdomen was performed for fluid assessment. Images demonstrate no ascites. No intervention performed. IMPRESSION: No ascites.  No paracentesis performed. Electronically Signed   By: YAlbin FellingM.D.   On: 01/16/2022 13:38  DG Chest Portable 1 View  Result Date: 01/15/2022 CLINICAL DATA:  Sepsis.  Jaundice.  Severe abdominal pain. EXAM: PORTABLE CHEST 1 VIEW COMPARISON:  One-view chest x-ray 10/01/2021 FINDINGS: Heart size is normal. Increased diffuse interstitial pattern is consistent with edema. No focal airspace consolidation is present. Visualized soft tissues and bony thorax are unremarkable. IMPRESSION: Increased diffuse interstitial pattern consistent with edema. Electronically Signed   By: San Morelle M.D.   On: 01/15/2022 19:41   CT ABDOMEN PELVIS W CONTRAST  Result Date: 01/15/2022 CLINICAL DATA:  Abdominal pain, acute, nonlocalized abd pain swelling. Pt to triage via wheelchair. Pt has abd pain with swelling pt states her pmd sent her to er for eval of abd pain. Pt has n/v. Hx liver problems. EXAM: CT ABDOMEN AND PELVIS WITH CONTRAST TECHNIQUE: Multidetector CT imaging of the abdomen and pelvis was performed using the standard protocol following bolus administration of intravenous contrast. RADIATION DOSE REDUCTION: This exam was performed according to the departmental dose-optimization program  which includes automated exposure control, adjustment of the mA and/or kV according to patient size and/or use of iterative reconstruction technique. CONTRAST:  180m OMNIPAQUE IOHEXOL 300 MG/ML  SOLN COMPARISON:  Ultrasound abdomen 07/14/2021 FINDINGS: Lower chest: No acute abnormality. Hepatobiliary: Nodular hepatic contour. Markedly heterogeneous hepatic parenchyma with query underlying mass lesions. Calcified gallstone within the gallbladder lumen. No gallbladder wall thickening or pericholecystic fluid. No biliary dilatation. Pancreas: No focal lesion. Normal pancreatic contour. No surrounding inflammatory changes. No main pancreatic ductal dilatation. Spleen: Normal in size without focal abnormality. Adrenals/Urinary Tract: No adrenal nodule bilaterally. Bilateral kidneys enhance symmetrically. No hydronephrosis. No hydroureter. The urinary bladder is unremarkable. Stomach/Bowel: Stomach is within normal limits. No evidence of bowel wall thickening or dilatation. Appendix appears normal. Vascular/Lymphatic: Recanalized paraumbilical vein. Perigastric venous collaterals. The portal, splenic, superior mesenteric veins are patent. No abdominal aorta or iliac aneurysm. Mild to moderate atherosclerotic plaque of the aorta and its branches. No abdominal, pelvic, or inguinal lymphadenopathy. Reproductive: Uterus and bilateral adnexa are unremarkable. Other: Small volume simple free fluid. No intraperitoneal free gas. No organized fluid collection. Musculoskeletal: Tiny fat containing umbilical hernia. No suspicious lytic or blastic osseous lesions. No acute displaced fracture. Multilevel degenerative changes of the spine. Chronic appearing anterior wedge compression fracture of the T10 and L1 vertebral bodies. Superior endplate Schmorl node with concavity at the L3 level. IMPRESSION: 1. Cirrhosis with portal hypertension. Diffusely heterogeneous hepatic parenchyma with query underlying mass lesions concerning for  malignancy. When the patient is clinically stable and able to follow directions and hold their breath (preferably as an outpatient) further evaluation with dedicated abdominal MRI liver protocol should be considered. 2. Small volume simple free fluid ascites. 3. Cholelithiasis with no CT finding of acute cholecystitis. 4.  Aortic Atherosclerosis (ICD10-I70.0). Electronically Signed   By: MIven FinnM.D.   On: 01/15/2022 18:56   Medications: I have reviewed the patient's current medications. Prior to Admission:  Medications Prior to Admission  Medication Sig Dispense Refill Last Dose   BUTRANS 7.5 MCG/HR 1 patch once a week.   Past Month   gabapentin (NEURONTIN) 300 MG capsule Take 300 mg by mouth 2 (two) times daily as needed.   01/15/2022   HYDROcodone-acetaminophen (NORCO/VICODIN) 5-325 MG tablet Take 1 tablet by mouth 4 (four) times daily as needed.   Past Month   mometasone-formoterol (DULERA) 200-5 MCG/ACT AERO Inhale 2 puffs into the lungs 2 (two) times daily. 13 g 0 01/15/2022   Multiple Vitamin (MULTIVITAMIN WITH MINERALS)  TABS tablet Take 1 tablet by mouth daily. 90 tablet 1 01/15/2022   omeprazole (PRILOSEC) 40 MG capsule Take 1 capsule (40 mg total) by mouth 2 (two) times daily before a meal. 60 capsule 3 01/15/2022   hydrocortisone (ANUSOL-HC) 2.5 % rectal cream Place 1 application. rectally 2 (two) times daily. 30 g 0 prn   lidocaine (LIDODERM) 5 % Place 1 patch onto the skin daily. Remove & Discard patch within 12 hours or as directed by MD 30 patch 0 prn   predniSONE (DELTASONE) 20 MG tablet Take 2 tablets (40 mg total) by mouth daily with breakfast. (Patient not taking: Reported on 01/15/2022) 10 tablet 0 Not Taking   Scheduled:  buprenorphine  1 patch Transdermal Weekly   lactulose  30 g Oral BID   lidocaine  1 patch Transdermal Q24H   mometasone-formoterol  2 puff Inhalation BID   multivitamin with minerals  1 tablet Oral Daily   pantoprazole  40 mg Oral Daily   Continuous:   acetylcysteine Stopped (01/16/22 1508)   cefTRIAXone (ROCEPHIN)  IV 2 g (01/16/22 1829)   metronidazole 500 mg (01/17/22 1112)   ZOX:[id number], gabapentin, hydrocortisone, HYDROmorphone (DILAUDID) injection, ipratropium-albuterol, loperamide, magnesium hydroxide, metoCLOPramide (REGLAN) injection, oxyCODONE, traZODone Anti-infectives (From admission, onward)    Start     Dose/Rate Route Frequency Ordered Stop   01/16/22 1800  cefTRIAXone (ROCEPHIN) 2 g in sodium chloride 0.9 % 100 mL IVPB        2 g 200 mL/hr over 30 Minutes Intravenous Every 24 hours 01/15/22 2040 01/23/22 1759   01/15/22 2200  metroNIDAZOLE (FLAGYL) IVPB 500 mg        500 mg 100 mL/hr over 60 Minutes Intravenous Every 12 hours 01/15/22 2040 01/22/22 2159   01/15/22 1830  cefTRIAXone (ROCEPHIN) 2 g in sodium chloride 0.9 % 100 mL IVPB        2 g 200 mL/hr over 30 Minutes Intravenous  Once 01/15/22 1827 01/15/22 1952      Scheduled Meds:  buprenorphine  1 patch Transdermal Weekly   lactulose  30 g Oral BID   lidocaine  1 patch Transdermal Q24H   mometasone-formoterol  2 puff Inhalation BID   multivitamin with minerals  1 tablet Oral Daily   pantoprazole  40 mg Oral Daily   Continuous Infusions:  acetylcysteine Stopped (01/16/22 1508)   cefTRIAXone (ROCEPHIN)  IV 2 g (01/16/22 1829)   metronidazole 500 mg (01/17/22 1112)   PRN Meds:.ALPRAZolam, gabapentin, hydrocortisone, HYDROmorphone (DILAUDID) injection, ipratropium-albuterol, loperamide, magnesium hydroxide, metoCLOPramide (REGLAN) injection, oxyCODONE, traZODone   Assessment: Active Problems:   Hypokalemia   Sepsis (HCC)   Spontaneous bacterial peritonitis (HCC)   GERD without esophagitis   Chronic pain   Hyperammonemia (HCC)   Chronic obstructive pulmonary disease (COPD) ([place])  46 year old female with history of drug abuse, chronic alcohol use, decompensated cirrhosis of liver presented with acute alcoholic hepatitis and Tylenol overdose, being  treated for SBP  Plan: Acute alcoholic hepatitis and Tylenol overdose Patient is currently on NAC She is currently on empiric antibiotics for possible infection MRI showed mild to moderate ascites.  Paracentesis was attempted but not performed due to minimal ascites LFTs are downtrending Maddrey's discriminant function score is 59.1 on admission, prednisone has not been recommended due to concern for infection and currently being treated for possible SBP Patient is reporting nonbloody diarrheal episodes, recommend stool studies to rule out infection After 48 hours of antibiotics, patient can be started on prednisone 40 mg daily if obvious  source of infection not identified  Decompensated cirrhosis of liver with jaundice and ascites: Secondary to alcohol use No signs of active GI bleed at this time No evidence of liver lesions based on MRI during this admission HRS: None PSE: Currently on lactulose Low-sodium diet  Dr. Allen Norris will cover for tomorrow    LOS: 2 days   Oliver Neuwirth 01/17/2022, 1:46 PM

## 2022-01-18 DIAGNOSIS — E7220 Disorder of urea cycle metabolism, unspecified: Secondary | ICD-10-CM | POA: Diagnosis not present

## 2022-01-18 DIAGNOSIS — K7031 Alcoholic cirrhosis of liver with ascites: Secondary | ICD-10-CM | POA: Diagnosis not present

## 2022-01-18 DIAGNOSIS — K652 Spontaneous bacterial peritonitis: Secondary | ICD-10-CM | POA: Diagnosis not present

## 2022-01-18 LAB — CBC
HCT: 33.4 % — ABNORMAL LOW (ref 36.0–46.0)
Hemoglobin: 12 g/dL (ref 12.0–15.0)
MCH: 35.7 pg — ABNORMAL HIGH (ref 26.0–34.0)
MCHC: 35.9 g/dL (ref 30.0–36.0)
MCV: 99.4 fL (ref 80.0–100.0)
Platelets: 68 10*3/uL — ABNORMAL LOW (ref 150–400)
RBC: 3.36 MIL/uL — ABNORMAL LOW (ref 3.87–5.11)
RDW: 21.8 % — ABNORMAL HIGH (ref 11.5–15.5)
WBC: 5.5 10*3/uL (ref 4.0–10.5)
nRBC: 0 % (ref 0.0–0.2)

## 2022-01-18 LAB — COMPREHENSIVE METABOLIC PANEL
ALT: 36 U/L (ref 0–44)
AST: 97 U/L — ABNORMAL HIGH (ref 15–41)
Albumin: 1.9 g/dL — ABNORMAL LOW (ref 3.5–5.0)
Alkaline Phosphatase: 87 U/L (ref 38–126)
Anion gap: 7 (ref 5–15)
BUN: <5 mg/dL — ABNORMAL LOW (ref 6–20)
CO2: 28 mmol/L (ref 22–32)
Calcium: 6.4 mg/dL — CL (ref 8.9–10.3)
Chloride: 99 mmol/L (ref 98–111)
Creatinine, Ser: <0.3 mg/dL — ABNORMAL LOW (ref 0.44–1.00)
Glucose, Bld: 140 mg/dL — ABNORMAL HIGH (ref 70–99)
Potassium: 2.8 mmol/L — ABNORMAL LOW (ref 3.5–5.1)
Sodium: 134 mmol/L — ABNORMAL LOW (ref 135–145)
Total Bilirubin: 16.9 mg/dL — ABNORMAL HIGH (ref 0.3–1.2)
Total Protein: 5.7 g/dL — ABNORMAL LOW (ref 6.5–8.1)

## 2022-01-18 LAB — AMMONIA: Ammonia: 78 umol/L — ABNORMAL HIGH (ref 9–35)

## 2022-01-18 LAB — POTASSIUM: Potassium: 3.1 mmol/L — ABNORMAL LOW (ref 3.5–5.1)

## 2022-01-18 MED ORDER — METOPROLOL TARTRATE 25 MG PO TABS
12.5000 mg | ORAL_TABLET | Freq: Two times a day (BID) | ORAL | Status: DC
Start: 2022-01-18 — End: 2022-01-23
  Administered 2022-01-18 – 2022-01-23 (×10): 12.5 mg via ORAL
  Filled 2022-01-18 (×11): qty 1

## 2022-01-18 MED ORDER — POTASSIUM CHLORIDE CRYS ER 20 MEQ PO TBCR
40.0000 meq | EXTENDED_RELEASE_TABLET | Freq: Two times a day (BID) | ORAL | Status: AC
Start: 2022-01-18 — End: 2022-01-18
  Administered 2022-01-18 (×2): 40 meq via ORAL
  Filled 2022-01-18 (×2): qty 2

## 2022-01-18 MED ORDER — CALCIUM GLUCONATE-NACL 1-0.675 GM/50ML-% IV SOLN
1.0000 g | Freq: Once | INTRAVENOUS | Status: AC
Start: 2022-01-18 — End: 2022-01-18
  Administered 2022-01-18: 1000 mg via INTRAVENOUS
  Filled 2022-01-18: qty 50

## 2022-01-18 NOTE — Progress Notes (Signed)
Lucilla Lame, MD [address] [place], [place] [address] Phone: [phone] Fax : [phone]   Subjective: Patient reports that she is not feeling much better at this time.  The patient has end-stage liver disease and had drank nearly up to admission.  The patient still reports generalized abdominal pain.   Objective: Vital signs in last 24 hours: Vitals:   01/18/22 0815 01/18/22 0915 01/18/22 1016 01/18/22 1215  BP: 118/74 130/73 119/73 105/60  Pulse: (!) 115 (!) 117 (!) 123 99  Resp: '17 16 16 16  '$ Temp: 98.6 F (37 C) 98.9 F (37.2 C) 98.8 F (37.1 C) 98.2 F (36.8 C)  TempSrc: Oral   Oral  SpO2:   90% 91%  Weight:      Height:       Weight change:   Intake/Output Summary (Last 24 hours) at 01/18/2022 1458 Last data filed at 01/18/2022 0330 Gross per 24 hour  Intake 324.66 ml  Output --  Net 324.66 ml     Exam: Heart:: Regular rate and rhythm, S1S2 present, or without murmur or extra heart sounds Lungs: normal and clear to auscultation and percussion Abdomen: soft, nontender, normal bowel sounds   Lab Results: '@LABTEST2'$ @ Micro Results: Recent Results (from the past 240 hour(s))  Blood culture (routine x 2)     Status: None (Preliminary result)   Collection Time: 01/15/22  6:36 PM   Specimen: BLOOD LEFT ARM  Result Value Ref Range Status   Specimen Description BLOOD LEFT ARM  Final   Special Requests   Final    BOTTLES DRAWN AEROBIC AND ANAEROBIC Blood Culture adequate volume   Culture   Final    NO GROWTH 3 DAYS Performed at [facility], [address], [place], [place] [address]    Report Status PENDING  Incomplete  Blood culture (routine x 2)     Status: None (Preliminary result)   Collection Time: 01/15/22  6:36 PM   Specimen: Left Antecubital; Blood  Result Value Ref Range Status   Specimen Description LEFT ANTECUBITAL  Final   Special Requests   Final    BOTTLES DRAWN AEROBIC AND ANAEROBIC Blood Culture results  may not be optimal due to an excessive volume of blood received in culture bottles   Culture   Final    NO GROWTH 3 DAYS Performed at [facility], [address] [place], [place] [address]    Report Status PENDING  Incomplete  SARS Coronavirus 2 by RT PCR (hospital order, performed in [place] hospital lab) *cepheid single result test* Anterior Nasal Swab     Status: None   Collection Time: 01/15/22  8:00 PM   Specimen: Anterior Nasal Swab  Result Value Ref Range Status   SARS Coronavirus 2 by RT PCR NEGATIVE NEGATIVE Final    Comment: Performed at [facility], [address], [place],  [address]   Studies/Results: No results found. Medications: I have reviewed the patient's current medications. Scheduled Meds:  buprenorphine  1 patch Transdermal Weekly   lactulose  30 g Oral BID   lidocaine  1 patch Transdermal Q24H   metoprolol tartrate  12.5 mg Oral BID   mometasone-formoterol  2 puff Inhalation BID   multivitamin with minerals  1 tablet Oral Daily   pantoprazole  40 mg Oral Daily   potassium chloride  40 mEq Oral BID   Continuous Infusions:  cefTRIAXone (ROCEPHIN)  IV 2 g (01/17/22 1817)  metronidazole 500 mg (01/18/22 0950)   PRN Meds:.ALPRAZolam, gabapentin, hydrocortisone, HYDROmorphone (DILAUDID) injection, ipratropium-albuterol, loperamide, magnesium hydroxide, metoCLOPramide (REGLAN) injection, oxyCODONE, traZODone   Assessment: Active Problems:   Hypokalemia   Sepsis ([place])   Spontaneous bacterial peritonitis (HCC)   GERD without esophagitis   Chronic pain   Hyperammonemia (HCC)   Chronic obstructive pulmonary disease (COPD) ([place])    Plan: The patient has decompensated liver cirrhosis and acute alcohol hepatitis with a recent Tylenol overdose and the patient is presently getting N-acetylcysteine.  As per Dr. Verlin Grills note after 48 hours of antibiotics patient can be started on prednisone 40 mg daily for alcoholic hepatitis.  I  would continue to recommend supportive measures for this patient who has advanced decompensated liver disease with ascites and a poor prognosis.   LOS: 3 days   Lewayne Bunting 01/18/2022, 2:58 PM Pager [phone] 7am-5pm  Check AMION for 5pm -7am coverage and on weekends

## 2022-01-18 NOTE — Progress Notes (Signed)
PROGRESS NOTE    Rhonda Martin  ZWC:[id number] DOB: [birth year] DOA: 01/15/2022 PCP: Jon Billings, NP    Assessment & Plan:   Active Problems:   Sepsis ([place])   Spontaneous bacterial peritonitis (HCC)   Hyperammonemia (HCC)   Hypokalemia   GERD without esophagitis   Chronic pain   Chronic obstructive pulmonary disease (COPD) (HCC)  Assessment and Plan: Severe sepsis: met criteria w/ tachycardia, tachypnea, elevated lactic acid & possible SBP. Continue on IV abxs. 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 MELD score 27. Last drink of alcohol was 01/15/22. Hx of HBV/HCV. GI following and recs apprec  Possible spontaneous bacterial peritonitis: IR consulted for paracentesis but not enough ascites present as per IR so unable to do. Continue on IV rocephin, flagyl   Tylenol use in setting of cirrhosis: completed NAC as per GI    Hyperammonemia: continue on lactulose    Hyperbilirubinemia: likely secondary to alcoholic cirrhosis. Still elevated but continues to trend down. GI following   R/o liver mass: MRI abd shows advanced hepatic cirrhosis w/ diffusely heterogenous steatosis favored to reflect hepatic fibrosis w/ multiple regenerating nodules. No enhancing lesions identified to suggest hepatocellular carcinoma    Thrombocytopenia: likely secondary to alcohol abuse & cirrhosis   Hypomagnesemia: WNL today   Hypocalcemia: given Ca given today. Correct Ca is 8.1   Hx of right AKA: continue w/ supportive care    GERD: continue on PPI    Hypokalemia: KCl repleated   Chronic pain: continue on home buprenorphine patch   COPD exacerbation: continue bronchodilators         DVT prophylaxis: SCDs Code Status: full  Family Communication:  Disposition Plan: likely d/c back home   Level of care: Telemetry Medical  Status is: Inpatient Remains inpatient appropriate because: severity of illness   Consultants:  GI   Procedures:    Antimicrobials: rocephin, flagyl   Subjective: Pt still c/o abd pain   Objective: Vitals:   01/17/22 1824 01/17/22 1954 01/18/22 0558 01/18/22 0735  BP: (!) 141/70 136/75 112/78 125/73  Pulse: (!) 109 (!) 102 (!) 109 (!) 114  Resp: 20 18 20 19   Temp: 98.9 F (37.2 C) 98.4 F (36.9 C) 98.9 F (37.2 C) 98.6 F (37 C)  TempSrc: Oral     SpO2: 96% 92% 90% 92%  Weight:      Height:        Intake/Output Summary (Last 24 hours) at 01/18/2022 0755 Last data filed at 01/18/2022 0330 Gross per 24 hour  Intake 324.66 ml  Output --  Net 324.66 ml   Filed Weights   01/15/22 1726  Weight: 68 kg    Examination:  General exam: Appears uncomfortable. Jaundice  Respiratory system: clear breath sounds b/l. No rales Cardiovascular system: S1 & S2+. No rubs or clicks  Gastrointestinal system: Abd is soft, tenderness to palpation & normal bowel sounds  Central nervous system: alert and oriented. Moves all extremities  Psychiatry: judgement and insight appears normal. Flat mood and affect    Data Reviewed: I have personally reviewed following labs and imaging studies  CBC: Recent Labs  Lab 01/15/22 1734 01/17/22 0506 01/18/22 0342  WBC 6.9 5.1 5.5  HGB 13.7 11.6* 12.0  HCT 38.6 31.4* 33.4*  MCV 98.2 97.2 99.4  PLT 84* 70* 68*   Basic Metabolic Panel: Recent Labs  Lab 01/15/22 1734 01/16/22 0521 01/17/22 0506 01/17/22 2113 01/18/22 0342  NA 132* 133* 132*  --  134*  K 2.6* 3.5 2.7* 3.1* 2.8*  CL 91* 97* 95*  --  99  CO2 25 29 28   --  28  GLUCOSE 112* 115* 137*  --  140*  BUN <5* <5* <5*  --  <5*  CREATININE 0.33* 0.33* <0.30*  --  <0.30*  CALCIUM 7.4* 6.7* 6.7*  --  6.4*  MG  --  1.4* 2.4  --   --    GFR: CrCl cannot be calculated (This lab value cannot be used to calculate CrCl because it is not a number: <0.30). Liver Function Tests: Recent Labs  Lab 01/15/22 1734 01/16/22 0521 01/17/22 0506 01/18/22 0342  AST 173* 140* 108* 97*  ALT 61* 48* 45* 36   ALKPHOS 122 103 86 87  BILITOT 20.5* 18.2* 17.8* 16.9*  PROT 7.2 6.2* 6.0* 5.7*  ALBUMIN 2.5* 2.2* 2.0* 1.9*   Recent Labs  Lab 01/15/22 1734  LIPASE 32   Recent Labs  Lab 01/15/22 1734 01/16/22 0521 01/17/22 0506 01/18/22 0342  AMMONIA 67* 82* 83* 78*   Coagulation Profile: Recent Labs  Lab 01/16/22 0521  INR 1.8*   Cardiac Enzymes: No results for input(s): "CKTOTAL", "CKMB", "CKMBINDEX", "TROPONINI" in the last 168 hours. BNP (last 3 results) No results for input(s): "PROBNP" in the last 8760 hours. HbA1C: No results for input(s): "HGBA1C" in the last 72 hours. CBG: No results for input(s): "GLUCAP" in the last 168 hours. Lipid Profile: No results for input(s): "CHOL", "HDL", "LDLCALC", "TRIG", "CHOLHDL", "LDLDIRECT" in the last 72 hours. Thyroid Function Tests: No results for input(s): "TSH", "T4TOTAL", "FREET4", "T3FREE", "THYROIDAB" in the last 72 hours. Anemia Panel: No results for input(s): "VITAMINB12", "FOLATE", "FERRITIN", "TIBC", "IRON", "RETICCTPCT" in the last 72 hours. Sepsis Labs: Recent Labs  Lab 01/15/22 1734 01/15/22 2000 01/16/22 0521  PROCALCITON  --   --  0.31  LATICACIDVEN 6.5* 5.1*  --     Recent Results (from the past 240 hour(s))  Blood culture (routine x 2)     Status: None (Preliminary result)   Collection Time: 01/15/22  6:36 PM   Specimen: BLOOD LEFT ARM  Result Value Ref Range Status   Specimen Description BLOOD LEFT ARM  Final   Special Requests   Final    BOTTLES DRAWN AEROBIC AND ANAEROBIC Blood Culture adequate volume   Culture   Final    NO GROWTH 3 DAYS Performed at [facility], [address], [place], [place] [address]    Report Status PENDING  Incomplete  Blood culture (routine x 2)     Status: None (Preliminary result)   Collection Time: 01/15/22  6:36 PM   Specimen: Left Antecubital; Blood  Result Value Ref Range Status   Specimen Description LEFT ANTECUBITAL  Final   Special Requests   Final     BOTTLES DRAWN AEROBIC AND ANAEROBIC Blood Culture results may not be optimal due to an excessive volume of blood received in culture bottles   Culture   Final    NO GROWTH 3 DAYS Performed at [facility], [address] [place], [place] [address]    Report Status PENDING  Incomplete  SARS Coronavirus 2 by RT PCR (hospital order, performed in [facility] hospital lab) *cepheid single result test* Anterior Nasal Swab     Status: None   Collection Time: 01/15/22  8:00 PM   Specimen: Anterior Nasal Swab  Result Value Ref Range Status   SARS Coronavirus 2 by RT PCR NEGATIVE NEGATIVE Final  Comment: Performed at [facility], [address], [place], [place] [address]         Radiology Studies: MR ABDOMEN W WO CONTRAST  Result Date: 01/16/2022 CLINICAL DATA:  Liver failure.  Cirrhosis. EXAM: MRI ABDOMEN WITHOUT AND WITH CONTRAST TECHNIQUE: Multiplanar multisequence MR imaging of the abdomen was performed both before and after the administration of intravenous contrast. CONTRAST:  7.42m GADAVIST GADOBUTROL 1 MMOL/ML IV SOLN COMPARISON:  Abdominal ultrasound 01/16/2022. Abdominopelvic CT 01/15/2022 and 10/01/2021. FINDINGS: Lower chest: No significant findings are demonstrated at the lung bases. Hepatobiliary: As previously demonstrated, the liver is grossly abnormal with changes of advanced hepatic cirrhosis. There is diffuse contour irregularity of the liver with relative enlargement of the left and caudate lobes. There is heterogeneous loss of signal on the gradient echo opposed phase and FISP images, consistent with heterogeneous steatosis. There is also mild T2 heterogeneity but no significant restricted diffusion. Following contrast, there is diffuse heterogeneous enhancement of the liver, but no arterial phase enhancing lesions are identified. Appearance favors confluent hepatic fibrosis with multiple regenerating nodules. Possible small dependent gallstones. No  gallbladder wall thickening or biliary dilatation. The portal and hepatic veins are patent. Recanalized left paraumbilical vein noted. Pancreas: Unremarkable. No pancreatic ductal dilatation or surrounding inflammatory changes. Spleen: Measures 15.0 x 7.8 x 11.0 cm (volume = 670 cm^3), consistent with mild to moderate splenomegaly. No focal abnormality. Adrenals/Urinary Tract: Both adrenal glands appear normal. Both kidneys appear unremarkable. No evidence of renal mass or hydronephrosis. Bladder not imaged. Stomach/Bowel: Mild generalized bowel wall thickening, attributed to liver disease and ascites. No significant bowel distension or focal abnormality identified. Vascular/Lymphatic: There are no enlarged abdominal lymph nodes. Patent hepatic and portal veins. The IVC is patent. Recanalized left paraumbilical vein with scattered mesenteric and abdominal wall collaterals. Other: Small to moderate amount of ascites without abnormal enhancement. Generalized mesenteric edema. Musculoskeletal: Numerous grossly stable thoracolumbar compression deformities. No acute osseous findings are evident. IMPRESSION: 1. Changes of advanced hepatic cirrhosis with diffusely heterogeneous steatosis and enhancement, favored to reflect confluent hepatic fibrosis with multiple regenerating nodules. No focal arterial phase enhancing lesions identified to suggest infiltrative hepatocellular carcinoma. 2. Mild portal hypertension with mild splenomegaly and portosystemic collaterals. 3. Small to moderate amount of ascites. Electronically Signed   By: WRichardean SaleM.D.   On: 01/16/2022 14:34   UKoreaAbdomen Limited  Result Date: 01/16/2022 CLINICAL DATA:  Ascites EXAM: LIMITED ABDOMEN ULTRASOUND FOR ASCITES TECHNIQUE: Limited ultrasound survey for ascites was performed in all four abdominal quadrants. COMPARISON:  None Available. FINDINGS: Limited sonographic exam of the 4 quadrants of the abdomen was performed for fluid assessment.  Images demonstrate no ascites. No intervention performed. IMPRESSION: No ascites.  No paracentesis performed. Electronically Signed   By: YAlbin FellingM.D.   On: 01/16/2022 13:38        Scheduled Meds:  buprenorphine  1 patch Transdermal Weekly   lactulose  30 g Oral BID   lidocaine  1 patch Transdermal Q24H   mometasone-formoterol  2 puff Inhalation BID   multivitamin with minerals  1 tablet Oral Daily   pantoprazole  40 mg Oral Daily   potassium chloride  40 mEq Oral BID   Continuous Infusions:  calcium gluconate     cefTRIAXone (ROCEPHIN)  IV 2 g (01/17/22 1817)   metronidazole 500 mg (01/17/22 2129)     LOS: 3 days    Time spent: 30 mins     JWyvonnia Dusky MD [facility] Pager  [phone]  If 7PM-7AM, please contact night-coverage [email or web address] 01/18/2022, 7:55 AM

## 2022-01-18 NOTE — Progress Notes (Signed)
Cross Cover Critical calcium level reported 6.4. Corrected calcium for albumin of 1.9 is 8.1.  No supplementation needed

## 2022-01-18 NOTE — Progress Notes (Signed)
Lab called to inform critical value for Calcium at 6.4. NP paged and notified.

## 2022-01-18 NOTE — Plan of Care (Signed)

## 2022-01-19 DIAGNOSIS — K7031 Alcoholic cirrhosis of liver with ascites: Secondary | ICD-10-CM | POA: Diagnosis not present

## 2022-01-19 DIAGNOSIS — E7220 Disorder of urea cycle metabolism, unspecified: Secondary | ICD-10-CM | POA: Diagnosis not present

## 2022-01-19 DIAGNOSIS — K652 Spontaneous bacterial peritonitis: Secondary | ICD-10-CM | POA: Diagnosis not present

## 2022-01-19 LAB — CBC
HCT: 35.5 % — ABNORMAL LOW (ref 36.0–46.0)
Hemoglobin: 12.6 g/dL (ref 12.0–15.0)
MCH: 35.8 pg — ABNORMAL HIGH (ref 26.0–34.0)
MCHC: 35.5 g/dL (ref 30.0–36.0)
MCV: 100.9 fL — ABNORMAL HIGH (ref 80.0–100.0)
Platelets: 74 10*3/uL — ABNORMAL LOW (ref 150–400)
RBC: 3.52 MIL/uL — ABNORMAL LOW (ref 3.87–5.11)
RDW: 22.8 % — ABNORMAL HIGH (ref 11.5–15.5)
WBC: 6.4 10*3/uL (ref 4.0–10.5)
nRBC: 0 % (ref 0.0–0.2)

## 2022-01-19 LAB — COMPREHENSIVE METABOLIC PANEL
ALT: 37 U/L (ref 0–44)
AST: 100 U/L — ABNORMAL HIGH (ref 15–41)
Albumin: 2 g/dL — ABNORMAL LOW (ref 3.5–5.0)
Alkaline Phosphatase: 101 U/L (ref 38–126)
Anion gap: 6 (ref 5–15)
BUN: 6 mg/dL (ref 6–20)
CO2: 27 mmol/L (ref 22–32)
Calcium: 6.6 mg/dL — ABNORMAL LOW (ref 8.9–10.3)
Chloride: 100 mmol/L (ref 98–111)
Creatinine, Ser: <0.3 mg/dL — ABNORMAL LOW (ref 0.44–1.00)
Glucose, Bld: 120 mg/dL — ABNORMAL HIGH (ref 70–99)
Potassium: 3.3 mmol/L — ABNORMAL LOW (ref 3.5–5.1)
Sodium: 133 mmol/L — ABNORMAL LOW (ref 135–145)
Total Bilirubin: 15.4 mg/dL — ABNORMAL HIGH (ref 0.3–1.2)
Total Protein: 6 g/dL — ABNORMAL LOW (ref 6.5–8.1)

## 2022-01-19 LAB — AMMONIA: Ammonia: 107 umol/L — ABNORMAL HIGH (ref 9–35)

## 2022-01-19 MED ORDER — POLYETHYLENE GLYCOL 3350 17 G PO PACK
17.0000 g | PACK | Freq: Every day | ORAL | Status: DC
  Administered 2022-01-20 – 2022-01-22 (×3): 17 g via ORAL
  Filled 2022-01-19 (×3): qty 1

## 2022-01-19 MED ORDER — POTASSIUM CHLORIDE CRYS ER 20 MEQ PO TBCR
40.0000 meq | EXTENDED_RELEASE_TABLET | Freq: Two times a day (BID) | ORAL | Status: AC
  Administered 2022-01-19 (×2): 40 meq via ORAL
  Filled 2022-01-19 (×2): qty 2

## 2022-01-19 NOTE — Progress Notes (Signed)
GI Inpatient Follow-up Note  Subjective:  Patient seen and doing better. Able to move some.  Scheduled Inpatient Medications:   buprenorphine  1 patch Transdermal Weekly   lidocaine  1 patch Transdermal Q24H   metoprolol tartrate  12.5 mg Oral BID   mometasone-formoterol  2 puff Inhalation BID   multivitamin with minerals  1 tablet Oral Daily   pantoprazole  40 mg Oral Daily   polyethylene glycol  17 g Oral Daily   potassium chloride  40 mEq Oral BID    Continuous Inpatient Infusions:    cefTRIAXone (ROCEPHIN)  IV 2 g (01/18/22 1831)   metronidazole 500 mg (01/19/22 1027)    PRN Inpatient Medications:  ALPRAZolam, gabapentin, hydrocortisone, HYDROmorphone (DILAUDID) injection, ipratropium-albuterol, magnesium hydroxide, metoCLOPramide (REGLAN) injection, oxyCODONE, traZODone  Review of Systems:  Review of Systems  Constitutional:  Positive for fever. Negative for chills.  Respiratory:  Negative for cough and shortness of breath.   Cardiovascular:  Negative for chest pain.  Gastrointestinal:  Positive for diarrhea and nausea. Negative for abdominal pain, blood in stool, melena and vomiting.  Musculoskeletal:  Positive for joint pain.  Skin:  Negative for rash.  Neurological:  Negative for focal weakness.  Psychiatric/Behavioral:  Positive for substance abuse.       Physical Examination: BP 123/66 (BP Location: Right Arm)   Pulse 99   Temp 98.2 F (36.8 C)   Resp (!) 22   Ht '5\' 2"'$  (1.575 m)   Wt 68 kg   LMP 01/14/2022 (Approximate)   SpO2 93%   BMI 27.44 kg/m  Gen: NAD, alert and oriented x 4 HEENT: Mild scleral icterus Neck: supple, no JVD or thyromegaly Chest: No respiratory distress CV: RRR, no m/g/c/r Abd: distended, tympanic Ext: no edema, well perfused with 2+ pulses, Skin: no rash or lesions noted Lymph: no LAD  Data: Lab Results  Component Value Date   WBC 6.4 01/19/2022   HGB 12.6 01/19/2022   HCT 35.5 (L) 01/19/2022   MCV 100.9 (H) 01/19/2022    PLT 74 (L) 01/19/2022   Recent Labs  Lab 01/17/22 0506 01/18/22 0342 01/19/22 0342  HGB 11.6* 12.0 12.6   Lab Results  Component Value Date   NA 133 (L) 01/19/2022   K 3.3 (L) 01/19/2022   CL 100 01/19/2022   CO2 27 01/19/2022   BUN 6 01/19/2022   CREATININE <0.30 (L) 01/19/2022   Lab Results  Component Value Date   ALT 37 01/19/2022   AST 100 (H) 01/19/2022   GGT 92 (H) 07/14/2021   ALKPHOS 101 01/19/2022   BILITOT 15.4 (H) 01/19/2022   Recent Labs  Lab 01/16/22 0521  INR 1.8*   Assessment/Plan: Ms. Illingworth is a 46 y.o. lady with alcohol cirrhosis who presented with acute on chronic liver failure likely secondary to alcoholic hepatitis in setting of alcohol and tylenol use. S/p NAC therapy  Recommendations:  - will d/c trending ammonia as this is not clinically useful - will d/c lactulose due to abdominal distension and switch to miralax - continue antibiotics for total of 5-7 days - encouraged nutrition - labs improving, would hold off on any steroids at this time due to question of infection - avoid opioid pan medicines if possible - PT/OT - dispo planning  Will continue to follow, please call with any questions or concerns.  Raylene Miyamoto MD, MPH [place]

## 2022-01-19 NOTE — Progress Notes (Signed)
PROGRESS NOTE    Rhonda Martin  JQB:[id number] DOB: 03-24-1976 DOA: 01/15/2022 PCP: Jon Billings, NP    Assessment & Plan:   Active Problems:   Sepsis ([place])   Spontaneous bacterial peritonitis (HCC)   Hyperammonemia (HCC)   Hypokalemia   GERD without esophagitis   Chronic pain   Chronic obstructive pulmonary disease (COPD) (HCC)   Alcoholic cirrhosis of liver with ascites (HCC)  Assessment and Plan: Severe sepsis: met criteria w/ tachycardia, tachypnea, elevated lactic acid & possible SBP. Continue on IV abxs. 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 MELD score 27. Last drink of alcohol was 01/15/22. Hx of HBV/HCV. GI following and recs apprec  Possible spontaneous bacterial peritonitis: IR consulted for paracentesis but not enough ascites present as per IR so unable to do. Continue on IV flagyl, rocephin   Tylenol use in setting of cirrhosis: completed NAC as per GI    Hyperammonemia: continue on lactulose    Hyperbilirubinemia: likely secondary to alcoholic cirrhosis. Still elevated but is trending down from day prior   Still elevated but continues to trend down. GI following   R/o liver mass: MRI abd shows advanced hepatic cirrhosis w/ diffusely heterogenous steatosis favored to reflect hepatic fibrosis w/ multiple regenerating nodules. No enhancing lesions identified to suggest hepatocellular carcinoma   Thrombocytopenia: likely secondary to alcohol abuse & cirrhosis   Hypomagnesemia: WNL today   Hypocalcemia: correct Ca 8.2 today   Hx of right AKA: continue w/ supportive care    GERD: continue on PPI    Hypokalemia: potassium ordered    Chronic pain: continue on home buprenorphine patch   COPD exacerbation: continue on bronchodilators         DVT prophylaxis: SCDs Code Status: full  Family Communication:  Disposition Plan: likely d/c back home   Level of care: Telemetry Medical  Status is: Inpatient Remains  inpatient appropriate because: severity of illness   Consultants:  GI   Procedures:   Antimicrobials: rocephin, flagyl   Subjective: Rhonda Martin c/o malaise and abd pain   Objective: Vitals:   01/18/22 2100 01/19/22 0414 01/19/22 0740 01/19/22 0802  BP: 115/66 104/65 101/65   Pulse: (!) 101 (!) 107 95   Resp: 20 18 17 17   Temp: 99 F (37.2 C) 98.4 F (36.9 C) 98.6 F (37 C)   TempSrc:      SpO2: (!) 89% 93% 90%   Weight:      Height:        Intake/Output Summary (Last 24 hours) at 01/19/2022 0812 Last data filed at 01/19/2022 0738 Gross per 24 hour  Intake 200 ml  Output 300 ml  Net -100 ml   Filed Weights   01/15/22 1726  Weight: 68 kg    Examination:  General exam: Appears calm but uncomfortable. Jaundice & scleral icterus  Respiratory system: clear breath sounds b/l. No rales, wheezes Cardiovascular system: S1/S2+. No runs or gallops  Gastrointestinal system: Abd is soft, tenderness to palpation, distended & normal bowel sounds Central nervous system: Alert and oriented. Moves all extremities  Psychiatry: judgement and insight appears normal. Flat mood and affect     Data Reviewed: I have personally reviewed following labs and imaging studies  CBC: Recent Labs  Lab 01/15/22 1734 01/17/22 0506 01/18/22 0342 01/19/22 0342  WBC 6.9 5.1 5.5 6.4  HGB 13.7 11.6* 12.0 12.6  HCT 38.6 31.4* 33.4* 35.5*  MCV 98.2 97.2 99.4 100.9*  PLT 84* 70* 68* 74*  Basic Metabolic Panel: Recent Labs  Lab 01/15/22 1734 01/16/22 0521 01/17/22 0506 01/17/22 2113 01/18/22 0342 01/19/22 0342  NA 132* 133* 132*  --  134* 133*  K 2.6* 3.5 2.7* 3.1* 2.8* 3.3*  CL 91* 97* 95*  --  99 100  CO2 25 29 28   --  28 27  GLUCOSE 112* 115* 137*  --  140* 120*  BUN <5* <5* <5*  --  <5* 6  CREATININE 0.33* 0.33* <0.30*  --  <0.30* <0.30*  CALCIUM 7.4* 6.7* 6.7*  --  6.4* 6.6*  MG  --  1.4* 2.4  --   --   --    GFR: CrCl cannot be calculated (This lab value cannot be used to  calculate CrCl because it is not a number: <0.30). Liver Function Tests: Recent Labs  Lab 01/15/22 1734 01/16/22 0521 01/17/22 0506 01/18/22 0342 01/19/22 0342  AST 173* 140* 108* 97* 100*  ALT 61* 48* 45* 36 37  ALKPHOS 122 103 86 87 101  BILITOT 20.5* 18.2* 17.8* 16.9* 15.4*  PROT 7.2 6.2* 6.0* 5.7* 6.0*  ALBUMIN 2.5* 2.2* 2.0* 1.9* 2.0*   Recent Labs  Lab 01/15/22 1734  LIPASE 32   Recent Labs  Lab 01/15/22 1734 01/16/22 0521 01/17/22 0506 01/18/22 0342 01/19/22 0342  AMMONIA 67* 82* 83* 78* 107*   Coagulation Profile: Recent Labs  Lab 01/16/22 0521  INR 1.8*   Cardiac Enzymes: No results for input(s): "CKTOTAL", "CKMB", "CKMBINDEX", "TROPONINI" in the last 168 hours. BNP (last 3 results) No results for input(s): "PROBNP" in the last 8760 hours. HbA1C: No results for input(s): "HGBA1C" in the last 72 hours. CBG: No results for input(s): "GLUCAP" in the last 168 hours. Lipid Profile: No results for input(s): "CHOL", "HDL", "LDLCALC", "TRIG", "CHOLHDL", "LDLDIRECT" in the last 72 hours. Thyroid Function Tests: No results for input(s): "TSH", "T4TOTAL", "FREET4", "T3FREE", "THYROIDAB" in the last 72 hours. Anemia Panel: No results for input(s): "VITAMINB12", "FOLATE", "FERRITIN", "TIBC", "IRON", "RETICCTPCT" in the last 72 hours. Sepsis Labs: Recent Labs  Lab 01/15/22 1734 01/15/22 2000 01/16/22 0521  PROCALCITON  --   --  0.31  LATICACIDVEN 6.5* 5.1*  --     Recent Results (from the past 240 hour(s))  Blood culture (routine x 2)     Status: None (Preliminary result)   Collection Time: 01/15/22  6:36 PM   Specimen: BLOOD LEFT ARM  Result Value Ref Range Status   Specimen Description BLOOD LEFT ARM  Final   Special Requests   Final    BOTTLES DRAWN AEROBIC AND ANAEROBIC Blood Culture adequate volume   Culture   Final    NO GROWTH 4 DAYS Performed at [facility], [address], [place],  [address]    Report Status PENDING   Incomplete  Blood culture (routine x 2)     Status: None (Preliminary result)   Collection Time: 01/15/22  6:36 PM   Specimen: Left Antecubital; Blood  Result Value Ref Range Status   Specimen Description LEFT ANTECUBITAL  Final   Special Requests   Final    BOTTLES DRAWN AEROBIC AND ANAEROBIC Blood Culture results may not be optimal due to an excessive volume of blood received in culture bottles   Culture   Final    NO GROWTH 4 DAYS Performed at [facility], [address], [place],  [address]    Report Status PENDING  Incomplete  SARS Coronavirus 2 by RT PCR (hospital order, performed in  [facility] hospital lab) *cepheid single result test* Anterior Nasal Swab     Status: None   Collection Time: 01/15/22  8:00 PM   Specimen: Anterior Nasal Swab  Result Value Ref Range Status   SARS Coronavirus 2 by RT PCR NEGATIVE NEGATIVE Final    Comment: Performed at [facility], [address] [place], [place] [address]         Radiology Studies: No results found.      Scheduled Meds:  buprenorphine  1 patch Transdermal Weekly   lactulose  30 g Oral BID   lidocaine  1 patch Transdermal Q24H   metoprolol tartrate  12.5 mg Oral BID   mometasone-formoterol  2 puff Inhalation BID   multivitamin with minerals  1 tablet Oral Daily   pantoprazole  40 mg Oral Daily   Continuous Infusions:  cefTRIAXone (ROCEPHIN)  IV 2 g (01/18/22 1831)   metronidazole Stopped (01/18/22 2240)     LOS: 4 days    Time spent: 30 mins     Wyvonnia Dusky, MD [facility] Pager [phone]  If 7PM-7AM, please contact night-coverage [email or web address] 01/19/2022, 8:12 AM

## 2022-01-20 DIAGNOSIS — K7031 Alcoholic cirrhosis of liver with ascites: Secondary | ICD-10-CM | POA: Diagnosis not present

## 2022-01-20 DIAGNOSIS — E7220 Disorder of urea cycle metabolism, unspecified: Secondary | ICD-10-CM | POA: Diagnosis not present

## 2022-01-20 DIAGNOSIS — K652 Spontaneous bacterial peritonitis: Secondary | ICD-10-CM | POA: Diagnosis not present

## 2022-01-20 LAB — COMPREHENSIVE METABOLIC PANEL
ALT: 34 U/L (ref 0–44)
AST: 93 U/L — ABNORMAL HIGH (ref 15–41)
Albumin: 2 g/dL — ABNORMAL LOW (ref 3.5–5.0)
Alkaline Phosphatase: 100 U/L (ref 38–126)
Anion gap: 8 (ref 5–15)
BUN: 5 mg/dL — ABNORMAL LOW (ref 6–20)
CO2: 23 mmol/L (ref 22–32)
Calcium: 6.6 mg/dL — ABNORMAL LOW (ref 8.9–10.3)
Chloride: 100 mmol/L (ref 98–111)
Creatinine, Ser: <0.3 mg/dL — ABNORMAL LOW (ref 0.44–1.00)
Glucose, Bld: 99 mg/dL (ref 70–99)
Potassium: 3.6 mmol/L (ref 3.5–5.1)
Sodium: 131 mmol/L — ABNORMAL LOW (ref 135–145)
Total Bilirubin: 16.5 mg/dL — ABNORMAL HIGH (ref 0.3–1.2)
Total Protein: 5.9 g/dL — ABNORMAL LOW (ref 6.5–8.1)

## 2022-01-20 LAB — CULTURE, BLOOD (ROUTINE X 2)
Culture: NO GROWTH
Culture: NO GROWTH
Special Requests: ADEQUATE

## 2022-01-20 LAB — CBC
HCT: 34.4 % — ABNORMAL LOW (ref 36.0–46.0)
Hemoglobin: 12 g/dL (ref 12.0–15.0)
MCH: 35.4 pg — ABNORMAL HIGH (ref 26.0–34.0)
MCHC: 34.9 g/dL (ref 30.0–36.0)
MCV: 101.5 fL — ABNORMAL HIGH (ref 80.0–100.0)
Platelets: 80 10*3/uL — ABNORMAL LOW (ref 150–400)
RBC: 3.39 MIL/uL — ABNORMAL LOW (ref 3.87–5.11)
RDW: 22.3 % — ABNORMAL HIGH (ref 11.5–15.5)
WBC: 6.8 10*3/uL (ref 4.0–10.5)
nRBC: 0 % (ref 0.0–0.2)

## 2022-01-20 MED ORDER — SIMETHICONE 80 MG PO CHEW: 80.0000 mg | CHEWABLE_TABLET | Freq: Four times a day (QID) | ORAL | Status: DC | PRN

## 2022-01-20 NOTE — Plan of Care (Signed)

## 2022-01-20 NOTE — Progress Notes (Signed)
PROGRESS NOTE   HPI was taken from Dr. Sidney Ace: Rhonda Martin is a 46 y.o. female with medical history significant for alcohol liver cirrhosis, hypertension, COPD, GERD, right AKA, who presented to the ER with acute onset of generalized abdominal pain mainly in the left upper and lower quadrant with associated nausea and vomiting as well as distention.  She admitted to diarrhea that has been getting better with Imodium.  She also admits to tactile fever and chills.  No chest pain or dyspnea or cough or wheezing.  No melena or bright red bleeding per rectum.  No bilious vomitus or hematemesis.   ED Course: When she came to the ER, heart rate was 120 with respiratory rate of 22 and otherwise normal vital signs.  Later on respiratory rate was 21.  Labs revealed hypokalemia of 2.6 with hyponatremia 132 and hypochloremia of 91 calcium of 7.4 with albumin of 2.5 and anion gap 16.  Ammonia level was 67 ALT 61 AST 173 with total bili of 20.5.  Lactic acid was 6.5 and later 5.1.  CBC showed thrombocytopenia of 84.  Urine pregnancy test was negative and UA was unremarkable EKG as reviewed by me : EKG showed sinus rhythm with a rate of 89 with low voltage QRS Imaging: Portable chest ray showed increased diffuse interstitial pattern consistent with edema. Abdominal pelvic CT scan showed the following 1. Cirrhosis with portal hypertension. Diffusely heterogeneous hepatic parenchyma with query underlying mass lesions concerning for malignancy. When the patient is clinically stable and able to follow directions and hold their breath (preferably as an outpatient) further evaluation with dedicated abdominal MRI liver protocol should be considered. 2. Small volume simple free fluid ascites. 3. Cholelithiasis with no CT finding of acute cholecystitis. 4.  Aortic Atherosclerosis (ICD10-I70.0).  The patient was given 2 g of IV Rocephin, 20 g of p.o. lactulose and 1 L bolus of IV normal saline.  IR paracentesis was  ordered.  She will be admitted to a medical telemetry bed for further evaluation and management.   As per Dr. Jimmye Norman 8/11-8/15/23: Pt presented w/ severe sepsis possibly secondary to SBP. Pt has been on IV flagyl, rocephin since admission. Of note, pt also have alcoholic cirrhosis and pt continues to drink w/ last drink being 01/15/22. No signs/symptoms of w/drawl currently. Pt did receive NAC for possible tylenol toxicity this admission as per GI. Pt continues to have abd pain. MRI abd : MRI abd shows advanced hepatic cirrhosis w/ diffusely heterogenous steatosis favored to reflect hepatic fibrosis w/ multiple regenerating nodules. No enhancing lesions identified to suggest hepatocellular carcinoma. GI is following.     Rhonda Martin  MDY:[id number] DOB: 08-31-1975 DOA: 01/15/2022 PCP: Jon Billings, NP    Assessment & Plan:   Active Problems:   Sepsis ([place])   Spontaneous bacterial peritonitis (HCC)   Hyperammonemia (HCC)   Hypokalemia   GERD without esophagitis   Chronic pain   Chronic obstructive pulmonary disease (COPD) (HCC)   Alcoholic cirrhosis of liver with ascites (HCC)  Assessment and Plan: Severe sepsis: met criteria w/ tachycardia, tachypnea, elevated lactic acid & possible SBP. Continue on IV abxs. Severe sepsis resolved    Alcoholic cirrhosis: MELD score 27. Last drink of alcohol was 01/15/22. Hx of HBV/HCV. GI following and recs apprec   Possible spontaneous bacterial peritonitis: IR consulted for paracentesis but not enough ascites present as per IR so unable to do. Continue on rocephin, flagyl   Tylenol use in setting of cirrhosis: completed NAC  as per GI    Hyperammonemia: d/c lactulose & start miralax as per GI     Hyperbilirubinemia: likely secondary to alcoholic cirrhosis. Labile   R/o liver mass: MRI abd shows advanced hepatic cirrhosis w/ diffusely heterogenous steatosis favored to reflect hepatic fibrosis w/ multiple regenerating nodules. No enhancing  lesions identified to suggest hepatocellular carcinoma   Thrombocytopenia: likely secondary to alcohol use & cirrhosis   Hypomagnesemia: WNL today   Hypocalcemia: corrected Ca 8.2  Hx of right AKA: continue w/ supportive care    GERD: continue on PPI    Hypokalemia: WNL today    Chronic pain: continue on home buprenorphine patch   COPD exacerbation: continue on bronchodilators          DVT prophylaxis: SCDs Code Status: full  Family Communication:  Disposition Plan: likely d/c back home   Level of care: Telemetry Medical  Status is: Inpatient Remains inpatient appropriate because: severity of illness   Consultants:  GI   Procedures:   Antimicrobials: rocephin, flagyl   Subjective: Pt c/o abd pain   Objective: Vitals:   01/19/22 1824 01/19/22 1956 01/20/22 0428 01/20/22 0545  BP:  110/71 97/67 109/63  Pulse:  97 91 92  Resp: 20 16 18    Temp:  99.7 F (37.6 C) 98.2 F (36.8 C)   TempSrc:  Oral Oral   SpO2:  92% 91%   Weight:      Height:       No intake or output data in the 24 hours ending 01/20/22 0810  Filed Weights   01/15/22 1726  Weight: 68 kg    Examination:  General exam: Appears uncomfortable. Jaundice & scleral icterus  Respiratory system: clear breath sounds b/l  Cardiovascular system: S1 & S2+. No rubs or clicks Gastrointestinal system: Abd is soft, tenderness to palpation, distended, hyperactive bowel sounds Central nervous system: Alert and oriented. Moves all extremities  Psychiatry: judgement and insight appears normal. Flat mood and affect     Data Reviewed: I have personally reviewed following labs and imaging studies  CBC: Recent Labs  Lab 01/15/22 1734 01/17/22 0506 01/18/22 0342 01/19/22 0342 01/20/22 0427  WBC 6.9 5.1 5.5 6.4 6.8  HGB 13.7 11.6* 12.0 12.6 12.0  HCT 38.6 31.4* 33.4* 35.5* 34.4*  MCV 98.2 97.2 99.4 100.9* 101.5*  PLT 84* 70* 68* 74* 80*   Basic Metabolic Panel: Recent Labs  Lab  01/16/22 0521 01/17/22 0506 01/17/22 2113 01/18/22 0342 01/19/22 0342 01/20/22 0427  NA 133* 132*  --  134* 133* 131*  K 3.5 2.7* 3.1* 2.8* 3.3* 3.6  CL 97* 95*  --  99 100 100  CO2 29 28  --  28 27 23   GLUCOSE 115* 137*  --  140* 120* 99  BUN <5* <5*  --  <5* 6 5*  CREATININE 0.33* <0.30*  --  <0.30* <0.30* <0.30*  CALCIUM 6.7* 6.7*  --  6.4* 6.6* 6.6*  MG 1.4* 2.4  --   --   --   --    GFR: CrCl cannot be calculated (This lab value cannot be used to calculate CrCl because it is not a number: <0.30). Liver Function Tests: Recent Labs  Lab 01/16/22 0521 01/17/22 0506 01/18/22 0342 01/19/22 0342 01/20/22 0427  AST 140* 108* 97* 100* 93*  ALT 48* 45* 36 37 34  ALKPHOS 103 86 87 101 100  BILITOT 18.2* 17.8* 16.9* 15.4* 16.5*  PROT 6.2* 6.0* 5.7* 6.0* 5.9*  ALBUMIN 2.2* 2.0* 1.9* 2.0*  2.0*   Recent Labs  Lab 01/15/22 1734  LIPASE 32   Recent Labs  Lab 01/15/22 1734 01/16/22 0521 01/17/22 0506 01/18/22 0342 01/19/22 0342  AMMONIA 67* 82* 83* 78* 107*   Coagulation Profile: Recent Labs  Lab 01/16/22 0521  INR 1.8*   Cardiac Enzymes: No results for input(s): "CKTOTAL", "CKMB", "CKMBINDEX", "TROPONINI" in the last 168 hours. BNP (last 3 results) No results for input(s): "PROBNP" in the last 8760 hours. HbA1C: No results for input(s): "HGBA1C" in the last 72 hours. CBG: No results for input(s): "GLUCAP" in the last 168 hours. Lipid Profile: No results for input(s): "CHOL", "HDL", "LDLCALC", "TRIG", "CHOLHDL", "LDLDIRECT" in the last 72 hours. Thyroid Function Tests: No results for input(s): "TSH", "T4TOTAL", "FREET4", "T3FREE", "THYROIDAB" in the last 72 hours. Anemia Panel: No results for input(s): "VITAMINB12", "FOLATE", "FERRITIN", "TIBC", "IRON", "RETICCTPCT" in the last 72 hours. Sepsis Labs: Recent Labs  Lab 01/15/22 1734 01/15/22 2000 01/16/22 0521  PROCALCITON  --   --  0.31  LATICACIDVEN 6.5* 5.1*  --     Recent Results (from the past 240  hour(s))  Blood culture (routine x 2)     Status: None   Collection Time: 01/15/22  6:36 PM   Specimen: BLOOD LEFT ARM  Result Value Ref Range Status   Specimen Description BLOOD LEFT ARM  Final   Special Requests   Final    BOTTLES DRAWN AEROBIC AND ANAEROBIC Blood Culture adequate volume   Culture   Final    NO GROWTH 5 DAYS Performed at [facility], [address] [place], [place] [address]    Report Status 01/20/2022 FINAL  Final  Blood culture (routine x 2)     Status: None   Collection Time: 01/15/22  6:36 PM   Specimen: Left Antecubital; Blood  Result Value Ref Range Status   Specimen Description LEFT ANTECUBITAL  Final   Special Requests   Final    BOTTLES DRAWN AEROBIC AND ANAEROBIC Blood Culture results may not be optimal due to an excessive volume of blood received in culture bottles   Culture   Final    NO GROWTH 5 DAYS Performed at [facility], [address], [place], [place] [address]    Report Status 01/20/2022 FINAL  Final  SARS Coronavirus 2 by RT PCR (hospital order, performed in [facility] hospital lab) *cepheid single result test* Anterior Nasal Swab     Status: None   Collection Time: 01/15/22  8:00 PM   Specimen: Anterior Nasal Swab  Result Value Ref Range Status   SARS Coronavirus 2 by RT PCR NEGATIVE NEGATIVE Final    Comment: Performed at [facility], [address] [place], [place] [address]         Radiology Studies: No results found.      Scheduled Meds:  buprenorphine  1 patch Transdermal Weekly   lidocaine  1 patch Transdermal Q24H   metoprolol tartrate  12.5 mg Oral BID   mometasone-formoterol  2 puff Inhalation BID   multivitamin with minerals  1 tablet Oral Daily   pantoprazole  40 mg Oral Daily   polyethylene glycol  17 g Oral Daily   Continuous Infusions:  cefTRIAXone (ROCEPHIN)  IV 200 mL/hr at 01/19/22 1903   metronidazole 500 mg (01/19/22 2312)     LOS: 5 days    Time spent: 25 mins      Wyvonnia Dusky, MD [facility] Pager [phone]  If 7PM-7AM, please contact  night-coverage [email or web address] 01/20/2022, 8:10 AM

## 2022-01-20 NOTE — Progress Notes (Signed)
GI Inpatient Follow-up Note  Subjective:  Patient seen and complaining of abdominal pain. Abdomen is soft. Alert and oriented. Eating more food.  Scheduled Inpatient Medications:   buprenorphine  1 patch Transdermal Weekly   lidocaine  1 patch Transdermal Q24H   metoprolol tartrate  12.5 mg Oral BID   mometasone-formoterol  2 puff Inhalation BID   multivitamin with minerals  1 tablet Oral Daily   pantoprazole  40 mg Oral Daily   polyethylene glycol  17 g Oral Daily    Continuous Inpatient Infusions:    cefTRIAXone (ROCEPHIN)  IV 200 mL/hr at 01/19/22 1903   metronidazole 500 mg (01/20/22 1003)    PRN Inpatient Medications:  ALPRAZolam, gabapentin, hydrocortisone, HYDROmorphone (DILAUDID) injection, ipratropium-albuterol, magnesium hydroxide, metoCLOPramide (REGLAN) injection, oxyCODONE, simethicone, traZODone  Review of Systems:  Review of Systems  Constitutional:  Negative for chills and fever.  Respiratory:  Negative for shortness of breath.   Cardiovascular:  Negative for chest pain.  Gastrointestinal:  Positive for abdominal pain. Negative for blood in stool, constipation and diarrhea.  Musculoskeletal:  Positive for joint pain.  Skin:  Negative for rash.  Neurological:  Negative for focal weakness.  Psychiatric/Behavioral:  The patient is nervous/anxious.   All other systems reviewed and are negative.     Physical Examination: BP (!) 92/56 (BP Location: Left Arm)   Pulse 94   Temp 98.7 F (37.1 C)   Resp 17   Ht '5\' 2"'$  (1.575 m)   Wt 68 kg   LMP 01/14/2022 (Approximate)   SpO2 92%   BMI 27.44 kg/m  Gen: NAD, alert and oriented x 4 HEENT: Jaundiced Neck: supple, no JVD or thyromegaly Chest: No respiratory distress Abd: soft, distended, no guarding, ridigity, or rebound tenderness Ext: no edema, well perfused with 2+ pulses, Skin: no rash or lesions noted Lymph: no LAD  Data: Lab Results  Component Value Date   WBC 6.8 01/20/2022   HGB 12.0 01/20/2022    HCT 34.4 (L) 01/20/2022   MCV 101.5 (H) 01/20/2022   PLT 80 (L) 01/20/2022   Recent Labs  Lab 01/18/22 0342 01/19/22 0342 01/20/22 0427  HGB 12.0 12.6 12.0   Lab Results  Component Value Date   NA 131 (L) 01/20/2022   K 3.6 01/20/2022   CL 100 01/20/2022   CO2 23 01/20/2022   BUN 5 (L) 01/20/2022   CREATININE <0.30 (L) 01/20/2022   Lab Results  Component Value Date   ALT 34 01/20/2022   AST 93 (H) 01/20/2022   GGT 92 (H) 07/14/2021   ALKPHOS 100 01/20/2022   BILITOT 16.5 (H) 01/20/2022   Recent Labs  Lab 01/16/22 0521  INR 1.8*   Assessment/Plan: Ms. Potier is a 46 y.o. lady with alcohol cirrhosis who presented with acute on chronic liver failure likely secondary to alcoholic hepatitis in setting of alcohol and tylenol use. S/p NAC therapy  Recommendations:  - dispo planning, will need to start weaning off IV opioid pain medicines and avoid PO pain medicines if at all possible - continue antibiotics for total of 5-7 days - encouraged nutrition - would hold off on any steroids at this time due to question of infection. Can be started as an outpatient - avoid opioid pan medicines if possible - PT/OT    Will continue to follow, please call with any questions or concerns.  Raylene Miyamoto MD, MPH [place]

## 2022-01-21 ENCOUNTER — Institutional Professional Consult (permissible substitution): Payer: Medicaid Other | Admitting: Student in an Organized Health Care Education/Training Program

## 2022-01-21 DIAGNOSIS — J449 Chronic obstructive pulmonary disease, unspecified: Secondary | ICD-10-CM

## 2022-01-21 DIAGNOSIS — K652 Spontaneous bacterial peritonitis: Secondary | ICD-10-CM | POA: Diagnosis not present

## 2022-01-21 DIAGNOSIS — R6520 Severe sepsis without septic shock: Secondary | ICD-10-CM

## 2022-01-21 DIAGNOSIS — A419 Sepsis, unspecified organism: Secondary | ICD-10-CM | POA: Diagnosis not present

## 2022-01-21 DIAGNOSIS — K7031 Alcoholic cirrhosis of liver with ascites: Secondary | ICD-10-CM | POA: Diagnosis not present

## 2022-01-21 LAB — COMPREHENSIVE METABOLIC PANEL
ALT: 31 U/L (ref 0–44)
AST: 94 U/L — ABNORMAL HIGH (ref 15–41)
Albumin: 1.9 g/dL — ABNORMAL LOW (ref 3.5–5.0)
Alkaline Phosphatase: 98 U/L (ref 38–126)
Anion gap: 6 (ref 5–15)
BUN: 6 mg/dL (ref 6–20)
CO2: 24 mmol/L (ref 22–32)
Calcium: 6.9 mg/dL — ABNORMAL LOW (ref 8.9–10.3)
Chloride: 100 mmol/L (ref 98–111)
Creatinine, Ser: <0.3 mg/dL — ABNORMAL LOW (ref 0.44–1.00)
Glucose, Bld: 96 mg/dL (ref 70–99)
Potassium: 3.4 mmol/L — ABNORMAL LOW (ref 3.5–5.1)
Sodium: 130 mmol/L — ABNORMAL LOW (ref 135–145)
Total Bilirubin: 17.9 mg/dL — ABNORMAL HIGH (ref 0.3–1.2)
Total Protein: 5.7 g/dL — ABNORMAL LOW (ref 6.5–8.1)

## 2022-01-21 LAB — CBC
HCT: 35.1 % — ABNORMAL LOW (ref 36.0–46.0)
Hemoglobin: 12.3 g/dL (ref 12.0–15.0)
MCH: 35.5 pg — ABNORMAL HIGH (ref 26.0–34.0)
MCHC: 35 g/dL (ref 30.0–36.0)
MCV: 101.4 fL — ABNORMAL HIGH (ref 80.0–100.0)
Platelets: 70 10*3/uL — ABNORMAL LOW (ref 150–400)
RBC: 3.46 MIL/uL — ABNORMAL LOW (ref 3.87–5.11)
RDW: 21.7 % — ABNORMAL HIGH (ref 11.5–15.5)
WBC: 5.8 10*3/uL (ref 4.0–10.5)
nRBC: 0 % (ref 0.0–0.2)

## 2022-01-21 MED ORDER — HYDROMORPHONE HCL 1 MG/ML IJ SOLN
0.2500 mg | INTRAMUSCULAR | Status: DC | PRN
  Administered 2022-01-21 – 2022-01-22 (×3): 0.25 mg via INTRAVENOUS
  Filled 2022-01-21 (×3): qty 1

## 2022-01-21 MED ORDER — OXYCODONE HCL 5 MG PO TABS
2.5000 mg | ORAL_TABLET | ORAL | Status: DC | PRN
  Administered 2022-01-22 – 2022-01-23 (×6): 2.5 mg via ORAL
  Filled 2022-01-21 (×6): qty 1

## 2022-01-21 NOTE — Assessment & Plan Note (Addendum)
Ongoing alcohol abuse.  MELD score 30 by 8/17.  Last drink was on 8/10.  History of hepatitis B and C.  GI following.  Out of the window for alcohol withdrawals.  In regards to liver mass,MRI abd shows advanced hepatic cirrhosis w/ diffusely heterogenous steatosis favored to reflect hepatic fibrosis w/ multiple regenerating nodules. No enhancing lesions identified to suggest hepatocellular carcinoma.  She is not a candidate for liver transplant given ongoing alcohol abuse.  Bilirubin continued to trend upward and was 19 x 8/17.  I had a frank discussion with the patient and her husband.  I explained that with her worsening liver disease, she likely only had a few more months, especially in the lieu of ongoing alcohol abuse.  I talked about hospice services and they were amenable to a referral which they were given.

## 2022-01-21 NOTE — Progress Notes (Signed)
[facility] Progress Note  Patient: Rhonda Martin    UVO:[id number]  DOA: 01/15/2022    Date of Service: the patient was seen and examined on 01/21/2022  Brief hospital course: 46 year old female with past medical history of alcoholic cirrhosis with ongoing alcohol abuse, COPD, hypertension and status post right BKA who presented to the emergency room with left-sided abdominal pain plus nausea and vomiting and admitted for sepsis secondary to suspected SBP.  Patient started on IV fluids and IV antibiotics.  Assessment and Plan: Assessment and Plan: Severe sepsis ([place]) secondary to spontaneous bacterial peritonitis Meets criteria for severe sepsis on admission given lactic acidosis, tachycardia and tachypnea with source as SBP.  Placed on IV Rocephin and Flagyl.  Unfortunately, unable to get paracentesis due to minimal amount of accessible ascites.  Working on weaning pain medications down.  In the setting of alcoholic liver cirrhosis with associated ascites   Alcoholic cirrhosis of liver with ascites ([place]) Ongoing alcohol abuse.  MELD score 27.  Last drink was on 8/10.  History of hepatitis B and C.  GI following.  Out of the window for alcohol withdrawals.  In regards to liver mass,MRI abd shows advanced hepatic cirrhosis w/ diffusely heterogenous steatosis favored to reflect hepatic fibrosis w/ multiple regenerating nodules. No enhancing lesions identified to suggest hepatocellular carcinoma   Spontaneous bacterial peritonitis ([place]) - This is in the setting of alcoholic liver cirrhosis and associated ascites. - The patient will be placed on IV Rocephin and Flagyl. - Pain management will be provided. - IR paracentesis is ordered as mentioned above.  Hyperammonemia (HCC) Secondary to cirrhosis.  Lactulose changed to MiraLAX as per GI.  GERD without esophagitis - We will continue PPI therapy.  Hypokalemia Replaced accordingly  Chronic obstructive pulmonary disease (COPD) ([place]) -  We will continue her inhalers.  Currently stable.  Chronic pain - We will continue her Butrans patch.  Work on weaning off her as needed IV and p.o. narcotic medications  COPD with acute exacerbation ([place]) - We will continue her inhalers.       Body mass index is 27.44 kg/m.        Consultants: Gastroenterology Interventional radiology  Procedures: None  Antimicrobials: IV Rocephin and Flagyl 8/10-present  Code Status: Full code   Subjective: Patient states she is feeling little bit better, some nausea with eating, less pain  Objective: Vital signs were reviewed and unremarkable. Vitals:   01/21/22 0501 01/21/22 0805  BP: 104/65 95/66  Pulse: 86 95  Resp: 16 18  Temp: 98.3 F (36.8 C) 98.9 F (37.2 C)  SpO2: 92% 95%   No intake or output data in the 24 hours ending 01/21/22 1517 Filed Weights   01/15/22 1726  Weight: 68 kg   Body mass index is 27.44 kg/m.  Exam:  General: Alert and oriented x3, no acute distress HEENT: Normocephalic and atraumatic, sclera anicteric Cardiovascular: Regular rate and rhythm, S1-S2 Respiratory: Clear to auscultation bilaterally Abdomen: Soft, obese, positive ascites, hypoactive bowel sounds Musculoskeletal: No clubbing or cyanosis, trace pitting edema Skin:, Jaundiced skin Psychiatry: Appropriate, no evidence of psychoses Neurology: No focal deficits  Data Reviewed: Sodium today at 130 with potassium of 3.4, albumin of 1.9 and bilirubin of 17.9.  Disposition:  Status is: Inpatient Remains inpatient appropriate because: Weaning down pain medication    Anticipated discharge date: 8/17  Family Communication: Declined for me to call family DVT Prophylaxis: Place and maintain sequential compression device Start: 01/16/22 0401    Author: Laurel Dimmer  Wynelle Link ,MD 01/21/2022 3:17 PM  To reach On-call, see care teams to locate the attending and reach out via [email or web address]. Between 7PM-7AM, please contact  night-coverage If you still have difficulty reaching the attending provider, please page the [facility] (Director on Call) for [facility] on amion for assistance.

## 2022-01-21 NOTE — Plan of Care (Signed)

## 2022-01-21 NOTE — Hospital Course (Addendum)
46 year old female with past medical history of alcoholic cirrhosis with ongoing alcohol abuse, COPD, hypertension and status post right BKA who presented to the emergency room with left-sided abdominal pain plus nausea and vomiting and admitted for sepsis secondary to suspected SBP.  Patient started on IV fluids and IV antibiotics.  By 8/18, had completed antibiotic course.

## 2022-01-21 NOTE — Progress Notes (Signed)
GI Inpatient Follow-up Note  Subjective:  Patient seen and doing about the same.  Scheduled Inpatient Medications:   buprenorphine  1 patch Transdermal Weekly   lidocaine  1 patch Transdermal Q24H   metoprolol tartrate  12.5 mg Oral BID   mometasone-formoterol  2 puff Inhalation BID   multivitamin with minerals  1 tablet Oral Daily   pantoprazole  40 mg Oral Daily   polyethylene glycol  17 g Oral Daily    Continuous Inpatient Infusions:    cefTRIAXone (ROCEPHIN)  IV 2 g (01/20/22 1729)   metronidazole 500 mg (01/21/22 0912)    PRN Inpatient Medications:  ALPRAZolam, gabapentin, hydrocortisone, HYDROmorphone (DILAUDID) injection, ipratropium-albuterol, magnesium hydroxide, metoCLOPramide (REGLAN) injection, oxyCODONE, simethicone, traZODone  Review of Systems:  Review of Systems  Constitutional:  Negative for chills and fever.  Respiratory:  Negative for shortness of breath.   Cardiovascular:  Negative for chest pain.  Gastrointestinal:  Positive for abdominal pain. Negative for constipation and diarrhea.  Musculoskeletal:  Positive for joint pain.  Neurological:  Negative for focal weakness.  Psychiatric/Behavioral:  The patient is nervous/anxious.   All other systems reviewed and are negative.     Physical Examination: BP 95/66 (BP Location: Left Arm)   Pulse 95   Temp 98.9 F (37.2 C) (Oral)   Resp 18   Ht '5\' 2"'$  (1.575 m)   Wt 68 kg   LMP 01/14/2022 (Approximate)   SpO2 95%   BMI 27.44 kg/m  Gen: NAD, alert and oriented x 4 HEENT: scleral icterus present Neck: supple, no JVD or thyromegaly Chest: No respiratory distress Abd: distended, mildly tender but soft Ext: trace edema, well perfused with 2+ pulses, Skin: jaundiced Lymph: no LAD  Data: Lab Results  Component Value Date   WBC 5.8 01/21/2022   HGB 12.3 01/21/2022   HCT 35.1 (L) 01/21/2022   MCV 101.4 (H) 01/21/2022   PLT 70 (L) 01/21/2022   Recent Labs  Lab 01/19/22 0342 01/20/22 0427  01/21/22 0514  HGB 12.6 12.0 12.3   Lab Results  Component Value Date   NA 130 (L) 01/21/2022   K 3.4 (L) 01/21/2022   CL 100 01/21/2022   CO2 24 01/21/2022   BUN 6 01/21/2022   CREATININE <0.30 (L) 01/21/2022   Lab Results  Component Value Date   ALT 31 01/21/2022   AST 94 (H) 01/21/2022   GGT 92 (H) 07/14/2021   ALKPHOS 98 01/21/2022   BILITOT 17.9 (H) 01/21/2022   Recent Labs  Lab 01/16/22 0521  INR 1.8*   Assessment/Plan: Ms. Maxham is a 46 y.o. lady with alcohol cirrhosis who presented with acute on chronic liver failure likely secondary to alcoholic hepatitis in setting of alcohol and tylenol use. S/p NAC therapy  Recommendations:  - dispo planning - daily CMP and INR - encouraged nutrition, ordered protein supplementations - would hold off on any steroids at this time due to question of infection. Can be started as an outpatient - avoid opioid pan medicines if possible - PT/OT   Please call if any questions or concerns.  Raylene Miyamoto MD, MPH [place]

## 2022-01-22 ENCOUNTER — Telehealth: Payer: Self-pay | Admitting: Nurse Practitioner

## 2022-01-22 DIAGNOSIS — J449 Chronic obstructive pulmonary disease, unspecified: Secondary | ICD-10-CM | POA: Diagnosis not present

## 2022-01-22 DIAGNOSIS — K652 Spontaneous bacterial peritonitis: Secondary | ICD-10-CM | POA: Diagnosis not present

## 2022-01-22 DIAGNOSIS — K7031 Alcoholic cirrhosis of liver with ascites: Secondary | ICD-10-CM | POA: Diagnosis not present

## 2022-01-22 DIAGNOSIS — A419 Sepsis, unspecified organism: Secondary | ICD-10-CM | POA: Diagnosis not present

## 2022-01-22 LAB — COMPREHENSIVE METABOLIC PANEL
ALT: 30 U/L (ref 0–44)
AST: 106 U/L — ABNORMAL HIGH (ref 15–41)
Albumin: 2 g/dL — ABNORMAL LOW (ref 3.5–5.0)
Alkaline Phosphatase: 99 U/L (ref 38–126)
Anion gap: 8 (ref 5–15)
BUN: 6 mg/dL (ref 6–20)
CO2: 22 mmol/L (ref 22–32)
Calcium: 7.5 mg/dL — ABNORMAL LOW (ref 8.9–10.3)
Chloride: 101 mmol/L (ref 98–111)
Creatinine, Ser: <0.3 mg/dL — ABNORMAL LOW (ref 0.44–1.00)
Glucose, Bld: 103 mg/dL — ABNORMAL HIGH (ref 70–99)
Potassium: 3.4 mmol/L — ABNORMAL LOW (ref 3.5–5.1)
Sodium: 131 mmol/L — ABNORMAL LOW (ref 135–145)
Total Bilirubin: 19.5 mg/dL (ref 0.3–1.2)
Total Protein: 5.9 g/dL — ABNORMAL LOW (ref 6.5–8.1)

## 2022-01-22 MED ORDER — ENSURE ENLIVE PO LIQD
237.0000 mL | Freq: Two times a day (BID) | ORAL | Status: DC
  Administered 2022-01-22: 237 mL via ORAL

## 2022-01-22 MED ORDER — POLYETHYLENE GLYCOL 3350 17 G PO PACK
17.0000 g | PACK | Freq: Two times a day (BID) | ORAL | Status: DC
  Administered 2022-01-22 – 2022-01-23 (×2): 17 g via ORAL
  Filled 2022-01-22 (×2): qty 1

## 2022-01-22 MED ORDER — ENSURE ENLIVE PO LIQD
237.0000 mL | Freq: Three times a day (TID) | ORAL | Status: DC
  Administered 2022-01-22 – 2022-01-23 (×2): 237 mL via ORAL

## 2022-01-22 NOTE — Telephone Encounter (Signed)
Will leave for Santiago Glad review -- was admitted for sepsis and alcoholic cirrhosis.  Still inpatient.  She has seen Dr. Neomia Dear, but not Santiago Glad yet.

## 2022-01-22 NOTE — Plan of Care (Signed)

## 2022-01-22 NOTE — Progress Notes (Signed)
[facility] Progress Note  Patient: Rhonda Martin    YPP:[id number]  DOA: 01/15/2022    Date of Service: the patient was seen and examined on 01/22/2022  Brief hospital course: 46 year old female with past medical history of alcoholic cirrhosis with ongoing alcohol abuse, COPD, hypertension and status post right BKA who presented to the emergency room with left-sided abdominal pain plus nausea and vomiting and admitted for sepsis secondary to suspected SBP.  Patient started on IV fluids and IV antibiotics.  Assessment and Plan: Assessment and Plan: Severe sepsis ([place]) secondary to spontaneous bacterial peritonitis Meets criteria for severe sepsis on admission given lactic acidosis, tachycardia and tachypnea with source as SBP.  Placed on IV Rocephin and Flagyl.  Unfortunately, unable to get paracentesis due to minimal amount of accessible ascites.  Working on weaning pain medications down.  In the setting of alcoholic liver cirrhosis with associated ascites   Alcoholic cirrhosis of liver with ascites ([place]) Ongoing alcohol abuse.  MELD score 30.  Last drink was on 8/10.  History of hepatitis B and C.  GI following.  Out of the window for alcohol withdrawals.  In regards to liver mass,MRI abd shows advanced hepatic cirrhosis w/ diffusely heterogenous steatosis favored to reflect hepatic fibrosis w/ multiple regenerating nodules. No enhancing lesions identified to suggest hepatocellular carcinoma.  She is not a candidate for liver transplant given ongoing alcohol abuse.  Bilirubin trending upward.  Spontaneous bacterial peritonitis ([place]) - This is in the setting of alcoholic liver cirrhosis and associated ascites. - The patient will be placed on IV Rocephin and Flagyl. - Pain management will be provided. - IR paracentesis is ordered as mentioned above.  Hyperammonemia (HCC) Secondary to cirrhosis.  Lactulose changed to MiraLAX as per GI.  GERD without esophagitis - We will continue PPI  therapy.  Hypokalemia Replaced accordingly  Chronic obstructive pulmonary disease (COPD) ([place]) - We will continue her inhalers.  Currently stable.  Chronic pain - We will continue her Butrans patch.  Work on weaning off her as needed IV and p.o. narcotic medications  COPD with acute exacerbation ([place]) - We will continue her inhalers.       Body mass index is 27.44 kg/m.  Nutrition Problem: Inadequate oral intake Etiology: decreased appetite     Consultants: Gastroenterology Interventional radiology  Procedures: None  Antimicrobials: IV Rocephin and Flagyl 8/10-present  Code Status: Full code   Subjective: Still some occasional abdominal pain although more subjective  Objective: Vital signs were reviewed and unremarkable. Vitals:   01/22/22 0800 01/22/22 1600  BP: (!) 110/57 104/67  Pulse: 97 95  Resp: 16 16  Temp: 98.3 F (36.8 C) 98.3 F (36.8 C)  SpO2: 94% 93%   No intake or output data in the 24 hours ending 01/22/22 1711 Filed Weights   01/15/22 1726  Weight: 68 kg   Body mass index is 27.44 kg/m.  Exam:  General: Alert and oriented x3, no acute distress HEENT: Normocephalic and atraumatic, sclera anicteric Cardiovascular: Regular rate and rhythm, S1-S2 Respiratory: Clear to auscultation bilaterally Abdomen: Soft, obese, positive ascites, hypoactive bowel sounds Musculoskeletal: No clubbing or cyanosis, trace pitting edema Skin:, Jaundiced skin Psychiatry: Appropriate, no evidence of psychoses Neurology: No focal deficits  Data Reviewed: Bilirubin increased to 19.5  Disposition:  Status is: Inpatient Remains inpatient appropriate because: Weaning down pain medication    Anticipated discharge date: 8/ 18  Family Communication: Declined for me to call family DVT Prophylaxis: Place and maintain sequential compression device Start:  01/16/22 0401    Author: Annita Brod ,MD 01/22/2022 5:11 PM  To reach On-call, see care  teams to locate the attending and reach out via [email or web address]. Between 7PM-7AM, please contact night-coverage If you still have difficulty reaching the attending provider, please page the [facility] (Director on Call) for [facility] on amion for assistance.

## 2022-01-22 NOTE — TOC Progression Note (Signed)
Transition of Care [facility]) - Progression Note    Patient Details  Name: Rhonda Martin MRN: [id number] Date of Birth: Aug 18, 1975  [facility]) CM/SW Contact  Laurena Slimmer, RN Phone Number: 01/22/2022, 11:03 AM  Clinical Narrative:    Spoke with [employer] Case Manager to give requested update on condition, barriers to discharge and expected discharge date. Briant Cedar will reach out to patient for care continuity follow needs post discharge.    Expected Discharge Plan: Home/Self Care    Expected Discharge Plan and Services Expected Discharge Plan: Home/Self Care                                               Social Determinants of Health (SDOH) Interventions    Readmission Risk Interventions    01/18/2022    3:08 PM 02/19/2020    3:54 PM 11/29/2019    2:37 PM  Readmission Risk Prevention Plan  Transportation Screening Complete Complete Complete  PCP or Specialist Appt within 3-5 Days Complete    HRI or [place] Complete    Social Work Consult for [place] Planning/Counseling Complete    Palliative Care Screening Not Applicable    Medication Review Press photographer) Complete Complete Complete  PCP or Specialist appointment within 3-5 days of discharge  Complete Complete  HRI or [place]  Complete   SW Recovery Care/Counseling Consult  Complete   Palliative Care Screening  Not Applicable Not [place]  Complete Not Applicable

## 2022-01-22 NOTE — Progress Notes (Signed)
GI Inpatient Follow-up Note  Subjective:  Patient seen and is the same. T. Bili has risen some.  Scheduled Inpatient Medications:   buprenorphine  1 patch Transdermal Weekly   feeding supplement  237 mL Oral BID BM   lidocaine  1 patch Transdermal Q24H   metoprolol tartrate  12.5 mg Oral BID   mometasone-formoterol  2 puff Inhalation BID   multivitamin with minerals  1 tablet Oral Daily   pantoprazole  40 mg Oral Daily   polyethylene glycol  17 g Oral Daily    Continuous Inpatient Infusions:    cefTRIAXone (ROCEPHIN)  IV 2 g (01/21/22 1742)    PRN Inpatient Medications:  ALPRAZolam, gabapentin, hydrocortisone, ipratropium-albuterol, magnesium hydroxide, metoCLOPramide (REGLAN) injection, oxyCODONE, simethicone, traZODone  Review of Systems:  Review of Systems  Constitutional:  Negative for chills and fever.  Respiratory:  Negative for shortness of breath.   Cardiovascular:  Negative for chest pain.  Gastrointestinal:  Positive for abdominal pain.  Musculoskeletal:  Positive for joint pain.  Skin:  Negative for itching and rash.  Neurological:  Positive for weakness. Negative for focal weakness.  Psychiatric/Behavioral:  Positive for substance abuse. The patient is nervous/anxious and has insomnia.   All other systems reviewed and are negative.     Physical Examination: BP (!) 110/57 (BP Location: Right Arm)   Pulse 97   Temp 98.3 F (36.8 C) (Oral)   Resp 16   Ht '5\' 2"'$  (1.575 m)   Wt 68 kg   LMP 01/14/2022 (Approximate)   SpO2 94%   BMI 27.44 kg/m  Gen: NAD, alert and oriented x 4 HEENT: scleral icterus present Neck: supple, no JVD or thyromegaly Chest: No respiratory distress Abd: soft, but distended Ext: no edema, well perfused with 2+ pulses, Skin: jaundiced Lymph: no LAD  Data: Lab Results  Component Value Date   WBC 5.8 01/21/2022   HGB 12.3 01/21/2022   HCT 35.1 (L) 01/21/2022   MCV 101.4 (H) 01/21/2022   PLT 70 (L) 01/21/2022   Recent Labs   Lab 01/19/22 0342 01/20/22 0427 01/21/22 0514  HGB 12.6 12.0 12.3   Lab Results  Component Value Date   NA 131 (L) 01/22/2022   K 3.4 (L) 01/22/2022   CL 101 01/22/2022   CO2 22 01/22/2022   BUN 6 01/22/2022   CREATININE <0.30 (L) 01/22/2022   Lab Results  Component Value Date   ALT 30 01/22/2022   AST 106 (H) 01/22/2022   GGT 92 (H) 07/14/2021   ALKPHOS 99 01/22/2022   BILITOT 19.5 (HH) 01/22/2022   Recent Labs  Lab 01/16/22 0521  INR 1.8*   Assessment/Plan: Ms. Oser is a 46 y.o. lady with alcohol cirrhosis who presented with acute on chronic liver failure likely secondary to alcoholic hepatitis in setting of alcohol and tylenol use. S/p NAC therapy. Patient with poor prognosis, current MELD-Na is around 29 with estimated 3 month mortality of almost 20%  Recommendations:  - dispo planning, will need to start weaning off IV opioid pain medicines and avoid PO pain medicines if at all possible - encouraged nutrition, ordered protein supplemenation - would hold off on any steroids at this time due to question of infection. Can be started as an outpatient - avoid opioid pan medicines if possible - PT/OT   Will continue to follow, please call with any questions or concerns.  Raylene Miyamoto MD, MPH [place]

## 2022-01-22 NOTE — Progress Notes (Signed)
Initial Nutrition Assessment  DOCUMENTATION CODES:   Not applicable  INTERVENTION:   -Ensure Enlive po TID, each supplement provides 350 kcal and 20 grams of protein -MVI with minerals daily -Liberalize diet to regular for widest variety of meal selections  NUTRITION DIAGNOSIS:   Inadequate oral intake related to decreased appetite as evidenced by meal completion < 25%, per patient/family report.  GOAL:   Patient will meet greater than or equal to 90% of their needs  MONITOR:   PO intake, Supplement acceptance  REASON FOR ASSESSMENT:   Consult Assessment of nutrition requirement/status  ASSESSMENT:   Pt with past medical history of alcoholic cirrhosis with ongoing alcohol abuse, COPD, hypertension and status post right BKA who presented with left-sided abdominal pain plus nausea and vomiting and admitted for sepsis secondary to suspected SBP  Pt admitted with severe sepsis secondary to spontaneous bacterial peritonitis.   Reviewed I/O's: +142 ml x 24 hours   Spoke with pt at bedside, who reports feeling terrible today. Per pt, she has had decreased appetite over the past 2-3 weeks due to abdominal pain. Per pt, she was only able to eat small amounts of food during this time, but unable to provide specifics ("whatever was around"). Noted meal completions 0%. Lunch tray at bedside, which was unattempted.   Per pt, she feel more comfortable drinking than eating at this time. She is open to drinking nutritional supplements, but is concerned that she has not received any yet, even though her GI MD recommended them. Discussed importance of good meal and supplement intake to promote healing. Pt amenable to supplements.   Reviewed wt hx; pt has experienced a 2.9% wt loss over the past 6 months, which is not significant for time frame.    Pt explains that she has some muscle atrophy, which is believes is related to her Charcot-Marie-Tooth disease.   Medications reviewed and  include miralax.   Lab Results  Component Value Date   HGBA1C 4.6 (L) 10/09/2019   PTA DM medications are none.   Labs reviewed: Na: 130, K: 3.4, CBGS: 131 (inpatient orders for glycemic control are none).    NUTRITION - FOCUSED PHYSICAL EXAM:  Flowsheet Row Most Recent Value  Orbital Region No depletion  Upper Arm Region Mild depletion  Thoracic and Lumbar Region No depletion  Buccal Region No depletion  Temple Region No depletion  Clavicle Bone Region No depletion  Clavicle and Acromion Bone Region No depletion  Scapular Bone Region No depletion  Dorsal Hand Mild depletion  Patellar Region Mild depletion  Anterior Thigh Region Mild depletion  Posterior Calf Region Mild depletion  Edema (RD Assessment) None  Hair Reviewed  Eyes Reviewed  Mouth Reviewed  Skin Reviewed  Nails Reviewed       Diet Order:   Diet Order             Diet regular Room service appropriate? Yes; Fluid consistency: Thin  Diet effective now                   EDUCATION NEEDS:   Education needs have been addressed  Skin:  Skin Assessment: Reviewed RN Assessment  Last BM:  01/21/22 (type 6)  Height:   Ht Readings from Last 1 Encounters:  01/15/22 '5\' 2"'$  (1.575 m)    Weight:   Wt Readings from Last 1 Encounters:  01/15/22 68 kg    Ideal Body Weight:  46 kg (adjusted for rt AKA)  BMI:  Body mass index is  27.44 kg/m.  Estimated Nutritional Needs:   Kcal:  1650-1850  Protein:  80-95 grams  Fluid:  > 1.6 L    Loistine Chance, RD, LDN, [place] Registered Dietitian II Certified Diabetes Care and Education Specialist Please refer to [facility] for RD and/or RD on-call/weekend/after hours pager

## 2022-01-22 NOTE — Telephone Encounter (Signed)
Rhonda Martin called from [employer] and stated that the was admitted to [facility] on 01/15/22. She wanted to make sure her provider was aware.

## 2022-01-23 DIAGNOSIS — E876 Hypokalemia: Secondary | ICD-10-CM | POA: Diagnosis not present

## 2022-01-23 DIAGNOSIS — K7031 Alcoholic cirrhosis of liver with ascites: Secondary | ICD-10-CM | POA: Diagnosis not present

## 2022-01-23 DIAGNOSIS — A419 Sepsis, unspecified organism: Secondary | ICD-10-CM | POA: Diagnosis not present

## 2022-01-23 DIAGNOSIS — K652 Spontaneous bacterial peritonitis: Secondary | ICD-10-CM | POA: Diagnosis not present

## 2022-01-23 MED ORDER — METOPROLOL TARTRATE 25 MG PO TABS: 12.5000 mg | ORAL_TABLET | Freq: Two times a day (BID) | ORAL | 1 refills | Status: AC

## 2022-01-23 MED ORDER — ENSURE ENLIVE PO LIQD
237.0000 mL | Freq: Three times a day (TID) | ORAL | 12 refills | Status: AC
Start: 2022-01-23 — End: ?

## 2022-01-23 MED ORDER — OXYCODONE HCL 5 MG PO TABS
5.0000 mg | ORAL_TABLET | Freq: Once | ORAL | Status: AC
  Administered 2022-01-23: 5 mg via ORAL
  Filled 2022-01-23: qty 1

## 2022-01-23 NOTE — Progress Notes (Signed)
Manufacturing engineer [facility]) Hospital Liaison Note   Received request from Transitions of Care Manager, Donny Pique, for hospice services at home after discharge. Chart and patient information under review by [facility] physician. Hospice eligibility approved.   Spoke with patient to initiate education related to hospice philosophy, services, and team approach to care. Patient verbalized understanding of information given. Per discussion, the plan is for patient to discharge home via private vehicle once cleared to DC.    DME needs discussed. Patient has the following equipment in the home (Purchased privately): N/A Patient requests the following equipment for delivery: [employer]  Address verified and is correct in the chart. Spouse/Douglas is the family member to contact to arrange time of equipment delivery.    Please send signed and completed DNR home with patient/family. Please provide prescriptions at discharge as needed to ensure ongoing symptom management.    [facility] information and contact numbers given to family & above information shared with TOC.   Please call with any questions/concerns.    Thank you for the opportunity to participate in this patient's care.   Daphene Calamity, MSW [facility] Liaison

## 2022-01-23 NOTE — Plan of Care (Signed)

## 2022-01-23 NOTE — Discharge Summary (Signed)
Physician Discharge Summary   Patient: Rhonda Martin MRN: [id number] DOB: 09-Feb-1976  Admit date:     01/15/2022  Discharge date: 01/23/22  Discharge Physician: Annita Brod   PCP: Jon Billings, NP   Recommendations at discharge:   Patient given referral to hospice New medication: Lopressor 12.5 mg p.o. twice daily  Discharge Diagnoses: Active Problems:   Alcoholic cirrhosis of liver with ascites (HCC)   Hyperammonemia (HCC)   Hypokalemia   GERD without esophagitis   Chronic pain   Chronic obstructive pulmonary disease (COPD) ([place])  Resolved Problems:   Severe sepsis (HCC) secondary to spontaneous bacterial peritonitis  Hospital Course: 46 year old female with past medical history of alcoholic cirrhosis with ongoing alcohol abuse, COPD, hypertension and status post right BKA who presented to the emergency room with left-sided abdominal pain plus nausea and vomiting and admitted for sepsis secondary to suspected SBP.  Patient started on IV fluids and IV antibiotics.  By 8/18, had completed antibiotic course.  Assessment and Plan: Severe sepsis ([place]) secondary to spontaneous bacterial peritonitis-resolved as of 01/23/2022 Met criteria for severe sepsis on admission given lactic acidosis, tachycardia and tachypnea with source as SBP.  Placed on IV Rocephin and Flagyl.  Unfortunately, unable to get paracentesis due to minimal amount of accessible ascites.  SBP's in the setting of alcoholic liver cirrhosis with associated ascites.  By 8/18, patient had completed antibiotic course.    Alcoholic cirrhosis of liver with ascites ([place]) Ongoing alcohol abuse.  MELD score 30 by 8/17.  Last drink was on 8/10.  History of hepatitis B and C.  GI following.  Out of the window for alcohol withdrawals.  In regards to liver mass,MRI abd shows advanced hepatic cirrhosis w/ diffusely heterogenous steatosis favored to reflect hepatic fibrosis w/ multiple regenerating nodules. No enhancing  lesions identified to suggest hepatocellular carcinoma.  She is not a candidate for liver transplant given ongoing alcohol abuse.  Bilirubin continued to trend upward and was 19 x 8/17.  I had a frank discussion with the patient and her husband.  I explained that with her worsening liver disease, she likely only had a few more months, especially in the lieu of ongoing alcohol abuse.  I talked about hospice services and they were amenable to a referral which they were given.   Spontaneous bacterial peritonitis ([place]) - This is in the setting of alcoholic liver cirrhosis and associated ascites. - The patient will be placed on IV Rocephin and Flagyl. - Pain management will be provided. - IR paracentesis is ordered as mentioned above.  Hyperammonemia (HCC) Secondary to cirrhosis.  Lactulose changed to MiraLAX as per GI.  GERD without esophagitis - We will continue PPI therapy.  Hypokalemia Replaced accordingly  Chronic obstructive pulmonary disease (COPD) ([place]) - We will continue her inhalers.  Currently stable.  Chronic pain - We will continue her Butrans patch.  Work on weaning off her as needed IV and p.o. narcotic medications  COPD with acute exacerbation ([place]) - We will continue her inhalers.        Pain control - [employer] Controlled Substance Reporting System database was reviewed. and patient was instructed, not to drive, operate heavy machinery, perform activities at heights, swimming or participation in water activities or provide baby-sitting services while on Pain, Sleep and Anxiety Medications; until their outpatient Physician has advised to do so again. Also recommended to not to take more than prescribed Pain, Sleep and Anxiety Medications.  Consultants: Gastroenterology Procedures performed: None Disposition: Home  Diet recommendation:  Discharge Diet Orders (From admission, onward)     Start     Ordered   01/23/22 0000  Diet - low sodium heart healthy         01/23/22 1049           Cardiac diet with Ensure 3 times daily between meals DISCHARGE MEDICATION: Allergies as of 01/23/2022       Reactions   Tylenol [acetaminophen] Other (See Comments)   Liver disease        Medication List     STOP taking these medications    HYDROcodone-acetaminophen 5-325 MG tablet Commonly known as: NORCO/VICODIN       TAKE these medications    Butrans 7.5 MCG/HR Generic drug: buprenorphine 1 patch once a week.   feeding supplement Liqd Take 237 mLs by mouth 3 (three) times daily between meals.   gabapentin 300 MG capsule Commonly known as: NEURONTIN Take 300 mg by mouth 2 (two) times daily as needed.   hydrocortisone 2.5 % rectal cream Commonly known as: ANUSOL-HC Place 1 application. rectally 2 (two) times daily.   lidocaine 5 % Commonly known as: LIDODERM Place 1 patch onto the skin daily. Remove & Discard patch within 12 hours or as directed by MD   metoprolol tartrate 25 MG tablet Commonly known as: LOPRESSOR Take 0.5 tablets (12.5 mg total) by mouth 2 (two) times daily.   mometasone-formoterol 200-5 MCG/ACT Aero Commonly known as: DULERA Inhale 2 puffs into the lungs 2 (two) times daily.   multivitamin with minerals Tabs tablet Take 1 tablet by mouth daily.   omeprazole 40 MG capsule Commonly known as: PRILOSEC Take 1 capsule (40 mg total) by mouth 2 (two) times daily before a meal.        Discharge Exam: Filed Weights   01/15/22 1726  Weight: 68 kg   General: Alert and oriented x3, jaundiced, tearful Cardiovascular: Regular rate and rhythm, S1-S2 Lungs: Clear to auscultation bilaterally  Condition at discharge: good  The results of significant diagnostics from this hospitalization (including imaging, microbiology, ancillary and laboratory) are listed below for reference.   Imaging Studies: MR ABDOMEN W WO CONTRAST  Result Date: 01/16/2022 CLINICAL DATA:  Liver failure.  Cirrhosis. EXAM: MRI ABDOMEN  WITHOUT AND WITH CONTRAST TECHNIQUE: Multiplanar multisequence MR imaging of the abdomen was performed both before and after the administration of intravenous contrast. CONTRAST:  7.48m GADAVIST GADOBUTROL 1 MMOL/ML IV SOLN COMPARISON:  Abdominal ultrasound 01/16/2022. Abdominopelvic CT 01/15/2022 and 10/01/2021. FINDINGS: Lower chest: No significant findings are demonstrated at the lung bases. Hepatobiliary: As previously demonstrated, the liver is grossly abnormal with changes of advanced hepatic cirrhosis. There is diffuse contour irregularity of the liver with relative enlargement of the left and caudate lobes. There is heterogeneous loss of signal on the gradient echo opposed phase and FISP images, consistent with heterogeneous steatosis. There is also mild T2 heterogeneity but no significant restricted diffusion. Following contrast, there is diffuse heterogeneous enhancement of the liver, but no arterial phase enhancing lesions are identified. Appearance favors confluent hepatic fibrosis with multiple regenerating nodules. Possible small dependent gallstones. No gallbladder wall thickening or biliary dilatation. The portal and hepatic veins are patent. Recanalized left paraumbilical vein noted. Pancreas: Unremarkable. No pancreatic ductal dilatation or surrounding inflammatory changes. Spleen: Measures 15.0 x 7.8 x 11.0 cm (volume = 670 cm^3), consistent with mild to moderate splenomegaly. No focal abnormality. Adrenals/Urinary Tract: Both adrenal glands appear normal. Both kidneys appear unremarkable. No evidence of renal mass  or hydronephrosis. Bladder not imaged. Stomach/Bowel: Mild generalized bowel wall thickening, attributed to liver disease and ascites. No significant bowel distension or focal abnormality identified. Vascular/Lymphatic: There are no enlarged abdominal lymph nodes. Patent hepatic and portal veins. The IVC is patent. Recanalized left paraumbilical vein with scattered mesenteric and  abdominal wall collaterals. Other: Small to moderate amount of ascites without abnormal enhancement. Generalized mesenteric edema. Musculoskeletal: Numerous grossly stable thoracolumbar compression deformities. No acute osseous findings are evident. IMPRESSION: 1. Changes of advanced hepatic cirrhosis with diffusely heterogeneous steatosis and enhancement, favored to reflect confluent hepatic fibrosis with multiple regenerating nodules. No focal arterial phase enhancing lesions identified to suggest infiltrative hepatocellular carcinoma. 2. Mild portal hypertension with mild splenomegaly and portosystemic collaterals. 3. Small to moderate amount of ascites. Electronically Signed   By: Richardean Sale M.D.   On: 01/16/2022 14:34   US Abdomen Limited  Result Date: 01/16/2022 CLINICAL DATA:  Ascites EXAM: LIMITED ABDOMEN ULTRASOUND FOR ASCITES TECHNIQUE: Limited ultrasound survey for ascites was performed in all four abdominal quadrants. COMPARISON:  None Available. FINDINGS: Limited sonographic exam of the 4 quadrants of the abdomen was performed for fluid assessment. Images demonstrate no ascites. No intervention performed. IMPRESSION: No ascites.  No paracentesis performed. Electronically Signed   By: Albin Felling M.D.   On: 01/16/2022 13:38   DG Chest Portable 1 View  Result Date: 01/15/2022 CLINICAL DATA:  Sepsis.  Jaundice.  Severe abdominal pain. EXAM: PORTABLE CHEST 1 VIEW COMPARISON:  One-view chest x-ray 10/01/2021 FINDINGS: Heart size is normal. Increased diffuse interstitial pattern is consistent with edema. No focal airspace consolidation is present. Visualized soft tissues and bony thorax are unremarkable. IMPRESSION: Increased diffuse interstitial pattern consistent with edema. Electronically Signed   By: San Morelle M.D.   On: 01/15/2022 19:41   CT ABDOMEN PELVIS W CONTRAST  Result Date: 01/15/2022 CLINICAL DATA:  Abdominal pain, acute, nonlocalized abd pain swelling. Pt to triage  via wheelchair. Pt has abd pain with swelling pt states her pmd sent her to er for eval of abd pain. Pt has n/v. Hx liver problems. EXAM: CT ABDOMEN AND PELVIS WITH CONTRAST TECHNIQUE: Multidetector CT imaging of the abdomen and pelvis was performed using the standard protocol following bolus administration of intravenous contrast. RADIATION DOSE REDUCTION: This exam was performed according to the departmental dose-optimization program which includes automated exposure control, adjustment of the mA and/or kV according to patient size and/or use of iterative reconstruction technique. CONTRAST:  12m OMNIPAQUE IOHEXOL 300 MG/ML  SOLN COMPARISON:  Ultrasound abdomen 07/14/2021 FINDINGS: Lower chest: No acute abnormality. Hepatobiliary: Nodular hepatic contour. Markedly heterogeneous hepatic parenchyma with query underlying mass lesions. Calcified gallstone within the gallbladder lumen. No gallbladder wall thickening or pericholecystic fluid. No biliary dilatation. Pancreas: No focal lesion. Normal pancreatic contour. No surrounding inflammatory changes. No main pancreatic ductal dilatation. Spleen: Normal in size without focal abnormality. Adrenals/Urinary Tract: No adrenal nodule bilaterally. Bilateral kidneys enhance symmetrically. No hydronephrosis. No hydroureter. The urinary bladder is unremarkable. Stomach/Bowel: Stomach is within normal limits. No evidence of bowel wall thickening or dilatation. Appendix appears normal. Vascular/Lymphatic: Recanalized paraumbilical vein. Perigastric venous collaterals. The portal, splenic, superior mesenteric veins are patent. No abdominal aorta or iliac aneurysm. Mild to moderate atherosclerotic plaque of the aorta and its branches. No abdominal, pelvic, or inguinal lymphadenopathy. Reproductive: Uterus and bilateral adnexa are unremarkable. Other: Small volume simple free fluid. No intraperitoneal free gas. No organized fluid collection. Musculoskeletal: Tiny fat containing  umbilical hernia. No suspicious lytic or blastic  osseous lesions. No acute displaced fracture. Multilevel degenerative changes of the spine. Chronic appearing anterior wedge compression fracture of the T10 and L1 vertebral bodies. Superior endplate Schmorl node with concavity at the L3 level. IMPRESSION: 1. Cirrhosis with portal hypertension. Diffusely heterogeneous hepatic parenchyma with query underlying mass lesions concerning for malignancy. When the patient is clinically stable and able to follow directions and hold their breath (preferably as an outpatient) further evaluation with dedicated abdominal MRI liver protocol should be considered. 2. Small volume simple free fluid ascites. 3. Cholelithiasis with no CT finding of acute cholecystitis. 4.  Aortic Atherosclerosis (ICD10-I70.0). Electronically Signed   By: Iven Finn M.D.   On: 01/15/2022 18:56    Microbiology: Results for orders placed or performed during the hospital encounter of 01/15/22  Blood culture (routine x 2)     Status: None   Collection Time: 01/15/22  6:36 PM   Specimen: BLOOD LEFT ARM  Result Value Ref Range Status   Specimen Description BLOOD LEFT ARM  Final   Special Requests   Final    BOTTLES DRAWN AEROBIC AND ANAEROBIC Blood Culture adequate volume   Culture   Final    NO GROWTH 5 DAYS Performed at [facility], [address] [place], [place] [address]    Report Status 01/20/2022 FINAL  Final  Blood culture (routine x 2)     Status: None   Collection Time: 01/15/22  6:36 PM   Specimen: Left Antecubital; Blood  Result Value Ref Range Status   Specimen Description LEFT ANTECUBITAL  Final   Special Requests   Final    BOTTLES DRAWN AEROBIC AND ANAEROBIC Blood Culture results may not be optimal due to an excessive volume of blood received in culture bottles   Culture   Final    NO GROWTH 5 DAYS Performed at [facility], [address] [place], [place] [address]    Report Status  01/20/2022 FINAL  Final  SARS Coronavirus 2 by RT PCR (hospital order, performed in [place] hospital lab) *cepheid single result test* Anterior Nasal Swab     Status: None   Collection Time: 01/15/22  8:00 PM   Specimen: Anterior Nasal Swab  Result Value Ref Range Status   SARS Coronavirus 2 by RT PCR NEGATIVE NEGATIVE Final    Comment: Performed at [facility] [address] [place], [place] [address]    Labs: CBC: Recent Labs  Lab 01/17/22 0506 01/18/22 0342 01/19/22 0342 01/20/22 0427 01/21/22 0514  WBC 5.1 5.5 6.4 6.8 5.8  HGB 11.6* 12.0 12.6 12.0 12.3  HCT 31.4* 33.4* 35.5* 34.4* 35.1*  MCV 97.2 99.4 100.9* 101.5* 101.4*  PLT 70* 68* 74* 80* 70*   Basic Metabolic Panel: Recent Labs  Lab 01/17/22 0506 01/17/22 2113 01/18/22 0342 01/19/22 0342 01/20/22 0427 01/21/22 0514 01/22/22 1057  NA 132*  --  134* 133* 131* 130* 131*  K 2.7*   < > 2.8* 3.3* 3.6 3.4* 3.4*  CL 95*  --  99 100 100 100 101  CO2 28  --  _0 GLUCOSE 137*  --  140* 120* 99 96 103*  BUN <5*  --  <5* 6 5* 6 6  CREATININE <0.30*  --  <0.30* <0.30* <0.30* <0.30* <0.30*  CALCIUM 6.7*  --  6.4* 6.6* 6.6* 6.9* 7.5*  MG 2.4  --   --   --   --   --   --    < > =  values in this interval not displayed.   Liver Function Tests: Recent Labs  Lab 01/18/22 0342 01/19/22 0342 01/20/22 0427 01/21/22 0514 01/22/22 1057  AST 97* 100* 93* 94* 106*  ALT 36 37 34 31 30  ALKPHOS 87 101 100 98 99  BILITOT 16.9* 15.4* 16.5* 17.9* 19.5*  PROT 5.7* 6.0* 5.9* 5.7* 5.9*  ALBUMIN 1.9* 2.0* 2.0* 1.9* 2.0*   CBG: No results for input(s): "GLUCAP" in the last 168 hours.  Discharge time spent: less than 30 minutes.  Signed: Annita Brod, MD [facility] 01/23/2022

## 2022-01-23 NOTE — TOC Progression Note (Signed)
Transition of Care [facility]) - Progression Note    Patient Details  Name: Rhonda Martin MRN: [id number] Date of Birth: November 15, 1975  [facility] CM/SW Contact  Laurena Slimmer, RN Phone Number: 01/23/2022, 10:52 AM  Clinical Narrative:    Discharge orders received. Patient and spouse agreeable to hospice service in the home setting. Referral made to Daphene Calamity from [facility].    Expected Discharge Plan: Home/Self Care    Expected Discharge Plan and Services Expected Discharge Plan: Home/Self Care         Expected Discharge Date: 01/23/22                                     Social Determinants of Health (SDOH) Interventions    Readmission Risk Interventions    01/18/2022    3:08 PM 02/19/2020    3:54 PM 11/29/2019    2:37 PM  Readmission Risk Prevention Plan  Transportation Screening Complete Complete Complete  PCP or Specialist Appt within 3-5 Days Complete    HRI or [place] Complete    Social Work Consult for [place] Planning/Counseling Complete    Palliative Care Screening Not Applicable    Medication Review [employer]) Complete Complete Complete  PCP or Specialist appointment within 3-5 days of discharge  Complete Complete  HRI or [place]  Complete   SW Recovery Care/Counseling Consult  Complete   Palliative Care Screening  Not Applicable Not [place]  Complete Not Applicable

## 2022-01-23 NOTE — Progress Notes (Signed)
1106 D/c paperwork reviewed with pt, IV removed all questions and concerns answered. Pt ready for d/c after meeting with hospice coordinator.

## 2022-01-24 LAB — THC,MS,WB/SP RFX
Cannabidiol: 2.5 ng/mL
Cannabinoid Confirmation: POSITIVE

## 2022-01-26 NOTE — Telephone Encounter (Signed)
Lvm asking patient to call back to schedule her hospital follow up

## 2022-01-27 NOTE — Telephone Encounter (Signed)
Pt scheduled for Friday

## 2022-01-30 ENCOUNTER — Ambulatory Visit (INDEPENDENT_AMBULATORY_CARE_PROVIDER_SITE_OTHER): Payer: Medicaid Other | Admitting: Nurse Practitioner

## 2022-01-30 ENCOUNTER — Encounter: Payer: Self-pay | Admitting: Nurse Practitioner

## 2022-01-30 DIAGNOSIS — Z538 Procedure and treatment not carried out for other reasons: Secondary | ICD-10-CM

## 2022-01-30 NOTE — Progress Notes (Unsigned)
LMP 01/14/2022 (Approximate)    Subjective:    [name], female    DOB: Mar 22, 1976, 46 y.o.   MRN: [id number]  HPI: Rhonda Martin is a 46 y.o. female  Chief Complaint  Patient presents with   Hospitalization Follow-up    Not able to bill as TOC due to no call being made to the patient after discharge.  Transition of Care Hospital Follow up.   Hospital/Facility: [facility] Physician: Dr. [place] Pink D/C Date: 01/23/2022  Records Requested: NA Records Received: NA Records Reviewed: Yes  Diagnoses on Discharge:   Severe sepsis ([place]) secondary to spontaneous bacterial peritonitis-resolved as of 01/23/2022 Met criteria for severe sepsis on admission given lactic acidosis, tachycardia and tachypnea with source as SBP.  Placed on IV Rocephin and Flagyl.  Unfortunately, unable to get paracentesis due to minimal amount of accessible ascites.  SBP's in the setting of alcoholic liver cirrhosis with associated ascites.   By 8/18, patient had completed antibiotic course.     Alcoholic cirrhosis of liver with ascites ([place]) Ongoing alcohol abuse.  MELD score 30 by 8/17.  Last drink was on 8/10.  History of hepatitis B and C.  GI following.  Out of the window for alcohol withdrawals.  In regards to liver mass,MRI abd shows advanced hepatic cirrhosis w/ diffusely heterogenous steatosis favored to reflect hepatic fibrosis w/ multiple regenerating nodules. No enhancing lesions identified to suggest hepatocellular carcinoma.  She is not a candidate for liver transplant given ongoing alcohol abuse.  Bilirubin continued to trend upward and was 19 x 8/17.  I had a frank discussion with the patient and her husband.  I explained that with her worsening liver disease, she likely only had a few more months, especially in the lieu of ongoing alcohol abuse.  I talked about hospice services and they were amenable to a referral which they were given.    Spontaneous bacterial peritonitis  ([place]) - This is in the setting of alcoholic liver cirrhosis and associated ascites. - The patient will be placed on IV Rocephin and Flagyl. - Pain management will be provided. - IR paracentesis is ordered as mentioned above.   Hyperammonemia ([facility]) Secondary to cirrhosis.  Lactulose changed to MiraLAX as per GI.   GERD without esophagitis - We will continue PPI therapy.   Hypokalemia Replaced accordingly   Chronic obstructive pulmonary disease (COPD) ([place]) - We will continue her inhalers.  Currently stable.   Chronic pain - We will continue her Butrans patch.  Work on weaning off her as needed IV and p.o. narcotic medications   COPD with acute exacerbation ([place]) - We will continue her inhalers.  Date of interactive Contact within 48 hours of discharge:  Contact was through:  No Contact  Date of 7 day or 14 day face-to-face visit:    within 7 days  Outpatient Encounter Medications as of 01/30/2022  Medication Sig   BUTRANS 7.5 MCG/HR 1 patch once a week.   feeding supplement (ENSURE ENLIVE / ENSURE PLUS) LIQD Take 237 mLs by mouth 3 (three) times daily between meals.   gabapentin (NEURONTIN) 300 MG capsule Take 300 mg by mouth 2 (two) times daily as needed.   hydrocortisone (ANUSOL-HC) 2.5 % rectal cream Place 1 application. rectally 2 (two) times daily.   lidocaine (LIDODERM) 5 % Place 1 patch onto the skin daily. Remove & Discard patch within 12 hours or as directed by MD   metoprolol tartrate (LOPRESSOR) 25 MG tablet Take  0.5 tablets (12.5 mg total) by mouth 2 (two) times daily.   mometasone-formoterol (DULERA) 200-5 MCG/ACT AERO Inhale 2 puffs into the lungs 2 (two) times daily.   Multiple Vitamin (MULTIVITAMIN WITH MINERALS) TABS tablet Take 1 tablet by mouth daily.   omeprazole (PRILOSEC) 40 MG capsule Take 1 capsule (40 mg total) by mouth 2 (two) times daily before a meal.   No facility-administered encounter medications on file as of 01/30/2022.    Diagnostic Tests  Reviewed/Disposition: Reviewed  Consults: GI  Discharge Instructions: Discharged home on hospice  Disease/illness Education: Provided  Home Health/Community Services Discussions/Referrals: Referred to Hospice  Establishment or re-establishment of referral orders for community resources: NA  Discussion with other health care providers: NA  Assessment and Support of treatment regimen adherence: Provided during visit.  Appointments Coordinated with: Patient  Education for self-management, independent living, and ADLs: Provided  Relevant past medical, surgical, family and social history reviewed and updated as indicated. Interim medical history since our last visit reviewed. Allergies and medications reviewed and updated.  Review of Systems  Per HPI unless specifically indicated above     Objective:    LMP 01/14/2022 (Approximate)   Wt Readings from Last 3 Encounters:  01/15/22 150 lb (68 kg)  10/01/21 145 lb (65.8 kg)  09/03/21 153 lb 12.8 oz (69.8 kg)    Physical Exam  Results for orders placed or performed during the hospital encounter of 01/15/22  Blood culture (routine x 2)   Specimen: BLOOD LEFT ARM  Result Value Ref Range   Specimen Description BLOOD LEFT ARM    Special Requests      BOTTLES DRAWN AEROBIC AND ANAEROBIC Blood Culture adequate volume   Culture      NO GROWTH 5 DAYS Performed at [facility], [address] [place], [place] [address]    Report Status 01/20/2022 FINAL   Blood culture (routine x 2)   Specimen: Left Antecubital; Blood  Result Value Ref Range   Specimen Description LEFT ANTECUBITAL    Special Requests      BOTTLES DRAWN AEROBIC AND ANAEROBIC Blood Culture results may not be optimal due to an excessive volume of blood received in culture bottles   Culture      NO GROWTH 5 DAYS Performed at [facility], [address] [place], [place] [address]    Report Status 01/20/2022 FINAL   SARS Coronavirus 2 by RT  PCR (hospital order, performed in [place] hospital lab) *cepheid single result test* Anterior Nasal Swab   Specimen: Anterior Nasal Swab  Result Value Ref Range   SARS Coronavirus 2 by RT PCR NEGATIVE NEGATIVE  Lipase, blood  Result Value Ref Range   Lipase 32 11 - 51 U/L  Comprehensive metabolic panel  Result Value Ref Range   Sodium 132 (L) 135 - 145 mmol/L   Potassium 2.6 (LL) 3.5 - 5.1 mmol/L   Chloride 91 (L) 98 - 111 mmol/L   CO2 25 22 - 32 mmol/L   Glucose, Bld 112 (H) 70 - 99 mg/dL   BUN <5 (L) 6 - 20 mg/dL   Creatinine, Ser 0.33 (L) 0.44 - 1.00 mg/dL   Calcium 7.4 (L) 8.9 - 10.3 mg/dL   Total Protein 7.2 6.5 - 8.1 g/dL   Albumin 2.5 (L) 3.5 - 5.0 g/dL   AST 173 (H) 15 - 41 U/L   ALT 61 (H) 0 - 44 U/L   Alkaline Phosphatase 122 38 - 126 U/L   Total Bilirubin  20.5 (HH) 0.3 - 1.2 mg/dL   GFR, Estimated >60 >60 mL/min   Anion gap 16 (H) 5 - 15  CBC  Result Value Ref Range   WBC 6.9 4.0 - 10.5 K/uL   RBC 3.93 3.87 - 5.11 MIL/uL   Hemoglobin 13.7 12.0 - 15.0 g/dL   HCT 38.6 36.0 - 46.0 %   MCV 98.2 80.0 - 100.0 fL   MCH 34.9 (H) 26.0 - 34.0 pg   MCHC 35.5 30.0 - 36.0 g/dL   RDW 20.3 (H) 11.5 - 15.5 %   Platelets 84 (L) 150 - 400 K/uL   nRBC 0.0 0.0 - 0.2 %  Urinalysis, Routine w reflex microscopic Urine, Clean Catch  Result Value Ref Range   Color, Urine AMBER (A) YELLOW   APPearance HAZY (A) CLEAR   Specific Gravity, Urine 1.010 1.005 - 1.030   pH 6.0 5.0 - 8.0   Glucose, UA NEGATIVE NEGATIVE mg/dL   Hgb urine dipstick NEGATIVE NEGATIVE   Bilirubin Urine MODERATE (A) NEGATIVE   Ketones, ur NEGATIVE NEGATIVE mg/dL   Protein, ur NEGATIVE NEGATIVE mg/dL   Nitrite NEGATIVE NEGATIVE   Leukocytes,Ua NEGATIVE NEGATIVE  Ammonia  Result Value Ref Range   Ammonia 67 (H) 9 - 35 umol/L  Lactic acid, plasma  Result Value Ref Range   Lactic Acid, Venous 6.5 (HH) 0.5 - 1.9 mmol/L  Lactic acid, plasma  Result Value Ref Range   Lactic Acid, Venous 5.1 (HH) 0.5 - 1.9  mmol/L  Protime-INR  Result Value Ref Range   Prothrombin Time 20.4 (H) 11.4 - 15.2 seconds   INR 1.8 (H) 0.8 - 1.2  Cortisol-am, blood  Result Value Ref Range   Cortisol - AM 8.5 6.7 - 22.6 ug/dL  Procalcitonin  Result Value Ref Range   Procalcitonin 0.31 ng/mL  Comprehensive metabolic panel  Result Value Ref Range   Sodium 133 (L) 135 - 145 mmol/L   Potassium 3.5 3.5 - 5.1 mmol/L   Chloride 97 (L) 98 - 111 mmol/L   CO2 29 22 - 32 mmol/L   Glucose, Bld 115 (H) 70 - 99 mg/dL   BUN <5 (L) 6 - 20 mg/dL   Creatinine, Ser 0.33 (L) 0.44 - 1.00 mg/dL   Calcium 6.7 (L) 8.9 - 10.3 mg/dL   Total Protein 6.2 (L) 6.5 - 8.1 g/dL   Albumin 2.2 (L) 3.5 - 5.0 g/dL   AST 140 (H) 15 - 41 U/L   ALT 48 (H) 0 - 44 U/L   Alkaline Phosphatase 103 38 - 126 U/L   Total Bilirubin 18.2 (HH) 0.3 - 1.2 mg/dL   GFR, Estimated >60 >60 mL/min   Anion gap 7 5 - 15  Magnesium  Result Value Ref Range   Magnesium 1.4 (L) 1.7 - 2.4 mg/dL  Ammonia  Result Value Ref Range   Ammonia 82 (H) 9 - 35 umol/L  Bilirubin, direct  Result Value Ref Range   Bilirubin, Direct 9.6 (H) 0.0 - 0.2 mg/dL  Hepatitis B DNA, ultraquantitative, PCR  Result Value Ref Range   HBV DNA SERPL PCR-ACNC HBV DNA not detected IU/mL   HBV DNA SERPL PCR-LOG IU UNABLE TO CALCULATE log10 IU/mL   Test Info: Comment   HCV RNA Diagnosis, NAA  Result Value Ref Range   HCV RNA, Quantitation HCV Not Detected IU/mL   Test Information: Comment   AFP tumor marker  Result Value Ref Range   AFP, Serum, Tumor Marker 2.1 0.0 - 6.4 ng/mL  Acetaminophen level  Result Value Ref Range   Acetaminophen (Tylenol), Serum 15 10 - 30 ug/mL  Salicylate level  Result Value Ref Range   Salicylate Lvl <9.2 (L) 7.0 - 30.0 mg/dL  Ammonia  Result Value Ref Range   Ammonia 83 (H) 9 - 35 umol/L  Acetaminophen level  Result Value Ref Range   Acetaminophen (Tylenol), Serum <10 (L) 10 - 30 ug/mL  CBC  Result Value Ref Range   WBC 5.1 4.0 - 10.5 K/uL   RBC  3.23 (L) 3.87 - 5.11 MIL/uL   Hemoglobin 11.6 (L) 12.0 - 15.0 g/dL   HCT 31.4 (L) 36.0 - 46.0 %   MCV 97.2 80.0 - 100.0 fL   MCH 35.9 (H) 26.0 - 34.0 pg   MCHC 36.9 (H) 30.0 - 36.0 g/dL   RDW 20.6 (H) 11.5 - 15.5 %   Platelets 70 (L) 150 - 400 K/uL   nRBC 0.0 0.0 - 0.2 %  Comprehensive metabolic panel  Result Value Ref Range   Sodium 132 (L) 135 - 145 mmol/L   Potassium 2.7 (LL) 3.5 - 5.1 mmol/L   Chloride 95 (L) 98 - 111 mmol/L   CO2 28 22 - 32 mmol/L   Glucose, Bld 137 (H) 70 - 99 mg/dL   BUN <5 (L) 6 - 20 mg/dL   Creatinine, Ser <0.30 (L) 0.44 - 1.00 mg/dL   Calcium 6.7 (L) 8.9 - 10.3 mg/dL   Total Protein 6.0 (L) 6.5 - 8.1 g/dL   Albumin 2.0 (L) 3.5 - 5.0 g/dL   AST 108 (H) 15 - 41 U/L   ALT 45 (H) 0 - 44 U/L   Alkaline Phosphatase 86 38 - 126 U/L   Total Bilirubin 17.8 (H) 0.3 - 1.2 mg/dL   GFR, Estimated NOT CALCULATED >60 mL/min   Anion gap 9 5 - 15  Magnesium  Result Value Ref Range   Magnesium 2.4 1.7 - 2.4 mg/dL  Ammonia  Result Value Ref Range   Ammonia 78 (H) 9 - 35 umol/L  CBC  Result Value Ref Range   WBC 5.5 4.0 - 10.5 K/uL   RBC 3.36 (L) 3.87 - 5.11 MIL/uL   Hemoglobin 12.0 12.0 - 15.0 g/dL   HCT 33.4 (L) 36.0 - 46.0 %   MCV 99.4 80.0 - 100.0 fL   MCH 35.7 (H) 26.0 - 34.0 pg   MCHC 35.9 30.0 - 36.0 g/dL   RDW 21.8 (H) 11.5 - 15.5 %   Platelets 68 (L) 150 - 400 K/uL   nRBC 0.0 0.0 - 0.2 %  Comprehensive metabolic panel  Result Value Ref Range   Sodium 134 (L) 135 - 145 mmol/L   Potassium 2.8 (L) 3.5 - 5.1 mmol/L   Chloride 99 98 - 111 mmol/L   CO2 28 22 - 32 mmol/L   Glucose, Bld 140 (H) 70 - 99 mg/dL   BUN <5 (L) 6 - 20 mg/dL   Creatinine, Ser <0.30 (L) 0.44 - 1.00 mg/dL   Calcium 6.4 (LL) 8.9 - 10.3 mg/dL   Total Protein 5.7 (L) 6.5 - 8.1 g/dL   Albumin 1.9 (L) 3.5 - 5.0 g/dL   AST 97 (H) 15 - 41 U/L   ALT 36 0 - 44 U/L   Alkaline Phosphatase 87 38 - 126 U/L   Total Bilirubin 16.9 (H) 0.3 - 1.2 mg/dL   GFR, Estimated NOT CALCULATED >60  mL/min   Anion gap 7 5 - 15  Potassium  Result Value  Ref Range   Potassium 3.1 (L) 3.5 - 5.1 mmol/L  Ammonia  Result Value Ref Range   Ammonia 107 (H) 9 - 35 umol/L  CBC  Result Value Ref Range   WBC 6.4 4.0 - 10.5 K/uL   RBC 3.52 (L) 3.87 - 5.11 MIL/uL   Hemoglobin 12.6 12.0 - 15.0 g/dL   HCT 35.5 (L) 36.0 - 46.0 %   MCV 100.9 (H) 80.0 - 100.0 fL   MCH 35.8 (H) 26.0 - 34.0 pg   MCHC 35.5 30.0 - 36.0 g/dL   RDW 22.8 (H) 11.5 - 15.5 %   Platelets 74 (L) 150 - 400 K/uL   nRBC 0.0 0.0 - 0.2 %  Comprehensive metabolic panel  Result Value Ref Range   Sodium 133 (L) 135 - 145 mmol/L   Potassium 3.3 (L) 3.5 - 5.1 mmol/L   Chloride 100 98 - 111 mmol/L   CO2 27 22 - 32 mmol/L   Glucose, Bld 120 (H) 70 - 99 mg/dL   BUN 6 6 - 20 mg/dL   Creatinine, Ser <0.30 (L) 0.44 - 1.00 mg/dL   Calcium 6.6 (L) 8.9 - 10.3 mg/dL   Total Protein 6.0 (L) 6.5 - 8.1 g/dL   Albumin 2.0 (L) 3.5 - 5.0 g/dL   AST 100 (H) 15 - 41 U/L   ALT 37 0 - 44 U/L   Alkaline Phosphatase 101 38 - 126 U/L   Total Bilirubin 15.4 (H) 0.3 - 1.2 mg/dL   GFR, Estimated NOT CALCULATED >60 mL/min   Anion gap 6 5 - 15  CBC  Result Value Ref Range   WBC 6.8 4.0 - 10.5 K/uL   RBC 3.39 (L) 3.87 - 5.11 MIL/uL   Hemoglobin 12.0 12.0 - 15.0 g/dL   HCT 34.4 (L) 36.0 - 46.0 %   MCV 101.5 (H) 80.0 - 100.0 fL   MCH 35.4 (H) 26.0 - 34.0 pg   MCHC 34.9 30.0 - 36.0 g/dL   RDW 22.3 (H) 11.5 - 15.5 %   Platelets 80 (L) 150 - 400 K/uL   nRBC 0.0 0.0 - 0.2 %  Comprehensive metabolic panel  Result Value Ref Range   Sodium 131 (L) 135 - 145 mmol/L   Potassium 3.6 3.5 - 5.1 mmol/L   Chloride 100 98 - 111 mmol/L   CO2 23 22 - 32 mmol/L   Glucose, Bld 99 70 - 99 mg/dL   BUN 5 (L) 6 - 20 mg/dL   Creatinine, Ser <0.30 (L) 0.44 - 1.00 mg/dL   Calcium 6.6 (L) 8.9 - 10.3 mg/dL   Total Protein 5.9 (L) 6.5 - 8.1 g/dL   Albumin 2.0 (L) 3.5 - 5.0 g/dL   AST 93 (H) 15 - 41 U/L   ALT 34 0 - 44 U/L   Alkaline Phosphatase 100 38 - 126 U/L    Total Bilirubin 16.5 (H) 0.3 - 1.2 mg/dL   GFR, Estimated NOT CALCULATED >60 mL/min   Anion gap 8 5 - 15  CBC  Result Value Ref Range   WBC 5.8 4.0 - 10.5 K/uL   RBC 3.46 (L) 3.87 - 5.11 MIL/uL   Hemoglobin 12.3 12.0 - 15.0 g/dL   HCT 35.1 (L) 36.0 - 46.0 %   MCV 101.4 (H) 80.0 - 100.0 fL   MCH 35.5 (H) 26.0 - 34.0 pg   MCHC 35.0 30.0 - 36.0 g/dL   RDW 21.7 (H) 11.5 - 15.5 %   Platelets 70 (L) 150 - 400 K/uL  nRBC 0.0 0.0 - 0.2 %  Comprehensive metabolic panel  Result Value Ref Range   Sodium 130 (L) 135 - 145 mmol/L   Potassium 3.4 (L) 3.5 - 5.1 mmol/L   Chloride 100 98 - 111 mmol/L   CO2 24 22 - 32 mmol/L   Glucose, Bld 96 70 - 99 mg/dL   BUN 6 6 - 20 mg/dL   Creatinine, Ser <0.30 (L) 0.44 - 1.00 mg/dL   Calcium 6.9 (L) 8.9 - 10.3 mg/dL   Total Protein 5.7 (L) 6.5 - 8.1 g/dL   Albumin 1.9 (L) 3.5 - 5.0 g/dL   AST 94 (H) 15 - 41 U/L   ALT 31 0 - 44 U/L   Alkaline Phosphatase 98 38 - 126 U/L   Total Bilirubin 17.9 (H) 0.3 - 1.2 mg/dL   GFR, Estimated NOT CALCULATED >60 mL/min   Anion gap 6 5 - 15  Comprehensive metabolic panel  Result Value Ref Range   Sodium 131 (L) 135 - 145 mmol/L   Potassium 3.4 (L) 3.5 - 5.1 mmol/L   Chloride 101 98 - 111 mmol/L   CO2 22 22 - 32 mmol/L   Glucose, Bld 103 (H) 70 - 99 mg/dL   BUN 6 6 - 20 mg/dL   Creatinine, Ser <0.30 (L) 0.44 - 1.00 mg/dL   Calcium 7.5 (L) 8.9 - 10.3 mg/dL   Total Protein 5.9 (L) 6.5 - 8.1 g/dL   Albumin 2.0 (L) 3.5 - 5.0 g/dL   AST 106 (H) 15 - 41 U/L   ALT 30 0 - 44 U/L   Alkaline Phosphatase 99 38 - 126 U/L   Total Bilirubin 19.5 (HH) 0.3 - 1.2 mg/dL   GFR, Estimated NOT CALCULATED >60 mL/min   Anion gap 8 5 - 15  THC,MS,WB/Sp Rfx  Result Value Ref Range   Cannabinoid Confirmation Positive    Tetrahydrocannabinol(THC) Comment ng/mL   Carboxy-THC Comment ng/mL   Hydroxy-THC Comment ng/mL   Cannabidiol 2.5 ng/mL  POC urine preg, ED  Result Value Ref Range   Preg Test, Ur NEGATIVE NEGATIVE  Troponin I  (High Sensitivity)  Result Value Ref Range   Troponin I (High Sensitivity) 10 <18 ng/L  Troponin I (High Sensitivity)  Result Value Ref Range   Troponin I (High Sensitivity) 9 <18 ng/L      Assessment & Plan:   Problem List Items Addressed This Visit   None    Follow up plan: No follow-ups on file.

## 2022-02-02 LAB — OXYCODONES,MS,WB/SP RFX
Oxycocone: NEGATIVE ng/mL
Oxycodones Confirmation: NEGATIVE
Oxymorphone: NEGATIVE ng/mL

## 2022-02-02 LAB — DRUG SCREEN 10 W/CONF, SERUM
Amphetamines, IA: NEGATIVE ng/mL
Barbiturates, IA: NEGATIVE ug/mL
Benzodiazepines, IA: NEGATIVE ng/mL
Cocaine & Metabolite, IA: NEGATIVE ng/mL
Methadone, IA: NEGATIVE ng/mL
Opiates, IA: POSITIVE ng/mL — AB
Oxycodones, IA: NEGATIVE ng/mL
Phencyclidine, IA: NEGATIVE ng/mL
Propoxyphene, IA: NEGATIVE ng/mL
THC(Marijuana) Metabolite, IA: POSITIVE ng/mL — AB

## 2022-02-02 LAB — OPIATES,MS,WB/SP RFX
6-Acetylmorphine: NEGATIVE
Codeine: NEGATIVE ng/mL
Dihydrocodeine: 6.6 ng/mL
Hydrocodone: 39.7 ng/mL
Hydromorphone: 3.3 ng/mL
Morphine: NEGATIVE ng/mL
Opiate Confirmation: POSITIVE

## 2022-02-02 NOTE — Progress Notes (Signed)
Appt cancelled

## 2022-02-03 ENCOUNTER — Institutional Professional Consult (permissible substitution): Payer: Medicaid Other | Admitting: Student in an Organized Health Care Education/Training Program

## 2022-02-09 DIAGNOSIS — K7031 Alcoholic cirrhosis of liver with ascites: Secondary | ICD-10-CM | POA: Diagnosis not present

## 2022-02-10 DIAGNOSIS — K7031 Alcoholic cirrhosis of liver with ascites: Secondary | ICD-10-CM | POA: Diagnosis not present

## 2022-02-11 DIAGNOSIS — K7031 Alcoholic cirrhosis of liver with ascites: Secondary | ICD-10-CM | POA: Diagnosis not present

## 2022-02-12 DIAGNOSIS — K7031 Alcoholic cirrhosis of liver with ascites: Secondary | ICD-10-CM | POA: Diagnosis not present

## 2022-02-13 DIAGNOSIS — K7031 Alcoholic cirrhosis of liver with ascites: Secondary | ICD-10-CM | POA: Diagnosis not present

## 2022-02-14 DIAGNOSIS — K7031 Alcoholic cirrhosis of liver with ascites: Secondary | ICD-10-CM | POA: Diagnosis not present

## 2022-02-15 DIAGNOSIS — K7031 Alcoholic cirrhosis of liver with ascites: Secondary | ICD-10-CM | POA: Diagnosis not present

## 2022-02-16 DIAGNOSIS — K7031 Alcoholic cirrhosis of liver with ascites: Secondary | ICD-10-CM | POA: Diagnosis not present

## 2022-02-17 DIAGNOSIS — K7031 Alcoholic cirrhosis of liver with ascites: Secondary | ICD-10-CM | POA: Diagnosis not present

## 2022-02-18 DIAGNOSIS — K7031 Alcoholic cirrhosis of liver with ascites: Secondary | ICD-10-CM | POA: Diagnosis not present

## 2022-02-19 DIAGNOSIS — K7031 Alcoholic cirrhosis of liver with ascites: Secondary | ICD-10-CM | POA: Diagnosis not present

## 2022-03-04 ENCOUNTER — Other Ambulatory Visit: Payer: Self-pay

## 2022-03-04 DIAGNOSIS — D696 Thrombocytopenia, unspecified: Secondary | ICD-10-CM

## 2022-03-05 ENCOUNTER — Other Ambulatory Visit: Payer: Medicaid Other

## 2022-03-05 ENCOUNTER — Inpatient Hospital Stay: Payer: Medicaid Other | Admitting: Oncology

## 2022-03-05 ENCOUNTER — Inpatient Hospital Stay: Payer: Medicaid Other | Attending: Internal Medicine

## 2022-03-05 ENCOUNTER — Ambulatory Visit: Payer: Medicaid Other | Admitting: Oncology

## 2022-03-08 DEATH — deceased

## 2022-04-06 ENCOUNTER — Encounter (INDEPENDENT_AMBULATORY_CARE_PROVIDER_SITE_OTHER): Payer: Self-pay
